# Patient Record
Sex: Male | Born: 1940
Health system: Southern US, Community
[De-identification: ages and names within clinical notes are randomized; demographics above are authoritative.]

## PROBLEM LIST (undated history)

## (undated) DIAGNOSIS — E876 Hypokalemia: Secondary | ICD-10-CM

## (undated) DIAGNOSIS — I119 Hypertensive heart disease without heart failure: Secondary | ICD-10-CM

## (undated) DIAGNOSIS — I3139 Other pericardial effusion (noninflammatory): Secondary | ICD-10-CM

## (undated) DIAGNOSIS — Z9289 Personal history of other medical treatment: Secondary | ICD-10-CM

## (undated) DIAGNOSIS — I160 Hypertensive urgency: Secondary | ICD-10-CM

## (undated) DIAGNOSIS — I639 Cerebral infarction, unspecified: Secondary | ICD-10-CM

## (undated) DIAGNOSIS — R42 Dizziness and giddiness: Secondary | ICD-10-CM

## (undated) DIAGNOSIS — K5730 Diverticulosis of large intestine without perforation or abscess without bleeding: Secondary | ICD-10-CM

## (undated) DIAGNOSIS — Z951 Presence of aortocoronary bypass graft: Secondary | ICD-10-CM

## (undated) DIAGNOSIS — R55 Syncope and collapse: Secondary | ICD-10-CM

## (undated) DIAGNOSIS — R079 Chest pain, unspecified: Secondary | ICD-10-CM

## (undated) DIAGNOSIS — E785 Hyperlipidemia, unspecified: Secondary | ICD-10-CM

## (undated) DIAGNOSIS — I10 Essential (primary) hypertension: Secondary | ICD-10-CM

## (undated) DIAGNOSIS — I214 Non-ST elevation (NSTEMI) myocardial infarction: Secondary | ICD-10-CM

## (undated) DIAGNOSIS — D45 Polycythemia vera: Secondary | ICD-10-CM

## (undated) DIAGNOSIS — K648 Other hemorrhoids: Secondary | ICD-10-CM

## (undated) DIAGNOSIS — I313 Pericardial effusion (noninflammatory): Secondary | ICD-10-CM

## (undated) DIAGNOSIS — H532 Diplopia: Secondary | ICD-10-CM

## (undated) DIAGNOSIS — I2510 Atherosclerotic heart disease of native coronary artery without angina pectoris: Secondary | ICD-10-CM

## (undated) DIAGNOSIS — H538 Other visual disturbances: Secondary | ICD-10-CM

## (undated) HISTORY — DX: Chest pain, unspecified: R07.9

## (undated) HISTORY — DX: Personal history of other medical treatment: Z92.89

## (undated) HISTORY — DX: Other hemorrhoids: K64.8

## (undated) HISTORY — DX: Hypertensive urgency: I16.0

## (undated) HISTORY — DX: Diplopia: H53.2

## (undated) HISTORY — DX: Atherosclerotic heart disease of native coronary artery without angina pectoris: I25.10

## (undated) HISTORY — DX: Hypokalemia: E87.6

## (undated) HISTORY — DX: Hyperlipidemia, unspecified: E78.5

## (undated) HISTORY — DX: Syncope and collapse: R55

## (undated) HISTORY — DX: Non-ST elevation (NSTEMI) myocardial infarction: I21.4

## (undated) HISTORY — DX: Hypertensive heart disease without heart failure: I11.9

## (undated) HISTORY — DX: Other visual disturbances: H53.8

## (undated) HISTORY — DX: Diverticulosis of large intestine without perforation or abscess without bleeding: K57.30

## (undated) HISTORY — DX: Presence of aortocoronary bypass graft: Z95.1

## (undated) HISTORY — DX: Other pericardial effusion (noninflammatory): I31.39

## (undated) HISTORY — DX: Pericardial effusion (noninflammatory): I31.3

## (undated) HISTORY — DX: Essential (primary) hypertension: I10

## (undated) HISTORY — DX: Cerebral infarction, unspecified: I63.9

## (undated) HISTORY — PX: CORONARY STENT PLACEMENT: SHX1402

## (undated) HISTORY — DX: Polycythemia vera: D45

## (undated) HISTORY — DX: Dizziness and giddiness: R42

---

## 2003-06-08 ENCOUNTER — Emergency Department (HOSPITAL_COMMUNITY): Admission: EM | Admit: 2003-06-08 | Discharge: 2003-06-08 | Payer: Self-pay | Admitting: *Deleted

## 2006-04-02 ENCOUNTER — Ambulatory Visit: Payer: Self-pay | Admitting: Cardiovascular Disease

## 2006-04-02 ENCOUNTER — Inpatient Hospital Stay (HOSPITAL_COMMUNITY): Admission: EM | Admit: 2006-04-02 | Discharge: 2006-04-05 | Payer: Self-pay | Admitting: Emergency Medicine

## 2006-04-22 ENCOUNTER — Ambulatory Visit: Payer: Self-pay | Admitting: Cardiovascular Disease

## 2007-01-21 ENCOUNTER — Emergency Department (HOSPITAL_COMMUNITY): Admission: EM | Admit: 2007-01-21 | Discharge: 2007-01-22 | Payer: Self-pay | Admitting: Emergency Medicine

## 2008-06-14 DIAGNOSIS — I2583 Coronary atherosclerosis due to lipid rich plaque: Secondary | ICD-10-CM

## 2008-06-14 DIAGNOSIS — I119 Hypertensive heart disease without heart failure: Secondary | ICD-10-CM

## 2008-06-14 DIAGNOSIS — I25118 Atherosclerotic heart disease of native coronary artery with other forms of angina pectoris: Secondary | ICD-10-CM | POA: Insufficient documentation

## 2008-06-14 DIAGNOSIS — I2510 Atherosclerotic heart disease of native coronary artery without angina pectoris: Secondary | ICD-10-CM

## 2008-06-14 DIAGNOSIS — E785 Hyperlipidemia, unspecified: Secondary | ICD-10-CM | POA: Insufficient documentation

## 2008-06-14 HISTORY — DX: Hypertensive heart disease without heart failure: I11.9

## 2008-06-14 HISTORY — DX: Hyperlipidemia, unspecified: E78.5

## 2008-06-14 HISTORY — DX: Atherosclerotic heart disease of native coronary artery without angina pectoris: I25.10

## 2008-06-15 ENCOUNTER — Ambulatory Visit: Payer: Self-pay | Admitting: Cardiovascular Disease

## 2008-06-30 ENCOUNTER — Ambulatory Visit: Payer: Self-pay | Admitting: Cardiovascular Disease

## 2008-07-08 ENCOUNTER — Telehealth: Payer: Self-pay | Admitting: Cardiovascular Disease

## 2008-07-08 LAB — CONVERTED CEMR LAB
Albumin: 3.6 g/dL (ref 3.5–5.2)
CO2: 31 meq/L (ref 19–32)
Calcium: 9.1 mg/dL (ref 8.4–10.5)
Creatinine, Ser: 0.8 mg/dL (ref 0.4–1.5)
Direct LDL: 150.8 mg/dL
GFR calc non Af Amer: 102.75 mL/min (ref >60)
Glucose, Bld: 109 mg/dL — ABNORMAL HIGH (ref 70–99)
HDL: 41.9 mg/dL (ref >39.00)
Potassium: 3.8 meq/L (ref 3.5–5.1)
Sodium: 142 meq/L (ref 135–145)
Total Bilirubin: 1.1 mg/dL (ref 0.3–1.2)
Total CHOL/HDL Ratio: 5
Total Protein: 6.8 g/dL (ref 6.0–8.3)

## 2008-08-18 ENCOUNTER — Ambulatory Visit: Payer: Self-pay | Admitting: Cardiovascular Disease

## 2008-10-11 ENCOUNTER — Ambulatory Visit: Payer: Self-pay | Admitting: Cardiovascular Disease

## 2008-10-13 LAB — CONVERTED CEMR LAB
ALT: 17 units/L (ref 0–53)
Alkaline Phosphatase: 66 units/L (ref 39–117)
Bilirubin, Direct: 0 mg/dL (ref 0.0–0.3)
HDL: 38.9 mg/dL — ABNORMAL LOW (ref >39.00)
LDL Cholesterol: 121 mg/dL — ABNORMAL HIGH (ref 0–99)

## 2009-04-12 ENCOUNTER — Ambulatory Visit: Payer: Self-pay | Admitting: Cardiovascular Disease

## 2009-04-13 ENCOUNTER — Ambulatory Visit: Payer: Self-pay | Admitting: Cardiovascular Disease

## 2009-04-20 LAB — CONVERTED CEMR LAB
AST: 16 units/L (ref 0–37)
Albumin: 3.6 g/dL (ref 3.5–5.2)
Alkaline Phosphatase: 65 units/L (ref 39–117)
Cholesterol: 175 mg/dL (ref 0–200)
Glucose, Bld: 105 mg/dL — ABNORMAL HIGH (ref 70–99)
Total Bilirubin: 0.7 mg/dL (ref 0.3–1.2)
Total CHOL/HDL Ratio: 4
Triglycerides: 97 mg/dL (ref 0.0–149.0)
VLDL: 19.4 mg/dL (ref 0.0–40.0)

## 2009-04-25 ENCOUNTER — Telehealth: Payer: Self-pay | Admitting: Cardiovascular Disease

## 2009-09-29 ENCOUNTER — Ambulatory Visit: Payer: Self-pay | Admitting: Cardiovascular Disease

## 2009-09-30 ENCOUNTER — Ambulatory Visit: Payer: Self-pay | Admitting: Cardiovascular Disease

## 2009-10-17 LAB — CONVERTED CEMR LAB
ALT: 15 units/L (ref 0–53)
AST: 17 units/L (ref 0–37)
Alkaline Phosphatase: 65 units/L (ref 39–117)
Cholesterol: 188 mg/dL (ref 0–200)
Total Bilirubin: 1.1 mg/dL (ref 0.3–1.2)
Total CHOL/HDL Ratio: 4
Total Protein: 6.6 g/dL (ref 6.0–8.3)
VLDL: 15.2 mg/dL (ref 0.0–40.0)

## 2009-11-06 ENCOUNTER — Ambulatory Visit: Payer: Self-pay | Admitting: Cardiology

## 2009-11-06 ENCOUNTER — Observation Stay (HOSPITAL_COMMUNITY): Admission: EM | Admit: 2009-11-06 | Discharge: 2009-11-07 | Payer: Self-pay | Admitting: Emergency Medicine

## 2009-11-06 ENCOUNTER — Encounter: Payer: Self-pay | Admitting: Cardiovascular Disease

## 2009-12-05 ENCOUNTER — Ambulatory Visit: Payer: Self-pay | Admitting: Cardiovascular Disease

## 2010-01-11 ENCOUNTER — Ambulatory Visit: Payer: Self-pay | Admitting: Cardiovascular Disease

## 2010-03-21 NOTE — Assessment & Plan Note (Signed)
Summary: [facility]   Visit Type:  Follow-up Primary Provider:  none  CC:  No complaints.  History of Present Illness: This is a 70 year old gentleman with history of coronary artery disease presenting for follow-up evaluation. He has three-vessel CAD with moderate diffuse LAD stenosis, small vessel disease in the right coronary artery, and severe stenosis in the left circumflex that was treated with bare-metal stents in 2009. His LVEF is preserved at 70% at time of last assessment.  He presented for an exercise treadmill study last month, but was having a lot of back pain at the time and his BP was markedly elevated so the test was cancelled. He has subsequently done well and is feeling much better at present. He denies chest pain, dyspnea, or edema. His BP has been good at home...he reports readings in the 130/70's range.    Current Medications (verified): 1)  Aspirin 81 Mg Tbec (Aspirin) .... Take One Tablet  Three Times A Week 2)  Micardis Hct 80-25 Mg Tabs (Telmisartan-Hctz) .... Take 1 Tablet By Mouth Once A Day 3)  Garlic Oil 500 Mg Caps (Garlic) .... Take 1 Capsule By Mouth Once A Day  Allergies (verified): No Known Drug Allergies  Past History:  Past medical history reviewed for relevance to current acute and chronic problems.  Past Medical History: Reviewed history from 09/29/2009 and no changes required. CAD, UNSPECIFIED SITE (ICD-414.00) - s/p PCI of the LCx - bare metal stent HYPERTENSION, UNSPECIFIED (ICD-401.9) HYPERLIPIDEMIA-MIXED (ICD-272.4)    Review of Systems       Negative except as per HPI   Vital Signs:  Patient profile:   70 year old male Height:      66 inches Weight:      174.50 pounds BMI:     28.27 Pulse rate:   68 / minute Pulse rhythm:   regular Resp:     18 per minute BP sitting:   132 / 90  (left arm) Cuff size:   large  Vitals Entered By: Vikki Ports (January 11, 2010 3:13 PM)  Physical Exam  General:  Pt is alert and  oriented, in no acute distress. HEENT: normal Neck: normal carotid upstrokes without bruits, JVP normal Lungs: CTA CV: RRR without murmur or gallop Abd: soft, NT, positive BS, no bruit, no organomegaly Ext: no clubbing, cyanosis, or edema. peripheral pulses 2+ and equal Skin: warm and dry without rash    EKG  Procedure date:  12/05/2009  Findings:      NSR 85 bpm, within normal limits.  Impression & Recommendations:  Problem # 1:  CAD, UNSPECIFIED SITE (ICD-414.00) The patient is stable without angina. He is able to exert himself without symptoms and does not wish to go forward with stress testing at this point.Marland KitchenMarland KitchenI agree that is reasonable.   His updated medication list for this problem includes:    Aspirin 81 Mg Tbec (Aspirin) .Marland Kitchen... Take one tablet  three times a week  Problem # 2:  HYPERTENSION, UNSPECIFIED (ICD-401.9)  Continue current meds.   His updated medication list for this problem includes:    Aspirin 81 Mg Tbec (Aspirin) .Marland Kitchen... Take one tablet  three times a week    Micardis Hct 80-25 Mg Tabs (Telmisartan-hctz) .Marland Kitchen... Take 1 tablet by mouth once a day  BP today: 132/90 Prior BP: 148/97 (09/29/2009)  Prior 10 Yr Risk Heart Disease: N/A (12/05/2009)  Labs Reviewed: K+: 3.8 (04/13/2009) Creat: : 0.9 (04/13/2009)   Chol: 188 (09/30/2009)   HDL: 44.50 (09/30/2009)  LDL: 128 (09/30/2009)   TG: 76.0 (09/30/2009)  Problem # 3:  HYPERLIPIDEMIA-MIXED (ICD-272.4) He has refused to take statins in the past. I had a long discussion with him today about the overall favorable risk:benefit for statin therapy in his situation with known CAD, HTN, etc. He will think things over but still wouldn't commit to taking a statin.   CHOL: 188 (09/30/2009)   LDL: 128 (09/30/2009)   HDL: 44.50 (09/30/2009)   TG: 76.0 (09/30/2009)  Patient Instructions: 1)  Your physician recommends that you continue on your current medications as directed. Please refer to the Current Medication list  given to you today. 2)  Your physician wants you to follow-up in: 6 MONTHS.  You will receive a reminder letter in the mail two months in advance. If you don't receive a letter, please call our office to schedule the follow-up appointment.

## 2010-03-21 NOTE — Assessment & Plan Note (Signed)
Summary: f80m   Visit Type:  6 months follow up Primary Provider:  none  CC:  No complaints.  History of Present Illness: This is a 70 year old gentleman with history of coronary artery disease presenting for follow-up evaluation. He has three-vessel CAD with moderate diffuse LAD stenosis, small vessel disease in the right coronary artery, and severe stenosis in the left circumflex that was treated with bare-metal stents in 2009. His LVEF is preserved at 70% at time of last assessment.  The patient continues to feel very good. He has no complaints today. He has lost about 20 pounds over the past year through diet. He is active but doesn't participate in regular exercise. The patient denies chest pain, dyspnea, orthopnea, PND, edema, palpitations, lightheadedness, or syncope.     Current Medications (verified): 1)  Aspirin 81 Mg Tbec (Aspirin) .... Take One Tablet  Three Times A Week 2)  Micardis Hct 80-25 Mg Tabs (Telmisartan-Hctz) .... Take 1 Tablet By Mouth Once A Day 3)  Garlic Oil 500 Mg Caps (Garlic) .... Take 1 Capsule By Mouth Once A Day  Allergies (verified): No Known Drug Allergies  Past History:  Past medical history reviewed for relevance to current acute and chronic problems.  Past Medical History: CAD, UNSPECIFIED SITE (ICD-414.00) - s/p PCI of the LCx - bare metal stent HYPERTENSION, UNSPECIFIED (ICD-401.9) HYPERLIPIDEMIA-MIXED (ICD-272.4)    Review of Systems       Negative except as per HPI   Vital Signs:  Patient profile:   70 year old male Height:      66 inches Weight:      172.50 pounds BMI:     27.94 Pulse rate:   68 / minute Pulse rhythm:   regular Resp:     18 per minute BP sitting:   148 / 97  (left arm) Cuff size:   large  Vitals Entered By: Vikki Ports (September 29, 2009 4:15 PM)  Physical Exam  General:  Pt is alert and oriented, in no acute distress. HEENT: normal Neck: normal carotid upstrokes without bruits, JVP normal Lungs:  CTA CV: RRR without murmur or gallop Abd: soft, NT, positive BS, no bruit, no organomegaly Ext: no clubbing, cyanosis, or edema. peripheral pulses 2+ and equal Skin: warm and dry without rash    EKG  Procedure date:  09/29/2009  Findings:      NSR 69 bpm, within normal limits.  Impression & Recommendations:  Problem # 1:  CAD, UNSPECIFIED SITE (ICD-414.00) Stable without angina. Plan exercise treadmill stress test in 6 months for follow-up. Continue current medical therapy.  His updated medication list for this problem includes:    Aspirin 81 Mg Tbec (Aspirin) .Marland Kitchen... Take one tablet  three times a week  Orders: EKG w/ Interpretation (93000)  Problem # 2:  HYPERTENSION, UNSPECIFIED (ICD-401.9) BP elevated today, but he regularly checks this at home and reports BP range 120's / 70's.  His updated medication list for this problem includes:    Aspirin 81 Mg Tbec (Aspirin) .Marland Kitchen... Take one tablet  three times a week    Micardis Hct 80-25 Mg Tabs (Telmisartan-hctz) .Marland Kitchen... Take 1 tablet by mouth once a day  Orders: EKG w/ Interpretation (93000)  BP today: 148/97 Prior BP: 164/100 (04/12/2009)  Labs Reviewed: K+: 3.8 (04/13/2009) Creat: : 0.9 (04/13/2009)   Chol: 175 (04/13/2009)   HDL: 47.80 (04/13/2009)   LDL: 108 (04/13/2009)   TG: 97.0 (04/13/2009)  Problem # 3:  HYPERLIPIDEMIA-MIXED (ICD-272.4) Unwilling to take statin drugs.  Discussed this with him again today. Follow-up lipids and lft's.  The following medications were removed from the medication list:    Simvastatin 20 Mg Tabs (Simvastatin) .Marland Kitchen... Take one tablet by mouth daily at bedtime  Orders: EKG w/ Interpretation (93000)  CHOL: 175 (04/13/2009)   LDL: 108 (04/13/2009)   HDL: 47.80 (04/13/2009)   TG: 97.0 (04/13/2009)  Patient Instructions: 1)  Your physician recommends that you return for a FASTING LIPID and LIVER Profile (414.01, 401.9)  Nothing to eat or drink after midnight--lab opens at 8:30  2)  Your  physician recommends that you continue on your current medications as directed. Please refer to the Current Medication list given to you today. 3)  Your physician has requested that you have an exercise tolerance test in 6 MONTHS.  For further information please visit [email or web address]  Please also follow instruction sheet, as given.

## 2010-03-21 NOTE — Consult Note (Signed)
Summary: [facility]   [facility]   Imported By: Roderic Ovens 12/06/2009 14:59:03  _____________________________________________________________________  External Attachment:    Type:   Image     Comment:   External Document

## 2010-03-21 NOTE — Assessment & Plan Note (Signed)
Summary: f19m   Visit Type:  6 months follow up Primary Provider:  none  CC:  High blood pressure.  History of Present Illness: This is a 70 -year-oldl gentleman with history of coronary artery disease presenting for follow-up evaluation. He has three-vessel CAD with moderate diffuse LAD stenosis, small vessel disease in the right coronary artery, and severe stenosis in the left circumflex that was treated with bare-metal stents in 2009. His LVEF is preserved at 70% at time of last assessment.  The pt is doing well - he has no complaints today. The patient denies chest pain, dyspnea, orthopnea, PND, edema, palpitations, lightheadedness, or syncope.  He has been somewhat resistant to taking medications for both BP and cholesterol.     Current Medications (verified): 1)  Aspirin 81 Mg Tbec (Aspirin) .... Take One Tablet  Three Times A Week 2)  Micardis Hct 80-25 Mg Tabs (Telmisartan-Hctz) .... Take 1 Tablet By Mouth Once A Day 3)  Garlic Oil 500 Mg Caps (Garlic) .... Take 1 Capsule By Mouth Once A Day  Allergies (verified): No Known Drug Allergies  Past History:  Past medical history reviewed for relevance to current acute and chronic problems.  Past Medical History: CAD, UNSPECIFIED SITE (ICD-414.00) - s/p PCI of the LCx HYPERTENSION, UNSPECIFIED (ICD-401.9) HYPERLIPIDEMIA-MIXED (ICD-272.4)    Review of Systems       Negative except as per HPI   Vital Signs:  Patient profile:   70 year old male Height:      66 inches Weight:      170.75 pounds BMI:     27.66 Pulse rate:   70 / minute Pulse rhythm:   regular Resp:     18 per minute BP sitting:   164 / 100  (left arm) Cuff size:   large  Vitals Entered By: Vikki Ports (April 12, 2009 3:19 PM)  Physical Exam  General:  Pt is alert and oriented, in no acute distress. HEENT: normal Neck: normal carotid upstrokes without bruits, JVP normal Lungs: CTA CV: RRR without murmur or gallop Abd: soft, NT, positive  BS, no bruit, no organomegaly Ext: no clubbing, cyanosis, or edema. peripheral pulses 2+ and equal Skin: warm and dry without rash    EKG  Procedure date:  04/12/2009  Findings:      NSR, HR 70 bpm, within normal limits  Impression & Recommendations:  Problem # 1:  CAD, UNSPECIFIED SITE (ICD-414.00) Stable without angina. Long discussoin regarding the need to control modifiable risk factors of HTN, dyslipidemia, but this did not make much of an impact. He feels well and doesn't seem to comprehend the importance of secondary risk reduction.  His updated medication list for this problem includes:    Aspirin 81 Mg Tbec (Aspirin) .Marland Kitchen... Take one tablet  three times a week  Orders: EKG w/ Interpretation (93000)  Problem # 2:  HYPERTENSION, UNSPECIFIED (ICD-401.9) Uncontrolled. As above - advised escalation of antihypertensive Rx, but he was not inclined to do this.  His updated medication list for this problem includes:    Aspirin 81 Mg Tbec (Aspirin) .Marland Kitchen... Take one tablet  three times a week    Micardis Hct 80-25 Mg Tabs (Telmisartan-hctz) .Marland Kitchen... Take 1 tablet by mouth once a day  Orders: EKG w/ Interpretation (93000)  BP today: 164/100 Prior BP: 144/90 (08/18/2008)  Labs Reviewed: K+: 3.8 (06/30/2008) Creat: : 0.8 (06/30/2008)   Chol: 174 (10/11/2008)   HDL: 38.90 (10/11/2008)   LDL: 121 (10/11/2008)   TG: 70.0 (  10/11/2008)  Problem # 3:  HYPERLIPIDEMIA-MIXED (ICD-272.4) Check lipids and LFT's - encourage a statin if lipids above goal.  Orders: EKG w/ Interpretation (93000)  Patient Instructions: 1)  Your physician recommends that you continue on your current medications as directed. Please refer to the Current Medication list given to you today. 2)  Your physician wants you to follow-up in:  6 MONTHS.  You will receive a reminder letter in the mail two months in advance. If you don't receive a letter, please call our office to schedule the follow-up appointment. 3)  Your  physician recommends that you return for a FASTING LIPID, LIVER and BMP (414.01, 272.0, 401.9)--Nothing to eat or drink after midnight

## 2010-03-21 NOTE — Progress Notes (Signed)
Summary: Lab Results  Phone Note Call from Patient Call back at Home Phone [phone]   Caller: Patient Reason for Call: Talk to Nurse, Lab or Test Results Initial call taken by: Lorne Skeens,  April 25, 2009 3:10 PM  Follow-up for Phone Call        Lab results reviewed with the pt.  The pt will start Simvastatin 20mg  once every evening.  The pt will have a lipid and liver rechecked on 07/25/09. Follow-up by: Julieta Gutting, RN, BSN,  April 25, 2009 3:40 PM    New/Updated Medications: SIMVASTATIN 20 MG TABS (SIMVASTATIN) Take one tablet by mouth daily at bedtime Prescriptions: SIMVASTATIN 20 MG TABS (SIMVASTATIN) Take one tablet by mouth daily at bedtime  #30 x 6   Entered by:   Julieta Gutting, RN, BSN   Authorized by:   Norva Karvonen, MD   Signed by:   Julieta Gutting, RN, BSN on 04/25/2009   Method used:   Electronically to        [facility]* (retail)       [address] [place], [place]  [address]       Ph: [phone]       Fax: [phone]   RxID:   [id number]

## 2010-05-04 LAB — CK TOTAL AND CKMB (NOT AT ARMC): Relative Index: 1.3 (ref 0.0–2.5)

## 2010-05-04 LAB — CBC
HCT: 43.4 % (ref 39.0–52.0)
Hemoglobin: 15.3 g/dL (ref 13.0–17.0)
Hemoglobin: 15.4 g/dL (ref 13.0–17.0)
MCH: 31.9 pg (ref 26.0–34.0)
MCH: 32.2 pg (ref 26.0–34.0)
MCHC: 34.7 g/dL (ref 30.0–36.0)
MCHC: 35.3 g/dL (ref 30.0–36.0)
MCV: 90.6 fL (ref 78.0–100.0)
Platelets: 181 10*3/uL (ref 150–400)
WBC: 9.5 10*3/uL (ref 4.0–10.5)

## 2010-05-04 LAB — POCT I-STAT, CHEM 8
BUN: 16 mg/dL (ref 6–23)
Calcium, Ion: 1.13 mmol/L (ref 1.12–1.32)
Creatinine, Ser: 1 mg/dL (ref 0.4–1.5)
Glucose, Bld: 126 mg/dL — ABNORMAL HIGH (ref 70–99)
HCT: 47 % (ref 39.0–52.0)

## 2010-05-04 LAB — COMPREHENSIVE METABOLIC PANEL
AST: 20 U/L (ref 0–37)
CO2: 32 mEq/L (ref 19–32)
Calcium: 9.5 mg/dL (ref 8.4–10.5)
Creatinine, Ser: 0.98 mg/dL (ref 0.4–1.5)
GFR calc Af Amer: >60 mL/min (ref >60)
GFR calc non Af Amer: >60 mL/min (ref >60)
Glucose, Bld: 161 mg/dL — ABNORMAL HIGH (ref 70–99)

## 2010-05-04 LAB — CARDIAC PANEL(CRET KIN+CKTOT+MB+TROPI)
CK, MB: 2 ng/mL (ref 0.3–4.0)
CK, MB: 2.1 ng/mL (ref 0.3–4.0)
Relative Index: 1.5 (ref 0.0–2.5)
Troponin I: 0.02 ng/mL (ref 0.00–0.06)
Troponin I: 0.04 ng/mL (ref 0.00–0.06)

## 2010-05-04 LAB — TROPONIN I: Troponin I: 0.01 ng/mL (ref 0.00–0.06)

## 2010-05-04 LAB — POCT CARDIAC MARKERS
Myoglobin, poc: 54.3 ng/mL (ref 12–200)
Troponin i, poc: <0.05 ng/mL (ref 0.00–0.09)

## 2010-05-04 LAB — DIFFERENTIAL
Basophils Absolute: 0.1 10*3/uL (ref 0.0–0.1)
Eosinophils Absolute: 0.7 10*3/uL (ref 0.0–0.7)
Monocytes Absolute: 0.5 10*3/uL (ref 0.1–1.0)
Neutro Abs: 4.4 10*3/uL (ref 1.7–7.7)

## 2010-05-04 LAB — D-DIMER, QUANTITATIVE: D-Dimer, Quant: 0.22 ug/mL-FEU (ref 0.00–0.48)

## 2010-05-04 LAB — PROTIME-INR: INR: 0.93 (ref 0.00–1.49)

## 2010-07-07 NOTE — Consult Note (Signed)
NAMEJASMAN, [name]            ACCOUNT NO.:  [id number]   MEDICAL RECORD NO.:  [id number]          PATIENT TYPE:  INP   LOCATION:  0104                         FACILITY:  [facility]   PHYSICIAN:  Veverly Fells. Excell Seltzer, MD  DATE OF BIRTH:  Feb 08, 1941   DATE OF CONSULTATION:  DATE OF DISCHARGE:                                 CONSULTATION   REQUESTING PHYSICIAN:  Lonia Blood, M.D.   REASON FOR CONSULTATION:  Chest pain and hypertensive urgency.   HISTORY OF PRESENT ILLNESS:  [name] is a 70 year old [place]  gentleman with a history of poorly controlled hypertension who presented  to the [facility] emergency department today because of markedly  elevated blood pressures as well as chest pain. He complains of sharp  left-sided chest pain that began this morning when he awoke. His chest  pain resolved after receiving sublingual nitroglycerin. He has not had  any recurrence of chest pain since hospital admission earlier today. He  also complains of exertional chest pain that has progressed over the  past year and occurs when he mows his lawn or does other strenuous  activities. This chest pain goes away when he rests within 5 minutes. He  denies dyspnea, nausea, vomiting, diaphoresis or other complaints. He  has been recently started on Micardis for his blood pressure within the  last month but he has continued to have elevated blood pressure readings  despite the Micardis.   PAST MEDICAL HISTORY:  1. Hypertension which has been untreated for many years with recent      attempt at control using Micardis as detailed above.  2. Dyslipidemia untreated.  3. Questionable history of nephrotic syndrome.  4. Degenerative disk disease.   MEDICATIONS:  1. Aspirin 81 mg daily.  2. Micardis 40 mg daily.   ALLERGIES:  CONTRAST MEDIA and shell fish.   SOCIAL HISTORY:  [place] is  married with 4 children. He is a retired Consulting civil engineer. He  does not  smoke cigarettes or drink alcohol.   FAMILY HISTORY:  His mother died in her 63s of heart disease. Her first  myocardial infarction was in her 62s. His father died in his 13s of a  stroke but also had coronary disease.   REVIEW OF SYSTEMS:  A complete review of systems was performed.  Pertinent positives included cough, dizziness, headache and low back  pain. All other systems were reviewed and are negative except as  detailed above.   PHYSICAL EXAMINATION:  GENERAL:  The patient is alert and oriented. He  is in no acute distress.  VITAL SIGNS:  Temperature is 99.8, heart rate is 97, respiratory rate is  18. Initial blood pressure was 188/120, followup blood pressure after  medications was 145/78, oxygen saturations 93% on 4 liters of oxygen per  nasal cannula.  HEENT:  Normal.  NECK:  Normal carotid upstrokes without bruits. Jugular venous pressure  is normal. No thyromegaly or thyroid nodules.  LUNGS:  Clear to auscultation bilaterally.  HEART:  The apex is discreet and nondisplaced. The heart is regular rate  and  rhythm with an S4 gallop. There are no murmurs. There is no right  ventricular heave or lift.  ABDOMEN:  Soft, obese, nontender, no organomegaly, no abdominal bruits.  BACK:  There is no spinal tenderness or flank tenderness.  EXTREMITIES:  No clubbing, cyanosis or edema. Peripheral pulses are 2+  and equal throughout.  NEUROLOGIC:  Alert and oriented in all spheres. Cranial nerves II-XII  are intact. Strength is 5/5 and equal in the arms and legs bilaterally.   Chest x-ray demonstrated cardiomegaly with mild bibasilar atelectasis.  Chest CT scan was negative for aortic dissection. There was a normal  caliber thoracoabdominal aorta with slightly fatty liver and a small  hiatal hernia. 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   EKG demonstrates normal sinus rhythm with a nonspecific T wave  abnormality. It is otherwise within  normal limits.   ASSESSMENT:  This is a 69 year old gentleman presenting with  hypertensive urgency and chest pain with both typical and atypical  features. The patient's cardiovascular risk factors include  hypertension, dyslipidemia and family history. In the setting of his  exertional angina which is highly typical as well as his hypertensive  urgency and nitro-responsive chest pain, I would favor a cardiac  catheterization with an abdominal aortic angiogram to rule out renal  artery stenosis. The risks and indications have been reviewed in detail  with the patient who is agreeable to proceed. Will premedicate him with  prednisone for his contrast media allergy. Will followup pending the  results of his catheterization.      Veverly Fells. Excell Seltzer, MD  Electronically Signed     MDC/MEDQ  D:  04/02/2006  T:  04/03/2006  Job:  [id number]   cc:   Lacretia Leigh. Quintella Reichert, M.D.  [facility]  [place]  [place] [address]

## 2010-07-07 NOTE — Cardiovascular Report (Signed)
NAMEJAN, OLANO NO.:  [id number]   MEDICAL RECORD NO.:  [id number]          PATIENT TYPE:  INP   LOCATION:  6525                         FACILITY:  [facility]   PHYSICIAN:  Salvadore Farber, MD  DATE OF BIRTH:  [birth year]   DATE OF PROCEDURE:  04/04/2006  DATE OF DISCHARGE:                            CARDIAC CATHETERIZATION   PROCEDURE:  Bare metal stent placement in the proximal circumflex, bare  metal stent placement in the first obtuse marginal branch, Angio-Seal  closure. Of the right common femoral arteriotomy site.   INDICATIONS:  Mr. Heldt is a 70 year old gentleman with recently  diagnosed hypertension and family history of premature atherosclerotic  disease.  He presents now with unstable angina.  He underwent diagnostic  angiography by Dr. Gala Romney yesterday evening; that demonstrated  diffuse disease within the LAD which was felt to be a poor target for  surgical bypass grafting.  He also had a 70% stenosis of the proximal  circumflex, a 90% stenosis of the first marginal,  and at least a 70%  stenosis of the ostium of the PDA.  Due to the LAD being a poor target  for bypass grafting, we elected to proceed with percutaneous  intervention on what appeared to be the culprit lesions in the  circumflex.   PROCEDURAL TECHNIQUE:  Informed consent was obtained.  The patient had  been pretreated for his dye allergy with corticosteroids and IV Pepcid.  In addition, he was preloaded with aspirin, Plavix, and high-potency  statin.   Anticoagulation was initiated with bivalirudin.  ACT was confirmed to be  greater than 225 seconds.  A  VL3.5 guide was advanced over a wire and  engaged in the ostium of the left main.  A P[place]ater wire was advanced to  the distal circumflex without difficulty.  The lesion of the OM was  directly stented using a 3.0 x 12 mm Liberte stent deployed at 16  atmospheres.  The lesion of the proximal circumflex was then also  stented using a 3.0 x 12 mm Liberte also at 16 atmospheres.  I then  postdilated both lesions using a 3.25 x 12 mm Quantum at 22 atmospheres.  The high pressure was necessary to resolve a waist in the most severely  narrowed portions of both the lesions.  Residual stenosis in the  proximal lesion was 0% and in the marginal was 10%.  Final angiography  demonstrated no dissection and TIMI-III flow distally.   The arteriotomy was then closed using an Angio-Seal device.  Complete  hemostasis was obtained.  The patient was then transferred to the  holding room in stable condition.   COMPLICATIONS:  None.   IMPRESSION/PLAN:  Successful percutaneous revascularization of two  lesions in the circumflex.  Due to his acute coronary syndrome, I  would  prefer that he remain on Plavix for a year.  At minimum, this should be  continued for 30 days.  Aspirin should be continued indefinitely.  We  will work on blood pressure control with ACE inhibitor.  Statin was  initiated.      Salvadore Farber, MD  Electronically Signed     WED/MEDQ  D:  04/04/2006  T:  04/04/2006  Job:  [id number]   cc:   Jonita Albee, M.D.

## 2010-07-07 NOTE — H&P (Signed)
[name], [name] NO.:  [id number]   MEDICAL RECORD NO.:  [id number]                   PATIENT TYPE:  EMS   LOCATION:  ED                                   FACILITY:  [facility]   PHYSICIAN:  Sandria Bales. Ezzard Standing, M.D.               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  He has been treated on and off through  [facility] since then.  He has been on some muscle  relaxants, such as Flexeril.  He has tried different oral medicines.  Apparently because of worsening pain, he represented today about 1:00 p.m.  and got a shot of Nubain, though I am not sure that I have that office  report with him.  He was sent home after a couple of hours.  He became  diaphoretic and complained of chest pain.  The family brought him to [place]  about 8:00 p.m. tonight.   Dr. Perrin Maltese did an EKG which was normal.  They did a chest x-ray and KUB,  which he was worried about possible free air.  He talked to me about it and  thought that he had abdominal pain.  I told him to send him to the emergency  room at [facility].   By the time that I saw the patient he really had no abdominal pain or  abdominal symptoms.  He was still uncomfortable in his low back, which has  kind of been his chronic symptom now for two months, again, related to  possibly a strained back.  He is actually up for an MRI in the next four  days, but he had no nausea or vomiting.  He had no fever.  He denied any  history of peptic ulcer disease, liver disease, pancreatic disease, colon  disease.  He has never had any surgery nor has he had any endoscopy.  For  his back he has been taking only the medicines prescribed  through [facility].  He denies any history of over the counter nonsteroidals, or  BCs or Goody's.   PAST MEDICAL HISTORY:  He has no allergies.  He is unsure of his current  medications.  He says that he has a medicine for hypertension, which by the  records that I have from [place], is probably lisinopril.  He takes a muscle  relaxant, which I think is Flexeril, and he uses some other medicines.  Again, he does not have his medicines with him.   REVIEW OF SYSTEMS:  NEUROLOGIC:  No seizure, loss of consciousness.  PULMONARY:  No history of pneumonia  or tuberculosis.  He does not smoke  cigarettes.  CARDIAC:  He has had no chest pain or angina, though he has had  hypertension, which seems to be a little bit exacerbated whenever he has his  back pains.  GASTROINTESTINAL:  See history of present illness.  UROLOGIC:  __________ kidney infection.  Again, he works with maintenance at [employer] and he is accompanied in the emergency room by his wife and two  sons.   PHYSICAL EXAMINATION:  VITAL SIGNS:  Temperature 97.3; blood pressure  178/108; pulse 85; respirations 20.  GENERAL:  He is warm, dry, reasonably comfortable and again on repeat  questioning, he has really had no peritoneal signs.  He is a well-nourished,  Hispanic appearing male.  HEENT:  Unremarkable.  NECK:  Supple.  I feel no masses or thyromegaly.  LUNGS:  Clear to auscultation.  HEART:  Regular rate and rhythm without murmur or rub.  ABDOMEN:  Actually on my exam, he has no pain or tenderness in either  quadrant.  He has active bowel sounds.  He has no hernia or organomegaly or  mass.  EXTREMITIES:  He has good strength in his upper extremities, though he  really has trouble sitting up out of bed, again from his low back pain.  I  think this is clearly his limiting symptom right now.   I took the KUB that was sent with him from Dr. Ernestene Mention office and chest x-  ray and reviewed this with Dr. Ruel Favors.  He and I saw no evidence of  pneumoperitoneal free air.  It looks like a large gaseous bubble in his left  upper quadrant.  His chest x-ray was otherwise unremarkable.   IMPRESSION:  1. [name] does not have an acute abdomen.  He has no physical     evidence, no x-ray evidence and certainly clinically no evidence of acute     abdomen.  His white blood count was 10,600, which is mildly elevated.   I told him that I thought that he needed no further diagnostic tests from my  standpoint.  He is going to be back in touch with Dr. Ernestene Mention office in the  morning to discuss his lower back pain.  He looks like he already has the  MRI ordered, so he is heading in the right direction.   1. EKG today, which appeared entirely normal.  Actually I reviewed this with     Dr. Mariel Aloe.  I do not think that there is a reason to do any     further diagnostic testing on his heart at this time, unless his chest     symptoms recur.   1. Recent dose of Nubain, which I am assuming caused a lot of his symptoms     that he complained of today, though he clearly is much better now.   1. Significant lower back pain, possibly secondary to nerve entrapment.  He     is planning an MRI in the next three or four days, which seems     appropriate, and he will need to follow-up with this through Dr. Ernestene Mention     office.   1. Hypertension.  Again, because of his pain it is hard to know how well or     poorly controlled this is and this may need addressing once they get his     back worked out.  Sandria Bales. Ezzard Standing, M.D.    DHN/MEDQ  D:  06/09/2003  T:  06/09/2003  Job:  [id number]   cc:   Jonita Albee, M.D.  [facility]  [address]  [place]  [place] [address]  Fax: [phone]

## 2010-07-07 NOTE — Assessment & Plan Note (Signed)
[facility]                            CARDIOLOGY OFFICE NOTE   [name], [name]                     MRN:          [id number]  DATE:04/22/2006                            DOB:          [birth year]    [name] presents as an outpatient to the [facility]  on April 22, 2006.  He is here in hospital follow up after a recent  hospitalization from April 03, 2006 through April 05, 2006.  Mr.  [name] was admitted with hypertensive urgency and unstable angina.  He had a history of uncontrolled hypertension and subsequently developed  substernal chest pain.  He ruled out for myocardial infarction, but in  the setting of his suggestive symptoms, was referred for diagnostic  catheterization.  His cardiac catheterization demonstrated three vessel  coronary artery disease with a diffusely diseased LAD, high grade focal  disease involving serial lesions in the left circumflex, and disease at  the ostium of the right PDA.  His left ventricular function was assessed  by left ventriculography and was normal with an left ventricular  ejection fraction of 70%.  He ultimately underwent bare metal stenting  on April 04, 2006 of 2 lesions in the left circumflex with a 3.0 x 12  mm Liberte stent in the first OM, and a 3.0 x 12 Liberte stent in the  proximal circumflex.   Since discharge home, [name] has been doing fairly well.  He  denies chest pain, dyspnea, lightheadedness, orthopnea, edema, PND,  palpitations or syncope.  He has been trying to increase his activity  level, but has not been engaged in any formal exercise.  His biggest  difficulty since return home has been dealing with the cost of his  hospitalization and medications   CURRENT MEDICATIONS:  1. Plavix 75 mg daily.  2. Micardis HCT 80/25 mg daily.  3. Hydrochlorothiazide 12.5 mg daily.  4. Simvastatin 80 mg daily.  5. Aspirin 325 mg daily.   PHYSICAL  EXAMINATION:  GENERAL:  [name] is alert and oriented.  He is in no acute distress.  VITAL SIGNS:  His weight is 198 pounds.  Blood pressure is 128/98, heart  rate is 83, respiratory rate 12.  HEENT:  Normal.  NECK:  Normal carotid upstrokes without bruits.  Jugular venous pressure  is normal.  LUNGS:  Clear to auscultation bilaterally.  HEART:  Regular rate and rhythm without murmurs or gallops.  ABDOMEN:  Soft, nontender.  No organomegaly.  EXTREMITIES:  No clubbing, cyanosis, or edema.  Peripheral pulses are 2+  and equal throughout.   ELECTROCARDIOGRAM:  Normal sinus rhythm and is within normal limits.   ASSESSMENT:  [name] is currently stable from a cardiac  standpoint.  His problems are as follows:  1. Coronary artery disease.  As detailed above, he has 3 vessel      disease and was treated percutaneously with stenting in the left      circumflex.  His other lesions involved a few segments and areas      that are not amenable to PCI.  Will continue with aggressive  medical therapy involving treatment of his hypertension, as well as      high-dose statin therapy.  As the cost of medicine is a major      issue, it would be acceptable for him to discontinue his Plavix      after the end of 30 days.  He should stay on aspirin life long.  2. Hypertension.  He continues to have diastolic hypertension.  He is      having difficulty obtaining medication due to cost.  I switched him      to lisinopril/HCT 20/25 mg daily.  I also started him on metoprolol      25 mg b.i.d.  I am planning on bringing [name] back in 8      weeks for follow up to recheck his blood pressure, and he will      likely require continued titration of his antihypertensive therapy.  3. Dyslipidemia.  Continue simvastatin 80 mg daily.  Eight week follow      up lipids and LFT's.   As described, I will plan on seeing 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  If  he has problems in the  interim, he was advised to contact our office.     Veverly Fells. Excell Seltzer, MD  Electronically Signed    MDC/MedQ  DD: 04/23/2006  DT: 04/23/2006  Job #: [id number]

## 2010-07-07 NOTE — Discharge Summary (Signed)
NAMEBRACK, SHADDOCK NO.:  [id number]   MEDICAL RECORD NO.:  [id number]          PATIENT TYPE:  INP   LOCATION:  6525                         FACILITY:  [facility]   PHYSICIAN:  Salvadore Farber, MD  DATE OF BIRTH:  November 06, 1940   DATE OF ADMISSION:  04/03/2006  DATE OF DISCHARGE:  04/05/2006                               DISCHARGE SUMMARY   PRINCIPAL DIAGNOSIS:  Unstable angina/coronary artery disease.   SECONDARY DIAGNOSES:  1. Poorly controlled hypertension.  2. Hyperlipidemia.  3. History of back surgery x2.   FAMILY HISTORY:  Coronary artery disease.   ALLERGIES:  NO KNOWN DRUG ALLERGIES.   PROCEDURES:  Left heart cardiac catheterization and subsequent PCI and  stenting of the proximal left circumflex and first obtuse marginal with  bare metal stents in each vessel.   HISTORY OF PRESENT ILLNESS:  A 70 year old Hispanic male with prior  history of uncontrolled hypertension.  There was no prior history of  coronary artery disease.  He was in his usual state of health until  April 02, 2006, when after waking up that morning, developed chest  pain which radiated to the left arm.  The pain worsened throughout the  day.  He finally presented to the [facility] emergency room for  evaluation.  In the ED, he continues to complain of pain radiating to  his back, and a CT scan was performed which was negative for dissection.  He did intermittently drop his blood pressure in the ED following IV  morphine; however, he was fluid resuscitated successfully.  He was  admitted for further evaluation, and we were consulted.   HOSPITAL COURSE:  Mr. Monrreal ruled out for MI.  He underwent left  heart cardiac catheterization on February 13, revealing normal LV  function with a diffusely diseased LAD, a 99% stenosis, and a small left  circumflex, a 70-80% stenosis in the proximal first obtuse marginal with  a 90% stenosis just after that, and there was a 30% long  stenosis in the  proximal RCA and a 90% stenosis in the proximal PDA.  After review with  Dr. Juanda Chance, decision was made to pursue treatment of the left circumflex  and obtuse marginal.  The patient was started on Plavix and taken back  to the cath lab on February 14 where the proximal left circumflex was  treated with a 3.0 x 12-mm Liberte bare metal stent.  The first obtuse  marginal was treated with a 3.0 x 12 mm Liberte bare mental stent.  His  other disease was felt to be medically manageable.  Postprocedure he did  have some bleeding with a small hematoma in the left groin which was  managed with manual compression as well as with injection of epinephrine  and lidocaine.  This morning, he has been ambulating without recurrent  discomfort.  He will be discharged home today in satisfactory condition.   DISCHARGE LABS:  Hemoglobin 14.9, hematocrit 43.3, WBC 9.7, platelets  209, MCV 90.5.  Sodium 137, potassium 3.9, chloride 103, CO2 27, BUN 16,  creatinine 1.09, glucose 108, PTT 29, PT 13.9, INR 1.1,  CK 319, MB 4.1,  total cholesterol 193, triglycerides 76, HDL 39, LDL 139, calcium 8.8,  BNP 32.6.  TSH 1.156, D-dimer 0.22.   DISPOSITION:  The patient is being discharged home today in good  condition.   FOLLOW-UP PLANS AND APPOINTMENTS:  He has a followup appointment with  Dr. Tonny Bollman on March 3 at 3 p.m.  He is asked to follow up with  his primary care Desiraye Rolfson, Dr. Perrin Maltese, in 2-3 weeks.   DISCHARGE MEDICATIONS:  1. Micardis 40 mg daily.  2. HCTZ 12.5 mg daily.  3. Zocor 80 mg q.p.m.  4. Aspirin 325 mg daily.  5. Plavix 75 mg daily.  6. Nitroglycerin 0.4 mg sublingual p.r.n. chest pain.   OUTSTANDING LAB STUDIES:  None.   DURATION DISCHARGE ENCOUNTER:  40 minutes including physician time.      Nicolasa Ducking, ANP      Salvadore Farber, MD  Electronically Signed    CB/MEDQ  D:  04/05/2006  T:  04/05/2006  Job:  [id number]   cc:   Jonita Albee, M.D.

## 2010-07-07 NOTE — H&P (Signed)
NAMEMECHEL, SCHUTTER            ACCOUNT NO.:  [id number]   MEDICAL RECORD NO.:  [id number]          PATIENT TYPE:  INP   LOCATION:  1424                         FACILITY:  [facility]   PHYSICIAN:  Lonia Blood, M.D.       DATE OF BIRTH:  June 07, 1940   DATE OF ADMISSION:  04/02/2006  DATE OF DISCHARGE:                              HISTORY & PHYSICAL   The patient's primary care physician is Dr. Feliciana Rossetti   CHIEF COMPLAINTS:  Chest pain.   HISTORY OF PRESENT ILLNESS:  Mr. Zollner is a 70 year old gentleman,  with history of uncontrolled hypertension, who presented to [facility]  Emergency Room after he woke up this morning with retrosternal chest  pain radiating to his left arm.  The patient reports that the pain got  worse today to a point where he had to call his friend to drive him to  the emergency room.  In the emergency room, when he arrived, he had  extremely elevated blood pressure level with significant chest pain  radiating into the back and a stabilizing team in the emergency room  worried about possibility about the possibility of an aortic dissection.  The patient received intravenous morphine and intravenous nitroglycerin  and he dropped promptly his blood pressure to systolic of 70.  He  received intravenous normal saline and his blood pressure came up to  like 120.  The patient was taken to the computer tomography scanner  where he had an emergent CT which was negative for aortic dissection.  Currently, the patient does not have any chest pain.  Upon further  talking to the patient, he reports that he does not have any prior  cardiac history, that his lipids are fine and that he just started  blood pressure medications 2 weeks ago.  According to the patient's  family, he has been complaining at times of chest pain radiating to his  left arm but he always brushed it off.   PAST MEDICAL HISTORY:  Lower back pain status post two surgeries and  hypertension.   MEDICATIONS:  Micardis 40 mg daily, aspirin 81 mg daily.   FAMILY HISTORY:  Positive for coronary artery disease in the patient's  mother, the patient's father and also the patient's sister.   SOCIAL HISTORY:  The patient does not drink alcohol.  Does not smoke  cigarettes.  He is currently retired and married.  He has a daughter at  the bedside.   DRUG ALLERGIES:  No known drug allergies.   REVIEW OF SYSTEMS:  Positive for headache.  Positive for some mild  abdominal discomfort.  Negative for dyspnea.  Positive for chronic low  back pain.  Other systems as per HPI.  All other systems are negative.   PHYSICAL EXAMINATION:  On admission, temperature 99.8, pulse 152,  respirations 27, blood pressure 167/108, saturation 96% on room air.  GENERAL APPEARANCE:  An obese gentleman in no acute distress lying on  stretcher; alert and oriented to place, person and time.  His head  appears normocephalic, atraumatic with eyes pupils equal, round and  react to light and accommodation.  Extraocular movements intact.  Throat  is clear.  Neck is supple.  No JVD, no carotid bruits.  CHEST:  Clear to auscultation bilaterally without wheeze, rhonchi or  crackles.  HEART:  Reveals regular rate and rhythm without murmurs, rubs or gallop.  ABDOMEN:  The patient's abdomen is soft, obese, nontender.  Bowel sounds  are present.  No palpable hepatosplenomegaly.  LOWER EXTREMITIES:  Have +1 bilateral edema.  SKIN:  Warm and dry without any suspicious rashes.  NEUROLOGICAL EXAM:  Is nonfocal.   LABORATORY VALUES:  On admission, white blood cell count 8.5, hemoglobin  15.5, platelet count 215.  Sodium 137, potassium 3.8, chloride 103,  bicarb 26, BUN 7, creatinine 0.9, glucose 115.  EKG shows sinus  tachycardia.  No ST-T changes.  D-dimer was 0.22.   ASSESSMENT/PLAN:  Chest pain, most likely etiology is unstable angina.  Another possibility is esophageal reflux disease but this is less  likely.  The  question is  - does this patient have unstable angina  because of coronary artery disease or does he have it because of  hypertensive emergency.  For now though the plan is to admit the patient  to acute care unit to [facility], start him on Lovenox full  dose, metoprolol as well as aspirin.  Cardiology consultation was  already obtained.      Lonia Blood, M.D.  Electronically Signed     SL/MEDQ  D:  04/02/2006  T:  04/03/2006  Job:  [id number]

## 2010-07-07 NOTE — Cardiovascular Report (Signed)
NAMEABYAN, CADMAN            ACCOUNT NO.:  [id number]   MEDICAL RECORD NO.:  [id number]          PATIENT TYPE:  INP   LOCATION:  6527                         FACILITY:  [facility]   PHYSICIAN:  Bevelyn Buckles. Bensimhon, MDDATE OF BIRTH:  Jan 03, 1941   DATE OF PROCEDURE:  04/03/2006  DATE OF DISCHARGE:                            CARDIAC CATHETERIZATION   INDICATIONS:  Mr. Ozburn is a 70 year old male with a history of  severe hypertension.  He was admitted with chest pain. CT was negative  for dissection.  Thus, he is brought to the catheterization lab for  diagnostic angiography.  EKG and cardiac markers have been negative.  Given his contrast dye allergy, he was prepped in the routine fashion.   PROCEDURES PERFORMED:  1. Selective coronary angiography.  2. Left heart cath.  3. Left ventriculogram.  4. Abdominal aortogram.  5. AngioSeal femoral closure.   DESCRIPTION OF PROCEDURE:  The risks and benefits of catheterization  were explained, consent was signed and placed on the chart.  A 6-French  sheath was placed in the right femoral artery using a modified Seldinger  technique.  Standard catheters including JL-4, JR-4, and angled pigtail  were used for the procedure. All catheter exchanges were made over a  wire.  There were no apparent complications.   Central aortic pressure is 173/92 with a mean of 127.  LV pressure is  163/0 with an EDP of 12.  There was no aortic stenosis.   The left main had a mild irregularity.   The LAD was a long vessel coursing to the apex.  It gave off a small  first diagonal and a medium sized second diagonal.  The LAD was  diffusely diseased throughout its course.  There was a 50% ostial  lesion, a diffuse long 80% proximal lesion, and distally was diffusely  diseased at 60-70%.   The left circumflex was made up primarily of a large branching OM1.  There was a 99% lesion in the small AV groove circumflex right after the  takeoff of the OM1. In  the OM1, there was a tandem 70-80% lesion  proximally and a 90% lesion more distally.   The right coronary artery was a dominant vessel.  It had diffuse mild  disease of 30% proximally.  It gave off a moderate sized PDA and two  posterolaterals. In the ostium of the PDA, there was a 90% focal lesion.   The left ventriculogram was done in the RAO position.  EF was a bit hard  to determine due to ectopy, it appeared to be a 70% with no obvious wall  motion abnormalities.   Abdominal aortogram was done due to severe hypertension and to rule out  renal artery stenosis and showed a normal abdominal aorta with patent  renal arteries bilaterally.   ASSESSMENT:  1. Three vessel coronary artery disease as described above with a      diffusely diseased LAD without good targets.  2. Normal LV function.  3. Severe hypertension with normal renal arteries.   PLAN/DISCUSSION:  I have reviewed the films with Dr. Juanda Chance.  We will  plan  a percutaneous intervention on both the left circumflex lesions  tomorrow.  He will be started on Plavix tonight.      Bevelyn Buckles. Bensimhon, MD  Electronically Signed     DRB/MEDQ  D:  04/03/2006  T:  04/03/2006  Job:  [id number]

## 2010-08-12 ENCOUNTER — Other Ambulatory Visit: Payer: Self-pay | Admitting: Cardiovascular Disease

## 2010-11-27 LAB — DIFFERENTIAL
Basophils Absolute: 0
Basophils Relative: 1
Eosinophils Relative: 5
Lymphocytes Relative: 27
Neutro Abs: 4.8

## 2010-11-27 LAB — BASIC METABOLIC PANEL
BUN: 15
Calcium: 9
GFR calc non Af Amer: >60
Glucose, Bld: 129 — ABNORMAL HIGH

## 2010-11-27 LAB — URINALYSIS, ROUTINE W REFLEX MICROSCOPIC
Bilirubin Urine: NEGATIVE
Ketones, ur: NEGATIVE
Nitrite: NEGATIVE
Protein, ur: NEGATIVE
Urobilinogen, UA: 0.2

## 2010-11-27 LAB — CBC
Platelets: 201
RDW: 14.1

## 2011-03-24 ENCOUNTER — Ambulatory Visit (INDEPENDENT_AMBULATORY_CARE_PROVIDER_SITE_OTHER): Payer: Medicare Other | Admitting: Family Medicine

## 2011-03-24 ENCOUNTER — Encounter: Payer: Self-pay | Admitting: Family Medicine

## 2011-03-24 VITALS — BP 201/113 | HR 60 | Temp 97.7°F | Resp 16 | Ht 66.0 in | Wt 179.0 lb

## 2011-03-24 DIAGNOSIS — H6090 Unspecified otitis externa, unspecified ear: Secondary | ICD-10-CM

## 2011-03-24 DIAGNOSIS — I10 Essential (primary) hypertension: Secondary | ICD-10-CM

## 2011-03-24 DIAGNOSIS — H60339 Swimmer's ear, unspecified ear: Secondary | ICD-10-CM

## 2011-03-24 LAB — COMPREHENSIVE METABOLIC PANEL
ALT: 18 U/L (ref 0–53)
AST: 18 U/L (ref 0–37)
Albumin: 4.5 g/dL (ref 3.5–5.2)
Alkaline Phosphatase: 82 U/L (ref 39–117)
BUN: 15 mg/dL (ref 6–23)
CO2: 25 mEq/L (ref 19–32)
Calcium: 9.5 mg/dL (ref 8.4–10.5)
Chloride: 104 mEq/L (ref 96–112)
Creat: 0.84 mg/dL (ref 0.50–1.35)
Glucose, Bld: 101 mg/dL — ABNORMAL HIGH (ref 70–99)
Potassium: 4 mEq/L (ref 3.5–5.3)
Sodium: 139 mEq/L (ref 135–145)
Total Bilirubin: 0.7 mg/dL (ref 0.3–1.2)
Total Protein: 7.4 g/dL (ref 6.0–8.3)

## 2011-03-24 LAB — LIPID PANEL
Cholesterol: 206 mg/dL — ABNORMAL HIGH (ref 0–200)
HDL: 50 mg/dL (ref >39)
LDL Cholesterol: 133 mg/dL — ABNORMAL HIGH (ref 0–99)
Total CHOL/HDL Ratio: 4.1 Ratio
Triglycerides: 115 mg/dL (ref <150)
VLDL: 23 mg/dL (ref 0–40)

## 2011-03-24 MED ORDER — CIPROFLOXACIN HCL 500 MG PO TABS: 500.0000 mg | ORAL_TABLET | Freq: Two times a day (BID) | ORAL | Status: AC

## 2011-03-24 MED ORDER — LOSARTAN POTASSIUM-HCTZ 100-12.5 MG PO TABS: 1.0000 | ORAL_TABLET | Freq: Every day | ORAL | Status: DC

## 2011-03-24 NOTE — Patient Instructions (Addendum)
For financial problems:  Call 211  Use drops:  Rubbing alcohol/white vinegar 50/50 2 - 3- drops three times a day    Otitis Externa Otitis externa ("swimmer's ear") is a germ (bacterial) or fungal infection of the outer ear canal (from the eardrum to the outside of the ear). Swimming in dirty water may cause swimmer's ear. It also may be caused by moisture in the ear from water remaining after swimming or bathing. Often the first signs of infection may be itching in the ear canal. This may progress to ear canal swelling, redness, and pus drainage, which may be signs of infection. HOME CARE INSTRUCTIONS   Apply the antibiotic drops to the ear canal as prescribed by your doctor.   This can be a very painful medical condition. A strong pain reliever may be prescribed.   Only take over-the-counter or prescription medicines for pain, discomfort, or fever as directed by your caregiver.   If your caregiver has given you a follow-up appointment, it is very important to keep that appointment. Not keeping the appointment could result in a chronic or permanent injury, pain, hearing loss and disability. If there is any problem keeping the appointment, you must call back to this facility for assistance.  PREVENTION   It is important to keep your ear dry. Use the corner of a towel to wick water out of the ear canal after swimming or bathing.   Avoid scratching in your ear. This can damage the ear canal or remove the protective wax lining the canal and make it easier for germs (bacteria) or a fungus to grow.   You may use ear drops made of rubbing alcohol and vinegar after swimming to prevent future "swimmer's ear" infections. Make up a small bottle of equal parts white vinegar and alcohol. Put 3 or 4 drops into each ear after swimming.   Avoid swimming in lakes, polluted water, or poorly chlorinated pools.  SEEK MEDICAL CARE IF:   An oral temperature above 102 F (38.9 C) develops.   Your ear is  still painful after 3 days and shows signs of getting worse (redness, swelling, pain, or pus).  MAKE SURE YOU:   Understand these instructions.   Will watch your condition.   Will get help right away if you are not doing well or get worse.  Document Released: 02/05/2005 Document Revised: 10/18/2010 Document Reviewed: 09/12/2007 [facility] Patient Information 2012 [place], [place].

## 2011-03-24 NOTE — Progress Notes (Signed)
71 year old gentleman presents with several issues. First of all his right ear has been bothering discharge. This in several weeks and has gotten worse. Has been using is here and he has a crusty exudate exudative area there. In addition patient has run out of his medication could not afford it. Some financial stresses with a number of bills can do at the same time.  He requested his cholesterol rechecked today.  Review of systems including chest abdomen and extremities is negative.  Social: Patient is originally [place] but has moved here 13 years ago from [place].  Objective: HEENT is negative except for a purulent green-yellow discharge in the right ear    Chest is clear heart is regular without murmur abdomen soft nontender without HSM or masses.  Assessment: Otitis externa, hypertension, need for cholesterol check.

## 2011-09-10 ENCOUNTER — Ambulatory Visit (INDEPENDENT_AMBULATORY_CARE_PROVIDER_SITE_OTHER): Payer: Medicare Other | Admitting: Family Medicine

## 2011-09-10 VITALS — BP 170/90 | HR 58 | Temp 97.6°F | Resp 16 | Ht 66.0 in | Wt 176.0 lb

## 2011-09-10 DIAGNOSIS — I10 Essential (primary) hypertension: Secondary | ICD-10-CM

## 2011-09-10 DIAGNOSIS — Z0000 Encounter for general adult medical examination without abnormal findings: Secondary | ICD-10-CM

## 2011-09-10 LAB — POCT CBC
Granulocyte percent: 62.6 %G (ref 37–80)
HCT, POC: 50.8 % (ref 43.5–53.7)
Hemoglobin: 15.8 g/dL (ref 14.1–18.1)
Lymph, poc: 2.8 (ref 0.6–3.4)
MCH, POC: 30.2 pg (ref 27–31.2)
MCHC: 31.1 g/dL — AB (ref 31.8–35.4)
MCV: 97.2 fL — AB (ref 80–97)
MID (cbc): 0.5 (ref 0–0.9)
MPV: 8.9 fL (ref 0–99.8)
POC Granulocyte: 5.6 (ref 2–6.9)
POC LYMPH PERCENT: 31.3 %L (ref 10–50)
POC MID %: 6.1 %M (ref 0–12)
Platelet Count, POC: 242 10*3/uL (ref 142–424)
RBC: 5.23 M/uL (ref 4.69–6.13)
RDW, POC: 14.9 %
WBC: 9 10*3/uL (ref 4.6–10.2)

## 2011-09-10 LAB — POCT URINALYSIS DIPSTICK
Bilirubin, UA: NEGATIVE
Blood, UA: NEGATIVE
Glucose, UA: NEGATIVE
Ketones, UA: NEGATIVE
Leukocytes, UA: NEGATIVE
Nitrite, UA: NEGATIVE
Protein, UA: NEGATIVE
Spec Grav, UA: 1.02
Urobilinogen, UA: 0.2
pH, UA: 6

## 2011-09-10 LAB — POCT UA - MICROSCOPIC ONLY
Bacteria, U Microscopic: NEGATIVE
Casts, Ur, LPF, POC: NEGATIVE
Crystals, Ur, HPF, POC: NEGATIVE
Epithelial cells, urine per micros: NEGATIVE
Mucus, UA: POSITIVE
Yeast, UA: NEGATIVE

## 2011-09-10 MED ORDER — TELMISARTAN-HCTZ 80-12.5 MG PO TABS: 1.0000 | ORAL_TABLET | Freq: Every day | ORAL | Status: DC

## 2011-09-10 NOTE — Patient Instructions (Addendum)
Advance Directives (My Voice, My Choice) Advance directives are a means for you to make choices about your health care. It is a way that you may accept or refuse medical treatment if you cannot speak for yourself. An advance directive gives you a way to express your wishes about treatment choices in the event that you cannot speak for yourself. These directives protect your right to make your own health care choices. Some examples of advance directives would be:  A living will is a prepared document that designates your wishes in the event of a serious illness when you cannot care for yourself.   A patient advocate designation for health care means you choose someone who knows your wishes and can speak for you, on your behalf, should you not be able to do so yourself. This is often a close friend or family member.   Think about what is important for you in your life. To what extent do you want machines to keep you alive? How much pain are you willing to accept?   Decide what types of life-sustaining treatments you would or would not want.   Name a person to be your advocate who understands all your wishes and is willing and able to carry them out.   A durable power of attorney for health care is a formal legal agreement with an attorney or legal representative who will be bound to carry out your wishes in the event you are unable to care for or represent yourself. This should be someone you trust to make important medical decisions for you.   Do Not Resuscitate (DNR) is a request to do nothing in the event that your heart stops. A DNR order is used if you are very ill and not expected to recover. DNR orders are accepted by nearly all caregivers and hospitals.  Most caregiver's offices and hospitals have advance directive forms you can use. You may cancel or change these documents at any time. You must be mentally sound and able to communicate your wishes at the time you fill out these  forms. Regardless of how you let your final wishes be known in the event of a terminal illness, make sure you discuss them with your family and friends. Copies should be given to your caregiver, your hospital, your advocate or attorney, and to significant others. If you travel, you may want to find out what is legal and binding in the states where you will be. Laws vary from state to state. Document Released: 04/16/2001 Document Revised: 01/25/2011 Document Reviewed: 10/14/2007 Exit[employer] Patient Information 2012 [employer]. 

## 2011-09-11 LAB — COMPREHENSIVE METABOLIC PANEL
ALT: 14 U/L (ref 0–53)
AST: 17 U/L (ref 0–37)
Albumin: 4.2 g/dL (ref 3.5–5.2)
Alkaline Phosphatase: 75 U/L (ref 39–117)
BUN: 18 mg/dL (ref 6–23)
CO2: 30 mEq/L (ref 19–32)
Calcium: 9.7 mg/dL (ref 8.4–10.5)
Chloride: 102 mEq/L (ref 96–112)
Creat: 0.91 mg/dL (ref 0.50–1.35)
Glucose, Bld: 92 mg/dL (ref 70–99)
Potassium: 4.5 mEq/L (ref 3.5–5.3)
Sodium: 140 mEq/L (ref 135–145)
Total Bilirubin: 0.7 mg/dL (ref 0.3–1.2)
Total Protein: 7.3 g/dL (ref 6.0–8.3)

## 2011-09-11 LAB — LIPID PANEL
Cholesterol: 202 mg/dL — ABNORMAL HIGH (ref 0–200)
HDL: 46 mg/dL (ref >39)
LDL Cholesterol: 128 mg/dL — ABNORMAL HIGH (ref 0–99)
Total CHOL/HDL Ratio: 4.4 Ratio
Triglycerides: 140 mg/dL (ref <150)
VLDL: 28 mg/dL (ref 0–40)

## 2011-09-11 NOTE — Progress Notes (Signed)
@[email or web address] @  Patient ID: [name] MRN: [id number], DOB: [birth year] 71 y.o. Date of Encounter: 09/11/2011, 1:47 PM  Primary Physician: No primary provider on file.  Chief Complaint: Physical (CPE)  HPI: 71 y.o. y/o male with history noted below here for CPE.  Doing well. No issues/complaints.  Review of Systems: Consitutional: No fever, chills, fatigue, night sweats, lymphadenopathy, or weight changes. Eyes: No visual changes, eye redness, or discharge. ENT/Mouth: Ears: No otalgia, tinnitus, hearing loss, discharge. Nose: No congestion, rhinorrhea, sinus pain, or epistaxis. Throat: No sore throat, post nasal drip, or teeth pain. Cardiovascular: No CP, palpitations, diaphoresis, DOE, edema, orthopnea, PND. Respiratory: No cough, hemoptysis, SOB, or wheezing. Gastrointestinal: No anorexia, dysphagia, reflux, pain, nausea, vomiting, hematemesis, diarrhea, constipation, BRBPR, or melena. Genitourinary: No dysuria, frequency, urgency, hematuria, incontinence, nocturia, decreased urinary stream, discharge, impotence, or testicular pain/masses. Musculoskeletal: No decreased ROM, myalgias, stiffness, joint swelling, or weakness. Skin: No rash, erythema, lesion changes, pain, warmth, jaundice, or pruritis. Neurological: No headache, dizziness, syncope, seizures, tremors, memory loss, coordination problems, or paresthesias. Psychological: No anxiety, depression, hallucinations, SI/HI. Endocrine: No fatigue, polydipsia, polyphagia, polyuria, or known diabetes. All other systems were reviewed and are otherwise negative.  No past medical history on file.   No past surgical history on file.  Home Meds:  Prior to Admission medications   Medication Sig Start Date End Date Taking? Authorizing Provider  losartan-hydrochlorothiazide (HYZAAR) 100-12.5 MG per tablet Take 1 tablet by mouth daily. 03/24/11 03/23/12  Elvina Sidle, MD  telmisartan-hydrochlorothiazide (MICARDIS HCT) 80-12.5 MG per  tablet Take 1 tablet by mouth daily. 09/10/11 09/09/12  Elvina Sidle, MD    Allergies: No Known Allergies  History   Social History  . Marital Status: Married    Spouse Name: N/A    Number of Children: N/A  . Years of Education: N/A   Occupational History  . Not on file.   Social History Main Topics  . Smoking status: Never Smoker   . Smokeless tobacco: Not on file  . Alcohol Use: Not on file  . Drug Use: Not on file  . Sexually Active: Not on file   Other Topics Concern  . Not on file   Social History Narrative  . No narrative on file    No family history on file.  Physical Exam: Blood pressure 170/90, pulse 58, temperature 97.6 F (36.4 C), resp. rate 16, height 5\' 6"  (1.676 m), weight 176 lb (79.833 kg), SpO2 96.00%.  General: Well developed, well nourished, in no acute distress. HEENT: Normocephalic, atraumatic. Conjunctiva pink, sclera non-icteric. Pupils 2 mm constricting to 1 mm, round, regular, and equally reactive to light and accomodation. EOMI. Internal auditory canal clear. TMs with good cone of light and without pathology. Nasal mucosa pink. Nares are without discharge. No sinus tenderness. Oral mucosa pink. Dentition good. Pharynx without exudate.   Neck: Supple. Trachea midline. No thyromegaly. Full ROM. No lymphadenopathy. Lungs: Clear to auscultation bilaterally without wheezes, rales, or rhonchi. Breathing is of normal effort and unlabored. Cardiovascular: RRR with S1 S2. No murmurs, rubs, or gallops appreciated. Distal pulses 2+ symmetrically. No carotid or abdominal bruits. Abdomen: Soft, non-tender, non-distended with normoactive bowel sounds. No hepatosplenomegaly or masses. No rebound/guarding. No CVA tenderness. Without hernias.  Musculoskeletal: Full range of motion and 5/5 strength throughout. Without swelling, atrophy, tenderness, crepitus, or warmth. Extremities without clubbing, cyanosis, or edema. Calves supple. Skin: Warm and moist without  erythema, ecchymosis, wounds, or rash. Neuro: A+Ox3. CN II-XII grossly intact. Moves all extremities spontaneously. Full  sensation throughout. Normal gait. DTR 2+ throughout upper and lower extremities. Finger to nose intact. Psych:  Responds to questions appropriately with a normal affect.   Studies: CBC, CMET, Lipid, PSA, TSH, Vitamin D all pending. Patient is doing well Results for orders placed in visit on 09/10/11  COMPREHENSIVE METABOLIC PANEL      Component Value Range   Sodium 140  135 - 145 mEq/L   Potassium 4.5  3.5 - 5.3 mEq/L   Chloride 102  96 - 112 mEq/L   CO2 30  19 - 32 mEq/L   Glucose, Bld 92  70 - 99 mg/dL   BUN 18  6 - 23 mg/dL   Creat 1.61  0.96 - 0.45 mg/dL   Total Bilirubin 0.7  0.3 - 1.2 mg/dL   Alkaline Phosphatase 75  39 - 117 U/L   AST 17  0 - 37 U/L   ALT 14  0 - 53 U/L   Total Protein 7.3  6.0 - 8.3 g/dL   Albumin 4.2  3.5 - 5.2 g/dL   Calcium 9.7  8.4 - 40.9 mg/dL  LIPID PANEL      Component Value Range   Cholesterol 202 (*) 0 - 200 mg/dL   Triglycerides 811  <914 mg/dL   HDL 46  >78 mg/dL   Total CHOL/HDL Ratio 4.4     VLDL 28  0 - 40 mg/dL   LDL Cholesterol 295 (*) 0 - 99 mg/dL  POCT CBC      Component Value Range   WBC 9.0  4.6 - 10.2 K/uL   Lymph, poc 2.8  0.6 - 3.4   POC LYMPH PERCENT 31.3  10 - 50 %L   MID (cbc) 0.5  0 - 0.9   POC MID % 6.1  0 - 12 %M   POC Granulocyte 5.6  2 - 6.9   Granulocyte percent 62.6  37 - 80 %G   RBC 5.23  4.69 - 6.13 M/uL   Hemoglobin 15.8  14.1 - 18.1 g/dL   HCT, POC 62.1  30.8 - 53.7 %   MCV 97.2 (*) 80 - 97 fL   MCH, POC 30.2  27 - 31.2 pg   MCHC 31.1 (*) 31.8 - 35.4 g/dL   RDW, POC 65.7     Platelet Count, POC 242  142 - 424 K/uL   MPV 8.9  0 - 99.8 fL  POCT UA - MICROSCOPIC ONLY      Component Value Range   WBC, Ur, HPF, POC 0-2     RBC, urine, microscopic 0-1     Bacteria, U Microscopic negative     Mucus, UA positive     Epithelial cells, urine per micros negative     Crystals, Ur, HPF, POC  negative     Casts, Ur, LPF, POC negative     Yeast, UA negative    POCT URINALYSIS DIPSTICK      Component Value Range   Color, UA yellow     Clarity, UA clear     Glucose, UA neg     Bilirubin, UA neg     Ketones, UA neg     Spec Grav, UA 1.020     Blood, UA neg     pH, UA 6.0     Protein, UA neg     Urobilinogen, UA 0.2     Nitrite, UA neg     Leukocytes, UA Negative       Assessment/Plan:  71  y.o. y/o Hispanic male here for CPE - 1. Hypertension  Ambulatory referral to Ophthalmology, telmisartan-hydrochlorothiazide (MICARDIS HCT) 80-12.5 MG per tablet, Comprehensive metabolic panel, Lipid panel, POCT CBC, POCT UA - Microscopic Only, POCT urinalysis dipstick  2. Routine general medical examination at a health care facility  Ambulatory referral to Ophthalmology, telmisartan-hydrochlorothiazide (MICARDIS HCT) 80-12.5 MG per tablet, Comprehensive metabolic panel, Lipid panel, POCT CBC, POCT UA - Microscopic Only, POCT urinalysis dipstick, Ambulatory referral to Gastroenterology  3. Health care maintenance  Ambulatory referral to Gastroenterology     Signed, Elvina Sidle, MD 09/11/2011 1:47 PM

## 2012-04-16 ENCOUNTER — Encounter (HOSPITAL_COMMUNITY): Payer: Self-pay | Admitting: *Deleted

## 2012-04-16 ENCOUNTER — Emergency Department (HOSPITAL_COMMUNITY): Payer: Medicare Other

## 2012-04-16 ENCOUNTER — Inpatient Hospital Stay (HOSPITAL_COMMUNITY)
Admission: EM | Admit: 2012-04-16 | Discharge: 2012-04-18 | DRG: 305 | Disposition: A | Discharge status: Home / Self Care | Payer: Medicare Other | Attending: Family Medicine | Admitting: Family Medicine

## 2012-04-16 DIAGNOSIS — I119 Hypertensive heart disease without heart failure: Secondary | ICD-10-CM | POA: Diagnosis present

## 2012-04-16 DIAGNOSIS — Z7982 Long term (current) use of aspirin: Secondary | ICD-10-CM

## 2012-04-16 DIAGNOSIS — Z91199 Patient's noncompliance with other medical treatment and regimen due to unspecified reason: Secondary | ICD-10-CM

## 2012-04-16 DIAGNOSIS — Z9119 Patient's noncompliance with other medical treatment and regimen: Secondary | ICD-10-CM

## 2012-04-16 DIAGNOSIS — D45 Polycythemia vera: Secondary | ICD-10-CM

## 2012-04-16 DIAGNOSIS — R55 Syncope and collapse: Secondary | ICD-10-CM

## 2012-04-16 DIAGNOSIS — Z9861 Coronary angioplasty status: Secondary | ICD-10-CM

## 2012-04-16 DIAGNOSIS — I160 Hypertensive urgency: Secondary | ICD-10-CM | POA: Diagnosis present

## 2012-04-16 DIAGNOSIS — I2510 Atherosclerotic heart disease of native coronary artery without angina pectoris: Secondary | ICD-10-CM | POA: Diagnosis present

## 2012-04-16 DIAGNOSIS — I25118 Atherosclerotic heart disease of native coronary artery with other forms of angina pectoris: Secondary | ICD-10-CM | POA: Diagnosis present

## 2012-04-16 DIAGNOSIS — E785 Hyperlipidemia, unspecified: Secondary | ICD-10-CM | POA: Diagnosis present

## 2012-04-16 DIAGNOSIS — R079 Chest pain, unspecified: Secondary | ICD-10-CM

## 2012-04-16 DIAGNOSIS — Z8249 Family history of ischemic heart disease and other diseases of the circulatory system: Secondary | ICD-10-CM

## 2012-04-16 DIAGNOSIS — I10 Essential (primary) hypertension: Principal | ICD-10-CM | POA: Diagnosis present

## 2012-04-16 DIAGNOSIS — Z79899 Other long term (current) drug therapy: Secondary | ICD-10-CM

## 2012-04-16 HISTORY — DX: Hypertensive urgency: I16.0

## 2012-04-16 HISTORY — DX: Chest pain, unspecified: R07.9

## 2012-04-16 HISTORY — DX: Essential (primary) hypertension: I10

## 2012-04-16 LAB — CBC
HCT: 47.2 % (ref 39.0–52.0)
Hemoglobin: 17.3 g/dL — ABNORMAL HIGH (ref 13.0–17.0)
MCH: 32.5 pg (ref 26.0–34.0)
MCHC: 36.7 g/dL — ABNORMAL HIGH (ref 30.0–36.0)
MCV: 88.6 fL (ref 78.0–100.0)
Platelets: 191 10*3/uL (ref 150–400)
RBC: 5.33 MIL/uL (ref 4.22–5.81)
RDW: 13.2 % (ref 11.5–15.5)
WBC: 8.2 10*3/uL (ref 4.0–10.5)

## 2012-04-16 LAB — POCT I-STAT, CHEM 8
BUN: 14 mg/dL (ref 6–23)
Calcium, Ion: 1.21 mmol/L (ref 1.13–1.30)
Chloride: 103 mEq/L (ref 96–112)
Creatinine, Ser: 0.9 mg/dL (ref 0.50–1.35)
Glucose, Bld: 105 mg/dL — ABNORMAL HIGH (ref 70–99)
HCT: 51 % (ref 39.0–52.0)
Hemoglobin: 17.3 g/dL — ABNORMAL HIGH (ref 13.0–17.0)
Potassium: 4.5 mEq/L (ref 3.5–5.1)
Sodium: 143 mEq/L (ref 135–145)
TCO2: 29 mmol/L (ref 0–100)

## 2012-04-16 LAB — POCT I-STAT TROPONIN I: Troponin i, poc: 0 ng/mL (ref 0.00–0.08)

## 2012-04-16 MED ORDER — NITROGLYCERIN 2 % TD OINT
1.0000 [in_us] | TOPICAL_OINTMENT | Freq: Once | TRANSDERMAL | Status: AC
  Administered 2012-04-16: 1 [in_us] via TOPICAL
  Filled 2012-04-16: qty 1

## 2012-04-16 MED ORDER — ASPIRIN 325 MG PO TABS
325.0000 mg | ORAL_TABLET | Freq: Once | ORAL | Status: AC
  Administered 2012-04-16: 325 mg via ORAL
  Filled 2012-04-16: qty 1

## 2012-04-16 MED ORDER — SODIUM CHLORIDE 0.9 % IV BOLUS (SEPSIS)
1000.0000 mL | Freq: Once | INTRAVENOUS | Status: AC
  Administered 2012-04-16: 1000 mL via INTRAVENOUS

## 2012-04-16 MED ORDER — NITROGLYCERIN 0.4 MG SL SUBL
0.4000 mg | SUBLINGUAL_TABLET | SUBLINGUAL | Status: DC | PRN
  Administered 2012-04-16: 0.4 mg via SUBLINGUAL
  Filled 2012-04-16: qty 25

## 2012-04-16 NOTE — ED Provider Notes (Signed)
History     CSN: [id number]  Arrival date & time 04/16/12  1653   First MD Initiated Contact with Patient 04/16/12 1721      Chief Complaint  Patient presents with  . Chest Pain  . Headache  . Hypertension    (Consider location/radiation/quality/duration/timing/severity/associated sxs/prior treatment) Patient is a 72 y.o. male presenting with chest pain. The history is provided by the patient and a relative. No language interpreter was used.  Chest Pain Pain location:  L chest Pain quality comment:  Pinching Pain radiates to:  Does not radiate Pain radiates to the back: no   Pain severity:  Moderate Onset quality:  Sudden Timing:  Intermittent Progression:  Waxing and waning Chronicity:  New Context: at rest and stress   Context: not breathing, no drug use, not eating, not lifting and no movement   Relieved by:  Nothing Worsened by:  Nothing tried Ineffective treatments:  None tried Associated symptoms: headache (today since 4pm)   Associated symptoms: no abdominal pain, no back pain, no diaphoresis, no fatigue, no fever, no nausea, no numbness, no shortness of breath, not vomiting and no weakness   Associated symptoms comment:  Elevated blood pressure for about 1 week in 180-200 systolic Risk factors: coronary artery disease, hypertension and male sex     Past Medical History  Diagnosis Date  . Coronary artery disease   . Hypertension     Past Surgical History  Procedure Laterality Date  . Coronary stent placement      Family History  Problem Relation Age of Onset  . Heart disease Mother   . Cancer Sister     History  Substance Use Topics  . Smoking status: Never Smoker   . Smokeless tobacco: Not on file  . Alcohol Use: No      Review of Systems  Constitutional: Negative for fever, chills, diaphoresis, activity change, appetite change and fatigue.  HENT: Negative for congestion, sore throat, facial swelling, rhinorrhea, drooling, neck pain and voice  change.   Respiratory: Negative for shortness of breath and stridor.   Cardiovascular: Positive for chest pain.  Gastrointestinal: Negative for nausea, vomiting, abdominal pain, diarrhea and abdominal distention.  Endocrine: Negative for polydipsia and polyuria.  Genitourinary: Negative for dysuria, urgency, frequency and decreased urine volume.  Musculoskeletal: Negative for back pain and gait problem.  Skin: Negative for color change and wound.  Neurological: Positive for headaches (today since 4pm). Negative for facial asymmetry, weakness and numbness.  Hematological: Does not bruise/bleed easily.  Psychiatric/Behavioral: Negative for confusion and agitation.    Allergies  Review of patient's allergies indicates no known allergies.  Home Medications   Current Outpatient Rx  Name  Route  Sig  Dispense  Refill  . aspirin 81 MG chewable tablet   Oral   Chew 324 mg by mouth daily.         Marland Kitchen telmisartan-hydrochlorothiazide (MICARDIS HCT) 80-12.5 MG per tablet   Oral   Take 1 tablet by mouth daily.   30 tablet   11     BP 129/73  Pulse 65  Temp(Src) 97.8 F (36.6 C) (Oral)  Resp 26  Ht 5\' 6"  (1.676 m)  Wt 175 lb (79.379 kg)  BMI 28.26 kg/m2  SpO2 99%  Physical Exam  Constitutional: He is oriented to person, place, and time. He appears well-developed and well-nourished. No distress.  HENT:  Head: Normocephalic and atraumatic.  Mouth/Throat: No oropharyngeal exudate.  Eyes: Pupils are equal, round, and reactive to light.  Neck: Normal range of motion. Neck supple.  Cardiovascular: Normal rate, regular rhythm and normal heart sounds.  Exam reveals no gallop and no friction rub.   No murmur heard. Pulmonary/Chest: Effort normal and breath sounds normal. No respiratory distress. He has no wheezes. He has no rales.  Abdominal: Soft. Bowel sounds are normal. He exhibits no distension and no mass. There is no tenderness. There is no rebound and no guarding.   Musculoskeletal: Normal range of motion. He exhibits no edema and no tenderness.  Neurological: He is alert and oriented to person, place, and time.  Skin: Skin is warm and dry.  Psychiatric: He has a normal mood and affect.    ED Course  Procedures (including critical care time)  Labs Reviewed  CBC - Abnormal; Notable for the following:    Hemoglobin 17.3 (*)    MCHC 36.7 (*)    All other components within normal limits  GLUCOSE, CAPILLARY - Abnormal; Notable for the following:    Glucose-Capillary 138 (*)    All other components within normal limits  POCT I-STAT, CHEM 8 - Abnormal; Notable for the following:    Glucose, Bld 105 (*)    Hemoglobin 17.3 (*)    All other components within normal limits  POCT I-STAT TROPONIN I  POCT I-STAT TROPONIN I   Dg Chest 2 View  04/16/2012  *RADIOLOGY REPORT*  Clinical Data: Chest pain.  Hypertension.  CHEST - 2 VIEW  Comparison: CTA chest 11/05/2009.  Findings: The lung volumes are low.  No focal airspace disease is evident.  The visualized soft tissues and bony thorax are unremarkable.  IMPRESSION:  1.  No acute cardiopulmonary disease. 2.  Low lung volumes.   Original Report Authenticated By: Marin Roberts, M.D.      1. Hypertensive urgency   2. Chest pain      Date: 04/16/2012  Rate: 80  Rhythm: normal sinus rhythm  QRS Axis: normal  Intervals: normal  ST/T Wave abnormalities: normal  Conduction Disutrbances:none  Narrative Interpretation:   Old EKG Reviewed: unchanged    MDM  Pt is a 72 y.o. male with pertinent PMHX of HTN, CAD wit two cardiac stents who presents with 1 week of elevated BP from 180-200 systolic, as well as intermittent L sided CP since 2pm today, L sided h/a since 4pm today.  He had episode of SOB earlier which has resolved.  Denies numbness, weakness, confusion, blurry vision, n/v, diaphoresis, recent illness.  Pt reports medication compliance, but w/ pt out of room son reports he thinks he's only  taking 1/2 a tab of his micardis.  PE including cardiopulm and neuro exam are benign.  EKG NSR w/o ST changes, CXR unremarkble, troponin negative. ASA given, SL NTG ordered but CP resolved & BP improved before it was given.  Nitro paste placed.  Given concern for hypertensive urgency vs ACS, cardiology consulted.  As pt's HTN & CP improved, they felt he was safe for medical admission.  [facility] will eval pt in ED prior to admission.  After discussion between [facility] and family medicine, FM will admit as pt has seen provider at [facility].    12:04 AM Pt speaking w/ family medicine resident, suddenly said he felt dizzy, BP read in 50's systolic, pt became pale, diaphoretic, and had brief LOC.  BP quickly improved.  FSBG nml, EKG w/o acute changes (although has baseline tremor).  Pt likely had vasovagal syncope episode.  1L NS started. No focal  neuro findings on repeat neuro exam.  Family medicine will admit to step down unit.    1. Hypertensive urgency   2. Chest pain      Labs and imaging considered in decision making, reviewed by myself.  Imaging interpreted by radiology. Pt care discussed with my attending, Dr. Juleen China.    Toy Cookey, MD 04/17/12 [phone]

## 2012-04-16 NOTE — ED Notes (Signed)
Pt to ED c/o high bp x 1 week that increased today. Today at 1500 also began experiencing L chest pressure and headache.  Presently bp of 206/116.  Hx of 2 cardiac stent placements.

## 2012-04-17 ENCOUNTER — Encounter (HOSPITAL_COMMUNITY): Payer: Self-pay | Admitting: Sports Medicine

## 2012-04-17 DIAGNOSIS — D45 Polycythemia vera: Secondary | ICD-10-CM

## 2012-04-17 DIAGNOSIS — I517 Cardiomegaly: Secondary | ICD-10-CM

## 2012-04-17 DIAGNOSIS — R079 Chest pain, unspecified: Secondary | ICD-10-CM

## 2012-04-17 DIAGNOSIS — R55 Syncope and collapse: Secondary | ICD-10-CM

## 2012-04-17 DIAGNOSIS — I10 Essential (primary) hypertension: Secondary | ICD-10-CM

## 2012-04-17 DIAGNOSIS — E785 Hyperlipidemia, unspecified: Secondary | ICD-10-CM

## 2012-04-17 DIAGNOSIS — I2510 Atherosclerotic heart disease of native coronary artery without angina pectoris: Secondary | ICD-10-CM

## 2012-04-17 HISTORY — DX: Polycythemia vera: D45

## 2012-04-17 HISTORY — DX: Syncope and collapse: R55

## 2012-04-17 LAB — TSH: TSH: 4.888 u[IU]/mL — ABNORMAL HIGH (ref 0.350–4.500)

## 2012-04-17 LAB — BASIC METABOLIC PANEL
BUN: 16 mg/dL (ref 6–23)
CO2: 28 mEq/L (ref 19–32)
GFR calc non Af Amer: 82 mL/min — ABNORMAL LOW (ref >90)
Glucose, Bld: 129 mg/dL — ABNORMAL HIGH (ref 70–99)
Potassium: 3.6 mEq/L (ref 3.5–5.1)
Sodium: 138 mEq/L (ref 135–145)

## 2012-04-17 LAB — HEMOGLOBIN A1C: Hgb A1c MFr Bld: 5.7 % — ABNORMAL HIGH (ref <5.7)

## 2012-04-17 LAB — LIPID PANEL
HDL: 42 mg/dL (ref >39)
LDL Cholesterol: 100 mg/dL — ABNORMAL HIGH (ref 0–99)
Total CHOL/HDL Ratio: 3.9 RATIO
Triglycerides: 108 mg/dL (ref <150)
VLDL: 22 mg/dL (ref 0–40)

## 2012-04-17 LAB — CBC
HCT: 41.5 % (ref 39.0–52.0)
Hemoglobin: 15.1 g/dL (ref 13.0–17.0)
MCH: 32.3 pg (ref 26.0–34.0)
MCHC: 36.4 g/dL — ABNORMAL HIGH (ref 30.0–36.0)
MCV: 88.7 fL (ref 78.0–100.0)
RBC: 4.68 MIL/uL (ref 4.22–5.81)

## 2012-04-17 LAB — TROPONIN I: Troponin I: <0.3 ng/mL (ref <0.30)

## 2012-04-17 MED ORDER — IRBESARTAN 300 MG PO TABS
300.0000 mg | ORAL_TABLET | Freq: Every day | ORAL | Status: DC
  Administered 2012-04-17 – 2012-04-18 (×2): 300 mg via ORAL
  Filled 2012-04-17 (×2): qty 1

## 2012-04-17 MED ORDER — ASPIRIN 81 MG PO CHEW
324.0000 mg | CHEWABLE_TABLET | Freq: Every day | ORAL | Status: DC
  Administered 2012-04-17 – 2012-04-18 (×2): 324 mg via ORAL
  Filled 2012-04-17 (×2): qty 4

## 2012-04-17 MED ORDER — HYDROCHLOROTHIAZIDE 12.5 MG PO CAPS
12.5000 mg | ORAL_CAPSULE | Freq: Every day | ORAL | Status: DC
  Administered 2012-04-17: 12.5 mg via ORAL
  Filled 2012-04-17: qty 1

## 2012-04-17 MED ORDER — TELMISARTAN-HCTZ 80-12.5 MG PO TABS: 1.0000 | ORAL_TABLET | Freq: Every day | ORAL | Status: DC

## 2012-04-17 MED ORDER — SODIUM CHLORIDE 0.9 % IJ SOLN
3.0000 mL | Freq: Two times a day (BID) | INTRAMUSCULAR | Status: DC
  Administered 2012-04-17 – 2012-04-18 (×4): 3 mL via INTRAVENOUS

## 2012-04-17 MED ORDER — METOPROLOL TARTRATE 1 MG/ML IV SOLN
5.0000 mg | INTRAVENOUS | Status: DC | PRN
  Administered 2012-04-17: 5 mg via INTRAVENOUS

## 2012-04-17 MED ORDER — METOPROLOL TARTRATE 1 MG/ML IV SOLN
INTRAVENOUS | Status: AC
  Filled 2012-04-17: qty 5

## 2012-04-17 MED ORDER — CARVEDILOL 12.5 MG PO TABS
12.5000 mg | ORAL_TABLET | Freq: Two times a day (BID) | ORAL | Status: DC
  Filled 2012-04-17 (×3): qty 1

## 2012-04-17 MED ORDER — HYDROCHLOROTHIAZIDE 12.5 MG PO CAPS
25.0000 mg | ORAL_CAPSULE | Freq: Every day | ORAL | Status: DC
  Administered 2012-04-18: 25 mg via ORAL
  Filled 2012-04-17: qty 2

## 2012-04-17 MED ORDER — HEPARIN SODIUM (PORCINE) 5000 UNIT/ML IJ SOLN
5000.0000 [IU] | Freq: Three times a day (TID) | INTRAMUSCULAR | Status: DC
  Administered 2012-04-17 – 2012-04-18 (×5): 5000 [IU] via SUBCUTANEOUS
  Filled 2012-04-17 (×7): qty 1

## 2012-04-17 MED ORDER — ATORVASTATIN CALCIUM 20 MG PO TABS
20.0000 mg | ORAL_TABLET | Freq: Every day | ORAL | Status: DC
  Administered 2012-04-17 (×2): 20 mg via ORAL
  Filled 2012-04-17 (×3): qty 1

## 2012-04-17 NOTE — Progress Notes (Signed)
Echocardiogram 2D Echocardiogram has been performed.  [name] 04/17/2012, 4:20 PM

## 2012-04-17 NOTE — Progress Notes (Signed)
Family Medicine Teaching Service Attending Note  I discussed patient [name]  with Dr. Pollie Meyer and reviewed their note for today.  I agree with their assessment and plan.

## 2012-04-17 NOTE — Care Management Note (Addendum)
    Page 1 of 1   04/18/2012     2:55:53 PM   CARE MANAGEMENT NOTE 04/18/2012  Patient:  [name], [name]   Account Number:  [id number]  Date Initiated:  04/17/2012  Documentation initiated by:  Junius Creamer  Subjective/Objective Assessment:   adm w htn     Action/Plan:   lives w wife   Anticipated DC Date:  04/19/2012   Anticipated DC Plan:  HOME/SELF CARE      DC Planning Services  CM consult      Choice offered to / List presented to:             Status of service:  In process, will continue to follow Medicare Important Message given?   (If response is "NO", the following Medicare IM given date fields will be blank) Date Medicare IM given:   Date Additional Medicare IM given:    Discharge Disposition:  HOME/SELF CARE  Per UR Regulation:  Reviewed for med. necessity/level of care/duration of stay  If discussed at Long Length of Stay Meetings, dates discussed:    Comments:  04/18/12 Daelynn Blower,RN,BSN [phone] MET WITH PT TO DISCUSS ISSUES WITH MEDICATION AFFORDABILITY, AS PT STATES HE HAD STOPPED TAKING BP MEDS DUE TO COST ISSUES.  PT HAS AARP MEDICARE, AND HE STATES HIS COPAY FOR BP MED IS HIGH.  APPEARS THAT BP MEDS HAVE BEEN CHANGED TO AVAPRO AND HCTZ.  HCTZ IS ON $4 GENERIC LIST, BUT AVAPRO IS NOT.  PT ALSO NOW ON LIPITOR, WHICH IS NOT GENERIC, AND MAY HAVE A HIGH COPAY AS WELL.  MAY CONSIDER LOVASTATIN, AS IT WOULD BE A LESS EXPENSIVE OPTION FOR PT.  COST OF MEDS SEEMS TO BE VERY IMPORTANT TO PT...WOULD ATTEMPT TO KEEP COSTS DOWN TO ENCOURAGE COMPLIANCE.   2/27 1001 debbie dowell rn,bsn

## 2012-04-17 NOTE — H&P (Signed)
Family Medicine Teaching Service Admission H&P Service Pager: [phone]  Patient name: [name] Medical record number: [id number] Date of birth: June 10, 1940 Age: 72 y.o. Gender: male  Primary Care Provider: No primary provider on file. - Followed @ [facility] Attending Physician: Nestor Ramp, MD Consultants:   CODE STATUS: FULL CODE  CC  Left Chest pain and Headache with noted elevated BP x2 weeks  HPI  [name] is a 72 y.o. year old male presenting with 2 week history of markedly elevated home BP reading in the 180s - 200s systolic.  Reports that he had been feeling okay with this until today when he developed some chest discomfort and headache.  He describes the discomfort as non-radiating, pinching.  He reported to the EDP he had tried nothing and that it was not associated with activity or rest, no diaphoresis, fatigue.  His BP was noted to be 203/116 at the time of initial evaluation in the ED and he was reporting this Left sided headache.  Due to his significant cardiac history, and his elevated BP FMTS was called for admission.   Prior to my evaluation the pt reported his symptoms had resolved and his BP spontaneously came down to the 130s/70s range.  Also it was reported from his son that [name] had not been completely compliant with his medication regimen and had been taking 1/2 doses due to costs.    Upon my initial evaluation he was telling me about his elevated BP, chest pain and HR when, suddenly reported  being dizzy following his BP cuff inflating then became unresponsive with a BP noted to be in the 50s/30s and tachycardic to the 130s on the monitor appearing to be sinus rhythm.  1L NS bolus was ordered with repeat EKGs that were unchanged.  He recovered quickly however was very groggy, reporting he needed to have a BM and urinate.  His neurologic exam recovered and he had no deficit on exam.  After a 30 minute period I was able to  obtain a full history from him and he was agreeable to admission   ROS   Constitutional Feeling okay until today.  Hx of non-compliance, likely due to poor medical literacy.  Infectious No fevers, no chills  Resp No cough, no congestion  Cardiac No palpitations, no orthostasis or syncope with exception of episode in ED  GI No n/v.  No diarrhea, no melana, no hematochezia  PSYCH: reports being scarred about his current medical state especially following his syncopal episode    Son reported to EDP that he has been taking 1/2 dose of meds.  Has Medication Insurance.  Reports micardis was tolerated well but potential allergy to ACEi? But he was having a hard time clarifying.  HISTORY  PMHx:  Past Medical History  Diagnosis Date  . Coronary artery disease   . Hypertension     PSHx: Past Surgical History  Procedure Laterality Date  . Coronary stent placement      Social Hx: History   Social History  . Marital Status: Married    Spouse Name: N/A    Number of Children: N/A  . Years of Education: N/A   Social History Main Topics  . Smoking status: Never Smoker   . Smokeless tobacco: None  . Alcohol Use: No  . Drug Use: No  . Sexually Active: None   Other Topics Concern  . None   Social History Narrative   Lives in [place] with  Wife and 2 sons.  From [place].  To [place] ~2000.     Currently retired but worked in Theatre manager    Family Hx: Family History  Problem Relation Age of Onset  . Heart disease Mother   . Cancer Sister     Allergies: No Known Allergies  Home Medications: Prescriptions prior to admission  Medication Sig Dispense Refill  . aspirin 81 MG chewable tablet Chew 324 mg by mouth daily.      Marland Kitchen telmisartan-hydrochlorothiazide (MICARDIS HCT) 80-12.5 MG per tablet Take 1 tablet by mouth daily.  30 tablet  11    OBJECTIVE  Vitals: Temp:  [97.8 F (36.6 C)-98.1 F (36.7 C)] 98.1 F (36.7 C) (02/27 0100) Pulse Rate:  [65-114] 79  (02/27 0100) Resp:  [16-26] 23 (02/27 0100) BP: (84-203)/(38-116) 136/76 mmHg (02/27 0100) SpO2:  [94 %-100 %] 95 % (02/27 0100) Weight:  [175 lb (79.379 kg)-177 lb 14.6 oz (80.7 kg)] 177 lb 14.6 oz (80.7 kg) (02/27 0050)  Weight: Wt Readings from Last 3 Encounters:  04/17/12 177 lb 14.6 oz (80.7 kg)  09/10/11 176 lb (79.833 kg)  03/24/11 179 lb (81.194 kg)    I&Os: Yesterday:   This shift:     PE: GENERAL:  Elderly Hispanic  male. In no discomfort; no respiratory distress. PSYCH: Alert and appropriately interactive; Insight:Good  However medical literacy appears low to average H&N: AT/[place], trachea midline EENT:  MMM, no scleral icterus, EOMi HEART: RRR, S1/S2 heard, no murmur LUNGS: CTA B, no wheezes, no crackles EXTREMITIES: Moves all 4 extremities spontaneously, warm well perfused, no edema, bilateral DP and PT pulses 2/4.   NEURO: Gross Deficits: None, no facial asymetry  Speech: Normal, no dysarthria  Gait: deferred  Cerebellar: deferred  CN: II-XI intact  Motor: Strength 5+/5 in UE & LE once recovered from syncopal episode    LABS: CBC BMET   Recent Labs Lab 04/16/12 1703 04/16/12 1728  WBC 8.2  --   HGB 17.3* 17.3*  HCT 47.2 51.0  PLT 191  --     Recent Labs Lab 04/16/12 1728  NA 143  K 4.5  CL 103  BUN 14  CREATININE 0.90  GLUCOSE 105*     URINE STUDIES: none  MICRO: none  IMAGING: Dg Chest 2 View  04/16/2012  *RADIOLOGY REPORT*  Clinical Data: Chest pain.  Hypertension.  CHEST - 2 VIEW  Comparison: CTA chest 11/05/2009.  Findings: The lung volumes are low.  No focal airspace disease is evident.  The visualized soft tissues and bony thorax are unremarkable.  IMPRESSION:  1.  No acute cardiopulmonary disease. 2.  Low lung volumes.   Original Report Authenticated By: Marin Roberts, M.D.    2/27 Multiple EKGs in ED: NSR, no ischemia  Medications:    . aspirin  324 mg Oral Daily  . atorvastatin  20 mg Oral q1800  . heparin  5,000  Units Subcutaneous Q8H  . sodium chloride  3 mL Intravenous Q12H   nitroGLYCERIN  Assessment & Plan  LOS: 64 72 y.o. year old male with significant Cardiac History presenting with chest pain and headache in the setting of a hypertensive crisis who had an apparent syncopal episode while being evaluated in the Emergency Department.  # CV (HTN Emergency, Chest Pain, CAD s/p stenting in 2008, Hx of HTN): Followed by Dr. Excell Seltzer [facility]).  Given significant history and poor medication compliance with antihypertensives and anti-lipid meds will admit for ACS evaluation and intensive BP control.  Pt's BP was significantly improved spontaneously while in the ED however received nitro paste; potentially contributed to syncopal episode.  Will cycle CE, start statin, monitor BP, place on tele, repeat EKG in AM, TSH & Lipid panel.  Will ask cardiology to come by to re-evaluate given established relationship and given syncopal episode.  In reviewing his prior stenting it appears that he was found to have significant disease in spite of ruling out.  No prior CHF dx, EF preserved previously  Nitro  ASA  > CARDS consult    # Syncope: Likely vasovagal given temporally associated with taking of his BP however not completely clear.  No acute EKG changes, no acute neurologic deficits on exam.  No new cardiac symptoms.  See above for cardiac workup.  Will not replace NitroPaste.  Telemetry was sinus tachycardia (2/26@2345 ) during episode then NSR during recovery.  Workup as above, further recommendations per cardiology  # HLD: Known elevated cholesterol.  Not on statin.  Will start Lipitor  # Polycythemia: no prior hx of polycythemia. No known reasons.  Provide IV fluids and continue to monitor   # PSYCH: Pt was very nervous following syncopal event and reported being scared. Pt does reporting doing what ever it takes to help his health going forward  --- FEN  *SL following Bolus -Heart Healthy --- PPx:  Heparin    Disposition  Pt to SDU for close monitoring and BP control.  Currently spontaneously improved.  ACS evaluation and consult to Cardiology in AM given unclear etiology of syncopal event (sinus tachycardic during hypotensive episode).  Will need BP regimen adjusted but not adding additional agents until SBP >160 given profound syncopal response.  Consider BB in addition to ARB/HCTZ given CAD.  Also consider change to generic combination - ?generic Diovan. Disposition pending further clinical improvement   Andrena Mews, DO [facility] Resident - PGY-2  04/17/2012 1:32 AM

## 2012-04-17 NOTE — ED Notes (Signed)
Pt son [phone]

## 2012-04-17 NOTE — ED Notes (Addendum)
Pt shaking, appears anxious, placed on 2L of oxygen. Neuro intact. No facial droop equal grips no arm/leg drift. Pressure rechecked 136/89

## 2012-04-17 NOTE — Progress Notes (Signed)
Family Medicine Teaching Service Daily Progress Note Service Page: [phone]  Patient Assessment: [name] is a 72 y.o. year old male with significant Cardiac History presenting with chest pain and headache in the setting of a hypertensive crisis who had an apparent syncopal episode while being evaluated in the Emergency Department.   Subjective: Pt reports he is doing well this morning. No further chest pain, SOB, or dizziness. He did eat breakfast. No headache. Per RN no tele events overnight since admission to stepdown.  Objective: Temp:  [97.6 F (36.4 C)-98.1 F (36.7 C)] 97.6 F (36.4 C) (02/27 0730) Pulse Rate:  [59-114] 60 (02/27 0730) Resp:  [15-26] 15 (02/27 0730) BP: (84-203)/(38-116) 114/70 mmHg (02/27 0730) SpO2:  [94 %-100 %] 95 % (02/27 0730) Weight:  [175 lb (79.379 kg)-177 lb 14.6 oz (80.7 kg)] 177 lb 14.6 oz (80.7 kg) (02/27 0050) Exam: General: NAD Cardiovascular: RRR, no murmurs auscultated Respiratory: NWOB, CTAB Abdomen: nontender to palpation Extremities: calves nontender to palpation, no appreciable lower extremity edema Neuro: speech intact, tongue midline, grip 5/5, upper & lower ext 5/5 in strength, face sensation intact to light touch bilaterally, shoulder shrug 5/5, EOMI.  I have reviewed the patient's medications, labs, imaging, and diagnostic testing.  Notable results are summarized below.  Medications:  Scheduled Meds: . aspirin  324 mg Oral Daily  . atorvastatin  20 mg Oral q1800  . heparin  5,000 Units Subcutaneous Q8H  . hydrochlorothiazide  12.5 mg Oral Daily  . irbesartan  300 mg Oral Daily  . sodium chloride  3 mL Intravenous Q12H   Continuous Infusions:  PRN Meds:.nitroGLYCERIN  Labs:  CBC  Recent Labs Lab 04/16/12 1703 04/16/12 1728 04/17/12 0200  WBC 8.2  --  12.5*  HGB 17.3* 17.3* 15.1  HCT 47.2 51.0 41.5  PLT 191  --  176    BMET  Recent Labs Lab 04/16/12 1728 04/17/12 0200  NA 143 138  K 4.5 3.6  CL 103  102  CO2  --  28  BUN 14 16  CREATININE 0.90 0.98  GLUCOSE 105* 129*  CALCIUM  --  9.3   Lipids:    Component Value Date/Time   CHOL 164 04/17/2012 0200   TRIG 108 04/17/2012 0200   HDL 42 04/17/2012 0200   LDLDIRECT 150.8 06/30/2008 1007   VLDL 22 04/17/2012 0200   CHOLHDL 3.9 04/17/2012 0200  LDL 100  TSH pending POC troponin negative Real trop negative x2, one more pending around 1:30pm  Imaging/Diagnostic Tests:  CXR: 1. No acute cardiopulmonary disease.  2. Low lung volumes.  EKG per my read appears unchanged today  Assessment/Plan:  [name] is a 72 y.o. year old male with significant Cardiac History presenting with chest pain and headache in the setting of a hypertensive crisis who had an apparent syncopal episode while being evaluated in the Emergency Department.   # CV (HTN Emergency, Chest Pain, CAD s/p stenting in 2008, Hx of HTN): Followed by Dr. Excell Seltzer [facility]). Pt has history of significant CAD despite successfully "ruling out" in the past. Poorly compliant with antihypertensives & lipid lowering medicines. BP improved spontaneously in ED after getting nitro paste but then pt had syncopal episode. - troponin now negativie x 2, f/u final troponin -consult cardiology for input re: chest pain & syncopal episode -continue statin for primary prevention -monitor on telemetry & continue to monitor BP (vitals stable since midight) -Nitro  -ASA   # Syncope: Likely vasovagal given temporally associated with taking  of his BP however not completely clear. No acute EKG changes, no acute neurologic deficits on admission exam or presently. No new cardiac symptoms. Telemetry was sinus tachycardia (2/26@2345 ) during episode then NSR during recovery.  - Had planned not to replace nitropaste but it appears he still has it on. Will discuss at rounds whether to continue this.  - Per RN no tele events overnight.  - Consult cardiology as above.  # HLD: Known elevated  cholesterol. Not on statin.  - continue lipitor (started during this hospitalization)  # Polycythemia: noted on admission. no prior hx of polycythemia. No known reasons.  -Hgb has come down will administration of IVF -continue to monitor  # PSYCH:  -Pt nervous after syncopal event in ED -seems to be doing okay right now  # FEN  -SL following Bolus  -Heart Healthy diet  # PPx: Heparin  # Dispo:  - currently stepdown status - pending ACS evaluation & cardiology consult - also pending clinical improvement  Levert Feinstein, MD [facility] Medicine PGY-1 Service Pager [phone]

## 2012-04-17 NOTE — H&P (Signed)
FMTS Attending Admission Note: Sara Neal MD [phone] pager office [phone] I  have seen and examined this patient, reviewed their chart. I have discussed this patient with the resident. I agree with the resident's findings, assessment and care plan. 

## 2012-04-17 NOTE — ED Notes (Signed)
Per Gerald Chough DO pt std feeling lightheaded upon exam. Pt became diaphoretic, pale, minimally responsive HR 104 BP 53/34. Bobbty RN started NS 1 L bolus.

## 2012-04-17 NOTE — Progress Notes (Signed)
pts bp now 168/96, HR 72, pt denies any pain. Updated 2000 RN will transfer to tele.

## 2012-04-17 NOTE — Progress Notes (Signed)
pts bp eleveated >180, HR 90, pt sitting up in bed, ate dinner, no pain. Notified md. Orders recd. Will admin prn bp meds and transfer to 2000 when BP Less than 160.

## 2012-04-17 NOTE — Consult Note (Signed)
CARDIOLOGY CONSULT NOTE  Patient ID: [name] MRN: [id number], DOB/AGE: Apr 25, 1940   Admit date: 04/16/2012 Date of Consult: 04/17/2012   Primary Physician: No primary provider on file. Primary Cardiologist: Dr. Excell Seltzer  HPI: Patient is a 72 yo M with history of HTN & CAD s/p stenting (2008) who presented on 04/16/12 to the ED because he checked his BP at home and it was 210/100.  He also noted sudden onset, pinching, non-radiating, left sided chest pain. CP accompanied with HA, but he denied abdominal pain, back pain, diaphoresis, fatigue, fever, nausea, vomiting and SOB at admission.  He noted that his BP had been elevated 180-200 for the last week.  He has been taking half dose of his BP meds due to cost and thinking the higher dose was unnecessary for about 1-2 weeks, but had not taken any BP meds for several weeks prior to that.  In the ED, during interview with the FMTS resident, he became lightheaded, diaphoretic, pale and minimally responsive with HR to 130s (sinus per chart review) and hypotensive to 53/34. After 1L NS bolus, he recovered.  ED meds include nitro paste and SL NTG, but no other BP lowering meds.  No tongue biting, incontinence or tonic clonic mvmts during episode.  Problem List  Past Medical History  Diagnosis Date  . Coronary artery disease   . Hypertension     Past Surgical History  Procedure Laterality Date  . Coronary stent placement       Allergies Allergies  Allergen Reactions  . Pork-Derived Products     No pork products     Inpatient Medications  . aspirin  324 mg Oral Daily  . atorvastatin  20 mg Oral q1800  . heparin  5,000 Units Subcutaneous Q8H  . hydrochlorothiazide  12.5 mg Oral Daily  . irbesartan  300 mg Oral Daily  . sodium chloride  3 mL Intravenous Q12H    Family History Family History  Problem Relation Age of Onset  . Heart disease Mother   . Cancer Sister      Social History History   Social History  . Marital  Status: Married    Spouse Name: N/A    Number of Children: N/A  . Years of Education: N/A   Occupational History  . Not on file.   Social History Main Topics  . Smoking status: Never Smoker   . Smokeless tobacco: Not on file  . Alcohol Use: No  . Drug Use: No  . Sexually Active: Not on file   Other Topics Concern  . Not on file   Social History Narrative   Lives in [place] with Wife and 2 sons.  From [place].  To [place] ~2000.     Currently retired but worked in Theatre manager     Review of Systems General:  No chills, fever, night sweats or weight changes.  Cardiovascular:  No dyspnea on exertion, edema, orthopnea, palpitations, paroxysmal nocturnal dyspnea. Dermatological: No rash, lesions/masses Respiratory: No cough, dyspnea Urologic: No hematuria, dysuria Abdominal:   No nausea, vomiting, diarrhea, bright red blood per rectum, melena, or hematemesis Neurologic:  No visual changes, wkns, changes in mental status. All other systems reviewed and are otherwise negative except as noted above.  Physical Exam  Blood pressure 143/74, pulse 87, temperature 97.6 F (36.4 C), temperature source Oral, resp. rate 18, height 5\' 6"  (1.676 m), weight 177 lb 14.6 oz (80.7 kg), SpO2 97.00%.  General: Pleasant, NAD Psych: Normal affect. Neuro: Alert  and oriented X 3. Moves all extremities spontaneously. HEENT: Normal  Neck: Supple without bruits or JVD. Lungs:  Resp regular and unlabored, CTA. Heart: RRR no s3, s4, or murmurs. Abdomen: Soft, non-tender, non-distended, BS + x 4.  Extremities: No clubbing, cyanosis or edema. DP/PT/Radials 2+ and equal bilaterally.  Labs   Recent Labs  04/17/12 0201 04/17/12 0702  TROPONINI <0.30 <0.30   Lab Results  Component Value Date   WBC 12.5* 04/17/2012   HGB 15.1 04/17/2012   HCT 41.5 04/17/2012   MCV 88.7 04/17/2012   PLT 176 04/17/2012     Recent Labs Lab 04/17/12 0200  NA 138  K 3.6  CL 102  CO2 28  BUN 16    CREATININE 0.98  CALCIUM 9.3  GLUCOSE 129*   Lab Results  Component Value Date   CHOL 164 04/17/2012   HDL 42 04/17/2012   LDLCALC 100* 04/17/2012   TRIG 108 04/17/2012   Lab Results  Component Value Date   DDIMER  Value: 0.22        AT THE INHOUSE ESTABLISHED CUTOFF VALUE OF 0.48 ug/mL FEU, THIS ASSAY HAS BEEN DOCUMENTED IN THE LITERATURE TO HAVE A SENSITIVITY AND NEGATIVE PREDICTIVE VALUE OF AT LEAST 98 TO 99%.  THE TEST RESULT SHOULD BE CORRELATED WITH AN ASSESSMENT OF THE CLINICAL PROBABILITY OF DVT / VTE. 11/05/2009    Radiology/Studies  Dg Chest 2 View 04/16/2012   IMPRESSION:  1.  No acute cardiopulmonary disease. 2.  Low lung volumes.     ECG - sinus rhythm, HR 65  Past Cardiac Studies Cardiac Cath (04/03/2006 - [facility]) s/p PCI (04/04/2006 - [facility]) The LAD was a long vessel coursing to the apex. It gave off a small first diagonal and a medium sized second diagonal. The LAD was diffusely diseased throughout its course. There was a 50% ostial lesion, a diffuse long 80% proximal lesion, and distally was diffusely diseased at 60-70%.  The left circumflex was made up primarily of a large branching OM1.  There was a 99% lesion in the small AV groove circumflex right after the takeoff of the OM1. In the OM1, there was a tandem 70-80% lesion proximally and a 90% lesion more distally.  The right coronary artery was a dominant vessel. It had diffuse mild disease of 30% proximally. It gave off a moderate sized PDA and two posterolaterals. In the ostium of the PDA, there was a 90% focal lesion.  The left ventriculogram was done in the RAO position. EF was a bit hard to determine due to ectopy, it appeared to be a 70% with no obvious wall motion abnormalities.  Abdominal aortogram was done due to severe hypertension and to rule out renal artery stenosis and showed a normal abdominal aorta with patent renal arteries bilaterally. PCI--> Successful percutaneous revascularization of two lesions  in the circumflex. Due to his acute coronary syndrome, I would prefer that he remain on Plavix for a year. At minimum, this should be continued for 30 days. Aspirin should be continued indefinitely. We will work on blood pressure control with ACE inhibitor. Statin was initiated.  -Patient was scheduled for stress test in 2011, but cancelled due to severe back pain and elevated BP.   ASSESSMENT AND PLAN [name] is a 72 y.o. year old male with h/o CAD and HTN who presented with chest pain and headache on 04/16/12. Cardiology asked to consult on 04/17/12 Re: syncope.  #Syncope: In ED, patient was treated with both NTG SL (  19:57) and ointment (20:45), and BP dropped to SBP 50s per chart review.  Improved after 1L bolus.   -Likely due to hypotension in setting of NTG, as he denies vagal activity, and history is not suggestive of seizure disorder. He has no neuro deficits on physical exam, thus CVA less likely. -Check orthostatic vital signs -2D echo  #CAD: History of 3 vessel CAD with mod diffuse LAD stenosis, sm vessel disease in the RCA and severe stenosis in the LCx that was treated with BMS in 2008; At that time LVEF 70%;  Home regimen includes ASA 81 & telmisartan-HCTZ 80-12.5. Patient not on BB.  #HTN:  BP at admission was 203/116.  Home regimen is telmisartan-HCTZ 80-12.5, and BP is uncontrolled, presumably due to cost of medication ($100/month) -Recommend patient resuming a more affordable regimen such as HCTZ with carvedilol.  He was on ARB, but does not have history of diabetic renal disease or CHF, so may not require this therapy.  #HLD: Has refused to take statins in the past; LDL 100 at this admission; currently on lipitor for primary prevention, but family still skeptical given age and general well being.  Signed, Stacy Gardner, MD, PGY2 04/17/2012, 1:44 PM   Patient seen, examined. Available data reviewed. Agree with findings, assessment, and plan as outlined by Dr Everardo Beals.  The patient is known to me, but I have not seen him in the office in a few years. He has coronary artery disease and hypertension. He has been resistant to taking a statin drug for hyperlipidemia. The patient's clinical history was carefully reviewed. I examined him independently in his exam shows a pleasant, alert and oriented male in no acute distress. There were no carotid bruits. His lung fields are clear. His heart is regular rate and rhythm without a gallop or murmur, and there is no peripheral edema present. The patient's electrocardiogram shows no acute abnormalities. I suspect his chest pain and headache were related to hypertensive urgency. This occurred because he was not reliably taking his antihypertensive medication secondary to cost. He then had marked hypotension after placement of nitroglycerin paste. I suspect this was a reaction to the vasodilatory properties of nitroglycerin. He has responded well to a fluid bolus.  Recommend check orthostatic vital signs, update echocardiogram his LV function has not been assessed in several years, and consider antihypertensive change so that he can obtain generic medicines. A combination of carvedilol and hydrochlorothiazide would be reasonable, but will defer to the family medicine service as there are multiple appropriate options.  I will followup with the patient in the office and this appointment will be arranged. We will contact him for his followup visit.  Tonny Bollman, M.D. 04/17/2012 2:30 PM

## 2012-04-18 LAB — CBC
HCT: 40.8 % (ref 39.0–52.0)
MCHC: 35.5 g/dL (ref 30.0–36.0)
Platelets: 176 10*3/uL (ref 150–400)
RDW: 13.4 % (ref 11.5–15.5)
WBC: 7.8 10*3/uL (ref 4.0–10.5)

## 2012-04-18 LAB — BASIC METABOLIC PANEL
BUN: 17 mg/dL (ref 6–23)
Calcium: 9 mg/dL (ref 8.4–10.5)
Chloride: 104 mEq/L (ref 96–112)
Creatinine, Ser: 0.96 mg/dL (ref 0.50–1.35)
GFR calc Af Amer: >90 mL/min (ref >90)

## 2012-04-18 MED ORDER — CARVEDILOL 6.25 MG PO TABS
6.2500 mg | ORAL_TABLET | Freq: Two times a day (BID) | ORAL | Status: DC
  Administered 2012-04-18: 6.25 mg via ORAL
  Filled 2012-04-18 (×3): qty 1

## 2012-04-18 MED ORDER — HYDROCHLOROTHIAZIDE 25 MG PO TABS: 25.0000 mg | ORAL_TABLET | Freq: Every day | ORAL | Status: DC

## 2012-04-18 MED ORDER — LOSARTAN POTASSIUM 100 MG PO TABS: 100.0000 mg | ORAL_TABLET | Freq: Every day | ORAL | Status: DC

## 2012-04-18 MED ORDER — CARVEDILOL 6.25 MG PO TABS: 6.2500 mg | ORAL_TABLET | Freq: Two times a day (BID) | ORAL | Status: DC

## 2012-04-18 MED ORDER — ATORVASTATIN CALCIUM 20 MG PO TABS: 20.0000 mg | ORAL_TABLET | Freq: Every day | ORAL | Status: DC

## 2012-04-18 NOTE — Progress Notes (Signed)
Family Medicine Teaching Service Attending Note  I interviewed and examined patient [name] and reviewed their tests and x-rays.  I discussed with Dr. Pollie Foster and reviewed their note for today.  I agree with their assessment and plan.     Additionally  Feels completely well Able to walk around halls at normal pace without shortness of breath or chest pain or lightheadness HR stays in upper 80s Appreciate cardiology comments Stable for discharge  Follow up closely on his blood pressure and pheo labs

## 2012-04-18 NOTE — Progress Notes (Signed)
Family Medicine Teaching Service Daily Progress Note Service Page: [phone]  Patient Assessment: [name] is a 72 y.o. year old male with significant Cardiac History presenting with chest pain and headache in the setting of a hypertensive crisis who had an apparent syncopal episode while being evaluated in the Emergency Department.   Subjective: Pt reports he is doing well this morning.Denies chest pain, SOB, headache, dizziness.  Eating & drinking well. Willing to go home today.  Objective: Temp:  [97 F (36.1 C)-97.6 F (36.4 C)] 97.5 F (36.4 C) (02/28 0502) Pulse Rate:  [60-87] 62 (02/28 0502) Resp:  [15-18] 17 (02/28 0502) BP: (114-182)/(70-97) 138/79 mmHg (02/28 0502) SpO2:  [95 %-97 %] 96 % (02/28 0502) Exam: General: NAD Cardiovascular: RRR, no murmurs auscultated Respiratory: NWOB, CTAB Abdomen: nontender to palpation Extremities: calves nontender to palpation, no appreciable lower extremity edema Neuro: speech intact  I have reviewed the patient's medications, labs, imaging, and diagnostic testing.  Notable results are summarized below.  Medications:  Scheduled Meds: . aspirin  324 mg Oral Daily  . atorvastatin  20 mg Oral q1800  . carvedilol  12.5 mg Oral BID WC  . heparin  5,000 Units Subcutaneous Q8H  . hydrochlorothiazide  25 mg Oral Daily  . irbesartan  300 mg Oral Daily  . sodium chloride  3 mL Intravenous Q12H   Continuous Infusions:  PRN Meds:.metoprolol, nitroGLYCERIN  Labs:  CBC  Recent Labs Lab 04/16/12 1703 04/16/12 1728 04/17/12 0200 04/18/12 0600  WBC 8.2  --  12.5* 7.8  HGB 17.3* 17.3* 15.1 14.5  HCT 47.2 51.0 41.5 40.8  PLT 191  --  176 176    BMET  Recent Labs Lab 04/16/12 1728 04/17/12 0200  NA 143 138  K 4.5 3.6  CL 103 102  CO2  --  28  BUN 14 16  CREATININE 0.90 0.98  GLUCOSE 105* 129*  CALCIUM  --  9.3   Lipids:    Component Value Date/Time   CHOL 164 04/17/2012 0200   TRIG 108 04/17/2012 0200   HDL 42  04/17/2012 0200   LDLDIRECT 150.8 06/30/2008 1007   VLDL 22 04/17/2012 0200   CHOLHDL 3.9 04/17/2012 0200  LDL 100  POC troponin negative Real trop negative x3 A1c 5.7 TSH 4.888  Imaging/Diagnostic Tests:  CXR: 1. No acute cardiopulmonary disease.  2. Low lung volumes.  EKG per my read again appears unchanged today  Echo: - Left ventricle: The cavity size was normal. Wall thickness was increased in a pattern of moderate LVH. Systolic function was vigorous. The estimated ejection fraction was in the range of 65% to 70%. Wall motion was normal; there were no regional wall motion abnormalities. Doppler parameters are consistent with abnormal left ventricular relaxation (grade 1 diastolic dysfunction). - Aortic valve: Trivial regurgitation. - Mitral valve: Calcified annulus. - Left atrium: The atrium was mildly dilated.    Assessment/Plan:  [name] is a 72 y.o. year old male with significant Cardiac History presenting with chest pain and headache in the setting of a hypertensive crisis who had an apparent syncopal episode while being evaluated in the Emergency Department.   # CV (HTN Emergency, Chest Pain, CAD s/p stenting in 2008, Hx of HTN): Followed by Dr. Excell Seltzer Corinda Gubler). Pt has history of significant CAD despite successfully "ruling out" in the past. Poorly compliant with antihypertensives & lipid lowering medicines. BP improved spontaneously in ED after getting nitro paste but then pt had syncopal episode. - troponin now negative x  3 -cardiology has seen pt and recommended orthostatic vitals, echo, & more affordable blood pressure medicines -continue statin for primary prevention -monitor on telemetry & continue to monitor BP -Nitroglycerin SL prn  -ASA  -added carvedilol 6.25 mg BID to medicine today. Will f/u with pt this PM and ensure he is still feeling well and can ambulate safely without lightheadedness/SOB/chestpain. If feeling well will d/c home on regimen of  carvedilol 6.25mg  BID, HCTZ 25mg  daily, & losartan 100mg  daily as these will be more affordable. -as BP's have been highly variable & pt had syncopal event in ER, will  Consider workup for pheochromocytoma while here in hosp so PCP can f/u on these results.  # Syncope: Likely vasovagal given temporally associated with taking of his BP however not completely clear. No acute EKG changes, no acute neurologic deficits on admission exam or presently. No new cardiac symptoms. Telemetry was sinus tachycardia (2/26@2345 ) during episode then NSR during recovery.  - Per cardiology, syncope was likely secondary to hypotension from nitroglycerin.  - Avoid nitropaste in future  # HLD: Known elevated cholesterol. Not on statin.  - continue lipitor (started during this hospitalization)  # TSH elevated: - difficult to interpret in setting of acute illness, will rec outpt f/u & repeat TSH testing  # Polycythemia: noted on admission. no prior hx of polycythemia. No known reasons.  -Hgb has come down will administration of IVF -continue to monitor  # PSYCH:  -Pt nervous after syncopal event in ED -seems to be doing okay right now  # FEN  -SL -Heart Healthy diet  # PPx: Heparin  # Dispo:  - currently telemetry status - pending ACS evaluation & cardiology consult - also pending clinical improvement  Levert Feinstein, MD [facility] Medicine PGY-1 Service Pager [phone]

## 2012-04-19 ENCOUNTER — Encounter: Payer: Self-pay | Admitting: *Deleted

## 2012-04-19 DIAGNOSIS — K648 Other hemorrhoids: Secondary | ICD-10-CM

## 2012-04-19 HISTORY — DX: Other hemorrhoids: K64.8

## 2012-04-19 NOTE — Discharge Summary (Signed)
Family Medicine Teaching [facility] Discharge Summary  Patient name: [name] Medical record number: [id number] Date of birth: 03-16-1940 Age: 72 y.o. Gender: male Date of Admission: 04/16/2012  Date of Discharge: 04/18/12 Admitting Physician: Nestor Ramp, MD  Primary Care Provider: No primary provider on file.  Indication for Hospitalization: hypertensive crisis, syncope  Discharge Diagnoses:  1. Hypertensive emergency 2. Chest pain 3. Hx of CAD 4. Hypertension 5. Hyperlipidemia 6. Elevated TSH 7. Syncopal episode  Brief Hospital Course:  [name] is a 72 y.o. male who was admitted to the hospital after presenting with complaints of left sided chest pain, headache, and who was noted to be in hypertensive crisis and had a witnessed syncopal episode in the ED.  # CV (HTN Emergency, Chest Pain, CAD s/p stenting in 2008, Hx of HTN) Patient is followed by Dr. Excell Seltzer Corinda Gubler) and has a history of significant CAD despite successfully "ruling out" in the past. Prior to admission he was poorly compliant with his antihypertensives & lipid lowering medicines. His blood pressure spontaneously improved in the ED after getting nitro paste (however, pt then had a syncopal event - see "syncope" problem below). Pt was admitted to the stepdown unit for ACS rule out and cardiology was consulted. He had three negative troponins. Cardiology saw the patient and recommended obtaining orthostatic vital signs, an echocardiogram, and recommended cost-based modifications for pt's outpatient anithypertensive regimen to improve his compliance. We continued a statin for primary prevention and monitored pt on telemetry. His orthostatic vital signs did not show any orthostatic hypotension. On the day of discharge we added a low dose beta blocker (6.25mg  BID) to his medication regimen, of which he tolerated the first dose well. His outpatient regimen was then changed to include this dose of carvedilol,  as well as HCTZ 25mg  daily and losartan 100mg  daily. We do recommend considering an outpatient workup for pheochromocytoma if pt has continued episodes of blood pressure lability/hypertension.   # Syncope: In the ED during our team's admission examination & interview, patient experienced a syncopal event during which he was tachycardic to the 130s and had a blood pressure down into the 50s/30s. Cardiology was consulted after admission, and felt that this event was likely due to a hypotensive episode from the vasodilatory effects of nitropaste. Orthostatic vitals were negative and pt did not have any telemetry events during his hospitalization. Recommend avoiding nitropaste in the future.  # HLD: Pt has known hyperlipidemia and was not on a statin prior to this hospitalization. He was started on lipitor and discharged with this medicine.  # TSH elevated: TSH was checked as part of ACS risk stratification. TSH was elevated at 4.88. Did not pursue any further thyroid testing; recommend this test be repeated as an outpatient and thyroid supplementation be initiated as needed.  # Polycythemia: noted on admission but further lab values subsequently normalized.  Consultations: cardiology  Procedures: none  Significant Labs and Imaging:   CBC  Lab  04/16/12 1703  04/16/12 1728  04/17/12 0200  04/18/12 0600   WBC  8.2  --  12.5*  7.8   HGB  17.3*  17.3*  15.1  14.5   HCT  47.2  51.0  41.5  40.8   PLT  191  --  176  176    BMET  Lab  04/16/12 1728  04/17/12 0200   NA  143  138   K  4.5  3.6   CL  103  102  CO2  --  28   BUN  14  16   CREATININE  0.90  0.98   GLUCOSE  105*  129*   CALCIUM  --  9.3    Lipids:    Component  Value  Date/Time    CHOL  164  04/17/2012 0200    TRIG  108  04/17/2012 0200    HDL  42  04/17/2012 0200    LDLDIRECT  150.8  06/30/2008 1007    VLDL  22  04/17/2012 0200    CHOLHDL  3.9  04/17/2012 0200    LDL 100  POC troponin negative  Real trop negative x3   A1c 5.7  TSH 4.888   CXR:  1. No acute cardiopulmonary disease.  2. Low lung volumes.   Echo:  - Left ventricle: The cavity size was normal. Wall thickness was increased in a pattern of moderate LVH. Systolic function was vigorous. The estimated ejection fraction was in the range of 65% to 70%. Wall motion was normal; there were no regional wall motion abnormalities. Doppler parameters are consistent with abnormal left ventricular relaxation (grade 1 diastolic dysfunction). - Aortic valve: Trivial regurgitation. - Mitral valve: Calcified annulus. - Left atrium: The atrium was mildly dilated.   Discharge Medications:    Medication List    STOP taking these medications       telmisartan-hydrochlorothiazide 80-12.5 MG per tablet  Commonly known as:  MICARDIS HCT      TAKE these medications       aspirin 81 MG chewable tablet  Chew 324 mg by mouth daily.     atorvastatin 20 MG tablet  Commonly known as:  LIPITOR  Take 1 tablet (20 mg total) by mouth at bedtime.     carvedilol 6.25 MG tablet  Commonly known as:  COREG  Take 1 tablet (6.25 mg total) by mouth 2 (two) times daily with a meal.     hydrochlorothiazide 25 MG tablet  Commonly known as:  HYDRODIURIL  Take 1 tablet (25 mg total) by mouth daily.     losartan 100 MG tablet  Commonly known as:  COZAAR  Take 1 tablet (100 mg total) by mouth daily.        Issues for Follow Up:  - ensure pt was able to afford new medications, as we changed his BP medications to be more affordable - given marked fluctuations in BP, one of the diagnoses we entertained was that of pheochromocytoma. We did not do a workup for this as an inpatient, but if pt continues to have hypertensive episodes would recommend considering outpatient workup for pheochromocytoma. - recommend pt have creatinine checked within one week of discharge, since antihypertensives (including ARB) were adjusted - TSH noted to be elevated during this  hospitalization at 4.88. Recommend f/u on this as outpt, will need repeat thyroid testing as this was difficult to interpret in setting of acute illness  Outstanding Results: none      Follow-up Information   Schedule an appointment as soon as possible for a visit with [facility]. (within 3-4 days)    Contact information:   [address] [place] [address] [phone]      Discharge Condition: stable  Discharge Instructions: Please refer to Patient Instructions section of EMR for full details.  Patient was counseled important signs and symptoms that should prompt return to medical care, changes in medications, dietary instructions, activity restrictions, and follow up appointments.    Levert Feinstein,  MD Family Medicine PGY-1

## 2012-04-20 NOTE — ED Provider Notes (Signed)
I saw and evaluated the patient, reviewed the resident's note and I agree with the findings and plan.   Raeford Razor, MD 04/20/12 [phone]

## 2012-04-22 ENCOUNTER — Encounter: Payer: Self-pay | Admitting: *Deleted

## 2012-04-22 DIAGNOSIS — K5730 Diverticulosis of large intestine without perforation or abscess without bleeding: Secondary | ICD-10-CM | POA: Insufficient documentation

## 2012-04-22 HISTORY — DX: Diverticulosis of large intestine without perforation or abscess without bleeding: K57.30

## 2012-04-22 NOTE — Discharge Summary (Signed)
I have reviewed this discharge summary and agree.    

## 2012-05-07 ENCOUNTER — Other Ambulatory Visit (HOSPITAL_COMMUNITY): Payer: Self-pay | Admitting: Family Medicine

## 2012-05-07 ENCOUNTER — Other Ambulatory Visit: Payer: Self-pay

## 2012-05-07 MED ORDER — CARVEDILOL 6.25 MG PO TABS: 6.2500 mg | ORAL_TABLET | Freq: Two times a day (BID) | ORAL | Status: DC

## 2012-05-07 MED ORDER — HYDROCHLOROTHIAZIDE 25 MG PO TABS: 25.0000 mg | ORAL_TABLET | Freq: Every day | ORAL | Status: DC

## 2012-05-07 NOTE — Telephone Encounter (Signed)
Pt has up  Coming appt 05/30/2012 so I gave pt 60tabs no refills DAJ

## 2012-05-07 NOTE — Telephone Encounter (Signed)
No more refills if pt does not keep apt in April

## 2012-05-30 ENCOUNTER — Ambulatory Visit (INDEPENDENT_AMBULATORY_CARE_PROVIDER_SITE_OTHER): Payer: Medicare Other | Admitting: Cardiovascular Disease

## 2012-05-30 ENCOUNTER — Encounter: Payer: Self-pay | Admitting: Cardiovascular Disease

## 2012-05-30 VITALS — BP 190/104 | HR 68 | Ht 66.0 in | Wt 180.4 lb

## 2012-05-30 DIAGNOSIS — E7800 Pure hypercholesterolemia, unspecified: Secondary | ICD-10-CM

## 2012-05-30 DIAGNOSIS — I10 Essential (primary) hypertension: Secondary | ICD-10-CM

## 2012-05-30 MED ORDER — ATORVASTATIN CALCIUM 20 MG PO TABS: 20.0000 mg | ORAL_TABLET | Freq: Every day | ORAL | Status: DC

## 2012-05-30 MED ORDER — LOSARTAN POTASSIUM 100 MG PO TABS: 100.0000 mg | ORAL_TABLET | Freq: Every day | ORAL | Status: DC

## 2012-05-30 NOTE — Patient Instructions (Addendum)
Your physician has recommended you make the following change in your medication: START Losartan 100mg  take one by mouth daily, START Atorvastatin 20mg  take one by mouth daily  Your physician recommends that you schedule a follow-up appointment in: 1 MONTH with the PA/NP for BP follow-up  Your physician recommends that you return for a FASTING LIPID and LIVER profile in 8 WEEKS--nothing to eat or drink after midnight, lab opens at 7:30

## 2012-05-30 NOTE — Progress Notes (Signed)
   HPI:  72 year old gentleman presenting for followup evaluation. The patient has coronary artery disease status post PCI in 2008. He was hospitalized in February with hypertensive urgency. He had been off of his antihypertensive medications because of cost. While in the hospital, he had a reaction to nitroglycerin with marked hypotension. He recovered after a fluid bolus. He otherwise was ruled out for myocardial infarction with serial cardiac enzymes. He required no further inpatient evaluation.  The patient returns for post hospital followup. He denies chest pain or pressure, dyspnea, leg swelling, or palpitations. He only received 2 of his medications when he last went to the pharmacy. His blood pressure today is 190/104. He has not been taking losartan or atorvastatin.  Outpatient Encounter Prescriptions as of 05/30/2012  Medication Sig Dispense Refill  . aspirin 81 MG chewable tablet Chew 324 mg by mouth daily.      . carvedilol (COREG) 6.25 MG tablet Take 1 tablet (6.25 mg total) by mouth 2 (two) times daily with a meal.  60 tablet  0  . hydrochlorothiazide (HYDRODIURIL) 25 MG tablet Take 1 tablet (25 mg total) by mouth daily.  30 tablet  0  . [DISCONTINUED] atorvastatin (LIPITOR) 20 MG tablet Take 1 tablet (20 mg total) by mouth at bedtime.  30 tablet  0  . [DISCONTINUED] losartan (COZAAR) 100 MG tablet Take 1 tablet (100 mg total) by mouth daily.  30 tablet  0   No facility-administered encounter medications on file as of 05/30/2012.    Allergies  Allergen Reactions  . Pork-Derived Products     No pork products    Past Medical History  Diagnosis Date  . Coronary artery disease   . Hypertension     ROS: Negative except as per HPI  BP 190/104  Pulse 68  Ht 5\' 6"  (1.676 m)  Wt 81.829 kg (180 lb 6.4 oz)  BMI 29.13 kg/m2  PHYSICAL EXAM: Pt is alert and oriented, NAD HEENT: normal Neck: JVP - normal, carotids 2+= without bruits Lungs: CTA bilaterally CV: RRR without murmur  or gallop Abd: soft, NT, Positive BS, no hepatomegaly Ext: no C/C/E, distal pulses intact and equal Skin: warm/dry no rash  ASSESSMENT AND PLAN: 1. Malignant hypertension, uncontrolled. We discussed the importance of medication adherence at length. Losartan 100 mg was prescribed and he will start taking this today. He will continue on his current doses of carvedilol and hydrochlorothiazide. He will likely require titration of carvedilol as well and we will arrange a followup visit in one.  2. Coronary artery disease, native vessel. The patient is stable without anginal symptoms. Next changes as above. Continue daily aspirin.  3. Hyperlipidemia. He has a lot of concerns about statin drugs. I explained the benefit in his situation. He understands his most likely cause of major medical problems will be related to cardiovascular disease. I think he would clearly benefit from atorvastatin 20 mg daily as it was prescribed at hospital discharge. He is willing to take this and we will followup lipids and LFTs in 8 weeks.  Tonny Bollman 05/30/2012 1:11 PM

## 2012-06-02 ENCOUNTER — Telehealth: Payer: Self-pay | Admitting: Cardiovascular Disease

## 2012-06-02 NOTE — Telephone Encounter (Signed)
New problem      Pt's wife need to speak to nurse concerning pt's BP is still very high and [employer] denied for paying hospital bill and stated [facility] may need more documentation. Please call pt's wife

## 2012-06-02 NOTE — Telephone Encounter (Signed)
I spoke with the pt's wife and she will bring the letter from the insurance company into our office.  I will give this to the billing department when it arrives. The pt's BP had improved until he got this letter.

## 2012-06-03 NOTE — Telephone Encounter (Signed)
The pt and his wife brought letter into the office about denial from insurance.  This letter was given to Sue Lush the office manager and she will forward to billing to handle.  I did make her aware that the pt would like to be contacted about the status of this denial.

## 2012-06-18 ENCOUNTER — Other Ambulatory Visit: Payer: Self-pay | Admitting: Cardiovascular Disease

## 2012-06-18 ENCOUNTER — Other Ambulatory Visit: Payer: Self-pay

## 2012-06-18 DIAGNOSIS — E7800 Pure hypercholesterolemia, unspecified: Secondary | ICD-10-CM

## 2012-06-18 DIAGNOSIS — I10 Essential (primary) hypertension: Secondary | ICD-10-CM

## 2012-06-18 NOTE — Telephone Encounter (Signed)
Pt wife states he is out of these medications completely. States his BP is 169/90.

## 2012-06-18 NOTE — Telephone Encounter (Signed)
Please review for refill and elevated blood pressure.

## 2012-06-19 ENCOUNTER — Telehealth: Payer: Self-pay

## 2012-06-19 ENCOUNTER — Other Ambulatory Visit: Payer: Self-pay | Admitting: *Deleted

## 2012-06-19 DIAGNOSIS — E7800 Pure hypercholesterolemia, unspecified: Secondary | ICD-10-CM

## 2012-06-19 DIAGNOSIS — I10 Essential (primary) hypertension: Secondary | ICD-10-CM

## 2012-06-19 MED ORDER — LOSARTAN POTASSIUM 100 MG PO TABS: 100.0000 mg | ORAL_TABLET | Freq: Every day | ORAL | Status: DC

## 2012-06-19 MED ORDER — HYDROCHLOROTHIAZIDE 25 MG PO TABS: 25.0000 mg | ORAL_TABLET | Freq: Every day | ORAL | Status: DC

## 2012-06-19 MED ORDER — CARVEDILOL 6.25 MG PO TABS: 6.2500 mg | ORAL_TABLET | Freq: Two times a day (BID) | ORAL | Status: DC

## 2012-06-19 NOTE — Telephone Encounter (Signed)
Did you see the blood pressure in this refill note? Please contact patient. This is Dr. Earmon Phoenix patient.

## 2012-06-19 NOTE — Telephone Encounter (Signed)
Probably because he is out of his meds per wife. Refill the meds and send the message to Julieta Gutting, RN ----- Message ----- From: Festus Aloe, CMA Sent: 06/19/2012 10:08 AM To: Waymon Budge, LPN ----- Message ----- From: Waymon Budge, LPN Sent: 9/56/2130 4:38 PM To: Festus Aloe, CMA Yes, per very recent OV pt should be taking these meds. Ok to refill. ----- Message ----- From: Festus Aloe, CMA Sent: 06/18/2012 4:32 PM To: Donne Hazel Triage   Yes, per very recent OV pt should be taking these meds. Ok to refill. ----- Message ----- From: Festus Aloe, CMA Sent: 06/18/2012 4:32 PM To: Donne Hazel Triage ----- Message ----- From: Sandre Kitty Sent: 06/18/2012 2:44 PM To: Festus Aloe, CMA

## 2012-06-19 NOTE — Telephone Encounter (Signed)
  Sandip, Power - 06/18/2012 2:39 PM More Detail >>      Waymon Budge, LPN      Sent: Wed June 18, 2012  4:38 PM    To: Festus Aloe, CMA          Message    Yes, per very recent OV pt should be taking these meds.  Ok to refill.    ----- Message -----       From: Festus Aloe, CMA       Sent: 06/18/2012   4:32 PM         To: Donne Hazel Triage                   ----- Message -----       From: Sandre Kitty       Sent: 06/18/2012   2:44 PM         To: Festus Aloe, CMA                           Medications     Pending      Disp Refills Start End    hydrochlorothiazide (HYDRODIURIL) 25 MG tablet 30 tablet 0 06/18/2012      Sig - Route:  Take 1 tablet (25 mg total) by mouth daily. - Oral    Comment:  Pt has an up coming ov in April 2014    losartan (COZAAR) 100 MG tablet 90 tablet 3 06/18/2012      Sig - Route:  Take 1 tablet (100 mg total) by mouth daily. - Oral    carvedilol (COREG) 6.25 MG tablet 60 tablet 0 06/18/2012      Sig - Route:  Take 1 tablet (6.25 mg total) by mouth 2 (two) times daily with a meal. - Oral      Call Documentation    Festus Aloe, CMA at 06/19/2012 10:08 AM    Status: Signed                   Did you see the blood pressure in this refill note? Please contact patient. This is Dr. Earmon Phoenix patient.           Festus Aloe, CMA at 06/18/2012  4:32 PM    Status: Signed                   Please review for refill and elevated blood pressure.           Sandre Kitty at 06/18/2012  2:44 PM    Status: Signed                   Pt wife states he is out of these medications completely. States his BP is 169/90.

## 2012-06-19 NOTE — Telephone Encounter (Signed)
Left message for pt informing them of approved and sent in refills.  Advised pt to keep a check on bp and report back to Lauren in a week.

## 2012-06-30 ENCOUNTER — Ambulatory Visit (INDEPENDENT_AMBULATORY_CARE_PROVIDER_SITE_OTHER): Payer: Medicare Other | Admitting: Cardiovascular Disease

## 2012-06-30 ENCOUNTER — Ambulatory Visit: Payer: Medicare Other | Admitting: Physician Assistant

## 2012-06-30 ENCOUNTER — Encounter: Payer: Self-pay | Admitting: Cardiovascular Disease

## 2012-06-30 VITALS — BP 134/84 | HR 64 | Ht 66.0 in | Wt 181.0 lb

## 2012-06-30 DIAGNOSIS — I10 Essential (primary) hypertension: Secondary | ICD-10-CM

## 2012-06-30 NOTE — Progress Notes (Signed)
   HPI:  72 year old gentleman presenting for followup evaluation. The patient has coronary artery disease status post PCI in 2008. He was hospitalized in February of this year with hypertensive emergency. He had run out of his medications and had limited access because of cost. When he was seen last month in the office, he was noted to have severe hypertension with blood pressure 190/104. His medications were adjusted. He presents today for followup evaluation.  He is doing much better. He has some morning readings at 150/90. These are the highest blood pressures he is seen. Most of his readings are in the 120s 130s over 70s. He denies chest pain, chest pressure, dyspnea, or edema. He's tolerating atorvastatin without muscle aches.  Outpatient Encounter Prescriptions as of 06/30/2012  Medication Sig Dispense Refill  . aspirin 81 MG chewable tablet Chew 324 mg by mouth daily.      Marland Kitchen atorvastatin (LIPITOR) 20 MG tablet Take 1 tablet (20 mg total) by mouth daily.  90 tablet  3  . carvedilol (COREG) 6.25 MG tablet Take 1 tablet (6.25 mg total) by mouth 2 (two) times daily with a meal.  180 tablet  3  . hydrochlorothiazide (HYDRODIURIL) 25 MG tablet Take 1 tablet (25 mg total) by mouth daily.  90 tablet  3  . losartan (COZAAR) 100 MG tablet Take 1 tablet (100 mg total) by mouth daily.  90 tablet  3  . [DISCONTINUED] carvedilol (COREG) 6.25 MG tablet TAKE ONE TABLET BY MOUTH TWICE DAILY WITH MEALS  60 tablet  3   No facility-administered encounter medications on file as of 06/30/2012.    Allergies  Allergen Reactions  . Pork-Derived Products     No pork products    Past Medical History  Diagnosis Date  . Coronary artery disease   . Hypertension     ROS: Negative except as per HPI  BP 134/84  Pulse 64  Ht 5\' 6"  (1.676 m)  Wt 82.101 kg (181 lb)  BMI 29.23 kg/m2  SpO2 98%  PHYSICAL EXAM: Pt is alert and oriented, NAD HEENT: normal Neck: JVP - normal, carotids 2+= without bruits Lungs:  CTA bilaterally CV: RRR without murmur or gallop Abd: soft, NT, Positive BS, no hepatomegaly Ext: no C/C/E, distal pulses intact and equal Skin: warm/dry no rash  ASSESSMENT AND PLAN: 1. hypertension, malignant. The patient is improved with excellent medical compliance. He will continue on a combination of carvedilol, hydrochlorothiazide, and losartan at his current doses. I will see him back in one year.  2. Hyperlipidemia. He's taking atorvastatin. It was a bit of a challenge to get him on a statin drug, but he is doing fine on this so far. He has lipids and LFTs scheduled June 6. Will notify him after the results are available.  Overall he is doing much better from a cardiovascular perspective. I'll see him back in one year.  Tonny Bollman 06/30/2012 11:03 AM

## 2012-06-30 NOTE — Patient Instructions (Signed)
Please keep appointment already scheduled for fasting lab work--nothing to eat or drink after midnight  Your physician wants you to follow-up in: 1 YEAR with Dr Excell Seltzer.  You will receive a reminder letter in the mail two months in advance. If you don't receive a letter, please call our office to schedule the follow-up appointment.  Your physician recommends that you continue on your current medications as directed. Please refer to the Current Medication list given to you today.

## 2012-07-25 ENCOUNTER — Other Ambulatory Visit (INDEPENDENT_AMBULATORY_CARE_PROVIDER_SITE_OTHER): Payer: Medicare Other

## 2012-07-25 DIAGNOSIS — I10 Essential (primary) hypertension: Secondary | ICD-10-CM

## 2012-07-25 DIAGNOSIS — E7800 Pure hypercholesterolemia, unspecified: Secondary | ICD-10-CM

## 2012-07-25 LAB — LIPID PANEL
HDL: 37.9 mg/dL — ABNORMAL LOW (ref >39.00)
LDL Cholesterol: 51 mg/dL (ref 0–99)
Total CHOL/HDL Ratio: 3
Triglycerides: 60 mg/dL (ref 0.0–149.0)
VLDL: 12 mg/dL (ref 0.0–40.0)

## 2012-07-25 LAB — HEPATIC FUNCTION PANEL
AST: 16 U/L (ref 0–37)
Albumin: 3.5 g/dL (ref 3.5–5.2)
Total Bilirubin: 1 mg/dL (ref 0.3–1.2)

## 2013-06-01 ENCOUNTER — Ambulatory Visit (INDEPENDENT_AMBULATORY_CARE_PROVIDER_SITE_OTHER): Payer: Medicare Other | Admitting: Family Medicine

## 2013-06-01 VITALS — BP 134/80 | HR 69 | Temp 97.6°F | Resp 16 | Ht 66.5 in | Wt 182.4 lb

## 2013-06-01 DIAGNOSIS — Z125 Encounter for screening for malignant neoplasm of prostate: Secondary | ICD-10-CM

## 2013-06-01 DIAGNOSIS — S0500XA Injury of conjunctiva and corneal abrasion without foreign body, unspecified eye, initial encounter: Secondary | ICD-10-CM

## 2013-06-01 DIAGNOSIS — E785 Hyperlipidemia, unspecified: Secondary | ICD-10-CM

## 2013-06-01 DIAGNOSIS — I10 Essential (primary) hypertension: Secondary | ICD-10-CM

## 2013-06-01 DIAGNOSIS — Z0000 Encounter for general adult medical examination without abnormal findings: Secondary | ICD-10-CM

## 2013-06-01 DIAGNOSIS — S058X9A Other injuries of unspecified eye and orbit, initial encounter: Secondary | ICD-10-CM

## 2013-06-01 DIAGNOSIS — J309 Allergic rhinitis, unspecified: Secondary | ICD-10-CM

## 2013-06-01 LAB — POCT CBC
Granulocyte percent: 68.9 %G (ref 37–80)
HCT, POC: 46.8 % (ref 43.5–53.7)
Hemoglobin: 15.1 g/dL (ref 14.1–18.1)
Lymph, poc: 2.3 (ref 0.6–3.4)
MCH, POC: 30.8 pg (ref 27–31.2)
MCHC: 32.3 g/dL (ref 31.8–35.4)
MCV: 95.6 fL (ref 80–97)
MID (cbc): 0.6 (ref 0–0.9)
MPV: 9.2 fL (ref 0–99.8)
POC Granulocyte: 6.5 (ref 2–6.9)
POC LYMPH PERCENT: 24.7 %L (ref 10–50)
POC MID %: 6.4 %M (ref 0–12)
Platelet Count, POC: 229 10*3/uL (ref 142–424)
RBC: 4.9 M/uL (ref 4.69–6.13)
RDW, POC: 14.5 %
WBC: 9.4 10*3/uL (ref 4.6–10.2)

## 2013-06-01 LAB — POCT URINALYSIS DIPSTICK
Bilirubin, UA: NEGATIVE
Blood, UA: NEGATIVE
Glucose, UA: NEGATIVE
Leukocytes, UA: NEGATIVE
Nitrite, UA: NEGATIVE
Protein, UA: NEGATIVE
Spec Grav, UA: >=1.03
Urobilinogen, UA: 0.2
pH, UA: 5.5

## 2013-06-01 MED ORDER — TOBRAMYCIN 0.3 % OP OINT: 1.0000 "application " | TOPICAL_OINTMENT | Freq: Three times a day (TID) | OPHTHALMIC | Status: DC

## 2013-06-01 NOTE — Patient Instructions (Signed)
Try using flonase nasal spray (OTC) at night   Allergic Rhinitis Allergic rhinitis is when the mucous membranes in the nose respond to allergens. Allergens are particles in the air that cause your body to have an allergic reaction. This causes you to release allergic antibodies. Through a chain of events, these eventually cause you to release histamine into the blood stream. Although meant to protect the body, it is this release of histamine that causes your discomfort, such as frequent sneezing, congestion, and an itchy, runny nose.  CAUSES  Seasonal allergic rhinitis (hay fever) is caused by pollen allergens that may come from grasses, trees, and weeds. Year-round allergic rhinitis (perennial allergic rhinitis) is caused by allergens such as house dust mites, pet dander, and mold spores.  SYMPTOMS   Nasal stuffiness (congestion).  Itchy, runny nose with sneezing and tearing of the eyes. DIAGNOSIS  Your health care provider can help you determine the allergen or allergens that trigger your symptoms. If you and your health care provider are unable to determine the allergen, skin or blood testing may be used. TREATMENT  Allergic Rhinitis does not have a cure, but it can be controlled by:  Medicines and allergy shots (immunotherapy).  Avoiding the allergen. Hay fever may often be treated with antihistamines in pill or nasal spray forms. Antihistamines block the effects of histamine. There are over-the-counter medicines that may help with nasal congestion and swelling around the eyes. Check with your health care provider before taking or giving this medicine.  If avoiding the allergen or the medicine prescribed do not work, there are many new medicines your health care provider can prescribe. Stronger medicine may be used if initial measures are ineffective. Desensitizing injections can be used if medicine and avoidance does not work. Desensitization is when a patient is given ongoing shots until  the body becomes less sensitive to the allergen. Make sure you follow up with your health care provider if problems continue. HOME CARE INSTRUCTIONS It is not possible to completely avoid allergens, but you can reduce your symptoms by taking steps to limit your exposure to them. It helps to know exactly what you are allergic to so that you can avoid your specific triggers. SEEK MEDICAL CARE IF:   You have a fever.  You develop a cough that does not stop easily (persistent).  You have shortness of breath.  You start wheezing.  Symptoms interfere with normal daily activities. Document Released: 10/31/2000 Document Revised: 11/26/2012 Document Reviewed: 10/13/2012 [facility] Patient Information 2014 [place]. Corneal Abrasion The cornea is the clear covering at the front and center of the eye. When looking at the colored portion of the eye (iris), you are looking through the cornea. This very thin tissue is made up of many layers. The surface layer is a single layer of cells (corneal epithelium) and is one of the most sensitive tissues in the body. If a scratch or injury causes the corneal epithelium to come off, it is called a corneal abrasion. If the injury extends to the tissues below the epithelium, the condition is called a corneal ulcer. CAUSES   Scratches.  Trauma.  Foreign body in the eye. Some people have recurrences of abrasions in the area of the original injury even after it has healed (recurrent erosion syndrome). Recurrent erosion syndrome generally improves and goes away with time. SYMPTOMS   Eye pain.  Difficulty or inability to keep the injured eye open.  The eye becomes very sensitive to light.  Recurrent erosions  tend to happen suddenly, first thing in the morning, usually after waking up and opening the eye. DIAGNOSIS  Your health care provider can diagnose a corneal abrasion during an eye exam. Dye is usually placed in the eye using a drop or a small paper  strip moistened by your tears. When the eye is examined with a special light, the abrasion shows up clearly because of the dye. TREATMENT   Small abrasions may be treated with antibiotic drops or ointment alone.  Usually a pressure patch is specially applied. Pressure patches prevent the eye from blinking, allowing the corneal epithelium to heal. A pressure patch also reduces the amount of pain present in the eye during healing. Most corneal abrasions heal within 2 3 days with no effect on vision. If the abrasion becomes infected and spreads to the deeper tissues of the cornea, a corneal ulcer can result. This is serious because it can cause corneal scarring. Corneal scars interfere with light passing through the cornea and cause a loss of vision in the involved eye. HOME CARE INSTRUCTIONS  Use medicine or ointment as directed. Only take over-the-counter or prescription medicines for pain, discomfort, or fever as directed by your health care provider.  Do not drive or operate machinery while your eye is patched. Your ability to judge distances is impaired.  If your health care provider has given you a follow-up appointment, it is very important to keep that appointment. Not keeping the appointment could result in a severe eye infection or permanent loss of vision. If there is any problem keeping the appointment, let your health care provider know. SEEK MEDICAL CARE IF:   You have pain, light sensitivity, and a scratchy feeling in one eye or both eyes.  Your pressure patch keeps loosening up, and you can blink your eye under the patch after treatment.  Any kind of discharge develops from the eye after treatment or if the lids stick together in the morning.  You have the same symptoms in the morning as you did with the original abrasion days, weeks, or months after the abrasion healed. MAKE SURE YOU:   Understand these instructions.  Will watch your condition.  Will get help right away if  you are not doing well or get worse. Document Released: 02/03/2000 Document Revised: 11/26/2012 Document Reviewed: 10/13/2012 [facility] Patient Information 2014 [place].

## 2013-06-01 NOTE — Progress Notes (Signed)
° °  Subjective:    Patient ID: [name], male    DOB: 24-Nov-1940, 73 y.o.   MRN: [id number] This chart was scribed for Robyn Haber, MD by Terressa Koyanagi, ED Scribe at [facility]. This patient was seen in room 4 and the patient's care was started at 12:09 PM.  HPI  HPI Comments: [name] is a 73 y.o. male, with a history of HLD, HTN, CAD, chest pain, syncope and hypertensive urgency (malignant), who presents to the [facility] with his wife, complaining of a foreign object in his eye with associated eye redness and pain. Pt reports he was putting mulch on his lawn yesterday when a pine needle went into his eye. Pt reports he put drops in his eye without relief.  For his secondary complaint, pt requests that his cholesterol and other values be checked. Pt reports that his cholesterol has not been checked for 2 years.   For pt's third complaint pt reports intermittent nasal congestion with associated snoring and dry mouth. Pt denies taking any measures to alleviate his symptoms.    Review of Systems  HENT:       Nasal congestion and snoring; dry mouth    Eyes: Positive for pain and redness.  All other systems reviewed and are negative.  Objective:  Physical Exam No acute distress, seen with wife Left eye is injected. Lid was everted and no foreign body was noted EOM intact Pupils equal and reactive After proparacaine drops, fluorescein instilled and patient does have some uptake on the medial iris. Chest: Clear Heart: Regular no murmur Neck: Supple no adenopathy Abdomen: Soft nontender without HSM     Assessment & Plan:  DIAGNOSTIC STUDIES: Oxygen Saturation is 97% on RA, adequate by my interpretation.    COORDINATION OF CARE:  12:14 PM-Discussed treatment plan which includes eye drops, ointment in the eye, flonase and labs, with pt at bedside and pt agreed to plan.  Corneal abrasion - Plan: tobramycin (TOBREX) 0.3 %  ophthalmic ointment  Allergic rhinitis  Annual physical exam - Plan: POCT CBC, Comprehensive metabolic panel, Lipid panel, POCT urinalysis dipstick, PSA  Signed, Robyn Haber, MD

## 2013-06-02 LAB — COMPREHENSIVE METABOLIC PANEL
ALT: 18 U/L (ref 0–53)
AST: 22 U/L (ref 0–37)
Albumin: 4.1 g/dL (ref 3.5–5.2)
Alkaline Phosphatase: 59 U/L (ref 39–117)
BUN: 21 mg/dL (ref 6–23)
CO2: 29 mEq/L (ref 19–32)
Calcium: 9.7 mg/dL (ref 8.4–10.5)
Chloride: 105 mEq/L (ref 96–112)
Creat: 0.94 mg/dL (ref 0.50–1.35)
Glucose, Bld: 106 mg/dL — ABNORMAL HIGH (ref 70–99)
Potassium: 4.3 mEq/L (ref 3.5–5.3)
Sodium: 143 mEq/L (ref 135–145)
Total Bilirubin: 0.8 mg/dL (ref 0.2–1.2)
Total Protein: 6.9 g/dL (ref 6.0–8.3)

## 2013-06-02 LAB — LIPID PANEL
Cholesterol: 196 mg/dL (ref 0–200)
HDL: 43 mg/dL (ref >39)
LDL Cholesterol: 122 mg/dL — ABNORMAL HIGH (ref 0–99)
Total CHOL/HDL Ratio: 4.6 Ratio
Triglycerides: 155 mg/dL — ABNORMAL HIGH (ref <150)
VLDL: 31 mg/dL (ref 0–40)

## 2013-06-02 LAB — PSA: PSA: 1.54 ng/mL (ref <=4.00)

## 2013-06-19 ENCOUNTER — Other Ambulatory Visit: Payer: Self-pay | Admitting: Cardiovascular Disease

## 2013-06-19 ENCOUNTER — Other Ambulatory Visit: Payer: Self-pay

## 2013-06-19 MED ORDER — CARVEDILOL 6.25 MG PO TABS: 6.2500 mg | ORAL_TABLET | Freq: Two times a day (BID) | ORAL | Status: DC

## 2013-07-07 ENCOUNTER — Encounter: Payer: Medicare Other | Admitting: Physician Assistant

## 2013-07-07 NOTE — Progress Notes (Deleted)
[facility], [address] [place], [place]  [address] Phone: [phone] Fax:  [phone]  Date:  07/07/2013   ID:  [name], DOB 26-Apr-1940, MRN [id number]  PCP:  No primary provider on file.  Cardiologist:  Dr. Sherren Mocha   Electrophysiologist:  ***   History of Present Illness: [name] is a 73 y.o. male with a hx of CAD s/p PCI with BMS to the CFX and BMS to OM1 with residual diffuse disease in the LAD treated medically (poor target for CABG), HTN heart disease, HL.  He was admitted last year with a HTN emergency (out of meds due to cost).  Last seen by Dr. Sherren Mocha in 06/2012.    Studies:  - LHC (03/2006):  Ostial LAD 50%, prox LAD 80%, dist LAD 60-70% (LAD poor target for CABG), AVCFX 99%, prox OM1 70-80%, dist OM1 90%, prox RCA 30%, ostial PDA 90%, EF 70%, normal renal arteries.   PCI:  3 x 12 mm Liberte BMS to CFX, 3 x 12 mm, Liberte BMS to OM.  - Echo (03/2012):  Mod LVH, vigorous LVF, EF 65-70%, no RWMA, Gr 1 DD, MAC, mild LAE.      Recent Labs: 06/01/2013: ALT 18; Creatinine 0.94; HDL Cholesterol by NMR 43; Hemoglobin 15.1; LDL (calc) 122*; Potassium 4.3   Wt Readings from Last 3 Encounters:  06/01/13 182 lb 6.4 oz (82.736 kg)  06/30/12 181 lb (82.101 kg)  05/30/12 180 lb 6.4 oz (81.829 kg)     Past Medical History  Diagnosis Date  . Coronary artery disease   . Hypertension     Current Outpatient Prescriptions  Medication Sig Dispense Refill  . aspirin 81 MG chewable tablet Chew 324 mg by mouth daily.      Marland Kitchen atorvastatin (LIPITOR) 20 MG tablet Take 1 tablet (20 mg total) by mouth daily.  90 tablet  3  . carvedilol (COREG) 6.25 MG tablet Take 1 tablet (6.25 mg total) by mouth 2 (two) times daily with a meal.  60 tablet  0  . hydrochlorothiazide (HYDRODIURIL) 25 MG tablet TAKE ONE TABLET BY MOUTH ONCE DAILY  90 tablet  0  . losartan (COZAAR) 100 MG tablet Take 1 tablet (100 mg total) by mouth daily.  90 tablet  3  . tobramycin (TOBREX) 0.3  % ophthalmic ointment Place 1 application into the left eye 3 (three) times daily.  3.5 g  0   No current facility-administered medications for this visit.    Allergies:   Pork-derived products   Social History:  The patient  reports that he has never smoked. He does not have any smokeless tobacco history on file. He reports that he does not drink alcohol or use illicit drugs.   Family History:  The patient's family history includes Cancer in his sister; Heart disease in his mother.   ROS:  Please see the history of present illness.   ***   All other systems reviewed and negative.   PHYSICAL EXAM: VS:  There were no vitals taken for this visit. Well nourished, well developed, in no acute distress HEENT: normal Neck: ***no JVD Cardiac:  normal S1, S2; ***RRR; no murmur Lungs:  ***clear to auscultation bilaterally, no wheezing, rhonchi or rales Abd: soft, nontender, no hepatomegaly Ext: ***no edema Skin: warm and dry Neuro:  CNs 2-12 intact, no focal abnormalities noted  EKG:  ***     ASSESSMENT AND PLAN:  Coronary Artery Disease s/p BMS to CFX and OM1 in  2008  Hypertensive heart disease  Hyperlipidemia ***  Signed, Richardson Dopp, PA-C  07/07/2013 2:14 PM

## 2013-07-09 NOTE — Progress Notes (Signed)
This encounter was created in error - please disregard.

## 2013-07-21 ENCOUNTER — Encounter: Payer: Self-pay | Admitting: Cardiovascular Disease

## 2013-07-21 ENCOUNTER — Ambulatory Visit (INDEPENDENT_AMBULATORY_CARE_PROVIDER_SITE_OTHER): Payer: Medicare Other | Admitting: Cardiovascular Disease

## 2013-07-21 ENCOUNTER — Encounter (INDEPENDENT_AMBULATORY_CARE_PROVIDER_SITE_OTHER): Payer: Self-pay

## 2013-07-21 VITALS — BP 152/100 | HR 63 | Ht 66.5 in | Wt 183.8 lb

## 2013-07-21 DIAGNOSIS — I2510 Atherosclerotic heart disease of native coronary artery without angina pectoris: Secondary | ICD-10-CM

## 2013-07-21 DIAGNOSIS — E785 Hyperlipidemia, unspecified: Secondary | ICD-10-CM

## 2013-07-21 MED ORDER — SIMVASTATIN 20 MG PO TABS: 20.0000 mg | ORAL_TABLET | Freq: Every day | ORAL | Status: DC

## 2013-07-21 MED ORDER — CARVEDILOL 12.5 MG PO TABS: 12.5000 mg | ORAL_TABLET | Freq: Two times a day (BID) | ORAL | Status: DC

## 2013-07-21 NOTE — Patient Instructions (Addendum)
Your physician wants you to follow-up in: 6 MONTHS with Dr Burt Knack.  You will receive a reminder letter in the mail two months in advance. If you don't receive a letter, please call our office to schedule the follow-up appointment.  Your physician has recommended you make the following change in your medication: 1. INCREASE Carvedilol to 12.5mg  take one by mouth twice a day 2. START Simvastatin 20mg  take one by mouth every evening  Your physician recommends that you return for a FASTING LIPID and LIVER profile in 3 MONTHS--nothing to eat or drink after midnight, lab opens at 7:30 AM

## 2013-07-21 NOTE — Progress Notes (Signed)
    HPI:  73 year old gentleman presenting for followup evaluation. The patient is followed for coronary artery disease with history of PCI in 2008. He presented again and 2014 with hypertensive urgency. Antihypertensive medications were adjusted at that time. He presents today for regularly scheduled 1 year followup of these problems. Lipids were last checked in April 2015. At that time his cholesterol is 196, triglycerides 155, HDL 43, and LDL 122. These are much higher than his reviewed his readings one year earlier when his total cholesterol is 101, LDL was 51.  The patient has had no chest pain. He does complain of exercise intolerance and has fatigue with walking uphill. He has mild shortness of breath. He denies edema, orthopnea, or PND. He quit taking statin medications because he had a reaction to atorvastatin. He describes skin rash and cough. Home blood pressures have been in the 140s and 150s.    Outpatient Encounter Prescriptions as of 07/21/2013  Medication Sig  . aspirin 81 MG chewable tablet Chew 324 mg by mouth daily.  Marland Kitchen atorvastatin (LIPITOR) 20 MG tablet Take 1 tablet (20 mg total) by mouth daily.  . carvedilol (COREG) 6.25 MG tablet Take 1 tablet (6.25 mg total) by mouth 2 (two) times daily with a meal.  . hydrochlorothiazide (HYDRODIURIL) 25 MG tablet TAKE ONE TABLET BY MOUTH ONCE DAILY  . losartan (COZAAR) 100 MG tablet Take 1 tablet (100 mg total) by mouth daily.  Marland Kitchen tobramycin (TOBREX) 0.3 % ophthalmic ointment Place 1 application into the left eye 3 (three) times daily.    Allergies  Allergen Reactions  . Pork-Derived Products     No pork products    Past Medical History  Diagnosis Date  . Coronary artery disease   . Hypertension     ROS: Negative except as per HPI  Ht 5' 6.5" (1.689 m)  PHYSICAL EXAM: Pt is alert and oriented, NAD HEENT: normal Neck: JVP - normal, carotids 2+= without bruits Lungs: CTA bilaterally CV: RRR without murmur or gallop Abd:  soft, NT, Positive BS, no hepatomegaly Ext: no C/C/E, distal pulses intact and equal Skin: warm/dry no rash  EKG:  Normal sinus rhythm 63 beats per minute, within normal limits.  ASSESSMENT AND PLAN: 1. Coronary artery disease, native vessel. The patient underwent PCI of the left circumflex back in 2008. He was noted to have diffuse LAD stenosis at that time. He is not experiencing any anginal chest pain. He will continue on aspirin, beta blocker, and will resume a statin drug (see below).  2. Malignant hypertension, uncontrolled. Recommend double carvedilol to 12.5 mg twice daily. He will continue to monitor home blood pressures and call if they remain elevated. Otherwise I will see him back in 6 months.  3. Hyperlipidemia. Lipids are viewed as above. Resume simvastatin 20 mg daily. Followup lipids and LFTs in 3 months.  Sherren Mocha, MD 07/21/2013 2:01 PM

## 2013-09-21 ENCOUNTER — Other Ambulatory Visit: Payer: Self-pay | Admitting: Cardiovascular Disease

## 2013-10-21 ENCOUNTER — Other Ambulatory Visit (INDEPENDENT_AMBULATORY_CARE_PROVIDER_SITE_OTHER): Payer: Medicare Other

## 2013-10-21 DIAGNOSIS — I2510 Atherosclerotic heart disease of native coronary artery without angina pectoris: Secondary | ICD-10-CM

## 2013-10-21 DIAGNOSIS — E785 Hyperlipidemia, unspecified: Secondary | ICD-10-CM

## 2013-10-21 LAB — LIPID PANEL
CHOLESTEROL: 148 mg/dL (ref 0–200)
HDL: 38.8 mg/dL — ABNORMAL LOW (ref >39.00)
LDL CALC: 78 mg/dL (ref 0–99)
NonHDL: 109.2
TRIGLYCERIDES: 155 mg/dL — AB (ref 0.0–149.0)
Total CHOL/HDL Ratio: 4
VLDL: 31 mg/dL (ref 0.0–40.0)

## 2013-10-21 LAB — HEPATIC FUNCTION PANEL
ALK PHOS: 52 U/L (ref 39–117)
ALT: 25 U/L (ref 0–53)
AST: 23 U/L (ref 0–37)
Albumin: 3.7 g/dL (ref 3.5–5.2)
BILIRUBIN DIRECT: 0.1 mg/dL (ref 0.0–0.3)
BILIRUBIN TOTAL: 0.7 mg/dL (ref 0.2–1.2)
TOTAL PROTEIN: 7.3 g/dL (ref 6.0–8.3)

## 2013-10-27 ENCOUNTER — Encounter: Payer: Self-pay | Admitting: Cardiovascular Disease

## 2013-10-27 NOTE — Telephone Encounter (Signed)
This encounter was created in error - please disregard.

## 2013-10-27 NOTE — Telephone Encounter (Signed)
New message  Pt wife called. Requests to have lab results mailed home//sr

## 2014-01-04 ENCOUNTER — Encounter: Payer: Self-pay | Admitting: Cardiovascular Disease

## 2014-01-04 ENCOUNTER — Ambulatory Visit (INDEPENDENT_AMBULATORY_CARE_PROVIDER_SITE_OTHER): Payer: Medicare Other | Admitting: Cardiovascular Disease

## 2014-01-04 VITALS — BP 160/100 | HR 53 | Ht 66.0 in | Wt 183.0 lb

## 2014-01-04 DIAGNOSIS — E785 Hyperlipidemia, unspecified: Secondary | ICD-10-CM

## 2014-01-04 DIAGNOSIS — I10 Essential (primary) hypertension: Secondary | ICD-10-CM

## 2014-01-04 NOTE — Patient Instructions (Addendum)
Your physician has requested that you regularly monitor and record your blood pressure readings at home. Please use the same machine at the same time of day to check your readings and record them to bring to your follow-up visit. Please contact the office if your BP is consistently above 140/90.   Your physician recommends that you return for a FASTING LIPID, LIVER and BMP in 6 MONTHS--nothing to eat or drink after midnight, lab opens at 7:30 AM (please do labs one week prior to your appointment with Dr Burt Knack)  Your physician wants you to follow-up in: 30 MONTHS with Dr Burt Knack.  You will receive a reminder letter in the mail two months in advance. If you don't receive a letter, please call our office to schedule the follow-up appointment.  Your physician recommends that you continue on your current medications as directed. Please refer to the Current Medication list given to you today.

## 2014-01-04 NOTE — Progress Notes (Signed)
Background: the patient is followed for coronary artery disease. He presented with unstable angina in 2008. He underwent PCI in the proximal left circumflex and first obtuse marginal branches with bare metal stents. He was noted to have diffuse LAD stenosis and moderate stenosis at the ostium of the PDA. Medical therapy was recommended for his residual CAD as his LAD was felt to be a poor target for bypass grafting. He has also had issues with malignant hypertension. He presented with hypertensive  Urgency in 2011 and was hospitalized.  HPI:  73 year old gentleman presenting for follow-up evaluation. The patient is doing well from a cardiac perspective. His blood pressure is elevated today, but he did not take his medications this morning. He does not like to take his medicines on an empty stomach. He has had no recent headache, vision changes, chest pain, chest pressure, shortness of breath, or leg swelling. He's had no lightheadedness, dizziness, or syncope. He feels well. He limits sodium in his diet.  Studies:  Labs 10/21/2013: Cholesterol 148, triglycerides 155, HDL 39, LDL 78. Liver function tests within normal limits.  2-D echocardiogram 04/17/2012: Study Conclusions  - Left ventricle: The cavity size was normal. Wall thickness was increased in a pattern of moderate LVH. Systolic function was vigorous. The estimated ejection fraction was in the range of 65% to 70%. Wall motion was normal; there were no regional wall motion abnormalities. Doppler parameters are consistent with abnormal left ventricular relaxation (grade 1 diastolic dysfunction). - Aortic valve: Trivial regurgitation. - Mitral valve: Calcified annulus. - Left atrium: The atrium was mildly dilated.  Outpatient Encounter Prescriptions as of 01/04/2014  Medication Sig  . aspirin 81 MG chewable tablet Chew 81 mg by mouth daily.   . carvedilol (COREG) 12.5 MG tablet Take 1 tablet (12.5 mg total) by mouth 2  (two) times daily with a meal.  . hydrochlorothiazide (HYDRODIURIL) 25 MG tablet TAKE ONE TABLET BY MOUTH ONCE DAILY  . simvastatin (ZOCOR) 20 MG tablet Take 1 tablet (20 mg total) by mouth at bedtime.    Allergies  Allergen Reactions  . Lipitor [Atorvastatin] Shortness Of Breath  . Pork-Derived Products     No pork products    Past Medical History  Diagnosis Date  . Coronary artery disease   . Hypertension     family history includes Cancer in his sister; Heart disease in his mother.   ROS: Negative except as per HPI  BP 160/100 mmHg  Pulse 53  Ht 5\' 6"  (1.676 m)  Wt 183 lb (83.008 kg)  BMI 29.55 kg/m2  PHYSICAL EXAM: Pt is alert and oriented, NAD HEENT: normal Neck: JVP - normal, carotids 2+= without bruits Lungs: CTA bilaterally CV: RRR without murmur or gallop Abd: soft, NT, Positive BS, no hepatomegaly Ext: no C/C/E, distal pulses intact and equal Skin: warm/dry no rash  EKG:  Sinus bradycardia 53 bpm, otherwise within normal limits.  ASSESSMENT AND PLAN: 1. Malignant hypertension, uncontrolled. Difficult to know how his blood pressure is running as he did not take his medicines this morning. He does not have a home cuff. Recommended that he obtain a home blood pressure cuff and check his blood pressure on a daily basis. He will continue on a combination of carvedilol and hydrochlorothiazide. He will call and if blood pressures are reading greater than 140/90. This has been a recurring theme and I stressed the importance of obtaining the blood pressure cuff and checking it regularly.  2. Coronary artery disease, native vessel, without  symptoms of angina. He will continue on aspirin, a beta blocker, and a statin drug.  3. Hypercholesterolemia. Cholesterol levels as above. The patient is on simvastatin and lipids are at goal.  Sherren Mocha, MD 01/04/2014 8:36 AM

## 2014-02-02 ENCOUNTER — Other Ambulatory Visit: Payer: Self-pay | Admitting: Cardiovascular Disease

## 2014-05-06 ENCOUNTER — Other Ambulatory Visit: Payer: Self-pay

## 2014-05-06 MED ORDER — CARVEDILOL 12.5 MG PO TABS: 12.5000 mg | ORAL_TABLET | Freq: Two times a day (BID) | ORAL | Status: DC

## 2014-05-06 MED ORDER — SIMVASTATIN 20 MG PO TABS: 20.0000 mg | ORAL_TABLET | Freq: Every day | ORAL | Status: DC

## 2014-05-06 MED ORDER — HYDROCHLOROTHIAZIDE 25 MG PO TABS
25.0000 mg | ORAL_TABLET | Freq: Every day | ORAL | Status: DC
Start: 2014-05-06 — End: 2014-05-06

## 2014-05-06 MED ORDER — HYDROCHLOROTHIAZIDE 25 MG PO TABS: 25.0000 mg | ORAL_TABLET | Freq: Every day | ORAL | Status: DC

## 2014-05-31 ENCOUNTER — Ambulatory Visit (INDEPENDENT_AMBULATORY_CARE_PROVIDER_SITE_OTHER): Payer: Commercial Managed Care - HMO | Admitting: Family Medicine

## 2014-05-31 VITALS — BP 156/90 | HR 59 | Temp 97.3°F | Resp 18 | Ht 67.0 in | Wt 182.0 lb

## 2014-05-31 DIAGNOSIS — I2583 Coronary atherosclerosis due to lipid rich plaque: Secondary | ICD-10-CM

## 2014-05-31 DIAGNOSIS — Z125 Encounter for screening for malignant neoplasm of prostate: Secondary | ICD-10-CM | POA: Diagnosis not present

## 2014-05-31 DIAGNOSIS — I10 Essential (primary) hypertension: Secondary | ICD-10-CM | POA: Diagnosis not present

## 2014-05-31 DIAGNOSIS — Z0000 Encounter for general adult medical examination without abnormal findings: Secondary | ICD-10-CM

## 2014-05-31 DIAGNOSIS — I2510 Atherosclerotic heart disease of native coronary artery without angina pectoris: Secondary | ICD-10-CM

## 2014-05-31 LAB — POCT CBC
Granulocyte percent: 64.3 %G (ref 37–80)
HCT, POC: 49.7 % (ref 43.5–53.7)
Hemoglobin: 16.4 g/dL (ref 14.1–18.1)
Lymph, poc: 2.2 (ref 0.6–3.4)
MCH, POC: 30.4 pg (ref 27–31.2)
MCHC: 33 g/dL (ref 31.8–35.4)
MCV: 92.2 fL (ref 80–97)
MID (cbc): 0.5 (ref 0–0.9)
MPV: 7.9 fL (ref 0–99.8)
POC Granulocyte: 4.9 (ref 2–6.9)
POC LYMPH PERCENT: 28.6 %L (ref 10–50)
POC MID %: 7.1 %M (ref 0–12)
Platelet Count, POC: 191 10*3/uL (ref 142–424)
RBC: 5.39 M/uL (ref 4.69–6.13)
RDW, POC: 15.6 %
WBC: 7.6 10*3/uL (ref 4.6–10.2)

## 2014-05-31 LAB — COMPREHENSIVE METABOLIC PANEL
ALT: 20 U/L (ref 0–53)
AST: 17 U/L (ref 0–37)
Albumin: 4 g/dL (ref 3.5–5.2)
Alkaline Phosphatase: 61 U/L (ref 39–117)
BUN: 14 mg/dL (ref 6–23)
CO2: 28 mEq/L (ref 19–32)
Calcium: 9.4 mg/dL (ref 8.4–10.5)
Chloride: 102 mEq/L (ref 96–112)
Creat: 0.85 mg/dL (ref 0.50–1.35)
Glucose, Bld: 97 mg/dL (ref 70–99)
Potassium: 4 mEq/L (ref 3.5–5.3)
Sodium: 138 mEq/L (ref 135–145)
Total Bilirubin: 0.9 mg/dL (ref 0.2–1.2)
Total Protein: 7.2 g/dL (ref 6.0–8.3)

## 2014-05-31 LAB — POCT URINALYSIS DIPSTICK
Blood, UA: NEGATIVE
Glucose, UA: NEGATIVE
Ketones, UA: NEGATIVE
Leukocytes, UA: NEGATIVE
Nitrite, UA: NEGATIVE
Spec Grav, UA: 1.02
Urobilinogen, UA: 0.2
pH, UA: 6.5

## 2014-05-31 LAB — LIPID PANEL
Cholesterol: 142 mg/dL (ref 0–200)
HDL: 45 mg/dL (ref >=40)
LDL Cholesterol: 75 mg/dL (ref 0–99)
Total CHOL/HDL Ratio: 3.2 Ratio
Triglycerides: 111 mg/dL (ref <150)
VLDL: 22 mg/dL (ref 0–40)

## 2014-05-31 LAB — IFOBT (OCCULT BLOOD): IFOBT: NEGATIVE

## 2014-05-31 LAB — POCT GLYCOSYLATED HEMOGLOBIN (HGB A1C): Hemoglobin A1C: 5.4

## 2014-05-31 MED ORDER — CARVEDILOL 12.5 MG PO TABS: 12.5000 mg | ORAL_TABLET | Freq: Two times a day (BID) | ORAL | Status: DC

## 2014-05-31 MED ORDER — HYDROCHLOROTHIAZIDE 25 MG PO TABS: 25.0000 mg | ORAL_TABLET | Freq: Every day | ORAL | Status: DC

## 2014-05-31 MED ORDER — SIMVASTATIN 20 MG PO TABS: 20.0000 mg | ORAL_TABLET | Freq: Every day | ORAL | Status: DC

## 2014-05-31 NOTE — Patient Instructions (Signed)

## 2014-05-31 NOTE — Progress Notes (Signed)
Patient ID: [name], male   DOB: [birth year], 74 y.o.   MRN: [id number]  This chart was scribed for Robyn Haber, MD by Ladene Artist, ED Scribe. The patient was seen in room 11. Patient's care was started at 11:02 AM.  Patient ID: [name] MRN: [id number], DOB: 06-25-1940, 74 y.o. Date of Encounter: 05/31/2014, 11:02 AM  Primary Physician: No primary care provider on file.  Chief Complaint  Patient presents with  . Otalgia    few days now   . Hyperlipidemia    lipids    HPI: 74 y.o. year old male with history below presents for a physical exam regarding hyperlipidemia. Pt states that he has taken all prescriptions as prescribed. He denies urinary symptoms, leg swelling, sleep disturbances and any other symptoms at this time.  Patient had colonoscopy approximately 5 years ago  Ears Pt reports that he has been using vinegar in his ears which have prevented cerumen impactions. He denies ear pain and hearing loss at this time.   Cardiologist  Pt is followed by cardiologist Sherren Mocha, MD for Coronary Artery Disease. His last visit was 01/04/14.   Exercise  Pt has a step machine at home that he uses in the winter. He reports walking for exercising during the warmer months.   Pt has been with his wife for approximately 50 years.   Past Medical History  Diagnosis Date  . Coronary artery disease   . Hypertension     Home Meds: Prior to Admission medications   Medication Sig Start Date End Date Taking? Authorizing Provider  aspirin 81 MG chewable tablet Chew 81 mg by mouth daily.    Yes Historical Provider, MD  carvedilol (COREG) 12.5 MG tablet Take 1 tablet (12.5 mg total) by mouth 2 (two) times daily. 05/06/14  Yes Sherren Mocha, MD  hydrochlorothiazide (HYDRODIURIL) 25 MG tablet Take 1 tablet (25 mg total) by mouth daily. 05/06/14  Yes Sherren Mocha, MD  simvastatin (ZOCOR) 20 MG tablet Take 1 tablet (20 mg total) by mouth at bedtime. 05/06/14  Yes Sherren Mocha, MD   Allergies:  Allergies  Allergen Reactions  . Lipitor [Atorvastatin] Shortness Of Breath  . Pork-Derived Products     No pork products    History   Social History  . Marital Status: Married    Spouse Name: N/A  . Number of Children: N/A  . Years of Education: N/A   Occupational History  . Not on file.   Social History Main Topics  . Smoking status: Never Smoker   . Smokeless tobacco: Not on file  . Alcohol Use: No  . Drug Use: No  . Sexual Activity: Not on file   Other Topics Concern  . Not on file   Social History Narrative   Lives in [place] with Wife and 2 sons.  From [place].  To [place] ~2000.     Currently retired but worked in [place]: Constitutional: negative for chills, fever, night sweats, weight changes, or fatigue  HEENT: negative for hearing loss, ear pain, vision changes, congestion, rhinorrhea, ST, epistaxis, or sinus pressure Cardiovascular: negative for leg swelling, chest pain or palpitations Respiratory: negative for hemoptysis, wheezing, shortness of breath, or cough Abdominal: negative for abdominal pain, nausea, vomiting, diarrhea, or constipation GU: negative for urinary symptoms  Dermatological: negative for rash Neurologic: negative for sleep disturbances, headache, dizziness, or syncope All other systems reviewed and are otherwise negative with the exception to those  above and in the HPI.  Physical Exam: Blood pressure 156/90, pulse 59, temperature 97.3 F (36.3 C), temperature source Oral, resp. rate 18, height 5\' 7"  (1.702 m), weight 182 lb (82.555 kg), SpO2 98 %., Body mass index is 28.5 kg/(m^2). General: Well developed, well nourished, in no acute distress. Head: Normocephalic, atraumatic, eyes without discharge, sclera non-icteric, nares are without discharge. Bilateral auditory canals clear, TM's are without perforation, pearly grey and translucent with reflective cone of light  bilaterally. Oral cavity moist, posterior pharynx without exudate, erythema, peritonsillar abscess, or post nasal drip.  Neck: Supple. No thyromegaly. Full ROM. No lymphadenopathy. Lungs: Clear bilaterally to auscultation without wheezes, rales, or rhonchi. Breathing is unlabored. Heart: RRR with S1 S2. No murmurs, rubs, or gallops appreciated. Abdomen: Soft, non-tender, non-distended with normoactive bowel sounds. No hepatomegaly. No rebound/guarding. No obvious abdominal masses. Msk:  Strength and tone normal for age. GU: Normal prostate. Normal rectal exam. Extremities/Skin: Warm and dry. No clubbing or cyanosis. No edema. No rashes or suspicious lesions. Neuro: Alert and oriented X 3. Moves all extremities spontaneously. Gait is normal. CNII-XII grossly in tact. Psych:  Responds to questions appropriately with a normal affect.   Labs:  Results for orders placed or performed in visit on 05/31/14  POCT CBC  Result Value Ref Range   WBC 7.6 4.6 - 10.2 K/uL   Lymph, poc 2.2 0.6 - 3.4   POC LYMPH PERCENT 28.6 10 - 50 %L   MID (cbc) 0.5 0 - 0.9   POC MID % 7.1 0 - 12 %M   POC Granulocyte 4.9 2 - 6.9   Granulocyte percent 64.3 37 - 80 %G   RBC 5.39 4.69 - 6.13 M/uL   Hemoglobin 16.4 14.1 - 18.1 g/dL   HCT, POC 49.7 43.5 - 53.7 %   MCV 92.2 80 - 97 fL   MCH, POC 30.4 27 - 31.2 pg   MCHC 33.0 31.8 - 35.4 g/dL   RDW, POC 15.6 %   Platelet Count, POC 191 142 - 424 K/uL   MPV 7.9 0 - 99.8 fL  POCT glycosylated hemoglobin (Hb A1C)  Result Value Ref Range   Hemoglobin A1C 5.4   POCT urinalysis dipstick  Result Value Ref Range   Color, UA yellow    Clarity, UA clear    Glucose, UA neg    Bilirubin, UA small    Ketones, UA neg    Spec Grav, UA 1.020    Blood, UA neg    pH, UA 6.5    Protein, UA trace    Urobilinogen, UA 0.2    Nitrite, UA neg    Leukocytes, UA Negative   IFOBT POC (occult bld, rslt in office)  Result Value Ref Range   IFOBT Negative      ASSESSMENT AND PLAN:   74 y.o. year old male with  1. Essential hypertension   2. Coronary arteriosclerosis due to lipid rich plaque    This chart was scribed in my presence and reviewed by me personally.    ICD-9-CM ICD-10-CM   1. Annual physical exam V70.0 Z00.00 POCT CBC     POCT glycosylated hemoglobin (Hb A1C)     Comprehensive metabolic panel     PSA     Lipid panel     POCT urinalysis dipstick     IFOBT POC (occult bld, rslt in office)  2. Essential hypertension 401.9 I10 hydrochlorothiazide (HYDRODIURIL) 25 MG tablet     carvedilol (COREG) 12.5 MG tablet  POCT CBC     POCT glycosylated hemoglobin (Hb A1C)     Comprehensive metabolic panel     POCT urinalysis dipstick  3. Coronary arteriosclerosis due to lipid rich plaque 414.3 I25.83 simvastatin (ZOCOR) 20 MG tablet     Lipid panel     Signed, Robyn Haber, MD   Signed, Robyn Haber, MD 05/31/2014 11:02 AM

## 2014-06-01 LAB — PSA: PSA: 1.46 ng/mL (ref <=4.00)

## 2014-07-14 ENCOUNTER — Other Ambulatory Visit (INDEPENDENT_AMBULATORY_CARE_PROVIDER_SITE_OTHER): Payer: Commercial Managed Care - HMO | Admitting: *Deleted

## 2014-07-14 DIAGNOSIS — I10 Essential (primary) hypertension: Secondary | ICD-10-CM | POA: Diagnosis not present

## 2014-07-14 DIAGNOSIS — E785 Hyperlipidemia, unspecified: Secondary | ICD-10-CM | POA: Diagnosis not present

## 2014-07-14 LAB — BASIC METABOLIC PANEL
BUN: 14 mg/dL (ref 6–23)
CO2: 32 meq/L (ref 19–32)
CREATININE: 0.94 mg/dL (ref 0.40–1.50)
Calcium: 9.3 mg/dL (ref 8.4–10.5)
Chloride: 102 mEq/L (ref 96–112)
GFR: 83.8 mL/min (ref >60.00)
GLUCOSE: 109 mg/dL — AB (ref 70–99)
Potassium: 3.6 mEq/L (ref 3.5–5.1)
Sodium: 137 mEq/L (ref 135–145)

## 2014-07-14 LAB — HEPATIC FUNCTION PANEL
ALBUMIN: 3.8 g/dL (ref 3.5–5.2)
ALT: 21 U/L (ref 0–53)
AST: 18 U/L (ref 0–37)
Alkaline Phosphatase: 54 U/L (ref 39–117)
BILIRUBIN DIRECT: 0.1 mg/dL (ref 0.0–0.3)
BILIRUBIN TOTAL: 0.6 mg/dL (ref 0.2–1.2)
TOTAL PROTEIN: 6.9 g/dL (ref 6.0–8.3)

## 2014-07-14 LAB — LIPID PANEL
Cholesterol: 125 mg/dL (ref 0–200)
HDL: 44.9 mg/dL (ref >39.00)
LDL Cholesterol: 66 mg/dL (ref 0–99)
NonHDL: 80.1
Total CHOL/HDL Ratio: 3
Triglycerides: 73 mg/dL (ref 0.0–149.0)
VLDL: 14.6 mg/dL (ref 0.0–40.0)

## 2014-07-21 ENCOUNTER — Ambulatory Visit (INDEPENDENT_AMBULATORY_CARE_PROVIDER_SITE_OTHER): Payer: Commercial Managed Care - HMO | Admitting: Cardiovascular Disease

## 2014-07-21 ENCOUNTER — Encounter: Payer: Self-pay | Admitting: Cardiovascular Disease

## 2014-07-21 VITALS — BP 150/90 | HR 56 | Ht 67.0 in | Wt 184.8 lb

## 2014-07-21 DIAGNOSIS — I2510 Atherosclerotic heart disease of native coronary artery without angina pectoris: Secondary | ICD-10-CM | POA: Diagnosis not present

## 2014-07-21 NOTE — Patient Instructions (Signed)
Medication Instructions: - no changes  Labwork: - Your physician recommends that you return for FASTING lab work in: 1 year- CMET/ CBC/ Lipid (just prior to follow up with Dr. Burt Knack)  Procedures/Testing: - none  Follow-Up: - Your physician wants you to follow-up in: 1 year with Dr. Burt Knack. You will receive a reminder letter in the mail two months in advance. If you don't receive a letter, please call our office to schedule the follow-up appointment.  Any Additional Special Instructions Will Be Listed Below (If Applicable). - none

## 2014-07-22 ENCOUNTER — Encounter: Payer: Self-pay | Admitting: Cardiovascular Disease

## 2014-07-22 NOTE — Progress Notes (Signed)
Cardiology Office Note   Date:  07/22/2014   ID:  [name], DOB 03/25/1940, MRN [id number]  PCP:  Robyn Haber, MD  Cardiologist:  Sherren Mocha, MD    No chief complaint on file.    History of Present Illness: [name] is a 74 y.o. male who presents for follow-up of coronary artery disease and hypertension. He was last seen in November 2015. He initially presented with unstable angina in 2008 and underwent PCI of the proximal left circumflex and first obtuse marginal branches with bare-metal stents. He was noted to have diffuse LAD stenosis and moderate stenosis of the right PDA. Medical therapy was recommended for his residual coronary artery disease. The patient also has a history of malignant hypertension and he was hospitalized in 2011 with hypertensive urgency.  He is doing very well at present. He has no complaints today. He specifically denies chest pain, shortness of breath, orthopnea, PND, or leg swelling. He has been compliant with his medications. Home blood pressures have been very well controlled.   Past Medical History  Diagnosis Date  . Coronary artery disease   . Hypertension     Past Surgical History  Procedure Laterality Date  . Coronary stent placement      Current Outpatient Prescriptions  Medication Sig Dispense Refill  . aspirin 81 MG chewable tablet Chew 81 mg by mouth daily.     . carvedilol (COREG) 12.5 MG tablet Take 1 tablet (12.5 mg total) by mouth 2 (two) times daily. 180 tablet 3  . hydrochlorothiazide (HYDRODIURIL) 25 MG tablet Take 1 tablet (25 mg total) by mouth daily. 90 tablet 3  . simvastatin (ZOCOR) 20 MG tablet Take 1 tablet (20 mg total) by mouth at bedtime. 90 tablet 3   No current facility-administered medications for this visit.    Allergies:   Lipitor and Pork-derived products   Social History:  The patient  reports that he has never smoked. He does not have any smokeless tobacco history on file. He reports  that he does not drink alcohol or use illicit drugs.   Family History:  The patient's  family history includes Cancer in his sister; Heart disease in his mother.    ROS:  Please see the history of present illness.  Otherwise, review of systems is positive for snoring and back pain.  All other systems are reviewed and negative.    PHYSICAL EXAM: VS:  BP 150/90 mmHg  Pulse 56  Ht 5\' 7"  (1.702 m)  Wt 184 lb 12.8 oz (83.825 kg)  BMI 28.94 kg/m2 , BMI Body mass index is 28.94 kg/(m^2). GEN: Well nourished, well developed, in no acute distress HEENT: normal Neck: no JVD, no masses. No carotid bruits Cardiac: RRR without murmur or gallop                Respiratory:  clear to auscultation bilaterally, normal work of breathing GI: soft, nontender, nondistended, + BS MS: no deformity or atrophy Ext: no pretibial edema, pedal pulses 2+= bilaterally Skin: warm and dry, no rash Neuro:  Strength and sensation are intact Psych: euthymic mood, full affect  EKG:  EKG is ordered today. The ekg ordered today shows sinus bradycardia 56 bpm, otherwise within normal limits.  Recent Labs: 05/31/2014: Hemoglobin 16.4 07/14/2014: ALT 21; BUN 14; Creatinine 0.94; Potassium 3.6; Sodium 137   Lipid Panel     Component Value Date/Time   CHOL 125 07/14/2014 0747   TRIG 73.0 07/14/2014 0747   HDL 44.90 07/14/2014 0747  CHOLHDL 3 07/14/2014 0747   VLDL 14.6 07/14/2014 0747   LDLCALC 66 07/14/2014 0747   LDLDIRECT 150.8 06/30/2008 1007     Wt Readings from Last 3 Encounters:  07/21/14 184 lb 12.8 oz (83.825 kg)  05/31/14 182 lb (82.555 kg)  01/04/14 183 lb (83.008 kg)    Cardiac Studies Reviewed: 2-D echocardiogram 04/17/2012: Study Conclusions  - Left ventricle: The cavity size was normal. Wall thickness was increased in a pattern of moderate LVH. Systolic function was vigorous. The estimated ejection fraction was in the range of 65% to 70%. Wall motion was normal; there were no  regional wall motion abnormalities. Doppler parameters are consistent with abnormal left ventricular relaxation (grade 1 diastolic dysfunction). - Aortic valve: Trivial regurgitation. - Mitral valve: Calcified annulus. - Left atrium: The atrium was mildly dilated.  ASSESSMENT AND PLAN: 1.  Coronary artery disease, native vessel: The patient is stable without symptoms of angina. He continues on aspirin, beta blocker, and a statin drug. I will see him back in one year for follow-up evaluation.  2. Hyperlipidemia: Labs were reviewed and his lipids are at goal. He continues on simvastatin 20 mg daily. Diet and lifestyle modification were reviewed with the patient.  3. Essential hypertension: The patient's blood pressure is controlled based on home readings. He seems to have a component of whitecoat hypertension. Blood pressure is much better than it has been in the past. We'll continue carvedilol and hydrochlorothiazide. Home readings have been in the 120s over 70s.   Current medicines are reviewed with the patient today.  The patient does not have concerns regarding medicines.  Labs/ tests ordered today include:  Orders Placed This Encounter  Procedures  . Basic Metabolic Panel (BMET)  . CBC with Differential/Platelet  . Hepatic function panel  . Lipid Profile  . EKG 12-Lead    Disposition:   FU one year  Signed, Sherren Mocha, MD  07/22/2014 4:05 PM    [facility] [place], [place], [place]  [address] Phone: [phone]; Fax: [phone]

## 2014-12-09 ENCOUNTER — Ambulatory Visit (INDEPENDENT_AMBULATORY_CARE_PROVIDER_SITE_OTHER): Payer: Commercial Managed Care - HMO

## 2014-12-09 ENCOUNTER — Ambulatory Visit (INDEPENDENT_AMBULATORY_CARE_PROVIDER_SITE_OTHER): Payer: Commercial Managed Care - HMO | Admitting: Family Medicine

## 2014-12-09 VITALS — BP 130/78 | HR 78 | Temp 97.2°F | Resp 18 | Ht 67.0 in | Wt 181.8 lb

## 2014-12-09 DIAGNOSIS — R0789 Other chest pain: Secondary | ICD-10-CM

## 2014-12-09 DIAGNOSIS — R067 Sneezing: Secondary | ICD-10-CM | POA: Diagnosis not present

## 2014-12-09 MED ORDER — PREDNISONE 10 MG PO TABS: 20.0000 mg | ORAL_TABLET | Freq: Every day | ORAL | Status: DC

## 2014-12-09 NOTE — Progress Notes (Signed)
Subjective:  This chart was scribed for Gerald Haber MD, by Tamsen Roers, at [facility].  This patient was seen in room 4  and the patient's care was started at 3:38 PM.    Patient ID: [name], male    DOB: 1940/12/17, 74 y.o.   MRN: [id number]  Chief Complaint  Patient presents with   Dizziness    today    HPI  HPI Comments: [name] is a 74 y.o. male who presents to the [facility] complaining of dizziness and weakness onset today when he was mowing the lawn.  He states he has a different pain in the frontal area of his face. He denies any headache or feeling dizzy while sitting down currently.  According to his wife, he also felt cold after the incident.   He denies vomiting/ nausea. Patients cardiologist is Dr. Burt Knack who told him there were no concerns.   Back pain: Patient notes that he has "horrible" back pain if he coughs/sneezes or laughs.  He is not able to lay down quickly.  Per wife, patient sneezes very hard and thinks that is the cause of his pain.     Patient Active Problem List   Diagnosis Date Noted   pandiverticulosis 04/22/2012   Internal hemorrhoids 04/19/2012   Syncope and collapse 04/17/2012   Polycythemia vera(238.4) 04/17/2012   Chest pain 04/16/2012   Hypertensive urgency, malignant 04/16/2012   Hyperlipidemia 06/14/2008   Hypertensive heart disease 06/14/2008   Coronary Artery Disease s/p BMS to CFX and OM1 in 2008 06/14/2008   Past Medical History  Diagnosis Date   Coronary artery disease    Hypertension    Past Surgical History  Procedure Laterality Date   Coronary stent placement     Allergies  Allergen Reactions   Lipitor [Atorvastatin] Shortness Of Breath   Pork-Derived Products Other (See Comments)    No pork products   Prior to Admission medications   Medication Sig Start Date End Date Taking? Authorizing Provider  aspirin 81 MG chewable tablet Chew 81  mg by mouth daily.    Yes Historical Provider, MD  carvedilol (COREG) 12.5 MG tablet Take 1 tablet (12.5 mg total) by mouth 2 (two) times daily. 05/31/14  Yes Gerald Haber, MD  hydrochlorothiazide (HYDRODIURIL) 25 MG tablet Take 1 tablet (25 mg total) by mouth daily. 05/31/14  Yes Gerald Haber, MD  simvastatin (ZOCOR) 20 MG tablet Take 1 tablet (20 mg total) by mouth at bedtime. 05/31/14  Yes Gerald Haber, MD   Social History   Social History   Marital Status: Married    Spouse Name: N/A   Number of Children: N/A   Years of Education: N/A   Occupational History   Not on file.   Social History Main Topics   Smoking status: Never Smoker    Smokeless tobacco: Not on file   Alcohol Use: No   Drug Use: No   Sexual Activity: Not on file   Other Topics Concern   Not on file   Social History Narrative   Lives in [place] with Wife and 2 sons.  From [place].  To [place] ~2000.     Currently retired but worked in [place]  Constitutional: Negative for fever and chills.  HENT: Negative for sneezing.   Respiratory: Negative for cough and choking.   Cardiovascular: Negative for chest pain.  Gastrointestinal: Negative for nausea and vomiting.  Musculoskeletal:  Positive for back pain. Negative for neck pain and neck stiffness.  Neurological: Positive for dizziness and weakness. Negative for syncope, light-headedness and headaches.       Objective:   Physical Exam  Constitutional: He appears well-developed and well-nourished. No distress.  HENT:  Head: Normocephalic and atraumatic.  Eyes: Pupils are equal, round, and reactive to light.  Neck: Normal range of motion.  Cardiovascular:  1/6 systolic murmur.   Pulmonary/Chest: Effort normal and breath sounds normal. No respiratory distress. He has no wheezes. He has no rales.  Musculoskeletal:  He is tender inferior to the scapula.    Skin: Skin is warm and dry.    Filed Vitals:     12/09/14 1511  BP: 130/78  Pulse: 78  Temp: 97.2 F (36.2 C)  TempSrc: Oral  Resp: 18  Height: 5\' 7"  (1.702 m)  Weight: 181 lb 12.8 oz (82.464 kg)  SpO2: 98%    [facility] reading (PRIMARY) by  Dr. Joseph Art:  Negative chest.       Assessment & Plan:   This chart was scribed in my presence and reviewed by me personally.    ICD-9-CM ICD-10-CM   1. Other chest pain 786.59 R07.89 DG Chest 2 View     predniSONE (DELTASONE) 10 MG tablet     CANCELED: DG Chest 1 View  2. Sneezing 784.99 R06.7 predniSONE (DELTASONE) 10 MG tablet     Signed, Gerald Haber, MD

## 2015-08-04 ENCOUNTER — Other Ambulatory Visit: Payer: Self-pay | Admitting: Family Medicine

## 2015-08-04 ENCOUNTER — Other Ambulatory Visit: Payer: Self-pay | Admitting: Cardiovascular Disease

## 2015-08-04 NOTE — Telephone Encounter (Signed)
Patient will be calling back not sure if taking coreg.  If she is, she was informed she will needed an office visit before this refill runs out.

## 2015-08-12 ENCOUNTER — Telehealth: Payer: Self-pay

## 2015-08-12 DIAGNOSIS — I2510 Atherosclerotic heart disease of native coronary artery without angina pectoris: Secondary | ICD-10-CM

## 2015-08-12 DIAGNOSIS — I10 Essential (primary) hypertension: Secondary | ICD-10-CM

## 2015-08-12 DIAGNOSIS — I2583 Coronary atherosclerosis due to lipid rich plaque: Principal | ICD-10-CM

## 2015-08-12 NOTE — Telephone Encounter (Addendum)
Pt is checking on  The status of his refill request from the pharmacy zocor or coreg    Best number  [phone]

## 2015-08-13 NOTE — Telephone Encounter (Signed)
Patient needs a refill on his Zocor and Coreg. Patient would like a 90 day supply and also for it to be sent to the [facility] mail order.

## 2015-08-14 MED ORDER — SIMVASTATIN 20 MG PO TABS: 20.0000 mg | ORAL_TABLET | Freq: Every day | ORAL | Status: DC

## 2015-08-14 MED ORDER — CARVEDILOL 12.5 MG PO TABS: 12.5000 mg | ORAL_TABLET | Freq: Two times a day (BID) | ORAL | Status: DC

## 2015-10-10 ENCOUNTER — Other Ambulatory Visit: Payer: Self-pay | Admitting: Cardiovascular Disease

## 2016-01-20 ENCOUNTER — Encounter: Payer: Self-pay | Admitting: Cardiology

## 2016-01-20 ENCOUNTER — Ambulatory Visit (INDEPENDENT_AMBULATORY_CARE_PROVIDER_SITE_OTHER): Payer: Commercial Managed Care - HMO | Admitting: Cardiology

## 2016-01-20 DIAGNOSIS — I2583 Coronary atherosclerosis due to lipid rich plaque: Secondary | ICD-10-CM

## 2016-01-20 DIAGNOSIS — I2510 Atherosclerotic heart disease of native coronary artery without angina pectoris: Secondary | ICD-10-CM

## 2016-01-20 DIAGNOSIS — I10 Essential (primary) hypertension: Secondary | ICD-10-CM | POA: Diagnosis not present

## 2016-01-20 LAB — COMPREHENSIVE METABOLIC PANEL
ALT: 17 U/L (ref 9–46)
AST: 17 U/L (ref 10–35)
Albumin: 3.9 g/dL (ref 3.6–5.1)
Alkaline Phosphatase: 49 U/L (ref 40–115)
BUN: 17 mg/dL (ref 7–25)
CHLORIDE: 101 mmol/L (ref 98–110)
CO2: 28 mmol/L (ref 20–31)
CREATININE: 0.95 mg/dL (ref 0.70–1.18)
Calcium: 9.3 mg/dL (ref 8.6–10.3)
GLUCOSE: 108 mg/dL — AB (ref 65–99)
POTASSIUM: 4.1 mmol/L (ref 3.5–5.3)
SODIUM: 138 mmol/L (ref 135–146)
Total Bilirubin: 0.9 mg/dL (ref 0.2–1.2)
Total Protein: 6.8 g/dL (ref 6.1–8.1)

## 2016-01-20 LAB — HEPATIC FUNCTION PANEL
ALT: 17 U/L (ref 9–46)
AST: 17 U/L (ref 10–35)
Albumin: 3.9 g/dL (ref 3.6–5.1)
Alkaline Phosphatase: 49 U/L (ref 40–115)
BILIRUBIN DIRECT: 0.2 mg/dL (ref <=0.2)
BILIRUBIN INDIRECT: 0.7 mg/dL (ref 0.2–1.2)
BILIRUBIN TOTAL: 0.9 mg/dL (ref 0.2–1.2)
Total Protein: 6.8 g/dL (ref 6.1–8.1)

## 2016-01-20 LAB — LIPID PANEL
CHOL/HDL RATIO: 2.9 ratio (ref <5.0)
Cholesterol: 134 mg/dL (ref <200)
HDL: 46 mg/dL (ref >40)
LDL CALC: 74 mg/dL (ref <100)
Triglycerides: 72 mg/dL (ref <150)
VLDL: 14 mg/dL (ref <30)

## 2016-01-20 MED ORDER — LOSARTAN POTASSIUM 25 MG PO TABS: 25.0000 mg | ORAL_TABLET | Freq: Every day | ORAL | 0 refills | Status: DC

## 2016-01-20 MED ORDER — LOSARTAN POTASSIUM 25 MG PO TABS: 25.0000 mg | ORAL_TABLET | Freq: Every day | ORAL | 3 refills | Status: DC

## 2016-01-20 MED ORDER — HYDROCHLOROTHIAZIDE 25 MG PO TABS: 25.0000 mg | ORAL_TABLET | Freq: Every day | ORAL | 3 refills | Status: DC

## 2016-01-20 MED ORDER — SIMVASTATIN 20 MG PO TABS: 20.0000 mg | ORAL_TABLET | Freq: Every day | ORAL | 3 refills | Status: DC

## 2016-01-20 MED ORDER — CARVEDILOL 12.5 MG PO TABS: 12.5000 mg | ORAL_TABLET | Freq: Two times a day (BID) | ORAL | 3 refills | Status: DC

## 2016-01-20 NOTE — Patient Instructions (Signed)
Medication Instructions:  Your physician has recommended you make the following change in your medication:  1) START Losartan 25 mg by mouth ONCE daily    Labwork: Your physician recommends that you return for lab work in: TODAY (CMET, Lipid/Liver) The lab can be found on the [address] of out building in [place] recommends that you return for lab work in: 1 week (bmet) The lab can be found on the [address] of out building in [place]   Testing/Procedures: none  Follow-Up: Your physician recommends that you schedule a follow-up appointment in: 1 week in Hypertension Clinic  Your physician recommends that you schedule a follow-up appointment in: March 2018 with Dr. Burt Knack   Any Other Special Instructions Will Be Listed Below (If Applicable).     If you need a refill on your cardiac medications before your next appointment, please call your pharmacy.

## 2016-01-20 NOTE — Progress Notes (Signed)
01/20/2016 [name]   [birth year]  AP:[id number]  Primary Physician Robyn Haber, MD Primary Cardiologist: Dr.Cooper   Reason for Visit/CC: High Blood Pressure; F/U for CAD  HPI: The patient  is a 75 year old male, followed by Dr. Burt Knack with a history of coronary artery disease and hypertension. Is been well over a year since his last office visit. He was seen by Dr. Burt Knack in June 2016.  He initially presented with unstable angina in 2008 and underwent PCI of the proximal left circumflex and first obtuse marginal branches with bare-metal stents. He was noted to have diffuse LAD stenosis and moderate stenosis of the right PDA. Medical therapy was recommended for his residual coronary artery disease. The patient also has a history of malignant hypertension and he was hospitalized in 2011 with hypertensive urgency.  He has not had any recurrent anginal symptomatology. He denies exertional chest pain or dyspnea. No left arm pains/numbness which was his anginal equivalent. His EKG shows normal sinus rhythm without any ischemic abnormalities. Heart rate is 63 bpm. His main complaint and reason for today's office visit is over concerns of elevated blood pressure readings at home. He has noticed recent spikes in his blood pressure up into the [id number] systolic and [id number] to 0000000 diastolic. He reports full medication compliance. He is on 12.5 mg of carvedilol twice a day. He is also on hydrochlorothiazide 25 mg daily. We reviewed dietary triggers. He reports that he avoids salt. He also does not drink caffeine. He is not a smoker.  His last BMP was a year ago which showed normal function and potassium levels. He is fasting today and is overdue for a lipid panel. He is on simvastatin   Current Meds  Medication Sig  . aspirin 81 MG chewable tablet Chew 81 mg by mouth daily as needed (pain).   . carvedilol (COREG) 12.5 MG tablet Take 1 tablet (12.5 mg total) by mouth 2 (two) times daily.  .  hydrochlorothiazide (HYDRODIURIL) 25 MG tablet Take 1 tablet (25 mg total) by mouth daily. Patient is overdue for an appointment. Please call and schedule for further refills  . predniSONE (DELTASONE) 10 MG tablet Take 2 tablets (20 mg total) by mouth daily with breakfast.  . simvastatin (ZOCOR) 20 MG tablet Take 1 tablet (20 mg total) by mouth at bedtime.   Allergies  Allergen Reactions  . Lipitor [Atorvastatin] Shortness Of Breath  . Pork-Derived Products Other (See Comments)    No pork products - unspecified   Past Medical History:  Diagnosis Date  . Coronary artery disease   . Hypertension    Family History  Problem Relation Age of Onset  . Heart disease Mother   . Cancer Sister    Past Surgical History:  Procedure Laterality Date  . CORONARY STENT PLACEMENT     Social History   Social History  . Marital status: Married    Spouse name: N/A  . Number of children: N/A  . Years of education: N/A   Occupational History  . Not on file.   Social History Main Topics  . Smoking status: Never Smoker  . Smokeless tobacco: Not on file  . Alcohol use No  . Drug use: No  . Sexual activity: Not on file   Other Topics Concern  . Not on file   Social History Narrative   Lives in [place] with Wife and 2 sons.  From [place].  To [place] ~2000.     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  Cardiovascular: negative for chest pain, dyspnea on exertion, edema, orthopnea, palpitations, paroxysmal nocturnal dyspnea or shortness of breath Dermatological: negative for rash Respiratory: negative for cough or wheezing Urologic: negative for hematuria Abdominal: negative for nausea, vomiting, diarrhea, bright red blood per rectum, melena, or hematemesis Neurologic: negative for visual changes, syncope, or dizziness All other systems reviewed and are otherwise negative except as  noted above.   Physical Exam:  Blood pressure (!) 152/84, pulse 63, height 5\' 6"  (1.676 m), weight 182 lb 3.2 oz (82.6 kg).  General appearance: alert, cooperative and no distress Neck: no carotid bruit and no JVD Lungs: clear to auscultation bilaterally Heart: regular rate and rhythm, S1, S2 normal, no murmur, click, rub or gallop Extremities: extremities normal, atraumatic, no cyanosis or edema Pulses: 2+ and symmetric Skin: Skin color, texture, turgor normal. No rashes or lesions Neurologic: Grossly normal  EKG NSR. No ischemia 63 bpm   ASSESSMENT AND PLAN:   1. CAD: s/p PCI of the proximal left circumflex and first obtuse marginal branches with bare-metal stents in 2008. He was noted to have diffuse LAD stenosis and moderate stenosis of the right PDA. Medical therapy was recommended for his residual coronary artery disease. He denies exertional chest pain or dyspnea. No left arm pains/numbness which was his anginal equivalent. His EKG shows normal sinus rhythm without any ischemic abnormalities. Heart rate is 63 bpm. Continue medical therapy with ASA, BB and statin. We will add Losartan, 25 mg daily for better BP contol.  2. HTN: poorly controlled despite full compliance with Coreg and HCTZ. He is on the max dose of HCTZ and his low resting HR limits further titration of his Coreg. We will add Losartan 25 mg daily. Check a CMP today for baseline renal function. Repeat BMP in 1 week to reassess SCr and K. F/u in HTN clinic with pharmacist in 1 week for repeat BP check.  3. HLD: on statin therapy with simvastatin. He is fasting today. Will check FLP and CMP to assess hepatic enzymes.   PLAN  F/u with Dr. Burt Knack in 3 months.   Sarena Jezek PA-C 01/20/2016 10:05 AM

## 2016-01-24 ENCOUNTER — Telehealth: Payer: Self-pay | Admitting: Cardiology

## 2016-01-24 NOTE — Telephone Encounter (Signed)
Called patient to schedule 1 week BP follow up per Ellen Henri and patient refused to schedule. Stated he has too many doctors and does not want to come in. He  Needs appt in March with Dr. Burt Knack.

## 2016-02-28 ENCOUNTER — Telehealth: Payer: Self-pay | Admitting: Cardiovascular Disease

## 2016-02-28 NOTE — Telephone Encounter (Signed)
Pt calling to make a March appt-would like afternoons-pls call home number

## 2016-02-28 NOTE — Telephone Encounter (Signed)
Pt had been placed on wait list for appointment in March with Dr Burt Knack until phone call 02/28/16. See telephone note.

## 2016-02-28 NOTE — Telephone Encounter (Signed)
I spoke with the pt's wife and she is concerned about the pt and would like to schedule an appointment for the pt to see Dr Burt Knack.  She said the pt is having issues with his BP being elevated and complains of dizziness.  I have scheduled the pt to see Dr Burt Knack tomorrow.

## 2016-02-29 ENCOUNTER — Ambulatory Visit (INDEPENDENT_AMBULATORY_CARE_PROVIDER_SITE_OTHER): Payer: Commercial Managed Care - HMO | Admitting: Cardiovascular Disease

## 2016-02-29 ENCOUNTER — Encounter (INDEPENDENT_AMBULATORY_CARE_PROVIDER_SITE_OTHER): Payer: Self-pay

## 2016-02-29 ENCOUNTER — Encounter: Payer: Self-pay | Admitting: Cardiovascular Disease

## 2016-02-29 VITALS — BP 170/92 | HR 64 | Ht 66.0 in | Wt 184.8 lb

## 2016-02-29 DIAGNOSIS — I10 Essential (primary) hypertension: Secondary | ICD-10-CM

## 2016-02-29 DIAGNOSIS — R42 Dizziness and giddiness: Secondary | ICD-10-CM | POA: Diagnosis not present

## 2016-02-29 MED ORDER — VALSARTAN 160 MG PO TABS: 160.0000 mg | ORAL_TABLET | Freq: Every day | ORAL | 0 refills | Status: DC

## 2016-02-29 MED ORDER — VALSARTAN 160 MG PO TABS: 160.0000 mg | ORAL_TABLET | Freq: Every day | ORAL | 3 refills | Status: DC

## 2016-02-29 NOTE — Progress Notes (Signed)
Cardiology Office Note Date:  02/29/2016   ID:  [name], DOB 1940-04-03, MRN [id number]  PCP:  No PCP Per Patient  Cardiologist:  Sherren Mocha, MD    Chief Complaint  Patient presents with  . Dizziness    also having BP issues     History of Present Illness: [name] is a 76 y.o. male who presents for Follow-up of uncontrolled hypertension. The patient is here with his wife today. He has a history of coronary artery disease. He underwent PCI in 2008 when he initially presented with unstable angina. He has moderate diffuse coronary disease and medical therapy has been recommended for treatment of his residual CAD. He was hospitalized in 2011 with problems related to malignant hypertension and hypertensive urgency. The patient notes that his blood pressure has been elevated on a consistent basis lately. He denies blurry vision, chest pain, chest pressure, or shortness of breath. I reviewed recent notes and losartan was recommended that he never filled the prescription. He continues on hydrochlorothiazide and carvedilol.   Past Medical History:  Diagnosis Date  . Coronary artery disease   . Hypertension     Past Surgical History:  Procedure Laterality Date  . CORONARY STENT PLACEMENT      Current Outpatient Prescriptions  Medication Sig Dispense Refill  . aspirin 81 MG chewable tablet Chew 81 mg by mouth daily as needed (pain).     . carvedilol (COREG) 12.5 MG tablet Take 1 tablet (12.5 mg total) by mouth 2 (two) times daily. 180 tablet 3  . hydrochlorothiazide (HYDRODIURIL) 25 MG tablet Take 1 tablet (25 mg total) by mouth daily. 90 tablet 3  . losartan (COZAAR) 25 MG tablet Take 1 tablet (25 mg total) by mouth daily. 30 tablet 0  . predniSONE (DELTASONE) 10 MG tablet Take 2 tablets (20 mg total) by mouth daily with breakfast. 6 tablet 0  . simvastatin (ZOCOR) 20 MG tablet Take 1 tablet (20 mg total) by mouth at bedtime. 90 tablet 3   No current  facility-administered medications for this visit.     Allergies:   Lipitor [atorvastatin] and Pork-derived products   Social History:  The patient  reports that he has never smoked. He does not have any smokeless tobacco history on file. He reports that he does not drink alcohol or use drugs.   Family History:  The patient's  family history includes Cancer in his sister; Heart disease in his mother.    ROS:  Please see the history of present illness.  Otherwise, review of systems is positive for Back pain, sweating, snoring.  All other systems are reviewed and negative.    PHYSICAL EXAM: VS:  BP (!) 170/92   Pulse 64   Ht 5\' 6"  (1.676 m)   Wt 184 lb 12.8 oz (83.8 kg)   BMI 29.83 kg/m  , BMI Body mass index is 29.83 kg/m. GEN: Well nourished, well developed, in no acute distress  HEENT: normal  Neck: no JVD, no masses. No carotid bruits Cardiac: RRR without murmur or gallop                Respiratory:  clear to auscultation bilaterally, normal work of breathing GI: soft, nontender, nondistended, + BS MS: no deformity or atrophy  Ext: no pretibial edema, pedal pulses 2+= bilaterally Skin: warm and dry, no rash Neuro:  Strength and sensation are intact Psych: euthymic mood, full affect  EKG:  EKG is not ordered today.  Recent Labs: 01/20/2016: ALT  17; ALT 17; BUN 17; Creat 0.95; Potassium 4.1; Sodium 138   Lipid Panel     Component Value Date/Time   CHOL 134 01/20/2016 1047   TRIG 72 01/20/2016 1047   HDL 46 01/20/2016 1047   CHOLHDL 2.9 01/20/2016 1047   VLDL 14 01/20/2016 1047   LDLCALC 74 01/20/2016 1047   LDLDIRECT 150.8 06/30/2008 1007      Wt Readings from Last 3 Encounters:  02/29/16 184 lb 12.8 oz (83.8 kg)  01/20/16 182 lb 3.2 oz (82.6 kg)  12/09/14 181 lb 12.8 oz (82.5 kg)    ASSESSMENT AND PLAN: 1.  Hypertension, uncontrolled: The patient is following a low-sodium diet. He's compliant with his medications. He did not start losartan as instructed review  sleep. He will continue on carvedilol and hydrochlorothiazide at current doses. I advised him to start Diovan 160 mg daily. We'll follow-up labs prior to his return visit in the hypertension clinic. I asked him to record home blood pressure readings.  2. Coronary artery disease, native vessel, without symptoms of angina: Continue aspirin, beta blocker, and a statin drug.  3. Hyperlipidemia: Most recent lipids reviewed with an LDL cholesterol of 74, total cholesterol 134, and HDL 46.  Current medicines are reviewed with the patient today.  The patient does not have concerns regarding medicines.  Labs/ tests ordered today include:  No orders of the defined types were placed in this encounter.   Disposition:   FU HTN Clinic with Pharm D 3-4 weeks  Signed, Sherren Mocha, MD  02/29/2016 11:45 AM    [facility] [place], [place], [place]  [address] Phone: [phone]; Fax: [phone]

## 2016-02-29 NOTE — Patient Instructions (Addendum)
Medication Instructions:  Your physician has recommended you make the following change in your medication:  1. START Diovan 160mg  take one tablet by mouth daily  Labwork: Your physician recommends that you return for lab work in: 3-4 WEEKS (BMP)  Testing/Procedures: No new orders.   Follow-Up: You have been referred to Hypertension Clinic in 3-4 WEEKS for Blood Pressure management.  Your physician wants you to follow-up in: 6 MONTHS With Dr Burt Knack.  You will receive a reminder letter in the mail two months in advance. If you don't receive a letter, please call our office to schedule the follow-up appointment.   Any Other Special Instructions Will Be Listed Below (If Applicable).     If you need a refill on your cardiac medications before your next appointment, please call your pharmacy.

## 2016-03-28 ENCOUNTER — Ambulatory Visit (INDEPENDENT_AMBULATORY_CARE_PROVIDER_SITE_OTHER): Payer: Medicare HMO | Admitting: Pharmacist

## 2016-03-28 ENCOUNTER — Encounter (INDEPENDENT_AMBULATORY_CARE_PROVIDER_SITE_OTHER): Payer: Self-pay

## 2016-03-28 ENCOUNTER — Encounter: Payer: Self-pay | Admitting: Pharmacist

## 2016-03-28 ENCOUNTER — Other Ambulatory Visit: Payer: Medicare HMO | Admitting: *Deleted

## 2016-03-28 VITALS — BP 162/96 | HR 59

## 2016-03-28 DIAGNOSIS — R42 Dizziness and giddiness: Secondary | ICD-10-CM | POA: Diagnosis not present

## 2016-03-28 DIAGNOSIS — I10 Essential (primary) hypertension: Secondary | ICD-10-CM

## 2016-03-28 DIAGNOSIS — I160 Hypertensive urgency: Secondary | ICD-10-CM

## 2016-03-28 LAB — BASIC METABOLIC PANEL
BUN / CREAT RATIO: 15 (ref 10–24)
BUN: 14 mg/dL (ref 8–27)
CALCIUM: 9.5 mg/dL (ref 8.6–10.2)
CHLORIDE: 100 mmol/L (ref 96–106)
CO2: 27 mmol/L (ref 18–29)
Creatinine, Ser: 0.94 mg/dL (ref 0.76–1.27)
GFR calc non Af Amer: 80 mL/min/{1.73_m2} (ref >59)
GFR, EST AFRICAN AMERICAN: 93 mL/min/{1.73_m2} (ref >59)
GLUCOSE: 112 mg/dL — AB (ref 65–99)
POTASSIUM: 4.2 mmol/L (ref 3.5–5.2)
Sodium: 141 mmol/L (ref 134–144)

## 2016-03-28 NOTE — Progress Notes (Signed)
Patient ID: [name]                 DOB: 1940-07-03                      MRN: [id number]     HPI: [name] is a 76 y.o. male patient of Dr. Copper with PMH below who presents today for hypertension evaluation. At his most recent office visit about 1 month ago he was started on valsartan 160mg  daily.   He reports doing well since start valsartan. He states his pressures have been improved to high 140s/mid 80s since starting valsartan. He denies any dizziness. He states that he has noticed his pressure tends to runs slightly lower after being active (down to 130s/70-80s).    Cardiac Hx: CAD, s/p PCI in 2008, malignant hypertension  Current HTN meds:  Carvedilol 12.5mg  BID Valsartan 160mg  daily Hydrochlorothiazide 25mg  daily   Previously tried: None  BP goal: <130/80  Family History: Mother had HTN he believes. Both of his parents are deceased.   Social History: No tobacco or alcohol.   Diet: Follows low sodium diet. Most meals from home. Eats a lot of vegetables. Denies coffee and soda. Does drink natural teas occasionally.   Exercise: No formal exercise. Works in the yard in the summer.   Home BP readings: 140s/80s per patient report  Wt Readings from Last 3 Encounters:  02/29/16 184 lb 12.8 oz (83.8 kg)  01/20/16 182 lb 3.2 oz (82.6 kg)  12/09/14 181 lb 12.8 oz (82.5 kg)   BP Readings from Last 3 Encounters:  03/28/16 (!) 162/96  02/29/16 (!) 170/92  01/20/16 (!) 152/84   Pulse Readings from Last 3 Encounters:  03/28/16 (!) 59  02/29/16 64  01/20/16 63    Renal function: CrCl cannot be calculated (Patient's most recent lab result is older than the maximum 21 days allowed.).  Past Medical History:  Diagnosis Date  . Coronary artery disease   . Hypertension     Current Outpatient Prescriptions on File Prior to Visit  Medication Sig Dispense Refill  . aspirin 81 MG chewable tablet Chew 81 mg by mouth daily as needed (pain).     . carvedilol  (COREG) 12.5 MG tablet Take 1 tablet (12.5 mg total) by mouth 2 (two) times daily. 180 tablet 3  . hydrochlorothiazide (HYDRODIURIL) 25 MG tablet Take 1 tablet (25 mg total) by mouth daily. 90 tablet 3  . simvastatin (ZOCOR) 20 MG tablet Take 1 tablet (20 mg total) by mouth at bedtime. 90 tablet 3  . valsartan (DIOVAN) 160 MG tablet Take 1 tablet (160 mg total) by mouth daily. 15 tablet 0   No current facility-administered medications on file prior to visit.     Allergies  Allergen Reactions  . Lipitor [Atorvastatin] Shortness Of Breath  . Pork-Derived Products Other (See Comments)    No pork products - unspecified    Blood pressure (!) 162/96, pulse (!) 59, SpO2 97 %.   Assessment/Plan: Hypertension: BMET today. BP remains elevated though seems to have improved slightly at home. Will plan to increase valsartan to 320mg  daily pending lab results from today. Encouraged him to continue to monitor and bring log to next visit. Follow up for BP check and BMET in 4 weeks.   Update: BMET returned wnl. Patient notified and aware to proceed with dose increase to 320mg  daily. He will take 2 of his current supply until mail order arrives. Then he is  aware to take 1 tablet daily and continue to monitor until follow up appt.   Thank you, Lelan Pons. Patterson Hammersmith, [facility]  03/28/2016 9:13 AM

## 2016-03-28 NOTE — Patient Instructions (Signed)
Return for a follow up appointment in 4 weeks   Check your blood pressure at home daily (if able) and keep record of the readings.  Take your BP meds as follows: We will plan to increase valsartan to 320mg  (two of your current supply until you run out then 1 tablet of higher strength) if labs results look ok   Bring all of your meds, your BP cuff and your record of home blood pressures to your next appointment.  Exercise as you're able, try to walk approximately 30 minutes per day.  Keep salt intake to a minimum, especially watch canned and prepared boxed foods.  Eat more fresh fruits and vegetables and fewer canned items.  Avoid eating in fast food restaurants.    HOW TO TAKE YOUR BLOOD PRESSURE: . Rest 5 minutes before taking your blood pressure. .  Don't smoke or drink caffeinated beverages for at least 30 minutes before. . Take your blood pressure before (not after) you eat. . Sit comfortably with your back supported and both feet on the floor (don't cross your legs). . Elevate your arm to heart level on a table or a desk. . Use the proper sized cuff. It should fit smoothly and snugly around your bare upper arm. There should be enough room to slip a fingertip under the cuff. The bottom edge of the cuff should be 1 inch above the crease of the elbow. . Ideally, take 3 measurements at one sitting and record the average.

## 2016-03-29 MED ORDER — VALSARTAN 320 MG PO TABS: 320.0000 mg | ORAL_TABLET | Freq: Every day | ORAL | 1 refills | Status: DC

## 2016-04-06 ENCOUNTER — Emergency Department (HOSPITAL_COMMUNITY)
Admission: EM | Admit: 2016-04-06 | Discharge: 2016-04-07 | Disposition: A | Discharge status: Home / Self Care | Payer: Medicare HMO | Attending: Emergency Medicine | Admitting: Emergency Medicine

## 2016-04-06 ENCOUNTER — Emergency Department (HOSPITAL_COMMUNITY): Payer: Medicare HMO

## 2016-04-06 ENCOUNTER — Encounter (HOSPITAL_COMMUNITY): Payer: Self-pay | Admitting: Nurse Practitioner

## 2016-04-06 DIAGNOSIS — Z7982 Long term (current) use of aspirin: Secondary | ICD-10-CM | POA: Insufficient documentation

## 2016-04-06 DIAGNOSIS — J040 Acute laryngitis: Secondary | ICD-10-CM | POA: Diagnosis not present

## 2016-04-06 DIAGNOSIS — J069 Acute upper respiratory infection, unspecified: Secondary | ICD-10-CM | POA: Insufficient documentation

## 2016-04-06 DIAGNOSIS — Z79899 Other long term (current) drug therapy: Secondary | ICD-10-CM | POA: Insufficient documentation

## 2016-04-06 DIAGNOSIS — R55 Syncope and collapse: Secondary | ICD-10-CM | POA: Insufficient documentation

## 2016-04-06 DIAGNOSIS — Z955 Presence of coronary angioplasty implant and graft: Secondary | ICD-10-CM | POA: Diagnosis not present

## 2016-04-06 DIAGNOSIS — I10 Essential (primary) hypertension: Secondary | ICD-10-CM | POA: Insufficient documentation

## 2016-04-06 DIAGNOSIS — R079 Chest pain, unspecified: Secondary | ICD-10-CM | POA: Diagnosis not present

## 2016-04-06 DIAGNOSIS — R05 Cough: Secondary | ICD-10-CM | POA: Diagnosis not present

## 2016-04-06 DIAGNOSIS — I2510 Atherosclerotic heart disease of native coronary artery without angina pectoris: Secondary | ICD-10-CM | POA: Diagnosis not present

## 2016-04-06 LAB — CBC
HCT: 46.5 % (ref 39.0–52.0)
Hemoglobin: 16.4 g/dL (ref 13.0–17.0)
MCH: 32.2 pg (ref 26.0–34.0)
MCHC: 35.3 g/dL (ref 30.0–36.0)
MCV: 91.4 fL (ref 78.0–100.0)
PLATELETS: 174 10*3/uL (ref 150–400)
RBC: 5.09 MIL/uL (ref 4.22–5.81)
RDW: 13.4 % (ref 11.5–15.5)
WBC: 8.5 10*3/uL (ref 4.0–10.5)

## 2016-04-06 LAB — BASIC METABOLIC PANEL
Anion gap: 10 (ref 5–15)
BUN: 15 mg/dL (ref 6–20)
CHLORIDE: 100 mmol/L — AB (ref 101–111)
CO2: 27 mmol/L (ref 22–32)
CREATININE: 0.97 mg/dL (ref 0.61–1.24)
Calcium: 9.6 mg/dL (ref 8.9–10.3)
GFR calc Af Amer: >60 mL/min (ref >60)
GFR calc non Af Amer: >60 mL/min (ref >60)
Glucose, Bld: 112 mg/dL — ABNORMAL HIGH (ref 65–99)
Potassium: 3.8 mmol/L (ref 3.5–5.1)
SODIUM: 137 mmol/L (ref 135–145)

## 2016-04-06 LAB — I-STAT TROPONIN, ED
TROPONIN I, POC: 0 ng/mL (ref 0.00–0.08)
Troponin i, poc: 0 ng/mL (ref 0.00–0.08)

## 2016-04-06 MED ORDER — SODIUM CHLORIDE 0.9 % IV BOLUS (SEPSIS)
1000.0000 mL | Freq: Once | INTRAVENOUS | Status: AC
  Administered 2016-04-06: 1000 mL via INTRAVENOUS

## 2016-04-06 MED ORDER — HYDROCHLOROTHIAZIDE 12.5 MG PO CAPS
12.5000 mg | ORAL_CAPSULE | Freq: Every day | ORAL | Status: DC
  Administered 2016-04-06: 12.5 mg via ORAL
  Filled 2016-04-06: qty 1

## 2016-04-06 MED ORDER — BENZONATATE 100 MG PO CAPS: 100.0000 mg | ORAL_CAPSULE | Freq: Three times a day (TID) | ORAL | 0 refills | Status: AC

## 2016-04-06 MED ORDER — BENZONATATE 100 MG PO CAPS
100.0000 mg | ORAL_CAPSULE | Freq: Once | ORAL | Status: AC
  Administered 2016-04-06: 100 mg via ORAL
  Filled 2016-04-06: qty 1

## 2016-04-06 MED ORDER — CARVEDILOL 12.5 MG PO TABS
12.5000 mg | ORAL_TABLET | Freq: Once | ORAL | Status: AC
  Administered 2016-04-06: 12.5 mg via ORAL
  Filled 2016-04-06: qty 1

## 2016-04-06 NOTE — ED Triage Notes (Signed)
Pt presents with c/o syncopal episode. The episode occurred this afternoon just prior to arrival. His wife found him lying on the living room floor and was able to wake him up and he got himself out of the floor at that point. He does not remember losing consciousness, he last remembers being very sweaty in the kitchen and then woke to his wife on the living room floor. His family says that hes had cough and cold symptoms this week and has been progressively weak. He has been  c/o pain in his chest when he coughs. This episode occurred after he drank cough syrup.

## 2016-04-06 NOTE — ED Notes (Signed)
Will notify RN of increasing BP

## 2016-04-06 NOTE — ED Notes (Signed)
Pt ambulated well in hallway. Pt denies dizziness/lightheadedness.

## 2016-04-06 NOTE — ED Provider Notes (Signed)
[facility] DEPT Provider Note   CSN: [id number] Arrival date & time: 04/06/16  1454     History   Chief Complaint Chief Complaint  Patient presents with  . Loss of Consciousness    HPI [name] is a 76 y.o. male.  The history is provided by the spouse and the patient.  Near Syncope  This is a new problem. The current episode started 3 to 5 hours ago. Episode frequency: twice. The problem has been resolved. Pertinent negatives include no chest pain, no abdominal pain, no headaches and no shortness of breath. Exacerbated by: standing up. Relieved by: resting. He has tried nothing for the symptoms. The treatment provided no relief.  URI   This is a new problem. Episode onset: 5 days. The problem has not changed since onset.There has been no fever. Associated symptoms include congestion, rhinorrhea, sore throat and cough (dry). Pertinent negatives include no chest pain, no abdominal pain, no diarrhea, no nausea, no vomiting and no headaches. Treatments tried: nite time cold and flu (which he took prior to these episodes)   Patient's wife reports that the patient complained of feeling like he was given a pass out and she was able to assist him to the couch. Patient never fell or had any head trauma. Patient lay on the couch for several hours and attempted to get up however the symptoms returned upon doing so.  They report that the patient has been trying to hydrate however today he has had very little to eat or drink.  Past Medical History:  Diagnosis Date  . Coronary artery disease   . Hypertension     Patient Active Problem List   Diagnosis Date Noted  . pandiverticulosis 04/22/2012  . Internal hemorrhoids 04/19/2012  . Syncope and collapse 04/17/2012  . Polycythemia vera(238.4) 04/17/2012  . Chest pain 04/16/2012  . Hypertensive urgency, malignant 04/16/2012  . Hyperlipidemia 06/14/2008  . Hypertensive heart disease 06/14/2008  . Coronary Artery Disease s/p BMS  to CFX and OM1 in 2008 06/14/2008    Past Surgical History:  Procedure Laterality Date  . CORONARY STENT PLACEMENT         Home Medications    Prior to Admission medications   Medication Sig Start Date End Date Taking? Authorizing Provider  aspirin 81 MG chewable tablet Chew 81 mg by mouth daily as needed (pain).    Yes Historical Provider, MD  carvedilol (COREG) 12.5 MG tablet Take 1 tablet (12.5 mg total) by mouth 2 (two) times daily. 01/20/16  Yes Brittainy Erie Noe, PA-C  DM-Doxylamine-Acetaminophen (NITE-TIME COLD/FLU RELIEF) 15-6.25-325 MG/15ML LIQD Take 15 mLs by mouth at bedtime as needed.   Yes Historical Provider, MD  hydrochlorothiazide (HYDRODIURIL) 25 MG tablet Take 1 tablet (25 mg total) by mouth daily. 01/20/16  Yes Brittainy Erie Noe, PA-C  simvastatin (ZOCOR) 20 MG tablet Take 1 tablet (20 mg total) by mouth at bedtime. 01/20/16  Yes Brittainy Erie Noe, PA-C  valsartan (DIOVAN) 320 MG tablet Take 1 tablet (320 mg total) by mouth daily. 03/29/16  Yes Sherren Mocha, MD  benzonatate (TESSALON) 100 MG capsule Take 1 capsule (100 mg total) by mouth every 8 (eight) hours. 04/06/16 04/11/16  Fatima Blank, MD    Family History Family History  Problem Relation Age of Onset  . Heart disease Mother   . Cancer Sister     Social History Social History  Substance Use Topics  . Smoking status: Never Smoker  . Smokeless tobacco: Never Used  .  Alcohol use No     Allergies   Lipitor [atorvastatin] and Pork-derived products   Review of Systems Review of Systems  HENT: Positive for congestion, rhinorrhea and sore throat.   Respiratory: Positive for cough (dry). Negative for shortness of breath.   Cardiovascular: Positive for near-syncope. Negative for chest pain.  Gastrointestinal: Negative for abdominal pain, diarrhea, nausea and vomiting.  Neurological: Negative for headaches.   Ten systems are reviewed and are negative for acute change except as noted in the  HPI   Physical Exam Updated Vital Signs BP 153/93 (BP Location: Right Arm)   Pulse (!) 59   Temp 98.6 F (37 C) (Oral)   Resp 19   SpO2 97%   Physical Exam  Constitutional: He is oriented to person, place, and time. He appears well-developed and well-nourished. No distress.  HENT:  Head: Normocephalic and atraumatic.  Nose: Nose normal.  Mouth/Throat: Posterior oropharyngeal erythema present. No oropharyngeal exudate, posterior oropharyngeal edema or tonsillar abscesses. No tonsillar exudate.  Eyes: Conjunctivae and EOM are normal. Pupils are equal, round, and reactive to light. Right eye exhibits no discharge. Left eye exhibits no discharge. No scleral icterus.  Neck: Normal range of motion. Neck supple.  Cardiovascular: Normal rate and regular rhythm.  Exam reveals no gallop and no friction rub.   No murmur heard. Pulmonary/Chest: Effort normal and breath sounds normal. No stridor. No respiratory distress. He has no rales.  Abdominal: Soft. He exhibits no distension. There is no tenderness.  Musculoskeletal: He exhibits no edema or tenderness.  Neurological: He is alert and oriented to person, place, and time.  Skin: Skin is warm and dry. No rash noted. He is not diaphoretic. No erythema.  Psychiatric: He has a normal mood and affect.  Vitals reviewed.    ED Treatments / Results  Labs (all labs ordered are listed, but only abnormal results are displayed) Labs Reviewed  BASIC METABOLIC PANEL - Abnormal; Notable for the following:       Result Value   Chloride 100 (*)    Glucose, Bld 112 (*)    All other components within normal limits  CBC  CBG MONITORING, ED  I-STAT TROPOININ, ED  I-STAT TROPOININ, ED    EKG  EKG Interpretation  Date/Time:  Friday April 06 2016 15:37:55 EST Ventricular Rate:  66 PR Interval:  148 QRS Duration: 80 QT Interval:  412 QTC Calculation: 431 R Axis:   -12 Text Interpretation:  Normal sinus rhythm Minimal voltage criteria for  LVH, may be normal variant Borderline ECG No significant change since last tracing Confirmed by [facility] MD, Manessa Buley ([id number]) on 04/06/2016 6:04:11 PM       Radiology Dg Chest 2 View  Result Date: 04/06/2016 CLINICAL DATA:  Pt states mid chest pains x4 days, more so today and he fell due to weakness, hx cardiac stents, non smoker, hx htn,cough as well EXAM: CHEST  2 VIEW COMPARISON:  12/09/2014 FINDINGS: The cardiac silhouette is normal in size. No mediastinal or hilar masses. No evidence of adenopathy. Mild scarring or subsegmental atelectasis at the anterior lung base on the left. Lungs are otherwise clear. No pleural effusion. No pneumothorax. Skeletal structures are demineralized but intact. IMPRESSION: No active cardiopulmonary disease. Electronically Signed   By: Lajean Manes M.D.   On: 04/06/2016 16:23    Procedures Procedures (including critical care time)  Medications Ordered in ED Medications  hydrochlorothiazide (MICROZIDE) capsule 12.5 mg (12.5 mg Oral Given 04/06/16 2046)  sodium chloride 0.9 % bolus  1,000 mL (0 mLs Intravenous Stopped 04/06/16 1819)  carvedilol (COREG) tablet 12.5 mg (12.5 mg Oral Given 04/06/16 1932)  sodium chloride 0.9 % bolus 1,000 mL (0 mLs Intravenous Stopped 04/06/16 2129)  benzonatate (TESSALON) capsule 100 mg (100 mg Oral Given 04/06/16 2046)     Initial Impression / Assessment and Plan / ED Course  I have reviewed the triage vital signs and the nursing notes.  Pertinent labs & imaging results that were available during my care of the patient were reviewed by me and considered in my medical decision making (see chart for details).     1. Near Syncope Likely secondary to dehydration. Patient's vitals are not orthostatic however patient is symptomatic with change of position. Patient provided with IV fluids and screening labs obtained. Labs were reassuring. Significant improvement following 2 L of IV fluids. EKG without acute ischemic changes, arrhythmias,  blocks. Troponin negative 2. Do not feel that this is secondary to cardiac etiology and does feel that this workup was sufficient to rule this out. Low suspicion for pulmonary embolism. She was able to ambulate in the emergency department without return of symptoms.  2. URI/laryngitis Likely viral process. Chest x-ray without evidence of pneumonia. No evidence suggesting peritonsillar abscess or airway compromise.  The patient is safe for discharge with strict return precautions.   Final Clinical Impressions(s) / ED Diagnoses   Final diagnoses:  Near syncope  Laryngitis  Upper respiratory tract infection, unspecified type   Disposition: Discharge  Condition: Good  I have discussed the results, Dx and Tx plan with the patient who expressed understanding and agree(s) with the plan. Discharge instructions discussed at great length. The patient was given strict return precautions who verbalized understanding of the instructions. No further questions at time of discharge.    New Prescriptions   BENZONATATE (TESSALON) 100 MG CAPSULE    Take 1 capsule (100 mg total) by mouth every 8 (eight) hours.    Follow Up: Primary care Provider  Schedule an appointment as soon as possible for a visit  in 3-5 days for close follow up for blood pressure management      Fatima Blank, MD 04/06/16 2356

## 2016-04-25 ENCOUNTER — Ambulatory Visit: Payer: Medicare HMO

## 2016-04-25 ENCOUNTER — Other Ambulatory Visit: Payer: Medicare HMO

## 2016-04-30 NOTE — Progress Notes (Deleted)
Patient ID: [name]                 DOB: 1940-11-23                      MRN: [id number]     HPI: [name] is a 76 y.o. male patient of Dr. Copper with PMH below who presents today for hypertension evaluation. At his most recent office visit about 1 month ago his valsartan was increased to 320mg  daily. He had a metabolic panel about 2 weeks after starting increased dose of valsartan and labs were WNL.    Cardiac Hx: CAD, s/p PCI in 2008, malignant hypertension  Current HTN meds:  Carvedilol 12.5mg  BID Valsartan 320mg  daily Hydrochlorothiazide 25mg  daily   Previously tried: None  BP goal: <130/80  Family History: Mother had HTN he believes. Both of his parents are deceased.   Social History: No tobacco or alcohol.   Diet: Follows low sodium diet. Most meals from home. Eats a lot of vegetables. Denies coffee and soda. Does drink natural teas occasionally.   Exercise: No formal exercise. Works in the yard in the summer.   Home BP readings: 140s/80s per patient report  Wt Readings from Last 3 Encounters:  02/29/16 184 lb 12.8 oz (83.8 kg)  01/20/16 182 lb 3.2 oz (82.6 kg)  12/09/14 181 lb 12.8 oz (82.5 kg)   BP Readings from Last 3 Encounters:  04/06/16 177/92  03/28/16 (!) 162/96  02/29/16 (!) 170/92   Pulse Readings from Last 3 Encounters:  04/06/16 (!) 56  03/28/16 (!) 59  02/29/16 64    Renal function: CrCl cannot be calculated (Patient's most recent lab result is older than the maximum 21 days allowed.).  Past Medical History:  Diagnosis Date  . Coronary artery disease   . Hypertension     Current Outpatient Prescriptions on File Prior to Visit  Medication Sig Dispense Refill  . aspirin 81 MG chewable tablet Chew 81 mg by mouth daily as needed (pain).     . carvedilol (COREG) 12.5 MG tablet Take 1 tablet (12.5 mg total) by mouth 2 (two) times daily. 180 tablet 3  . DM-Doxylamine-Acetaminophen (NITE-TIME COLD/FLU RELIEF) 15-6.25-325 MG/15ML  LIQD Take 15 mLs by mouth at bedtime as needed.    . hydrochlorothiazide (HYDRODIURIL) 25 MG tablet Take 1 tablet (25 mg total) by mouth daily. 90 tablet 3  . simvastatin (ZOCOR) 20 MG tablet Take 1 tablet (20 mg total) by mouth at bedtime. 90 tablet 3  . valsartan (DIOVAN) 320 MG tablet Take 1 tablet (320 mg total) by mouth daily. 90 tablet 1   No current facility-administered medications on file prior to visit.     Allergies  Allergen Reactions  . Lipitor [Atorvastatin] Shortness Of Breath  . Pork-Derived Products Other (See Comments)    No pork products - unspecified    There were no vitals taken for this visit.   Assessment/Plan: Hypertension:   Thank you, Lelan Pons. Patterson Hammersmith, [facility]  04/30/2016 3:45 PM

## 2016-05-02 ENCOUNTER — Telehealth: Payer: Self-pay | Admitting: Pharmacist

## 2016-05-02 ENCOUNTER — Encounter (HOSPITAL_COMMUNITY): Payer: Self-pay | Admitting: Radiology

## 2016-05-02 ENCOUNTER — Inpatient Hospital Stay (HOSPITAL_COMMUNITY)
Admission: EM | Admit: 2016-05-02 | Discharge: 2016-05-04 | DRG: 066 | Disposition: A | Discharge status: Home / Self Care | Payer: Medicare HMO | Attending: Infectious Disease | Admitting: Infectious Disease

## 2016-05-02 ENCOUNTER — Other Ambulatory Visit: Payer: Medicare HMO | Admitting: *Deleted

## 2016-05-02 ENCOUNTER — Ambulatory Visit (INDEPENDENT_AMBULATORY_CARE_PROVIDER_SITE_OTHER): Payer: Medicare HMO

## 2016-05-02 ENCOUNTER — Emergency Department (HOSPITAL_COMMUNITY): Payer: Medicare HMO

## 2016-05-02 ENCOUNTER — Telehealth: Payer: Self-pay

## 2016-05-02 ENCOUNTER — Inpatient Hospital Stay (HOSPITAL_COMMUNITY): Payer: Medicare HMO

## 2016-05-02 DIAGNOSIS — H532 Diplopia: Secondary | ICD-10-CM | POA: Diagnosis not present

## 2016-05-02 DIAGNOSIS — I119 Hypertensive heart disease without heart failure: Secondary | ICD-10-CM | POA: Diagnosis not present

## 2016-05-02 DIAGNOSIS — E785 Hyperlipidemia, unspecified: Secondary | ICD-10-CM | POA: Diagnosis not present

## 2016-05-02 DIAGNOSIS — R262 Difficulty in walking, not elsewhere classified: Secondary | ICD-10-CM | POA: Diagnosis present

## 2016-05-02 DIAGNOSIS — R29704 NIHSS score 4: Secondary | ICD-10-CM | POA: Diagnosis present

## 2016-05-02 DIAGNOSIS — I639 Cerebral infarction, unspecified: Secondary | ICD-10-CM | POA: Diagnosis not present

## 2016-05-02 DIAGNOSIS — I2510 Atherosclerotic heart disease of native coronary artery without angina pectoris: Secondary | ICD-10-CM | POA: Diagnosis present

## 2016-05-02 DIAGNOSIS — Z6828 Body mass index (BMI) 28.0-28.9, adult: Secondary | ICD-10-CM

## 2016-05-02 DIAGNOSIS — Z8249 Family history of ischemic heart disease and other diseases of the circulatory system: Secondary | ICD-10-CM

## 2016-05-02 DIAGNOSIS — Z7982 Long term (current) use of aspirin: Secondary | ICD-10-CM

## 2016-05-02 DIAGNOSIS — H510 Palsy (spasm) of conjugate gaze: Secondary | ICD-10-CM | POA: Diagnosis present

## 2016-05-02 DIAGNOSIS — I6330 Cerebral infarction due to thrombosis of unspecified cerebral artery: Secondary | ICD-10-CM | POA: Diagnosis not present

## 2016-05-02 DIAGNOSIS — E663 Overweight: Secondary | ICD-10-CM | POA: Diagnosis present

## 2016-05-02 DIAGNOSIS — Z809 Family history of malignant neoplasm, unspecified: Secondary | ICD-10-CM

## 2016-05-02 DIAGNOSIS — I110 Hypertensive heart disease with heart failure: Secondary | ICD-10-CM

## 2016-05-02 DIAGNOSIS — R402362 Coma scale, best motor response, obeys commands, at arrival to emergency department: Secondary | ICD-10-CM | POA: Diagnosis present

## 2016-05-02 DIAGNOSIS — Z888 Allergy status to other drugs, medicaments and biological substances status: Secondary | ICD-10-CM | POA: Diagnosis not present

## 2016-05-02 DIAGNOSIS — R42 Dizziness and giddiness: Secondary | ICD-10-CM | POA: Diagnosis not present

## 2016-05-02 DIAGNOSIS — Z955 Presence of coronary angioplasty implant and graft: Secondary | ICD-10-CM | POA: Diagnosis not present

## 2016-05-02 DIAGNOSIS — I739 Peripheral vascular disease, unspecified: Secondary | ICD-10-CM | POA: Diagnosis present

## 2016-05-02 DIAGNOSIS — R471 Dysarthria and anarthria: Secondary | ICD-10-CM | POA: Diagnosis not present

## 2016-05-02 DIAGNOSIS — Z79899 Other long term (current) drug therapy: Secondary | ICD-10-CM | POA: Diagnosis not present

## 2016-05-02 DIAGNOSIS — I638 Other cerebral infarction: Secondary | ICD-10-CM | POA: Diagnosis not present

## 2016-05-02 DIAGNOSIS — Z91018 Allergy to other foods: Secondary | ICD-10-CM | POA: Diagnosis not present

## 2016-05-02 DIAGNOSIS — R402142 Coma scale, eyes open, spontaneous, at arrival to emergency department: Secondary | ICD-10-CM | POA: Diagnosis present

## 2016-05-02 DIAGNOSIS — R29810 Facial weakness: Secondary | ICD-10-CM | POA: Diagnosis not present

## 2016-05-02 DIAGNOSIS — R402252 Coma scale, best verbal response, oriented, at arrival to emergency department: Secondary | ICD-10-CM | POA: Diagnosis present

## 2016-05-02 DIAGNOSIS — I6523 Occlusion and stenosis of bilateral carotid arteries: Secondary | ICD-10-CM | POA: Diagnosis not present

## 2016-05-02 DIAGNOSIS — I10 Essential (primary) hypertension: Secondary | ICD-10-CM | POA: Diagnosis not present

## 2016-05-02 DIAGNOSIS — S199XXA Unspecified injury of neck, initial encounter: Secondary | ICD-10-CM | POA: Diagnosis not present

## 2016-05-02 DIAGNOSIS — R51 Headache: Secondary | ICD-10-CM | POA: Diagnosis not present

## 2016-05-02 DIAGNOSIS — I6789 Other cerebrovascular disease: Secondary | ICD-10-CM | POA: Diagnosis present

## 2016-05-02 DIAGNOSIS — S069X9A Unspecified intracranial injury with loss of consciousness of unspecified duration, initial encounter: Secondary | ICD-10-CM | POA: Diagnosis not present

## 2016-05-02 HISTORY — DX: Cerebral infarction, unspecified: I63.9

## 2016-05-02 LAB — I-STAT TROPONIN, ED: TROPONIN I, POC: 0 ng/mL (ref 0.00–0.08)

## 2016-05-02 LAB — DIFFERENTIAL
BASOS ABS: 0 10*3/uL (ref 0.0–0.1)
Basophils Relative: 0 %
EOS ABS: 0.2 10*3/uL (ref 0.0–0.7)
EOS PCT: 3 %
Lymphocytes Relative: 22 %
Lymphs Abs: 1.9 10*3/uL (ref 0.7–4.0)
MONO ABS: 0.5 10*3/uL (ref 0.1–1.0)
MONOS PCT: 6 %
Neutro Abs: 5.8 10*3/uL (ref 1.7–7.7)
Neutrophils Relative %: 69 %

## 2016-05-02 LAB — I-STAT CHEM 8, ED
BUN: 16 mg/dL (ref 6–20)
CREATININE: 0.8 mg/dL (ref 0.61–1.24)
Calcium, Ion: 1.19 mmol/L (ref 1.15–1.40)
Chloride: 105 mmol/L (ref 101–111)
Glucose, Bld: 119 mg/dL — ABNORMAL HIGH (ref 65–99)
HEMATOCRIT: 42 % (ref 39.0–52.0)
HEMOGLOBIN: 14.3 g/dL (ref 13.0–17.0)
POTASSIUM: 3.6 mmol/L (ref 3.5–5.1)
SODIUM: 140 mmol/L (ref 135–145)
TCO2: 26 mmol/L (ref 0–100)

## 2016-05-02 LAB — BASIC METABOLIC PANEL
BUN / CREAT RATIO: 16 (ref 10–24)
BUN: 14 mg/dL (ref 8–27)
CO2: 21 mmol/L (ref 18–29)
CREATININE: 0.86 mg/dL (ref 0.76–1.27)
Calcium: 9.3 mg/dL (ref 8.6–10.2)
Chloride: 102 mmol/L (ref 96–106)
GFR calc Af Amer: 99 mL/min/{1.73_m2} (ref >59)
GFR, EST NON AFRICAN AMERICAN: 86 mL/min/{1.73_m2} (ref >59)
GLUCOSE: 122 mg/dL — AB (ref 65–99)
Potassium: 4.1 mmol/L (ref 3.5–5.2)
SODIUM: 141 mmol/L (ref 134–144)

## 2016-05-02 LAB — COMPREHENSIVE METABOLIC PANEL
ALT: 18 U/L (ref 17–63)
ANION GAP: 7 (ref 5–15)
AST: 19 U/L (ref 15–41)
Albumin: 3.7 g/dL (ref 3.5–5.0)
Alkaline Phosphatase: 50 U/L (ref 38–126)
BUN: 13 mg/dL (ref 6–20)
CO2: 23 mmol/L (ref 22–32)
Calcium: 9.2 mg/dL (ref 8.9–10.3)
Chloride: 107 mmol/L (ref 101–111)
Creatinine, Ser: 0.8 mg/dL (ref 0.61–1.24)
Glucose, Bld: 121 mg/dL — ABNORMAL HIGH (ref 65–99)
POTASSIUM: 3.7 mmol/L (ref 3.5–5.1)
Sodium: 137 mmol/L (ref 135–145)
TOTAL PROTEIN: 6.6 g/dL (ref 6.5–8.1)
Total Bilirubin: 0.9 mg/dL (ref 0.3–1.2)

## 2016-05-02 LAB — CBC
HEMATOCRIT: 42.6 % (ref 39.0–52.0)
HEMOGLOBIN: 14.9 g/dL (ref 13.0–17.0)
MCH: 31.8 pg (ref 26.0–34.0)
MCHC: 35 g/dL (ref 30.0–36.0)
MCV: 91 fL (ref 78.0–100.0)
Platelets: 171 10*3/uL (ref 150–400)
RBC: 4.68 MIL/uL (ref 4.22–5.81)
RDW: 13.8 % (ref 11.5–15.5)
WBC: 8.4 10*3/uL (ref 4.0–10.5)

## 2016-05-02 LAB — APTT: APTT: 22 s — AB (ref 24–36)

## 2016-05-02 LAB — PROTIME-INR
INR: 1.04
Prothrombin Time: 13.6 seconds (ref 11.4–15.2)

## 2016-05-02 IMAGING — CT CT ANGIO HEAD
1 of 14 series · 1 of 33 positions shown · IV contrast (isovue)
Comparison: None.

CLINICAL DATA: Headache and double vision. Loss consciousness. Fell
yesterday. Slurred speech.

EXAM:
CT HEAD WITHOUT CONTRAST
CT ANGIOGRAPHY OF THE HEAD AND NECK
TECHNIQUE: Contiguous axial images were obtained from the base of the skull
through the vertex without intravenous contrast. Multidetector CT
imaging of the head and neck was performed using the standard
protocol during bolus administration of intravenous contrast.
Multiplanar CT image reconstructions and MIPs were obtained to
evaluate the vascular anatomy. Carotid stenosis measurements (when
applicable) are obtained utilizing NASCET criteria, using the distal
internal carotid diameter as the denominator.
CONTRAST:  50 cc Isovue 370

[Series 300: locator · axial · 0.49mm/px · 1 of 1 slices shown]
[im 1/1  soft-tissue]
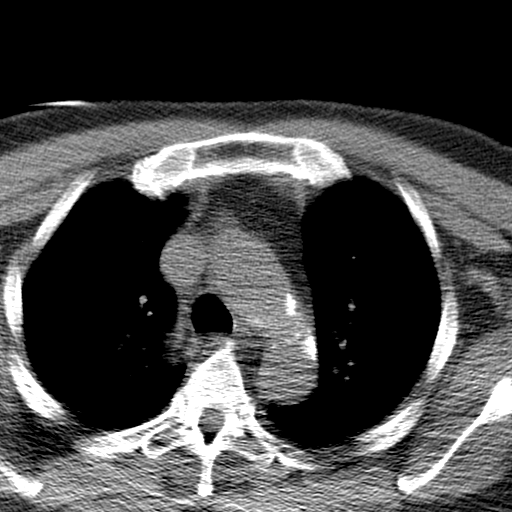

[1 of 33 positions shown; findings below may reference images not displayed]

FINDINGS: CT HEAD

Brain: Age related atrophy. Chronic small-vessel ischemic changes
affecting the cerebral hemispheric deep and subcortical white
matter. No sign of acute infarction, mass lesion, hemorrhage,
hydrocephalus or extra-axial collection.

Vascular: There is atherosclerotic calcification of the major
vessels at the base of the brain.

Skull: Negative

Sinuses/Orbits: Mucosal inflammatory changes of the sinuses with
retention cysts and/or polyps. Orbits negative.

CTA NECK

Aortic arch: Aortic atherosclerosis. No aneurysm or dissection.
Branching pattern of the brachiocephalic vessels is normal.

Right carotid system: Common carotid artery widely patent to the
bifurcation. Soft plaque within the ICA bulb but no stenosis of the
limb an beyond that of the more distal cervical ICA. No
irregularity.

Left carotid system: Common carotid artery widely patent to the
bifurcation. Mild plaque at the carotid bifurcation and ICA bulb but
no narrowing of the ICA laminae beyond that of the more distal
cervical ICA and therefore no stenosis.

Vertebral arteries:Left vertebral artery dominant. Both vertebral
artery origins show mild calcification but without flow limiting
stenosis. Beyond the origins, the vessels appear widely patent
through the cervical region.

Skeleton: Ordinary cervical spondylosis.

Other neck: No significant soft tissue neck lesion. Lung apices
clear.

CTA HEAD

Anterior circulation: Both internal carotid arteries patent through
the skullbase and siphon regions. Peripheral atherosclerotic
calcification in both carotid siphon regions with stenosis estimated
at 30-50% bilaterally. Anterior and middle cerebral vessels are
patent without proximal stenosis, aneurysm or vascular malformation.
No missing branch vessels identified.

Posterior circulation: Both vertebral arteries patent through the
foramen magnum with the left being dominant. Both vertebral arteries
supply the basilar. No basilar stenosis. Posterior circulation
branch vessels are patent without stenosis. No posterior circulation
aneurysm.

Venous sinuses: Patent and normal.

Anatomic variants: None significant

Delayed phase: No abnormal enhancement
IMPRESSION: No acute finding. No evidence of acute stroke. No evidence of
aneurysm or hemorrhage. Chronic small-vessel ischemic changes of the
cerebral hemispheric white matter.

Atherosclerotic change at both carotid bifurcations and both
vertebral artery origins but without measurable stenosis of the
carotids. Mild narrowing of the proximal vertebral arteries, not
flow limiting.

Atherosclerotic disease in both carotid siphon regions with
narrowing estimated at 30-50% bilaterally.

## 2016-05-02 MED ORDER — SODIUM CHLORIDE 0.9% FLUSH
3.0000 mL | Freq: Two times a day (BID) | INTRAVENOUS | Status: DC
  Administered 2016-05-02 – 2016-05-04 (×5): 3 mL via INTRAVENOUS

## 2016-05-02 MED ORDER — ASPIRIN 325 MG PO TABS: 325.0000 mg | ORAL_TABLET | Freq: Every day | ORAL | Status: DC

## 2016-05-02 MED ORDER — IOPAMIDOL (ISOVUE-370) INJECTION 76%
INTRAVENOUS | Status: AC
  Administered 2016-05-02: 50 mL
  Filled 2016-05-02: qty 50

## 2016-05-02 MED ORDER — ACETAMINOPHEN 650 MG RE SUPP: 650.0000 mg | Freq: Four times a day (QID) | RECTAL | Status: DC | PRN

## 2016-05-02 MED ORDER — SIMVASTATIN 20 MG PO TABS
20.0000 mg | ORAL_TABLET | Freq: Every day | ORAL | Status: DC
  Administered 2016-05-02: 20 mg via ORAL
  Filled 2016-05-02: qty 1

## 2016-05-02 MED ORDER — ASPIRIN 325 MG PO TABS
325.0000 mg | ORAL_TABLET | Freq: Once | ORAL | Status: AC
  Administered 2016-05-02: 325 mg via ORAL
  Filled 2016-05-02: qty 1

## 2016-05-02 MED ORDER — ACETAMINOPHEN 325 MG PO TABS: 650.0000 mg | ORAL_TABLET | Freq: Four times a day (QID) | ORAL | Status: DC | PRN

## 2016-05-02 MED ORDER — ENOXAPARIN SODIUM 40 MG/0.4ML ~~LOC~~ SOLN: 40.0000 mg | SUBCUTANEOUS | Status: DC

## 2016-05-02 NOTE — ED Provider Notes (Signed)
[facility] DEPT Provider Note   CSN: [id number] Arrival date & time: 05/02/16  0102     History   Chief Complaint Chief Complaint  Patient presents with  . Loss of Vision  . Fall  . Dizziness    HPI [name] is a 76 y.o. male.She is obtained from patient and from medical interpreter and from his wife. Patient speaks Spanish as negative language. Patient developed diplopia and feeling of off balance yesterday approximately 12 noon. He also was noted to have slurred speech and right facial droop per his wife at that time. Symptoms worse with changing positions or intending to walk. No other associated symptoms. He did not have syncope. No treatment prior to coming here denies pain anywhere.  HPI  Past Medical History:  Diagnosis Date  . Coronary artery disease   . Hypertension     Patient Active Problem List   Diagnosis Date Noted  . pandiverticulosis 04/22/2012  . Internal hemorrhoids 04/19/2012  . Syncope and collapse 04/17/2012  . Polycythemia vera(238.4) 04/17/2012  . Chest pain 04/16/2012  . Hypertensive urgency, malignant 04/16/2012  . Hyperlipidemia 06/14/2008  . Hypertensive heart disease 06/14/2008  . Coronary Artery Disease s/p BMS to CFX and OM1 in 2008 06/14/2008    Past Surgical History:  Procedure Laterality Date  . CORONARY STENT PLACEMENT         Home Medications    Prior to Admission medications   Medication Sig Start Date End Date Taking? Authorizing Provider  aspirin 81 MG chewable tablet Chew 81 mg by mouth daily as needed (pain).     Historical Provider, MD  carvedilol (COREG) 12.5 MG tablet Take 1 tablet (12.5 mg total) by mouth 2 (two) times daily. 01/20/16   Brittainy Erie Noe, PA-C  DM-Doxylamine-Acetaminophen (NITE-TIME COLD/FLU RELIEF) 15-6.25-325 MG/15ML LIQD Take 15 mLs by mouth at bedtime as needed.    Historical Provider, MD  hydrochlorothiazide (HYDRODIURIL) 25 MG tablet Take 1 tablet (25 mg total) by mouth daily.  01/20/16   Brittainy Erie Noe, PA-C  simvastatin (ZOCOR) 20 MG tablet Take 1 tablet (20 mg total) by mouth at bedtime. 01/20/16   Brittainy M Simmons, PA-C  valsartan (DIOVAN) 320 MG tablet Take 1 tablet (320 mg total) by mouth daily. 03/29/16   Sherren Mocha, MD    Family History Family History  Problem Relation Age of Onset  . Heart disease Mother   . Cancer Sister     Social History Social History  Substance Use Topics  . Smoking status: Never Smoker  . Smokeless tobacco: Never Used  . Alcohol use No     Allergies   Lipitor [atorvastatin] and Pork-derived products   Review of Systems Review of Systems  Constitutional: Negative.   HENT: Negative.   Eyes: Positive for visual disturbance.       Diplopia  Respiratory: Negative.   Cardiovascular: Negative.   Gastrointestinal: Negative.   Musculoskeletal: Negative.   Skin: Negative.   Neurological: Positive for facial asymmetry and speech difficulty.  Psychiatric/Behavioral: Negative.   All other systems reviewed and are negative.    Physical Exam Updated Vital Signs BP 164/93   Pulse (!) 57   Temp 97.9 F (36.6 C) (Oral)   Resp 21   Ht 5\' 7"  (1.702 m)   Wt 184 lb (83.5 kg)   SpO2 94%   BMI 28.82 kg/m   Physical Exam  Constitutional: He appears well-developed and well-nourished.  HENT:  Head: Normocephalic and atraumatic.  Eyes: Conjunctivae are normal.  Pupils are equal, round, and reactive to light.  Patient is unable to look past midline with right eye when asked to look to the left.  Neck: Neck supple. No tracheal deviation present. No thyromegaly present.  Cardiovascular: Normal rate and regular rhythm.   No murmur heard. Pulmonary/Chest: Effort normal and breath sounds normal.  Abdominal: Soft. Bowel sounds are normal. He exhibits no distension. There is no tenderness.  Musculoskeletal: Normal range of motion. He exhibits no edema or tenderness.  Neurological: He is alert. He displays normal  reflexes. Coordination normal.  Right sided cranial nerve III palsy other cranial nerves II through XII grossly intact. Motor strength 5 over 5 overall. DTRs symmetric bilaterally at knee jerk ankle jerk and biceps toes downward going bilaterally.  Skin: Skin is warm and dry. No rash noted.  Psychiatric: He has a normal mood and affect.  Nursing note and vitals reviewed.    ED Treatments / Results  Labs (all labs ordered are listed, but only abnormal results are displayed) Labs Reviewed  I-STAT CHEM 8, ED - Abnormal; Notable for the following:       Result Value   Glucose, Bld 119 (*)    All other components within normal limits  CBC  DIFFERENTIAL  PROTIME-INR  APTT  COMPREHENSIVE METABOLIC PANEL  I-STAT TROPOININ, ED  CBG MONITORING, ED    EKG  EKG Interpretation  Date/Time:  Wednesday May 02 2016 09:38:52 EDT Ventricular Rate:  62 PR Interval:    QRS Duration: 85 QT Interval:  426 QTC Calculation: 433 R Axis:   35 Text Interpretation:  Sinus rhythm Abnormal R-wave progression, early transition No significant change since last tracing Confirmed by Winfred Leeds  MD, Shonia Skilling [phone]) on 05/02/2016 10:03:14 AM Also confirmed by Winfred Leeds  MD, Tokiko Diefenderfer [phone]), editor Drema Pry [phone])  on 05/02/2016 10:19:01 AM      Results for orders placed or performed during the hospital encounter of 05/02/16  Protime-INR  Result Value Ref Range   Prothrombin Time 13.6 11.4 - 15.2 seconds   INR 1.04   APTT  Result Value Ref Range   aPTT 22 (L) 24 - 36 seconds  CBC  Result Value Ref Range   WBC 8.4 4.0 - 10.5 K/uL   RBC 4.68 4.22 - 5.81 MIL/uL   Hemoglobin 14.9 13.0 - 17.0 g/dL   HCT 42.6 39.0 - 52.0 %   MCV 91.0 78.0 - 100.0 fL   MCH 31.8 26.0 - 34.0 pg   MCHC 35.0 30.0 - 36.0 g/dL   RDW 13.8 11.5 - 15.5 %   Platelets 171 150 - 400 K/uL  Differential  Result Value Ref Range   Neutrophils Relative % 69 %   Neutro Abs 5.8 1.7 - 7.7 K/uL   Lymphocytes Relative 22 %    Lymphs Abs 1.9 0.7 - 4.0 K/uL   Monocytes Relative 6 %   Monocytes Absolute 0.5 0.1 - 1.0 K/uL   Eosinophils Relative 3 %   Eosinophils Absolute 0.2 0.0 - 0.7 K/uL   Basophils Relative 0 %   Basophils Absolute 0.0 0.0 - 0.1 K/uL  Comprehensive metabolic panel  Result Value Ref Range   Sodium 137 135 - 145 mmol/L   Potassium 3.7 3.5 - 5.1 mmol/L   Chloride 107 101 - 111 mmol/L   CO2 23 22 - 32 mmol/L   Glucose, Bld 121 (H) 65 - 99 mg/dL   BUN 13 6 - 20 mg/dL   Creatinine, Ser 0.80 0.61 - 1.24 mg/dL  Calcium 9.2 8.9 - 10.3 mg/dL   Total Protein 6.6 6.5 - 8.1 g/dL   Albumin 3.7 3.5 - 5.0 g/dL   AST 19 15 - 41 U/L   ALT 18 17 - 63 U/L   Alkaline Phosphatase 50 38 - 126 U/L   Total Bilirubin 0.9 0.3 - 1.2 mg/dL   GFR calc non Af Amer >60 >60 mL/min   GFR calc Af Amer >60 >60 mL/min   Anion gap 7 5 - 15  I-stat troponin, ED  Result Value Ref Range   Troponin i, poc 0.00 0.00 - 0.08 ng/mL   Comment 3          I-Stat Chem 8, ED  Result Value Ref Range   Sodium 140 135 - 145 mmol/L   Potassium 3.6 3.5 - 5.1 mmol/L   Chloride 105 101 - 111 mmol/L   BUN 16 6 - 20 mg/dL   Creatinine, Ser 0.80 0.61 - 1.24 mg/dL   Glucose, Bld 119 (H) 65 - 99 mg/dL   Calcium, Ion 1.19 1.15 - 1.40 mmol/L   TCO2 26 0 - 100 mmol/L   Hemoglobin 14.3 13.0 - 17.0 g/dL   HCT 42.0 39.0 - 52.0 %   Ct Angio Head W Or Wo Contrast  Result Date: 05/02/2016 CLINICAL DATA:  Headache and double vision. Loss consciousness. Fell yesterday. Slurred speech. EXAM: CT HEAD WITHOUT CONTRAST CT ANGIOGRAPHY OF THE HEAD AND NECK TECHNIQUE: Contiguous axial images were obtained from the base of the skull through the vertex without intravenous contrast. Multidetector CT imaging of the head and neck was performed using the standard protocol during bolus administration of intravenous contrast. Multiplanar CT image reconstructions and MIPs were obtained to evaluate the vascular anatomy. Carotid stenosis measurements (when  applicable) are obtained utilizing NASCET criteria, using the distal internal carotid diameter as the denominator. CONTRAST:  50 cc Isovue 370 COMPARISON:  None. FINDINGS: CT HEAD Brain: Age related atrophy. Chronic small-vessel ischemic changes affecting the cerebral hemispheric deep and subcortical white matter. No sign of acute infarction, mass lesion, hemorrhage, hydrocephalus or extra-axial collection. Vascular: There is atherosclerotic calcification of the major vessels at the base of the brain. Skull: Negative Sinuses/Orbits: Mucosal inflammatory changes of the sinuses with retention cysts and/or polyps. Orbits negative. CTA NECK Aortic arch: Aortic atherosclerosis. No aneurysm or dissection. Branching pattern of the brachiocephalic vessels is normal. Right carotid system: Common carotid artery widely patent to the bifurcation. Soft plaque within the ICA bulb but no stenosis of the limb an beyond that of the more distal cervical ICA. No irregularity. Left carotid system: Common carotid artery widely patent to the bifurcation. Mild plaque at the carotid bifurcation and ICA bulb but no narrowing of the ICA laminae beyond that of the more distal cervical ICA and therefore no stenosis. Vertebral arteries:Left vertebral artery dominant. Both vertebral artery origins show mild calcification but without flow limiting stenosis. Beyond the origins, the vessels appear widely patent through the cervical region. Skeleton: Ordinary cervical spondylosis. Other neck: No significant soft tissue neck lesion. Lung apices clear. CTA HEAD Anterior circulation: Both internal carotid arteries patent through the skullbase and siphon regions. Peripheral atherosclerotic calcification in both carotid siphon regions with stenosis estimated at 30-50% bilaterally. Anterior and middle cerebral vessels are patent without proximal stenosis, aneurysm or vascular malformation. No missing branch vessels identified. Posterior circulation: Both  vertebral arteries patent through the foramen magnum with the left being dominant. Both vertebral arteries supply the basilar. No basilar stenosis. Posterior circulation  branch vessels are patent without stenosis. No posterior circulation aneurysm. Venous sinuses: Patent and normal. Anatomic variants: None significant Delayed phase: No abnormal enhancement IMPRESSION: No acute finding. No evidence of acute stroke. No evidence of aneurysm or hemorrhage. Chronic small-vessel ischemic changes of the cerebral hemispheric white matter. Atherosclerotic change at both carotid bifurcations and both vertebral artery origins but without measurable stenosis of the carotids. Mild narrowing of the proximal vertebral arteries, not flow limiting. Atherosclerotic disease in both carotid siphon regions with narrowing estimated at 30-50% bilaterally. Electronically Signed   By: Nelson Chimes M.D.   On: 05/02/2016 11:45   Dg Chest 2 View  Result Date: 04/06/2016 CLINICAL DATA:  Pt states mid chest pains x4 days, more so today and he fell due to weakness, hx cardiac stents, non smoker, hx htn,cough as well EXAM: CHEST  2 VIEW COMPARISON:  12/09/2014 FINDINGS: The cardiac silhouette is normal in size. No mediastinal or hilar masses. No evidence of adenopathy. Mild scarring or subsegmental atelectasis at the anterior lung base on the left. Lungs are otherwise clear. No pleural effusion. No pneumothorax. Skeletal structures are demineralized but intact. IMPRESSION: No active cardiopulmonary disease. Electronically Signed   By: Lajean Manes M.D.   On: 04/06/2016 16:23   Ct Head Wo Contrast  Result Date: 05/02/2016 CLINICAL DATA:  Headache and double vision. Loss consciousness. Fell yesterday. Slurred speech. EXAM: CT HEAD WITHOUT CONTRAST CT ANGIOGRAPHY OF THE HEAD AND NECK TECHNIQUE: Contiguous axial images were obtained from the base of the skull through the vertex without intravenous contrast. Multidetector CT imaging of the  head and neck was performed using the standard protocol during bolus administration of intravenous contrast. Multiplanar CT image reconstructions and MIPs were obtained to evaluate the vascular anatomy. Carotid stenosis measurements (when applicable) are obtained utilizing NASCET criteria, using the distal internal carotid diameter as the denominator. CONTRAST:  50 cc Isovue 370 COMPARISON:  None. FINDINGS: CT HEAD Brain: Age related atrophy. Chronic small-vessel ischemic changes affecting the cerebral hemispheric deep and subcortical white matter. No sign of acute infarction, mass lesion, hemorrhage, hydrocephalus or extra-axial collection. Vascular: There is atherosclerotic calcification of the major vessels at the base of the brain. Skull: Negative Sinuses/Orbits: Mucosal inflammatory changes of the sinuses with retention cysts and/or polyps. Orbits negative. CTA NECK Aortic arch: Aortic atherosclerosis. No aneurysm or dissection. Branching pattern of the brachiocephalic vessels is normal. Right carotid system: Common carotid artery widely patent to the bifurcation. Soft plaque within the ICA bulb but no stenosis of the limb an beyond that of the more distal cervical ICA. No irregularity. Left carotid system: Common carotid artery widely patent to the bifurcation. Mild plaque at the carotid bifurcation and ICA bulb but no narrowing of the ICA laminae beyond that of the more distal cervical ICA and therefore no stenosis. Vertebral arteries:Left vertebral artery dominant. Both vertebral artery origins show mild calcification but without flow limiting stenosis. Beyond the origins, the vessels appear widely patent through the cervical region. Skeleton: Ordinary cervical spondylosis. Other neck: No significant soft tissue neck lesion. Lung apices clear. CTA HEAD Anterior circulation: Both internal carotid arteries patent through the skullbase and siphon regions. Peripheral atherosclerotic calcification in both carotid  siphon regions with stenosis estimated at 30-50% bilaterally. Anterior and middle cerebral vessels are patent without proximal stenosis, aneurysm or vascular malformation. No missing branch vessels identified. Posterior circulation: Both vertebral arteries patent through the foramen magnum with the left being dominant. Both vertebral arteries supply the basilar. No basilar stenosis.  Posterior circulation branch vessels are patent without stenosis. No posterior circulation aneurysm. Venous sinuses: Patent and normal. Anatomic variants: None significant Delayed phase: No abnormal enhancement IMPRESSION: No acute finding. No evidence of acute stroke. No evidence of aneurysm or hemorrhage. Chronic small-vessel ischemic changes of the cerebral hemispheric white matter. Atherosclerotic change at both carotid bifurcations and both vertebral artery origins but without measurable stenosis of the carotids. Mild narrowing of the proximal vertebral arteries, not flow limiting. Atherosclerotic disease in both carotid siphon regions with narrowing estimated at 30-50% bilaterally. Electronically Signed   By: Nelson Chimes M.D.   On: 05/02/2016 11:45   Ct Angio Neck W And/or Wo Contrast  Result Date: 05/02/2016 CLINICAL DATA:  Headache and double vision. Loss consciousness. Fell yesterday. Slurred speech. EXAM: CT HEAD WITHOUT CONTRAST CT ANGIOGRAPHY OF THE HEAD AND NECK TECHNIQUE: Contiguous axial images were obtained from the base of the skull through the vertex without intravenous contrast. Multidetector CT imaging of the head and neck was performed using the standard protocol during bolus administration of intravenous contrast. Multiplanar CT image reconstructions and MIPs were obtained to evaluate the vascular anatomy. Carotid stenosis measurements (when applicable) are obtained utilizing NASCET criteria, using the distal internal carotid diameter as the denominator. CONTRAST:  50 cc Isovue 370 COMPARISON:  None. FINDINGS:  CT HEAD Brain: Age related atrophy. Chronic small-vessel ischemic changes affecting the cerebral hemispheric deep and subcortical white matter. No sign of acute infarction, mass lesion, hemorrhage, hydrocephalus or extra-axial collection. Vascular: There is atherosclerotic calcification of the major vessels at the base of the brain. Skull: Negative Sinuses/Orbits: Mucosal inflammatory changes of the sinuses with retention cysts and/or polyps. Orbits negative. CTA NECK Aortic arch: Aortic atherosclerosis. No aneurysm or dissection. Branching pattern of the brachiocephalic vessels is normal. Right carotid system: Common carotid artery widely patent to the bifurcation. Soft plaque within the ICA bulb but no stenosis of the limb an beyond that of the more distal cervical ICA. No irregularity. Left carotid system: Common carotid artery widely patent to the bifurcation. Mild plaque at the carotid bifurcation and ICA bulb but no narrowing of the ICA laminae beyond that of the more distal cervical ICA and therefore no stenosis. Vertebral arteries:Left vertebral artery dominant. Both vertebral artery origins show mild calcification but without flow limiting stenosis. Beyond the origins, the vessels appear widely patent through the cervical region. Skeleton: Ordinary cervical spondylosis. Other neck: No significant soft tissue neck lesion. Lung apices clear. CTA HEAD Anterior circulation: Both internal carotid arteries patent through the skullbase and siphon regions. Peripheral atherosclerotic calcification in both carotid siphon regions with stenosis estimated at 30-50% bilaterally. Anterior and middle cerebral vessels are patent without proximal stenosis, aneurysm or vascular malformation. No missing branch vessels identified. Posterior circulation: Both vertebral arteries patent through the foramen magnum with the left being dominant. Both vertebral arteries supply the basilar. No basilar stenosis. Posterior circulation  branch vessels are patent without stenosis. No posterior circulation aneurysm. Venous sinuses: Patent and normal. Anatomic variants: None significant Delayed phase: No abnormal enhancement IMPRESSION: No acute finding. No evidence of acute stroke. No evidence of aneurysm or hemorrhage. Chronic small-vessel ischemic changes of the cerebral hemispheric white matter. Atherosclerotic change at both carotid bifurcations and both vertebral artery origins but without measurable stenosis of the carotids. Mild narrowing of the proximal vertebral arteries, not flow limiting. Atherosclerotic disease in both carotid siphon regions with narrowing estimated at 30-50% bilaterally. Electronically Signed   By: Nelson Chimes M.D.   On: 05/02/2016  11:45    Radiology No results found.  Procedures Procedures (including critical care time)  Medications Ordered in ED Medications  iopamidol (ISOVUE-370) 76 % injection (not administered)     Initial Impression / Assessment and Plan / ED Course  I have reviewed the triage vital signs and the nursing notes. 1:40 PM exam is unchanged. Continues to complain of diplopia and vertigo. He is alert Glasgow Coma Score 15 Pertinent labs & imaging results that were available during my care of the patient were reviewed by me and considered in my medical decision making (see chart for details). NIH stroke scale calculated at 4 by nursing    Neurology consulted and will see patient in the ED. Neurology request CT angiogram of head and neck, ordered by me . Neurology consult reviewed. Aspirin ordered. I consulted Dr. Benjamine Mola from internal medicine service to make arrangements for admission. Concern for posterior circulation stroke Final Clinical Impressions(s) / ED Diagnoses  Diagnosis #1 vertigo #2 diplopia Final diagnoses:  None    New Prescriptions New Prescriptions   No medications on file     Orlie Dakin, MD 05/02/16 1339

## 2016-05-02 NOTE — ED Triage Notes (Signed)
Pt to ED with c/o vision loss in right eye yesterday after shoveling snow, pt states that he passed out after having changes to vision while shoveling snow-- states was seeing double. Denies headache, wife states has some confusion.

## 2016-05-02 NOTE — Telephone Encounter (Signed)
Pt presented for appt today with wife and son for follow up in HTN clinic. Son was irate that appt was only with pharmacist and not with Dr. Burt Knack. Wife reports that pt had fall yesterday with slurred speech and arm numbness just prior to fall. 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 They did not take him to the emergency room as they believed he was going to see Dr. Burt Knack today. Advised that those symptoms are a medical emergency and pt needs to be evaluated ASAP in the ER (they were planning to take him following his visit today). Cancelled visit for today and advised to head straight to the emergency room. Son pulled car around and pt assisted to vehicle.   ER (Debbie) was notified of pending arrival.

## 2016-05-02 NOTE — ED Notes (Signed)
Admitting MDs at bedside.

## 2016-05-02 NOTE — ED Notes (Signed)
ED Provider at bedside. 

## 2016-05-02 NOTE — H&P (Signed)
Date: 05/02/2016               Patient Name:  [name] MRN: [id number]  DOB: Sep 11, 1940 Age / Sex: 76 y.o., male   PCP: No Pcp Per Patient         Medical Service: Internal Medicine Teaching Service         Attending Physician: Dr. Lucious Groves, DO    First Contact: Dr. Holley Raring Pager: [phone]  Second Contact: Dr. Ignacia Marvel Pager: [phone]       After Hours (After 5p /  First Contact Pager: [phone]  Weekends / Holidays): Second Contact Pager: [phone]   Chief Complaint: Diplopia, dysarthria, facial droop, vertigo  History of Present Illness: [name] is a 76 y.o. male with a h/o of HTN and CAD s/p stent who presents with 1 day h/o acute onset horizontal diplopia.  Pt reports that he was out shoveling snow on 05/01/16 when he suddenly developed horizontal diplopia. His visual disturbance was severe and he was unable to walk d/t this, suffering a fall with minor injury to his hip. He was dysarthric with right facial droop. Pt has a h/o CAD with a stent placed several years ago and is currently taking ASA and statin with which he is compliant. He denies prior CVA.  Patient reports that currently he feels fine while lying in bed. He notes that if he were attempted to stand his legs feel weak and he becomes very dizzy. He also has significant disorientation with both eyes open due to his visual disturbance of the right eye. He initially reported dysarthria which has improved, he is now back at his baseline. He also reports that his right facial droop has slowly improved but there is still some minor deficit. Patient denies any chest pain, shortness of breath. He denies any recent constitutional symptoms. Pt was in his normal state of health prior to this event.  In the ED patient was evaluated with a CTA of the head and neck which was negative for any large vessel stenosis showing only mild atherosclerotic changes. While he was consulted and they recommended an MRI.  Given his deficits they feel it is most likely small vessel pontine infarct.  Meds: Current Outpatient Prescriptions  Medication Sig Dispense Refill  . aspirin 81 MG chewable tablet Chew 81 mg by mouth daily as needed (pain).     . carvedilol (COREG) 12.5 MG tablet Take 1 tablet (12.5 mg total) by mouth 2 (two) times daily. 180 tablet 3  . hydrochlorothiazide (HYDRODIURIL) 25 MG tablet Take 1 tablet (25 mg total) by mouth daily. 90 tablet 3  . simvastatin (ZOCOR) 20 MG tablet Take 1 tablet (20 mg total) by mouth at bedtime. 90 tablet 3  . valsartan (DIOVAN) 320 MG tablet Take 1 tablet (320 mg total) by mouth daily. 90 tablet 1   Allergies: Allergies as of 05/02/2016 - Review Complete 05/02/2016  Allergen Reaction Noted  . Lipitor [atorvastatin] Shortness Of Breath 07/21/2013  . Pork-derived products Other (See Comments) 04/17/2012   Past Medical History:  Diagnosis Date  . Coronary artery disease   . Hypertension    Family History: Pt family history includes Cancer in his sister; Heart disease in his mother.  Social History: Pt  reports that he has never smoked. He has never used smokeless tobacco. He reports that he does not drink alcohol or use drugs.  Review of Systems: A complete ROS was negative except as per HPI. Review  of Systems  Constitutional: Negative for chills, fever and weight loss.  Eyes: Positive for double vision. Negative for blurred vision, photophobia, pain, discharge and redness.  Respiratory: Negative for cough and shortness of breath.   Cardiovascular: Negative for chest pain and leg swelling.  Gastrointestinal: Negative for abdominal pain, constipation, diarrhea, nausea and vomiting.  Genitourinary: Negative for dysuria, frequency and urgency.  Musculoskeletal: Negative for myalgias.  Skin: Negative for rash.  Neurological: Positive for dizziness. Negative for tingling, tremors, sensory change, speech change, focal weakness, seizures, loss of  consciousness and headaches.  Endo/Heme/Allergies: Negative for polydipsia.  Psychiatric/Behavioral: The patient is not nervous/anxious.    Physical Exam: Vitals:   05/02/16 1215 05/02/16 1245 05/02/16 1315 05/02/16 1330  BP: 186/90 158/76 152/81 170/83  Pulse: (!) 57 (!) 52 (!) 58 64  Resp:  18 20 21   Temp:      TempSrc:      SpO2: 98% 96% 95% 95%  Weight:      Height:       Physical Exam  Constitutional: He is oriented to person, place, and time. He appears well-developed and well-nourished. He is cooperative. No distress.  HENT:  Head: Normocephalic and atraumatic.  Right Ear: Hearing normal.  Left Ear: Hearing normal.  Nose: Nose normal.  Mouth/Throat: Mucous membranes are normal.  Cardiovascular: Normal rate, regular rhythm, S1 normal, S2 normal and intact distal pulses.  Exam reveals no gallop.   No murmur heard. Pulmonary/Chest: Effort normal and breath sounds normal. No respiratory distress. He has no wheezes. He has no rhonchi. He has no rales. He exhibits no tenderness.  Abdominal: Soft. Normal appearance and bowel sounds are normal. He exhibits no ascites. There is no hepatosplenomegaly. There is no tenderness.  Neurological: He is alert and oriented to person, place, and time. He has normal strength. He is not disoriented. He displays no tremor. A cranial nerve deficit (very mild R facial droop (forehead preserved), Prominent medial rectus paralysis w/ full loss of medial gaze OD, full EOMI OS) is present. No sensory deficit. He exhibits normal muscle tone. Coordination normal.  Skin: Skin is warm, dry and intact. He is not diaphoretic.  Psychiatric: He has a normal mood and affect. His speech is normal and behavior is normal.   Labs: ABG:  Recent Labs Lab 05/02/16 1037  TCO2 26   CBC:  Recent Labs Lab 05/02/16 1012 05/02/16 1037  WBC 8.4  --   NEUTROABS 5.8  --   HGB 14.9 14.3  HCT 42.6 42.0  MCV 91.0  --   PLT 171  --    Basic Metabolic  Panel:  Recent Labs Lab 05/02/16 1012 05/02/16 1037  NA 137 140  K 3.7 3.6  CL 107 105  CO2 23  --   GLUCOSE 121* 119*  BUN 13 16  CREATININE 0.80 0.80  CALCIUM 9.2  --    Cardiac Enzymes:  Recent Labs Lab 05/02/16 1036  TROPIPOC 0.00   Coagulation Studies:  Recent Labs  05/02/16 1012  LABPROT 13.6  INR 1.04   Liver Function Tests:  Recent Labs Lab 05/02/16 1012  AST 19  ALT 18  ALKPHOS 50  BILITOT 0.9  PROT 6.6  ALBUMIN 3.7   Lipid Panel:    Component Value Date/Time   CHOL 134 01/20/2016 1047   TRIG 72 01/20/2016 1047   HDL 46 01/20/2016 1047   CHOLHDL 2.9 01/20/2016 1047   VLDL 14 01/20/2016 1047   LDLCALC 74 01/20/2016 1047  CBG: Lab Results  Component Value Date   HGBA1C 5.4 05/31/2014   Imaging: EKG Interpretation  Date/Time:  Wednesday May 02 2016 09:38:52 EDT Ventricular Rate:  62 PR Interval:    QRS Duration: 85 QT Interval:  426 QTC Calculation: 433 R Axis:   35 Text Interpretation: Sinus rhythm Abnormal R-wave progression, early transition No significant change since last tracing Confirmed by JACUBOWITZ  MD, SAM [phone]) on 05/02/2016 10:03:14 AM  Ct Angio Head W Or Wo Contrast Ct Head Wo Contrast Ct Angio Neck W And/or Wo Contrast Result Date: 05/02/2016 CLINICAL DATA:  Headache and double vision. Loss consciousness. Fell yesterday. Slurred speech. EXAM: CT HEAD WITHOUT CONTRAST CT ANGIOGRAPHY OF THE HEAD AND NECK TECHNIQUE: Contiguous axial images were obtained from the base of the skull through the vertex without intravenous contrast. Multidetector CT imaging of the head and neck was performed using the standard protocol during bolus administration of intravenous contrast. Multiplanar CT image reconstructions and MIPs were obtained to evaluate the vascular anatomy. Carotid stenosis measurements (when applicable) are obtained utilizing NASCET criteria, using the distal internal carotid diameter as the denominator. CONTRAST:  50 cc  Isovue 370 COMPARISON:  None. FINDINGS: CT HEAD Brain: Age related atrophy. Chronic small-vessel ischemic changes affecting the cerebral hemispheric deep and subcortical white matter. No sign of acute infarction, mass lesion, hemorrhage, hydrocephalus or extra-axial collection. Vascular: There is atherosclerotic calcification of the major vessels at the base of the brain. Skull: Negative Sinuses/Orbits: Mucosal inflammatory changes of the sinuses with retention cysts and/or polyps. Orbits negative. CTA NECK Aortic arch: Aortic atherosclerosis. No aneurysm or dissection. Branching pattern of the brachiocephalic vessels is normal. Right carotid system: Common carotid artery widely patent to the bifurcation. Soft plaque within the ICA bulb but no stenosis of the limb an beyond that of the more distal cervical ICA. No irregularity. Left carotid system: Common carotid artery widely patent to the bifurcation. Mild plaque at the carotid bifurcation and ICA bulb but no narrowing of the ICA laminae beyond that of the more distal cervical ICA and therefore no stenosis. Vertebral arteries:Left vertebral artery dominant. Both vertebral artery origins show mild calcification but without flow limiting stenosis. Beyond the origins, the vessels appear widely patent through the cervical region. Skeleton: Ordinary cervical spondylosis. Other neck: No significant soft tissue neck lesion. Lung apices clear. CTA HEAD Anterior circulation: Both internal carotid arteries patent through the skullbase and siphon regions. Peripheral atherosclerotic calcification in both carotid siphon regions with stenosis estimated at 30-50% bilaterally. Anterior and middle cerebral vessels are patent without proximal stenosis, aneurysm or vascular malformation. No missing branch vessels identified. Posterior circulation: Both vertebral arteries patent through the foramen magnum with the left being dominant. Both vertebral arteries supply the basilar. No  basilar stenosis. Posterior circulation branch vessels are patent without stenosis. No posterior circulation aneurysm. Venous sinuses: Patent and normal. Anatomic variants: None significant Delayed phase: No abnormal enhancement IMPRESSION: No acute finding. No evidence of acute stroke. No evidence of aneurysm or hemorrhage. Chronic small-vessel ischemic changes of the cerebral hemispheric white matter. Atherosclerotic change at both carotid bifurcations and both vertebral artery origins but without measurable stenosis of the carotids. Mild narrowing of the proximal vertebral arteries, not flow limiting. Atherosclerotic disease in both carotid siphon regions with narrowing estimated at 30-50% bilaterally. Electronically Signed   By: Nelson Chimes M.D.   On: 05/02/2016 11:45   Assessment & Plan by Problem: Active Problems:   Cerebral thrombosis with cerebral infarction   Ischemic stroke (  [facility])  [name] is a 76 y.o. male with h/o CAD who presents with medial gaze palsy OD, facial droop OD, vertigo thought 2/2 left brainstem infarct.  1) Left brainstem stroke: Primary deficit of medial gaze palsy OD, with mild resolving R facial droop and vertigo. Persistent deficits >24hrs. CTA negative for large vessel disease. Risk include known ASCVD and HTN. No h/o tobacco/drug use or DM. Lipids from 01/20/2016 were well controlled. Neurology c/s, appreciate recs. - admit to telemetry - MRI brain - PT/OT, vestibular rehab - ASA 325mg  qD - continue home simvastatin - check A1c - consider echo/carotids  2) HTN: BP elevated today to 150-180s/80-90s. Allow permissive HTN BP goal <220/120. Home meds: Coreg 12.5mg  BID, HCTZ 25mg  qD, valsartan 320mg  qD. - hold home meds - PRN hydralazine for BP goal above  3) CAD: h/o stenting several years ago. Follows with Dr. Burt Knack. On ASA and simvastatin 20mg  qD (did not tolerate atorva 2/2 SOB) at home + BP control. No c/o CP or SOB.  DVT PPx - low molecular weight  heparin  Code Status - Full  Consults Placed - Neurology  Dispo: Admit patient to Inpatient with expected length of stay greater than 2 midnights.  Signed: Holley Raring, MD 05/02/2016, 1:49 PM  Pager: [phone]

## 2016-05-02 NOTE — Consult Note (Signed)
Requesting Physician: ED MD    Chief Complaint: diplopia  History obtained from:  Patient     HPI:                                                                                                                                         [name] is an 76 y.o. male was outside yesterday shoveling his driveway when he suddenly noted that he was seeing horizontal double vision. It was to the point that when he tried walking actually fell to the side onto the grass and hurt his right hip. At that time he was noted to have some slurred speech and facial droop. His slurred speech has cleared somewhat but is facial droop on the right is still present. Currently patient is closing his left eye in order to see correctly. It is obvious when looking at him as he is looking around the room that his right eye does not deviate medially past midline. His left eye has full range of motion. Neurology was called for diplopia. Patient does take aspirin on a daily basis.  Date last known well: Date: 05/01/2016 Time last known well: Time: 12:00 tPA Given: No: out of window   Past Medical History:  Diagnosis Date  . Coronary artery disease   . Hypertension     Past Surgical History:  Procedure Laterality Date  . CORONARY STENT PLACEMENT      Family History  Problem Relation Age of Onset  . Heart disease Mother   . Cancer Sister    Social History:  reports that he has never smoked. He has never used smokeless tobacco. He reports that he does not drink alcohol or use drugs.  Allergies:  Allergies  Allergen Reactions  . Lipitor [Atorvastatin] Shortness Of Breath  . Pork-Derived Products Other (See Comments)    No pork products - unspecified    Medications:                                                                                                                           No current facility-administered medications for this encounter.    Current Outpatient Prescriptions  Medication Sig  Dispense Refill  . aspirin 81 MG chewable tablet Chew 81 mg by mouth daily as needed (pain).     . carvedilol (COREG) 12.5 MG tablet Take 1 tablet (12.5 mg  total) by mouth 2 (two) times daily. 180 tablet 3  . hydrochlorothiazide (HYDRODIURIL) 25 MG tablet Take 1 tablet (25 mg total) by mouth daily. 90 tablet 3  . simvastatin (ZOCOR) 20 MG tablet Take 1 tablet (20 mg total) by mouth at bedtime. 90 tablet 3  . valsartan (DIOVAN) 320 MG tablet Take 1 tablet (320 mg total) by mouth daily. 90 tablet 1     ROS:                                                                                                                                       History obtained from the patient  General ROS: negative for - chills, fatigue, fever, night sweats, weight gain or weight loss Psychological ROS: negative for - behavioral disorder, hallucinations, memory difficulties, mood swings or suicidal ideation Ophthalmic ROS: Positive for - blurry vision, double vision, ENT ROS: negative for - epistaxis, nasal discharge, oral lesions, sore throat, tinnitus or vertigo Allergy and Immunology ROS: negative for - hives or itchy/watery eyes Hematological and Lymphatic ROS: negative for - bleeding problems, bruising or swollen lymph nodes Endocrine ROS: negative for - galactorrhea, hair pattern changes, polydipsia/polyuria or temperature intolerance Respiratory ROS: negative for - cough, hemoptysis, shortness of breath or wheezing Cardiovascular ROS: negative for - chest pain, dyspnea on exertion, edema or irregular heartbeat Gastrointestinal ROS: negative for - abdominal pain, diarrhea, hematemesis, nausea/vomiting or stool incontinence Genito-Urinary ROS: negative for - dysuria, hematuria, incontinence or urinary frequency/urgency Musculoskeletal ROS: negative for - joint swelling or muscular weakness Neurological ROS: as noted in HPI Dermatological ROS: negative for rash and skin lesion changes  Neurologic  Examination:                                                                                                      Blood pressure 164/93, pulse (!) 57, temperature 97.9 F (36.6 C), temperature source Oral, resp. rate 21, height 5\' 7"  (1.702 m), weight 184 lb (83.5 kg), SpO2 94 %.  HEENT-  Normocephalic, no lesions, without obvious abnormality.  Normal external eye and conjunctiva.  Normal TM's bilaterally.  Normal auditory canals and external ears. Normal external nose, mucus membranes and septum.  Normal pharynx. Cardiovascular- S1, S2 normal, pulses palpable throughout   Lungs- chest clear, no wheezing, rales, normal symmetric air entry Abdomen- normal findings: bowel sounds normal Extremities- no edema Lymph-no adenopathy palpable Musculoskeletal-no joint tenderness, deformity or swelling Skin-warm and dry, no hyperpigmentation, vitiligo, or suspicious lesions  Neurological Examination  Mental Status: Alert, oriented, thought content appropriate.  Speech fluent without evidence of aphasia.  Able to follow 3 step commands without difficulty. Cranial Nerves: II: If each eye is covered vision is with intact throughout. However if both eyes are open patient has difficulty with counting as he has significant diplopia  III,IV, VI: ptosis not present, right eyes shows intact vertical gaze and lateral gaze but does have not medially move past midline left eye has full range of motion, pupils equal, round, reactive to light and accommodation V,VII: smile mild asymmetric smile on the right, facial light touch sensation normal bilaterally VIII: hearing normal bilaterally IX,X: uvula rises greater on the left than the right XI: bilateral shoulder shrug XII: midline tongue extension Motor: Right : Upper extremity   5/5    Left:     Upper extremity   5/5  Lower extremity   5/5     Lower extremity   5/5 Tone and bulk:normal tone throughout; no atrophy noted Sensory: Pinprick and light touch intact  throughout, bilaterally Deep Tendon Reflexes: 2+ and symmetric throughout Plantars: Right: downgoing   Left: downgoing Cerebellar: normal finger-to-nose when eyes are closed however when eyes are open he has some difficulty secondary to visual alterations. He does better with one eye covered. and normal heel-to-shin test GaiNot tested due to safety    Lab Results: Basic Metabolic Panel:  Recent Labs Lab 05/02/16 1012 05/02/16 1037  NA 137 140  K 3.7 3.6  CL 107 105  CO2 23  --   GLUCOSE 121* 119*  BUN 13 16  CREATININE 0.80 0.80  CALCIUM 9.2  --     Liver Function Tests:  Recent Labs Lab 05/02/16 1012  AST 19  ALT 18  ALKPHOS 50  BILITOT 0.9  PROT 6.6  ALBUMIN 3.7   No results for input(s): LIPASE, AMYLASE in the last 168 hours. No results for input(s): AMMONIA in the last 168 hours.  CBC:  Recent Labs Lab 05/02/16 1012 05/02/16 1037  WBC 8.4  --   NEUTROABS 5.8  --   HGB 14.9 14.3  HCT 42.6 42.0  MCV 91.0  --   PLT 171  --     Cardiac Enzymes: No results for input(s): CKTOTAL, CKMB, CKMBINDEX, TROPONINI in the last 168 hours.  Lipid Panel: No results for input(s): CHOL, TRIG, HDL, CHOLHDL, VLDL, LDLCALC in the last 168 hours.  CBG: No results for input(s): GLUCAP in the last 168 hours.  Microbiology: Results for orders placed or performed during the hospital encounter of 04/16/12  MRSA PCR Screening     Status: None   Collection Time: 04/17/12 12:52 AM  Result Value Ref Range Status   MRSA by PCR NEGATIVE NEGATIVE Final    Comment:        The GeneXpert MRSA Assay (FDA approved for NASAL specimens only), is one component of a comprehensive MRSA colonization surveillance program. It is not intended to diagnose MRSA infection nor to guide or monitor treatment for MRSA infections.    Coagulation Studies:  Recent Labs  05/02/16 1012  LABPROT 13.6  INR 1.04    Imaging: Ct Angio Head W Or Wo Contrast  Result Date:  05/02/2016 CLINICAL DATA:  Headache and double vision. Loss consciousness. Fell yesterday. Slurred speech. EXAM: CT HEAD WITHOUT CONTRAST CT ANGIOGRAPHY OF THE HEAD AND NECK TECHNIQUE: Contiguous axial images were obtained from the base of the skull through the vertex without intravenous contrast. Multidetector CT imaging of the head and  neck was performed using the standard protocol during bolus administration of intravenous contrast. Multiplanar CT image reconstructions and MIPs were obtained to evaluate the vascular anatomy. Carotid stenosis measurements (when applicable) are obtained utilizing NASCET criteria, using the distal internal carotid diameter as the denominator. CONTRAST:  50 cc Isovue 370 COMPARISON:  None. FINDINGS: CT HEAD Brain: Age related atrophy. Chronic small-vessel ischemic changes affecting the cerebral hemispheric deep and subcortical white matter. No sign of acute infarction, mass lesion, hemorrhage, hydrocephalus or extra-axial collection. Vascular: There is atherosclerotic calcification of the major vessels at the base of the brain. Skull: Negative Sinuses/Orbits: Mucosal inflammatory changes of the sinuses with retention cysts and/or polyps. Orbits negative. CTA NECK Aortic arch: Aortic atherosclerosis. No aneurysm or dissection. Branching pattern of the brachiocephalic vessels is normal. Right carotid system: Common carotid artery widely patent to the bifurcation. Soft plaque within the ICA bulb but no stenosis of the limb an beyond that of the more distal cervical ICA. No irregularity. Left carotid system: Common carotid artery widely patent to the bifurcation. Mild plaque at the carotid bifurcation and ICA bulb but no narrowing of the ICA laminae beyond that of the more distal cervical ICA and therefore no stenosis. Vertebral arteries:Left vertebral artery dominant. Both vertebral artery origins show mild calcification but without flow limiting stenosis. Beyond the origins, the  vessels appear widely patent through the cervical region. Skeleton: Ordinary cervical spondylosis. Other neck: No significant soft tissue neck lesion. Lung apices clear. CTA HEAD Anterior circulation: Both internal carotid arteries patent through the skullbase and siphon regions. Peripheral atherosclerotic calcification in both carotid siphon regions with stenosis estimated at 30-50% bilaterally. Anterior and middle cerebral vessels are patent without proximal stenosis, aneurysm or vascular malformation. No missing branch vessels identified. Posterior circulation: Both vertebral arteries patent through the foramen magnum with the left being dominant. Both vertebral arteries supply the basilar. No basilar stenosis. Posterior circulation branch vessels are patent without stenosis. No posterior circulation aneurysm. Venous sinuses: Patent and normal. Anatomic variants: None significant Delayed phase: No abnormal enhancement IMPRESSION: No acute finding. No evidence of acute stroke. No evidence of aneurysm or hemorrhage. Chronic small-vessel ischemic changes of the cerebral hemispheric white matter. Atherosclerotic change at both carotid bifurcations and both vertebral artery origins but without measurable stenosis of the carotids. Mild narrowing of the proximal vertebral arteries, not flow limiting. Atherosclerotic disease in both carotid siphon regions with narrowing estimated at 30-50% bilaterally. Electronically Signed   By: Nelson Chimes M.D.   On: 05/02/2016 11:45   Ct Head Wo Contrast  Result Date: 05/02/2016 CLINICAL DATA:  Headache and double vision. Loss consciousness. Fell yesterday. Slurred speech. EXAM: CT HEAD WITHOUT CONTRAST CT ANGIOGRAPHY OF THE HEAD AND NECK TECHNIQUE: Contiguous axial images were obtained from the base of the skull through the vertex without intravenous contrast. Multidetector CT imaging of the head and neck was performed using the standard protocol during bolus administration of  intravenous contrast. Multiplanar CT image reconstructions and MIPs were obtained to evaluate the vascular anatomy. Carotid stenosis measurements (when applicable) are obtained utilizing NASCET criteria, using the distal internal carotid diameter as the denominator. CONTRAST:  50 cc Isovue 370 COMPARISON:  None. FINDINGS: CT HEAD Brain: Age related atrophy. Chronic small-vessel ischemic changes affecting the cerebral hemispheric deep and subcortical white matter. No sign of acute infarction, mass lesion, hemorrhage, hydrocephalus or extra-axial collection. Vascular: There is atherosclerotic calcification of the major vessels at the base of the brain. Skull: Negative Sinuses/Orbits: Mucosal inflammatory changes of  the sinuses with retention cysts and/or polyps. Orbits negative. CTA NECK Aortic arch: Aortic atherosclerosis. No aneurysm or dissection. Branching pattern of the brachiocephalic vessels is normal. Right carotid system: Common carotid artery widely patent to the bifurcation. Soft plaque within the ICA bulb but no stenosis of the limb an beyond that of the more distal cervical ICA. No irregularity. Left carotid system: Common carotid artery widely patent to the bifurcation. Mild plaque at the carotid bifurcation and ICA bulb but no narrowing of the ICA laminae beyond that of the more distal cervical ICA and therefore no stenosis. Vertebral arteries:Left vertebral artery dominant. Both vertebral artery origins show mild calcification but without flow limiting stenosis. Beyond the origins, the vessels appear widely patent through the cervical region. Skeleton: Ordinary cervical spondylosis. Other neck: No significant soft tissue neck lesion. Lung apices clear. CTA HEAD Anterior circulation: Both internal carotid arteries patent through the skullbase and siphon regions. Peripheral atherosclerotic calcification in both carotid siphon regions with stenosis estimated at 30-50% bilaterally. Anterior and middle  cerebral vessels are patent without proximal stenosis, aneurysm or vascular malformation. No missing branch vessels identified. Posterior circulation: Both vertebral arteries patent through the foramen magnum with the left being dominant. Both vertebral arteries supply the basilar. No basilar stenosis. Posterior circulation branch vessels are patent without stenosis. No posterior circulation aneurysm. Venous sinuses: Patent and normal. Anatomic variants: None significant Delayed phase: No abnormal enhancement IMPRESSION: No acute finding. No evidence of acute stroke. No evidence of aneurysm or hemorrhage. Chronic small-vessel ischemic changes of the cerebral hemispheric white matter. Atherosclerotic change at both carotid bifurcations and both vertebral artery origins but without measurable stenosis of the carotids. Mild narrowing of the proximal vertebral arteries, not flow limiting. Atherosclerotic disease in both carotid siphon regions with narrowing estimated at 30-50% bilaterally. Electronically Signed   By: Nelson Chimes M.D.   On: 05/02/2016 11:45   Ct Angio Neck W And/or Wo Contrast  Result Date: 05/02/2016 CLINICAL DATA:  Headache and double vision. Loss consciousness. Fell yesterday. Slurred speech. EXAM: CT HEAD WITHOUT CONTRAST CT ANGIOGRAPHY OF THE HEAD AND NECK TECHNIQUE: Contiguous axial images were obtained from the base of the skull through the vertex without intravenous contrast. Multidetector CT imaging of the head and neck was performed using the standard protocol during bolus administration of intravenous contrast. Multiplanar CT image reconstructions and MIPs were obtained to evaluate the vascular anatomy. Carotid stenosis measurements (when applicable) are obtained utilizing NASCET criteria, using the distal internal carotid diameter as the denominator. CONTRAST:  50 cc Isovue 370 COMPARISON:  None. FINDINGS: CT HEAD Brain: Age related atrophy. Chronic small-vessel ischemic changes  affecting the cerebral hemispheric deep and subcortical white matter. No sign of acute infarction, mass lesion, hemorrhage, hydrocephalus or extra-axial collection. Vascular: There is atherosclerotic calcification of the major vessels at the base of the brain. Skull: Negative Sinuses/Orbits: Mucosal inflammatory changes of the sinuses with retention cysts and/or polyps. Orbits negative. CTA NECK Aortic arch: Aortic atherosclerosis. No aneurysm or dissection. Branching pattern of the brachiocephalic vessels is normal. Right carotid system: Common carotid artery widely patent to the bifurcation. Soft plaque within the ICA bulb but no stenosis of the limb an beyond that of the more distal cervical ICA. No irregularity. Left carotid system: Common carotid artery widely patent to the bifurcation. Mild plaque at the carotid bifurcation and ICA bulb but no narrowing of the ICA laminae beyond that of the more distal cervical ICA and therefore no stenosis. Vertebral arteries:Left vertebral artery dominant.  Both vertebral artery origins show mild calcification but without flow limiting stenosis. Beyond the origins, the vessels appear widely patent through the cervical region. Skeleton: Ordinary cervical spondylosis. Other neck: No significant soft tissue neck lesion. Lung apices clear. CTA HEAD Anterior circulation: Both internal carotid arteries patent through the skullbase and siphon regions. Peripheral atherosclerotic calcification in both carotid siphon regions with stenosis estimated at 30-50% bilaterally. Anterior and middle cerebral vessels are patent without proximal stenosis, aneurysm or vascular malformation. No missing branch vessels identified. Posterior circulation: Both vertebral arteries patent through the foramen magnum with the left being dominant. Both vertebral arteries supply the basilar. No basilar stenosis. Posterior circulation branch vessels are patent without stenosis. No posterior circulation  aneurysm. Venous sinuses: Patent and normal. Anatomic variants: None significant Delayed phase: No abnormal enhancement IMPRESSION: No acute finding. No evidence of acute stroke. No evidence of aneurysm or hemorrhage. Chronic small-vessel ischemic changes of the cerebral hemispheric white matter. Atherosclerotic change at both carotid bifurcations and both vertebral artery origins but without measurable stenosis of the carotids. Mild narrowing of the proximal vertebral arteries, not flow limiting. Atherosclerotic disease in both carotid siphon regions with narrowing estimated at 30-50% bilaterally. Electronically Signed   By: Nelson Chimes M.D.   On: 05/02/2016 11:45       Assessment and plan discussed with with attending physician and they are in agreement.    Etta Quill PA-C [facility] [phone]  05/02/2016, 12:25 PM   Assessment: 77 y.o. male 's ending to the hospital with horizontal diplopia and physical exam showing a right facial droop, one half eye and no on the right eye, decreased elevation of the uvula on the right side. CTA shows no large vessel narrowing or occlusion. Patient likely has suffered a mid brain/pontine infarct which is likely small vessel.   Stroke Risk Factors - hypertension   recommendations : 1. HgbA1c, fasting lipid panel 2. MRI of the brain without contrast 3. PT consult, OT consult, Speech consult 4. Echocardiogram 6. Prophylactic therapy-Antiplatelet med: Aspirin - dose 325 mg daily 7. Risk factor modification 8. Telemetry monitoring 9. Frequent neuro checks 10 NPO until passes stroke swallow screen 11 Lovenox for DVT prophylaxis 12 please page stroke NP  Or  PA  Or MD from 8am -4 pm  as this patient from this time will be  followed by the stroke.   You can look them up on [email or web address]  Password [email or web address]  Addendum: I have seen and examined pt and agree with above plan.  Briefly he developed vertigo, diplopia and LE weakness yesterday and  waited to come to the ED until today.  By the time he reached here he was not a tPA candidate.  Exam shows a right INO.  He will need a stroke w/u.  Pt on asa at baseline (81mg ).  Will advance to 325.  Lovenox for DVT prophylaxis.  CTA reviewed and no LVO.  May beging treating BP in am.  Pt will be followed by stroke team after tomorrow.  Sallyanne Havers MD

## 2016-05-02 NOTE — Telephone Encounter (Signed)
Triage called to come speak with the patient. Pt came in for appointment today in the HTN clinic with his wife and his son. The wife states that they were under the impression that they were seeing Dr. Burt Knack today. The wife states that the patient fell yesterday and has had slurred speech and arm numbness before he fell. 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 They did not take him to the emergency room yesterday as they believed he was going to see Dr. Burt Knack today. Tana [place], [facility] and I advised that those symptoms are a medical emergency and the patient needs to be evaluated immediately in the ER. The patient was assisted to the car. The patient was clearly visually impaired and unsteady on his feet. The ER was called and notified that they were on the way.

## 2016-05-02 NOTE — ED Notes (Signed)
Attempted to call report

## 2016-05-03 ENCOUNTER — Inpatient Hospital Stay (HOSPITAL_COMMUNITY): Payer: Medicare HMO

## 2016-05-03 DIAGNOSIS — Z79899 Other long term (current) drug therapy: Secondary | ICD-10-CM

## 2016-05-03 DIAGNOSIS — I639 Cerebral infarction, unspecified: Principal | ICD-10-CM

## 2016-05-03 DIAGNOSIS — I119 Hypertensive heart disease without heart failure: Secondary | ICD-10-CM

## 2016-05-03 DIAGNOSIS — H532 Diplopia: Secondary | ICD-10-CM

## 2016-05-03 DIAGNOSIS — Z809 Family history of malignant neoplasm, unspecified: Secondary | ICD-10-CM

## 2016-05-03 DIAGNOSIS — Z8249 Family history of ischemic heart disease and other diseases of the circulatory system: Secondary | ICD-10-CM

## 2016-05-03 DIAGNOSIS — I2510 Atherosclerotic heart disease of native coronary artery without angina pectoris: Secondary | ICD-10-CM

## 2016-05-03 DIAGNOSIS — Z7982 Long term (current) use of aspirin: Secondary | ICD-10-CM

## 2016-05-03 DIAGNOSIS — I6789 Other cerebrovascular disease: Secondary | ICD-10-CM

## 2016-05-03 DIAGNOSIS — E785 Hyperlipidemia, unspecified: Secondary | ICD-10-CM

## 2016-05-03 DIAGNOSIS — Z955 Presence of coronary angioplasty implant and graft: Secondary | ICD-10-CM

## 2016-05-03 LAB — HEMOGLOBIN A1C
HEMOGLOBIN A1C: 5.7 % — AB (ref 4.8–5.6)
Mean Plasma Glucose: 117 mg/dL

## 2016-05-03 LAB — ECHOCARDIOGRAM COMPLETE
HEIGHTINCHES: 67 in
WEIGHTICAEL: 2944 [oz_av]

## 2016-05-03 MED ORDER — ROSUVASTATIN CALCIUM 20 MG PO TABS
20.0000 mg | ORAL_TABLET | Freq: Every day | ORAL | Status: DC
Start: 2016-05-03 — End: 2016-05-04
  Administered 2016-05-03: 20 mg via ORAL
  Filled 2016-05-03: qty 1

## 2016-05-03 MED ORDER — CLOPIDOGREL BISULFATE 75 MG PO TABS
75.0000 mg | ORAL_TABLET | Freq: Every day | ORAL | Status: DC
  Administered 2016-05-03 – 2016-05-04 (×2): 75 mg via ORAL
  Filled 2016-05-03 (×2): qty 1

## 2016-05-03 MED ORDER — STROKE: EARLY STAGES OF RECOVERY BOOK
Freq: Once | Status: AC
  Administered 2016-05-03: 15:00:00

## 2016-05-03 NOTE — H&P (Signed)
Internal Medicine Attending Admission Note  I saw and evaluated the patient. I reviewed the resident's note and I agree with the resident's findings and plan as documented in the resident's note.  Assessment & Plan by Problem:   Ischemic stroke ([place]) - MRI is revealing on chronic microvascular ischemic disease with multiple chronic infarcts as well as an acute infarct in the dorsal aspect of the pons, will defer to Neurology if this is the infarct that is causing his symptoms however given the multiple infarct he may have also had tiny infarct elsewhere in the brainstem.  We will continue stroke workup including therapy.  Currently treated with ASA 325, will likely need second agent.   - He was previously only on moderate intensity simvastatin 20mg  due to reaction of "SOB" to atorvastatin.  He would likely benefit from a high intensity statin, he has a good relationship with Dr Burt Knack his cardiologist. I do not see that we have obtained a lipid panel and this may be beneficial.  His Last LDL was very near our goal (74).  We may consider trial of Rosuvastatin, but will certainly touch base with Dr Burt Knack per Mr Schildt request.    Hypertensive heart disease - Hold home meds for permissive HTN.   Chief Complaint(s):diplopia  History - key components related to admission: [name] is a 76 yo male with history of HTN and CAD that presented for acute onset of diplopia.  He was shoveling snow when he suddenly developed horizontal diplopia when he looked to the left.  He does take 81mg  of Aspirin daily as well as 20mg  of simvastatin for his CAD.  He is not aware of any previous CVA.  He presented to the ED outside of the tPA window. CTA revealed now LVO.  He was evaluated by neurology in the ED who recommended IM admission with CVA workup.  Of note: Polycythemia Vanita Ingles was noted on his EMR.  In review on his record, he was admitted in February of 2014 for syncope due to hypertensive crisis, his  initial CBC noted a Hgb of 17.3, however this normalized on repeat the following day.  It does not appear that any workup for polycythemia was pursued and discharge summary notes this was a single elevated Hgb.  Review of his CBC since that time shows that all are normal.  HE DOES NOT have polycythemia vera and this must have been mistakenly added during that admission.  I will now remove it from his chart to avoid further confusion.  Lab results: Reviewed in Epic  Physical Exam - key components related to admission: General: resting in bed HEENT: PERRL, no scleral icterus Cardiac: RRR, no rubs, murmurs or gallops Pulm: clear to auscultation bilaterally, moving normal volumes of air Abd: soft, nontender, nondistended, BS present Ext: warm and well perfused, no pedal edema Neuro: alert and oriented X3, gross muscle strength intact in all 4 extremities, CN exam notable for mild loss or right nasal labal fold, unable to abduct Right eye across midline, otherwise CN4-12 grossly intact  Vitals:   05/02/16 2124 05/03/16 0140 05/03/16 0632 05/03/16 0921  BP: (!) 147/74 129/80 140/80 (!) 161/67  Pulse: 66 64 (!) 58 69  Resp: 18 20 20    Temp: 97.7 F (36.5 C) 98.4 F (36.9 C) 97.7 F (36.5 C) 97.7 F (36.5 C)  TempSrc: Oral Oral Oral Oral  SpO2: 96% 97% 97% 92%  Weight:      Height:

## 2016-05-03 NOTE — Progress Notes (Signed)
SLP Cancellation Note  Patient Details Name: [name] MRN: [id number] DOB: 04-Jul-1940   Cancelled treatment:       Reason Eval/Treat Not Completed: Patient at procedure or test/unavailable   Juan Quam Laurice 05/03/2016, 3:53 PM

## 2016-05-03 NOTE — Progress Notes (Signed)
Occupational Therapy Evaluation Patient Details Name: [name] MRN: [id number] DOB: [birth year] Today's Date: 05/03/2016    History of Present Illness Pt is a 76 y.o. male with a h/o of HTN and CAD s/p stent who presented to the ER on 05/02/16 with 1 day h/o acute onset horizontal diplopia when shoveling snow that resulted in a fall. MRI revealed abnormality within the dorsal aspect of the pons, suspicious for possible tiny acute small vessel type infarct. Moderate chronic microvascular ischemic disease with multiple scatter remote lacunar infarcts involving the bilateral thalami, pons, and cerebellum.   Clinical Impression   Received verbal order from Dr. Erlinda Foster to use partial occlusion with pt to reduce symptoms of diplopia to increase functional mobility and independence with ADL. Pt is able to use binocular vision and has increased depth perception using this technique rather than complete occlusion (patching). Pt able to ambulate to bathroom @ RW level with S using partial occlusion glasses. Encourage nursing to use glasses with pt when ambulating to bathroom with RW. Will follow up tomorrow to further assess. Continue to recommend neuro outpt OT to maximize functional level of independence and facilitate return to PLOF and assess further needs.      Follow Up Recommendations  Outpatient OT;Supervision/Assistance - 24 hour (neuro outpt)    Equipment Recommendations  3 in 1 bedside commode;Other (comment)    Recommendations for Other Services       Precautions / Restrictions Precautions Precautions: Fall      Mobility Bed Mobility Overal bed mobility: Modified Independent                Transfers Overall transfer level: Needs assistance Equipment used: Rolling walker (2 wheeled) Transfers: Sit to/from [facility] Sit to Stand: Supervision Stand pivot transfers: Supervision            Balance                                             ADL                           Toilet Transfer: Supervision/safety;BSC;Ambulation           Functional mobility during ADLs: Supervision/safety;Rolling walker;Cueing for safety General ADL Comments: Pt ambulated with S @ RW level. Pt states he feels better with using glasses.      Vision  Lateral aspect of L eye occluded to reduce symptoms of horizontal diplopia during L gaze. Pt verbalizes that occlusion helps to minimize double vision. Pt encouraged to wear glasses as tolerated, especially during functional tasks. Pt educated to wear glasses whenever mobilizing using RW. Nursing asked to ambulate pt using glasses, RW and gait belt with minguard A for safety.  Family not present this pm but will need education. Will benefit from education on compensatory strategies for low vision.          depth perception imparied due to visual deficits         Pertinent Vitals/Pain Pain Assessment: No/denies pain                       Communication     Cognition Arousal/Alertness: Awake/alert Behavior During Therapy: WFL for tasks assessed/performed Overall Cognitive Status: Within Functional Limits for tasks assessed  General Comments       Exercises       Shoulder Instructions      Home Living                                          Prior Functioning/Environment                   OT Problem List:        OT Treatment/Interventions:      OT Goals(Current goals can be found in the care plan section) Acute Rehab OT Goals Patient Stated Goal: to see normal again OT Goal Formulation: With patient Time For Goal Achievement: 05/17/16 Potential to Achieve Goals: Good ADL Goals Pt Will Perform Grooming: with supervision;standing Pt Will Transfer to Toilet: with supervision;ambulating;regular height toilet Pt Will Perform Toileting - Clothing Manipulation and hygiene: with modified independence;sit  to/from stand Additional ADL Goal #1: Pt will verbalize importance of partial occlusion for safety during functional transfers and ADL.  OT Frequency: Min 3X/week   Barriers to D/C:            Co-evaluation              End of Session Equipment Utilized During Treatment: Gait belt;Rolling walker Nurse Communication: Mobility status;Other (comment) (encourage ambulation with RW and use of glasses @ minguard )  Activity Tolerance: Patient tolerated treatment well Patient left: Other (comment) (with PT)  OT Visit Diagnosis: Unsteadiness on feet (R26.81);Other symptoms and signs involving the nervous system (R29.898);Other (comment) (impaired vision)                ADL either performed or assessed with clinical judgement  Time: 3220-2542 OT Time Calculation (min): 17 min Charges:  OT General Charges $OT Visit: 1 Procedure OT Treatments $Therapeutic Activity: 8-22 mins G-Codes:     [name], OT/L  [phone] 05/03/2016  [name],[name] 05/03/2016, 2:15 PM

## 2016-05-03 NOTE — Progress Notes (Signed)
STROKE TEAM PROGRESS NOTE   HISTORY OF PRESENT ILLNESS (per record) [name] is an 76 y.o. male who was outside yesterday shoveling his driveway when he suddenly noted that he was seeing horizontal double vision. It was to the point that when he tried walking he actually fell to the side onto the grass and hurt his right hip. At that time he was noted to have some slurred speech and facial droop. His slurred speech has cleared somewhat but is facial droop on the right is still present. Currently patient is closing his left eye in order to see correctly. It is obvious when looking at him as he is looking around the room that his right eye does not deviate medially past midline. His left eye has full range of motion. Neurology was called for diplopia. Patient does take aspirin on a daily basis. He was last known well 05/01/2016 at 12:00. Patient was not administered IV t-PA secondary to delay in arrival. He was admitted  for further evaluation and treatment.   SUBJECTIVE (INTERVAL HISTORY) Wife and daughter at bedside. Pt still has right INO and diplopia. OT recommended partial occluder. Pt also has mild right facial droop and dysarthria.    OBJECTIVE Temp:  [97.7 F (36.5 C)-98.4 F (36.9 C)] 97.7 F (36.5 C) (03/15 0921) Pulse Rate:  [51-69] 69 (03/15 0921) Cardiac Rhythm: Sinus bradycardia (03/14 1900) Resp:  [18-25] 20 (03/15 0632) BP: (129-186)/(67-95) 161/67 (03/15 0921) SpO2:  [92 %-100 %] 92 % (03/15 0921)  CBC:  Recent Labs Lab 05/02/16 1012 05/02/16 1037  WBC 8.4  --   NEUTROABS 5.8  --   HGB 14.9 14.3  HCT 42.6 42.0  MCV 91.0  --   PLT 171  --     Basic Metabolic Panel:  Recent Labs Lab 05/02/16 0855 05/02/16 1012 05/02/16 1037  NA 141 137 140  K 4.1 3.7 3.6  CL 102 107 105  CO2 21 23  --   GLUCOSE 122* 121* 119*  BUN 14 13 16   CREATININE 0.86 0.80 0.80  CALCIUM 9.3 9.2  --     Lipid Panel:    Component Value Date/Time   CHOL 134 01/20/2016 1047    TRIG 72 01/20/2016 1047   HDL 46 01/20/2016 1047   CHOLHDL 2.9 01/20/2016 1047   VLDL 14 01/20/2016 1047   LDLCALC 74 01/20/2016 1047   HgbA1c:  Lab Results  Component Value Date   HGBA1C 5.7 (H) 05/02/2016   Urine Drug Screen: No results found for: LABOPIA, COCAINSCRNUR, LABBENZ, AMPHETMU, THCU, LABBARB    IMAGING I have personally reviewed the radiological images below and agree with the radiology interpretations.  Ct Head Wo Contrast 05/02/2016 No acute finding. No evidence of acute stroke. No evidence of aneurysm or hemorrhage. Chronic small-vessel ischemic changes of the cerebral hemispheric white matter. Atherosclerotic change at both carotid bifurcations and both vertebral artery origins but without measurable stenosis of the carotids. Mild narrowing of the proximal vertebral arteries, not flow limiting. Atherosclerotic disease in both carotid siphon regions with narrowing estimated at 30-50% bilaterally.   Ct Angio Head W Or Wo Contrast Ct Angio Neck W And/or Wo Contrast 05/02/2016 No acute finding. No evidence of acute stroke. No evidence of aneurysm or hemorrhage. Chronic small-vessel ischemic changes of the cerebral hemispheric white matter. Atherosclerotic change at both carotid bifurcations and both vertebral artery origins but without measurable stenosis of the carotids. Mild narrowing of the proximal vertebral arteries, not flow limiting. Atherosclerotic disease in  both carotid siphon regions with narrowing estimated at 30-50% bilaterally.   Mr Brain Wo Contrast 05/02/2016 1. Punctate 4 mm focus of diffusion abnormality within the dorsal aspect of the pons, suspicious for possible tiny acute small vessel type infarct. 2. Moderate chronic microvascular ischemic disease with multiple scatter remote lacunar infarcts involving the bilateral thalami, pons, and cerebellum. 3. Multiple scattered chronic micro hemorrhages as above, most likely due to chronic underlying hypertension.    TTE - Left ventricle: The cavity size was normal. There was moderate   concentric hypertrophy. Systolic function was normal. The   estimated ejection fraction was in the range of 60% to 65%. Wall   motion was normal; there were no regional wall motion   abnormalities. There was an increased relative contribution of   atrial contraction to ventricular filling. Doppler parameters are   consistent with abnormal left ventricular relaxation (grade 1   diastolic dysfunction). - Aortic valve: Trileaflet; mildly thickened leaflets. There was   mild regurgitation. - Aorta: Aortic root dimension: 40 mm (ED). - Aortic root: The aortic root was mildly dilated. - Mitral valve: Calcified annulus. There was trivial regurgitation. - Tricuspid valve: There was trivial regurgitation. - Pulmonary arteries: Systolic pressure could not be accurately   estimated.   PHYSICAL EXAM  Temp:  [97.5 F (36.4 C)-98.4 F (36.9 C)] 97.5 F (36.4 C) (03/15 1650) Pulse Rate:  [58-69] 64 (03/15 1650) Resp:  [16-20] 16 (03/15 1650) BP: (129-161)/(67-85) 141/85 (03/15 1650) SpO2:  [92 %-97 %] 95 % (03/15 1650)  General - Well nourished, well developed, mild distress due to diplopia.  Ophthalmologic - Fundi not visualized due to distress from diplopia.  Cardiovascular - Regular rate and rhythm.  Mental Status -  Level of arousal and orientation to time, place, and person were intact. Language including expression, naming, repetition, comprehension was assessed and found intact. Fund of Knowledge was assessed and was intact.  Cranial Nerves II - XII - II - Visual field intact OU. III, IV, VI - right INO, not able to adduction on the right eye. V - Facial sensation intact bilaterally. VII - mild right facial droop. VIII - Hearing & vestibular intact bilaterally. X - Palate elevates symmetrically, mild dysarthria. XI - Chin turning & shoulder shrug intact bilaterally. XII - Tongue protrusion  intact.  Motor Strength - The patient's strength was normal in all extremities and pronator drift was absent.  Bulk was normal and fasciculations were absent.   Motor Tone - Muscle tone was assessed at the neck and appendages and was normal.  Reflexes - The patient's reflexes were 1+ in all extremities and he had no pathological reflexes.  Sensory - Light touch, temperature/pinprick were assessed and were symmetrical.    Coordination - The patient had normal movements in the hands and feet with no ataxia or dysmetria.  Tremor was absent.  Gait and Station - deferred   ASSESSMENT/PLAN Mr. Rutilio Yellowhair is a 76 y.o. male with history of HTN, HLD and CAD presenting with double vision, slurred speech and facial droop. He did not receive IV t-PA due to delay in arrival.   Stroke:   Dorsal pontine infarct secondary to small vessel disease source  Resultant  diplopia  CT head no acute finding.   CTA head and neck no acute finding. No ELVO. small vessel disease. ICA siphons asthero with 30-50% narrowing  MRI  Dorsal pontine small infarct. small vessel disease. Multiple old scattered lacunar infarcts B thalami, pons and cerebellum. Multiple  scattered microhemorrhages  2D Echo  EF 60-65%  LDL pending, 74 in 01/2016  HgbA1c 5.7  SCDs for VTE prophylaxis  Diet regular Room service appropriate? Yes; Fluid consistency: Thin  aspirin 81 mg daily prior to admission, now on aspirin 325 mg daily and clopidogrel 75 mg daily. Continue DAPT for 3 months and then plavix alone  Patient counseled to be compliant with his antithrombotic medications  Ongoing aggressive stroke risk factor management  Therapy recommendations:  pending   Disposition:  pending   Hypertension  Elevated but Stable  Permissive hypertension (OK if < 220/120) but gradually normalize in 5-7 days  Long-term BP goal normotensive  Hyperlipidemia  Home meds:  zocor 20 , resumed in hospital  LDL pending, 74 in  01/2016, goal < 70  Continue statin at discharge  Other Stroke Risk Factors  Advanced age  Overweight, Body mass index is 28.82 kg/m., recommend weight loss, diet and exercise as appropriate   Coronary artery disease hx stent placement  Hospital day # 1  Neurology will sign off. Please call with questions. Pt will follow up with carolyn Hassell Done at [facility] in about 6 weeks. Thanks for the consult.  Rosalin Hawking, MD PhD Stroke Neurology 05/03/2016 7:06 PM  To contact Stroke Continuity provider, please refer to [email or web address] After hours, contact General Neurology

## 2016-05-03 NOTE — Plan of Care (Signed)
Problem: Education: Goal: Knowledge of patient specific risk factors addressed and post discharge goals established will improve Outcome: Progressing Discussed diet and exersice modification. Patient verbalized understanding.  Problem: Self-Care: Goal: Verbalization of feelings and concerns over difficulty with self-care will improve Patient verbalized positive feelings regarding self-care. Goal: Ability to communicate needs accurately will improve Patient has no difficultly communicating needs.  Problem: Nutrition: Goal: Risk of aspiration will decrease No signs or symptoms of aspiration noted. Goal: Dietary intake will improve Dietary intake within normal limits.

## 2016-05-03 NOTE — Evaluation (Signed)
Physical Therapy Evaluation Patient Details Name: [name] MRN: [id number] DOB: 1940-11-03 Today's Date: 05/03/2016   History of Present Illness  Pt is a 76 y.o. male with a h/o of HTN and CAD s/p stent who presented to the ER on 05/02/16 with 1 day h/o acute onset horizontal diplopia when shoveling snow that resulted in a fall. MRI revealed abnormality within the dorsal aspect of the pons, suspicious for possible tiny acute small vessel type infarct. Moderate chronic microvascular ischemic disease with multiple scatter remote lacunar infarcts involving the bilateral thalami, pons, and cerebellum.  Clinical Impression  Patient presents with decreased independence with mobility due to visual deficits, decreased balance with high fall risk (30/56 on Berg balance assessment, scores < 45 indicate high fall risk,) decreased endurance and generalized weakness.  Feel he will benefit from skilled PT in the acute setting to address issues and maximize safety for d/c home with wife assist and follow up outpatient PT at neurorehabilitation center.  Pt. Demonstrating S to minguard with ambulation with walker this session.  Was functioning independently, taking care of 4 acres of land, cutting down trees, etc previously.    Follow Up Recommendations Outpatient PT;Supervision/Assistance - 24 hour    Equipment Recommendations  Rolling walker with 5" wheels    Recommendations for Other Services       Precautions / Restrictions Precautions Precautions: Fall      Mobility  Bed Mobility Overal bed mobility: Modified Independent                Transfers Overall transfer level: Needs assistance Equipment used: Rolling walker (2 wheeled) Transfers: Sit to/from [facility] Sit to Stand: Supervision Stand pivot transfers: Supervision       General transfer comment: for safety  Ambulation/Gait Ambulation/Gait assistance: Min guard;Supervision Ambulation Distance (Feet):  200 Feet Assistive device: Rolling walker (2 wheeled);None Gait Pattern/deviations: Step-through pattern;Wide base of support;Decreased stride length;Decreased step length - left     General Gait Details: initially with walker and S except minguard for safety on turns, then no device about 74' with broadening gait, shorter steps, decreased speed and LOB to L with mod A recovery; returned to using walker with improved safety and independence, but cues for proximity, poture with continued mild L lateral lean  Stairs Stairs: Yes Stairs assistance: Min guard Stair Management: Two rails;One rail Right;Step to pattern;Forwards;Sideways Number of Stairs: 2 (x 2) General stair comments: initially two rails, then with on rail due to reports rails too wide at home, cues for sequence/technique and assist for safety  Wheelchair Mobility    Modified Rankin (Stroke Patients Only) Modified Rankin (Stroke Patients Only) Pre-Morbid Rankin Score: No symptoms Modified Rankin: Moderately severe disability     Balance Overall balance assessment: Needs assistance Sitting-balance support: Feet supported;No upper extremity supported Sitting balance-Leahy Scale: Good     Standing balance support: Bilateral upper extremity supported Standing balance-Leahy Scale: Poor                   Standardized Balance Assessment Standardized Balance Assessment : Berg Balance Test Berg Balance Test Sit to Stand: Able to stand  independently using hands Standing Unsupported: Able to stand 2 minutes with supervision Sitting with Back Unsupported but Feet Supported on Floor or Stool: Able to sit safely and securely 2 minutes Stand to Sit: Controls descent by using hands Transfers: Able to transfer safely, definite need of hands Standing Unsupported with Eyes Closed: Able to stand 10 seconds with supervision Standing Ubsupported  with Feet Together: Needs help to attain position but able to stand for 30 seconds  with feet together From Standing, Reach Forward with Outstretched Arm: Can reach confidently >25 cm (10") From Standing Position, Pick up Object from Floor: Able to pick up shoe, needs supervision From Standing Position, Turn to Look Behind Over each Shoulder: Needs supervision when turning Turn 360 Degrees: Needs assistance while turning Standing Unsupported, Alternately Place Feet on Step/Stool: Needs assistance to keep from falling or unable to try Standing Unsupported, One Foot in Front: Able to take small step independently and hold 30 seconds Standing on One Leg: Unable to try or needs assist to prevent fall Total Score: 30         Pertinent Vitals/Pain Pain Assessment: No/denies pain    Home Living Family/patient expects to be discharged to:: Private residence Living Arrangements: Spouse/significant other Available Help at Discharge: Family;Available 24 hours/day Type of Home: House Home Access: Stairs to enter Entrance Stairs-Rails: Right;Left;Can reach both Entrance Stairs-Number of Steps: 2 or 5 Home Layout: Two level;Able to live on main level with bedroom/bathroom Home Equipment: None      Prior Function Level of Independence: Independent         Comments: driving, gardening, taking care of the yard     Hand Dominance   Dominant Hand: Right    Extremity/Trunk Assessment                Communication   Communication: No difficulties (primary Spanish speaking, has good [place])  Cognition Arousal/Alertness: Awake/alert Behavior During Therapy: WFL for tasks assessed/performed Overall Cognitive Status: Within Functional Limits for tasks assessed                      General Comments General comments (skin integrity, edema, etc.): Educated in fall prevention with footwear, lighting, use of device (and wife assist), clear path, etc    Exercises     Assessment/Plan    PT Assessment Patient needs continued PT services  PT Problem List  Decreased strength;Decreased mobility;Decreased safety awareness;Decreased knowledge of use of DME;Decreased balance;Decreased activity tolerance       PT Treatment Interventions DME instruction;Gait training;Stair training;Balance training;Functional mobility training;Neuromuscular re-education;Therapeutic exercise;Patient/family education;Therapeutic activities    PT Goals (Current goals can be found in the Care Plan section)  Acute Rehab PT Goals Patient Stated Goal: to see normal again PT Goal Formulation: With patient Time For Goal Achievement: 05/10/16 Potential to Achieve Goals: Good    Frequency Min 4X/week   Barriers to discharge        Co-evaluation               End of Session Equipment Utilized During Treatment: Gait belt Activity Tolerance: Patient limited by fatigue Patient left: in bed;with call bell/phone within reach;with bed alarm set   PT Visit Diagnosis: Other abnormalities of gait and mobility (R26.89);Unsteadiness on feet (R26.81)         Time: 5035-4656 PT Time Calculation (min) (ACUTE ONLY): 26 min   Charges:   PT Evaluation $PT Eval Moderate Complexity: 1 Procedure PT Treatments $Gait Training: 8-22 mins   PT G CodesReginia Naas 19-May-2016, 5:09 PM  Magda Kiel, [place] 19-May-2016

## 2016-05-03 NOTE — Progress Notes (Signed)
  Echocardiogram 2D Echocardiogram has been performed.  [name] L Androw 05/03/2016, 3:15 PM

## 2016-05-03 NOTE — Evaluation (Signed)
Occupational Therapy Evaluation Patient Details Name: [name] MRN: [id number] DOB: 11-06-1940 Today's Date: 05/03/2016    History of Present Illness Pt is a 76 y.o. male with a h/o of HTN and CAD s/p stent who presented to the ER on 05/02/16 with 1 day h/o acute onset horizontal diplopia when shoveling snow that resulted in a fall. MRI revealed abnormality within the dorsal aspect of the pons, suspicious for possible tiny acute small vessel type infarct. Moderate chronic microvascular ischemic disease with multiple scatter remote lacunar infarcts involving the bilateral thalami, pons, and cerebellum.   Clinical Impression   PTA Pt independent in ADL/IADL and mobility. Pt currently min A for ADL and min guard A for ambulation in room with RW. Pt experiencing double vision when looking to the left (right side diplopia has resolved), and right eye currently not moving past midline when moving to the left. Pt with very supportive family. Pt will benefit from skilled OT while in the acute setting to maximize safety and independence in ADL, funtional transfers, for Pt and family education, as well as visual compensatory strategies. Pt will require Outpatient OT upon dc to continue with goals and maximize safety. After session, OT received verbal orders from Dr. Erlinda Hong for partial occlusion to address diplopia.     Follow Up Recommendations  Outpatient OT;Supervision/Assistance - 24 hour    Equipment Recommendations  3 in 1 bedside commode;Other (comment) (RW)    Recommendations for Other Services PT consult     Precautions / Restrictions Precautions Precautions: Fall Restrictions Weight Bearing Restrictions: No      Mobility Bed Mobility Overal bed mobility: Modified Independent             General bed mobility comments: increased time and use of bed rail  Transfers Overall transfer level: Needs assistance Equipment used: Rolling walker (2 wheeled) Transfers: Sit to/from  Stand Sit to Stand: Min guard         General transfer comment: for safety with balance    Balance Overall balance assessment: Needs assistance Sitting-balance support: No upper extremity supported;Feet supported Sitting balance-Leahy Scale: Good Sitting balance - Comments: sitting EOB with no back support   Standing balance support: Bilateral upper extremity supported;During functional activity Standing balance-Leahy Scale: Poor Standing balance comment: reliant on RW, performed one sit to stand without RW and Pt requires physical assist from therapist for balance, Pt reports feeling much more secure with RW as well.                            ADL Overall ADL's : Needs assistance/impaired Eating/Feeding: Minimal assistance;Sitting   Grooming: Wash/dry hands;Wash/dry face;Min guard;Standing Grooming Details (indicate cue type and reason): sink level Upper Body Bathing: Supervision/ safety;Sitting   Lower Body Bathing: Supervison/ safety;Sitting/lateral leans Lower Body Bathing Details (indicate cue type and reason): discussed shower safety and Pt will require a shower chair for safety Upper Body Dressing : Set up;Sitting Upper Body Dressing Details (indicate cue type and reason): able to don hospital gown Lower Body Dressing: Min guard;Sit to/from stand;With caregiver independent assisting   Toilet Transfer: Min guard;Ambulation;Regular Toilet;RW (standing to urinate, managed toilet seat)   Toileting- Clothing Manipulation and Hygiene: Minimal assistance   Tub/ Shower Transfer: Walk-in shower;Min guard;With caregiver independent assisting;Ambulation;Rolling walker Tub/Shower Transfer Details (indicate cue type and reason): Stressed safety with supervision during showering Functional mobility during ADLs: Rolling walker;Minimal assistance       Vision Baseline Vision/History: Wears  glasses Wears Glasses: Reading only Patient Visual Report: Diplopia (horizontal,  on Pt's left side only) Vision Assessment?: Yes Eye Alignment: Impaired (comment) (R eye does not move past midline looking left) Ocular Range of Motion: Other (comment) (R eye does not travel past midline when looking left) Alignment/Gaze Preference: Other (comment) (slight head tilt to left) Tracking/Visual Pursuits: Right eye does not track laterally (past midline) Saccades: Additional head turns occurred during testing Convergence: Impaired (comment) (Right eye does not converge) Diplopia Assessment: Disappears with one eye closed;Only with left gaze Additional Comments: during cover and uncover test, R eye deviates     Perception     Praxis      Pertinent Vitals/Pain Pain Assessment: No/denies pain     Hand Dominance Right   Extremity/Trunk Assessment Upper Extremity Assessment Upper Extremity Assessment: Overall WFL for tasks assessed   Lower Extremity Assessment Lower Extremity Assessment: Overall WFL for tasks assessed   Cervical / Trunk Assessment Cervical / Trunk Assessment: Normal   Communication Communication Communication: No difficulties (speaks spanish, but speaks good english)   Cognition Arousal/Alertness: Awake/alert Behavior During Therapy: WFL for tasks assessed/performed Overall Cognitive Status: Within Functional Limits for tasks assessed                     General Comments  Pt's wife and daughter present and willing/able to assist Pt    Exercises       Shoulder Instructions      Home Living Family/patient expects to be discharged to:: Private residence Living Arrangements: Spouse/significant other Available Help at Discharge: Family;Available 24 hours/day Type of Home: House Home Access: Stairs to enter [employer] of Steps: 2 or 5 Entrance Stairs-Rails: Right;Left;Can reach both Home Layout: Two level;Able to live on main level with bedroom/bathroom Alternate Level Stairs-Number of Steps: flight   Bathroom  Shower/Tub: Tub/shower unit Shower/tub characteristics: Architectural technologist: Standard     Home Equipment: None          Prior Functioning/Environment Level of Independence: Independent        Comments: driving, gardening, taking care of the yard        OT Problem List: Decreased activity tolerance;Impaired balance (sitting and/or standing);Impaired vision/perception;Decreased knowledge of use of DME or AE      OT Treatment/Interventions: Visual/perceptual remediation/compensation;Patient/family education;Balance training;Self-care/ADL training    OT Goals(Current goals can be found in the care plan section) Acute Rehab OT Goals Patient Stated Goal: to see normal again OT Goal Formulation: With patient Time For Goal Achievement: 05/17/16 Potential to Achieve Goals: Good ADL Goals Pt Will Perform Grooming: with supervision;standing (with partial occlusion; with RW) Pt Will Transfer to Toilet: with supervision;ambulating;regular height toilet (with partial occlusion) Pt Will Perform Toileting - Clothing Manipulation and hygiene: with modified independence;sit to/from stand Additional ADL Goal #1: Pt will verbalize importance of partial occlusion for safety during functional transfers and ADL.  OT Frequency: Min 3X/week   Barriers to D/C:            Co-evaluation              End of Session Equipment Utilized During Treatment: Gait belt;Rolling walker Nurse Communication: Mobility status  Activity Tolerance: Patient tolerated treatment well Patient left: in chair;with chair alarm set;with call bell/phone within reach;with family/visitor present  OT Visit Diagnosis: Unsteadiness on feet (R26.81);Other symptoms and signs involving the nervous system (R29.898);Other (comment) (Diplopia)                ADL either  performed or assessed with clinical judgement  Time: 1014-1053 OT Time Calculation (min): 39 min Charges:  OT General Charges $OT Visit: 1  Procedure OT Evaluation $OT Eval Moderate Complexity: 1 Procedure OT Treatments $Self Care/Home Management : 8-22 mins $Therapeutic Activity: 8-22 mins G-Codes:     Hulda Humphrey OTR/L [place] 05/03/2016, 12:55 PM

## 2016-05-03 NOTE — Progress Notes (Signed)
Subjective: Currently, the patient is feeling improved. Still with some dizziness when sitting. Binocular diplopia improved some. No further weakness.  Interval Events: MRI brain demonstrates small pontine infarct.  Objective: Vital signs in last 24 hours: Vitals:   05/02/16 2124 05/03/16 0140 05/03/16 0632 05/03/16 0921  BP: (!) 147/74 129/80 140/80 (!) 161/67  Pulse: 66 64 (!) 58 69  Resp: 18 20 20    Temp: 97.7 F (36.5 C) 98.4 F (36.9 C) 97.7 F (36.5 C) 97.7 F (36.5 C)  TempSrc: Oral Oral Oral Oral  SpO2: 96% 97% 97% 92%  Weight:      Height:       Physical Exam: Physical Exam  Constitutional: He is oriented to person, place, and time. No distress.  Lying in bed in NAD. Family at bedside.  Cardiovascular: Normal rate, regular rhythm and normal heart sounds.   Pulmonary/Chest: Effort normal and breath sounds normal.  Abdominal: Soft. Bowel sounds are normal. There is no tenderness.  Musculoskeletal: He exhibits no edema.  Neurological: He is alert and oriented to person, place, and time. Coordination normal.  Very mild R facial droop. OD INO w/ medial gaze palsy still present. OS EOMI. Binocular diplopia improved.   Labs: CBC:  Recent Labs Lab 05/02/16 1012 05/02/16 1037  WBC 8.4  --   NEUTROABS 5.8  --   HGB 14.9 14.3  HCT 42.6 42.0  MCV 91.0  --   PLT 073  --    Metabolic Panel:  Recent Labs Lab 05/02/16 0855 05/02/16 1012 05/02/16 1037  NA 141 137 140  K 4.1 3.7 3.6  CL 102 107 105  CO2 21 23  --   GLUCOSE 122* 121* 119*  BUN 14 13 16   CREATININE 0.86 0.80 0.80  CALCIUM 9.3 9.2  --   ALT  --  18  --   ALKPHOS  --  50  --   BILITOT  --  0.9  --   PROT  --  6.6  --   ALBUMIN  --  3.7  --   LABPROT  --  13.6  --   INR  --  1.04  --    Cardiac Labs:  Recent Labs Lab 05/02/16 1036  TROPIPOC 0.00   Lipid Panel     Component Value Date/Time   CHOL 134 01/20/2016 1047   TRIG 72 01/20/2016 1047   HDL 46 01/20/2016 1047   CHOLHDL  2.9 01/20/2016 1047   VLDL 14 01/20/2016 1047   LDLCALC 74 01/20/2016 1047   BG:  Lab Results  Component Value Date   HGBA1C 5.7 (H) 05/02/2016    Medications: Scheduled Medications: . clopidogrel  75 mg Oral Daily  . simvastatin  20 mg Oral QHS  . sodium chloride flush  3 mL Intravenous Q12H   PRN Medications: acetaminophen **OR** acetaminophen  Assessment/Plan: [name] is a 76 y.o. male with h/o CAD who presents with medial gaze palsy OD, facial droop OD, vertigo thought 2/2 left brainstem infarct.  1) Left brainstem stroke: Primary deficit of medial gaze palsy OD, with mild resolving R facial droop and vertigo. CTA negative for large vessel disease. MRI with small pontine infarct. Risk include known ASCVD and HTN. No h/o tobacco/drug use or DM. Lipids from 01/20/2016 were well controlled. Neurology c/s, appreciate recs. Diplopia improved today but medial gaze palsy OD still significant. May benefit for referral to strabismus surgeon as outpt. - continue telemetry - PT/OT, vestibular rehab - ASA 325mg  qD -  increase statin to rosuvastatin 20mg  qD - f/u echo  2) HTN: BP elevated today to 150-180s/80-90s. Allow permissive HTN BP goal <220/120. Home meds: Coreg 12.5mg  BID, HCTZ 25mg  qD, valsartan 320mg  qD. - hold home meds - PRN hydralazine for BP goal above  3) CAD: h/o stenting several years ago. Follows with Dr. Burt Foster. On ASA and simvastatin 20mg  qD (did not tolerate atorva 2/2 SOB) at home + BP control. No c/o CP or SOB. - switch to rosuvastatin as above  Length of Stay: 1 day(s) Dispo: Anticipated discharge in approximately 1 day(s).  Gerald Raring, MD Pager: [phone] (7AM-5PM) 05/03/2016, 12:08 PM

## 2016-05-04 DIAGNOSIS — I638 Other cerebral infarction: Secondary | ICD-10-CM

## 2016-05-04 DIAGNOSIS — Z888 Allergy status to other drugs, medicaments and biological substances status: Secondary | ICD-10-CM

## 2016-05-04 DIAGNOSIS — Z91018 Allergy to other foods: Secondary | ICD-10-CM

## 2016-05-04 DIAGNOSIS — R42 Dizziness and giddiness: Secondary | ICD-10-CM

## 2016-05-04 LAB — LIPID PANEL
Cholesterol: 114 mg/dL (ref 0–200)
HDL: 38 mg/dL — ABNORMAL LOW (ref >40)
LDL Cholesterol: 62 mg/dL (ref 0–99)
Total CHOL/HDL Ratio: 3 RATIO
Triglycerides: 69 mg/dL (ref <150)
VLDL: 14 mg/dL (ref 0–40)

## 2016-05-04 MED ORDER — CLOPIDOGREL BISULFATE 75 MG PO TABS: 75.0000 mg | ORAL_TABLET | Freq: Every day | ORAL | 0 refills | Status: DC

## 2016-05-04 MED ORDER — ROSUVASTATIN CALCIUM 20 MG PO TABS: 20.0000 mg | ORAL_TABLET | Freq: Every day | ORAL | 0 refills | Status: DC

## 2016-05-04 MED ORDER — ASPIRIN 81 MG PO CHEW: 324.0000 mg | CHEWABLE_TABLET | Freq: Every day | ORAL | 0 refills | Status: DC | PRN

## 2016-05-04 MED ORDER — ASPIRIN 81 MG PO CHEW: 324.0000 mg | CHEWABLE_TABLET | Freq: Every day | ORAL | 0 refills | Status: AC

## 2016-05-04 NOTE — Progress Notes (Signed)
Physical Therapy Treatment Patient Details Name: [name] MRN: [id number] DOB: [birth year] Today's Date: 05/04/2016    History of Present Illness Pt is a 76 y.o. male with a h/o of HTN and CAD s/p stent who presented to the ER on 05/02/16 with 1 day h/o acute onset horizontal diplopia when shoveling snow that resulted in a fall. MRI revealed abnormality within the dorsal aspect of the pons, suspicious for possible tiny acute small vessel type infarct. Moderate chronic microvascular ischemic disease with multiple scatter remote lacunar infarcts involving the bilateral thalami, pons, and cerebellum.    PT Comments    Patient progressing with balance, gait this session.  Feel he demonstrates more even weight shift during gait holding wall railing on R.  Wife present and educated on fall prevention.  Patient reports some R calf tenderness and feels could be due to some work he did when helping his son last week and was on the floor doing work for extended period of time.  RN informed.  Continue to feel pt appropriate for outpatient PT following d/c.   Follow Up Recommendations  Outpatient PT;Supervision/Assistance - 24 hour     Equipment Recommendations  Rolling walker with 5" wheels    Recommendations for Other Services       Precautions / Restrictions Precautions Precautions: Fall    Mobility  Bed Mobility               General bed mobility comments: sitting up EOB, wife assisting with donning underwear  Transfers Overall transfer level: Needs assistance Equipment used: Rolling walker (2 wheeled) Transfers: Sit to/from Stand Sit to Stand: Supervision         General transfer comment: cues for hand placement  Ambulation/Gait Ambulation/Gait assistance: Min guard;Supervision Ambulation Distance (Feet): 300 Feet Assistive device: Rolling walker (2 wheeled) (and wall rail) Gait Pattern/deviations: Step-through pattern;Decreased dorsiflexion - left;Wide base of  support;Decreased step length - left;Steppage     General Gait Details: RW for majority of gait, then used wall railing on R for increased R weight shift x about 40' with S level A.  Still with more L weight shift and demonstrating some steppage pattern at times on L   Stairs Stairs: Yes   Stair Management: One rail Left;Step to pattern;Sideways Number of Stairs: 4 General stair comments: two hands to L railing, second time coming down steps noted able to step down with R hand on rail only  Wheelchair Mobility    Modified Rankin (Stroke Patients Only) Modified Rankin (Stroke Patients Only) Pre-Morbid Rankin Score: No symptoms Modified Rankin: Moderately severe disability     Balance Overall balance assessment: Needs assistance   Sitting balance-Leahy Scale: Good       Standing balance-Leahy Scale: Fair Standing balance comment: performed balance activities at railing side stepping, forward march and heel walking; also with ant/post rocking with demo and cues for technique for increased limits of stability (noted decreased lumbar extension)             High level balance activites: Side stepping      Cognition Arousal/Alertness: Awake/alert Behavior During Therapy: WFL for tasks assessed/performed Overall Cognitive Status: Within Functional Limits for tasks assessed                      Exercises      General Comments General comments (skin integrity, edema, etc.): reviewed fall prevention information with wife who was present during session today      Pertinent Vitals/Pain  Pain Assessment: No/denies pain    Home Living                      Prior Function            PT Goals (current goals can now be found in the care plan section) Progress towards PT goals: Progressing toward goals    Frequency    Min 4X/week      PT Plan Current plan remains appropriate    Co-evaluation             End of Session Equipment Utilized  During Treatment: Gait belt Activity Tolerance: Patient tolerated treatment well Patient left: in chair;with call bell/phone within reach;with family/visitor present;with chair alarm set   PT Visit Diagnosis: Other abnormalities of gait and mobility (R26.89);Unsteadiness on feet (R26.81)     Time: 0623-7628 PT Time Calculation (min) (ACUTE ONLY): 26 min  Charges:  $Gait Training: 8-22 mins $Neuromuscular Re-education: 8-22 mins                    G Codes:       Reginia Naas 30-May-2016, 9:41 AM  Magda Kiel, [place] 2016/05/30

## 2016-05-04 NOTE — Care Management Note (Signed)
Case Management Note  Patient Details  Name: [name] MRN: [id number] Date of Birth: 09-Mar-1940  Subjective/Objective:                    Action/Plan: Pt discharging home with self care. No PCP listed but pt sees MD at [facility].  PT/OT recommending outpatient therapy. MD placed order for [facility] for the patient.  Patient has transportation home.   Expected Discharge Date:  05/04/16               Expected Discharge Plan:  Home/Self Care  In-House Referral:     Discharge planning Services     Post Acute Care Choice:    Choice offered to:     DME Arranged:    DME Agency:     HH Arranged:    HH Agency:     Status of Service:  Completed, signed off  If discussed at [employer] of Stay Meetings, dates discussed:    Additional Comments:  Pollie Friar, RN 05/04/2016, 1:27 PM

## 2016-05-04 NOTE — Progress Notes (Signed)
Occupational Therapy Treatment Patient Details Name: [name] MRN: [id number] DOB: June 02, 1940 Today's Date: 05/04/2016    History of present illness Pt is a 76 y.o. male with a h/o of HTN and CAD s/p stent who presented to the ER on 05/02/16 with 1 day h/o acute onset horizontal diplopia when shoveling snow that resulted in a fall. MRI revealed abnormality within the dorsal aspect of the pons, suspicious for possible tiny acute small vessel type infarct. Moderate chronic microvascular ischemic disease with multiple scatter remote lacunar infarcts involving the bilateral thalami, pons, and cerebellum.   OT comments  Pt making good progress. Contineus to have horizontal diplopia when looking left, but pt states "it's not as bad". Completed education regarding compensatory strategies for visual deficits and educated pt on need to wear partial occlusion glasses when mobilizing or as needed. Pt/wife verbalized understanding. Given written information. Pt will need 3 in1 to use as shower chair and RW for safe DC home. Continue to recommend pt follow up with neuro outpt OT. Pt/wife very appreciative.   Follow Up Recommendations  Outpatient OT;Supervision/Assistance - 24 hour    Equipment Recommendations  3 in 1 bedside commode;Other (comment)    Recommendations for Other Services     Precautions / Restrictions Precautions Precautions: Fall       Mobility Bed Mobility               General bed mobility comments: OOB in chair  Transfers Overall transfer level: Needs assistance Equipment used: Rolling walker (2 wheeled) Transfers: Sit to/from Stand Sit to Stand: Supervision              Balance      at risk for falls - needs to use RW                             ADL   Eating/Feeding: Modified independent   Grooming: Set up   Upper Body Bathing: Set up                           Functional mobility during ADLs: Supervision/safety;Rolling  walker;Cueing for safety General ADL Comments: Pt educated on reducing risk of falls at home and importance of using partial occlusion glasses when walking. Pt/family verbalized understnading. also educated pt/wife on making enviornment safer to reduce risk of falls, in addition to compensatory strategies in environment (increased lighting/contrast adn reduce xlutter) to increase abilty to function.   Educated pt/wife on need to refrain from driving car/lawnmower at this time.      Vision Eye Alignment: Impaired (comment)           Diplopia Assessment: Only with left gaze;Objects split side to side;Disappears with one eye closed   Additional Comments: Pt with increased ability to look left with R eye. Pt able ot cross midlien. continues to complain of diplpia when looking left, but pt states "it's better".  Pt/wife educated on multiple Multiple activities to work on conjugate gaze and functional vision   Quarry manager   Behavior During Therapy: WFL for tasks assessed/performed Overall Cognitive Status: Within Functional Limits for tasks assessed                         Exercises Other Exercises Other Exercises: visault scanning acvities; activities to address smooth pursuits Other Exercises: activities to  work on Cabin crew ( puzzles, maze, Loss adjuster, chartered) Other Exercises:  eye-hand coordiantion activitie (ball/balloon toss,    Shoulder Instructions       General Comments      Pertinent Vitals/ Pain       Pain Assessment: No/denies pain  Home Living                                          Prior Functioning/Environment              Frequency  Min 3X/week        Progress Toward Goals  OT Goals(current goals can now be found in the care plan section)  Progress towards OT goals: Progressing toward goals  Acute Rehab OT Goals Patient Stated Goal: to see normal again OT Goal Formulation: With patient Time For  Goal Achievement: 05/17/16 Potential to Achieve Goals: Good ADL Goals Pt Will Perform Grooming: with supervision;standing Pt Will Transfer to Toilet: with supervision;ambulating;regular height toilet Pt Will Perform Toileting - Clothing Manipulation and hygiene: with modified independence;sit to/from stand Additional ADL Goal #1: Pt will verbalize importance of partial occlusion for safety during functional transfers and ADL.  Plan Discharge plan remains appropriate    Co-evaluation                 End of Session Equipment Utilized During Treatment: Gait belt;Rolling walker  OT Visit Diagnosis: Unsteadiness on feet (R26.81);Other symptoms and signs involving the nervous system (R29.898);Other (comment)   Activity Tolerance Patient tolerated treatment well   Patient Left in chair;with call bell/phone within reach;with chair alarm set;with family/visitor present   Nurse Communication Mobility status;Other (comment)        Time: 2197-5883 OT Time Calculation (min): 35 min  Charges: OT General Charges $OT Visit: 1 Procedure OT Treatments $Therapeutic Activity: 23-37 mins  [facility], OT/L  [phone] 05/04/2016   Jacquline Terrill,[name] 05/04/2016, 1:55 PM

## 2016-05-04 NOTE — Progress Notes (Signed)
Pt discharge education and instructions completed with pt and spouse at bedside. Both voices understanding and denied any questions. Pt IV and telemetry removed; pt to pick up electronically sent prescriptions from preferred pharmacy on file. Pt's home DME 3in1 and walker delivered to pt's beside. Pt discharge home with spouse to transport him home. Pt transported off unit via wheelchair with spouse and belongings to the side. Francis Gaines Riva Sesma RN.

## 2016-05-04 NOTE — Consult Note (Signed)
[facility] CM Primary Care Navigator  05/04/2016  [name] 07/30/1940 [id number]   Met with patient and wife [name]) at the bedside to identify possible discharge needs. Patient reports having dizziness and double vision when shoveling snow that resulted in a fall which led to this admission.    He states that he does not have a primary care provider at present but had seen Dr. Robyn Haber with Primary Care at [facility] for a while.   Primary Care at [facility] was called Butch Penny) while with patient and was told that patient can call and re-establish care with another provider there if needed. Patient/ wife was made aware of it and expressed understanding. Wife states she will call to schedule with a lady doctor whom she sees in that practice.   Patient shared using [facility] and [place] in [place] to obtain medications without difficulty.  Patient states managing his medications at home using his own system of organizing.   Patient reports being able to drive prior to admission but wife will be providing transportation to his doctors' appointments after discharge.  His wife will be the primary caregiver at home as stated.   Discharge plan is home with PT recommendation for Outpatient PT.  Patient and wife voiced understanding to call Primary Care at [facility] when discharged to re-establish with a primary care provider and have a post discharge follow-up appointment within a week or sooner if needs arise. Patient letter was provided as their reminder.  Both denied any further health management needs or concerns at this time.  For additional questions please contact:  Edwena Felty A. Yesli Vanderhoff, BSN, RN-BC [facility] PRIMARY CARE Navigator Cell: [phone]

## 2016-05-04 NOTE — Progress Notes (Signed)
Transitions of Care Pharmacy Note  Plan:  Educated on DAPT with ASA + Plavix (x3 months per Neuro) Addressed concerns regarding timing of blood pressure medication administration  Recommend monitoring blood pressure at home  --------------------------------------------- [name] is an 76 y.o. male who presents with a chief complaint of stroke. In anticipation of discharge, pharmacy has reviewed this patient's prior to admission medication history, as well as current inpatient medications listed per the [facility].  Current medication indications, dosing, frequency, and notable side effects reviewed with patient and family. patient and family verbalized understanding of current inpatient medication regimen and is aware that the After Visit Summary when presented, will represent the most accurate medication list at discharge.   [name] expressed concerns regarding when to take his blood pressure medications. We walked through doses and frequency for each blood pressure medication. I highlighted the importance of adherence and suggested the patient and his wife come up with a schedule that works best for them. We also spoke about the change from simvastatin to rosuvastatin. No further questions or concerns at the conclusion of the visit.   Assessment: Understanding of regimen: good Understanding of indications: good Potential of compliance: good Barriers to Obtaining Medications: No  Patient instructed to contact inpatient pharmacy team with further questions or concerns if needed.    Time spent preparing for discharge counseling: 15 min  Time spent counseling patient: 20 min   Thank you for allowing pharmacy to be a part of this patient's care.  Argie Ramming, PharmD Pharmacy Resident  Pager [phone] 05/04/16 4:28 PM

## 2016-05-04 NOTE — Progress Notes (Addendum)
Subjective: Currently, the patient is feeling improved, sitting up in his chair. Diplopia improved with his partial tape on glasses.  Objective: Vital signs in last 24 hours: Vitals:   05/03/16 1650 05/03/16 2100 05/04/16 0148 05/04/16 0518  BP: (!) 141/85 (!) 162/92 (!) 142/80 (!) 146/69  Pulse: 64 63 61 (!) 55  Resp: 16  20 20   Temp: 97.5 F (36.4 C) 98.1 F (36.7 C) 97.9 F (36.6 C) 98.5 F (36.9 C)  TempSrc: Oral Oral Oral Oral  SpO2: 95% 92% 99% 96%  Weight:      Height:       Physical Exam: Physical Exam  Constitutional: He is oriented to person, place, and time. No distress.  Pt sitting up in chair, looking well. Family at bedside.  Cardiovascular: Normal rate, regular rhythm and normal heart sounds.   Pulmonary/Chest: Effort normal and breath sounds normal.  Abdominal: Soft. Bowel sounds are normal. There is no tenderness.  Musculoskeletal: He exhibits no edema.  Neurological: He is alert and oriented to person, place, and time. Coordination normal.  Very mild R facial droop. OD INO w/ medial gaze palsy still present. OS EOMI. Binocular diplopia improved.   Labs: CBC:  Recent Labs Lab 05/02/16 1012 05/02/16 1037  WBC 8.4  --   NEUTROABS 5.8  --   HGB 14.9 14.3  HCT 42.6 42.0  MCV 91.0  --   PLT 876  --    Metabolic Panel:  Recent Labs Lab 05/02/16 0855 05/02/16 1012 05/02/16 1037  NA 141 137 140  K 4.1 3.7 3.6  CL 102 107 105  CO2 21 23  --   GLUCOSE 122* 121* 119*  BUN 14 13 16   CREATININE 0.86 0.80 0.80  CALCIUM 9.3 9.2  --   ALT  --  18  --   ALKPHOS  --  50  --   BILITOT  --  0.9  --   PROT  --  6.6  --   ALBUMIN  --  3.7  --   LABPROT  --  13.6  --   INR  --  1.04  --    Cardiac Labs:  Recent Labs Lab 05/02/16 1036  TROPIPOC 0.00   Lipid Panel     Component Value Date/Time   CHOL 134 01/20/2016 1047   TRIG 72 01/20/2016 1047   HDL 46 01/20/2016 1047   CHOLHDL 2.9 01/20/2016 1047   VLDL 14 01/20/2016 1047   LDLCALC 74  01/20/2016 1047   BG:   Lab Results  Component Value Date   HGBA1C 5.7 (H) 05/02/2016    Medications: Scheduled Medications: . clopidogrel  75 mg Oral Daily  . rosuvastatin  20 mg Oral q1800  . sodium chloride flush  3 mL Intravenous Q12H   PRN Medications: acetaminophen **OR** acetaminophen  Assessment/Plan: [name] is a 76 y.o. male with h/o CAD who presents with medial gaze palsy OD, facial droop OD, vertigo thought 2/2 left brainstem infarct.  1) Left brainstem stroke: Primary deficit of medial gaze palsy OD, with mild resolving R facial droop and vertigo. CTA negative for large vessel disease. MRI with small pontine infarct. Risk include known ASCVD and HTN. No h/o tobacco/drug use or DM. Lipids from 01/20/2016 were well controlled. Neurology c/s, appreciate recs. Diplopia improved today but medial gaze palsy OD still significant. May benefit for referral to strabismus surgeon as outpt. - continue telemetry - PT/OT, vestibular rehab - ASA 325mg  + Plavix x 3 months  per Neuro - rosuvastatin 20mg  qD  2) HTN: BP elevated today to 150-180s/80-90s. Allow permissive HTN BP goal <220/120. Home meds: Coreg 12.5mg  BID, HCTZ 25mg  qD, valsartan 320mg  qD. - restart home meds  3) CAD: h/o stenting several years ago. Follows with Dr. Burt Knack. On ASA and simvastatin 20mg  qD (did not tolerate atorva 2/2 SOB) at home + BP control. No c/o CP or SOB. - switch to rosuvastatin as above  Length of Stay: 2 day(s) Dispo: Anticipated discharge in approximately 1 day(s).  Holley Raring, MD Pager: [phone] (7AM-5PM) 05/04/2016, 12:58 PM    Date: 05/04/2016  Patient name: [name]  Medical record number: [id number]  Date of birth: Oct 26, 1940   This patient's plan of care was discussed with the house staff. Please see Dr, Jamse Arn note for complete details. I concur with their findings and agree with the plan as outlined in his note. Patient was reluctant to be on both  plavix and aspirin due to concerns about what he had seen on television. We will optimize his medical therapy.   Gerald Hayward, MD 05/04/2016, 3:09 PM

## 2016-05-04 NOTE — Discharge Summary (Signed)
Name: [name] MRN: [id number] DOB: 07/14/1940 76 y.o. PCP: No Pcp Per Patient  Date of Admission: 05/02/2016  9:30 AM Date of Discharge: 05/04/2016 Attending Physician: Truman Hayward, MD  Discharge Diagnosis: Principal Problem:   Ischemic stroke [facility]) Active Problems:   Hyperlipidemia with target low density lipoprotein (LDL) cholesterol less than 70 mg/dL   Hypertensive heart disease   Diplopia  Discharge Medications: Allergies as of 05/04/2016      Reactions   Lipitor [atorvastatin] Shortness Of Breath   Pork-derived Products Other (See Comments)   No pork products - unspecified      Medication List    STOP taking these medications   simvastatin 20 MG tablet Commonly known as:  ZOCOR     TAKE these medications   aspirin 81 MG chewable tablet Chew 4 tablets (324 mg total) by mouth daily. What changed:  how much to take  when to take this  reasons to take this   carvedilol 12.5 MG tablet Commonly known as:  COREG Take 1 tablet (12.5 mg total) by mouth 2 (two) times daily.   clopidogrel 75 MG tablet Commonly known as:  PLAVIX Take 1 tablet (75 mg total) by mouth daily.   hydrochlorothiazide 25 MG tablet Commonly known as:  HYDRODIURIL Take 1 tablet (25 mg total) by mouth daily.   rosuvastatin 20 MG tablet Commonly known as:  CRESTOR Take 1 tablet (20 mg total) by mouth daily at 6 PM.   valsartan 320 MG tablet Commonly known as:  DIOVAN Take 1 tablet (320 mg total) by mouth daily.            Durable Medical Equipment        Start     Ordered   05/04/16 1404  For home use only DME 3 n 1  Once     05/04/16 1403   05/04/16 1403  For home use only DME Walker  Once    Question:  Patient needs a walker to treat with the following condition  Answer:  Stroke [facility])   05/04/16 1403      Disposition and follow-up:   Mr.[name] was discharged from [facility] in Good condition.  At the hospital follow up visit  please address:  1.  Diplopia 2/2 acute stroke: Assess for rehabilitation and functional improvements? Assess tolerance of DAPT and high intensity statin. Assess risk factor control including BP. Consider referral to strabismus surgery for intervention if diplopia does not improve with therapy.  Follow-up Appointments: Follow-up Information    [name], NP. Schedule an appointment as soon as possible for a visit in 6 week(s).   Specialty:  Family Medicine Contact information: [address] [place] [address] [phone]        Your PCP at the [facility]. Schedule an appointment as soon as possible for a visit in 2 week(s).          Hospital Course by problem list: Principal Problem:   Ischemic stroke [facility]) Active Problems:   Hyperlipidemia with target low density lipoprotein (LDL) cholesterol less than 70 mg/dL   Hypertensive heart disease   Diplopia   1. Dorsal pontine infarct secondary to small vessel disease source: Pt reports that he was out shoveling snow on 05/01/16 when he suddenly developed horizontal diplopia. His visual disturbance was severe and he was unable to walk d/t this, suffering a fall with minor injury to his hip. He was dysarthric with right facial droop. Pt  has a h/o CAD with a stent placed several years ago and is currently taking ASA and statin with which he is compliant. He denies prior CVA. Upon arrival to the ED, CTA Head/Neck demonstrated no significant large vessel disease and MRI Brain revealed dorsal pontine infarct and other chronic small vessel disease (see below). Neurology was consulted and recommended 3 months of DAPT w/ 325mg  ASA and Plavix, then Plavix alone. He was treated with permissive HTN, but at discharge his home antihypertensive regimen was restarted. His statin therapy was advanced to high intensity Rosuvastatin. Therapy recommended outpt therapy for stroke rehabilitation primarily for his diplopia. He should have  follow up with his PCP.  Discharge Vitals:   BP (!) 146/69 (BP Location: Left Arm)   Pulse (!) 55   Temp 98.5 F (36.9 C) (Oral)   Resp 20   Ht 5\' 7"  (1.702 m)   Wt 184 lb (83.5 kg)   SpO2 96%   BMI 28.82 kg/m   Pertinent Labs, Studies, and Procedures: Lipid Panel     Component Value Date/Time   CHOL 114 05/04/2016 0317   TRIG 69 05/04/2016 0317   HDL 38 (L) 05/04/2016 0317   CHOLHDL 3.0 05/04/2016 0317   VLDL 14 05/04/2016 0317   LDLCALC 62 05/04/2016 0317   Hgb A1c: 5.7%  Procedures Performed:  Ct Angio Neck W And/or Wo Contrast Result Date: 05/02/2016 IMPRESSION: No acute finding. No evidence of acute stroke. No evidence of aneurysm or hemorrhage. Chronic small-vessel ischemic changes of the cerebral hemispheric white matter. Atherosclerotic change at both carotid bifurcations and both vertebral artery origins but without measurable stenosis of the carotids. Mild narrowing of the proximal vertebral arteries, not flow limiting. Atherosclerotic disease in both carotid siphon regions with narrowing estimated at 30-50% bilaterally.   Mr Brain Wo Contrast Result Date: 05/02/2016 IMPRESSION: 1. Punctate 4 mm focus of diffusion abnormality within the dorsal aspect of the pons, suspicious for possible tiny acute small vessel type infarct. 2. Moderate chronic microvascular ischemic disease with multiple scatter remote lacunar infarcts involving the bilateral thalami, pons, and cerebellum. 3. Multiple scattered chronic micro hemorrhages as above, most likely due to chronic underlying hypertension.   2D Echo:  Study Conclusions - Left ventricle: The cavity size was normal. There was moderate   concentric hypertrophy. Systolic function was normal. The   estimated ejection fraction was in the range of 60% to 65%. Wall   motion was normal; there were no regional wall motion   abnormalities. There was an increased relative contribution of   atrial contraction to ventricular filling.  Doppler parameters are   consistent with abnormal left ventricular relaxation (grade 1   diastolic dysfunction). - Aortic valve: Trileaflet; mildly thickened leaflets. There was   mild regurgitation. - Aorta: Aortic root dimension: 40 mm (ED). - Aortic root: The aortic root was mildly dilated. - Mitral valve: Calcified annulus. There was trivial regurgitation. - Tricuspid valve: There was trivial regurgitation. - Pulmonary arteries: Systolic pressure could not be accurately   estimated.  Consultations: Dr. Erlinda Hong, Neurology  Discharge Instructions: Discharge Instructions    Ambulatory Referral to Neuro Rehab    Complete by:  As directed    Diplopia 2/2 CVA   Ambulatory referral to Neurology    Complete by:  As directed    Follow up with NP Cecille Rubin at [facility] in about 2 months. Thanks.   Call MD for:  difficulty breathing, headache or visual disturbances    Complete by:  As  directed    Call MD for:  persistant dizziness or light-headedness    Complete by:  As directed    Call MD for:  persistant nausea and vomiting    Complete by:  As directed    Diet - low sodium heart healthy    Complete by:  As directed    Discharge instructions    Complete by:  As directed    You had a stroke which is causing your double vision. We have made some small changes to your medications in order to help lower your risk of future stroke. Please continue to follow up with the therapists to work on rehabilitation. You should also follow up with Neurology in 2 months and with your Primary Doctor in 1-2 weeks.   Increase activity slowly    Complete by:  As directed       Signed: Holley Raring, MD 05/04/2016, 2:03 PM   Pager: [phone]

## 2016-05-17 ENCOUNTER — Encounter: Payer: Self-pay | Admitting: Urgent Care

## 2016-05-17 ENCOUNTER — Ambulatory Visit (INDEPENDENT_AMBULATORY_CARE_PROVIDER_SITE_OTHER): Payer: Medicare HMO | Admitting: Urgent Care

## 2016-05-17 VITALS — BP 138/78 | HR 72 | Temp 98.1°F | Ht 67.0 in | Wt 192.2 lb

## 2016-05-17 DIAGNOSIS — H532 Diplopia: Secondary | ICD-10-CM

## 2016-05-17 DIAGNOSIS — I639 Cerebral infarction, unspecified: Secondary | ICD-10-CM | POA: Diagnosis not present

## 2016-05-17 DIAGNOSIS — E785 Hyperlipidemia, unspecified: Secondary | ICD-10-CM | POA: Diagnosis not present

## 2016-05-17 DIAGNOSIS — I2583 Coronary atherosclerosis due to lipid rich plaque: Secondary | ICD-10-CM | POA: Diagnosis not present

## 2016-05-17 DIAGNOSIS — I10 Essential (primary) hypertension: Secondary | ICD-10-CM | POA: Insufficient documentation

## 2016-05-17 DIAGNOSIS — H538 Other visual disturbances: Secondary | ICD-10-CM | POA: Insufficient documentation

## 2016-05-17 DIAGNOSIS — I2510 Atherosclerotic heart disease of native coronary artery without angina pectoris: Secondary | ICD-10-CM | POA: Diagnosis not present

## 2016-05-17 DIAGNOSIS — I119 Hypertensive heart disease without heart failure: Secondary | ICD-10-CM | POA: Diagnosis not present

## 2016-05-17 HISTORY — DX: Essential (primary) hypertension: I10

## 2016-05-17 HISTORY — DX: Other visual disturbances: H53.8

## 2016-05-17 NOTE — Patient Instructions (Addendum)
Ictus isqumico (Ischemic Stroke) Un ictus isqumico (accidente cerebrovascular o ACV) es la muerte repentina de tejido cerebral que ocurre cuando el oxgeno no llega a una zona del cerebro. Es una emergencia mdica que debe tratarse de inmediato. Un ictus isqumico puede causar una prdida permanente de la actividad cerebral. Esto puede causar problemas con el funcionamiento de diferentes partes del cuerpo. CAUSAS Esta afeccin es causada por la falta de oxgeno en una zona del cerebro, que puede ser el resultado de lo siguiente:  Un pequeo cogulo sanguneo (mbolos) o una acumulacin de placas en los vasos sanguneos (ateroesclerosis) que bloqueen el torrente sanguneo en el cerebro.  Ritmo cardaco anormal (fibrilacin auricular).  Una arteria obstruida o daada en la cabeza o el cuello. FACTORES DE RIESGO Hay ciertos factores que pueden hacer que sea ms propenso a sufrir esta afeccin. Algunos de [place] puede [place], como por ejemplo:  [place].  Fumar cigarrillos.  Tomar anticonceptivos por va oral, en especial si consume tabaco.  Falta de actividad fsica.  Consumo excesivo de bebidas alcohlicas.  Consumo de drogas ilegales, especialmente cocana y metanfetamina. Otros factores de riesgo son los siguientes:  Presin arterial elevada (hipertensin arterial).  Colesterol elevado.  Diabetes mellitus.  Cardiopata coronaria.  Ser afroamericano, norteamericano nativo, hispano o nativo de [place].  Ser mayor de 6aos.  Tener antecedentes familiares de ictus.  Tener antecedentes de cogulos sanguneos, ictus o ataques isqumicos transitorios (AIT).  Anemia drepanoctica.  Ser mujer con antecedentes de preeclampsia.  Cefalea migraosa.  Apnea del sueo.  Tener un ritmo cardaco irregular, como fibrilacin auricular.  Tener enfermedades inflamatorias crnicas, como artritis reumatoide o lupus.  Trastornos de Production assistant, radio). Gratis sntomas de esta afeccin, por lo general, aparecen de forma repentina, o puede notarlos despus de despertarse. Entre los sntomas repentinos se pueden incluir los siguientes:  Debilidad o adormecimiento de la cara, el brazo o la pierna, especialmente en un lado del cuerpo.  Dificultad para caminar, o para mover los brazos o las piernas.  Prdida del equilibrio o de la coordinacin.  Confusin.  Habla arrastrada (disartria).  Dificultad para hablar o comprender el lenguaje, o ambas (afasia).  Cambios en la visin (como visin doble, visin borrosa o prdida de la visin) enuno o ambos ojos.  Mareos.  Nuseas y vmitos.  Dolor de cabeza intenso sin causa aparente. Dolor de cabeza que generalmente se describe Scientist, forensic de cabeza que haya sufrido. En lo posible, tome nota de la hora exacta en la que se sinti normal por ltima vez y de la hora en que comenzaron los sntomas. Infrmele a su mdico. Si los sntomas van y vienen, esto podra ser un signo de ictus de advertencia o AIT. Busque ayuda de inmediato, incluso si se siente mejor. DIAGNSTICO Esta afeccin se puede diagnosticar en funcin de lo siguiente:  Los sntomas, sus antecedentes mdicos y un examen fsico.  Tomografa computarizada (TC) del cerebro.  Resonancia magntica (RM).  Angiografa por tomografa computarizada (TC). En esta prueba, se Constance Haw computadora para tomarle radiografas de las arterias. Pueden inyectarle un colorante en la sangre para observar el interior de los vasos sanguneos con ms claridad.  Angiografa por resonancia magntica (RM). Se trata de un tipo de resonancia magntica que se [place] para estudiar los vasos sanguneos.  Angiografa cerebral. En este estudio se utilizan rayosX y un colorante para observar los vasos sanguneos del cerebro y el cuello. Tal vez deba consultar a un mdico especialista  en ictus. Puede consultar a Radio producer, por telfono o mediante tecnologa a distancia (telemedicina). Se pueden realizar otros estudios para hallar la causa del ictus, que pueden incluir los siguientes:  Aeronautical engineer (ECG).  Monitorizacin electrocardiogrfica continua.  Ecocardiograma.  Ecografa de la cartida.  Estudio de la circulacin cerebral.  Anlisis de [place].  Estudio del sueo para verificar si tiene apnea del sueo. TRATAMIENTO El tratamiento de esta afeccin depender de la duracin, la gravedad y la causa de los sntomas, y de la zona del cerebro afectada. Es muy importante recibir tratamiento tras aparecer los primeros sntomas del ictus. Algunos tratamientos resultan ms eficaces si se realizan en el plazo de las 3 a 6horas del comienzo de los sntomas del ictus. Estos tratamientos pueden incluir los siguientes:  Aspirina.  Medicamentos para controlar la presin arterial.  Un medicamento inyectable para disolver el cogulo sanguneo (tromboltico).  Tratamientos aplicados directamente en la arteria afectada para eliminar o disolver el cogulo sanguneo. Otras opciones de tratamiento son las siguientes:  Oxgeno.  Lquidos por va IV.  Medicamentos para diluir la sangre (anticoagulantes o inhibidores plaquetarios).  Procedimientos para aumentar el flujo sanguneo. La administracin de medicamentos y los cambios en la dieta pueden ayudar a tratar y Chief Technology Officer los factores de riesgo de ictus, como la diabetes, el colesterol alto y la hipertensin arterial. Despus de un ictus, puede trabajar con fisioterapeutas, fonoaudilogos o terapeutas ocupacionales para ayudarlo a recuperarse. [place] los medicamentos de venta libre y los recetados solamente como se lo haya indicado el mdico.  Si le indicaron que tomara medicamentos para diluir la sangre, como aspirinas o anticoagulantes, tmelos exactamente como se lo haya  indicado el mdico.  El exceso de anticoagulantes puede causar hemorragias.  Si no toma la cantidad suficiente, no contar con la proteccin que necesita contra otro ictus y [employer].  Conozca los efectos secundarios de tomar anticoagulantes. Al tomar este tipo de medicamentos, asegrese de lo siguiente:  Oceanographer presin sobre las heridas por ms [employer] lo habitual.  Informe a su dentista y otros mdicos que toma anticoagulantes antes de que le realicen alguna intervencin quirrgica que pueda causar hemorragias.  Evite realizar actividades que puedan causarle traumatismos o lesiones. Comida y bebida  Siga las indicaciones del mdico acerca de la dieta.  Consuma alimentos saludables.  Si el ictus afect su capacidad para tragar, es posible que necesite tomar medidas para no ahogarse, como las siguientes:  Comer de a porciones pequeas.  Comer comidas blandas o en pur. Seguridad  Siga las indicaciones del equipo mdico con respecto a la actividad fsica.  Use un andador o un bastn como se lo haya indicado el mdico.  Tome medidas para crear un entorno seguro en su casa a fin de reducir el riesgo de cadas. Esto puede incluir lo siguiente:  Hacer que profesionales inspeccionen su casa.  Colocar barras para sostn en la habitacin y el bao.  Colocar inodoros elevados y un asiento en la ducha como medidas de seguridad. Instrucciones generales  No consuma ningn producto que contenga tabaco, lo que incluye cigarrillos, tabaco de Higher education careers adviser y Psychologist, sport and exercise. Si necesita ayuda para dejar de fumar, consulte al mdico.  Limite el consumo de alcohol a no ms de 1 medida por da si es mujer y no est Music therapist, y 2 medidas por da si es hombre. Una medida equivale a 12onzas de cerveza, 5onzas de vino o 1onzas de bebidas alcohlicas de  alta graduacin.  Si necesita ayuda para dejar de consumir drogas o alcohol, pdale al mdico que le recomiende un programa o  que lo derive a Teaching laboratory technician.  Mantenga un estilo de vida activo y saludable. Haga actividad fsica habitualmente como se lo haya indicado el mdico.  Acuda a todas las consultas de control como se lo haya indicado el mdico, incluidas las consultas con todos los especialistas de su equipo mdico. Esto es importante. PREVENCIN El riesgo de sufrir otro ictus puede disminuir al tratar, de manera adecuada, la hipertensin arterial, el colesterol alto, la diabetes, las cardiopatas coronarias, la apnea del sueo y la obesidad. Tambin puede disminuir si deja de fumar, limita el consumo de alcohol y se Printmaker. El mdico trabajar con usted para tomar medidas a fin de evitar las complicaciones del ictus a corto y a Barrister's clerk. SOLICITE ATENCIN MDICA DE INMEDIATO SI: Tiene los siguientes sntomas:  Tiene debilidad o adormecimiento sbito en el rostro, el brazo o la pierna, especialmente en un lado del cuerpo.  Confusin sbita.  Dificultad repentina para hablar o comprender el lenguaje, o ambas (afasia).  Dificultad repentina para ver con uno o ambos ojos.  Dificultad repentina para caminar o para mover los brazos o las piernas.  Mareos repentinos.  Prdida repentina del equilibrio o de la coordinacin.  Dolor de cabeza sbito e intenso sin causa aparente.  Prdida parcial o total del conocimiento.  Convulsiones. Cualquiera de estos sntomas puede representar un problema grave y es Engineer, maintenance (IT). No espere hasta que los sntomas desaparezcan. Solicite atencin mdica de inmediato. Comunquese con el servicio de emergencias de su localidad (911 en los [place]). No conduzca por sus propios medios [employer]. Esta informacin no tiene Marine scientist el consejo del mdico. Asegrese de hacerle al mdico cualquier pregunta que tenga. Document Released: 11/15/2004 Document Revised: 02/26/2014 Document Reviewed: 05/04/2015 Elsevier Interactive  Patient Education  2017 [employer].     IF you received an x-ray today, you will receive an invoice from [facility]. Please contact [facility] Radiology at [phone] with questions or concerns regarding your invoice.   IF you received labwork today, you will receive an invoice from [facility]. Please contact [facility] at [phone] with questions or concerns regarding your invoice.   Our billing staff will not be able to assist you with questions regarding bills from these companies.  You will be contacted with the lab results as soon as they are available. The fastest way to get your results is to activate your My Chart account. Instructions are located on the last page of this paperwork. If you have not heard from [place] regarding the results in 2 weeks, please contact this office.

## 2016-05-17 NOTE — Progress Notes (Signed)
    MRN: [id number] DOB: [birth year]  Subjective:   [name] is a 76 y.o. male presenting for follow up on ischemic stroke. Patient was admitted 05/02/2016, diagnosed with ischemic stroke. They were discharged on 05/04/2016 with DAPT of asprin 325mg  with Plavix. They were restarted on BP regimen at discharge. Maintained on rosuvastatin with LDL goal <70. Today, they report significant improvement since discharge. Still has intermittent double and blurred vision. Denies confusion, dizziness, headaches, facial droop, difficulty ambulating, falls, chest pain, heart racing, n/v, abdominal pain, weakness, numbness or tingling. Denies smoking cigarettes. He has not yet started physical therapy. He is not taking 325mg  of aspirin out of concern for bleeding. He is taking Plavix, carvedilol, valsartan and rosuvastatin. He is waiting for his supply of HCTZ to arrive by mail. He is due for f/u with cardiology as well, was supposed to see Dr. Burt Knack this month.  [name] has a current medication list which includes the following prescription(s): aspirin, carvedilol, clopidogrel, hydrochlorothiazide, rosuvastatin, and valsartan. Also is allergic to lipitor [atorvastatin] and pork-derived products. [name]  has a past medical history of Coronary artery disease and Hypertension. Also  has a past surgical history that includes Coronary stent placement.  Objective:   Vitals: BP (!) 151/80 (BP Location: Right Arm, Patient Position: Sitting, Cuff Size: Large)   Pulse 72   Temp 98.1 F (36.7 C) (Oral)   Ht 5\' 7"  (1.702 m)   Wt 192 lb 3.2 oz (87.2 kg)   SpO2 95%   BMI 30.10 kg/m   BP Readings from Last 3 Encounters:  05/17/16 (!) 151/80  05/04/16 (!) 146/69  04/06/16 177/92     Visual Acuity Screening   Right eye Left eye Both eyes  Without correction: 20/20 20/30 20/15   With correction:       Physical Exam  Constitutional: He is oriented to person, place, and time. He appears well-developed and  well-nourished.  HENT:  Mouth/Throat: Oropharynx is clear and moist.  Eyes: EOM are normal. Pupils are equal, round, and reactive to light. No scleral icterus.  Cardiovascular: Normal rate, regular rhythm and intact distal pulses.  Exam reveals no gallop and no friction rub.   No murmur heard. Pulmonary/Chest: No respiratory distress. He has no wheezes. He has no rales.  Musculoskeletal: He exhibits no edema.  Neurological: He is alert and oriented to person, place, and time. He displays normal reflexes. No cranial nerve deficit. Coordination normal.  Speech intact. Negative Romberg and pronator drift.  Skin: Skin is warm and dry.  Psychiatric: He has a normal mood and affect.   Assessment and Plan :   1. Ischemic stroke ([place]) - Improved. Patient counseled on DAPT with aspirin 325mg  and Plavix for a minimum duration of 3 months followed by Plavix alone as recommended by neurology during his hospitalization. Patient agreed. He will let me know about his physical therapy, states that it has been scheduled already but is not certain.  2. Essential hypertension - Restart HCTZ, f/u in 4 weeks, recheck bmet at that point.  3. Hypertensive heart disease without heart failure 4. Coronary artery disease due to lipid rich plaque 5. Hyperlipidemia with target low density lipoprotein (LDL) cholesterol less than 70 mg/dL - Referral to Dr. Burt Knack is pending for f/u  6. Diplopia 7. Blurred vision - Will schedule with ophthalmologist for consult. Physical exam findings reassuring today.  Jaynee Eagles, PA-C [facility] [phone] 05/17/2016 10:06 AM

## 2016-05-18 ENCOUNTER — Other Ambulatory Visit: Payer: Self-pay | Admitting: *Deleted

## 2016-05-18 MED ORDER — ROSUVASTATIN CALCIUM 20 MG PO TABS: 20.0000 mg | ORAL_TABLET | Freq: Every day | ORAL | 1 refills | Status: DC

## 2016-05-18 MED ORDER — CLOPIDOGREL BISULFATE 75 MG PO TABS: 75.0000 mg | ORAL_TABLET | Freq: Every day | ORAL | 1 refills | Status: DC

## 2016-05-31 ENCOUNTER — Telehealth: Payer: Self-pay | Admitting: Cardiovascular Disease

## 2016-05-31 DIAGNOSIS — H35363 Drusen (degenerative) of macula, bilateral: Secondary | ICD-10-CM | POA: Diagnosis not present

## 2016-05-31 DIAGNOSIS — H40053 Ocular hypertension, bilateral: Secondary | ICD-10-CM | POA: Diagnosis not present

## 2016-05-31 DIAGNOSIS — H35033 Hypertensive retinopathy, bilateral: Secondary | ICD-10-CM | POA: Diagnosis not present

## 2016-05-31 DIAGNOSIS — H40013 Open angle with borderline findings, low risk, bilateral: Secondary | ICD-10-CM | POA: Diagnosis not present

## 2016-05-31 MED ORDER — HYDROCHLOROTHIAZIDE 25 MG PO TABS: 25.0000 mg | ORAL_TABLET | Freq: Every day | ORAL | 2 refills | Status: DC

## 2016-05-31 NOTE — Telephone Encounter (Signed)
New message   Pt daughter is calling. She said that the pt has not had the medication in 2 weeks. *STAT* If patient is at the pharmacy, call can be transferred to refill team.   1. Which medications need to be refilled? (please list name of each medication and dose if known) hydrochlorothiazide 25 mg  2. Which pharmacy/location (including street and city if local pharmacy) is medication to be sent to? [facility] on [address]  3. Do they need a 30 day or 90 day supply? 90 day

## 2016-06-04 ENCOUNTER — Encounter (INDEPENDENT_AMBULATORY_CARE_PROVIDER_SITE_OTHER): Payer: Self-pay

## 2016-06-04 ENCOUNTER — Ambulatory Visit (INDEPENDENT_AMBULATORY_CARE_PROVIDER_SITE_OTHER): Payer: Medicare HMO | Admitting: Cardiovascular Disease

## 2016-06-04 ENCOUNTER — Encounter: Payer: Self-pay | Admitting: Cardiovascular Disease

## 2016-06-04 VITALS — BP 160/84 | HR 58 | Ht 66.0 in | Wt 193.8 lb

## 2016-06-04 DIAGNOSIS — I10 Essential (primary) hypertension: Secondary | ICD-10-CM | POA: Diagnosis not present

## 2016-06-04 MED ORDER — AMLODIPINE BESYLATE 5 MG PO TABS: 5.0000 mg | ORAL_TABLET | Freq: Every day | ORAL | 1 refills | Status: DC

## 2016-06-04 MED ORDER — AMLODIPINE BESYLATE 5 MG PO TABS: 5.0000 mg | ORAL_TABLET | Freq: Every day | ORAL | 3 refills | Status: DC

## 2016-06-04 NOTE — Patient Instructions (Signed)
Medication Instructions:  Your physician has recommended you make the following change in your medication:  1. START Amlodipine 5mg  take one tablet by mouth daily  Labwork: No new orders.   Testing/Procedures: No new orders.   Follow-Up: Your physician recommends that you schedule a follow-up appointment in: 3 MONTHS with Richardson Dopp PA-C  Your physician wants you to follow-up in: 6 MONTHS with Dr Burt Knack.  You will receive a reminder letter in the mail two months in advance. If you don't receive a letter, please call our office to schedule the follow-up appointment.   Any Other Special Instructions Will Be Listed Below (If Applicable).     If you need a refill on your cardiac medications before your next appointment, please call your pharmacy.

## 2016-06-04 NOTE — Progress Notes (Signed)
Cardiology Office Note Date:  06/04/2016   ID:  [name], DOB 24-Sep-1940, MRN [id number]  PCP:  No PCP Per Patient  Cardiologist:  Sherren Mocha, MD    Chief Complaint  Patient presents with  . Essential Hypertension    follow up     History of Present Illness: [name] is a 76 y.o. male who presents for Follow-up of coronary artery disease and severe hypertension. The patient was last seen in January 2018. At the time was noted to have uncontrolled hypertension. Blood pressure medicine was adjusted and he was referred to the hypertension clinic for further medicine titration.  Unfortunately he was admitted at [facility] in March with ischemic stroke. He presented with double vision. Still has some residual dizziness and vision issues, but he has had some improvement. He denies chest pain or shortness of breath. Neurology has recommended 3 months of dual antiplatelet therapy followed by monotherapy with clopidogrel. Notes home blood pressures remain elevated.  Past Medical History:  Diagnosis Date  . Coronary artery disease   . Hypertension     Past Surgical History:  Procedure Laterality Date  . CORONARY STENT PLACEMENT      Current Outpatient Prescriptions  Medication Sig Dispense Refill  . aspirin 81 MG chewable tablet Chew 4 tablets (324 mg total) by mouth daily. 90 tablet 0  . carvedilol (COREG) 12.5 MG tablet Take 1 tablet (12.5 mg total) by mouth 2 (two) times daily. 180 tablet 3  . clopidogrel (PLAVIX) 75 MG tablet Take 1 tablet (75 mg total) by mouth daily. 90 tablet 1  . hydrochlorothiazide (HYDRODIURIL) 25 MG tablet Take 1 tablet (25 mg total) by mouth daily. 90 tablet 2  . rosuvastatin (CRESTOR) 20 MG tablet Take 1 tablet (20 mg total) by mouth daily at 6 PM. 90 tablet 1  . valsartan (DIOVAN) 320 MG tablet Take 1 tablet (320 mg total) by mouth daily. 90 tablet 1  . amLODipine (NORVASC) 5 MG tablet Take 1 tablet (5 mg total) by mouth daily. 90  tablet 3   No current facility-administered medications for this visit.     Allergies:   Lipitor [atorvastatin] and Pork-derived products   Social History:  The patient  reports that he has never smoked. He has never used smokeless tobacco. He reports that he does not drink alcohol or use drugs.   Family History:  The patient's  family history includes Cancer in his sister; Heart disease in his mother.    ROS:  Please see the history of present illness.   All other systems are reviewed and negative.    PHYSICAL EXAM: VS:  BP (!) 160/84   Pulse (!) 58   Ht 5\' 6"  (1.676 m)   Wt 193 lb 12.8 oz (87.9 kg)   BMI 31.28 kg/m  , BMI Body mass index is 31.28 kg/m. GEN: Well nourished, well developed, in no acute distress  HEENT: normal  Neck: no JVD, no masses. No carotid bruits Cardiac: RRR without murmur or gallop                Respiratory:  clear to auscultation bilaterally, normal work of breathing GI: soft, nontender, nondistended, + BS MS: no deformity or atrophy  Ext: no pretibial edema, pedal pulses 2+= bilaterally Skin: warm and dry, no rash Neuro:  Strength and sensation are intact Psych: euthymic mood, full affect  EKG:  EKG is ordered today. The ekg ordered today shows sinus bradycardia 58 bpm, left axis deviation,  voltage criteria for LVH  Recent Labs: 05/02/2016: ALT 18; BUN 16; Creatinine, Ser 0.80; Hemoglobin 14.3; Platelets 171; Potassium 3.6; Sodium 140   Lipid Panel     Component Value Date/Time   CHOL 114 05/04/2016 0317   TRIG 69 05/04/2016 0317   HDL 38 (L) 05/04/2016 0317   CHOLHDL 3.0 05/04/2016 0317   VLDL 14 05/04/2016 0317   LDLCALC 62 05/04/2016 0317   LDLDIRECT 150.8 06/30/2008 1007      Wt Readings from Last 3 Encounters:  06/04/16 193 lb 12.8 oz (87.9 kg)  05/17/16 192 lb 3.2 oz (87.2 kg)  05/02/16 184 lb (83.5 kg)     Cardiac Studies Reviewed: 2D Echo 05-03-2016: Study Conclusions  - Left ventricle: The cavity size was normal.  There was moderate   concentric hypertrophy. Systolic function was normal. The   estimated ejection fraction was in the range of 60% to 65%. Wall   motion was normal; there were no regional wall motion   abnormalities. There was an increased relative contribution of   atrial contraction to ventricular filling. Doppler parameters are   consistent with abnormal left ventricular relaxation (grade 1   diastolic dysfunction). - Aortic valve: Trileaflet; mildly thickened leaflets. There was   mild regurgitation. - Aorta: Aortic root dimension: 40 mm (ED). - Aortic root: The aortic root was mildly dilated. - Mitral valve: Calcified annulus. There was trivial regurgitation. - Tricuspid valve: There was trivial regurgitation. - Pulmonary arteries: Systolic pressure could not be accurately   estimated.  ASSESSMENT AND PLAN: 1.  Hypertension, uncontrolled, with recent stroke: Patient is tolerating a combination of valsartan, hydrochlorothiazide, and carvedilol. His heart rate is low enough that I am not comfortable increasing his carvedilol further. I have recommended adding amlodipine 5 mg daily. We discussed potential side effects, primarily leg swelling. He will follow-up in 3 months.  2. Coronary artery disease, native vessel: No symptoms of angina present. Continue on dual antiplatelet therapy with aspirin and clopidogrel. Per neurology recommendations he should DC ASA around August 03, 2016.  3. Hyperlipidemia: Continue Crestor 20 mg.  Current medicines are reviewed with the patient today.  The patient does not have concerns regarding medicines.  Labs/ tests ordered today include:   Orders Placed This Encounter  Procedures  . EKG 12-Lead    Disposition:   FU 3 months with Richardson Dopp, PA-C, 6 months with me.   Deatra James, MD  06/04/2016 6:10 PM    [facility] [address], [place], [place]  [address] Phone: [phone]; Fax: [phone]

## 2016-06-14 ENCOUNTER — Encounter: Payer: Self-pay | Admitting: Urgent Care

## 2016-06-14 ENCOUNTER — Ambulatory Visit (INDEPENDENT_AMBULATORY_CARE_PROVIDER_SITE_OTHER): Payer: Medicare HMO | Admitting: Urgent Care

## 2016-06-14 VITALS — BP 136/74 | HR 74 | Temp 97.5°F | Resp 16 | Ht 66.0 in | Wt 192.0 lb

## 2016-06-14 DIAGNOSIS — I10 Essential (primary) hypertension: Secondary | ICD-10-CM | POA: Diagnosis not present

## 2016-06-14 DIAGNOSIS — I639 Cerebral infarction, unspecified: Secondary | ICD-10-CM

## 2016-06-14 DIAGNOSIS — E785 Hyperlipidemia, unspecified: Secondary | ICD-10-CM | POA: Diagnosis not present

## 2016-06-14 DIAGNOSIS — I2510 Atherosclerotic heart disease of native coronary artery without angina pectoris: Secondary | ICD-10-CM | POA: Diagnosis not present

## 2016-06-14 DIAGNOSIS — I2583 Coronary atherosclerosis due to lipid rich plaque: Secondary | ICD-10-CM

## 2016-06-14 DIAGNOSIS — I119 Hypertensive heart disease without heart failure: Secondary | ICD-10-CM | POA: Diagnosis not present

## 2016-06-14 NOTE — Progress Notes (Signed)
   MRN: [id number] DOB: September 18, 1940  Subjective:   [name] is a 76 y.o. male presenting for follow up on HTN. Patient was seen by cardiologist, Dr. Burt Knack, on 06/04/2016. He was started on amlodipine 5mg  for better BP control. My last OV with the patient was on 05/17/2016 for hospital f/u following treatment for ischemic stroke. Today, patient reports that he feels great. Denies dizziness, confusion, difficulties with balance or speech, chronic headache, blurred vision, chest pain, shortness of breath, heart racing, palpitations, nausea, vomiting, abdominal pain, hematuria, lower leg swelling. He has been taking ASA with Plavix. Plan is to stop ASA in 07/2016. Last CBC was completely normal from 05/02/2016 while hospitalized and has consistently been normal for the past 4 years. Denies smoking cigarettes.  [name] has a current medication list which includes the following prescription(s): amlodipine, aspirin, carvedilol, clopidogrel, hydrochlorothiazide, rosuvastatin, and valsartan. Also is allergic to lipitor [atorvastatin] and pork-derived products. [name]  has a past medical history of Coronary artery disease and Hypertension. Also  has a past surgical history that includes Coronary stent placement.  Objective:   Vitals: BP 136/74   Pulse 74   Temp 97.5 F (36.4 C) (Oral)   Resp 16   Ht 5\' 6"  (1.676 m)   Wt 192 lb (87.1 kg)   SpO2 96%   BMI 30.99 kg/m   BP Readings from Last 3 Encounters:  06/14/16 136/74  06/04/16 (!) 160/84  05/17/16 138/78    Wt Readings from Last 3 Encounters:  06/14/16 192 lb (87.1 kg)  06/04/16 193 lb 12.8 oz (87.9 kg)  05/17/16 192 lb 3.2 oz (87.2 kg)   Physical Exam  Constitutional: He is oriented to person, place, and time. He appears well-developed and well-nourished.  HENT:  Mouth/Throat: Oropharynx is clear and moist.  Eyes: EOM are normal. Pupils are equal, round, and reactive to light. No scleral icterus.  Cardiovascular: Normal rate,  regular rhythm and intact distal pulses.  Exam reveals no gallop and no friction rub.   No murmur heard. Pulmonary/Chest: No respiratory distress. He has no wheezes. He has no rales.  Neurological: He is alert and oriented to person, place, and time. He displays normal reflexes. No cranial nerve deficit. Coordination normal.  Negative Romberg and Pronator Drift. Heel-to-shin and finger-to-nose tests negative.  Skin: Skin is warm and dry.  Psychiatric: He has a normal mood and affect.   Assessment and Plan :   1. Essential hypertension 2. Hypertensive heart disease without heart failure 3. Coronary artery disease due to lipid rich plaque 4. Hyperlipidemia with target low density lipoprotein (LDL) cholesterol less than 70 mg/dL 5. Ischemic stroke ([place]) - Stable, continue current medication regimen. At this point, patient's physical exam findings are very reassuring and can follow up in 6 months. He is supposed to f/u with Dr. Burt Knack in 3 months. Return-to-clinic precautions discussed, patient verbalized understanding.   Jaynee Eagles, PA-C [facility] [phone] 06/14/2016 11:09 AM

## 2016-06-14 NOTE — Patient Instructions (Addendum)
Hypertension Hypertension, commonly called high blood pressure, is when the force of blood pumping through the arteries is too strong. The arteries are the blood vessels that carry blood from the heart throughout the body. Hypertension forces the heart to work harder to pump blood and may cause arteries to become narrow or stiff. Having untreated or uncontrolled hypertension can cause heart attacks, strokes, kidney disease, and other problems. A blood pressure reading consists of a higher number over a lower number. Ideally, your blood pressure should be below 120/80. The first ("top") number is called the systolic pressure. It is a measure of the pressure in your arteries as your heart beats. The second ("bottom") number is called the diastolic pressure. It is a measure of the pressure in your arteries as the heart relaxes. What are the causes? The cause of this condition is not known. What increases the risk? Some risk factors for high blood pressure are under your control. Others are not. Factors you can change   Smoking.  Having type 2 diabetes mellitus, high cholesterol, or both.  Not getting enough exercise or physical activity.  Being overweight.  Having too much fat, sugar, calories, or salt (sodium) in your diet.  Drinking too much alcohol. Factors that are difficult or impossible to change   Having chronic kidney disease.  Having a family history of high blood pressure.  Age. Risk increases with age.  Race. You may be at higher risk if you are African-American.  Gender. Men are at higher risk than women before age 45. After age 65, women are at higher risk than men.  Having obstructive sleep apnea.  Stress. What are the signs or symptoms? Extremely high blood pressure (hypertensive crisis) may cause:  Headache.  Anxiety.  Shortness of breath.  Nosebleed.  Nausea and vomiting.  Severe chest pain.  Jerky movements you cannot control (seizures). How is this  diagnosed? This condition is diagnosed by measuring your blood pressure while you are seated, with your arm resting on a surface. The cuff of the blood pressure monitor will be placed directly against the skin of your upper arm at the level of your heart. It should be measured at least twice using the same arm. Certain conditions can cause a difference in blood pressure between your right and left arms. Certain factors can cause blood pressure readings to be lower or higher than normal (elevated) for a short period of time:  When your blood pressure is higher when you are in a health care provider's office than when you are at home, this is called white coat hypertension. Most people with this condition do not need medicines.  When your blood pressure is higher at home than when you are in a health care provider's office, this is called masked hypertension. Most people with this condition may need medicines to control blood pressure. If you have a high blood pressure reading during one visit or you have normal blood pressure with other risk factors:  You may be asked to return on a different day to have your blood pressure checked again.  You may be asked to monitor your blood pressure at home for 1 week or longer. If you are diagnosed with hypertension, you may have other blood or imaging tests to help your health care provider understand your overall risk for other conditions. How is this treated? This condition is treated by making healthy lifestyle changes, such as eating healthy foods, exercising more, and reducing your alcohol intake. Your health   care provider may prescribe medicine if lifestyle changes are not enough to get your blood pressure under control, and if:  Your systolic blood pressure is above 130.  Your diastolic blood pressure is above 80. Your personal target blood pressure may vary depending on your medical conditions, your age, and other factors. Follow these instructions  at home: Eating and drinking   Eat a diet that is high in fiber and potassium, and low in sodium, added sugar, and fat. An example eating plan is called the DASH (Dietary Approaches to Stop Hypertension) diet. To eat this way:  Eat plenty of fresh fruits and vegetables. Try to fill half of your plate at each meal with fruits and vegetables.  Eat whole grains, such as whole wheat pasta, brown rice, or whole grain bread. Fill about one quarter of your plate with whole grains.  Eat or drink low-fat dairy products, such as skim milk or low-fat yogurt.  Avoid fatty cuts of meat, processed or cured meats, and poultry with skin. Fill about one quarter of your plate with lean proteins, such as fish, chicken without skin, beans, eggs, and tofu.  Avoid premade and processed foods. These tend to be higher in sodium, added sugar, and fat.  Reduce your daily sodium intake. Most people with hypertension should eat less than 1,500 mg of sodium a day.  Limit alcohol intake to no more than 1 drink a day for nonpregnant women and 2 drinks a day for men. One drink equals 12 oz of beer, 5 oz of wine, or 1 oz of hard liquor. Lifestyle   Work with your health care provider to maintain a healthy body weight or to lose weight. Ask what an ideal weight is for you.  Get at least 30 minutes of exercise that causes your heart to beat faster (aerobic exercise) most days of the week. Activities may include walking, swimming, or biking.  Include exercise to strengthen your muscles (resistance exercise), such as pilates or lifting weights, as part of your weekly exercise routine. Try to do these types of exercises for 30 minutes at least 3 days a week.  Do not use any products that contain nicotine or tobacco, such as cigarettes and e-cigarettes. If you need help quitting, ask your health care provider.  Monitor your blood pressure at home as told by your health care provider.  Keep all follow-up visits as told by  your health care provider. This is important. Medicines   Take over-the-counter and prescription medicines only as told by your health care provider. Follow directions carefully. Blood pressure medicines must be taken as prescribed.  Do not skip doses of blood pressure medicine. Doing this puts you at risk for problems and can make the medicine less effective.  Ask your health care provider about side effects or reactions to medicines that you should watch for. Contact a health care provider if:  You think you are having a reaction to a medicine you are taking.  You have headaches that keep coming back (recurring).  You feel dizzy.  You have swelling in your ankles.  You have trouble with your vision. Get help right away if:  You develop a severe headache or confusion.  You have unusual weakness or numbness.  You feel faint.  You have severe pain in your chest or abdomen.  You vomit repeatedly.  You have trouble breathing. Summary  Hypertension is when the force of blood pumping through your arteries is too strong. If this condition is   not controlled, it may put you at risk for serious complications.  Your personal target blood pressure may vary depending on your medical conditions, your age, and other factors. For most people, a normal blood pressure is less than 120/80.  Hypertension is treated with lifestyle changes, medicines, or a combination of both. Lifestyle changes include weight loss, eating a healthy, low-sodium diet, exercising more, and limiting alcohol. This information is not intended to replace advice given to you by your health care provider. Make sure you discuss any questions you have with your health care provider. Document Released: 02/05/2005 Document Revised: 01/04/2016 Document Reviewed: 01/04/2016 Elsevier Interactive Patient Education  2017 [employer]vier Inc.     IF you received an x-ray today, you will receive an invoice from [facility].  Please contact  Radiology at [phone] with questions or concerns regarding your invoice.   IF you received labwork today, you will receive an invoice from [facility]. Please contact [facility] at [phone] with questions or concerns regarding your invoice.   Our billing staff will not be able to assist you with questions regarding bills from these companies.  You will be contacted with the lab results as soon as they are available. The fastest way to get your results is to activate your My Chart account. Instructions are located on the last page of this paperwork. If you have not heard from us regarding the results in 2 weeks, please contact this office.      

## 2016-06-18 DIAGNOSIS — H40052 Ocular hypertension, left eye: Secondary | ICD-10-CM | POA: Diagnosis not present

## 2016-06-18 DIAGNOSIS — H40012 Open angle with borderline findings, low risk, left eye: Secondary | ICD-10-CM | POA: Diagnosis not present

## 2016-07-11 ENCOUNTER — Ambulatory Visit: Payer: Medicare HMO | Admitting: Nurse Practitioner

## 2016-07-25 ENCOUNTER — Telehealth: Payer: Self-pay | Admitting: Urgent Care

## 2016-07-25 DIAGNOSIS — I10 Essential (primary) hypertension: Secondary | ICD-10-CM

## 2016-07-25 DIAGNOSIS — R42 Dizziness and giddiness: Secondary | ICD-10-CM

## 2016-07-25 NOTE — Telephone Encounter (Signed)
Patient needs his valsartan (DIOVAN) 320 MG tablet and his carvedilol (COREG) 12.5 MG tablet refilled at the [facility] mail order pharmacy. He has an appointment with Bess Harvest on 12/20/16 but he was just here in 05/2016. He would like to get a 3 month supply of both of these called in.  His call back number is [phone]

## 2016-07-26 MED ORDER — CARVEDILOL 12.5 MG PO TABS: 12.5000 mg | ORAL_TABLET | Freq: Two times a day (BID) | ORAL | 0 refills | Status: DC

## 2016-07-26 MED ORDER — VALSARTAN 320 MG PO TABS: 320.0000 mg | ORAL_TABLET | Freq: Every day | ORAL | 0 refills | Status: DC

## 2016-08-21 ENCOUNTER — Encounter: Payer: Self-pay | Admitting: Physician Assistant

## 2016-09-03 ENCOUNTER — Ambulatory Visit: Payer: Medicare HMO | Admitting: Physician Assistant

## 2016-09-10 ENCOUNTER — Encounter: Payer: Self-pay | Admitting: Physician Assistant

## 2016-09-10 ENCOUNTER — Encounter (INDEPENDENT_AMBULATORY_CARE_PROVIDER_SITE_OTHER): Payer: Self-pay

## 2016-09-10 ENCOUNTER — Ambulatory Visit (INDEPENDENT_AMBULATORY_CARE_PROVIDER_SITE_OTHER): Payer: Medicare HMO | Admitting: Physician Assistant

## 2016-09-10 VITALS — BP 124/70 | HR 66 | Ht 66.0 in | Wt 191.8 lb

## 2016-09-10 DIAGNOSIS — Z8673 Personal history of transient ischemic attack (TIA), and cerebral infarction without residual deficits: Secondary | ICD-10-CM | POA: Diagnosis not present

## 2016-09-10 DIAGNOSIS — I2510 Atherosclerotic heart disease of native coronary artery without angina pectoris: Secondary | ICD-10-CM | POA: Diagnosis not present

## 2016-09-10 DIAGNOSIS — I10 Essential (primary) hypertension: Secondary | ICD-10-CM | POA: Diagnosis not present

## 2016-09-10 DIAGNOSIS — E785 Hyperlipidemia, unspecified: Secondary | ICD-10-CM

## 2016-09-10 NOTE — Patient Instructions (Signed)
Medication Instructions:  Stop taking Aspirin. Remain on all other medications.   Labwork: None   Testing/Procedures: None   Follow-Up: Dr. Sherren Mocha in 3 months.   Any Other Special Instructions Will Be Listed Below (If Applicable).  If you need a refill on your cardiac medications before your next appointment, please call your pharmacy.

## 2016-09-10 NOTE — Progress Notes (Signed)
Cardiology Office Note:    Date:  09/10/2016   ID:  [name], DOB 04/21/1940, MRN [id number]  PCP:  Patient, No Pcp Per  Cardiologist:  Dr. Sherren Foster    Referring Foster: No ref. provider found   Chief Complaint  Patient presents with  . Follow-up    HTN    History of Present Illness:    [name] is a 76 y.o. male with a hx of CAD, HTN, HL, prior CVA.  He is s/p PCI in 2008 with BMS to the LCx and OM when he presented with [place]. He had residual diffuse disease in LAD and RCA. This was treated medically. He has had difficult to treat HTN and was recently seen in the HTN clinic.  He was admitted in 3/18 wit an ischemic CVA.  He was DC on dual antiplatelet Rx for 3 mos and then Clopidogrel alone.  He was last seen by [name] 4/18. He was placed on amlodipine for better blood pressure control.  [name] returns for follow-up. He is here today with his wife.  He denies chest pain, shortness of breath, syncope, orthopnea, PND or significant pedal edema.  Prior CV studies:   The following studies were reviewed today:  Echo 05/03/16 Moderate concentric LVH, EF 60-65, normal wall motion, grade 1 diastolic dysfunction, mild AI, mildly dilated aortic root (40 mm), trivial MR/TR  [facility] 06/2010 LAD ostial 50, proximal 80, distal 60-70 LCx small AV groove 99; OM1 70-80 proximal and 90 distal RCA 30 proximal; PDA ostial 90 EF 70 PCI: BMS to the OM and BMS to the LCx  Past Medical History:  Diagnosis Date  . Blurred vision 05/17/2016  . Chest pain 04/16/2012  . Coronary artery disease   . Coronary artery disease due to lipid rich plaque 06/14/2008   Qualifier: Diagnosis of  By: [name], [name]    . Diplopia   . Essential hypertension 05/17/2016  . Hyperlipidemia with target low density lipoprotein (LDL) cholesterol less than 70 mg/dL 06/14/2008   Qualifier: Diagnosis of  By: [name], [name]    . Hypertension   . Hypertensive heart disease 06/14/2008   Qualifier: Diagnosis of  By: [name], [place], Foster    . Hypertensive urgency, malignant 04/16/2012  . Internal hemorrhoids 04/19/2012  . Ischemic stroke ([name]) 05/02/2016  . pandiverticulosis 04/22/2012   12/17/2011. [name]. Colonoscopy. Moderate sized internal hemorrhoids and extensive pandiverticulosis. Repeat 5 years.    . Polycythemia vera(238.4) 04/17/2012  . Syncope and collapse 04/17/2012   Pt syncopized while sitting in bed giving history @ time of admission to hospital Tachycardic (appeared sinus) to 120s-130s and hypotensive to 50s/30s.  Unresponsive initially >> Spontaneously resolved after 2-3 minutes >> return to baseline ~30 minutes   . Vertigo     Past Surgical History:  Procedure Laterality Date  . CORONARY STENT PLACEMENT      Current Medications: Current Meds  Medication Sig  . amLODipine (NORVASC) 5 MG tablet Take 1 tablet (5 mg total) by mouth daily.  . carvedilol (COREG) 12.5 MG tablet Take 1 tablet (12.5 mg total) by mouth 2 (two) times daily.  . clopidogrel (PLAVIX) 75 MG tablet Take 1 tablet (75 mg total) by mouth daily.  . hydrochlorothiazide (HYDRODIURIL) 25 MG tablet Take 1 tablet (25 mg total) by mouth daily.  . rosuvastatin (CRESTOR) 20 MG tablet Take 1 tablet (20 mg total) by mouth daily at 6 PM.  . valsartan (DIOVAN) 320 MG tablet  Take 1 tablet (320 mg total) by mouth daily.     Allergies:   Lipitor [atorvastatin] and Pork-derived products   Social History   Social History  . Marital status: Married    Spouse name: N/A  . Number of children: N/A  . Years of education: N/A   Social History Main Topics  . Smoking status: Never Smoker  . Smokeless tobacco: Never Used  . Alcohol use No  . Drug use: No  . Sexual activity: Not Asked   Other Topics Concern  . None   Social History Narrative   Lives in [place] with Wife and 2 sons.  From [place].  To [place] ~2000.     Currently retired but worked in Engineer, agricultural     Family Hx: The patient's family history includes Cancer in his sister; Heart disease in his mother.  ROS:   Please see the history of present illness.    ROS All other systems reviewed and are negative.   EKGs/Labs/Other Test Reviewed:    EKG:  EKG is not ordered today.  The ekg ordered today demonstrates n/a  Recent Labs: 05/02/2016: ALT 18; BUN 16; Creatinine, Ser 0.80; Hemoglobin 14.3; Platelets 171; Potassium 3.6; Sodium 140   Recent Lipid Panel Lab Results  Component Value Date/Time   CHOL 114 05/04/2016 03:17 AM   TRIG 69 05/04/2016 03:17 AM   HDL 38 (L) 05/04/2016 03:17 AM   CHOLHDL 3.0 05/04/2016 03:17 AM   LDLCALC 62 05/04/2016 03:17 AM   LDLDIRECT 150.8 06/30/2008 10:07 AM    Physical Exam:    VS:  BP 124/70   Pulse 66   Ht 5\' 6"  (1.676 m)   Wt 191 lb 12.8 oz (87 kg)   BMI 30.96 kg/m     Wt Readings from Last 3 Encounters:  09/10/16 191 lb 12.8 oz (87 kg)  06/14/16 192 lb (87.1 kg)  06/04/16 193 lb 12.8 oz (87.9 kg)     Physical Exam  Constitutional: He is oriented to person, place, and time. He appears well-developed and well-nourished. No distress.  HENT:  Head: Normocephalic and atraumatic.  Eyes: No scleral icterus.  Neck: Normal range of motion. No JVD present.  Cardiovascular: Normal rate, regular rhythm, S1 normal and S2 normal.   No murmur heard. Pulmonary/Chest: Effort normal and breath sounds normal. He has no wheezes. He has no rhonchi. He has no rales.  Abdominal: Soft. There is no tenderness.  Musculoskeletal: He exhibits no edema.  Neurological: He is alert and oriented to person, place, and time.  Skin: Skin is warm and dry.  Psychiatric: He has a normal mood and affect.    ASSESSMENT:    1. Essential hypertension   2. Coronary artery disease involving native coronary artery of native heart without angina pectoris   3. Hyperlipidemia, unspecified hyperlipidemia type   4. History of stroke    PLAN:    In  order of problems listed above:  1. Essential hypertension The patient's blood pressure is controlled on his current regimen.  Blood pressures at home have been optimal as well. Continue current therapy.    2. Coronary artery disease involving native coronary artery of native heart without angina pectoris Status post remote PCI in 2008. He denies anginal symptoms. He remains active. Continue beta blocker, statin, clopidogrel.  3. Hyperlipidemia, unspecified hyperlipidemia type LDL optimal on most recent lab work.  Continue current Rx.    4. History of stroke Reviewed notes from Neurology during his  admission in 3/18. He was to remain on dual antiplatelet therapy for a total of 3 months and then clopidogrel alone. He may stop his aspirin now and remain on clopidogrel 75 mg daily.  Dispo:  Return in about 3 months (around 12/11/2016) for Routine Follow Up, w/ [name].   Medication Adjustments/Labs and Tests Ordered: Current medicines are reviewed at length with the patient today.  Concerns regarding medicines are outlined above.  Tests Ordered: No orders of the defined types were placed in this encounter.  Medication Changes: No orders of the defined types were placed in this encounter.   Signed, Richardson Dopp, PA-C  09/10/2016 4:29 PM    [facility] [address] [place], [place], [place]  [address] Phone: [phone]; Fax: [phone]

## 2016-11-03 ENCOUNTER — Other Ambulatory Visit: Payer: Self-pay | Admitting: Urgent Care

## 2016-11-19 ENCOUNTER — Telehealth: Payer: Self-pay | Admitting: Cardiovascular Disease

## 2016-11-19 NOTE — Telephone Encounter (Signed)
New message    Pt wife is calling for her husband. She said they got a letter to schedule in October with Dr. Burt Knack. Offered an appt with Dr. Burt Knack APP but they refused said pt wants to see Dr. Burt Knack. I asked if pt was having an issue and she said that pt is not feeling well and just had a stroke. Asked if he was having chest pain or sSOB or any symptoms and pt wife said yes sometimes. She would like to talk to nurse. Please call.

## 2016-11-19 NOTE — Telephone Encounter (Signed)
Confirmed with patient's wife that he is not in acute distress. She states he may be a little more tired than usual. She was irritated that the letter received said the patient was due to be seen in October and Dr. Antionette Char next available appointment is in November.  Scheduled patient this Wednesday, October 3, for evaluation with Dr. Burt Knack.  Patient's wife was grateful for call and agrees with treatment plan.

## 2016-11-21 ENCOUNTER — Encounter (INDEPENDENT_AMBULATORY_CARE_PROVIDER_SITE_OTHER): Payer: Self-pay

## 2016-11-21 ENCOUNTER — Ambulatory Visit (INDEPENDENT_AMBULATORY_CARE_PROVIDER_SITE_OTHER): Payer: Medicare HMO | Admitting: Cardiovascular Disease

## 2016-11-21 ENCOUNTER — Encounter: Payer: Self-pay | Admitting: Cardiovascular Disease

## 2016-11-21 VITALS — BP 130/70 | HR 73 | Ht 66.0 in | Wt 191.4 lb

## 2016-11-21 DIAGNOSIS — I2583 Coronary atherosclerosis due to lipid rich plaque: Secondary | ICD-10-CM

## 2016-11-21 DIAGNOSIS — I2510 Atherosclerotic heart disease of native coronary artery without angina pectoris: Secondary | ICD-10-CM | POA: Diagnosis not present

## 2016-11-21 DIAGNOSIS — I10 Essential (primary) hypertension: Secondary | ICD-10-CM | POA: Diagnosis not present

## 2016-11-21 LAB — CBC WITH DIFFERENTIAL/PLATELET
BASOS ABS: 0.1 10*3/uL (ref 0.0–0.2)
Basos: 1 %
EOS (ABSOLUTE): 0.5 10*3/uL — ABNORMAL HIGH (ref 0.0–0.4)
Eos: 6 %
Hematocrit: 43.9 % (ref 37.5–51.0)
Hemoglobin: 15.4 g/dL (ref 13.0–17.7)
Immature Grans (Abs): 0 10*3/uL (ref 0.0–0.1)
Immature Granulocytes: 0 %
LYMPHS ABS: 1.5 10*3/uL (ref 0.7–3.1)
Lymphs: 18 %
MCH: 31.5 pg (ref 26.6–33.0)
MCHC: 35.1 g/dL (ref 31.5–35.7)
MCV: 90 fL (ref 79–97)
MONOS ABS: 0.5 10*3/uL (ref 0.1–0.9)
Monocytes: 6 %
NEUTROS ABS: 5.6 10*3/uL (ref 1.4–7.0)
Neutrophils: 69 %
Platelets: 212 10*3/uL (ref 150–379)
RBC: 4.89 x10E6/uL (ref 4.14–5.80)
RDW: 13.7 % (ref 12.3–15.4)
WBC: 8.2 10*3/uL (ref 3.4–10.8)

## 2016-11-21 LAB — COMPREHENSIVE METABOLIC PANEL
ALK PHOS: 60 IU/L (ref 39–117)
ALT: 25 IU/L (ref 0–44)
AST: 19 IU/L (ref 0–40)
Albumin/Globulin Ratio: 1.4 (ref 1.2–2.2)
Albumin: 4.2 g/dL (ref 3.5–4.8)
BILIRUBIN TOTAL: 0.6 mg/dL (ref 0.0–1.2)
BUN/Creatinine Ratio: 20 (ref 10–24)
BUN: 18 mg/dL (ref 8–27)
CHLORIDE: 100 mmol/L (ref 96–106)
CO2: 26 mmol/L (ref 20–29)
CREATININE: 0.9 mg/dL (ref 0.76–1.27)
Calcium: 10 mg/dL (ref 8.6–10.2)
GFR calc Af Amer: 97 mL/min/{1.73_m2} (ref >59)
GFR calc non Af Amer: 84 mL/min/{1.73_m2} (ref >59)
GLOBULIN, TOTAL: 2.9 g/dL (ref 1.5–4.5)
Glucose: 160 mg/dL — ABNORMAL HIGH (ref 65–99)
POTASSIUM: 3.8 mmol/L (ref 3.5–5.2)
Sodium: 142 mmol/L (ref 134–144)
Total Protein: 7.1 g/dL (ref 6.0–8.5)

## 2016-11-21 NOTE — Progress Notes (Signed)
Cardiology Office Note Date:  11/21/2016   ID:  [name], DOB 02/09/1941, MRN [id number]  PCP:  Patient, No Pcp Per  Cardiologist:  Sherren Mocha, MD    Chief Complaint  Patient presents with  . Follow-up    CAD/HTN     History of Present Illness: [name] is a 76 y.o. male who presents for follow-up evaluation. He has a hx of CAD, HTN, and ischemic stroke (March 2018).   Here with his wife today. He denies chest pain or pressure. States that he has 'good days and bad days.' he monitors BP at home and lowest BP is in the range of 110-120 mmHg. Highest BP's have been in the 140's.   He complains of periodic dizziness. No shortness of breath. No edema, orthopnea, or PND. He sleeps well.    Past Medical History:  Diagnosis Date  . Blurred vision 05/17/2016  . Chest pain 04/16/2012  . Coronary artery disease   . Coronary artery disease due to lipid rich plaque 06/14/2008   Qualifier: Diagnosis of  By: Mare Ferrari, RMA, [name]    . Diplopia   . Essential hypertension 05/17/2016  . Hyperlipidemia with target low density lipoprotein (LDL) cholesterol less than 70 mg/dL 06/14/2008   Qualifier: Diagnosis of  By: Mare Ferrari, RMA, [name]    . Hypertension   . Hypertensive heart disease 06/14/2008   Qualifier: Diagnosis of  By: Mare Ferrari, [place], [name]    . Hypertensive urgency, malignant 04/16/2012  . Internal hemorrhoids 04/19/2012  . Ischemic stroke ([place]) 05/02/2016  . pandiverticulosis 04/22/2012   12/17/2011. [place]. Juanita Craver MD. Colonoscopy. Moderate sized internal hemorrhoids and extensive pandiverticulosis. Repeat 5 years.    . Polycythemia vera(238.4) 04/17/2012  . Syncope and collapse 04/17/2012   Pt syncopized while sitting in bed giving history @ time of admission to hospital Tachycardic (appeared sinus) to 120s-130s and hypotensive to 50s/30s.  Unresponsive initially >> Spontaneously resolved after 2-3 minutes >> return to baseline ~30 minutes   . Vertigo      Past Surgical History:  Procedure Laterality Date  . CORONARY STENT PLACEMENT      Current Outpatient Prescriptions  Medication Sig Dispense Refill  . amLODipine (NORVASC) 5 MG tablet Take 5 mg by mouth daily.    . carvedilol (COREG) 12.5 MG tablet Take 1 tablet (12.5 mg total) by mouth 2 (two) times daily. 180 tablet 0  . clopidogrel (PLAVIX) 75 MG tablet Take 1 tablet (75 mg total) by mouth daily. 90 tablet 1  . hydrochlorothiazide (HYDRODIURIL) 25 MG tablet Take 1 tablet (25 mg total) by mouth daily. 90 tablet 2  . rosuvastatin (CRESTOR) 20 MG tablet Take 20 mg by mouth daily.    . valsartan (DIOVAN) 320 MG tablet Take 1 tablet (320 mg total) by mouth daily. 90 tablet 0   No current facility-administered medications for this visit.     Allergies:   Lipitor [atorvastatin] and Pork-derived products   Social History:  The patient  reports that he has never smoked. He has never used smokeless tobacco. He reports that he does not drink alcohol or use drugs.   Family History:  The patient's family history includes Cancer in his sister; Heart disease in his mother.    ROS:  Please see the history of present illness.  All other systems are reviewed and negative.    PHYSICAL EXAM: VS:  BP 130/70   Pulse 73   Ht 5\' 6"  (1.676 m)   Wt 86.8 kg (  191 lb 6.4 oz)   BMI 30.89 kg/m  , BMI Body mass index is 30.89 kg/m. GEN: Well nourished, well developed, in no acute distress  HEENT: normal  Neck: no JVD, no masses. No carotid bruits Cardiac: RRR without murmur or gallop                Respiratory:  clear to auscultation bilaterally, normal work of breathing GI: soft, nontender, nondistended, + BS MS: no deformity or atrophy  Ext: no pretibial edema, pedal pulses 2+= bilaterally Skin: warm and dry, no rash Neuro:  Strength and sensation are intact Psych: euthymic mood, full affect  EKG:  EKG is not ordered today.  Recent Labs: 05/02/2016: ALT 18; BUN 16; Creatinine, Ser 0.80;  Hemoglobin 14.3; Platelets 171; Potassium 3.6; Sodium 140   Lipid Panel     Component Value Date/Time   CHOL 114 05/04/2016 0317   TRIG 69 05/04/2016 0317   HDL 38 (L) 05/04/2016 0317   CHOLHDL 3.0 05/04/2016 0317   VLDL 14 05/04/2016 0317   LDLCALC 62 05/04/2016 0317   LDLDIRECT 150.8 06/30/2008 1007      Wt Readings from Last 3 Encounters:  11/21/16 86.8 kg (191 lb 6.4 oz)  09/10/16 87 kg (191 lb 12.8 oz)  06/14/16 87.1 kg (192 lb)     Cardiac Studies Reviewed: 2D Echo 05/03/2016: Study Conclusions  - Left ventricle: The cavity size was normal. There was moderate   concentric hypertrophy. Systolic function was normal. The   estimated ejection fraction was in the range of 60% to 65%. Wall   motion was normal; there were no regional wall motion   abnormalities. There was an increased relative contribution of   atrial contraction to ventricular filling. Doppler parameters are   consistent with abnormal left ventricular relaxation (grade 1   diastolic dysfunction). - Aortic valve: Trileaflet; mildly thickened leaflets. There was   mild regurgitation. - Aorta: Aortic root dimension: 40 mm (ED). - Aortic root: The aortic root was mildly dilated. - Mitral valve: Calcified annulus. There was trivial regurgitation. - Tricuspid valve: There was trivial regurgitation. - Pulmonary arteries: Systolic pressure could not be accurately   estimated.  ASSESSMENT AND PLAN: 1.  CAD, native vessel, without symptoms of angina: continue clopidogrel (hx stroke), statin, beta-blocker.  2. HTN, severe: BP now well-controlled on current antihypertensive regimen.   3. Hyperlipidemia: most recent lipids reviewed. Continue crestor 20 mg daily.   4. Fatigue/weakness: unclear etiology. Does not seem hemodynamically mediated. BP range is acceptable at home. Will check CBC, CMET today.   Current medicines are reviewed with the patient today.  The patient does not have concerns regarding  medicines.  Labs/ tests ordered today include:   Orders Placed This Encounter  Procedures  . CBC with Differential/Platelet  . Comprehensive metabolic panel    Disposition:   FU 6 months  Signed, Sherren Mocha, MD  11/21/2016 1:41 PM    [facility] [place], [place], [place]  [address] Phone: [phone]; Fax: [phone]

## 2016-11-21 NOTE — Patient Instructions (Signed)
Medication Instructions:  Your provider recommends that you continue on your current medications as directed. Please refer to the Current Medication list given to you today.    Labwork: TODAY: CBC, CMET  Testing/Procedures: None  Follow-Up: Your provider wants you to follow-up in: 6 months with Dr. Burt Knack. You will receive a reminder letter in the mail two months in advance. If you don't receive a letter, please call our office to schedule the follow-up appointment.    Any Other Special Instructions Will Be Listed Below (If Applicable).     If you need a refill on your cardiac medications before your next appointment, please call your pharmacy.

## 2016-12-13 DIAGNOSIS — Z800 Family history of malignant neoplasm of digestive organs: Secondary | ICD-10-CM | POA: Diagnosis not present

## 2016-12-13 DIAGNOSIS — K5730 Diverticulosis of large intestine without perforation or abscess without bleeding: Secondary | ICD-10-CM | POA: Diagnosis not present

## 2016-12-13 DIAGNOSIS — Z1211 Encounter for screening for malignant neoplasm of colon: Secondary | ICD-10-CM | POA: Diagnosis not present

## 2016-12-14 ENCOUNTER — Telehealth: Payer: Self-pay | Admitting: Cardiovascular Disease

## 2016-12-14 NOTE — Telephone Encounter (Signed)
You saw her in October, but did not address the colonoscopy. She seemed to generally doing well. OK for colonoscopy? Thanks  Please responds to CV DIV PREOP POOL

## 2016-12-14 NOTE — Telephone Encounter (Signed)
° °  [facility] Pre-operative Risk Assessment    Request for surgical clearance:  1. What type of surgery is being performed? Colonoscopy   2. When is this surgery scheduled? 12/26/16   3. Are there any medications that need to be held prior to surgery and how long? Please inform us of any medication that need to be held  4. Practice name and name of physician performing surgery? [facility] Dr. Nelwyn Salisbury  5. What is your office phone and fax number? [place]: [phone], FAX: [phone]   6. Anesthesia type (None, local, MAC, general) ? Not listed    [name] 12/14/2016, 2:29 PM  _________________________________________________________________   (provider comments below)

## 2016-12-18 NOTE — Telephone Encounter (Signed)
Dr. Copper please advice regarding Plavix as well.

## 2016-12-18 NOTE — Telephone Encounter (Signed)
Pt at low cardiac risk of colonoscopy. May proceed. thanks

## 2016-12-20 ENCOUNTER — Ambulatory Visit: Payer: Medicare HMO | Admitting: Urgent Care

## 2016-12-20 NOTE — Telephone Encounter (Signed)
    Chart reviewed as part of pre-operative protocol coverage. Decision re: cardiac clearance has been addressed by colleagues as below. Per Dr. Burt Knack, cleared for colonoscopy and OK to hold Plavix for 5 days prior to colonoscopy. Please call with any questions. This bundled phone note will be routed to requesting contact.  Charlie Pitter, PA-C  12/20/2016, 12:42 PM

## 2016-12-20 NOTE — Telephone Encounter (Signed)
Fine to proceed and hold plavix x 5 days prior to colonoscopy

## 2016-12-20 NOTE — Telephone Encounter (Signed)
This encounter was created in error - please disregard.

## 2016-12-26 ENCOUNTER — Encounter: Payer: Self-pay | Admitting: Cardiovascular Disease

## 2016-12-26 DIAGNOSIS — Z1211 Encounter for screening for malignant neoplasm of colon: Secondary | ICD-10-CM | POA: Diagnosis not present

## 2016-12-26 DIAGNOSIS — Z800 Family history of malignant neoplasm of digestive organs: Secondary | ICD-10-CM | POA: Diagnosis not present

## 2016-12-26 DIAGNOSIS — K5730 Diverticulosis of large intestine without perforation or abscess without bleeding: Secondary | ICD-10-CM | POA: Diagnosis not present

## 2016-12-27 ENCOUNTER — Other Ambulatory Visit: Payer: Self-pay

## 2016-12-27 ENCOUNTER — Other Ambulatory Visit: Payer: Self-pay | Admitting: Urgent Care

## 2016-12-27 ENCOUNTER — Ambulatory Visit: Payer: Medicare HMO | Admitting: Cardiovascular Disease

## 2016-12-27 DIAGNOSIS — I10 Essential (primary) hypertension: Secondary | ICD-10-CM

## 2016-12-27 MED ORDER — CARVEDILOL 12.5 MG PO TABS: 12.5000 mg | ORAL_TABLET | Freq: Two times a day (BID) | ORAL | 0 refills | Status: DC

## 2016-12-27 MED ORDER — CLOPIDOGREL BISULFATE 75 MG PO TABS: 75.0000 mg | ORAL_TABLET | Freq: Every day | ORAL | 0 refills | Status: DC

## 2016-12-27 NOTE — Telephone Encounter (Signed)
See attached.  Thanks!

## 2016-12-27 NOTE — Telephone Encounter (Signed)
Copied from [place] #[id number]. Topic: Inquiry >> Dec 27, 2016  1:45 PM [name], NT wrote: Reason for CRM: Patient is out of rosuvastatin,20 mg  and also carvedilol 125 mg , the mail order prescription will not arrive until 01/07/17 ,he needs some until l prescription arrives, [facility] uses [facility] in [place]

## 2017-01-03 ENCOUNTER — Ambulatory Visit: Payer: Medicare HMO | Admitting: Cardiovascular Disease

## 2017-04-14 ENCOUNTER — Encounter (HOSPITAL_COMMUNITY): Payer: Self-pay

## 2017-04-14 ENCOUNTER — Inpatient Hospital Stay (HOSPITAL_COMMUNITY)
Admission: EM | Admit: 2017-04-14 | Discharge: 2017-04-24 | DRG: 234 | Disposition: A | Discharge status: Home Health | Payer: Medicare HMO | Attending: Cardiothoracic Surgery | Admitting: Cardiothoracic Surgery

## 2017-04-14 ENCOUNTER — Emergency Department (HOSPITAL_COMMUNITY): Payer: Medicare HMO

## 2017-04-14 DIAGNOSIS — I110 Hypertensive heart disease with heart failure: Secondary | ICD-10-CM | POA: Diagnosis present

## 2017-04-14 DIAGNOSIS — E1169 Type 2 diabetes mellitus with other specified complication: Secondary | ICD-10-CM | POA: Diagnosis not present

## 2017-04-14 DIAGNOSIS — I5032 Chronic diastolic (congestive) heart failure: Secondary | ICD-10-CM | POA: Diagnosis present

## 2017-04-14 DIAGNOSIS — Z4682 Encounter for fitting and adjustment of non-vascular catheter: Secondary | ICD-10-CM | POA: Diagnosis not present

## 2017-04-14 DIAGNOSIS — T82855A Stenosis of coronary artery stent, initial encounter: Secondary | ICD-10-CM | POA: Diagnosis present

## 2017-04-14 DIAGNOSIS — E785 Hyperlipidemia, unspecified: Secondary | ICD-10-CM | POA: Diagnosis present

## 2017-04-14 DIAGNOSIS — D72829 Elevated white blood cell count, unspecified: Secondary | ICD-10-CM | POA: Diagnosis present

## 2017-04-14 DIAGNOSIS — I25810 Atherosclerosis of coronary artery bypass graft(s) without angina pectoris: Secondary | ICD-10-CM | POA: Diagnosis not present

## 2017-04-14 DIAGNOSIS — D62 Acute posthemorrhagic anemia: Secondary | ICD-10-CM | POA: Diagnosis not present

## 2017-04-14 DIAGNOSIS — Z7982 Long term (current) use of aspirin: Secondary | ICD-10-CM | POA: Diagnosis not present

## 2017-04-14 DIAGNOSIS — I25118 Atherosclerotic heart disease of native coronary artery with other forms of angina pectoris: Secondary | ICD-10-CM | POA: Diagnosis not present

## 2017-04-14 DIAGNOSIS — Z7902 Long term (current) use of antithrombotics/antiplatelets: Secondary | ICD-10-CM | POA: Diagnosis not present

## 2017-04-14 DIAGNOSIS — Z91041 Radiographic dye allergy status: Secondary | ICD-10-CM

## 2017-04-14 DIAGNOSIS — I25110 Atherosclerotic heart disease of native coronary artery with unstable angina pectoris: Secondary | ICD-10-CM | POA: Diagnosis present

## 2017-04-14 DIAGNOSIS — I10 Essential (primary) hypertension: Secondary | ICD-10-CM | POA: Diagnosis not present

## 2017-04-14 DIAGNOSIS — Z955 Presence of coronary angioplasty implant and graft: Secondary | ICD-10-CM

## 2017-04-14 DIAGNOSIS — R05 Cough: Secondary | ICD-10-CM | POA: Diagnosis not present

## 2017-04-14 DIAGNOSIS — Y840 Cardiac catheterization as the cause of abnormal reaction of the patient, or of later complication, without mention of misadventure at the time of the procedure: Secondary | ICD-10-CM | POA: Diagnosis present

## 2017-04-14 DIAGNOSIS — Z6832 Body mass index (BMI) 32.0-32.9, adult: Secondary | ICD-10-CM | POA: Diagnosis not present

## 2017-04-14 DIAGNOSIS — R001 Bradycardia, unspecified: Secondary | ICD-10-CM | POA: Diagnosis present

## 2017-04-14 DIAGNOSIS — R7303 Prediabetes: Secondary | ICD-10-CM | POA: Diagnosis present

## 2017-04-14 DIAGNOSIS — I2510 Atherosclerotic heart disease of native coronary artery without angina pectoris: Secondary | ICD-10-CM | POA: Diagnosis not present

## 2017-04-14 DIAGNOSIS — J90 Pleural effusion, not elsewhere classified: Secondary | ICD-10-CM | POA: Diagnosis not present

## 2017-04-14 DIAGNOSIS — E876 Hypokalemia: Secondary | ICD-10-CM

## 2017-04-14 DIAGNOSIS — Z8673 Personal history of transient ischemic attack (TIA), and cerebral infarction without residual deficits: Secondary | ICD-10-CM

## 2017-04-14 DIAGNOSIS — Z8249 Family history of ischemic heart disease and other diseases of the circulatory system: Secondary | ICD-10-CM | POA: Diagnosis not present

## 2017-04-14 DIAGNOSIS — Z01811 Encounter for preprocedural respiratory examination: Secondary | ICD-10-CM

## 2017-04-14 DIAGNOSIS — E669 Obesity, unspecified: Secondary | ICD-10-CM | POA: Diagnosis present

## 2017-04-14 DIAGNOSIS — J9811 Atelectasis: Secondary | ICD-10-CM | POA: Diagnosis not present

## 2017-04-14 DIAGNOSIS — R0600 Dyspnea, unspecified: Secondary | ICD-10-CM

## 2017-04-14 DIAGNOSIS — Z951 Presence of aortocoronary bypass graft: Secondary | ICD-10-CM | POA: Diagnosis not present

## 2017-04-14 DIAGNOSIS — D45 Polycythemia vera: Secondary | ICD-10-CM | POA: Diagnosis present

## 2017-04-14 DIAGNOSIS — D696 Thrombocytopenia, unspecified: Secondary | ICD-10-CM | POA: Diagnosis present

## 2017-04-14 DIAGNOSIS — R000 Tachycardia, unspecified: Secondary | ICD-10-CM | POA: Diagnosis not present

## 2017-04-14 DIAGNOSIS — E8770 Fluid overload, unspecified: Secondary | ICD-10-CM | POA: Diagnosis not present

## 2017-04-14 DIAGNOSIS — Z0189 Encounter for other specified special examinations: Secondary | ICD-10-CM

## 2017-04-14 DIAGNOSIS — E782 Mixed hyperlipidemia: Secondary | ICD-10-CM | POA: Diagnosis not present

## 2017-04-14 DIAGNOSIS — I083 Combined rheumatic disorders of mitral, aortic and tricuspid valves: Secondary | ICD-10-CM | POA: Diagnosis not present

## 2017-04-14 DIAGNOSIS — Z01810 Encounter for preprocedural cardiovascular examination: Secondary | ICD-10-CM | POA: Diagnosis not present

## 2017-04-14 DIAGNOSIS — Z09 Encounter for follow-up examination after completed treatment for conditions other than malignant neoplasm: Secondary | ICD-10-CM

## 2017-04-14 DIAGNOSIS — I214 Non-ST elevation (NSTEMI) myocardial infarction: Principal | ICD-10-CM

## 2017-04-14 DIAGNOSIS — R079 Chest pain, unspecified: Secondary | ICD-10-CM | POA: Diagnosis present

## 2017-04-14 DIAGNOSIS — Z9689 Presence of other specified functional implants: Secondary | ICD-10-CM

## 2017-04-14 HISTORY — DX: Hypokalemia: E87.6

## 2017-04-14 LAB — BASIC METABOLIC PANEL
ANION GAP: 10 (ref 5–15)
BUN: 16 mg/dL (ref 6–20)
CHLORIDE: 107 mmol/L (ref 101–111)
CO2: 21 mmol/L — ABNORMAL LOW (ref 22–32)
Calcium: 7.8 mg/dL — ABNORMAL LOW (ref 8.9–10.3)
Creatinine, Ser: 0.84 mg/dL (ref 0.61–1.24)
GFR calc Af Amer: >60 mL/min (ref >60)
GFR calc non Af Amer: >60 mL/min (ref >60)
GLUCOSE: 150 mg/dL — AB (ref 65–99)
POTASSIUM: 3 mmol/L — AB (ref 3.5–5.1)
Sodium: 138 mmol/L (ref 135–145)

## 2017-04-14 LAB — I-STAT TROPONIN, ED: Troponin i, poc: 0.06 ng/mL (ref 0.00–0.08)

## 2017-04-14 LAB — CBC
HCT: 40.7 % (ref 39.0–52.0)
HEMOGLOBIN: 14.3 g/dL (ref 13.0–17.0)
MCH: 31.8 pg (ref 26.0–34.0)
MCHC: 35.1 g/dL (ref 30.0–36.0)
MCV: 90.4 fL (ref 78.0–100.0)
Platelets: 205 10*3/uL (ref 150–400)
RBC: 4.5 MIL/uL (ref 4.22–5.81)
RDW: 13.8 % (ref 11.5–15.5)
WBC: 7 10*3/uL (ref 4.0–10.5)

## 2017-04-14 LAB — MAGNESIUM: MAGNESIUM: 2 mg/dL (ref 1.7–2.4)

## 2017-04-14 MED ORDER — MORPHINE SULFATE (PF) 4 MG/ML IV SOLN
4.0000 mg | Freq: Once | INTRAVENOUS | Status: AC
  Administered 2017-04-14: 4 mg via INTRAVENOUS
  Filled 2017-04-14: qty 1

## 2017-04-14 MED ORDER — POTASSIUM CHLORIDE CRYS ER 20 MEQ PO TBCR
40.0000 meq | EXTENDED_RELEASE_TABLET | Freq: Once | ORAL | Status: AC
  Administered 2017-04-15: 40 meq via ORAL
  Filled 2017-04-14: qty 2

## 2017-04-14 NOTE — ED Provider Notes (Signed)
[place] EMERGENCY DEPARTMENT Provider Note   CSN: [id number] Arrival date & time: 04/14/17  2148     History   Chief Complaint Chief Complaint  Patient presents with  . Chest Pain    HPI [name] is a 77 y.o. male.  Patient with PMH of CAD, stroke, HTN, HL, presents to the ED with a chief complaint of left sided chest pain that started at 8:45 pm.  He reports associated SOB, pain radiating to left arm, and diaphoresis.  He states that the pain lasted until EMS arrived and was given 1 tab SL nitro with resolution of his symptoms. He also took 4 baby aspirin PTA. He states that the pain is now starting to return in his left arm.  He states that the pain came on at rest.  There is no exertional component.  He experienced one episode that was similar to this yesterday morning while in bed.  Last cath 2012- multiples prior stents  Cardiologist: Dr. Burt Knack   The history is provided by the patient. No language interpreter was used.    Past Medical History:  Diagnosis Date  . Blurred vision 05/17/2016  . Chest pain 04/16/2012  . Coronary artery disease   . Coronary artery disease due to lipid rich plaque 06/14/2008   Qualifier: Diagnosis of  By: Mare Ferrari, RMA, [name]    . Diplopia   . Essential hypertension 05/17/2016  . Hyperlipidemia with target low density lipoprotein (LDL) cholesterol less than 70 mg/dL 06/14/2008   Qualifier: Diagnosis of  By: Mare Ferrari, RMA, [name]    . Hypertension   . Hypertensive heart disease 06/14/2008   Qualifier: Diagnosis of  By: Mare Ferrari, [place], [name]    . Hypertensive urgency, malignant 04/16/2012  . Internal hemorrhoids 04/19/2012  . Ischemic stroke ([place]) 05/02/2016  . pandiverticulosis 04/22/2012   12/17/2011. [place]. Juanita Craver MD. Colonoscopy. Moderate sized internal hemorrhoids and extensive pandiverticulosis. Repeat 5 years.    . Polycythemia vera(238.4) 04/17/2012  . Syncope and collapse 04/17/2012   Pt  syncopized while sitting in bed giving history @ time of admission to hospital Tachycardic (appeared sinus) to 120s-130s and hypotensive to 50s/30s.  Unresponsive initially >> Spontaneously resolved after 2-3 minutes >> return to baseline ~30 minutes   . Vertigo     Patient Active Problem List   Diagnosis Date Noted  . Essential hypertension 05/17/2016  . Blurred vision 05/17/2016  . Vertigo   . Diplopia   . Ischemic stroke ([place]) 05/02/2016  . pandiverticulosis 04/22/2012  . Internal hemorrhoids 04/19/2012  . Hyperlipidemia with target low density lipoprotein (LDL) cholesterol less than 70 mg/dL 06/14/2008  . Hypertensive heart disease 06/14/2008  . Coronary artery disease due to lipid rich plaque 06/14/2008    Past Surgical History:  Procedure Laterality Date  . CORONARY STENT PLACEMENT         Home Medications    Prior to Admission medications   Medication Sig Start Date End Date Taking? Authorizing Provider  amLODipine (NORVASC) 5 MG tablet Take 5 mg by mouth daily.    [provider]  carvedilol (COREG) 12.5 MG tablet Take 1 tablet (12.5 mg total) 2 (two) times daily by mouth. 12/27/16   Jaynee Eagles, PA-C  clopidogrel (PLAVIX) 75 MG tablet Take 1 tablet (75 mg total) daily by mouth. 12/27/16   Jaynee Eagles, PA-C  hydrochlorothiazide (HYDRODIURIL) 25 MG tablet Take 1 tablet (25 mg total) by mouth daily. 05/31/16   Sherren Mocha, MD  rosuvastatin ([place])  20 MG tablet Take 20 mg by mouth daily.    [provider]  valsartan (DIOVAN) 320 MG tablet Take 1 tablet (320 mg total) by mouth daily. 07/26/16   Jaynee Eagles, PA-C    Family History Family History  Problem Relation Age of Onset  . Heart disease Mother   . Cancer Sister     Social History Social History   Tobacco Use  . Smoking status: Never Smoker  . Smokeless tobacco: Never Used  Substance Use Topics  . Alcohol use: No    Alcohol/week: 0.0 oz  . Drug use: No     Allergies   Lipitor  [atorvastatin] and Pork-derived products   Review of Systems Review of Systems  All other systems reviewed and are negative.    Physical Exam Updated Vital Signs BP (!) 152/77 (BP Location: Left Arm)   Pulse 68   Temp 98.5 F (36.9 C) (Oral)   Resp 16   SpO2 98%   Physical Exam  Constitutional: He is oriented to person, place, and time. He appears well-developed and well-nourished.  HENT:  Head: Normocephalic and atraumatic.  Eyes: Conjunctivae and EOM are normal. Pupils are equal, round, and reactive to light. Right eye exhibits no discharge. Left eye exhibits no discharge. No scleral icterus.  Neck: Normal range of motion. Neck supple. No JVD present.  Cardiovascular: Normal rate, regular rhythm and normal heart sounds. Exam reveals no gallop and no friction rub.  No murmur heard. Pulmonary/Chest: Effort normal and breath sounds normal. No respiratory distress. He has no wheezes. He has no rales. He exhibits no tenderness.  Abdominal: Soft. He exhibits no distension and no mass. There is no tenderness. There is no rebound and no guarding.  Musculoskeletal: Normal range of motion. He exhibits no edema or tenderness.  Neurological: He is alert and oriented to person, place, and time.  Skin: Skin is warm and dry.  Psychiatric: He has a normal mood and affect. His behavior is normal. Judgment and thought content normal.  Nursing note and vitals reviewed.    ED Treatments / Results  Labs (all labs ordered are listed, but only abnormal results are displayed) Labs Reviewed  BASIC METABOLIC PANEL - Abnormal; Notable for the following components:      Result Value   Potassium 3.0 (*)    CO2 21 (*)    Glucose, Bld 150 (*)    Calcium 7.8 (*)    All other components within normal limits  CBC  MAGNESIUM  I-STAT TROPONIN, ED    EKG  EKG Interpretation  Date/Time:  Sunday April 14 2017 21:51:03 EST Ventricular Rate:  65 PR Interval:    QRS Duration: 92 QT  Interval:  416 QTC Calculation: 433 R Axis:   3 Text Interpretation:  Sinus rhythm Borderline repolarization abnormality No signficant change from 05/02/2016 Confirmed by Veryl Speak [phone]) on 04/14/2017 10:57:33 PM       Radiology Dg Chest 2 View  Result Date: 04/14/2017 CLINICAL DATA:  Chest pain EXAM: CHEST  2 VIEW COMPARISON:  04/06/2016 FINDINGS: Heart is borderline in size. No confluent opacities, effusions or edema. No acute bony abnormality. IMPRESSION: No active cardiopulmonary disease. Electronically Signed   By: Rolm Baptise M.D.   On: 04/14/2017 22:26    Procedures Procedures (including critical care time)  Medications Ordered in ED Medications - No data to display   Initial Impression / Assessment and Plan / ED Course  I have reviewed the triage vital signs and the  nursing notes.  Pertinent labs & imaging results that were available during my care of the patient were reviewed by me and considered in my medical decision making (see chart for details).     Patient with CP, SOB, left arm pain and diaphoresis.  Hx of CAD.  Multiple prior stents.  Pain started at 8:45 tonight, resolved upon EMS arrival and administration of SL nitro.  Pain currently creeping back, mostly in his arm.   High degree of suspicion for ACS.  HEART score is 6.  10:50 PM Called to bedside.  Patient reports numb feet around the time that morphine was administered.  Re-examined.  No strength or objective sensation deficit.  11:54 PM Appreciate Dr. Maudie Mercury for admitting the patient.  Final Clinical Impressions(s) / ED Diagnoses   Final diagnoses:  Chest pain, unspecified type    ED Discharge Orders    None       Montine Circle, PA-C 04/14/17 2355    Orlie Dakin, MD 04/15/17 [phone]

## 2017-04-14 NOTE — ED Triage Notes (Signed)
Pt arrived via [facility] from home c/o central to left crushing chest pain radiating down left arm.  Started 20:45ish.  EMS gave 325 ASA, 1 SL Nitro.  Pain currently 0/10.

## 2017-04-14 NOTE — ED Notes (Signed)
Pt states his feet are numb, Rob PA notified

## 2017-04-15 ENCOUNTER — Encounter (HOSPITAL_COMMUNITY): Payer: Self-pay | Admitting: Internal Medicine

## 2017-04-15 ENCOUNTER — Other Ambulatory Visit (HOSPITAL_COMMUNITY): Payer: Medicare HMO

## 2017-04-15 DIAGNOSIS — E876 Hypokalemia: Secondary | ICD-10-CM

## 2017-04-15 DIAGNOSIS — Z955 Presence of coronary angioplasty implant and graft: Secondary | ICD-10-CM | POA: Diagnosis not present

## 2017-04-15 DIAGNOSIS — Z8249 Family history of ischemic heart disease and other diseases of the circulatory system: Secondary | ICD-10-CM | POA: Diagnosis not present

## 2017-04-15 DIAGNOSIS — Z6832 Body mass index (BMI) 32.0-32.9, adult: Secondary | ICD-10-CM | POA: Diagnosis not present

## 2017-04-15 DIAGNOSIS — I25110 Atherosclerotic heart disease of native coronary artery with unstable angina pectoris: Secondary | ICD-10-CM

## 2017-04-15 DIAGNOSIS — Z01810 Encounter for preprocedural cardiovascular examination: Secondary | ICD-10-CM | POA: Diagnosis not present

## 2017-04-15 DIAGNOSIS — I2510 Atherosclerotic heart disease of native coronary artery without angina pectoris: Secondary | ICD-10-CM | POA: Diagnosis not present

## 2017-04-15 DIAGNOSIS — Z01811 Encounter for preprocedural respiratory examination: Secondary | ICD-10-CM | POA: Diagnosis not present

## 2017-04-15 DIAGNOSIS — R079 Chest pain, unspecified: Secondary | ICD-10-CM | POA: Diagnosis present

## 2017-04-15 DIAGNOSIS — R7303 Prediabetes: Secondary | ICD-10-CM | POA: Diagnosis present

## 2017-04-15 DIAGNOSIS — E8770 Fluid overload, unspecified: Secondary | ICD-10-CM | POA: Diagnosis not present

## 2017-04-15 DIAGNOSIS — Y840 Cardiac catheterization as the cause of abnormal reaction of the patient, or of later complication, without mention of misadventure at the time of the procedure: Secondary | ICD-10-CM | POA: Diagnosis present

## 2017-04-15 DIAGNOSIS — I214 Non-ST elevation (NSTEMI) myocardial infarction: Secondary | ICD-10-CM | POA: Diagnosis present

## 2017-04-15 DIAGNOSIS — D62 Acute posthemorrhagic anemia: Secondary | ICD-10-CM | POA: Diagnosis not present

## 2017-04-15 DIAGNOSIS — R001 Bradycardia, unspecified: Secondary | ICD-10-CM | POA: Diagnosis present

## 2017-04-15 DIAGNOSIS — D72829 Elevated white blood cell count, unspecified: Secondary | ICD-10-CM | POA: Diagnosis present

## 2017-04-15 DIAGNOSIS — E782 Mixed hyperlipidemia: Secondary | ICD-10-CM | POA: Diagnosis not present

## 2017-04-15 DIAGNOSIS — T82855A Stenosis of coronary artery stent, initial encounter: Secondary | ICD-10-CM | POA: Diagnosis present

## 2017-04-15 DIAGNOSIS — I25118 Atherosclerotic heart disease of native coronary artery with other forms of angina pectoris: Secondary | ICD-10-CM | POA: Diagnosis not present

## 2017-04-15 DIAGNOSIS — Z91041 Radiographic dye allergy status: Secondary | ICD-10-CM | POA: Diagnosis not present

## 2017-04-15 DIAGNOSIS — I110 Hypertensive heart disease with heart failure: Secondary | ICD-10-CM | POA: Diagnosis present

## 2017-04-15 DIAGNOSIS — Z8673 Personal history of transient ischemic attack (TIA), and cerebral infarction without residual deficits: Secondary | ICD-10-CM | POA: Diagnosis not present

## 2017-04-15 DIAGNOSIS — D45 Polycythemia vera: Secondary | ICD-10-CM | POA: Diagnosis present

## 2017-04-15 DIAGNOSIS — Z951 Presence of aortocoronary bypass graft: Secondary | ICD-10-CM | POA: Diagnosis not present

## 2017-04-15 DIAGNOSIS — J9811 Atelectasis: Secondary | ICD-10-CM | POA: Diagnosis not present

## 2017-04-15 DIAGNOSIS — E1169 Type 2 diabetes mellitus with other specified complication: Secondary | ICD-10-CM | POA: Diagnosis not present

## 2017-04-15 DIAGNOSIS — Z7902 Long term (current) use of antithrombotics/antiplatelets: Secondary | ICD-10-CM | POA: Diagnosis not present

## 2017-04-15 DIAGNOSIS — I5032 Chronic diastolic (congestive) heart failure: Secondary | ICD-10-CM | POA: Diagnosis present

## 2017-04-15 DIAGNOSIS — I10 Essential (primary) hypertension: Secondary | ICD-10-CM | POA: Diagnosis not present

## 2017-04-15 DIAGNOSIS — E785 Hyperlipidemia, unspecified: Secondary | ICD-10-CM | POA: Diagnosis present

## 2017-04-15 DIAGNOSIS — D696 Thrombocytopenia, unspecified: Secondary | ICD-10-CM | POA: Diagnosis present

## 2017-04-15 DIAGNOSIS — R000 Tachycardia, unspecified: Secondary | ICD-10-CM | POA: Diagnosis not present

## 2017-04-15 DIAGNOSIS — Z7982 Long term (current) use of aspirin: Secondary | ICD-10-CM | POA: Diagnosis not present

## 2017-04-15 DIAGNOSIS — E669 Obesity, unspecified: Secondary | ICD-10-CM | POA: Diagnosis present

## 2017-04-15 LAB — COMPREHENSIVE METABOLIC PANEL
ALBUMIN: 3.2 g/dL — AB (ref 3.5–5.0)
ALT: 17 U/L (ref 17–63)
AST: 31 U/L (ref 15–41)
Alkaline Phosphatase: 55 U/L (ref 38–126)
Anion gap: 10 (ref 5–15)
BUN: 13 mg/dL (ref 6–20)
CHLORIDE: 103 mmol/L (ref 101–111)
CO2: 24 mmol/L (ref 22–32)
Calcium: 8.6 mg/dL — ABNORMAL LOW (ref 8.9–10.3)
Creatinine, Ser: 0.85 mg/dL (ref 0.61–1.24)
GFR calc Af Amer: >60 mL/min (ref >60)
GFR calc non Af Amer: >60 mL/min (ref >60)
GLUCOSE: 125 mg/dL — AB (ref 65–99)
POTASSIUM: 3.5 mmol/L (ref 3.5–5.1)
Sodium: 137 mmol/L (ref 135–145)
Total Bilirubin: 1 mg/dL (ref 0.3–1.2)
Total Protein: 6.1 g/dL — ABNORMAL LOW (ref 6.5–8.1)

## 2017-04-15 LAB — LIPID PANEL
CHOL/HDL RATIO: 4.5 ratio
Cholesterol: 174 mg/dL (ref 0–200)
HDL: 39 mg/dL — AB (ref >40)
LDL CALC: 126 mg/dL — AB (ref 0–99)
Triglycerides: 46 mg/dL (ref <150)
VLDL: 9 mg/dL (ref 0–40)

## 2017-04-15 LAB — CBC
HEMATOCRIT: 40 % (ref 39.0–52.0)
Hemoglobin: 13.6 g/dL (ref 13.0–17.0)
MCH: 30.8 pg (ref 26.0–34.0)
MCHC: 34 g/dL (ref 30.0–36.0)
MCV: 90.7 fL (ref 78.0–100.0)
Platelets: 196 10*3/uL (ref 150–400)
RBC: 4.41 MIL/uL (ref 4.22–5.81)
RDW: 13.7 % (ref 11.5–15.5)
WBC: 8.2 10*3/uL (ref 4.0–10.5)

## 2017-04-15 LAB — TROPONIN I
TROPONIN I: 0.1 ng/mL — AB (ref <0.03)
Troponin I: 0.14 ng/mL (ref <0.03)
Troponin I: 0.13 ng/mL (ref <0.03)

## 2017-04-15 LAB — MRSA PCR SCREENING: MRSA BY PCR: NEGATIVE

## 2017-04-15 LAB — HEPARIN LEVEL (UNFRACTIONATED)
HEPARIN UNFRACTIONATED: 0.16 [IU]/mL — AB (ref 0.30–0.70)
Heparin Unfractionated: 0.39 IU/mL (ref 0.30–0.70)

## 2017-04-15 MED ORDER — ENOXAPARIN SODIUM 40 MG/0.4ML ~~LOC~~ SOLN: 40.0000 mg | SUBCUTANEOUS | Status: DC

## 2017-04-15 MED ORDER — HEPARIN (PORCINE) IN NACL 100-0.45 UNIT/ML-% IJ SOLN
1400.0000 [IU]/h | INTRAMUSCULAR | Status: DC
  Administered 2017-04-15: 1100 [IU]/h via INTRAVENOUS
  Administered 2017-04-15: 1400 [IU]/h via INTRAVENOUS
  Filled 2017-04-15 (×2): qty 250

## 2017-04-15 MED ORDER — ACETAMINOPHEN 325 MG PO TABS: 650.0000 mg | ORAL_TABLET | Freq: Four times a day (QID) | ORAL | Status: DC | PRN

## 2017-04-15 MED ORDER — HYDROCHLOROTHIAZIDE 25 MG PO TABS
25.0000 mg | ORAL_TABLET | Freq: Every day | ORAL | Status: DC
  Administered 2017-04-15 – 2017-04-16 (×2): 25 mg via ORAL
  Filled 2017-04-15 (×2): qty 1

## 2017-04-15 MED ORDER — ASPIRIN EC 81 MG PO TBEC
81.0000 mg | DELAYED_RELEASE_TABLET | Freq: Every day | ORAL | Status: DC
  Administered 2017-04-15: 81 mg via ORAL
  Filled 2017-04-15: qty 1

## 2017-04-15 MED ORDER — SODIUM CHLORIDE 0.9% FLUSH: 3.0000 mL | INTRAVENOUS | Status: DC | PRN

## 2017-04-15 MED ORDER — SODIUM CHLORIDE 0.9 % IV SOLN: 250.0000 mL | INTRAVENOUS | Status: DC | PRN

## 2017-04-15 MED ORDER — EZETIMIBE 10 MG PO TABS: 10.0000 mg | ORAL_TABLET | Freq: Every day | ORAL | Status: DC

## 2017-04-15 MED ORDER — SODIUM CHLORIDE 0.9 % WEIGHT BASED INFUSION
1.0000 mL/kg/h | INTRAVENOUS | Status: DC
  Administered 2017-04-16: 1 mL/kg/h via INTRAVENOUS

## 2017-04-15 MED ORDER — SODIUM CHLORIDE 0.9% FLUSH
3.0000 mL | Freq: Two times a day (BID) | INTRAVENOUS | Status: DC
  Administered 2017-04-15 – 2017-04-16 (×2): 3 mL via INTRAVENOUS

## 2017-04-15 MED ORDER — ASPIRIN 81 MG PO CHEW
81.0000 mg | CHEWABLE_TABLET | ORAL | Status: AC
  Administered 2017-04-16: 81 mg via ORAL
  Filled 2017-04-15: qty 1

## 2017-04-15 MED ORDER — ROSUVASTATIN CALCIUM 20 MG PO TABS
40.0000 mg | ORAL_TABLET | Freq: Every day | ORAL | Status: DC
  Administered 2017-04-15 – 2017-04-16 (×2): 40 mg via ORAL
  Filled 2017-04-15 (×2): qty 2
  Filled 2017-04-15: qty 1

## 2017-04-15 MED ORDER — SODIUM CHLORIDE 0.9 % WEIGHT BASED INFUSION
3.0000 mL/kg/h | INTRAVENOUS | Status: DC
  Administered 2017-04-16: 3 mL/kg/h via INTRAVENOUS

## 2017-04-15 MED ORDER — SODIUM CHLORIDE 0.9 % IV SOLN
INTRAVENOUS | Status: AC
  Administered 2017-04-15: 01:00:00 via INTRAVENOUS

## 2017-04-15 MED ORDER — ACETAMINOPHEN 650 MG RE SUPP: 650.0000 mg | Freq: Four times a day (QID) | RECTAL | Status: DC | PRN

## 2017-04-15 MED ORDER — HEPARIN BOLUS VIA INFUSION
4000.0000 [IU] | Freq: Once | INTRAVENOUS | Status: AC
  Administered 2017-04-15: 4000 [IU] via INTRAVENOUS
  Filled 2017-04-15: qty 4000

## 2017-04-15 MED ORDER — IRBESARTAN 150 MG PO TABS
300.0000 mg | ORAL_TABLET | Freq: Every day | ORAL | Status: DC
  Administered 2017-04-15: 300 mg via ORAL
  Filled 2017-04-15 (×2): qty 1

## 2017-04-15 MED ORDER — NITROGLYCERIN IN D5W 200-5 MCG/ML-% IV SOLN
0.0000 ug/min | INTRAVENOUS | Status: DC
  Administered 2017-04-15: 5 ug/min via INTRAVENOUS
  Administered 2017-04-16: 10 ug/min via INTRAVENOUS
  Filled 2017-04-15 (×2): qty 250

## 2017-04-15 NOTE — Progress Notes (Signed)
ANTICOAGULATION CONSULT NOTE - Initial Consult  Pharmacy Consult for heparin Indication: chest pain/ACS  Allergies  Allergen Reactions  . Lipitor [Atorvastatin] Shortness Of Breath  . Pork-Derived Products Other (See Comments)    No pork products - unspecified    Patient Measurements: Height: 5\' 6"  (167.6 cm) Weight: 191 lb 5.8 oz (86.8 kg) IBW/kg (Calculated) : 63.8 Heparin Dosing Weight: 80kg  Vital Signs: Temp: 98.5 F (36.9 C) (02/24 2152) Temp Source: Oral (02/24 2152) BP: 138/83 (02/25 0030) Pulse Rate: 59 (02/25 0030)  Labs: Recent Labs    04/14/17 2150  HGB 14.3  HCT 40.7  PLT 205  CREATININE 0.84    Estimated Creatinine Clearance: 79.7 mL/min (by C-G formula based on SCr of 0.84 mg/dL).   Medical History: Past Medical History:  Diagnosis Date  . Blurred vision 05/17/2016  . Chest pain 04/16/2012  . Coronary artery disease   . Coronary artery disease due to lipid rich plaque 06/14/2008   Qualifier: Diagnosis of  By: Mare Ferrari, RMA, [name]    . Diplopia   . Essential hypertension 05/17/2016  . Hyperlipidemia with target low density lipoprotein (LDL) cholesterol less than 70 mg/dL 06/14/2008   Qualifier: Diagnosis of  By: Mare Ferrari, RMA, [name]    . Hypertension   . Hypertensive heart disease 06/14/2008   Qualifier: Diagnosis of  By: Mare Ferrari, [place], [name]    . Hypertensive urgency, malignant 04/16/2012  . Internal hemorrhoids 04/19/2012  . Ischemic stroke ([place]) 05/02/2016  . pandiverticulosis 04/22/2012   12/17/2011. [place]. Juanita Craver MD. Colonoscopy. Moderate sized internal hemorrhoids and extensive pandiverticulosis. Repeat 5 years.    . Polycythemia vera(238.4) 04/17/2012  . Syncope and collapse 04/17/2012   Pt syncopized while sitting in bed giving history @ time of admission to hospital Tachycardic (appeared sinus) to 120s-130s and hypotensive to 50s/30s.  Unresponsive initially >> Spontaneously resolved after 2-3 minutes >> return to baseline  ~30 minutes   . Vertigo     Assessment: 77yo male c/o crushing CP radiating to LUE and associated w/ SOB and diaphoresis, istat troponin negative, to begin heparin.  Goal of Therapy:  Heparin level 0.3-0.7 units/ml Monitor platelets by anticoagulation protocol: Yes   Plan:  Will give heparin 4000 units IV bolus x1 followed by gtt at 1100 units/hr and monitor heparin levels and CBC.  Wynona Neat, PharmD, BCPS  04/15/2017,1:02 AM

## 2017-04-15 NOTE — ED Provider Notes (Signed)
Complains of anterior chest pain onset 8:45 PM tonight counted by left arm pain.  Presently complains of minimal left arm pain only.  Patient is alert nontoxic.  No respiratory distress.  Treated with 4 baby aspirin and 1 sublingual nitroglycerin prior to arrival. Chest x-ray reviewed by me Results for orders placed or performed during the hospital encounter of 80/88/11  Basic metabolic panel  Result Value Ref Range   Sodium 138 135 - 145 mmol/L   Potassium 3.0 (L) 3.5 - 5.1 mmol/L   Chloride 107 101 - 111 mmol/L   CO2 21 (L) 22 - 32 mmol/L   Glucose, Bld 150 (H) 65 - 99 mg/dL   BUN 16 6 - 20 mg/dL   Creatinine, Ser 0.84 0.61 - 1.24 mg/dL   Calcium 7.8 (L) 8.9 - 10.3 mg/dL   GFR calc non Af Amer >60 >60 mL/min   GFR calc Af Amer >60 >60 mL/min   Anion gap 10 5 - 15  CBC  Result Value Ref Range   WBC 7.0 4.0 - 10.5 K/uL   RBC 4.50 4.22 - 5.81 MIL/uL   Hemoglobin 14.3 13.0 - 17.0 g/dL   HCT 40.7 39.0 - 52.0 %   MCV 90.4 78.0 - 100.0 fL   MCH 31.8 26.0 - 34.0 pg   MCHC 35.1 30.0 - 36.0 g/dL   RDW 13.8 11.5 - 15.5 %   Platelets 205 150 - 400 K/uL  Magnesium  Result Value Ref Range   Magnesium 2.0 1.7 - 2.4 mg/dL  I-stat troponin, ED  Result Value Ref Range   Troponin i, poc 0.06 0.00 - 0.08 ng/mL   Comment 3           Dg Chest 2 View  Result Date: 04/14/2017 CLINICAL DATA:  Chest pain EXAM: CHEST  2 VIEW COMPARISON:  04/06/2016 FINDINGS: Heart is borderline in size. No confluent opacities, effusions or edema. No acute bony abnormality. IMPRESSION: No active cardiopulmonary disease. Electronically Signed   By: Rolm Baptise M.D.   On: 04/14/2017 22:26     Orlie Dakin, MD 04/15/17 [phone]

## 2017-04-15 NOTE — ED Notes (Signed)
Floor coverage MD messaged about critical troponin result.

## 2017-04-15 NOTE — ED Notes (Signed)
Pearlie Lafosse- son  [phone]

## 2017-04-15 NOTE — H&P (Signed)
TRH H&P   Patient Demographics:    [name], is a 77 y.o. male  MRN: [id number]   DOB - 29-Mar-1940  Admit Date - 04/14/2017  Outpatient Primary MD for the patient is Sherren Mocha, MD  Referring MD/NP/PA:  Orlie Dakin  Outpatient Specialists: Sherren Mocha  Patient coming from: home  Chief Complaint  Patient presents with  . Chest Pain      HPI:    [name]  is a 77 y.o. male, w CAD , Hypertension, Hyperlipidemia, CVA 2018, apparently had some chest discomfort yesterday. and then developed chest pressure this evening substernal and left sided with radiation to the left arm as well as dyspnea and diaphoresis.  This started about 8:45 pm.  Pt denies fever, chills, cough, palp, n/v, diarrhea, brbpr, black stool.    In Ed,  CXR  IMPRESSION: No active cardiopulmonary disease.  EKG: nsr at 65, nl axis, t flat  In 3, avl.    Na 138, K 3.0 Bun 16, Creatinine 0.84 Wbc 7.0, Hgb 14.3, Plt 205  Trop 0.06 Magnesium 2.0  Pt will be admitted for chest pain            Review of systems:    In addition to the HPI above, No Fever-chills, No Headache, No changes with Vision or hearing, No problems swallowing food or Liquids, No Cough or Shortness of Breath, No Abdominal pain, No Nausea or Vommitting, Bowel movements are regular, No Blood in stool or Urine, No dysuria, No new skin rashes or bruises, No new joints pains-aches,  No new weakness, tingling, numbness in any extremity, No recent weight gain or loss, No polyuria, polydypsia or polyphagia, No significant Mental Stressors.  A full 10 point Review of Systems was done, except as stated above, all other Review of Systems were negative.   With Past History of the following :    Past Medical History:  Diagnosis Date  . Blurred vision 05/17/2016  . Chest pain 04/16/2012  . Coronary  artery disease   . Coronary artery disease due to lipid rich plaque 06/14/2008   Qualifier: Diagnosis of  By: Mare Ferrari, RMA, [name]    . Diplopia   . Essential hypertension 05/17/2016  . Hyperlipidemia with target low density lipoprotein (LDL) cholesterol less than 70 mg/dL 06/14/2008   Qualifier: Diagnosis of  By: Mare Ferrari, RMA, [name]    . Hypertension   . Hypertensive heart disease 06/14/2008   Qualifier: Diagnosis of  By: Mare Ferrari, [place], [name]    . Hypertensive urgency, malignant 04/16/2012  . Internal hemorrhoids 04/19/2012  . Ischemic stroke ([place]) 05/02/2016  . pandiverticulosis 04/22/2012   12/17/2011. [place]. Juanita Craver MD. Colonoscopy. Moderate sized internal hemorrhoids and extensive pandiverticulosis. Repeat 5 years.    . Polycythemia vera(238.4) 04/17/2012  . Syncope and collapse 04/17/2012   Pt syncopized while sitting in bed giving history @ time  of admission to hospital Tachycardic (appeared sinus) to 120s-130s and hypotensive to 50s/30s.  Unresponsive initially >> Spontaneously resolved after 2-3 minutes >> return to baseline ~30 minutes   . Vertigo       Past Surgical History:  Procedure Laterality Date  . CORONARY STENT PLACEMENT        Social History:     Social History   Tobacco Use  . Smoking status: Never Smoker  . Smokeless tobacco: Never Used  Substance Use Topics  . Alcohol use: No    Alcohol/week: 0.0 oz     Lives - at home  Mobility - walks by self   Family History :     Family History  Problem Relation Age of Onset  . Heart disease Mother   . Cancer Sister       Home Medications:   Prior to Admission medications   Medication Sig Start Date End Date Taking? Authorizing Provider  aspirin EC 81 MG tablet Take 81 mg by mouth daily.   Yes [provider]  hydrochlorothiazide (HYDRODIURIL) 25 MG tablet Take 1 tablet (25 mg total) by mouth daily. 05/31/16  Yes Sherren Mocha, MD  valsartan (DIOVAN) 320 MG tablet Take 1  tablet (320 mg total) by mouth daily. 07/26/16  Yes Jaynee Eagles, PA-C  carvedilol (COREG) 12.5 MG tablet Take 1 tablet (12.5 mg total) 2 (two) times daily by mouth. Patient not taking: Reported on 04/14/2017 12/27/16   Jaynee Eagles, PA-C  clopidogrel (PLAVIX) 75 MG tablet Take 1 tablet (75 mg total) daily by mouth. Patient not taking: Reported on 04/14/2017 12/27/16   Jaynee Eagles, PA-C     Allergies:     Allergies  Allergen Reactions  . Lipitor [Atorvastatin] Shortness Of Breath  . Pork-Derived Products Other (See Comments)    No pork products - unspecified     Physical Exam:   Vitals  Blood pressure 130/71, pulse 63, temperature 98.5 F (36.9 C), temperature source Oral, resp. rate 16, SpO2 95 %.   1. General lying in bed in NAD,   2. Normal affect and insight, Not Suicidal or Homicidal, Awake Alert, Oriented X 3.  3. No F.N deficits, ALL C.Nerves Intact, Strength 5/5 all 4 extremities, Sensation intact all 4 extremities, Plantars down going.  4. Ears and Eyes appear Normal, Conjunctivae clear, PERRLA. Moist Oral Mucosa.  5. Supple Neck, No JVD, No cervical lymphadenopathy appriciated, No Carotid Bruits.  6. Symmetrical Chest wall movement, Good air movement bilaterally, CTAB.  7. RRR, No Gallops, Rubs or Murmurs, No Parasternal Heave.  8. Positive Bowel Sounds, Abdomen Soft, No tenderness, No organomegaly appriciated,No rebound -guarding or rigidity.  9.  No Cyanosis, Normal Skin Turgor, No Skin Rash or Bruise.  10. Good muscle tone,  joints appear normal , no effusions, Normal ROM.  11. No Palpable Lymph Nodes in Neck or Axillae     Data Review:    CBC Recent Labs  Lab 04/14/17 2150  WBC 7.0  HGB 14.3  HCT 40.7  PLT 205  MCV 90.4  MCH 31.8  MCHC 35.1  RDW 13.8   ------------------------------------------------------------------------------------------------------------------  Chemistries  Recent Labs  Lab 04/14/17 2150 04/14/17 2308  NA 138  --   K  3.0*  --   CL 107  --   CO2 21*  --   GLUCOSE 150*  --   BUN 16  --   CREATININE 0.84  --   CALCIUM 7.8*  --   MG  --  2.0   ------------------------------------------------------------------------------------------------------------------  CrCl cannot be calculated (Unknown ideal weight.). ------------------------------------------------------------------------------------------------------------------ No results for input(s): TSH, T4TOTAL, T3FREE, THYROIDAB in the last 72 hours.  Invalid input(s): FREET3  Coagulation profile No results for input(s): INR, PROTIME in the last 168 hours. ------------------------------------------------------------------------------------------------------------------- No results for input(s): DDIMER in the last 72 hours. -------------------------------------------------------------------------------------------------------------------  Cardiac Enzymes No results for input(s): CKMB, TROPONINI, MYOGLOBIN in the last 168 hours.  Invalid input(s): CK ------------------------------------------------------------------------------------------------------------------ No results found for: BNP   ---------------------------------------------------------------------------------------------------------------  Urinalysis    Component Value Date/Time   COLORURINE YELLOW 01/21/2007 2134   APPEARANCEUR CLEAR 01/21/2007 2134   LABSPEC 1.020 01/21/2007 2134   PHURINE 5.5 01/21/2007 2134   GLUCOSEU NEGATIVE 01/21/2007 2134   HGBUR NEGATIVE 01/21/2007 2134   BILIRUBINUR small 05/31/2014 [place] 01/21/2007 2134   PROTEINUR trace 05/31/2014 [place] 01/21/2007 2134   UROBILINOGEN 0.2 05/31/2014 1146   UROBILINOGEN 0.2 01/21/2007 2134   NITRITE neg 05/31/2014 1146   NITRITE NEGATIVE 01/21/2007 2134   LEUKOCYTESUR Negative 05/31/2014 1146     ----------------------------------------------------------------------------------------------------------------   Imaging Results:    Dg Chest 2 View  Result Date: 04/14/2017 CLINICAL DATA:  Chest pain EXAM: CHEST  2 VIEW COMPARISON:  04/06/2016 FINDINGS: Heart is borderline in size. No confluent opacities, effusions or edema. No acute bony abnormality. IMPRESSION: No active cardiopulmonary disease. Electronically Signed   By: Rolm Baptise M.D.   On: 04/14/2017 22:26       Assessment & Plan:    Principal Problem:   Chest pain Active Problems:   Hypokalemia   Chest pain Tele Trop I q6h x3 Check cardiac echo Aspirin NO b blocker due to relative bradycardia Start Crestor 20mg  po qhs Nitro gtt Heparin iv Repeat EKG 12 lead in am Cardiology consulted  Hypokalemia repleted  Glucose intolerance (hga1c=5.7, 05/02/2016) Check hga1c     DVT Prophylaxis Heparin -  AM Labs Ordered, also please review Full Orders  Family Communication: Admission, patients condition and plan of care including tests being ordered have been discussed with the patient  who indicate understanding and agree with the plan and Code Status.  Code Status FULL CODE  Likely DC to  home  Condition GUARDED    Consults called: cardiology consulted by email  Admission status:  observation  Time spent in minutes :45   Jani Gravel M.D on 04/15/2017 at 12:28 AM  Between 7am to 7pm - Pager - [phone]  . After 7pm go to [email or web address] - password [facility]  [facility] - Office  [phone]

## 2017-04-15 NOTE — Progress Notes (Signed)
[name]  is a 77 y.o. male, w CAD s/p PCI with 2 stents, Hypertension, Hyperlipidemia, CVA 2018 with no residual deficits, had some chest discomfort as well as dyspnea and diaphoresis.  Admitted for chest pain r/o ACS. troponin elevated at 0.13 and trending down to 0.10. Cardiology consulted.  04/15/17: Patient seen and examined at his bedside in the ED [facility] Chest pain is improving. cardiology consulted. Heart cath planned tomorrow.  Please refer to H&P dictated by Dr Maudie Mercury on 04/15/17 for further details of assessment and plan.

## 2017-04-15 NOTE — Plan of Care (Signed)
  Pain Managment: General experience of comfort will improve 04/15/2017 1831 - Progressing by Shanon Rosser, RN

## 2017-04-15 NOTE — H&P (View-Only) (Signed)
Cardiology Consultation:   Patient ID: [name]; [id number]; May 26, 1940   Admit date: 04/14/2017 Date of Consult: 04/15/2017  Primary Care Provider: Sherren Mocha, MD Primary Cardiologist: Dr. Burt Foster    Patient Profile:   [name] is a 77 y.o. male with a hx of CAD s/p BMS to LCx & OM 2008, difficult to control HTN, HLD and prior ischemic stroke who is being seen today for the evaluation of chest pain at the request of Dr. Maudie Foster.   He is s/p PCI in 2008 with BMS to the LCx and OM when he presented with [place]. He had residual diffuse disease in LAD and RCA. This was treated medically. He was admitted in 3/18 wit an ischemic CVA.  He was DC on dual antiplatelet Rx for 3 mos and then Clopidogrel alone.  He was doing well on cardiac stand point when last seen by Dr. Burt Foster 11/2016.  History of Present Illness:   [name] patient did by EMS for chest pain.  Patient was in usual state of health up until Saturday morning at 6 AM when he woke up with a severe substernal chest pressure radiating to left arm.  His symptoms lasted for 1-2 hours.  He took aspirin 81 mg x4 with eventually reevaluation of pain.  He was fine and doing usual activity up until Sunday evening when he again had substernal chest pressure.  He was watching TV.  This episode was more severe and intense than prior episode.  He took aspirin 81 mg x4 without improvement.  EMS was called.  Given sublingual nitroglycerin x 1 with minimal improvement.  He was started on IV nitroglycerin in ER with complete resolution of pain.  No recurrence.  Troponin minimally elevated.  Started on IV heparin.  EKG shows sinus rhythm with nonspecific T wave changes in inferior leads, somewhat similar to prior EKG-personally reviewed.  Potassium 3 on admission now improved to 3.5.  Serum creatinine normal.  LDL 126.  Chest x-ray clear.  Patient endorsed compliant with medication.  No regular exercise.  He denies orthopnea, PND,  syncope, lower extremity edema or melena.  Blood pressure stable.  Coreg held on admission due to relative bradycardia.  Past Medical History:  Diagnosis Date  . Blurred vision 05/17/2016  . Chest pain 04/16/2012  . Coronary artery disease   . Coronary artery disease due to lipid rich plaque 06/14/2008   Qualifier: Diagnosis of  By: [name], RMA, [name]    . Diplopia   . Essential hypertension 05/17/2016  . Hyperlipidemia with target low density lipoprotein (LDL) cholesterol less than 70 mg/dL 06/14/2008   Qualifier: Diagnosis of  By: [name], RMA, [name]    . Hypertension   . Hypertensive heart disease 06/14/2008   Qualifier: Diagnosis of  By: [name], [name], [name]    . Hypertensive urgency, malignant 04/16/2012  . Internal hemorrhoids 04/19/2012  . Ischemic stroke ([name]) 05/02/2016  . pandiverticulosis 04/22/2012   12/17/2011. [place]. Gerald Craver MD. Colonoscopy. Moderate sized internal hemorrhoids and extensive pandiverticulosis. Repeat 5 years.    . Polycythemia vera(238.4) 04/17/2012  . Syncope and collapse 04/17/2012   Pt syncopized while sitting in bed giving history @ time of admission to hospital Tachycardic (appeared sinus) to 120s-130s and hypotensive to 50s/30s.  Unresponsive initially >> Spontaneously resolved after 2-3 minutes >> return to baseline ~30 minutes   . Vertigo     Past Surgical History:  Procedure Laterality Date  . CORONARY STENT PLACEMENT  Inpatient Medications: Scheduled Meds: . aspirin EC  81 mg Oral Daily  . hydrochlorothiazide  25 mg Oral Daily  . irbesartan  300 mg Oral Daily   Continuous Infusions: . heparin 1,100 Units/hr (04/15/17 0735)  . nitroGLYCERIN 25 mcg/min (04/15/17 0734)   PRN Meds: acetaminophen **OR** acetaminophen  Allergies:    Allergies  Allergen Reactions  . Lipitor [Atorvastatin] Shortness Of Breath  . Pork-Derived Products Other (See Comments)    No pork products - unspecified    Social History:     Social History   Socioeconomic History  . Marital status: Married    Spouse name: Not on file  . Number of children: Not on file  . Years of education: Not on file  . Highest education level: Not on file  Social Needs  . Financial resource strain: Not on file  . Food insecurity - worry: Not on file  . Food insecurity - inability: Not on file  . Transportation needs - medical: Not on file  . Transportation needs - non-medical: Not on file  Occupational History  . Not on file  Tobacco Use  . Smoking status: Never Smoker  . Smokeless tobacco: Never Used  Substance and Sexual Activity  . Alcohol use: No    Alcohol/week: 0.0 oz  . Drug use: No  . Sexual activity: Not on file  Other Topics Concern  . Not on file  Social History Narrative   Lives in [place] with Wife and 2 sons.  From [place].  To [place] ~2000.     Currently retired but worked in Veterinary surgeon    Family History:   Family History  Problem Relation Age of Onset  . Heart disease Mother   . Cancer Sister      ROS:  Please see the history of present illness.  All other ROS reviewed and negative.     Physical Exam/Data:   Vitals:   04/15/17 0445 04/15/17 0500 04/15/17 0545 04/15/17 0630  BP: (!) 114/97 128/74 128/72 129/68  Pulse: (!) 57 66 67 (!) 55  Resp: 16 18 20 16   Temp:      TempSrc:      SpO2: 92% 93% 93% 94%  Weight:      Height:       No intake or output data in the 24 hours ending 04/15/17 0939 Filed Weights   04/15/17 0100  Weight: 191 lb 5.8 oz (86.8 kg)   Body mass index is 30.89 kg/m.  General:  Well nourished, well developed, in no acute distress HEENT: normal Lymph: no adenopathy Neck: no JVD Endocrine:  No thryomegaly Vascular: No carotid bruits; FA pulses 2+ bilaterally without bruits  Cardiac:  normal S1, S2; RRR; no murmur  Lungs:  clear to auscultation bilaterally, no wheezing, rhonchi or rales  Abd: soft, nontender, no hepatomegaly  Ext: no  edema Musculoskeletal:  No deformities, BUE and BLE strength normal and equal Skin: warm and dry  Neuro:  CNs 2-12 intact, no focal abnormalities noted Psych:  Normal affect   Telemetry:  Telemetry was personally reviewed and demonstrates: Sinus bradycardia at rate of 50s  Relevant CV Studies: Cath 04/03/2006  DESCRIPTION OF PROCEDURE:  The risks and benefits of catheterization  were explained, consent was signed and placed on the chart.  A 6-French  sheath was placed in the right femoral artery using a modified Seldinger  technique.  Standard catheters including JL-4, JR-4, and angled pigtail  were used for the procedure. All catheter  exchanges were made over a  wire.  There were no apparent complications.   Central aortic pressure is 173/92 with a mean of 127.  LV pressure is  163/0 with an EDP of 12.  There was no aortic stenosis.   The left main had a mild irregularity.   The LAD was a long vessel coursing to the apex.  It gave off a small  first diagonal and a medium sized second diagonal.  The LAD was  diffusely diseased throughout its course.  There was a 50% ostial  lesion, a diffuse long 80% proximal lesion, and distally was diffusely  diseased at 60-70%.   The left circumflex was made up primarily of a large branching OM1.  There was a 99% lesion in the small AV groove circumflex right after the  takeoff of the OM1. In the OM1, there was a tandem 70-80% lesion  proximally and a 90% lesion more distally.   The right coronary artery was a dominant vessel.  It had diffuse mild  disease of 30% proximally.  It gave off a moderate sized PDA and two  posterolaterals. In the ostium of the PDA, there was a 90% focal lesion.   The left ventriculogram was done in the RAO position.  EF was a bit hard  to determine due to ectopy, it appeared to be a 70% with no obvious wall  motion abnormalities.   Abdominal aortogram was done due to severe hypertension and to rule out   renal artery stenosis and showed a normal abdominal aorta with patent  renal arteries bilaterally.   ASSESSMENT:  1. Three vessel coronary artery disease as described above with a      diffusely diseased LAD without good targets.  2. Normal LV function.  3. Severe hypertension with normal renal arteries.   PLAN/DISCUSSION:  I have reviewed the films with Dr. Olevia Perches.  We will  plan a percutaneous intervention on both the left circumflex lesions  tomorrow.  He will be started on Plavix tonight.  Cath 04/14/2006 PROCEDURAL TECHNIQUE:  Informed consent was obtained.  The patient had  been pretreated for his dye allergy with corticosteroids and IV Pepcid.  In addition, he was preloaded with aspirin, Plavix, and high-potency  statin.   Anticoagulation was initiated with bivalirudin.  ACT was confirmed to be  greater than 225 seconds.  A  VL3.5 guide was advanced over a wire and  engaged in the ostium of the left main.  A Prowater wire was advanced to  the distal circumflex without difficulty.  The lesion of the OM was  directly stented using a 3.0 x 12 mm Liberte stent deployed at 16  atmospheres.  The lesion of the proximal circumflex was then also  stented using a 3.0 x 12 mm Liberte also at 16 atmospheres.  I then  postdilated both lesions using a 3.25 x 12 mm Quantum at 22 atmospheres.  The high pressure was necessary to resolve a waist in the most severely  narrowed portions of both the lesions.  Residual stenosis in the  proximal lesion was 0% and in the marginal was 10%.  Final angiography  demonstrated no dissection and TIMI-III flow distally.   The arteriotomy was then closed using an Angio-Seal device.  Complete  hemostasis was obtained.  The patient was then transferred to the  holding room in stable condition.   COMPLICATIONS:  None.   IMPRESSION/PLAN:  Successful percutaneous revascularization of two  lesions in the circumflex.  Due  to his acute coronary syndrome,  I  would  prefer that he remain on Plavix for a year.  At minimum, this should be  continued for 30 days.  Aspirin should be continued indefinitely.  We  will work on blood pressure control with ACE inhibitor.  Statin was  initiated.    Laboratory Data:  Chemistry Recent Labs  Lab 04/14/17 2150 04/15/17 0604  NA 138 137  K 3.0* 3.5  CL 107 103  CO2 21* 24  GLUCOSE 150* 125*  BUN 16 13  CREATININE 0.84 0.85  CALCIUM 7.8* 8.6*  GFRNONAA >60 >60  GFRAA >60 >60  ANIONGAP 10 10    Recent Labs  Lab 04/15/17 0604  PROT 6.1*  ALBUMIN 3.2*  AST 31  ALT 17  ALKPHOS 55  BILITOT 1.0   Hematology Recent Labs  Lab 04/14/17 2150 04/15/17 0604  WBC 7.0 8.2  RBC 4.50 4.41  HGB 14.3 13.6  HCT 40.7 40.0  MCV 90.4 90.7  MCH 31.8 30.8  MCHC 35.1 34.0  RDW 13.8 13.7  PLT 205 196   Cardiac Enzymes Recent Labs  Lab 04/15/17 0036 04/15/17 0604  TROPONINI 0.14* 0.13*    Recent Labs  Lab 04/14/17 2201  TROPIPOC 0.06     Radiology/Studies:  Dg Chest 2 View  Result Date: 04/14/2017 CLINICAL DATA:  Chest pain EXAM: CHEST  2 VIEW COMPARISON:  04/06/2016 FINDINGS: Heart is borderline in size. No confluent opacities, effusions or edema. No acute bony abnormality. IMPRESSION: No active cardiopulmonary disease. Electronically Signed   By: Rolm Baptise M.D.   On: 04/14/2017 22:26    Assessment and Plan:   1. Unstable angina  -Symptoms similar to prior PCI.  First episode Saturday morning with reoccurrence Sunday evening (more intense and severe).  Patient currently chest pain-free on IV nitroglycerin and IV heparin. -Troponin minimally elevated 0.06-->0.14-0->0.13. -Continue aspirin.  Patient will need cardiac catheterization for definite evaluation of anatomic.  Cath board is full today. Will do cath tomorrow. He can eat today.   The patient understands that risks include but are not limited to stroke (1 in 1000), death (1 in 92), kidney failure [usually temporary] (1  in 500), bleeding (1 in 200), allergic reaction [possibly serious] (1 in 200), and agrees to proceed.   2. CAD  - history of stenting with bare-metal stent to circumflex and OM in 2008.  Patient has a residual disease that was treated medically.  3.  Hyperlipidemia - 04/15/2017: Cholesterol 174; HDL 39; LDL Cholesterol 126; Triglycerides 46; VLDL 9  -Lipitor listed as allergy however patient is not able to provide any information. LDL goal less than 70. -Will start on Crestor 40 mg daily.  Repeat lipid panel and LFT in 4-6 weeks.  4.  Hypertension -Blood pressure relatively controlled on current medication.  Coreg held on admission due to bradycardia.  Rate sustaining in 21s.  5. Hypokalemia - Resolved   For questions or updates, please contact [place] Please consult [email or web address] for contact info under Cardiology/STEMI.   Jarrett Soho, [place]  04/15/2017 9:39 AM   I have examined the patient and reviewed assessment and plan and discussed with patient.  Agree with above as stated.  Patient with CAD and unstable angina sx.  Low level troponin elevation present.  High degree of suspicion for recurrent CAD.  Plan for cath tomorrow with Dr. Burt Foster.  He is pain free at this time.  Continue IV heparin.  LDL reduction with statin. Cardiac catheterization  was discussed with the patient fully. The patient understands that risks include but are not limited to stroke (1 in 1000), death (1 in 67), kidney failure [usually temporary] (1 in 500), bleeding (1 in 200), allergic reaction [possibly serious] (1 in 200).  The patient understands and is willing to proceed.   All questions answered.       Larae Grooms

## 2017-04-15 NOTE — Progress Notes (Signed)
[place] for heparin Indication: chest pain/ACS  Allergies  Allergen Reactions  . Lipitor [Atorvastatin] Shortness Of Breath  . Pork-Derived Products Other (See Comments)    No pork products - unspecified    Patient Measurements: Height: 5\' 6"  (167.6 cm) Weight: 191 lb 5.8 oz (86.8 kg) IBW/kg (Calculated) : 63.8 Heparin Dosing Weight: 80kg  Vital Signs: BP: 130/74 (02/25 1147) Pulse Rate: 62 (02/25 1147)  Labs: Recent Labs    04/14/17 2150 04/15/17 0036 04/15/17 0604 04/15/17 1100  HGB 14.3  --  13.6  --   HCT 40.7  --  40.0  --   PLT 205  --  196  --   HEPARINUNFRC  --   --   --  0.16*  CREATININE 0.84  --  0.85  --   TROPONINI  --  0.14* 0.13*  --     Estimated Creatinine Clearance: 78.7 mL/min (by C-G formula based on SCr of 0.85 mg/dL).   Medical History: Past Medical History:  Diagnosis Date  . Blurred vision 05/17/2016  . Chest pain 04/16/2012  . Coronary artery disease   . Coronary artery disease due to lipid rich plaque 06/14/2008   Qualifier: Diagnosis of  By: Mare Ferrari, RMA, [name]    . Diplopia   . Essential hypertension 05/17/2016  . Hyperlipidemia with target low density lipoprotein (LDL) cholesterol less than 70 mg/dL 06/14/2008   Qualifier: Diagnosis of  By: Mare Ferrari, RMA, [name]    . Hypertension   . Hypertensive heart disease 06/14/2008   Qualifier: Diagnosis of  By: Mare Ferrari, [place], [name]    . Hypertensive urgency, malignant 04/16/2012  . Internal hemorrhoids 04/19/2012  . Ischemic stroke ([place]) 05/02/2016  . pandiverticulosis 04/22/2012   12/17/2011. [place]. Juanita Craver MD. Colonoscopy. Moderate sized internal hemorrhoids and extensive pandiverticulosis. Repeat 5 years.    . Polycythemia vera(238.4) 04/17/2012  . Syncope and collapse 04/17/2012   Pt syncopized while sitting in bed giving history @ time of admission to hospital Tachycardic (appeared sinus) to 120s-130s and hypotensive to 50s/30s.   Unresponsive initially >> Spontaneously resolved after 2-3 minutes >> return to baseline ~30 minutes   . Vertigo     Assessment: 77yo male c/o crushing CP radiating to LUE and associated w/ SOB and diaphoresis, istat troponin negative, to begin heparin.  Initial heparin low at 0.16. Cath scheduled for 2/26. CBC stable. No bleeding or IV line issues per RN and drip has not been off at all.  Goal of Therapy:  Heparin level 0.3-0.7 units/ml Monitor platelets by anticoagulation protocol: Yes   Plan:  Increase heparin gtt to 1400 units/hr 8h heparin level Daily heparin level/CBC Monitor s/sx bleeding   Elicia Lamp, PharmD, BCPS Clinical Pharmacist Clinical phone for 04/15/2017 until 3:30pm: [phone] If after 3:30pm, please call main pharmacy at: [phone] 04/15/2017 12:41 PM

## 2017-04-15 NOTE — Consult Note (Addendum)
Cardiology Consultation:   Patient ID: [name]; [id number]; 1941/02/03   Admit date: 04/14/2017 Date of Consult: 04/15/2017  Primary Care Provider: Sherren Mocha, Foster Primary Cardiologist: Dr. Burt Foster    Patient Profile:   [name] is a 77 y.o. male with a hx of CAD s/p BMS to LCx & OM 2008, difficult to control HTN, HLD and prior ischemic stroke who is being seen today for the evaluation of chest pain at the request of [name].   He is s/p PCI in 2008 with BMS to the LCx and OM when he presented with [place]. He had residual diffuse disease in LAD and RCA. This was treated medically. He was admitted in 3/18 wit an ischemic CVA.  He was DC on dual antiplatelet Rx for 3 mos and then Clopidogrel alone.  He was doing well on cardiac stand point when last seen by Dr. Burt Foster 11/2016.  History of Present Illness:   [name] patient did by EMS for chest pain.  Patient was in usual state of health up until Saturday morning at 6 AM when he woke up with a severe substernal chest pressure radiating to left arm.  His symptoms lasted for 1-2 hours.  He took aspirin 81 mg x4 with eventually reevaluation of pain.  He was fine and doing usual activity up until Sunday evening when he again had substernal chest pressure.  He was watching TV.  This episode was more severe and intense than prior episode.  He took aspirin 81 mg x4 without improvement.  EMS was called.  Given sublingual nitroglycerin x 1 with minimal improvement.  He was started on IV nitroglycerin in ER with complete resolution of pain.  No recurrence.  Troponin minimally elevated.  Started on IV heparin.  EKG shows sinus rhythm with nonspecific T wave changes in inferior leads, somewhat similar to prior EKG-personally reviewed.  Potassium 3 on admission now improved to 3.5.  Serum creatinine normal.  LDL 126.  Chest x-ray clear.  Patient endorsed compliant with medication.  No regular exercise.  He denies orthopnea, PND,  syncope, lower extremity edema or melena.  Blood pressure stable.  Coreg held on admission due to relative bradycardia.  Past Medical History:  Diagnosis Date  . Blurred vision 05/17/2016  . Chest pain 04/16/2012  . Coronary artery disease   . Coronary artery disease due to lipid rich plaque 06/14/2008   Qualifier: Diagnosis of  By: [name], [name], [name]    . Diplopia   . Essential hypertension 05/17/2016  . Hyperlipidemia with target low density lipoprotein (LDL) cholesterol less than 70 mg/dL 06/14/2008   Qualifier: Diagnosis of  By: [name], [name], [name]    . Hypertension   . Hypertensive heart disease 06/14/2008   Qualifier: Diagnosis of  By: [name], [name], [name]    . Hypertensive urgency, malignant 04/16/2012  . Internal hemorrhoids 04/19/2012  . Ischemic stroke ([place]) 05/02/2016  . pandiverticulosis 04/22/2012   12/17/2011. [name]. Colonoscopy. Moderate sized internal hemorrhoids and extensive pandiverticulosis. Repeat 5 years.    . Polycythemia vera(238.4) 04/17/2012  . Syncope and collapse 04/17/2012   Pt syncopized while sitting in bed giving history @ time of admission to hospital Tachycardic (appeared sinus) to 120s-130s and hypotensive to 50s/30s.  Unresponsive initially >> Spontaneously resolved after 2-3 minutes >> return to baseline ~30 minutes   . Vertigo     Past Surgical History:  Procedure Laterality Date  . CORONARY STENT PLACEMENT  Inpatient Medications: Scheduled Meds: . aspirin EC  81 mg Oral Daily  . hydrochlorothiazide  25 mg Oral Daily  . irbesartan  300 mg Oral Daily   Continuous Infusions: . heparin 1,100 Units/hr (04/15/17 0735)  . nitroGLYCERIN 25 mcg/min (04/15/17 0734)   PRN Meds: acetaminophen **OR** acetaminophen  Allergies:    Allergies  Allergen Reactions  . Lipitor [Atorvastatin] Shortness Of Breath  . Pork-Derived Products Other (See Comments)    No pork products - unspecified    Social History:     Social History   Socioeconomic History  . Marital status: Married    Spouse name: Not on file  . Number of children: Not on file  . Years of education: Not on file  . Highest education level: Not on file  Social Needs  . Financial resource strain: Not on file  . Food insecurity - worry: Not on file  . Food insecurity - inability: Not on file  . Transportation needs - medical: Not on file  . Transportation needs - non-medical: Not on file  Occupational History  . Not on file  Tobacco Use  . Smoking status: Never Smoker  . Smokeless tobacco: Never Used  Substance and Sexual Activity  . Alcohol use: No    Alcohol/week: 0.0 oz  . Drug use: No  . Sexual activity: Not on file  Other Topics Concern  . Not on file  Social History Narrative   Lives in [place] with Wife and 2 sons.  From [place].  To [place] ~2000.     Currently retired but worked in Veterinary surgeon    Family History:   Family History  Problem Relation Age of Onset  . Heart disease Mother   . Cancer Sister      ROS:  Please see the history of present illness.  All other ROS reviewed and negative.     Physical Exam/Data:   Vitals:   04/15/17 0445 04/15/17 0500 04/15/17 0545 04/15/17 0630  BP: (!) 114/97 128/74 128/72 129/68  Pulse: (!) 57 66 67 (!) 55  Resp: 16 18 20 16   Temp:      TempSrc:      SpO2: 92% 93% 93% 94%  Weight:      Height:       No intake or output data in the 24 hours ending 04/15/17 0939 Filed Weights   04/15/17 0100  Weight: 191 lb 5.8 oz (86.8 kg)   Body mass index is 30.89 kg/m.  General:  Well nourished, well developed, in no acute distress HEENT: normal Lymph: no adenopathy Neck: no JVD Endocrine:  No thryomegaly Vascular: No carotid bruits; FA pulses 2+ bilaterally without bruits  Cardiac:  normal S1, S2; RRR; no murmur  Lungs:  clear to auscultation bilaterally, no wheezing, rhonchi or rales  Abd: soft, nontender, no hepatomegaly  Ext: no  edema Musculoskeletal:  No deformities, BUE and BLE strength normal and equal Skin: warm and dry  Neuro:  CNs 2-12 intact, no focal abnormalities noted Psych:  Normal affect   Telemetry:  Telemetry was personally reviewed and demonstrates: Sinus bradycardia at rate of 50s  Relevant CV Studies: Cath 04/03/2006  DESCRIPTION OF PROCEDURE:  The risks and benefits of catheterization  were explained, consent was signed and placed on the chart.  A 6-French  sheath was placed in the right femoral artery using a modified Seldinger  technique.  Standard catheters including JL-4, JR-4, and angled pigtail  were used for the procedure. All catheter  exchanges were made over a  wire.  There were no apparent complications.   Central aortic pressure is 173/92 with a mean of 127.  LV pressure is  163/0 with an EDP of 12.  There was no aortic stenosis.   The left main had a mild irregularity.   The LAD was a long vessel coursing to the apex.  It gave off a small  first diagonal and a medium sized second diagonal.  The LAD was  diffusely diseased throughout its course.  There was a 50% ostial  lesion, a diffuse long 80% proximal lesion, and distally was diffusely  diseased at 60-70%.   The left circumflex was made up primarily of a large branching OM1.  There was a 99% lesion in the small AV groove circumflex right after the  takeoff of the OM1. In the OM1, there was a tandem 70-80% lesion  proximally and a 90% lesion more distally.   The right coronary artery was a dominant vessel.  It had diffuse mild  disease of 30% proximally.  It gave off a moderate sized PDA and two  posterolaterals. In the ostium of the PDA, there was a 90% focal lesion.   The left ventriculogram was done in the RAO position.  EF was a bit hard  to determine due to ectopy, it appeared to be a 70% with no obvious wall  motion abnormalities.   Abdominal aortogram was done due to severe hypertension and to rule out   renal artery stenosis and showed a normal abdominal aorta with patent  renal arteries bilaterally.   ASSESSMENT:  1. Three vessel coronary artery disease as described above with a      diffusely diseased LAD without good targets.  2. Normal LV function.  3. Severe hypertension with normal renal arteries.   PLAN/DISCUSSION:  I have reviewed the films with [name].  We will  plan a percutaneous intervention on both the left circumflex lesions  tomorrow.  He will be started on Plavix tonight.  Cath 04/14/2006 PROCEDURAL TECHNIQUE:  Informed consent was obtained.  The patient had  been pretreated for his dye allergy with corticosteroids and IV Pepcid.  In addition, he was preloaded with aspirin, Plavix, and high-potency  statin.   Anticoagulation was initiated with bivalirudin.  ACT was confirmed to be  greater than 225 seconds.  A  VL3.5 guide was advanced over a wire and  engaged in the ostium of the left main.  A Prowater wire was advanced to  the distal circumflex without difficulty.  The lesion of the OM was  directly stented using a 3.0 x 12 mm Liberte stent deployed at 16  atmospheres.  The lesion of the proximal circumflex was then also  stented using a 3.0 x 12 mm Liberte also at 16 atmospheres.  I then  postdilated both lesions using a 3.25 x 12 mm Quantum at 22 atmospheres.  The high pressure was necessary to resolve a waist in the most severely  narrowed portions of both the lesions.  Residual stenosis in the  proximal lesion was 0% and in the marginal was 10%.  Final angiography  demonstrated no dissection and TIMI-III flow distally.   The arteriotomy was then closed using an Angio-Seal device.  Complete  hemostasis was obtained.  The patient was then transferred to the  holding room in stable condition.   COMPLICATIONS:  None.   IMPRESSION/PLAN:  Successful percutaneous revascularization of two  lesions in the circumflex.  Due  to his acute coronary syndrome,  I  would  prefer that he remain on Plavix for a year.  At minimum, this should be  continued for 30 days.  Aspirin should be continued indefinitely.  We  will work on blood pressure control with ACE inhibitor.  Statin was  initiated.    Laboratory Data:  Chemistry Recent Labs  Lab 04/14/17 2150 04/15/17 0604  NA 138 137  K 3.0* 3.5  CL 107 103  CO2 21* 24  GLUCOSE 150* 125*  BUN 16 13  CREATININE 0.84 0.85  CALCIUM 7.8* 8.6*  GFRNONAA >60 >60  GFRAA >60 >60  ANIONGAP 10 10    Recent Labs  Lab 04/15/17 0604  PROT 6.1*  ALBUMIN 3.2*  AST 31  ALT 17  ALKPHOS 55  BILITOT 1.0   Hematology Recent Labs  Lab 04/14/17 2150 04/15/17 0604  WBC 7.0 8.2  RBC 4.50 4.41  HGB 14.3 13.6  HCT 40.7 40.0  MCV 90.4 90.7  MCH 31.8 30.8  MCHC 35.1 34.0  RDW 13.8 13.7  PLT 205 196   Cardiac Enzymes Recent Labs  Lab 04/15/17 0036 04/15/17 0604  TROPONINI 0.14* 0.13*    Recent Labs  Lab 04/14/17 2201  TROPIPOC 0.06     Radiology/Studies:  Dg Chest 2 View  Result Date: 04/14/2017 CLINICAL DATA:  Chest pain EXAM: CHEST  2 VIEW COMPARISON:  04/06/2016 FINDINGS: Heart is borderline in size. No confluent opacities, effusions or edema. No acute bony abnormality. IMPRESSION: No active cardiopulmonary disease. Electronically Signed   By: Gerald Baptise M.D.   On: 04/14/2017 22:26    Assessment and Plan:   1. Unstable angina  -Symptoms similar to prior PCI.  First episode Saturday morning with reoccurrence Sunday evening (more intense and severe).  Patient currently chest pain-free on IV nitroglycerin and IV heparin. -Troponin minimally elevated 0.06-->0.14-0->0.13. -Continue aspirin.  Patient will need cardiac catheterization for definite evaluation of anatomic.  Cath board is full today. Will do cath tomorrow. He can eat today.   The patient understands that risks include but are not limited to stroke (1 in 1000), death (1 in 14), kidney failure [usually temporary] (1  in 500), bleeding (1 in 200), allergic reaction [possibly serious] (1 in 200), and agrees to proceed.   2. CAD  - history of stenting with bare-metal stent to circumflex and OM in 2008.  Patient has a residual disease that was treated medically.  3.  Hyperlipidemia - 04/15/2017: Cholesterol 174; HDL 39; LDL Cholesterol 126; Triglycerides 46; VLDL 9  -Lipitor listed as allergy however patient is not able to provide any information. LDL goal less than 70. -Will start on Crestor 40 mg daily.  Repeat lipid panel and LFT in 4-6 weeks.  4.  Hypertension -Blood pressure relatively controlled on current medication.  Coreg held on admission due to bradycardia.  Rate sustaining in 57s.  5. Hypokalemia - Resolved   For questions or updates, please contact [place] Please consult [email or web address] for contact info under Cardiology/STEMI.   [name], [name]  04/15/2017 9:39 AM   I have examined the patient and reviewed assessment and plan and discussed with patient.  Agree with above as stated.  Patient with CAD and unstable angina sx.  Low level troponin elevation present.  High degree of suspicion for recurrent CAD.  Plan for cath tomorrow with Dr. Burt Foster.  He is pain free at this time.  Continue IV heparin.  LDL reduction with statin. Cardiac catheterization  was discussed with the patient fully. The patient understands that risks include but are not limited to stroke (1 in 1000), death (1 in 13), kidney failure [usually temporary] (1 in 500), bleeding (1 in 200), allergic reaction [possibly serious] (1 in 200).  The patient understands and is willing to proceed.   All questions answered.       [name]

## 2017-04-15 NOTE — Progress Notes (Signed)
[place] for heparin Indication: chest pain/ACS  Allergies  Allergen Reactions  . Lipitor [Atorvastatin] Shortness Of Breath  . Pork-Derived Products Other (See Comments)    No pork products - unspecified    Patient Measurements: Height: 5\' 6"  (167.6 cm) Weight: 195 lb 9.6 oz (88.7 kg) IBW/kg (Calculated) : 63.8 Heparin Dosing Weight: 80kg  Vital Signs: Temp: 97.9 F (36.6 C) (02/25 1930) Temp Source: Oral (02/25 1930) BP: 144/76 (02/25 1930) Pulse Rate: 83 (02/25 1930)  Labs: Recent Labs    04/14/17 2150 04/15/17 0036 04/15/17 0604 04/15/17 1100 04/15/17 1320 04/15/17 2047  HGB 14.3  --  13.6  --   --   --   HCT 40.7  --  40.0  --   --   --   PLT 205  --  196  --   --   --   HEPARINUNFRC  --   --   --  0.16*  --  0.39  CREATININE 0.84  --  0.85  --   --   --   TROPONINI  --  0.14* 0.13*  --  0.10*  --     Estimated Creatinine Clearance: 79.6 mL/min (by C-G formula based on SCr of 0.85 mg/dL).   Medical History: Past Medical History:  Diagnosis Date  . Blurred vision 05/17/2016  . Chest pain 04/16/2012  . Coronary artery disease   . Coronary artery disease due to lipid rich plaque 06/14/2008   Qualifier: Diagnosis of  By: Mare Ferrari, RMA, [name]    . Diplopia   . Essential hypertension 05/17/2016  . Hyperlipidemia with target low density lipoprotein (LDL) cholesterol less than 70 mg/dL 06/14/2008   Qualifier: Diagnosis of  By: Mare Ferrari, RMA, [name]    . Hypertension   . Hypertensive heart disease 06/14/2008   Qualifier: Diagnosis of  By: Mare Ferrari, [place], [name]    . Hypertensive urgency, malignant 04/16/2012  . Internal hemorrhoids 04/19/2012  . Ischemic stroke ([place]) 05/02/2016  . pandiverticulosis 04/22/2012   12/17/2011. [place]. Juanita Craver MD. Colonoscopy. Moderate sized internal hemorrhoids and extensive pandiverticulosis. Repeat 5 years.    . Polycythemia vera(238.4) 04/17/2012  . Syncope and collapse  04/17/2012   Pt syncopized while sitting in bed giving history @ time of admission to hospital Tachycardic (appeared sinus) to 120s-130s and hypotensive to 50s/30s.  Unresponsive initially >> Spontaneously resolved after 2-3 minutes >> return to baseline ~30 minutes   . Vertigo     Assessment: 77yo male c/o crushing CP radiating to LUE and associated w/ SOB and diaphoresis, istat troponin negative, started on Heparin for anticoagulation.   Heparin level this evening is therapeutic after a rate increase earlier today (HL 0.39 << 0.16, goal of 0.3-0.7).  No bleeding noted. Cath scheduled for 2/26.   Goal of Therapy:  Heparin level 0.3-0.7 units/ml Monitor platelets by anticoagulation protocol: Yes   Plan:  1. Continue Heparin at 1400 units/hr 2. Will continue to monitor for any signs/symptoms of bleeding and will follow up with heparin level in 8 hours to confirm therapeutic  Thank you for allowing pharmacy to be a part of this patient's care.  Alycia Rossetti, PharmD, BCPS Clinical Pharmacist Pager: [phone] 04/15/2017 9:58 PM

## 2017-04-16 ENCOUNTER — Other Ambulatory Visit: Payer: Self-pay | Admitting: *Deleted

## 2017-04-16 ENCOUNTER — Other Ambulatory Visit (HOSPITAL_COMMUNITY): Payer: Medicare HMO

## 2017-04-16 ENCOUNTER — Other Ambulatory Visit: Payer: Self-pay

## 2017-04-16 ENCOUNTER — Inpatient Hospital Stay (HOSPITAL_COMMUNITY): Payer: Medicare HMO

## 2017-04-16 ENCOUNTER — Encounter (HOSPITAL_COMMUNITY): Payer: Self-pay | Admitting: Cardiovascular Disease

## 2017-04-16 ENCOUNTER — Inpatient Hospital Stay (HOSPITAL_COMMUNITY)
Admission: EM | Disposition: A | Discharge status: Home Health | Payer: Self-pay | Source: Home / Self Care | Attending: Cardiothoracic Surgery

## 2017-04-16 DIAGNOSIS — Z01811 Encounter for preprocedural respiratory examination: Secondary | ICD-10-CM

## 2017-04-16 DIAGNOSIS — E785 Hyperlipidemia, unspecified: Secondary | ICD-10-CM

## 2017-04-16 DIAGNOSIS — I10 Essential (primary) hypertension: Secondary | ICD-10-CM

## 2017-04-16 DIAGNOSIS — E1169 Type 2 diabetes mellitus with other specified complication: Secondary | ICD-10-CM

## 2017-04-16 DIAGNOSIS — I25110 Atherosclerotic heart disease of native coronary artery with unstable angina pectoris: Secondary | ICD-10-CM

## 2017-04-16 DIAGNOSIS — I2510 Atherosclerotic heart disease of native coronary artery without angina pectoris: Secondary | ICD-10-CM

## 2017-04-16 DIAGNOSIS — Z01810 Encounter for preprocedural cardiovascular examination: Secondary | ICD-10-CM

## 2017-04-16 DIAGNOSIS — I25118 Atherosclerotic heart disease of native coronary artery with other forms of angina pectoris: Secondary | ICD-10-CM

## 2017-04-16 DIAGNOSIS — I214 Non-ST elevation (NSTEMI) myocardial infarction: Secondary | ICD-10-CM

## 2017-04-16 HISTORY — PX: LEFT HEART CATH AND CORONARY ANGIOGRAPHY: CATH118249

## 2017-04-16 LAB — BLOOD GAS, ARTERIAL
Acid-Base Excess: 4.1 mmol/L — ABNORMAL HIGH (ref 0.0–2.0)
Bicarbonate: 27.3 mmol/L (ref 20.0–28.0)
Drawn by: 52078
FIO2: 21
O2 Saturation: 94.7 %
Patient temperature: 98.6
pCO2 arterial: 35.4 mmHg (ref 32.0–48.0)
pH, Arterial: 7.499 — ABNORMAL HIGH (ref 7.350–7.450)
pO2, Arterial: 69.6 mmHg — ABNORMAL LOW (ref 83.0–108.0)

## 2017-04-16 LAB — ABO/RH: ABO/RH(D): A POS

## 2017-04-16 LAB — HEMOGLOBIN A1C
Hgb A1c MFr Bld: 5.9 % — ABNORMAL HIGH (ref 4.8–5.6)
MEAN PLASMA GLUCOSE: 123 mg/dL

## 2017-04-16 LAB — URINALYSIS, ROUTINE W REFLEX MICROSCOPIC
Bilirubin Urine: NEGATIVE
Glucose, UA: NEGATIVE mg/dL
Hgb urine dipstick: NEGATIVE
Ketones, ur: NEGATIVE mg/dL
Leukocytes, UA: NEGATIVE
Nitrite: NEGATIVE
Protein, ur: NEGATIVE mg/dL
Specific Gravity, Urine: 1.009 (ref 1.005–1.030)
pH: 8 (ref 5.0–8.0)

## 2017-04-16 LAB — CBC
HCT: 39.5 % (ref 39.0–52.0)
Hemoglobin: 13.4 g/dL (ref 13.0–17.0)
MCH: 30.7 pg (ref 26.0–34.0)
MCHC: 33.9 g/dL (ref 30.0–36.0)
MCV: 90.4 fL (ref 78.0–100.0)
PLATELETS: 178 10*3/uL (ref 150–400)
RBC: 4.37 MIL/uL (ref 4.22–5.81)
RDW: 13.6 % (ref 11.5–15.5)
WBC: 11.8 10*3/uL — AB (ref 4.0–10.5)

## 2017-04-16 LAB — PULMONARY FUNCTION TEST
FEF 25-75 Pre: 1.64 L/sec
FEF2575-%Pred-Pre: 88 %
FEV1-%Pred-Pre: 70 %
FEV1-Pre: 1.82 L
FEV1FVC-%Pred-Pre: 109 %
FEV6-%Pred-Pre: 68 %
FEV6-Pre: 2.28 L
FEV6FVC-%Pred-Pre: 107 %
FVC-%Pred-Pre: 63 %
FVC-Pre: 2.28 L
Pre FEV1/FVC ratio: 80 %
Pre FEV6/FVC Ratio: 100 %

## 2017-04-16 LAB — GLUCOSE, CAPILLARY: GLUCOSE-CAPILLARY: 135 mg/dL — AB (ref 65–99)

## 2017-04-16 LAB — HEPARIN LEVEL (UNFRACTIONATED): HEPARIN UNFRACTIONATED: 0.45 [IU]/mL (ref 0.30–0.70)

## 2017-04-16 LAB — PROTIME-INR
INR: 1.04
Prothrombin Time: 13.5 seconds (ref 11.4–15.2)

## 2017-04-16 LAB — APTT: aPTT: 72 seconds — ABNORMAL HIGH (ref 24–36)

## 2017-04-16 LAB — PLATELET INHIBITION P2Y12: Platelet Function  P2Y12: 247 [PRU] (ref 194–418)

## 2017-04-16 SURGERY — LEFT HEART CATH AND CORONARY ANGIOGRAPHY
Anesthesia: LOCAL

## 2017-04-16 MED ORDER — VERAPAMIL HCL 2.5 MG/ML IV SOLN
INTRAVENOUS | Status: DC | PRN
  Administered 2017-04-16: 10 mL via INTRA_ARTERIAL

## 2017-04-16 MED ORDER — PLASMA-LYTE 148 IV SOLN
INTRAVENOUS | Status: DC
  Filled 2017-04-16: qty 2.5

## 2017-04-16 MED ORDER — VERAPAMIL HCL 2.5 MG/ML IV SOLN
INTRAVENOUS | Status: AC
  Filled 2017-04-16: qty 2

## 2017-04-16 MED ORDER — HEPARIN (PORCINE) IN NACL 2-0.9 UNIT/ML-% IJ SOLN
INTRAMUSCULAR | Status: AC | PRN
  Administered 2017-04-16 (×2): 500 mL

## 2017-04-16 MED ORDER — MIDAZOLAM HCL 2 MG/2ML IJ SOLN
INTRAMUSCULAR | Status: DC | PRN
  Administered 2017-04-16: 1 mg via INTRAVENOUS

## 2017-04-16 MED ORDER — VANCOMYCIN HCL 10 G IV SOLR
1500.0000 mg | INTRAVENOUS | Status: AC
  Administered 2017-04-17: 1500 mg via INTRAVENOUS
  Filled 2017-04-16: qty 1500

## 2017-04-16 MED ORDER — TRANEXAMIC ACID (OHS) BOLUS VIA INFUSION
15.0000 mg/kg | INTRAVENOUS | Status: AC
  Administered 2017-04-17: 1330.5 mg via INTRAVENOUS
  Filled 2017-04-16: qty 1331

## 2017-04-16 MED ORDER — SODIUM CHLORIDE 0.9% FLUSH
3.0000 mL | Freq: Two times a day (BID) | INTRAVENOUS | Status: DC
  Administered 2017-04-16 (×2): 3 mL via INTRAVENOUS

## 2017-04-16 MED ORDER — SODIUM CHLORIDE 0.9 % IV SOLN
INTRAVENOUS | Status: AC
  Administered 2017-04-17: 1.2 [IU]/h via INTRAVENOUS
  Filled 2017-04-16: qty 1

## 2017-04-16 MED ORDER — CHLORHEXIDINE GLUCONATE CLOTH 2 % EX PADS
6.0000 | MEDICATED_PAD | Freq: Once | CUTANEOUS | Status: AC
  Administered 2017-04-16: 6 via TOPICAL

## 2017-04-16 MED ORDER — TEMAZEPAM 15 MG PO CAPS: 15.0000 mg | ORAL_CAPSULE | Freq: Once | ORAL | Status: DC | PRN

## 2017-04-16 MED ORDER — HEPARIN (PORCINE) IN NACL 2-0.9 UNIT/ML-% IJ SOLN
INTRAMUSCULAR | Status: AC
  Filled 2017-04-16: qty 1000

## 2017-04-16 MED ORDER — SODIUM CHLORIDE 0.9 % IV SOLN: INTRAVENOUS | Status: AC

## 2017-04-16 MED ORDER — SODIUM CHLORIDE 0.9% FLUSH: 3.0000 mL | INTRAVENOUS | Status: DC | PRN

## 2017-04-16 MED ORDER — HEPARIN SODIUM (PORCINE) 1000 UNIT/ML IJ SOLN
INTRAMUSCULAR | Status: AC
  Filled 2017-04-16: qty 1

## 2017-04-16 MED ORDER — ONDANSETRON HCL 4 MG/2ML IJ SOLN: 4.0000 mg | Freq: Four times a day (QID) | INTRAMUSCULAR | Status: DC | PRN

## 2017-04-16 MED ORDER — CHLORHEXIDINE GLUCONATE CLOTH 2 % EX PADS
6.0000 | MEDICATED_PAD | Freq: Once | CUTANEOUS | Status: AC
  Administered 2017-04-17: 6 via TOPICAL

## 2017-04-16 MED ORDER — NITROGLYCERIN IN D5W 200-5 MCG/ML-% IV SOLN
2.0000 ug/min | INTRAVENOUS | Status: DC
  Filled 2017-04-16: qty 250

## 2017-04-16 MED ORDER — IOPAMIDOL (ISOVUE-370) INJECTION 76%
INTRAVENOUS | Status: DC | PRN
  Administered 2017-04-16: 70 mL via INTRAVENOUS

## 2017-04-16 MED ORDER — PHENYLEPHRINE HCL 10 MG/ML IJ SOLN
30.0000 ug/min | INTRAMUSCULAR | Status: AC
  Administered 2017-04-17: 10 ug/min via INTRAVENOUS
  Filled 2017-04-16: qty 2

## 2017-04-16 MED ORDER — TRANEXAMIC ACID 1000 MG/10ML IV SOLN
1.5000 mg/kg/h | INTRAVENOUS | Status: AC
  Administered 2017-04-17: 1.5 mg/kg/h via INTRAVENOUS
  Filled 2017-04-16: qty 25

## 2017-04-16 MED ORDER — SODIUM CHLORIDE 0.9 % IV SOLN
750.0000 mg | INTRAVENOUS | Status: AC
  Administered 2017-04-17: 750 mg via INTRAVENOUS
  Filled 2017-04-16: qty 750

## 2017-04-16 MED ORDER — IOPAMIDOL (ISOVUE-370) INJECTION 76%
INTRAVENOUS | Status: AC
  Filled 2017-04-16: qty 100

## 2017-04-16 MED ORDER — MAGNESIUM SULFATE 50 % IJ SOLN
40.0000 meq | INTRAMUSCULAR | Status: DC
  Filled 2017-04-16: qty 9.85

## 2017-04-16 MED ORDER — DEXMEDETOMIDINE HCL IN NACL 400 MCG/100ML IV SOLN
0.1000 ug/kg/h | INTRAVENOUS | Status: AC
  Administered 2017-04-17: .2 ug/kg/h via INTRAVENOUS
  Filled 2017-04-16: qty 100

## 2017-04-16 MED ORDER — POTASSIUM CHLORIDE 2 MEQ/ML IV SOLN
80.0000 meq | INTRAVENOUS | Status: DC
  Filled 2017-04-16: qty 40

## 2017-04-16 MED ORDER — LIDOCAINE HCL (PF) 1 % IJ SOLN
INTRAMUSCULAR | Status: DC | PRN
  Administered 2017-04-16: 2 mL

## 2017-04-16 MED ORDER — HEPARIN (PORCINE) IN NACL 100-0.45 UNIT/ML-% IJ SOLN
1400.0000 [IU]/h | INTRAMUSCULAR | Status: DC
Start: 2017-04-16 — End: 2017-04-17
  Administered 2017-04-16 (×2): 1400 [IU]/h via INTRAVENOUS
  Filled 2017-04-16 (×2): qty 250

## 2017-04-16 MED ORDER — MIDAZOLAM HCL 2 MG/2ML IJ SOLN
INTRAMUSCULAR | Status: AC
  Filled 2017-04-16: qty 2

## 2017-04-16 MED ORDER — HEPARIN SODIUM (PORCINE) 1000 UNIT/ML IJ SOLN
INTRAMUSCULAR | Status: DC
  Filled 2017-04-16: qty 30

## 2017-04-16 MED ORDER — SODIUM CHLORIDE 0.9 % IV SOLN
1.5000 g | INTRAVENOUS | Status: AC
  Administered 2017-04-17: 1.5 g via INTRAVENOUS
  Filled 2017-04-16: qty 1.5

## 2017-04-16 MED ORDER — SODIUM CHLORIDE 0.9 % IV SOLN: 250.0000 mL | INTRAVENOUS | Status: DC | PRN

## 2017-04-16 MED ORDER — HEPARIN SODIUM (PORCINE) 1000 UNIT/ML IJ SOLN
INTRAMUSCULAR | Status: DC | PRN
  Administered 2017-04-16: 5000 [IU] via INTRAVENOUS

## 2017-04-16 MED ORDER — BISACODYL 5 MG PO TBEC: 5.0000 mg | DELAYED_RELEASE_TABLET | Freq: Once | ORAL | Status: DC

## 2017-04-16 MED ORDER — TRANEXAMIC ACID (OHS) PUMP PRIME SOLUTION
2.0000 mg/kg | INTRAVENOUS | Status: DC
  Filled 2017-04-16: qty 1.77

## 2017-04-16 MED ORDER — LIDOCAINE HCL 1 % IJ SOLN
INTRAMUSCULAR | Status: AC
  Filled 2017-04-16: qty 20

## 2017-04-16 MED ORDER — POTASSIUM CHLORIDE CRYS ER 20 MEQ PO TBCR
40.0000 meq | EXTENDED_RELEASE_TABLET | Freq: Once | ORAL | Status: AC
  Administered 2017-04-16: 40 meq via ORAL
  Filled 2017-04-16: qty 2

## 2017-04-16 MED ORDER — FENTANYL CITRATE (PF) 100 MCG/2ML IJ SOLN
INTRAMUSCULAR | Status: AC
  Filled 2017-04-16: qty 2

## 2017-04-16 MED ORDER — FENTANYL CITRATE (PF) 100 MCG/2ML IJ SOLN
INTRAMUSCULAR | Status: DC | PRN
  Administered 2017-04-16: 25 ug via INTRAVENOUS

## 2017-04-16 MED ORDER — CHLORHEXIDINE GLUCONATE 0.12 % MT SOLN
15.0000 mL | Freq: Once | OROMUCOSAL | Status: AC
  Administered 2017-04-17: 15 mL via OROMUCOSAL
  Filled 2017-04-16: qty 15

## 2017-04-16 MED ORDER — METOPROLOL TARTRATE 12.5 MG HALF TABLET
12.5000 mg | ORAL_TABLET | Freq: Once | ORAL | Status: AC
  Administered 2017-04-17: 12.5 mg via ORAL
  Filled 2017-04-16: qty 1

## 2017-04-16 MED ORDER — EPINEPHRINE PF 1 MG/ML IJ SOLN
0.0000 ug/min | INTRAVENOUS | Status: DC
  Filled 2017-04-16: qty 4

## 2017-04-16 MED ORDER — DOPAMINE-DEXTROSE 3.2-5 MG/ML-% IV SOLN
0.0000 ug/kg/min | INTRAVENOUS | Status: AC
  Administered 2017-04-17: 3 ug/kg/min via INTRAVENOUS
  Filled 2017-04-16: qty 250

## 2017-04-16 SURGICAL SUPPLY — 14 items
CATH 5FR JL3.5 JR4 ANG PIG MP (CATHETERS) ×1 IMPLANT
CATH INFINITI 5FR AL1 (CATHETERS) ×1 IMPLANT
CATH LAUNCHER 5F EBU3.5 (CATHETERS) ×1 IMPLANT
CATH LAUNCHER 5F JR4 (CATHETERS) ×1 IMPLANT
DEVICE RAD COMP TR BAND LRG (VASCULAR PRODUCTS) ×1 IMPLANT
GLIDESHEATH SLEND SS 6F .021 (SHEATH) ×1 IMPLANT
GUIDEWIRE INQWIRE 1.5J.035X260 (WIRE) IMPLANT
INQWIRE 1.5J .035X260CM (WIRE) ×2
KIT ESSENTIALS PG (KITS) ×1 IMPLANT
KIT HEART LEFT (KITS) ×2 IMPLANT
PACK CARDIAC CATHETERIZATION (CUSTOM PROCEDURE TRAY) ×2 IMPLANT
SYR MEDRAD MARK V 150ML (SYRINGE) ×2 IMPLANT
TRANSDUCER W/STOPCOCK (MISCELLANEOUS) ×2 IMPLANT
TUBING CIL FLEX 10 FLL-RA (TUBING) ×2 IMPLANT

## 2017-04-16 NOTE — Anesthesia Preprocedure Evaluation (Addendum)
Anesthesia Evaluation  Patient identified by MRN, date of birth, ID band Patient awake    Reviewed: Allergy & Precautions, NPO status , Patient's Chart, lab work & pertinent test results  Airway Mallampati: II  TM Distance: >3 FB Neck ROM: Full    Dental  (+) Dental Advisory Given   Pulmonary neg pulmonary ROS,    breath sounds clear to auscultation       Cardiovascular hypertension, Pt. on medications and Pt. on home beta blockers + CAD, + Past MI and + Cardiac Stents   Rhythm:Regular Rate:Normal     Neuro/Psych CVA    GI/Hepatic negative GI ROS, Neg liver ROS,   Endo/Other  negative endocrine ROS  Renal/GU negative Renal ROS     Musculoskeletal   Abdominal   Peds  Hematology negative hematology ROS (+)   Anesthesia Other Findings   Reproductive/Obstetrics                            Lab Results  Component Value Date   WBC 11.8 (H) 04/16/2017   HGB 13.4 04/16/2017   HCT 39.5 04/16/2017   MCV 90.4 04/16/2017   PLT 178 04/16/2017   Lab Results  Component Value Date   CREATININE 0.85 04/15/2017   BUN 13 04/15/2017   NA 137 04/15/2017   K 3.5 04/15/2017   CL 103 04/15/2017   CO2 24 04/15/2017   Lab Results  Component Value Date   INR 1.04 04/16/2017   INR 1.04 05/02/2016   INR 0.93 11/05/2009    Anesthesia Physical Anesthesia Plan  ASA: IV  Anesthesia Plan: General   Post-op Pain Management:    Induction: Intravenous  PONV Risk Score and Plan: 2 and Dexamethasone, Ondansetron and Treatment may vary due to age or medical condition  Airway Management Planned: Oral ETT  Additional Equipment: Arterial line, CVP, PA Cath, TEE and Ultrasound Guidance Line Placement  Intra-op Plan:   Post-operative Plan: Post-operative intubation/ventilation  Informed Consent: I have reviewed the patients History and Physical, chart, labs and discussed the procedure including the  risks, benefits and alternatives for the proposed anesthesia with the patient or authorized representative who has indicated his/her understanding and acceptance.   Dental advisory given  Plan Discussed with: CRNA  Anesthesia Plan Comments:        Anesthesia Quick Evaluation

## 2017-04-16 NOTE — Progress Notes (Signed)
Carotid duplex prelim: 1-39% ICA stenosis. UE Doppler prelim: Bilateral palmar arch- decreases >50% with radial comp, normal with ulnar comp. Pedal artery waveforms WNL. Landry Mellow, RDMS, RVT

## 2017-04-16 NOTE — Progress Notes (Signed)
RT  TR band removed at 1210pm. Site level zero. Dressing placed lightly reinforced with coban.

## 2017-04-16 NOTE — Consult Note (Signed)
[name]       [name]             [phone]        [name] [name] Date of Birth: 1940/05/21  Referring:Dr Burt Knack Primary Care: Sherren Mocha, MD Primary Cardiologist:No primary care provider on file.  Chief Complaint:    Chief Complaint  Patient presents with  . Chest Pain    History of Present Illness:     [name] is a 77 year old male patient with a significant past medical history for hypertension, hyperlipidemia, coronary artery disease status post BMS to the left circumflex and OM in 2008, cerebrovascular accident in 2018 who presented to [facility] with chest discomfort.  The discomfort began a few days ago and then progressed into chest pressure which was located substernally and radiated to the left arm.  There was associated dyspnea and diaphoresis.  He presented to [facility] on April 15, 2017.  He was started on IV nitroglycerin and with complete resolution of pain.  His troponin was minimally elevated.  He was started on IV heparin.  Initially his EKG showed sinus rhythm with nonspecific T wave changes in the inferior leads which was somewhat similar to prior EKGs.  The patient denies orthopnea, PND, syncope, and lower extremity edema.  Cardiac cath was performed today which showed an estimated ejection fraction of 55-65%, mid RCA 95% stenosis, distal left main into ostial LAD lesion of 90%, ostial first diagonal lesion of 100% stenosis, proximal LAD lesion of 85% stenosis, and ostial circumflex lesion of 90% stenosis.  Due to the severity of critical left main and multivessel disease we have been consulted for coronary artery bypass grafting. On heparin gtt and Nitro gtt and is pain free.    Current Activity/ Functional Status: Patient was independent with mobility/ambulation, transfers, ADL's, IADL's.   Zubrod Score: At the time of surgery this patient's most appropriate  activity status/level should be described as: []     0    Normal activity, no symptoms [x]     1    Restricted in physical strenuous activity but ambulatory, able to do out light work []     2    Ambulatory and capable of self care, unable to do work activities, up and about                 more than 50%  Of the time                            []     3    Only limited self care, in bed greater than 50% of waking hours []     4    Completely disabled, no self care, confined to bed or chair []     5    Moribund  Past Medical History:  Diagnosis Date  . Blurred vision 05/17/2016  . Chest pain 04/16/2012  . Coronary artery disease   . Coronary artery disease due to lipid rich plaque 06/14/2008   Qualifier: Diagnosis of  By: Mare Ferrari, RMA, [name]    . Diplopia   . Essential hypertension 05/17/2016  . Hyperlipidemia with target low density lipoprotein (LDL) cholesterol less than 70 mg/dL 06/14/2008   Qualifier: Diagnosis of  By: Mare Ferrari, RMA, [name]    . Hypertension   . Hypertensive heart disease 06/14/2008   Qualifier: Diagnosis of  By: Mare Ferrari, [place],  [name]    . Hypertensive urgency, malignant 04/16/2012  . Internal hemorrhoids 04/19/2012  . Ischemic stroke ([place]) 05/02/2016  . pandiverticulosis 04/22/2012   12/17/2011. [facility]. Gerald Craver MD. Colonoscopy. Moderate sized internal hemorrhoids and extensive pandiverticulosis. Repeat 5 years.    . Polycythemia vera(238.4) 04/17/2012  . Syncope and collapse 04/17/2012   Pt syncopized while sitting in bed giving history @ time of admission to hospital Tachycardic (appeared sinus) to 120s-130s and hypotensive to 50s/30s.  Unresponsive initially >> Spontaneously resolved after 2-3 minutes >> return to baseline ~30 minutes   . Vertigo     Past Surgical History:  Procedure Laterality Date  . CORONARY STENT PLACEMENT    . LEFT HEART CATH AND CORONARY ANGIOGRAPHY N/A 04/16/2017   Procedure: LEFT HEART CATH AND CORONARY ANGIOGRAPHY;  Surgeon:  Sherren Mocha, MD;  Location: [facility];  Service: Cardiovascular;  Laterality: N/A;    Social History   Tobacco Use  Smoking Status Never Smoker  Smokeless Tobacco Never Used    Social History   Substance and Sexual Activity  Alcohol Use No  . Alcohol/week: 0.0 oz    Social History   Socioeconomic History  . Marital status: Married    Spouse name: Not on file  . Number of children: Not on file  . Years of education: Not on file  . Highest education level: Not on file  Social Needs  . Financial resource strain: Not on file  . Food insecurity - worry: Not on file  . Food insecurity - inability: Not on file  . Transportation needs - medical: Not on file  . Transportation needs - non-medical: Not on file  Occupational History  . Not on file  Tobacco Use  . Smoking status: Never Smoker  . Smokeless tobacco: Never Used  Substance and Sexual Activity  . Alcohol use: No    Alcohol/week: 0.0 oz  . Drug use: No  . Sexual activity: Not on file  Other Topics Concern  . Not on file  Social History Narrative   Lives in [place] with Wife and 2 sons.  From [place].  To [place] ~2000.     Currently retired but worked in Veterinary surgeon    Allergies  Allergen Reactions  . Lipitor [Atorvastatin] Shortness Of Breath  . Pork-Derived Products Other (See Comments)    No pork products - unspecified    Current Facility-Administered Medications  Medication Dose Route Frequency Provider Last Rate Last Dose  . 0.9 %  sodium chloride infusion   Intravenous Continuous Sherren Mocha, MD 75 mL/hr at 04/16/17 1001 300 mL at 04/16/17 1001  . acetaminophen (TYLENOL) tablet 650 mg  650 mg Oral Q6H PRN Jani Gravel, MD       Or  . acetaminophen (TYLENOL) suppository 650 mg  650 mg Rectal Q6H PRN Jani Gravel, MD      . aspirin EC tablet 81 mg  81 mg Oral Daily Jani Gravel, MD   81 mg at 04/15/17 1104  . heparin ADULT infusion 100 units/mL (25000 units/271mL sodium chloride  0.45%)  1,400 Units/hr Intravenous Continuous Lyndee Leo, RPH      . hydrochlorothiazide (HYDRODIURIL) tablet 25 mg  25 mg Oral Daily Jani Gravel, MD   25 mg at 04/15/17 1104  . irbesartan (AVAPRO) tablet 300 mg  300 mg Oral Daily Jani Gravel, MD   300 mg at 04/15/17 1357  . nitroGLYCERIN 50 mg in dextrose 5 % 250 mL (0.2 mg/mL) infusion  0-200 mcg/min Intravenous Titrated Jani Gravel, MD 3 mL/hr at 04/16/17 0916 10 mcg/min at 04/16/17 0916  . rosuvastatin (CRESTOR) tablet 40 mg  40 mg Oral q1800 Bhagat, Bhavinkumar, PA   40 mg at 04/15/17 1820    Medications Prior to Admission  Medication Sig Dispense Refill Last Dose  . aspirin EC 81 MG tablet Take 81 mg by mouth daily.   04/14/2017 at Unknown time  . hydrochlorothiazide (HYDRODIURIL) 25 MG tablet Take 1 tablet (25 mg total) by mouth daily. 90 tablet 2 04/14/2017 at Unknown time  . valsartan (DIOVAN) 320 MG tablet Take 1 tablet (320 mg total) by mouth daily. 90 tablet 0 04/14/2017 at Unknown time  . carvedilol (COREG) 12.5 MG tablet Take 1 tablet (12.5 mg total) 2 (two) times daily by mouth. (Patient not taking: Reported on 04/14/2017) 60 tablet 0 Not Taking at Unknown time  . clopidogrel (PLAVIX) 75 MG tablet Take 1 tablet (75 mg total) daily by mouth. (Patient not taking: Reported on 04/14/2017) 30 tablet 0 Not Taking at Unknown time    Family History  Problem Relation Age of Onset  . Heart disease Mother   . Cancer Sister      Review of Systems:   Review of Systems  Constitutional: Positive for diaphoresis. Negative for chills and fever.  Respiratory: Positive for shortness of breath.   Cardiovascular: Positive for chest pain.  Gastrointestinal: Negative for nausea and vomiting.  Psychiatric/Behavioral: Negative for depression.   Pertinent items are noted in HPI.        Physical Exam: BP 104/67 (BP Location: Right Arm)   Pulse 73   Temp 98.3 F (36.8 C) (Oral)   Resp (!) 7   Ht 5\' 6"  (1.676 m)   Wt 195 lb 8 oz (88.7 kg)    SpO2 97%   BMI 31.55 kg/m    General appearance: alert, cooperative and no distress Resp: clear to auscultation bilaterally Cardio: regular rate and rhythm, S1, S2 normal, no murmur, click, rub or gallop GI: soft, non-tender; bowel sounds normal; no masses,  no organomegaly Extremities: extremities normal, atraumatic, no cyanosis or edema Neurologic: Grossly normal Palpable pulses DP and PT bilaterally Veins appear adequate for coronary artery bypass graft  Diagnostic Studies & Laboratory data:     Recent Radiology Findings:   Dg Chest 2 View  Result Date: 04/14/2017 CLINICAL DATA:  Chest pain EXAM: CHEST  2 VIEW COMPARISON:  04/06/2016 FINDINGS: Heart is borderline in size. No confluent opacities, effusions or edema. No acute bony abnormality. IMPRESSION: No active cardiopulmonary disease. Electronically Signed   By: Rolm Baptise M.D.   On: 04/14/2017 22:26     I have independently reviewed the above radiologic studies.  Recent Lab Findings: Lab Results  Component Value Date   WBC 11.8 (H) 04/16/2017   HGB 13.4 04/16/2017   HCT 39.5 04/16/2017   PLT 178 04/16/2017   GLUCOSE 125 (H) 04/15/2017   CHOL 174 04/15/2017   TRIG 46 04/15/2017   HDL 39 (L) 04/15/2017   LDLDIRECT 150.8 06/30/2008   LDLCALC 126 (H) 04/15/2017   ALT 17 04/15/2017   AST 31 04/15/2017   NA 137 04/15/2017   K 3.5 04/15/2017   CL 103 04/15/2017   CREATININE 0.85 04/15/2017   BUN 13 04/15/2017   CO2 24 04/15/2017   TSH 4.888 (H) 04/17/2012   INR 1.04 04/16/2017   HGBA1C 5.9 (H) 04/15/2017    CATH: Procedures   LEFT HEART CATH AND CORONARY ANGIOGRAPHY  Conclusion     The left ventricular systolic function is normal.  LV end diastolic pressure is mildly elevated.  The left ventricular ejection fraction is 55-65% by visual estimate.  Prox RCA lesion is 50% stenosed.  Mid RCA lesion is 95% stenosed.  Dist RCA lesion is 50% stenosed with 90% stenosed side branch in Ost RPDA.  Dist  LM to Ost LAD lesion is 90% stenosed.  Ost 1st Diag lesion is 100% stenosed.  Prox LAD lesion is 85% stenosed.  Mid LAD lesion is 80% stenosed.  Ost 1st Mrg to 1st Mrg lesion is 80% stenosed.  Ost Cx lesion is 90% stenosed.  Prox Cx lesion is 50% stenosed.   1.  Critical left main and multivessel coronary artery disease with severe proximal LAD stenosis, severe ostial left circumflex stenosis and severe in-stent restenosis, and severe mid RCA stenosis 2.  Normal LV systolic function with mildly elevated LVEDP  Recommendations: The patient will be transferred to a cardiac stepdown bed.  IV heparin will be restarted and he will continue on IV nitroglycerin.  A cardiac surgical consultation will be placed for urgent coronary bypass surgery.  The patient is chest pain-free at the completion of the procedure but has critical multivessel disease.     I have independently reviewed the above  cath films and reviewed the findings with the  patient .  Transthoracic Echocardiography  Patient:    [name], [name] MR #:       [id number] Study Date: 05/03/2016 Gender:     M Age:        68 Height:     170.2 cm Weight:     83.5 kg BSA:        2.01 m^2 Pt. Status: Room:       5M15C   ADMITTING    [facility], [place], Inpatient  REFERRING    Holley Raring  SONOGRAPHER  Chelsea Androw  cc:  ------------------------------------------------------------------- LV EF: 60% -   65%  ------------------------------------------------------------------- Indications:      CVA 35.  ------------------------------------------------------------------- History:   PMH:   Coronary artery disease.  Risk factors: Hypertension. Dyslipidemia.  ------------------------------------------------------------------- Study Conclusions  - Left ventricle: The cavity size was normal. There was  moderate   concentric hypertrophy. Systolic function was normal. The   estimated ejection fraction was in the range of 60% to 65%. Wall   motion was normal; there were no regional wall motion   abnormalities. There was an increased relative contribution of   atrial contraction to ventricular filling. Doppler parameters are   consistent with abnormal left ventricular relaxation (grade 1   diastolic dysfunction). - Aortic valve: Trileaflet; mildly thickened leaflets. There was   mild regurgitation. - Aorta: Aortic root dimension: 40 mm (ED). - Aortic root: The aortic root was mildly dilated. - Mitral valve: Calcified annulus. There was trivial regurgitation. - Tricuspid valve: There was trivial regurgitation. - Pulmonary arteries: Systolic pressure could not be accurately   estimated.  ------------------------------------------------------------------- Study data:  Comparison was made to the study of 04/17/2012.  Study status:  Routine.  Procedure:  Technically difficult parasternal views. The patient reported no pain pre or post test. Transthoracic echocardiography. Image quality was adequate.  Study completion: There were no complications.          Transthoracic echocardiography.  M-mode, complete  2D, spectral Doppler, and color Doppler.  Birthdate:  Patient birthdate: 10-Jun-1940.  Age:  Patient is 77 yr old.  Sex:  Gender: male.    BMI: 28.8 kg/m^2.  Blood pressure:     161/67  Patient status:  Inpatient.  Study date: Study date: 05/03/2016. Study time: 02:39 PM.  Location:  Bedside.   -------------------------------------------------------------------  ------------------------------------------------------------------- Left ventricle:  The cavity size was normal. There was moderate concentric hypertrophy. Systolic function was normal. The estimated ejection fraction was in the range of 60% to 65%. Wall motion was normal; there were no regional wall motion abnormalities. There  was an increased relative contribution of atrial contraction to ventricular filling. Doppler parameters are consistent with abnormal left ventricular relaxation (grade 1 diastolic dysfunction).  ------------------------------------------------------------------- Aortic valve:   Trileaflet; mildly thickened leaflets. Mobility was not restricted.  Doppler:  Transvalvular velocity was within the normal range. There was no stenosis. There was mild regurgitation.   ------------------------------------------------------------------- Aorta:  Aortic root: The aortic root was mildly dilated.  ------------------------------------------------------------------- Mitral valve:   Calcified annulus. Mobility was not restricted. Doppler:  Transvalvular velocity was within the normal range. There was no evidence for stenosis. There was trivial regurgitation.  ------------------------------------------------------------------- Left atrium:  The atrium was normal in size.  ------------------------------------------------------------------- Right ventricle:  The cavity size was normal. Wall thickness was normal. Systolic function was normal.  ------------------------------------------------------------------- Pulmonic valve:    Structurally normal valve.   Cusp separation was normal.  Doppler:  Transvalvular velocity was within the normal range. There was no evidence for stenosis. There was no regurgitation.  ------------------------------------------------------------------- Tricuspid valve:   Structurally normal valve.    Doppler: Transvalvular velocity was within the normal range. There was trivial regurgitation.  ------------------------------------------------------------------- Pulmonary artery:   The main pulmonary artery was normal-sized. Systolic pressure could not be accurately estimated.  ------------------------------------------------------------------- Right atrium:  The  atrium was normal in size.  ------------------------------------------------------------------- Pericardium:  There was no pericardial effusion.  ------------------------------------------------------------------- Systemic veins: Inferior vena cava: The vessel was normal in size.  ------------------------------------------------------------------- Measurements   Left ventricle                           Value        Reference  LV ID, ED, PLAX chordal                  43.7  mm     43 - 52  LV ID, ES, PLAX chordal                  31.7  mm     23 - 38  LV fx shortening, PLAX chordal   (L)     27    %      >=29  LV PW thickness, ED                      12.8  mm     ----------  IVS/LV PW ratio, ED                      1.21         <=1.3  Stroke volume, 2D                        45    ml     ----------  Stroke volume/bsa, 2D  22    ml/m^2 ----------  LV ejection fraction, 1-p A4C            51    %      ----------  LV end-diastolic volume, 2-p             108   ml     ----------  LV end-systolic volume, 2-p              55    ml     ----------  LV ejection fraction, 2-p                50    %      ----------  Stroke volume, 2-p                       54    ml     ----------  LV end-diastolic volume/bsa, 2-p         54    ml/m^2 ----------  LV end-systolic volume/bsa, 2-p          27    ml/m^2 ----------  Stroke volume/bsa, 2-p                   26.6  ml/m^2 ----------  LV e&', lateral                           7.14  cm/s   ----------  LV E/e&', lateral                         7.37         ----------  LV e&', medial                            4.14  cm/s   ----------  LV E/e&', medial                          12.71        ----------  LV e&', average                           5.64  cm/s   ----------  LV E/e&', average                         9.33         ----------    Ventricular septum                       Value        Reference  IVS thickness, ED                         15.5  mm     ----------    LVOT                                     Value        Reference  LVOT ID, S                               17  mm     ----------  LVOT area                                2.27  cm^2   ----------  LVOT peak velocity, S                    87.7  cm/s   ----------  LVOT mean velocity, S                    55.7  cm/s   ----------  LVOT VTI, S                              19.9  cm     ----------    Aorta                                    Value        Reference  Aortic root ID, ED                       40    mm     ----------    Left atrium                              Value        Reference  LA ID, A-P, ES                           50    mm     ----------  LA ID/bsa, A-P                   (H)     2.49  cm/m^2 <=2.2  LA volume, S                             56.4  ml     ----------  LA volume/bsa, S                         28.1  ml/m^2 ----------  LA volume, ES, 1-p A4C                   59.5  ml     ----------  LA volume/bsa, ES, 1-p A4C               29.6  ml/m^2 ----------  LA volume, ES, 1-p A2C                   48.2  ml     ----------  LA volume/bsa, ES, 1-p A2C               24    ml/m^2 ----------    Mitral valve                             Value        Reference  Mitral E-wave peak velocity              52.6  cm/s   ----------  Mitral A-wave peak velocity              100   cm/s   ----------  Mitral deceleration time         (H)     335   ms     150 - 230  Mitral E/A ratio, peak                   0.5          ----------    Right atrium                             Value        Reference  RA ID, S-I, ES, A4C              (H)     52.3  mm     34 - 49  RA area, ES, A4C                         18.4  cm^2   8.3 - 19.5  RA volume, ES, A/L                       50.6  ml     ----------  RA volume/bsa, ES, A/L                   25.2  ml/m^2 ----------    Systemic veins                           Value        Reference  Estimated CVP                             3     mm Hg  ----------    Right ventricle                          Value        Reference  TAPSE                                    21.6  mm     ----------  RV s&', lateral, S                        15.3  cm/s   ----------  Legend: (L)  and  (H)  mark values outside specified reference range.  ------------------------------------------------------------------- Prepared and Electronically Authenticated by  Fransico Him, MD 2018-03-15T15:45:43  Assessment / Plan:   Plan for urgent CABG tomorrow .  I discussed the patient's coronary anatomy with he and his family wife and son in detail and have recommended proceeding with coronary artery bypass grafting first thing in the morning.  Risks and options have been discussed in detail. The goals risks and alternatives of the planned surgical procedure Procedure(s): LEFT HEART CATH AND CORONARY ANGIOGRAPHY (N/A)  have been discussed with the patient in detail. The risks of the procedure including death, infection, stroke, myocardial infarction, bleeding, blood transfusion have all been discussed specifically.  I have quoted [name] a 3 % of perioperative mortality and a complication rate as  high as 35 %. The patient's questions have been answered.[name] is willing  to proceed with the planned procedure.  [name] MD      [address] [place] [address] Office [phone]   [place]

## 2017-04-16 NOTE — Progress Notes (Signed)
[place] for heparin Indication: chest pain/ACS  Allergies  Allergen Reactions  . Lipitor [Atorvastatin] Shortness Of Breath  . Pork-Derived Products Other (See Comments)    No pork products - unspecified    Patient Measurements: Height: 5\' 6"  (167.6 cm) Weight: 195 lb 8 oz (88.7 kg) IBW/kg (Calculated) : 63.8 Heparin Dosing Weight: 80kg  Vital Signs: Temp: 98 F (36.7 C) (02/26 0653) Temp Source: Oral (02/26 0653) BP: 150/80 (02/26 1210) Pulse Rate: 65 (02/26 1210)  Labs: Recent Labs    04/14/17 2150 04/15/17 0036 04/15/17 0604 04/15/17 1100 04/15/17 1320 04/15/17 2047 04/16/17 0309  HGB 14.3  --  13.6  --   --   --  13.4  HCT 40.7  --  40.0  --   --   --  39.5  PLT 205  --  196  --   --   --  178  LABPROT  --   --   --   --   --   --  13.5  INR  --   --   --   --   --   --  1.04  HEPARINUNFRC  --   --   --  0.16*  --  0.39 0.45  CREATININE 0.84  --  0.85  --   --   --   --   TROPONINI  --  0.14* 0.13*  --  0.10*  --   --     Estimated Creatinine Clearance: 79.6 mL/min (by C-G formula based on SCr of 0.85 mg/dL).   Medical History: Past Medical History:  Diagnosis Date  . Blurred vision 05/17/2016  . Chest pain 04/16/2012  . Coronary artery disease   . Coronary artery disease due to lipid rich plaque 06/14/2008   Qualifier: Diagnosis of  By: Mare Ferrari, RMA, [name]    . Diplopia   . Essential hypertension 05/17/2016  . Hyperlipidemia with target low density lipoprotein (LDL) cholesterol less than 70 mg/dL 06/14/2008   Qualifier: Diagnosis of  By: Mare Ferrari, RMA, [name]    . Hypertension   . Hypertensive heart disease 06/14/2008   Qualifier: Diagnosis of  By: Mare Ferrari, [place], [name]    . Hypertensive urgency, malignant 04/16/2012  . Internal hemorrhoids 04/19/2012  . Ischemic stroke ([place]) 05/02/2016  . pandiverticulosis 04/22/2012   12/17/2011. [place]. Juanita Craver MD. Colonoscopy. Moderate sized internal  hemorrhoids and extensive pandiverticulosis. Repeat 5 years.    . Polycythemia vera(238.4) 04/17/2012  . Syncope and collapse 04/17/2012   Pt syncopized while sitting in bed giving history @ time of admission to hospital Tachycardic (appeared sinus) to 120s-130s and hypotensive to 50s/30s.  Unresponsive initially >> Spontaneously resolved after 2-3 minutes >> return to baseline ~30 minutes   . Vertigo     Assessment: 77yo male c/o crushing CP radiating to LUE and associated w/ SOB and diaphoresis, istat troponin negative, started on Heparin for anticoagulation.   Heparin level this morning is therapeutic.  No bleeding noted. Cath this morning showed MV CAD, CVTS has been consulted for CABG.   Goal of Therapy:  Heparin level 0.3-0.7 units/ml Monitor platelets by anticoagulation protocol: Yes   Plan:  1. Restart Heparin at 1400 units/hr>>1415pm 2. Will continue to monitor for any signs/symptoms of bleeding  Thank you for allowing pharmacy to be a part of this patient's care.  Erin Hearing PharmD., BCPS Clinical Pharmacist 04/16/2017 12:40 PM

## 2017-04-16 NOTE — Interval H&P Note (Signed)
Cath Lab Visit (complete for each Cath Lab visit)  Clinical Evaluation Leading to the Procedure:   ACS: Yes.    Non-ACS:    Anginal Classification: CCS IV  Anti-ischemic medical therapy: No Therapy  Non-Invasive Test Results: No non-invasive testing performed  Prior CABG: No previous CABG      History and Physical Interval Note:  04/16/2017 9:05 AM  [name]  has presented today for surgery, with the diagnosis of ua  The various methods of treatment have been discussed with the patient and family. After consideration of risks, benefits and other options for treatment, the patient has consented to  Procedure(s): LEFT HEART CATH AND CORONARY ANGIOGRAPHY (N/A) as a surgical intervention .  The patient's history has been reviewed, patient examined, no change in status, stable for surgery.  I have reviewed the patient's chart and labs.  Questions were answered to the patient's satisfaction.     Sherren Mocha

## 2017-04-16 NOTE — Progress Notes (Signed)
PROGRESS NOTE  [name]:[id number] DOB: [birth year] DOA: 04/14/2017 PCP: Jaynee Eagles, PA-C  HPI/Recap of past 24 hours: AnibalRodriguezis a30 y.o.male,w CAD s/p PCI with 2 stents, Hypertension, Hyperlipidemia, CVA 2018 with no residual deficits, had some chest discomfort as well as dyspnea and diaphoresis. Admitted for chest pain r/o ACS. troponinelevated at 0.13 and trending down to 0.10. Cardiology consulted.  04/15/17: Patient seen and examined at his bedside in the ED [name]. Chest pain is improving. cardiology consulted. Heart cath planned 04/16/17 which has been postponed.  04/16/17: patient seen and examined at his bedside. Chest pain improved on nitro drip. No other complaints. Possible LHC tomorrow.  Assessment/Plan: Principal Problem:   Chest pain Active Problems:   Hypokalemia   Non-ST elevation (NSTEMI) myocardial infarction [facility])  Chest pain r/o ACS -troponin peaked at 0.14 and trended down -continue nitro drip -LHC planned tomorrow 04/17/17 -cardiology following -EKG no ST T elevations -continue telemetry monitoring -cardiology following  CAD s/p PCI with 2 stents -continue asa, statin, irbesartan  Chronic HFPEF grade 1 diastolic dysfunction LVEF 60-65% (05/03/16) -no acute issues -continue home meds  HTN -hctz -irbesartan  HLD -crestor -ldl 126 -goal ldl<70   Code Status: full   Family Communication: wife and son   Disposition Plan: to be determined    Consultants:  cardiology  Procedures:  [place] 04/16/17  Antimicrobials:  none  DVT prophylaxis:  SCDs   Objective: Vitals:   04/16/17 1200 04/16/17 1210 04/16/17 1300 04/16/17 1601  BP:  (!) 150/80 104/67 (!) 161/87  Pulse: 63 65 73 79  Resp: 10 (!) 7  (!) 26  Temp:   98.3 F (36.8 C) 98.2 F (36.8 C)  TempSrc:   Oral Oral  SpO2: 94% 92% 97% 94%  Weight:      Height:        Intake/Output Summary (Last 24 hours) at 04/16/2017 1625 Last data filed at 04/16/2017  1300 Gross per 24 hour  Intake 1321.93 ml  Output 900 ml  Net 421.93 ml   Filed Weights   04/15/17 0100 04/15/17 1542 04/16/17 0653  Weight: 86.8 kg (191 lb 5.8 oz) 88.7 kg (195 lb 9.6 oz) 88.7 kg (195 lb 8 oz)    Exam:   General:  77 yo male WD WN NAD A&O x3  Cardiovascular: RRR no rubs or gallops  Respiratory: CTA no wheezes or rales  Abdomen: soft NT ND NBS x4  Musculoskeletal: No focal abnormalities  Skin: no rash  Psychiatry: mood appropriate for condition and setting    Data Reviewed: CBC: Recent Labs  Lab 04/14/17 2150 04/15/17 0604 04/16/17 0309  WBC 7.0 8.2 11.8*  HGB 14.3 13.6 13.4  HCT 40.7 40.0 39.5  MCV 90.4 90.7 90.4  PLT 205 196 937   Basic Metabolic Panel: Recent Labs  Lab 04/14/17 2150 04/14/17 2308 04/15/17 0604  NA 138  --  137  K 3.0*  --  3.5  CL 107  --  103  CO2 21*  --  24  GLUCOSE 150*  --  125*  BUN 16  --  13  CREATININE 0.84  --  0.85  CALCIUM 7.8*  --  8.6*  MG  --  2.0  --    GFR: Estimated Creatinine Clearance: 79.6 mL/min (by C-G formula based on SCr of 0.85 mg/dL). Liver Function Tests: Recent Labs  Lab 04/15/17 0604  AST 31  ALT 17  ALKPHOS 55  BILITOT 1.0  PROT 6.1*  ALBUMIN 3.2*  No results for input(s): LIPASE, AMYLASE in the last 168 hours. No results for input(s): AMMONIA in the last 168 hours. Coagulation Profile: Recent Labs  Lab 04/16/17 0309  INR 1.04   Cardiac Enzymes: Recent Labs  Lab 04/15/17 0036 04/15/17 0604 04/15/17 1320  TROPONINI 0.14* 0.13* 0.10*   BNP (last 3 results) No results for input(s): PROBNP in the last 8760 hours. HbA1C: Recent Labs    04/15/17 0241  HGBA1C 5.9*   CBG: Recent Labs  Lab 04/16/17 0755  GLUCAP 135*   Lipid Profile: Recent Labs    04/15/17 0241  CHOL 174  HDL 39*  LDLCALC 126*  TRIG 46  CHOLHDL 4.5   Thyroid Function Tests: No results for input(s): TSH, T4TOTAL, FREET4, T3FREE, THYROIDAB in the last 72 hours. Anemia Panel: No  results for input(s): VITAMINB12, FOLATE, FERRITIN, TIBC, IRON, RETICCTPCT in the last 72 hours. Urine analysis:    Component Value Date/Time   COLORURINE YELLOW 01/21/2007 2134   APPEARANCEUR CLEAR 01/21/2007 2134   LABSPEC 1.020 01/21/2007 2134   PHURINE 5.5 01/21/2007 2134   GLUCOSEU NEGATIVE 01/21/2007 2134   HGBUR NEGATIVE 01/21/2007 2134   BILIRUBINUR small 05/31/2014 [place] 01/21/2007 2134   PROTEINUR trace 05/31/2014 [place] 01/21/2007 2134   UROBILINOGEN 0.2 05/31/2014 1146   UROBILINOGEN 0.2 01/21/2007 2134   NITRITE neg 05/31/2014 1146   NITRITE NEGATIVE 01/21/2007 2134   LEUKOCYTESUR Negative 05/31/2014 1146   Sepsis Labs: @LABRCNTIP (procalcitonin:4,lacticidven:4)  ) Recent Results (from the past 240 hour(s))  MRSA PCR Screening     Status: None   Collection Time: 04/15/17  6:29 PM  Result Value Ref Range Status   MRSA by PCR NEGATIVE NEGATIVE Final    Comment:        The GeneXpert MRSA Assay (FDA approved for NASAL specimens only), is one component of a comprehensive MRSA colonization surveillance program. It is not intended to diagnose MRSA infection nor to guide or monitor treatment for MRSA infections. Performed at [facility], [place] [address], [place], [place] [address]       Studies: No results found.  Scheduled Meds: . aspirin EC  81 mg Oral Daily  . hydrochlorothiazide  25 mg Oral Daily  . irbesartan  300 mg Oral Daily  . rosuvastatin  40 mg Oral q1800  . sodium chloride flush  3 mL Intravenous Q12H    Continuous Infusions: . sodium chloride    . heparin 1,400 Units/hr (04/16/17 1413)  . nitroGLYCERIN 10 mcg/min (04/16/17 0916)     LOS: 1 day     Kayleen Memos, MD [facility] Pager [phone]  If 7PM-7AM, please contact night-coverage [email or web address] Password [facility] 04/16/2017, 4:25 PM

## 2017-04-17 ENCOUNTER — Inpatient Hospital Stay (HOSPITAL_COMMUNITY): Payer: Medicare HMO

## 2017-04-17 ENCOUNTER — Inpatient Hospital Stay (HOSPITAL_COMMUNITY)
Admission: EM | Disposition: A | Discharge status: Home Health | Payer: Self-pay | Source: Home / Self Care | Attending: Cardiothoracic Surgery

## 2017-04-17 ENCOUNTER — Inpatient Hospital Stay (HOSPITAL_COMMUNITY): Payer: Medicare HMO | Admitting: Anesthesiology

## 2017-04-17 DIAGNOSIS — Z951 Presence of aortocoronary bypass graft: Secondary | ICD-10-CM

## 2017-04-17 DIAGNOSIS — I25110 Atherosclerotic heart disease of native coronary artery with unstable angina pectoris: Secondary | ICD-10-CM

## 2017-04-17 HISTORY — DX: Presence of aortocoronary bypass graft: Z95.1

## 2017-04-17 HISTORY — PX: CORONARY ARTERY BYPASS GRAFT: SHX141

## 2017-04-17 HISTORY — PX: TEE WITHOUT CARDIOVERSION: SHX5443

## 2017-04-17 LAB — POCT I-STAT 3, ART BLOOD GAS (G3+)
Acid-Base Excess: 2 mmol/L (ref 0.0–2.0)
Acid-Base Excess: 1 mmol/L (ref 0.0–2.0)
Acid-base deficit: 2 mmol/L (ref 0.0–2.0)
Acid-base deficit: 2 mmol/L (ref 0.0–2.0)
BICARBONATE: 26.2 mmol/L (ref 20.0–28.0)
Bicarbonate: 25.9 mmol/L (ref 20.0–28.0)
Bicarbonate: 23.3 mmol/L (ref 20.0–28.0)
Bicarbonate: 24.2 mmol/L (ref 20.0–28.0)
Bicarbonate: 24 mmol/L (ref 20.0–28.0)
O2 SAT: 88 %
O2 Saturation: 100 %
O2 Saturation: 97 %
O2 Saturation: 99 %
O2 Saturation: 92 %
PH ART: 7.326 — AB (ref 7.350–7.450)
PH ART: 7.318 — AB (ref 7.350–7.450)
PO2 ART: 156 mmHg — AB (ref 83.0–108.0)
Patient temperature: 35.3
Patient temperature: 37.1
Patient temperature: 37.3
TCO2: 27 mmol/L (ref 22–32)
TCO2: 24 mmol/L (ref 22–32)
TCO2: 28 mmol/L (ref 22–32)
TCO2: 26 mmol/L (ref 22–32)
TCO2: 25 mmol/L (ref 22–32)
pCO2 arterial: 38.7 mmHg (ref 32.0–48.0)
pCO2 arterial: 33.4 mmHg (ref 32.0–48.0)
pCO2 arterial: 41.3 mmHg (ref 32.0–48.0)
pCO2 arterial: 46.4 mmHg (ref 32.0–48.0)
pCO2 arterial: 47 mmHg (ref 32.0–48.0)
pH, Arterial: 7.434 (ref 7.350–7.450)
pH, Arterial: 7.452 — ABNORMAL HIGH (ref 7.350–7.450)
pH, Arterial: 7.403 (ref 7.350–7.450)
pO2, Arterial: 379 mmHg — ABNORMAL HIGH (ref 83.0–108.0)
pO2, Arterial: 88 mmHg (ref 83.0–108.0)
pO2, Arterial: 71 mmHg — ABNORMAL LOW (ref 83.0–108.0)
pO2, Arterial: 60 mmHg — ABNORMAL LOW (ref 83.0–108.0)

## 2017-04-17 LAB — PROTIME-INR
INR: 1.55
PROTHROMBIN TIME: 18.5 s — AB (ref 11.4–15.2)

## 2017-04-17 LAB — CBC
HCT: 33.2 % — ABNORMAL LOW (ref 39.0–52.0)
HEMATOCRIT: 39.6 % (ref 39.0–52.0)
HEMATOCRIT: 28.9 % — AB (ref 39.0–52.0)
HEMOGLOBIN: 13.6 g/dL (ref 13.0–17.0)
HEMOGLOBIN: 10.3 g/dL — AB (ref 13.0–17.0)
Hemoglobin: 11.4 g/dL — ABNORMAL LOW (ref 13.0–17.0)
MCH: 31.3 pg (ref 26.0–34.0)
MCH: 32.3 pg (ref 26.0–34.0)
MCH: 30.8 pg (ref 26.0–34.0)
MCHC: 34.3 g/dL (ref 30.0–36.0)
MCHC: 35.6 g/dL (ref 30.0–36.0)
MCHC: 34.3 g/dL (ref 30.0–36.0)
MCV: 91 fL (ref 78.0–100.0)
MCV: 90.6 fL (ref 78.0–100.0)
MCV: 89.7 fL (ref 78.0–100.0)
Platelets: 183 10*3/uL (ref 150–400)
Platelets: 91 10*3/uL — ABNORMAL LOW (ref 150–400)
Platelets: 136 10*3/uL — ABNORMAL LOW (ref 150–400)
RBC: 4.35 MIL/uL (ref 4.22–5.81)
RBC: 3.19 MIL/uL — ABNORMAL LOW (ref 4.22–5.81)
RBC: 3.7 MIL/uL — ABNORMAL LOW (ref 4.22–5.81)
RDW: 13.9 % (ref 11.5–15.5)
RDW: 13.6 % (ref 11.5–15.5)
RDW: 13.5 % (ref 11.5–15.5)
WBC: 10.5 10*3/uL (ref 4.0–10.5)
WBC: 10.5 10*3/uL (ref 4.0–10.5)
WBC: 15 10*3/uL — ABNORMAL HIGH (ref 4.0–10.5)

## 2017-04-17 LAB — GLUCOSE, CAPILLARY
GLUCOSE-CAPILLARY: 126 mg/dL — AB (ref 65–99)
GLUCOSE-CAPILLARY: 144 mg/dL — AB (ref 65–99)
GLUCOSE-CAPILLARY: 136 mg/dL — AB (ref 65–99)
GLUCOSE-CAPILLARY: 144 mg/dL — AB (ref 65–99)
GLUCOSE-CAPILLARY: 121 mg/dL — AB (ref 65–99)
Glucose-Capillary: 125 mg/dL — ABNORMAL HIGH (ref 65–99)
Glucose-Capillary: 148 mg/dL — ABNORMAL HIGH (ref 65–99)
Glucose-Capillary: 151 mg/dL — ABNORMAL HIGH (ref 65–99)
Glucose-Capillary: 171 mg/dL — ABNORMAL HIGH (ref 65–99)
Glucose-Capillary: 146 mg/dL — ABNORMAL HIGH (ref 65–99)

## 2017-04-17 LAB — VAS US DOPPLER PRE CABG
LEFT ECA DIAS: -11 cm/s
LEFT VERTEBRAL DIAS: 9 cm/s
Left CCA dist dias: 9 cm/s
Left CCA dist sys: 124 cm/s
Left CCA prox dias: 19 cm/s
Left CCA prox sys: 110 cm/s
Left ICA dist dias: -29 cm/s
Left ICA dist sys: -70 cm/s
Left ICA prox dias: -19 cm/s
Left ICA prox sys: -86 cm/s
RIGHT ECA DIAS: -5 cm/s
RIGHT VERTEBRAL DIAS: 7 cm/s
Right CCA prox dias: 21 cm/s
Right CCA prox sys: 110 cm/s
Right cca dist sys: -50 cm/s

## 2017-04-17 LAB — POCT I-STAT, CHEM 8
BUN: 8 mg/dL (ref 6–20)
BUN: 7 mg/dL (ref 6–20)
BUN: 8 mg/dL (ref 6–20)
BUN: 8 mg/dL (ref 6–20)
BUN: 8 mg/dL (ref 6–20)
BUN: 7 mg/dL (ref 6–20)
BUN: 9 mg/dL (ref 6–20)
CALCIUM ION: 1.22 mmol/L (ref 1.15–1.40)
CALCIUM ION: 1.09 mmol/L — AB (ref 1.15–1.40)
CHLORIDE: 102 mmol/L (ref 101–111)
CREATININE: 0.6 mg/dL — AB (ref 0.61–1.24)
CREATININE: 0.7 mg/dL (ref 0.61–1.24)
CREATININE: 0.7 mg/dL (ref 0.61–1.24)
CREATININE: 0.6 mg/dL — AB (ref 0.61–1.24)
CREATININE: 0.7 mg/dL (ref 0.61–1.24)
Calcium, Ion: 1.16 mmol/L (ref 1.15–1.40)
Calcium, Ion: 0.98 mmol/L — ABNORMAL LOW (ref 1.15–1.40)
Calcium, Ion: 1.1 mmol/L — ABNORMAL LOW (ref 1.15–1.40)
Calcium, Ion: 1.08 mmol/L — ABNORMAL LOW (ref 1.15–1.40)
Calcium, Ion: 1.2 mmol/L (ref 1.15–1.40)
Chloride: 102 mmol/L (ref 101–111)
Chloride: 99 mmol/L — ABNORMAL LOW (ref 101–111)
Chloride: 102 mmol/L (ref 101–111)
Chloride: 102 mmol/L (ref 101–111)
Chloride: 103 mmol/L (ref 101–111)
Chloride: 106 mmol/L (ref 101–111)
Creatinine, Ser: 0.7 mg/dL (ref 0.61–1.24)
Creatinine, Ser: 0.7 mg/dL (ref 0.61–1.24)
GLUCOSE: 104 mg/dL — AB (ref 65–99)
GLUCOSE: 117 mg/dL — AB (ref 65–99)
GLUCOSE: 131 mg/dL — AB (ref 65–99)
GLUCOSE: 159 mg/dL — AB (ref 65–99)
Glucose, Bld: 119 mg/dL — ABNORMAL HIGH (ref 65–99)
Glucose, Bld: 122 mg/dL — ABNORMAL HIGH (ref 65–99)
Glucose, Bld: 131 mg/dL — ABNORMAL HIGH (ref 65–99)
HCT: 36 % — ABNORMAL LOW (ref 39.0–52.0)
HCT: 32 % — ABNORMAL LOW (ref 39.0–52.0)
HCT: 29 % — ABNORMAL LOW (ref 39.0–52.0)
HCT: 28 % — ABNORMAL LOW (ref 39.0–52.0)
HCT: 29 % — ABNORMAL LOW (ref 39.0–52.0)
HCT: 32 % — ABNORMAL LOW (ref 39.0–52.0)
HEMATOCRIT: 26 % — AB (ref 39.0–52.0)
HEMOGLOBIN: 10.9 g/dL — AB (ref 13.0–17.0)
Hemoglobin: 12.2 g/dL — ABNORMAL LOW (ref 13.0–17.0)
Hemoglobin: 10.9 g/dL — ABNORMAL LOW (ref 13.0–17.0)
Hemoglobin: 8.8 g/dL — ABNORMAL LOW (ref 13.0–17.0)
Hemoglobin: 9.9 g/dL — ABNORMAL LOW (ref 13.0–17.0)
Hemoglobin: 9.5 g/dL — ABNORMAL LOW (ref 13.0–17.0)
Hemoglobin: 9.9 g/dL — ABNORMAL LOW (ref 13.0–17.0)
Potassium: 3.8 mmol/L (ref 3.5–5.1)
Potassium: 3.6 mmol/L (ref 3.5–5.1)
Potassium: 3.6 mmol/L (ref 3.5–5.1)
Potassium: 3.7 mmol/L (ref 3.5–5.1)
Potassium: 3.4 mmol/L — ABNORMAL LOW (ref 3.5–5.1)
Potassium: 3 mmol/L — ABNORMAL LOW (ref 3.5–5.1)
Potassium: 3.8 mmol/L (ref 3.5–5.1)
SODIUM: 138 mmol/L (ref 135–145)
SODIUM: 140 mmol/L (ref 135–145)
Sodium: 139 mmol/L (ref 135–145)
Sodium: 142 mmol/L (ref 135–145)
Sodium: 140 mmol/L (ref 135–145)
Sodium: 142 mmol/L (ref 135–145)
Sodium: 141 mmol/L (ref 135–145)
TCO2: 26 mmol/L (ref 22–32)
TCO2: 28 mmol/L (ref 22–32)
TCO2: 30 mmol/L (ref 22–32)
TCO2: 27 mmol/L (ref 22–32)
TCO2: 29 mmol/L (ref 22–32)
TCO2: 24 mmol/L (ref 22–32)
TCO2: 23 mmol/L (ref 22–32)

## 2017-04-17 LAB — HEPARIN LEVEL (UNFRACTIONATED): Heparin Unfractionated: 0.44 IU/mL (ref 0.30–0.70)

## 2017-04-17 LAB — BASIC METABOLIC PANEL
Anion gap: 9 (ref 5–15)
BUN: 8 mg/dL (ref 6–20)
CHLORIDE: 104 mmol/L (ref 101–111)
CO2: 23 mmol/L (ref 22–32)
CREATININE: 0.89 mg/dL (ref 0.61–1.24)
Calcium: 8.9 mg/dL (ref 8.9–10.3)
GFR calc Af Amer: >60 mL/min (ref >60)
GFR calc non Af Amer: >60 mL/min (ref >60)
Glucose, Bld: 136 mg/dL — ABNORMAL HIGH (ref 65–99)
Potassium: 3.4 mmol/L — ABNORMAL LOW (ref 3.5–5.1)
SODIUM: 136 mmol/L (ref 135–145)

## 2017-04-17 LAB — APTT: aPTT: 28 seconds (ref 24–36)

## 2017-04-17 LAB — SURGICAL PCR SCREEN
MRSA, PCR: NEGATIVE
Staphylococcus aureus: NEGATIVE

## 2017-04-17 LAB — CREATININE, SERUM
Creatinine, Ser: 0.84 mg/dL (ref 0.61–1.24)
GFR calc Af Amer: >60 mL/min (ref >60)
GFR calc non Af Amer: >60 mL/min (ref >60)

## 2017-04-17 LAB — PLATELET COUNT: Platelets: 128 10*3/uL — ABNORMAL LOW (ref 150–400)

## 2017-04-17 LAB — MAGNESIUM: Magnesium: 3.3 mg/dL — ABNORMAL HIGH (ref 1.7–2.4)

## 2017-04-17 LAB — HEMOGLOBIN AND HEMATOCRIT, BLOOD
HCT: 27.8 % — ABNORMAL LOW (ref 39.0–52.0)
Hemoglobin: 9.8 g/dL — ABNORMAL LOW (ref 13.0–17.0)

## 2017-04-17 LAB — PREPARE RBC (CROSSMATCH)

## 2017-04-17 SURGERY — CORONARY ARTERY BYPASS GRAFTING (CABG)
Anesthesia: General | Site: Chest

## 2017-04-17 MED ORDER — NITROGLYCERIN IN D5W 200-5 MCG/ML-% IV SOLN
0.0000 ug/min | INTRAVENOUS | Status: DC
  Administered 2017-04-17: 30 ug/min via INTRAVENOUS

## 2017-04-17 MED ORDER — PROPOFOL 10 MG/ML IV BOLUS
INTRAVENOUS | Status: AC
  Filled 2017-04-17: qty 20

## 2017-04-17 MED ORDER — MIDAZOLAM HCL 10 MG/2ML IJ SOLN
INTRAMUSCULAR | Status: AC
  Filled 2017-04-17: qty 2

## 2017-04-17 MED ORDER — SODIUM CHLORIDE 0.9 % IV SOLN
0.0000 ug/min | INTRAVENOUS | Status: DC
  Administered 2017-04-17: 30 ug/min via INTRAVENOUS

## 2017-04-17 MED ORDER — VANCOMYCIN HCL IN DEXTROSE 1-5 GM/200ML-% IV SOLN
1000.0000 mg | Freq: Once | INTRAVENOUS | Status: AC
  Administered 2017-04-17: 1000 mg via INTRAVENOUS
  Filled 2017-04-17: qty 200

## 2017-04-17 MED ORDER — ONDANSETRON HCL 4 MG/2ML IJ SOLN: 4.0000 mg | Freq: Four times a day (QID) | INTRAMUSCULAR | Status: DC | PRN

## 2017-04-17 MED ORDER — INSULIN REGULAR BOLUS VIA INFUSION
0.0000 [IU] | Freq: Three times a day (TID) | INTRAVENOUS | Status: DC
  Filled 2017-04-17: qty 10

## 2017-04-17 MED ORDER — MORPHINE SULFATE (PF) 4 MG/ML IV SOLN
2.0000 mg | INTRAVENOUS | Status: DC | PRN
  Administered 2017-04-17: 2 mg via INTRAVENOUS
  Administered 2017-04-17 – 2017-04-18 (×3): 4 mg via INTRAVENOUS
  Filled 2017-04-17 (×4): qty 1

## 2017-04-17 MED ORDER — CHLORHEXIDINE GLUCONATE 0.12% ORAL RINSE (MEDLINE KIT)
15.0000 mL | Freq: Two times a day (BID) | OROMUCOSAL | Status: DC
  Administered 2017-04-17: 15 mL via OROMUCOSAL

## 2017-04-17 MED ORDER — TRANEXAMIC ACID 1000 MG/10ML IV SOLN
1.5000 mg/kg/h | INTRAVENOUS | Status: DC
  Filled 2017-04-17: qty 10

## 2017-04-17 MED ORDER — ONDANSETRON HCL 4 MG/2ML IJ SOLN
INTRAMUSCULAR | Status: DC | PRN
  Administered 2017-04-17: 4 mg via INTRAVENOUS

## 2017-04-17 MED ORDER — HEPARIN SODIUM (PORCINE) 1000 UNIT/ML IJ SOLN
INTRAMUSCULAR | Status: DC | PRN
  Administered 2017-04-17: 31000 [IU] via INTRAVENOUS

## 2017-04-17 MED ORDER — MILRINONE LACTATE IN DEXTROSE 20-5 MG/100ML-% IV SOLN
0.2500 ug/kg/min | INTRAVENOUS | Status: DC
  Administered 2017-04-17: 0.3 ug/kg/min via INTRAVENOUS
  Administered 2017-04-17 – 2017-04-18 (×2): 0.25 ug/kg/min via INTRAVENOUS
  Filled 2017-04-17 (×2): qty 100

## 2017-04-17 MED ORDER — PROTAMINE SULFATE 10 MG/ML IV SOLN
INTRAVENOUS | Status: DC | PRN
  Administered 2017-04-17: 100 mg via INTRAVENOUS
  Administered 2017-04-17: 50 mg via INTRAVENOUS
  Administered 2017-04-17: 10 mg via INTRAVENOUS
  Administered 2017-04-17: 50 mg via INTRAVENOUS
  Administered 2017-04-17: 100 mg via INTRAVENOUS

## 2017-04-17 MED ORDER — PROPOFOL 10 MG/ML IV BOLUS
INTRAVENOUS | Status: DC | PRN
  Administered 2017-04-17: 100 mg via INTRAVENOUS

## 2017-04-17 MED ORDER — MILRINONE LACTATE IN DEXTROSE 20-5 MG/100ML-% IV SOLN
0.1250 ug/kg/min | INTRAVENOUS | Status: AC
  Administered 2017-04-17: 0.25 ug/kg/min via INTRAVENOUS
  Filled 2017-04-17: qty 100

## 2017-04-17 MED ORDER — LACTATED RINGERS IV SOLN
INTRAVENOUS | Status: DC | PRN
  Administered 2017-04-17 (×2): via INTRAVENOUS

## 2017-04-17 MED ORDER — PHENYLEPHRINE 40 MCG/ML (10ML) SYRINGE FOR IV PUSH (FOR BLOOD PRESSURE SUPPORT)
PREFILLED_SYRINGE | INTRAVENOUS | Status: DC | PRN
  Administered 2017-04-17: 80 ug via INTRAVENOUS
  Administered 2017-04-17 (×2): 40 ug via INTRAVENOUS
  Administered 2017-04-17: 80 ug via INTRAVENOUS
  Administered 2017-04-17 (×3): 40 ug via INTRAVENOUS

## 2017-04-17 MED ORDER — SODIUM CHLORIDE 0.9 % IV SOLN
1.5000 g | Freq: Two times a day (BID) | INTRAVENOUS | Status: DC
  Administered 2017-04-17 – 2017-04-18 (×3): 1.5 g via INTRAVENOUS
  Filled 2017-04-17 (×4): qty 1.5

## 2017-04-17 MED ORDER — ACETAMINOPHEN 500 MG PO TABS
1000.0000 mg | ORAL_TABLET | Freq: Four times a day (QID) | ORAL | Status: DC
  Administered 2017-04-18 – 2017-04-19 (×5): 1000 mg via ORAL
  Filled 2017-04-17 (×5): qty 2

## 2017-04-17 MED ORDER — SODIUM CHLORIDE 0.9% FLUSH
10.0000 mL | Freq: Two times a day (BID) | INTRAVENOUS | Status: DC
  Administered 2017-04-17: 20 mL
  Administered 2017-04-18 – 2017-04-23 (×3): 10 mL

## 2017-04-17 MED ORDER — SODIUM CHLORIDE 0.9 % IV SOLN
0.0000 ug/kg/h | INTRAVENOUS | Status: DC
  Administered 2017-04-17: 0.5 ug/kg/h via INTRAVENOUS

## 2017-04-17 MED ORDER — POTASSIUM CHLORIDE 10 MEQ/50ML IV SOLN
10.0000 meq | INTRAVENOUS | Status: AC
  Administered 2017-04-17 (×3): 10 meq via INTRAVENOUS

## 2017-04-17 MED ORDER — ORAL CARE MOUTH RINSE
15.0000 mL | Freq: Two times a day (BID) | OROMUCOSAL | Status: DC
  Administered 2017-04-18 – 2017-04-23 (×6): 15 mL via OROMUCOSAL

## 2017-04-17 MED ORDER — PROTAMINE SULFATE 10 MG/ML IV SOLN
INTRAVENOUS | Status: AC
  Filled 2017-04-17: qty 25

## 2017-04-17 MED ORDER — ALBUMIN HUMAN 5 % IV SOLN
INTRAVENOUS | Status: DC | PRN
  Administered 2017-04-17 (×2): via INTRAVENOUS

## 2017-04-17 MED ORDER — BISACODYL 5 MG PO TBEC
10.0000 mg | DELAYED_RELEASE_TABLET | Freq: Every day | ORAL | Status: DC
  Administered 2017-04-18: 10 mg via ORAL
  Filled 2017-04-17: qty 2

## 2017-04-17 MED ORDER — ROCURONIUM BROMIDE 50 MG/5ML IV SOLN
INTRAVENOUS | Status: AC
  Filled 2017-04-17: qty 4

## 2017-04-17 MED ORDER — FENTANYL CITRATE (PF) 250 MCG/5ML IJ SOLN
INTRAMUSCULAR | Status: AC
  Filled 2017-04-17: qty 25

## 2017-04-17 MED ORDER — SODIUM CHLORIDE 0.45 % IV SOLN
INTRAVENOUS | Status: DC | PRN
  Administered 2017-04-17: 14:00:00 via INTRAVENOUS

## 2017-04-17 MED ORDER — PLASMA-LYTE 148 IV SOLN
INTRAVENOUS | Status: DC | PRN
  Administered 2017-04-17: 500 mL via INTRAVASCULAR

## 2017-04-17 MED ORDER — ALBUMIN HUMAN 5 % IV SOLN
250.0000 mL | INTRAVENOUS | Status: AC | PRN
  Administered 2017-04-17 (×2): 250 mL via INTRAVENOUS
  Filled 2017-04-17: qty 250

## 2017-04-17 MED ORDER — SODIUM CHLORIDE 0.9 % IJ SOLN
OROMUCOSAL | Status: DC | PRN
  Administered 2017-04-17 (×3): 4 mL via TOPICAL

## 2017-04-17 MED ORDER — ASPIRIN 81 MG PO CHEW: 324.0000 mg | CHEWABLE_TABLET | Freq: Every day | ORAL | Status: DC

## 2017-04-17 MED ORDER — METOPROLOL TARTRATE 12.5 MG HALF TABLET
12.5000 mg | ORAL_TABLET | Freq: Two times a day (BID) | ORAL | Status: DC
  Administered 2017-04-18 (×2): 12.5 mg via ORAL
  Filled 2017-04-17 (×2): qty 1

## 2017-04-17 MED ORDER — SODIUM CHLORIDE 0.9% FLUSH
3.0000 mL | Freq: Two times a day (BID) | INTRAVENOUS | Status: DC
  Administered 2017-04-18 (×2): 3 mL via INTRAVENOUS

## 2017-04-17 MED ORDER — ACETAMINOPHEN 160 MG/5ML PO SOLN: 1000.0000 mg | Freq: Four times a day (QID) | ORAL | Status: DC

## 2017-04-17 MED ORDER — ROCURONIUM BROMIDE 100 MG/10ML IV SOLN
INTRAVENOUS | Status: DC | PRN
  Administered 2017-04-17 (×4): 50 mg via INTRAVENOUS

## 2017-04-17 MED ORDER — PHENYLEPHRINE 40 MCG/ML (10ML) SYRINGE FOR IV PUSH (FOR BLOOD PRESSURE SUPPORT)
PREFILLED_SYRINGE | INTRAVENOUS | Status: AC
  Filled 2017-04-17: qty 10

## 2017-04-17 MED ORDER — MIDAZOLAM HCL 2 MG/2ML IJ SOLN: 2.0000 mg | INTRAMUSCULAR | Status: DC | PRN

## 2017-04-17 MED ORDER — MAGNESIUM SULFATE 4 GM/100ML IV SOLN
4.0000 g | Freq: Once | INTRAVENOUS | Status: AC
  Administered 2017-04-17: 4 g via INTRAVENOUS
  Filled 2017-04-17: qty 100

## 2017-04-17 MED ORDER — HEMOSTATIC AGENTS (NO CHARGE) OPTIME
TOPICAL | Status: DC | PRN
  Administered 2017-04-17 (×2): 1 via TOPICAL

## 2017-04-17 MED ORDER — LACTATED RINGERS IV SOLN
INTRAVENOUS | Status: DC | PRN
  Administered 2017-04-17: 08:00:00 via INTRAVENOUS

## 2017-04-17 MED ORDER — SODIUM CHLORIDE 0.9 % IV SOLN
INTRAVENOUS | Status: DC
  Administered 2017-04-17: 14:00:00 via INTRAVENOUS

## 2017-04-17 MED ORDER — SODIUM CHLORIDE 0.9 % IV SOLN
INTRAVENOUS | Status: DC
  Administered 2017-04-17: 1.2 [IU]/h via INTRAVENOUS
  Filled 2017-04-17: qty 1

## 2017-04-17 MED ORDER — SODIUM CHLORIDE 0.9% FLUSH: 3.0000 mL | INTRAVENOUS | Status: DC | PRN

## 2017-04-17 MED ORDER — CHLORHEXIDINE GLUCONATE 0.12 % MT SOLN
15.0000 mL | OROMUCOSAL | Status: AC
  Administered 2017-04-17: 15 mL via OROMUCOSAL

## 2017-04-17 MED ORDER — SUCCINYLCHOLINE CHLORIDE 200 MG/10ML IV SOSY
PREFILLED_SYRINGE | INTRAVENOUS | Status: AC
Start: 2017-04-17 — End: 2017-04-17
  Filled 2017-04-17: qty 10

## 2017-04-17 MED ORDER — DOCUSATE SODIUM 100 MG PO CAPS
200.0000 mg | ORAL_CAPSULE | Freq: Every day | ORAL | Status: DC
  Administered 2017-04-18: 200 mg via ORAL
  Filled 2017-04-17: qty 2

## 2017-04-17 MED ORDER — DEXAMETHASONE SODIUM PHOSPHATE 10 MG/ML IJ SOLN
INTRAMUSCULAR | Status: DC | PRN
  Administered 2017-04-17: 10 mg via INTRAVENOUS

## 2017-04-17 MED ORDER — METOPROLOL TARTRATE 5 MG/5ML IV SOLN: 2.5000 mg | INTRAVENOUS | Status: DC | PRN

## 2017-04-17 MED ORDER — FENTANYL CITRATE (PF) 250 MCG/5ML IJ SOLN
INTRAMUSCULAR | Status: DC | PRN
  Administered 2017-04-17: 100 ug via INTRAVENOUS
  Administered 2017-04-17: 150 ug via INTRAVENOUS
  Administered 2017-04-17: 50 ug via INTRAVENOUS
  Administered 2017-04-17 (×2): 150 ug via INTRAVENOUS
  Administered 2017-04-17: 450 ug via INTRAVENOUS
  Administered 2017-04-17: 100 ug via INTRAVENOUS
  Administered 2017-04-17 (×2): 50 ug via INTRAVENOUS

## 2017-04-17 MED ORDER — LACTATED RINGERS IV SOLN: INTRAVENOUS | Status: DC

## 2017-04-17 MED ORDER — SODIUM CHLORIDE 0.9 % IV SOLN
250.0000 mL | INTRAVENOUS | Status: DC
  Administered 2017-04-18: 250 mL via INTRAVENOUS

## 2017-04-17 MED ORDER — LIDOCAINE 2% (20 MG/ML) 5 ML SYRINGE
INTRAMUSCULAR | Status: AC
  Filled 2017-04-17: qty 5

## 2017-04-17 MED ORDER — HEPARIN SODIUM (PORCINE) 1000 UNIT/ML IJ SOLN
INTRAMUSCULAR | Status: AC
  Filled 2017-04-17: qty 1

## 2017-04-17 MED ORDER — OXYCODONE HCL 5 MG PO TABS
5.0000 mg | ORAL_TABLET | ORAL | Status: DC | PRN
  Administered 2017-04-18: 5 mg via ORAL
  Administered 2017-04-19: 10 mg via ORAL
  Filled 2017-04-17: qty 1
  Filled 2017-04-17: qty 2

## 2017-04-17 MED ORDER — BISACODYL 10 MG RE SUPP: 10.0000 mg | Freq: Every day | RECTAL | Status: DC

## 2017-04-17 MED ORDER — ACETAMINOPHEN 160 MG/5ML PO SOLN: 650.0000 mg | Freq: Once | ORAL | Status: AC

## 2017-04-17 MED ORDER — METOPROLOL TARTRATE 25 MG/10 ML ORAL SUSPENSION
12.5000 mg | Freq: Two times a day (BID) | ORAL | Status: DC
  Filled 2017-04-17: qty 5

## 2017-04-17 MED ORDER — ASPIRIN EC 325 MG PO TBEC
325.0000 mg | DELAYED_RELEASE_TABLET | Freq: Every day | ORAL | Status: DC
  Administered 2017-04-18: 325 mg via ORAL
  Filled 2017-04-17: qty 1

## 2017-04-17 MED ORDER — PANTOPRAZOLE SODIUM 40 MG PO TBEC: 40.0000 mg | DELAYED_RELEASE_TABLET | Freq: Every day | ORAL | Status: DC

## 2017-04-17 MED ORDER — LACTATED RINGERS IV SOLN
INTRAVENOUS | Status: DC
  Administered 2017-04-17 (×2): via INTRAVENOUS

## 2017-04-17 MED ORDER — TRAMADOL HCL 50 MG PO TABS
50.0000 mg | ORAL_TABLET | ORAL | Status: DC | PRN
  Administered 2017-04-18: 50 mg via ORAL
  Filled 2017-04-17: qty 1

## 2017-04-17 MED ORDER — LACTATED RINGERS IV SOLN: 500.0000 mL | Freq: Once | INTRAVENOUS | Status: DC | PRN

## 2017-04-17 MED ORDER — ORAL CARE MOUTH RINSE
15.0000 mL | OROMUCOSAL | Status: DC
  Administered 2017-04-17 (×2): 15 mL via OROMUCOSAL

## 2017-04-17 MED ORDER — DOPAMINE-DEXTROSE 3.2-5 MG/ML-% IV SOLN: 3.0000 ug/kg/min | INTRAVENOUS | Status: AC

## 2017-04-17 MED ORDER — ACETAMINOPHEN 650 MG RE SUPP
650.0000 mg | Freq: Once | RECTAL | Status: AC
  Administered 2017-04-17: 650 mg via RECTAL

## 2017-04-17 MED ORDER — LIDOCAINE HCL (CARDIAC) 20 MG/ML IV SOLN
INTRAVENOUS | Status: DC | PRN
  Administered 2017-04-17: 100 mg via INTRAVENOUS

## 2017-04-17 MED ORDER — SODIUM CHLORIDE 0.9% FLUSH: 10.0000 mL | INTRAVENOUS | Status: DC | PRN

## 2017-04-17 MED ORDER — FAMOTIDINE IN NACL 20-0.9 MG/50ML-% IV SOLN
20.0000 mg | Freq: Two times a day (BID) | INTRAVENOUS | Status: AC
  Administered 2017-04-17: 20 mg via INTRAVENOUS
  Filled 2017-04-17: qty 50

## 2017-04-17 MED ORDER — MORPHINE SULFATE (PF) 4 MG/ML IV SOLN: 1.0000 mg | INTRAVENOUS | Status: AC | PRN

## 2017-04-17 MED ORDER — MIDAZOLAM HCL 5 MG/5ML IJ SOLN
INTRAMUSCULAR | Status: DC | PRN
  Administered 2017-04-17: 2 mg via INTRAVENOUS
  Administered 2017-04-17 (×2): 1 mg via INTRAVENOUS
  Administered 2017-04-17: 3 mg via INTRAVENOUS

## 2017-04-17 MED ORDER — SUCCINYLCHOLINE CHLORIDE 20 MG/ML IJ SOLN
INTRAMUSCULAR | Status: DC | PRN
  Administered 2017-04-17: 100 mg via INTRAVENOUS

## 2017-04-17 MED ORDER — CHLORHEXIDINE GLUCONATE CLOTH 2 % EX PADS
6.0000 | MEDICATED_PAD | Freq: Every day | CUTANEOUS | Status: DC
  Administered 2017-04-17 – 2017-04-19 (×3): 6 via TOPICAL

## 2017-04-17 MED ORDER — 0.9 % SODIUM CHLORIDE (POUR BTL) OPTIME
TOPICAL | Status: DC | PRN
  Administered 2017-04-17: 6000 mL

## 2017-04-17 MED FILL — Potassium Chloride Inj 2 mEq/ML: INTRAVENOUS | Qty: 40 | Status: AC

## 2017-04-17 MED FILL — Magnesium Sulfate Inj 50%: INTRAMUSCULAR | Qty: 10 | Status: AC

## 2017-04-17 MED FILL — Lidocaine HCl Local Inj 1%: INTRAMUSCULAR | Qty: 20 | Status: AC

## 2017-04-17 MED FILL — Heparin Sodium (Porcine) 2 Unit/ML in Sodium Chloride 0.9%: INTRAMUSCULAR | Qty: 1000 | Status: AC

## 2017-04-17 MED FILL — Heparin Sodium (Porcine) Inj 1000 Unit/ML: INTRAMUSCULAR | Qty: 30 | Status: AC

## 2017-04-17 SURGICAL SUPPLY — 72 items
BAG DECANTER FOR FLEXI CONT (MISCELLANEOUS) ×3 IMPLANT
BANDAGE ACE 4X5 VEL STRL LF (GAUZE/BANDAGES/DRESSINGS) ×3 IMPLANT
BANDAGE ACE 6X5 VEL STRL LF (GAUZE/BANDAGES/DRESSINGS) ×3 IMPLANT
BLADE STERNUM SYSTEM 6 (BLADE) ×3 IMPLANT
BNDG GAUZE ELAST 4 BULKY (GAUZE/BANDAGES/DRESSINGS) ×3 IMPLANT
CANISTER SUCT 3000ML PPV (MISCELLANEOUS) ×3 IMPLANT
CATH CPB KIT GERHARDT (MISCELLANEOUS) ×3 IMPLANT
CATH THORACIC 28FR (CATHETERS) ×3 IMPLANT
CLIP FOGARTY SPRING 6M (CLIP) ×1 IMPLANT
CLIP VESOCCLUDE SM WIDE 24/CT (CLIP) ×2 IMPLANT
CRADLE DONUT ADULT HEAD (MISCELLANEOUS) ×3 IMPLANT
DRAIN CHANNEL 28F RND 3/8 FF (WOUND CARE) ×3 IMPLANT
DRAPE CARDIOVASCULAR INCISE (DRAPES) ×3
DRAPE SLUSH/WARMER DISC (DRAPES) ×3 IMPLANT
DRAPE SRG 135X102X78XABS (DRAPES) ×2 IMPLANT
DRSG AQUACEL AG ADV 3.5X14 (GAUZE/BANDAGES/DRESSINGS) ×3 IMPLANT
ELECT BLADE 4.0 EZ CLEAN MEGAD (MISCELLANEOUS) ×3
ELECT REM PT RETURN 9FT ADLT (ELECTROSURGICAL) ×6
ELECTRODE BLDE 4.0 EZ CLN MEGD (MISCELLANEOUS) ×2 IMPLANT
ELECTRODE REM PT RTRN 9FT ADLT (ELECTROSURGICAL) ×4 IMPLANT
FELT TEFLON 1X6 (MISCELLANEOUS) ×6 IMPLANT
FLOSEAL 10ML (HEMOSTASIS) ×1 IMPLANT
GAUZE SPONGE 4X4 12PLY STRL (GAUZE/BANDAGES/DRESSINGS) ×6 IMPLANT
GLOVE BIO SURGEON STRL SZ 6 (GLOVE) ×2 IMPLANT
GLOVE BIO SURGEON STRL SZ 6.5 (GLOVE) ×14 IMPLANT
GLOVE BIOGEL PI IND STRL 8.5 (GLOVE) IMPLANT
GLOVE BIOGEL PI INDICATOR 8.5 (GLOVE) ×1
GLOVE SURG SS PI 6.0 STRL IVOR (GLOVE) ×2 IMPLANT
GOWN STRL REUS W/ TWL LRG LVL3 (GOWN DISPOSABLE) ×8 IMPLANT
GOWN STRL REUS W/TWL LRG LVL3 (GOWN DISPOSABLE) ×30
HEMOSTAT POWDER SURGIFOAM 1G (HEMOSTASIS) ×9 IMPLANT
HEMOSTAT SURGICEL 2X14 (HEMOSTASIS) ×3 IMPLANT
KIT BASIN OR (CUSTOM PROCEDURE TRAY) ×3 IMPLANT
KIT CATH SUCT 8FR (CATHETERS) ×3 IMPLANT
KIT ROOM TURNOVER OR (KITS) ×3 IMPLANT
KIT SUCTION CATH 14FR (SUCTIONS) ×6 IMPLANT
KIT VASOVIEW HEMOPRO VH 3000 (KITS) ×3 IMPLANT
LEAD PACING MYOCARDI (MISCELLANEOUS) ×3 IMPLANT
MARKER GRAFT CORONARY BYPASS (MISCELLANEOUS) ×9 IMPLANT
NS IRRIG 1000ML POUR BTL (IV SOLUTION) ×16 IMPLANT
PACK E OPEN HEART (SUTURE) ×3 IMPLANT
PACK OPEN HEART (CUSTOM PROCEDURE TRAY) ×3 IMPLANT
PAD ARMBOARD 7.5X6 YLW CONV (MISCELLANEOUS) ×6 IMPLANT
PAD ELECT DEFIB RADIOL ZOLL (MISCELLANEOUS) ×3 IMPLANT
PENCIL BUTTON HOLSTER BLD 10FT (ELECTRODE) ×3 IMPLANT
PUNCH AORTIC ROT 4.0MM RCL 40 (MISCELLANEOUS) ×1 IMPLANT
PUNCH AORTIC ROTATE  4.5MM 8IN (MISCELLANEOUS) ×1 IMPLANT
SET CARDIOPLEGIA MPS 5001102 (MISCELLANEOUS) ×1 IMPLANT
SPONGE LAP 18X18 X RAY DECT (DISPOSABLE) ×2 IMPLANT
SUT BONE WAX W31G (SUTURE) ×4 IMPLANT
SUT MNCRL AB 4-0 PS2 18 (SUTURE) ×1 IMPLANT
SUT PROLENE 3 0 SH1 36 (SUTURE) ×3 IMPLANT
SUT PROLENE 4 0 TF (SUTURE) ×6 IMPLANT
SUT PROLENE 5 0 C 1 36 (SUTURE) ×1 IMPLANT
SUT PROLENE 6 0 C 1 30 (SUTURE) ×2 IMPLANT
SUT PROLENE 6 0 CC (SUTURE) ×8 IMPLANT
SUT PROLENE 7 0 BV1 MDA (SUTURE) ×4 IMPLANT
SUT PROLENE 8 0 BV175 6 (SUTURE) ×2 IMPLANT
SUT STEEL 6MS V (SUTURE) ×3 IMPLANT
SUT STEEL SZ 6 DBL 3X14 BALL (SUTURE) ×3 IMPLANT
SUT VIC AB 1 CTX 18 (SUTURE) ×6 IMPLANT
SUT VIC AB 2-0 CT1 27 (SUTURE) ×3
SUT VIC AB 2-0 CT1 TAPERPNT 27 (SUTURE) IMPLANT
SYSTEM SAHARA CHEST DRAIN ATS (WOUND CARE) ×3 IMPLANT
TAPE CLOTH SURG 4X10 WHT LF (GAUZE/BANDAGES/DRESSINGS) ×1 IMPLANT
TAPE PAPER 2X10 WHT MICROPORE (GAUZE/BANDAGES/DRESSINGS) ×1 IMPLANT
TOWEL GREEN STERILE (TOWEL DISPOSABLE) ×3 IMPLANT
TOWEL GREEN STERILE FF (TOWEL DISPOSABLE) ×3 IMPLANT
TRAY FOLEY SILVER 16FR TEMP (SET/KITS/TRAYS/PACK) ×3 IMPLANT
TUBING INSUFFLATION (TUBING) ×3 IMPLANT
UNDERPAD 30X30 (UNDERPADS AND DIAPERS) ×3 IMPLANT
WATER STERILE IRR 1000ML POUR (IV SOLUTION) ×6 IMPLANT

## 2017-04-17 NOTE — Anesthesia Procedure Notes (Signed)
Arterial Line Insertion Start/End2/27/2019 6:50 AM, 04/17/2017 6:55 AM Performed by: Teressa Lower., CRNA, CRNA  Patient location: Pre-op. Preanesthetic checklist: patient identified, IV checked, site marked, risks and benefits discussed, surgical consent, monitors and equipment checked, pre-op evaluation, timeout performed and anesthesia consent Lidocaine 1% used for infiltration Left, radial was placed Catheter size: 20 G Hand hygiene performed , maximum sterile barriers used  and Seldinger technique used Allen's test indicative of satisfactory collateral circulation Attempts: 1 Procedure performed without using ultrasound guided technique. Following insertion, dressing applied and Biopatch. Post procedure assessment: normal and unchanged  Patient tolerated the procedure well with no immediate complications.

## 2017-04-17 NOTE — Progress Notes (Addendum)
Patient awoke with encouragement, staying awake longer, can lift head off pillow and hold, moving all extremities , but  Soon goes beck to eyes closed, breathing over vent . RT called for weaning.

## 2017-04-17 NOTE — Procedures (Addendum)
Extubation Procedure Note  Patient Details:   Name: [name] DOB: April 27, 1940 MRN: [id number]   Airway Documentation:     Evaluation  O2 sats: stable throughout Complications: No apparent complications Patient did tolerate procedure well. Bilateral Breath Sounds: Clear, Diminished   Yes   Pulmonary mechanics done with good pt effort and wife at bedside to translate prior to extubation. NIF- 32 and 1.4L on VC. Pt had positive cuff leak and able to speak after, voice just hoarse. EXtubated to 4L nasal cannula with humidity. RN currently at bedside with IS and Pt achieved 750. RT to cont to monitor.   Darryl Nestle F 04/17/2017, 8:48 PM

## 2017-04-17 NOTE — Transfer of Care (Signed)
Immediate Anesthesia Transfer of Care Note  Patient: [name]  Procedure(s) Performed: CORONARY ARTERY BYPASS GRAFTING (CABG) x4 , using left internal mammary artery  to LAD and right leg greater saphenous vein harvested endoscopically  to PDA, Diagonal I and Circumflex (N/A Chest) TRANSESOPHAGEAL ECHOCARDIOGRAM (TEE) (N/A )  Patient Location: SICU  Anesthesia Type:General  Level of Consciousness: Patient remains intubated per anesthesia plan  Airway & Oxygen Therapy: Patient remains intubated per anesthesia plan and Patient placed on Ventilator (see vital sign flow sheet for setting)  Post-op Assessment: Report given to RN and Post -op Vital signs reviewed and stable  Post vital signs: Reviewed and stable  Last Vitals:  Vitals:   04/16/17 2333 04/17/17 0458  BP: 128/63 (!) 155/95  Pulse: 70 85  Resp: 20 (!) 21  Temp: 37.2 C 37.8 C  SpO2: 91% 94%    Last Pain:  Vitals:   04/17/17 0458  TempSrc: Oral  PainSc: 0-No pain      Patients Stated Pain Goal: 0 (50/03/70 4888)  Complications: No apparent anesthesia complications

## 2017-04-17 NOTE — Progress Notes (Signed)
Patient ID: [name], male   DOB: [birth year], 77 y.o.   MRN: [id number] EVENING ROUNDS NOTE :     [address]       [place] [address]             [phone]                 Day of Surgery Procedure(s) (LRB): CORONARY ARTERY BYPASS GRAFTING (CABG) x4 , using left internal mammary artery  to LAD and right leg greater saphenous vein harvested endoscopically  to PDA, Diagonal I and Circumflex (N/A) TRANSESOPHAGEAL ECHOCARDIOGRAM (TEE) (N/A)  Total Length of Stay:  LOS: 2 days  BP (!) 108/58 (BP Location: Right Arm)   Pulse 80   Temp 98.4 F (36.9 C)   Resp 10   Ht 5\' 6"  (1.676 m)   Wt 189 lb 9.5 oz (86 kg)   SpO2 94%   BMI 30.60 kg/m   .Intake/Output      02/26 0701 - 02/27 0700 02/27 0701 - 02/28 0700   P.O. 360    I.V. (mL/kg) 342.7 (4) 2347.4 (27.3)   Blood  400   IV Piggyback  600   Total Intake(mL/kg) 702.7 (8.2) 3347.4 (38.9)   Urine (mL/kg/hr) 1275 (0.6) 1625 (1.6)   Stool 0    Blood  500   Chest Tube  105   Total Output 1275 2230   Net -572.3 +1117.4        Urine Occurrence 3 x    Stool Occurrence 1 x      . sodium chloride 20 mL/hr at 04/17/17 1800  . [START ON 04/18/2017] sodium chloride    . sodium chloride    . albumin human    . cefUROXime (ZINACEF)  IV    . dexmedetomidine (PRECEDEX) IV infusion Stopped (04/17/17 1554)  . DOPamine 3 mcg/kg/min (04/17/17 1800)  . famotidine (PEPCID) IV Stopped (04/17/17 1423)  . insulin (NOVOLIN-R) infusion 3 Units/hr (04/17/17 1851)  . lactated ringers    . lactated ringers    . lactated ringers 20 mL/hr at 04/17/17 1800  . magnesium sulfate 4 g (04/17/17 1415)  . milrinone 0.3 mcg/kg/min (04/17/17 1800)  . nitroGLYCERIN Stopped (04/17/17 1524)  . phenylephrine (NEO-SYNEPHRINE) Adult infusion 30 mcg/min (04/17/17 1800)  . vancomycin       Lab Results  Component Value Date   WBC 10.5 04/17/2017   HGB 10.3 (L) 04/17/2017   HCT 28.9 (L) 04/17/2017   PLT 91 (L) 04/17/2017   GLUCOSE  131 (H) 04/17/2017   CHOL 174 04/15/2017   TRIG 46 04/15/2017   HDL 39 (L) 04/15/2017   LDLDIRECT 150.8 06/30/2008   LDLCALC 126 (H) 04/15/2017   ALT 17 04/15/2017   AST 31 04/15/2017   NA 142 04/17/2017   K 3.0 (L) 04/17/2017   CL 103 04/17/2017   CREATININE 0.70 04/17/2017   BUN 7 04/17/2017   CO2 23 04/17/2017   TSH 4.888 (H) 04/17/2012   PSA 1.46 05/31/2014   INR 1.55 04/17/2017   HGBA1C 5.9 (H) 04/15/2017   Waking up, neuro intact Not bleeding   [name] MD  Beeper [phone] Office [phone] 04/17/2017 6:56 PM

## 2017-04-17 NOTE — OR Nursing (Signed)
12:30 - 45 minute call to SICU nurse

## 2017-04-17 NOTE — Anesthesia Procedure Notes (Signed)
Central Venous Catheter Insertion Performed by: Roderic Palau, MD, anesthesiologist Start/End2/27/2019 6:30 AM, 04/17/2017 6:45 AM Patient location: Pre-op. Preanesthetic checklist: patient identified, IV checked, site marked, risks and benefits discussed, surgical consent, monitors and equipment checked, pre-op evaluation, timeout performed and anesthesia consent Hand hygiene performed  and maximum sterile barriers used  PA cath was placed.Swan type:thermodilution Procedure performed without using ultrasound guided technique. Attempts: 1 Patient tolerated the procedure well with no immediate complications.

## 2017-04-17 NOTE — Progress Notes (Signed)
Temperature good after baer hugger on. Patient still sleepy, but roused to command, moving all extremities, clutching chest, reassured patient, back to eyes closed quickly. BP more stable.

## 2017-04-17 NOTE — Anesthesia Postprocedure Evaluation (Signed)
Anesthesia Post Note  Patient: [name]  Procedure(s) Performed: CORONARY ARTERY BYPASS GRAFTING (CABG) x4 , using left internal mammary artery  to LAD and right leg greater saphenous vein harvested endoscopically  to PDA, Diagonal I and Circumflex (N/A Chest) TRANSESOPHAGEAL ECHOCARDIOGRAM (TEE) (N/A )     Patient location during evaluation: SICU Anesthesia Type: General Level of consciousness: sedated Pain management: pain level controlled Vital Signs Assessment: post-procedure vital signs reviewed and stable Respiratory status: patient remains intubated per anesthesia plan Cardiovascular status: stable Postop Assessment: no apparent nausea or vomiting Anesthetic complications: no    Last Vitals:  Vitals:   04/17/17 1415 04/17/17 1426  BP:    Pulse:    Resp: 12   Temp: (!) 35.3 C   SpO2: 100% 100%    Last Pain:  Vitals:   04/17/17 0458  TempSrc: Oral  PainSc: 0-No pain                 Tiajuana Amass

## 2017-04-17 NOTE — Progress Notes (Signed)
Admitted to 2 H post operative from OR> Titrating neosynephrine and nTG as appropriate based on parameters. CO > 4. BP labile,  On Atrial pacer at 86

## 2017-04-17 NOTE — Anesthesia Procedure Notes (Signed)
Procedure Name: Intubation Date/Time: 04/17/2017 8:04 AM Performed by: Teressa Lower., CRNA Pre-anesthesia Checklist: Patient identified, Emergency Drugs available, Suction available and Patient being monitored Patient Re-evaluated:Patient Re-evaluated prior to induction Oxygen Delivery Method: Circle system utilized Preoxygenation: Pre-oxygenation with 100% oxygen Induction Type: IV induction Ventilation: Mask ventilation without difficulty Laryngoscope Size: Miller and 2 Grade View: Grade I Tube type: Oral Tube size: 8.0 mm Number of attempts: 1 Airway Equipment and Method: Stylet and Oral airway Placement Confirmation: ETT inserted through vocal cords under direct vision,  positive ETCO2 and breath sounds checked- equal and bilateral Secured at: 23 cm Tube secured with: Tape Dental Injury: Teeth and Oropharynx as per pre-operative assessment

## 2017-04-17 NOTE — Progress Notes (Signed)
  Echocardiogram Echocardiogram Transesophageal has been performed.  [name] 04/17/2017, 8:29 AM

## 2017-04-17 NOTE — Brief Op Note (Signed)
      [address]       [place] [address]             [phone]     04/17/2017  1:12 PM  PATIENT:  [name]  77 y.o. male  PRE-OPERATIVE DIAGNOSIS:  CAD LEFT MAIN  POST-OPERATIVE DIAGNOSIS:  CAD LEFT MAIN  PROCEDURE:  Procedure(s): CORONARY ARTERY BYPASS GRAFTING (CABG) x4 , using left internal mammary artery  to LAD and right leg greater saphenous vein harvested endoscopically  to PDA, Diagonal I and Circumflex (N/A) TRANSESOPHAGEAL ECHOCARDIOGRAM (TEE) (N/A)  LIMA to LAD SVG to Diag 1 SVG to distal circ SVG to PDA  SURGEON:  Surgeon(s) and Role:    * [name], MD - Primary  PHYSICIAN ASSISTANT:  Nicholes Rough, PA-C   ANESTHESIA:   general  EBL:  500 mL   BLOOD ADMINISTERED:none  DRAINS: ROUTINE   LOCAL MEDICATIONS USED:  NONE  SPECIMEN:  No Specimen  DISPOSITION OF SPECIMEN:  PATHOLOGY  COUNTS:  YES  DICTATION: .Dragon Dictation  PLAN OF CARE: Admit to inpatient   PATIENT DISPOSITION:  ICU - intubated and hemodynamically stable.   Delay start of Pharmacological VTE agent (>24hrs) due to surgical blood loss or risk of bleeding: yes

## 2017-04-17 NOTE — Plan of Care (Signed)
  Progressing Activity: Risk for activity intolerance will decrease 04/17/2017 2303 - Progressing by Netta Corrigan, RN Note Pt dangled on edge of bed without any complications.  Respiratory: Respiratory status will improve 04/17/2017 2303 - Progressing by Netta Corrigan, RN Note Although patient was not extubated in the 6 hour window, his ABGs were within parameters and is tolerating 6L [place] well. Gets incentive spirometer up to 1000 Urinary Elimination: Ability to achieve and maintain adequate renal perfusion and functioning will improve 04/17/2017 2303 - Progressing by Netta Corrigan, RN Note Pt has made adequate urine during the immediate post operative period.

## 2017-04-17 NOTE — Progress Notes (Signed)
      [address]       [place] [address]             [phone]    Pre Procedure note    [name] has been scheduled for Procedure(s): CORONARY ARTERY BYPASS GRAFTING (CABG) (N/A) TRANSESOPHAGEAL ECHOCARDIOGRAM (TEE) (N/A) today. The various methods of treatment have been discussed with the patient. After consideration of the risks, benefits and treatment options the patient has consented to the planned procedure.   The patient has been seen and labs reviewed. There are no changes in the patient's condition to prevent proceeding with the planned procedure today.  Recent labs:  Lab Results  Component Value Date   WBC 10.5 04/17/2017   HGB 13.6 04/17/2017   HCT 39.6 04/17/2017   PLT 183 04/17/2017   GLUCOSE 136 (H) 04/17/2017   CHOL 174 04/15/2017   TRIG 46 04/15/2017   HDL 39 (L) 04/15/2017   LDLDIRECT 150.8 06/30/2008   LDLCALC 126 (H) 04/15/2017   ALT 17 04/15/2017   AST 31 04/15/2017   NA 136 04/17/2017   K 3.4 (L) 04/17/2017   CL 104 04/17/2017   CREATININE 0.89 04/17/2017   BUN 8 04/17/2017   CO2 23 04/17/2017   TSH 4.888 (H) 04/17/2012   PSA 1.46 05/31/2014   INR 1.04 04/16/2017   HGBA1C 5.9 (H) 04/15/2017    [name], MD 04/17/2017 6:53 AM

## 2017-04-17 NOTE — Progress Notes (Signed)
Dr Servando Snare in aware of patient status, lab work. Will monitor.

## 2017-04-17 NOTE — Anesthesia Procedure Notes (Signed)
Central Venous Catheter Insertion Performed by: Roderic Palau, MD, anesthesiologist Start/End2/27/2019 6:30 AM, 04/17/2017 6:45 AM Patient location: Pre-op. Preanesthetic checklist: patient identified, IV checked, site marked, risks and benefits discussed, surgical consent, monitors and equipment checked, pre-op evaluation, timeout performed and anesthesia consent Position: Trendelenburg Lidocaine 1% used for infiltration and patient sedated Hand hygiene performed , maximum sterile barriers used  and Seldinger technique used Catheter size: 8.5 Fr Total catheter length 10. Central line was placed.Sheath introducer Swan type:thermodilution Procedure performed using ultrasound guided technique. Ultrasound Notes:anatomy identified, needle tip was noted to be adjacent to the nerve/plexus identified, no ultrasound evidence of intravascular and/or intraneural injection and image(s) printed for medical record Attempts: 1 Following insertion, line sutured, dressing applied and Biopatch. Post procedure assessment: blood return through all ports, free fluid flow and no air  Patient tolerated the procedure well with no immediate complications.

## 2017-04-17 NOTE — Plan of Care (Signed)
Post cardiac surgery CABG. Hemosynamically on milnerone and neosynephrine. Still sleepy. O2 sats on 50% 94%. Plan to extubate today and progress according to protocol post op. Has good support at home for recovery.

## 2017-04-17 NOTE — Progress Notes (Signed)
Patient not seen. S/P CABG. Dr Servando Snare will take over patient care. [facility] will sign off.

## 2017-04-17 NOTE — OR Nursing (Signed)
13:00 - 20 minute call to SICU nurse

## 2017-04-18 ENCOUNTER — Encounter (HOSPITAL_COMMUNITY): Payer: Self-pay | Admitting: Cardiothoracic Surgery

## 2017-04-18 ENCOUNTER — Inpatient Hospital Stay (HOSPITAL_COMMUNITY): Payer: Medicare HMO

## 2017-04-18 ENCOUNTER — Encounter (HOSPITAL_COMMUNITY): Payer: Self-pay

## 2017-04-18 LAB — POCT I-STAT, CHEM 8
BUN: 14 mg/dL (ref 6–20)
CALCIUM ION: 1.25 mmol/L (ref 1.15–1.40)
CHLORIDE: 103 mmol/L (ref 101–111)
CREATININE: 0.8 mg/dL (ref 0.61–1.24)
Glucose, Bld: 210 mg/dL — ABNORMAL HIGH (ref 65–99)
HEMATOCRIT: 31 % — AB (ref 39.0–52.0)
Hemoglobin: 10.5 g/dL — ABNORMAL LOW (ref 13.0–17.0)
Potassium: 3.5 mmol/L (ref 3.5–5.1)
SODIUM: 140 mmol/L (ref 135–145)
TCO2: 23 mmol/L (ref 22–32)

## 2017-04-18 LAB — COOXEMETRY PANEL
Carboxyhemoglobin: 1.2 % (ref 0.5–1.5)
Methemoglobin: 1.4 % (ref 0.0–1.5)
O2 Saturation: 70.4 %
Total hemoglobin: 10.8 g/dL — ABNORMAL LOW (ref 12.0–16.0)

## 2017-04-18 LAB — CBC
HEMATOCRIT: 31 % — AB (ref 39.0–52.0)
HEMATOCRIT: 31.5 % — AB (ref 39.0–52.0)
HEMOGLOBIN: 10.8 g/dL — AB (ref 13.0–17.0)
HEMOGLOBIN: 10.7 g/dL — AB (ref 13.0–17.0)
MCH: 31.5 pg (ref 26.0–34.0)
MCH: 31.1 pg (ref 26.0–34.0)
MCHC: 34.8 g/dL (ref 30.0–36.0)
MCHC: 34 g/dL (ref 30.0–36.0)
MCV: 90.4 fL (ref 78.0–100.0)
MCV: 91.6 fL (ref 78.0–100.0)
Platelets: 118 10*3/uL — ABNORMAL LOW (ref 150–400)
Platelets: 125 10*3/uL — ABNORMAL LOW (ref 150–400)
RBC: 3.43 MIL/uL — ABNORMAL LOW (ref 4.22–5.81)
RBC: 3.44 MIL/uL — AB (ref 4.22–5.81)
RDW: 13.7 % (ref 11.5–15.5)
RDW: 14 % (ref 11.5–15.5)
WBC: 14 10*3/uL — ABNORMAL HIGH (ref 4.0–10.5)
WBC: 17.7 10*3/uL — ABNORMAL HIGH (ref 4.0–10.5)

## 2017-04-18 LAB — BASIC METABOLIC PANEL
Anion gap: 7 (ref 5–15)
BUN: 10 mg/dL (ref 6–20)
CHLORIDE: 108 mmol/L (ref 101–111)
CO2: 23 mmol/L (ref 22–32)
CREATININE: 0.82 mg/dL (ref 0.61–1.24)
Calcium: 8.4 mg/dL — ABNORMAL LOW (ref 8.9–10.3)
GFR calc Af Amer: >60 mL/min (ref >60)
GFR calc non Af Amer: >60 mL/min (ref >60)
Glucose, Bld: 131 mg/dL — ABNORMAL HIGH (ref 65–99)
Potassium: 3.8 mmol/L (ref 3.5–5.1)
Sodium: 138 mmol/L (ref 135–145)

## 2017-04-18 LAB — GLUCOSE, CAPILLARY
GLUCOSE-CAPILLARY: 166 mg/dL — AB (ref 65–99)
GLUCOSE-CAPILLARY: 196 mg/dL — AB (ref 65–99)
GLUCOSE-CAPILLARY: 73 mg/dL (ref 65–99)
GLUCOSE-CAPILLARY: 94 mg/dL (ref 65–99)
Glucose-Capillary: 108 mg/dL — ABNORMAL HIGH (ref 65–99)
Glucose-Capillary: 114 mg/dL — ABNORMAL HIGH (ref 65–99)
Glucose-Capillary: 127 mg/dL — ABNORMAL HIGH (ref 65–99)
Glucose-Capillary: 144 mg/dL — ABNORMAL HIGH (ref 65–99)
Glucose-Capillary: 165 mg/dL — ABNORMAL HIGH (ref 65–99)
Glucose-Capillary: 209 mg/dL — ABNORMAL HIGH (ref 65–99)

## 2017-04-18 LAB — POCT I-STAT 4, (NA,K, GLUC, HGB,HCT)
Glucose, Bld: 120 mg/dL — ABNORMAL HIGH (ref 65–99)
HEMATOCRIT: 28 % — AB (ref 39.0–52.0)
Hemoglobin: 9.5 g/dL — ABNORMAL LOW (ref 13.0–17.0)
POTASSIUM: 3 mmol/L — AB (ref 3.5–5.1)
SODIUM: 145 mmol/L (ref 135–145)

## 2017-04-18 LAB — MAGNESIUM
MAGNESIUM: 2.4 mg/dL (ref 1.7–2.4)
Magnesium: 2.6 mg/dL — ABNORMAL HIGH (ref 1.7–2.4)

## 2017-04-18 LAB — ECHO TEE: P 1/2 time: 638 ms

## 2017-04-18 LAB — CREATININE, SERUM
Creatinine, Ser: 1.14 mg/dL (ref 0.61–1.24)
GFR calc Af Amer: >60 mL/min (ref >60)

## 2017-04-18 MED ORDER — ENOXAPARIN SODIUM 30 MG/0.3ML ~~LOC~~ SOLN
30.0000 mg | Freq: Every day | SUBCUTANEOUS | Status: DC
  Administered 2017-04-18: 30 mg via SUBCUTANEOUS
  Filled 2017-04-18: qty 0.3

## 2017-04-18 MED ORDER — CLOPIDOGREL BISULFATE 75 MG PO TABS
75.0000 mg | ORAL_TABLET | Freq: Every day | ORAL | Status: DC
  Administered 2017-04-18 – 2017-04-24 (×7): 75 mg via ORAL
  Filled 2017-04-18 (×7): qty 1

## 2017-04-18 MED ORDER — INSULIN ASPART 100 UNIT/ML ~~LOC~~ SOLN
0.0000 [IU] | SUBCUTANEOUS | Status: DC
Start: 2017-04-18 — End: 2017-04-19
  Administered 2017-04-18: 2 [IU] via SUBCUTANEOUS
  Administered 2017-04-18: 8 [IU] via SUBCUTANEOUS
  Administered 2017-04-18 (×2): 4 [IU] via SUBCUTANEOUS
  Administered 2017-04-18: 2 [IU] via SUBCUTANEOUS
  Administered 2017-04-18: 4 [IU] via SUBCUTANEOUS
  Administered 2017-04-19 (×2): 2 [IU] via SUBCUTANEOUS

## 2017-04-18 MED ORDER — POTASSIUM CHLORIDE 10 MEQ/50ML IV SOLN
10.0000 meq | INTRAVENOUS | Status: AC | PRN
  Administered 2017-04-18 – 2017-04-19 (×3): 10 meq via INTRAVENOUS
  Filled 2017-04-18 (×6): qty 50

## 2017-04-18 MED ORDER — FUROSEMIDE 10 MG/ML IJ SOLN
40.0000 mg | Freq: Once | INTRAMUSCULAR | Status: AC
  Administered 2017-04-18: 40 mg via INTRAVENOUS
  Filled 2017-04-18: qty 4

## 2017-04-18 MED ORDER — INSULIN ASPART 100 UNIT/ML ~~LOC~~ SOLN: 0.0000 [IU] | SUBCUTANEOUS | Status: DC

## 2017-04-18 NOTE — Op Note (Signed)
NAMEOAK, DOREY NO.:  [id number]  MEDICAL RECORD NO.:  [id number]  LOCATION:  F03C                         FACILITY:  [facility]  PHYSICIAN:  Lanelle Bal, MD    DATE OF BIRTH:  08/02/1940  DATE OF PROCEDURE:  04/17/2017 DATE OF DISCHARGE:                              OPERATIVE REPORT   PREOPERATIVE DIAGNOSIS:  Severe 3-vessel coronary artery disease with high-grade left main obstruction and recent non-ST elevation myocardial infarction.  POSTOPERATIVE DIAGNOSIS:  Severe 3-vessel coronary artery disease with high-grade left main obstruction and recent non-ST elevation myocardial infarction.  SURGICAL PROCEDURE:  Urgent coronary artery bypass grafting x4 with the left internal mammary to the distal left anterior descending coronary artery, reverse saphenous vein graft to the diagonal coronary artery, reverse saphenous vein graft to the circumflex coronary artery, reverse saphenous vein graft to the mid posterior descending coronary artery with right thigh and calf endo vein harvesting.  SURGEON:  Lanelle Bal, MD.  FIRST ASSISTANT:  Nicholes Rough, [place].  BRIEF HISTORY:  The patient is a 77 year old male with known coronary occlusive disease, having had a stent placed in his circumflex coronary artery 8 years previously, who presented in 2018 with symptoms of a stroke manifest by dizziness, this resolved.  He then presented 2 days prior to surgery with new onset of chest discomfort.  Troponins were elevated.  He underwent cardiac catheterization by Dr. Burt Knack, which showed significant distal left main disease, proximal LAD disease, restenoses of his circumflex stent, and high-grade mid right coronary lesions with diffuse disease throughout the distal right system.  The distal LAD was of poor quality; however, with the patient's overall ventricular function was preserved in spite of the patient's distal disease with his high-grade proximal LAD and left  main disease, coronary artery bypass grafting was recommended to the patient who agreed and signed informed consent.  DESCRIPTION OF PROCEDURE:  With Swan-Ganz and arterial line monitors in place, the patient underwent general endotracheal anesthesia without incident.  Skin of the chest and legs was prepped with Betadine and draped in usual sterile manner using the Guidant endo vein harvesting. Appropriate time-out was performed.  Using the Guidant endo vein harvesting system, right greater saphenous vein was harvested endoscopically from the right thigh and calf.  The vein was of good quality and caliber.  Median sternotomy was performed.  Left internal mammary artery was dissected down as a pedicle graft.  The distal artery was divided had good free flow.  The pericardium was opened.  Overall ventricular function appeared preserved.  Although an echocardiogram done the year previously suggested aortic dilatation, the aortic size was normal and by TEE, the maximum diameter of the aortic root was 3.6 cm with mild-to-trace aortic insufficiency.  The patient was systemically heparinized.  The ascending aorta was cannulated.  The right atrium was cannulated.  Aortic root vent cardioplegia needle was introduced into the ascending aorta.  The patient was placed on cardiopulmonary bypass 2.4 L/min/m2.  Sites of anastomosis were selected and dissected out of the epicardium.  The patient's body temperature was cooled to 32 degrees.  Aortic crossclamp was applied.  600 mL of cold blood potassium cardioplegia was administered with diastolic arrest  of the heart.  Myocardial septal temperature was monitored throughout the cross-clamp.  Attention was turned first to the distal right coronary artery and posterior descending coronary artery.  The distal right coronary artery was severely diseased and calcified as was the proximal posterior descending.  The posterior descending vessel was in the  mid portion, was opened and admitted a 1 mm probe proximally and distally. Using a running 7-0 Prolene, a segment of reverse saphenous vein graft was anastomosed to the mid posterior descending.  Additional cold blood cardioplegia was administered down the vein graft.  Attention was then turned to the circumflex coronary artery.  Major portion of the distal circumflex was intramyocardial, distal to the previously placed stent. The vessel was identified, opened, admitted a 1.5 mm probe distally. Using a running 7-0 Prolene, distal anastomosis was performed with a segment of reverse saphenous vein graft.  Attention was then turned to the LAD and diagonal.  The major portion of the LAD was intramyocardial, was identified at the distal third of the vessel.  There was a small diagonal branch identified.  This was totally occluded on the patient's cine film, only showed very faintly on delayed images.  With the poor quality of the LAD and the diagonal branch, we decided to bypass both of these.  The distal LAD was opened, admitted a 1 mm probe distally. Using a running 8-0 Prolene left internal mammary artery was anastomosed to the left anterior descending coronary artery.  A segment of reverse saphenous vein graft was anastomosed to the diagonal coronary artery with a running 7-0 Prolene.  The diagonal coronary artery was approximately 1 mm to 1.2 mm.  With crossclamp still in place, 3 punch aortotomies were performed and each of the 3 vein grafts were anastomosed to the ascending aorta.  The bulldog was removed from the mammary artery with prompt rise in myocardial septal temperature.  The heart was allowed to passively fill and de-air.  The proximal anastomoses were completed and aortic crossclamp was then removed.  The patient spontaneously converted to a sinus rhythm.  Sites of anastomosis were inspected, were free of bleeding.  Atrial and ventricular pacing wires were applied with the  patient's body temperature rewarmed to 37 degrees.  He was then ventilated and weaned from cardiopulmonary bypass without difficulty.  He remained hemodynamically stable.  He was decannulated in usual fashion.  Protamine sulfate was administered with operative field hemostatic.  A left pleural tube and a Blake mediastinal drain were left in place.  Pericardium was loosely reapproximated. Sternum was closed with #6 stainless steel wire.  Fascia was closed with interrupted 0 Vicryl, running 3-0 Vicryl in subcutaneous tissue, 4-0 subcuticular stitch in skin edges.  Dry dressings were applied.  Sponge and needle count was reported as correct at completion of procedure. Total cross-clamp time was 104 minutes.  Total pump time 133 minutes. The patient did not require any blood bank blood products during the operative procedure.     Lanelle Bal, MD     EG/MEDQ  D:  04/18/2017  T:  04/18/2017  Job:  [id number]

## 2017-04-18 NOTE — Progress Notes (Signed)
Patient ID: [name], male   DOB: November 15, 1940, 77 y.o.   MRN: [id number] TCTS DAILY ICU PROGRESS NOTE                   [address]            [place] [address]          [phone]   1 Day Post-Op Procedure(s) (LRB): CORONARY ARTERY BYPASS GRAFTING (CABG) x4 , using left internal mammary artery  to LAD and right leg greater saphenous vein harvested endoscopically  to PDA, Diagonal I and Circumflex (N/A) TRANSESOPHAGEAL ECHOCARDIOGRAM (TEE) (N/A)  Total Length of Stay:  LOS: 3 days   Subjective: Patient awake  intact neurologically   Objective: Vital signs in last 24 hours: Temp:  [95.4 F (35.2 C)-100 F (37.8 C)] 98.8 F (37.1 C) (02/28 0700) Pulse Rate:  [80-86] 80 (02/27 1837) Cardiac Rhythm: Atrial paced (02/28 0400) Resp:  [10-30] 23 (02/28 0700) BP: (78-134)/(56-85) 125/71 (02/28 0700) SpO2:  [89 %-100 %] 91 % (02/28 0700) Arterial Line BP: (88-166)/(44-82) 143/60 (02/28 0700) FiO2 (%):  [40 %-100 %] 40 % (02/27 2000) Weight:  [202 lb 13.2 oz (92 kg)] 202 lb 13.2 oz (92 kg) (02/28 0423)  Filed Weights   04/16/17 0653 04/17/17 0458 04/18/17 0423  Weight: 195 lb 8 oz (88.7 kg) 189 lb 9.5 oz (86 kg) 202 lb 13.2 oz (92 kg)    Weight change: 13 lb 3.6 oz (6 kg)   Hemodynamic parameters for last 24 hours: PAP: (14-40)/(1-18) 30/13 CO:  [4.2 L/min-5.8 L/min] 5.8 L/min CI:  [2.5 L/min/m2-3 L/min/m2] 3 L/min/m2  Intake/Output from previous day: 02/27 0701 - 02/28 0700 In: 4620 [I.V.:3320; Blood:400; IV Piggyback:900] Out: 3320 [Urine:2595; Blood:500; Chest Tube:225]  Intake/Output this shift: No intake/output data recorded.  Current Meds: Scheduled Meds: . acetaminophen  1,000 mg Oral Q6H   Or  . acetaminophen (TYLENOL) oral liquid 160 mg/5 mL  1,000 mg Per Tube Q6H  . aspirin EC  325 mg Oral Daily   Or  . aspirin  324 mg Per Tube Daily  . bisacodyl  10 mg Oral Daily   Or  . bisacodyl  10 mg Rectal Daily  . Chlorhexidine Gluconate  Cloth  6 each Topical Daily  . docusate sodium  200 mg Oral Daily  . insulin aspart  0-24 Units Subcutaneous Q4H  . mouth rinse  15 mL Mouth Rinse BID  . metoprolol tartrate  12.5 mg Oral BID   Or  . metoprolol tartrate  12.5 mg Per Tube BID  . [START ON 04/19/2017] pantoprazole  40 mg Oral Daily  . sodium chloride flush  10-40 mL Intracatheter Q12H  . sodium chloride flush  3 mL Intravenous Q12H   Continuous Infusions: . sodium chloride 20 mL/hr at 04/18/17 0700  . sodium chloride 250 mL (04/18/17 0700)  . sodium chloride 10 mL/hr at 04/18/17 0700  . albumin human    . cefUROXime (ZINACEF)  IV Stopped (04/17/17 2328)  . dexmedetomidine (PRECEDEX) IV infusion Stopped (04/17/17 1554)  . DOPamine 3 mcg/kg/min (04/18/17 0700)  . famotidine (PEPCID) IV Stopped (04/17/17 1423)  . lactated ringers    . lactated ringers Stopped (04/17/17 1330)  . lactated ringers 20 mL/hr at 04/18/17 0700  . milrinone 0.25 mcg/kg/min (04/18/17 0700)  . nitroGLYCERIN Stopped (04/17/17 1524)  . phenylephrine (NEO-SYNEPHRINE) Adult infusion Stopped (04/17/17 2200)   PRN Meds:.sodium chloride, albumin human, lactated ringers, metoprolol tartrate, midazolam, morphine injection,  ondansetron (ZOFRAN) IV, oxyCODONE, sodium chloride flush, sodium chloride flush, traMADol  General appearance: alert, cooperative and no distress Neurologic: intact Heart: regular rate and rhythm, S1, S2 normal, no murmur, click, rub or gallop Lungs: diminished breath sounds bibasilar Abdomen: soft, non-tender; bowel sounds normal; no masses,  no organomegaly Extremities: extremities normal, atraumatic, no cyanosis or edema and Homans sign is negative, no sign of DVT Wound: Sternal dressing intact  Lab Results: CBC: Recent Labs    04/17/17 1921 04/17/17 1936 04/18/17 0324  WBC 15.0*  --  14.0*  HGB 11.4* 10.9* 10.8*  HCT 33.2* 32.0* 31.0*  PLT 136*  --  118*   BMET:  Recent Labs    04/17/17 0315  04/17/17 1936  04/18/17 0324  NA 136   < > 141 138  K 3.4*   < > 3.8 3.8  CL 104   < > 106 108  CO2 23  --   --  23  GLUCOSE 136*   < > 159* 131*  BUN 8   < > 9 10  CREATININE 0.89   < > 0.70 0.82  CALCIUM 8.9  --   --  8.4*   < > = values in this interval not displayed.    CMET: Lab Results  Component Value Date   WBC 14.0 (H) 04/18/2017   HGB 10.8 (L) 04/18/2017   HCT 31.0 (L) 04/18/2017   PLT 118 (L) 04/18/2017   GLUCOSE 131 (H) 04/18/2017   CHOL 174 04/15/2017   TRIG 46 04/15/2017   HDL 39 (L) 04/15/2017   LDLDIRECT 150.8 06/30/2008   LDLCALC 126 (H) 04/15/2017   ALT 17 04/15/2017   AST 31 04/15/2017   NA 138 04/18/2017   K 3.8 04/18/2017   CL 108 04/18/2017   CREATININE 0.82 04/18/2017   BUN 10 04/18/2017   CO2 23 04/18/2017   TSH 4.888 (H) 04/17/2012   PSA 1.46 05/31/2014   INR 1.55 04/17/2017   HGBA1C 5.9 (H) 04/15/2017      PT/INR:  Recent Labs    04/17/17 1335  LABPROT 18.5*  INR 1.55   Radiology: Dg Chest Port 1 View  Result Date: 04/17/2017 CLINICAL DATA:  Status post CABG EXAM: PORTABLE CHEST 1 VIEW COMPARISON:  04/16/2017 FINDINGS: Interval CABG.  Stable cardiomediastinal silhouette. Endotracheal tube with the tip 4 cm above the carina. Nasogastric tube coursing below the diaphragm. Swan-Ganz catheter with the tip projecting over the right ventricular outflow tract. Left-sided chest tube. No pneumothorax. Small left pleural effusion. Right lung is clear. No acute osseous abnormality. IMPRESSION: 1. Interval CABG. 2. Support lines and tubing in satisfactory position as described above. Electronically Signed   By: Kathreen Devoid   On: 04/17/2017 14:43     Assessment/Plan: S/P Procedure(s) (LRB): CORONARY ARTERY BYPASS GRAFTING (CABG) x4 , using left internal mammary artery  to LAD and right leg greater saphenous vein harvested endoscopically  to PDA, Diagonal I and Circumflex (N/A) TRANSESOPHAGEAL ECHOCARDIOGRAM (TEE) (N/A) Mobilize Diuresis Diabetes control d/c  tubes/lines See progression orders Expected Acute  Blood - loss Anemia- continue to monitor  Patient has intolerance to statins Preop med list included Plavix, patient notes that he had not been taking Plavix for months, thinks he stopped it for colonoscopy Will resume when medically safe Consult cardiology for alternative lipid therapy  [name] 04/18/2017 7:30 AM

## 2017-04-18 NOTE — Progress Notes (Signed)
Patient ID: [name], male   DOB: Jul 06, 1940, 77 y.o.   MRN: [id number] EVENING ROUNDS NOTE :     [address]       [place] [address]             [phone]                 1 Day Post-Op Procedure(s) (LRB): CORONARY ARTERY BYPASS GRAFTING (CABG) x4 , using left internal mammary artery  to LAD and right leg greater saphenous vein harvested endoscopically  to PDA, Diagonal I and Circumflex (N/A) TRANSESOPHAGEAL ECHOCARDIOGRAM (TEE) (N/A)  Total Length of Stay:  LOS: 3 days  BP (!) 153/80   Pulse 80   Temp 99.1 F (37.3 C) (Oral)   Resp (!) 29   Ht 5\' 6"  (1.676 m)   Wt 202 lb 13.2 oz (92 kg)   SpO2 93%   BMI 32.74 kg/m   .Intake/Output      02/27 0701 - 02/28 0700 02/28 0701 - 03/01 0700   P.O.  240   I.V. (mL/kg) 3320 (36.1) 328.9 (3.6)   Blood 400    IV Piggyback 900 50   Total Intake(mL/kg) 4620 (50.2) 618.9 (6.7)   Urine (mL/kg/hr) 2595 (1.2) 490 (0.5)   Stool     Blood 500    Chest Tube 225 10   Total Output 3320 500   Net +1300 +118.9          . sodium chloride Stopped (04/18/17 0900)  . sodium chloride Stopped (04/18/17 0900)  . sodium chloride Stopped (04/18/17 0900)  . cefUROXime (ZINACEF)  IV Stopped (04/18/17 0946)  . lactated ringers    . lactated ringers Stopped (04/17/17 1330)  . lactated ringers 20 mL/hr at 04/18/17 1800  . milrinone 0.25 mcg/kg/min (04/18/17 1800)  . nitroGLYCERIN Stopped (04/17/17 1524)  . phenylephrine (NEO-SYNEPHRINE) Adult infusion Stopped (04/17/17 2200)  . potassium chloride Stopped (04/18/17 1723)     Lab Results  Component Value Date   WBC 17.7 (H) 04/18/2017   HGB 10.5 (L) 04/18/2017   HCT 31.0 (L) 04/18/2017   PLT 125 (L) 04/18/2017   GLUCOSE 210 (H) 04/18/2017   CHOL 174 04/15/2017   TRIG 46 04/15/2017   HDL 39 (L) 04/15/2017   LDLDIRECT 150.8 06/30/2008   LDLCALC 126 (H) 04/15/2017   ALT 17 04/15/2017   AST 31 04/15/2017   NA 140 04/18/2017   K 3.5 04/18/2017   CL 103 04/18/2017    CREATININE 0.80 04/18/2017   BUN 14 04/18/2017   CO2 23 04/18/2017   TSH 4.888 (H) 04/17/2012   PSA 1.46 05/31/2014   INR 1.55 04/17/2017   HGBA1C 5.9 (H) 04/15/2017   Stable off dopamine Patient thought dopamine bottle was pain meds  Diuresis tonight Sinus now Cr stable    [name] MD  Beeper [phone] Office [phone] 04/18/2017 6:37 PM

## 2017-04-19 ENCOUNTER — Inpatient Hospital Stay (HOSPITAL_COMMUNITY): Payer: Medicare HMO

## 2017-04-19 DIAGNOSIS — Z951 Presence of aortocoronary bypass graft: Secondary | ICD-10-CM

## 2017-04-19 LAB — TYPE AND SCREEN
ABO/RH(D): A POS
Antibody Screen: NEGATIVE
Unit division: 0
Unit division: 0

## 2017-04-19 LAB — BPAM RBC
Blood Product Expiration Date: 201903172359
Blood Product Expiration Date: 201903182359
ISSUE DATE / TIME: 201902270948
ISSUE DATE / TIME: 201902270948
Unit Type and Rh: 6200
Unit Type and Rh: 6200

## 2017-04-19 LAB — CBC
HCT: 31.5 % — ABNORMAL LOW (ref 39.0–52.0)
Hemoglobin: 10.5 g/dL — ABNORMAL LOW (ref 13.0–17.0)
MCH: 30.4 pg (ref 26.0–34.0)
MCHC: 33.3 g/dL (ref 30.0–36.0)
MCV: 91.3 fL (ref 78.0–100.0)
Platelets: 131 10*3/uL — ABNORMAL LOW (ref 150–400)
RBC: 3.45 MIL/uL — ABNORMAL LOW (ref 4.22–5.81)
RDW: 13.8 % (ref 11.5–15.5)
WBC: 18.5 10*3/uL — ABNORMAL HIGH (ref 4.0–10.5)

## 2017-04-19 LAB — BASIC METABOLIC PANEL
Anion gap: 9 (ref 5–15)
BUN: 13 mg/dL (ref 6–20)
CO2: 25 mmol/L (ref 22–32)
Calcium: 8.3 mg/dL — ABNORMAL LOW (ref 8.9–10.3)
Chloride: 103 mmol/L (ref 101–111)
Creatinine, Ser: 0.94 mg/dL (ref 0.61–1.24)
GFR calc Af Amer: >60 mL/min (ref >60)
GFR calc non Af Amer: >60 mL/min (ref >60)
Glucose, Bld: 161 mg/dL — ABNORMAL HIGH (ref 65–99)
Potassium: 3.6 mmol/L (ref 3.5–5.1)
Sodium: 137 mmol/L (ref 135–145)

## 2017-04-19 LAB — GLUCOSE, CAPILLARY
GLUCOSE-CAPILLARY: 151 mg/dL — AB (ref 65–99)
GLUCOSE-CAPILLARY: 151 mg/dL — AB (ref 65–99)
GLUCOSE-CAPILLARY: 166 mg/dL — AB (ref 65–99)
Glucose-Capillary: 153 mg/dL — ABNORMAL HIGH (ref 65–99)
Glucose-Capillary: 147 mg/dL — ABNORMAL HIGH (ref 65–99)

## 2017-04-19 LAB — COOXEMETRY PANEL
Carboxyhemoglobin: 1.1 % (ref 0.5–1.5)
Methemoglobin: 1.3 % (ref 0.0–1.5)
O2 Saturation: 82.9 %
Total hemoglobin: 10.7 g/dL — ABNORMAL LOW (ref 12.0–16.0)

## 2017-04-19 MED ORDER — ACETAMINOPHEN 325 MG PO TABS
650.0000 mg | ORAL_TABLET | Freq: Four times a day (QID) | ORAL | Status: DC | PRN
  Administered 2017-04-19 – 2017-04-23 (×5): 650 mg via ORAL
  Filled 2017-04-19 (×5): qty 2

## 2017-04-19 MED ORDER — SODIUM CHLORIDE 0.9% FLUSH
3.0000 mL | Freq: Two times a day (BID) | INTRAVENOUS | Status: DC
  Administered 2017-04-19 – 2017-04-22 (×7): 3 mL via INTRAVENOUS

## 2017-04-19 MED ORDER — CARVEDILOL 12.5 MG PO TABS
12.5000 mg | ORAL_TABLET | Freq: Two times a day (BID) | ORAL | Status: DC
  Administered 2017-04-19 – 2017-04-24 (×11): 12.5 mg via ORAL
  Filled 2017-04-19 (×10): qty 1

## 2017-04-19 MED ORDER — TRAMADOL HCL 50 MG PO TABS
50.0000 mg | ORAL_TABLET | ORAL | Status: DC | PRN
  Administered 2017-04-23 (×2): 100 mg via ORAL
  Filled 2017-04-19 (×2): qty 2

## 2017-04-19 MED ORDER — INSULIN ASPART 100 UNIT/ML ~~LOC~~ SOLN
0.0000 [IU] | Freq: Three times a day (TID) | SUBCUTANEOUS | Status: DC
  Administered 2017-04-19: 4 [IU] via SUBCUTANEOUS
  Administered 2017-04-19 – 2017-04-20 (×3): 2 [IU] via SUBCUTANEOUS
  Administered 2017-04-20: 4 [IU] via SUBCUTANEOUS
  Administered 2017-04-20 – 2017-04-21 (×3): 2 [IU] via SUBCUTANEOUS
  Administered 2017-04-21: 4 [IU] via SUBCUTANEOUS
  Administered 2017-04-21: 2 [IU] via SUBCUTANEOUS
  Administered 2017-04-21: 4 [IU] via SUBCUTANEOUS
  Administered 2017-04-22: 2 [IU] via SUBCUTANEOUS

## 2017-04-19 MED ORDER — POTASSIUM CHLORIDE CRYS ER 20 MEQ PO TBCR
20.0000 meq | EXTENDED_RELEASE_TABLET | Freq: Every day | ORAL | Status: DC
  Administered 2017-04-19 – 2017-04-24 (×6): 20 meq via ORAL
  Filled 2017-04-19 (×7): qty 1

## 2017-04-19 MED ORDER — BISACODYL 10 MG RE SUPP: 10.0000 mg | Freq: Every day | RECTAL | Status: DC | PRN

## 2017-04-19 MED ORDER — SODIUM CHLORIDE 0.9 % IV SOLN: 250.0000 mL | INTRAVENOUS | Status: DC | PRN

## 2017-04-19 MED ORDER — MOVING RIGHT ALONG BOOK
Freq: Once | Status: AC
  Administered 2017-04-19: 09:00:00
  Filled 2017-04-19: qty 1

## 2017-04-19 MED ORDER — PANTOPRAZOLE SODIUM 40 MG PO TBEC
40.0000 mg | DELAYED_RELEASE_TABLET | Freq: Every day | ORAL | Status: DC
  Administered 2017-04-19 – 2017-04-24 (×6): 40 mg via ORAL
  Filled 2017-04-19 (×6): qty 1

## 2017-04-19 MED ORDER — ENOXAPARIN SODIUM 40 MG/0.4ML ~~LOC~~ SOLN
40.0000 mg | Freq: Every day | SUBCUTANEOUS | Status: DC
  Administered 2017-04-19 – 2017-04-23 (×5): 40 mg via SUBCUTANEOUS
  Filled 2017-04-19 (×5): qty 0.4

## 2017-04-19 MED ORDER — DOCUSATE SODIUM 100 MG PO CAPS
200.0000 mg | ORAL_CAPSULE | Freq: Every day | ORAL | Status: DC
  Administered 2017-04-19 – 2017-04-24 (×3): 200 mg via ORAL
  Filled 2017-04-19 (×5): qty 2

## 2017-04-19 MED ORDER — ROSUVASTATIN CALCIUM 10 MG PO TABS
20.0000 mg | ORAL_TABLET | Freq: Every day | ORAL | Status: DC
  Administered 2017-04-19 – 2017-04-23 (×5): 20 mg via ORAL
  Filled 2017-04-19 (×2): qty 2
  Filled 2017-04-19: qty 1
  Filled 2017-04-19: qty 2

## 2017-04-19 MED ORDER — OXYCODONE HCL 5 MG PO TABS
5.0000 mg | ORAL_TABLET | ORAL | Status: DC | PRN
  Administered 2017-04-19: 5 mg via ORAL
  Administered 2017-04-20 – 2017-04-21 (×5): 10 mg via ORAL
  Filled 2017-04-19 (×5): qty 2
  Filled 2017-04-19: qty 1

## 2017-04-19 MED ORDER — BISACODYL 5 MG PO TBEC
10.0000 mg | DELAYED_RELEASE_TABLET | Freq: Every day | ORAL | Status: DC | PRN
  Administered 2017-04-20: 10 mg via ORAL
  Filled 2017-04-19: qty 2

## 2017-04-19 MED ORDER — ONDANSETRON HCL 4 MG/2ML IJ SOLN: 4.0000 mg | Freq: Four times a day (QID) | INTRAMUSCULAR | Status: DC | PRN

## 2017-04-19 MED ORDER — ONDANSETRON HCL 4 MG PO TABS: 4.0000 mg | ORAL_TABLET | Freq: Four times a day (QID) | ORAL | Status: DC | PRN

## 2017-04-19 MED ORDER — ASPIRIN EC 81 MG PO TBEC
81.0000 mg | DELAYED_RELEASE_TABLET | Freq: Every day | ORAL | Status: DC
  Administered 2017-04-19 – 2017-04-24 (×6): 81 mg via ORAL
  Filled 2017-04-19 (×6): qty 1

## 2017-04-19 MED ORDER — FUROSEMIDE 40 MG PO TABS
40.0000 mg | ORAL_TABLET | Freq: Every day | ORAL | Status: AC
  Administered 2017-04-19 – 2017-04-21 (×3): 40 mg via ORAL
  Filled 2017-04-19 (×3): qty 1

## 2017-04-19 MED ORDER — SODIUM CHLORIDE 0.9% FLUSH: 3.0000 mL | INTRAVENOUS | Status: DC | PRN

## 2017-04-19 MED ORDER — GUAIFENESIN ER 600 MG PO TB12
600.0000 mg | ORAL_TABLET | Freq: Two times a day (BID) | ORAL | Status: DC | PRN
  Administered 2017-04-23: 600 mg via ORAL
  Filled 2017-04-19: qty 1

## 2017-04-19 MED ORDER — FUROSEMIDE 10 MG/ML IJ SOLN: 40.0000 mg | Freq: Once | INTRAMUSCULAR | Status: DC

## 2017-04-19 NOTE — Progress Notes (Signed)
      [address]       [place], [address]             [phone]    POD # 2 CABG  Resting comfortably  BP (!) 145/94   Pulse 97   Temp 99.7 F (37.6 C) (Oral)   Resp (!) 35   Ht 5\' 6"  (1.676 m)   Wt 201 lb 8 oz (91.4 kg)   SpO2 93%   BMI 32.52 kg/m    Intake/Output Summary (Last 24 hours) at 04/19/2017 1809 Last data filed at 04/19/2017 1400 Gross per 24 hour  Intake 663.9 ml  Output 1755 ml  Net -1091.1 ml   Awaiting transfer to [place]. Roxan Hockey, MD [facility] [phone]

## 2017-04-19 NOTE — Progress Notes (Signed)
Patient arrived from [facility] to 4E room 9 after CABG x4 on 2/27.  Telemetry monitor applied and CCMD notified.  Patient oriented to unit and room to include call light and phone.  Will continue to monitor.

## 2017-04-19 NOTE — Discharge Instructions (Signed)

## 2017-04-19 NOTE — Progress Notes (Signed)
Progress Note  Patient Name: [name] Date of Encounter: 04/19/2017  Primary Cardiologist: No primary care provider on file.   Subjective   Pain well controlled.  He does not recall a problem while taking statins in the past.  Inpatient Medications    Scheduled Meds: . aspirin EC  81 mg Oral Daily  . carvedilol  12.5 mg Oral BID WC  . Chlorhexidine Gluconate Cloth  6 each Topical Daily  . clopidogrel  75 mg Oral Daily  . docusate sodium  200 mg Oral Daily  . enoxaparin (LOVENOX) injection  40 mg Subcutaneous QHS  . furosemide  40 mg Oral Daily  . insulin aspart  0-24 Units Subcutaneous TID AC & HS  . mouth rinse  15 mL Mouth Rinse BID  . pantoprazole  40 mg Oral QAC breakfast  . potassium chloride  20 mEq Oral Daily  . sodium chloride flush  10-40 mL Intracatheter Q12H  . sodium chloride flush  3 mL Intravenous Q12H   Continuous Infusions: . sodium chloride     PRN Meds: sodium chloride, acetaminophen, bisacodyl **OR** bisacodyl, guaiFENesin, ondansetron **OR** ondansetron (ZOFRAN) IV, oxyCODONE, sodium chloride flush, sodium chloride flush, traMADol   Vital Signs    Vitals:   04/19/17 0800 04/19/17 0836 04/19/17 0900 04/19/17 1000  BP: (!) 155/93 (!) 155/93 (!) 147/77 123/73  Pulse:  95    Resp: (!) 22  (!) 30 16  Temp: 98.4 F (36.9 C)     TempSrc: Oral     SpO2: 95%  95% 93%  Weight:      Height:        Intake/Output Summary (Last 24 hours) at 04/19/2017 1129 Last data filed at 04/19/2017 0800 Gross per 24 hour  Intake 1133 ml  Output 1875 ml  Net -742 ml   Filed Weights   04/17/17 0458 04/18/17 0423 04/19/17 0600  Weight: 189 lb 9.5 oz (86 kg) 202 lb 13.2 oz (92 kg) 201 lb 8 oz (91.4 kg)    Telemetry    NSR - Personally Reviewed  ECG      Physical Exam   GEN: No acute distress.   Neck: No JVD Cardiac: RRR, no murmurs, rubs, or gallops.  Respiratory: Clear to auscultation bilaterally. GI: Soft, nontender, non-distended  MS: No  edema; No deformity. Neuro:  Nonfocal  Psych: Normal affect   Labs    Chemistry Recent Labs  Lab 04/15/17 0604 04/17/17 0315  04/18/17 0324 04/18/17 1538 04/18/17 1545 04/19/17 0400  NA 137 136   < > 138  --  140 137  K 3.5 3.4*   < > 3.8  --  3.5 3.6  CL 103 104   < > 108  --  103 103  CO2 24 23  --  23  --   --  25  GLUCOSE 125* 136*   < > 131*  --  210* 161*  BUN 13 8   < > 10  --  14 13  CREATININE 0.85 0.89   < > 0.82 1.14 0.80 0.94  CALCIUM 8.6* 8.9  --  8.4*  --   --  8.3*  PROT 6.1*  --   --   --   --   --   --   ALBUMIN 3.2*  --   --   --   --   --   --   AST 31  --   --   --   --   --   --  ALT 17  --   --   --   --   --   --   ALKPHOS 55  --   --   --   --   --   --   BILITOT 1.0  --   --   --   --   --   --   GFRNONAA >60 >60   < > >60 >60  --  >60  GFRAA >60 >60   < > >60 >60  --  >60  ANIONGAP 10 9  --  7  --   --  9   < > = values in this interval not displayed.     Hematology Recent Labs  Lab 04/18/17 0324 04/18/17 1538 04/18/17 1545 04/19/17 0400  WBC 14.0* 17.7*  --  18.5*  RBC 3.43* 3.44*  --  3.45*  HGB 10.8* 10.7* 10.5* 10.5*  HCT 31.0* 31.5* 31.0* 31.5*  MCV 90.4 91.6  --  91.3  MCH 31.5 31.1  --  30.4  MCHC 34.8 34.0  --  33.3  RDW 13.7 14.0  --  13.8  PLT 118* 125*  --  131*    Cardiac Enzymes Recent Labs  Lab 04/15/17 0036 04/15/17 0604 04/15/17 1320  TROPONINI 0.14* 0.13* 0.10*    Recent Labs  Lab 04/14/17 2201  TROPIPOC 0.06     BNPNo results for input(s): BNP, PROBNP in the last 168 hours.   DDimer No results for input(s): DDIMER in the last 168 hours.   Radiology    Dg Chest Port 1 View  Result Date: 04/19/2017 CLINICAL DATA:  Chest tube placement EXAM: PORTABLE CHEST 1 VIEW COMPARISON:  Yesterday FINDINGS: Swan-Ganz catheter has been removed from the stably located right IJ sheath. Left chest tube is been removed. No pneumothorax. Stable low volumes with small left effusion. Stable heart size after CABG.  IMPRESSION: 1. Removed chest tube with no visible pneumothorax. 2. Stable low volumes with small effusion on the left. Electronically Signed   By: Monte Fantasia M.D.   On: 04/19/2017 07:47   Dg Chest Port 1 View  Result Date: 04/18/2017 CLINICAL DATA:  Routine morning portable, left heart cath 04/16/17, hx of CAD EXAM: PORTABLE CHEST 1 VIEW COMPARISON:  04/17/2017 FINDINGS: Interval extubation. No increase in atelectasis. Removal of NG tube. LEFT chest tube in place without pneumothorax. Swan-Ganz catheter with tip in the main pulmonary artery. Persistent mild LEFT basilar atelectasis. No pulmonary edema. IMPRESSION: Extubation without complication. Electronically Signed   By: Suzy Bouchard M.D.   On: 04/18/2017 08:09   Dg Chest Port 1 View  Result Date: 04/17/2017 CLINICAL DATA:  Status post CABG EXAM: PORTABLE CHEST 1 VIEW COMPARISON:  04/16/2017 FINDINGS: Interval CABG.  Stable cardiomediastinal silhouette. Endotracheal tube with the tip 4 cm above the carina. Nasogastric tube coursing below the diaphragm. Swan-Ganz catheter with the tip projecting over the right ventricular outflow tract. Left-sided chest tube. No pneumothorax. Small left pleural effusion. Right lung is clear. No acute osseous abnormality. IMPRESSION: 1. Interval CABG. 2. Support lines and tubing in satisfactory position as described above. Electronically Signed   By: Kathreen Devoid   On: 04/17/2017 14:43    Cardiac Studies     Patient Profile     77 y.o. male with CAD, hyperlipidemia  Assessment & Plan    1) CAD: s/p CABG. Agree with restarting Plavix,especially given h/o stroke.  2) Hyperlipidemia:  He was on Crestor 20 mg daily at the time  of his last cardiology office visit with Dr. Burt Knack.  He was instructed to continue this medicine.  I do not think there is any true allergy to statins.  Will follow lipids and LFTs.  3) He was on heparin and had no issues.  Wife states that he had a rash when eating pork in the  past.   For questions or updates, please contact [place] Please consult [email or web address] for contact info under Cardiology/STEMI.      Signed, Larae Grooms, MD  04/19/2017, 11:29 AM

## 2017-04-19 NOTE — Progress Notes (Addendum)
Patient ID: [name], male   DOB: [birth year], 77 y.o.   MRN: [id number] TCTS DAILY ICU PROGRESS NOTE                   [address]            [place] [address]          [phone]   2 Days Post-Op Procedure(s) (LRB): CORONARY ARTERY BYPASS GRAFTING (CABG) x4 , using left internal mammary artery  to LAD and right leg greater saphenous vein harvested endoscopically  to PDA, Diagonal I and Circumflex (N/A) TRANSESOPHAGEAL ECHOCARDIOGRAM (TEE) (N/A)  Total Length of Stay:  LOS: 4 days   Subjective: Patient awake alert neurologically intact walked around the unit this morning  Objective: Vital signs in last 24 hours: Temp:  [98.3 F (36.8 C)-99.5 F (37.5 C)] 98.3 F (36.8 C) (03/01 0436) Cardiac Rhythm: Normal sinus rhythm (03/01 0400) Resp:  [15-30] 20 (03/01 0600) BP: (123-165)/(66-83) 158/77 (03/01 0600) SpO2:  [92 %-96 %] 94 % (03/01 0600) Arterial Line BP: (133-157)/(58-68) 157/68 (02/28 0900) Weight:  [201 lb 8 oz (91.4 kg)] 201 lb 8 oz (91.4 kg) (03/01 0600)  Filed Weights   04/17/17 0458 04/18/17 0423 04/19/17 0600  Weight: 189 lb 9.5 oz (86 kg) 202 lb 13.2 oz (92 kg) 201 lb 8 oz (91.4 kg)    Weight change: -5.2 oz (-0.6 kg)   Hemodynamic parameters for last 24 hours: PAP: (27-35)/(12-19) 35/19  Intake/Output from previous day: 02/28 0701 - 03/01 0700 In: 1223.4 [P.O.:340; I.V.:633.4; IV Piggyback:250] Out: 2020 [Urine:2010; Chest Tube:10]  Intake/Output this shift: No intake/output data recorded.  Current Meds: Scheduled Meds: . acetaminophen  1,000 mg Oral Q6H   Or  . acetaminophen (TYLENOL) oral liquid 160 mg/5 mL  1,000 mg Per Tube Q6H  . aspirin EC  325 mg Oral Daily   Or  . aspirin  324 mg Per Tube Daily  . bisacodyl  10 mg Oral Daily   Or  . bisacodyl  10 mg Rectal Daily  . Chlorhexidine Gluconate Cloth  6 each Topical Daily  . clopidogrel  75 mg Oral Daily  . docusate sodium  200 mg Oral Daily  . enoxaparin (LOVENOX)  injection  30 mg Subcutaneous QHS  . insulin aspart  0-24 Units Subcutaneous Q4H  . mouth rinse  15 mL Mouth Rinse BID  . metoprolol tartrate  12.5 mg Oral BID   Or  . metoprolol tartrate  12.5 mg Per Tube BID  . pantoprazole  40 mg Oral Daily  . sodium chloride flush  10-40 mL Intracatheter Q12H  . sodium chloride flush  3 mL Intravenous Q12H   Continuous Infusions: . sodium chloride Stopped (04/18/17 0900)  . sodium chloride Stopped (04/18/17 0900)  . sodium chloride Stopped (04/18/17 0900)  . cefUROXime (ZINACEF)  IV Stopped (04/18/17 2230)  . lactated ringers    . lactated ringers Stopped (04/17/17 1330)  . lactated ringers 20 mL/hr at 04/19/17 0600  . milrinone 0.25 mcg/kg/min (04/19/17 0600)  . nitroGLYCERIN Stopped (04/17/17 1524)  . phenylephrine (NEO-SYNEPHRINE) Adult infusion Stopped (04/17/17 2200)  . potassium chloride 10 mEq (04/19/17 0744)   PRN Meds:.sodium chloride, lactated ringers, metoprolol tartrate, midazolam, morphine injection, ondansetron (ZOFRAN) IV, oxyCODONE, potassium chloride, sodium chloride flush, sodium chloride flush, traMADol  General appearance: alert, cooperative and no distress Neurologic: intact Heart: regular rate and rhythm, S1, S2 normal, no murmur, click, rub or gallop Lungs: diminished breath sounds bibasilar  Abdomen: soft, non-tender; bowel sounds normal; no masses,  no organomegaly Extremities: extremities normal, atraumatic, no cyanosis or edema and Homans sign is negative, no sign of DVT Wound: Sternum stable dressing intact  Lab Results: CBC: Recent Labs    04/18/17 1538 04/18/17 1545 04/19/17 0400  WBC 17.7*  --  18.5*  HGB 10.7* 10.5* 10.5*  HCT 31.5* 31.0* 31.5*  PLT 125*  --  131*   BMET:  Recent Labs    04/18/17 0324  04/18/17 1545 04/19/17 0400  NA 138  --  140 137  K 3.8  --  3.5 3.6  CL 108  --  103 103  CO2 23  --   --  25  GLUCOSE 131*  --  210* 161*  BUN 10  --  14 13  CREATININE 0.82   < > 0.80 0.94    CALCIUM 8.4*  --   --  8.3*   < > = values in this interval not displayed.    CMET: Lab Results  Component Value Date   WBC 18.5 (H) 04/19/2017   HGB 10.5 (L) 04/19/2017   HCT 31.5 (L) 04/19/2017   PLT 131 (L) 04/19/2017   GLUCOSE 161 (H) 04/19/2017   CHOL 174 04/15/2017   TRIG 46 04/15/2017   HDL 39 (L) 04/15/2017   LDLDIRECT 150.8 06/30/2008   LDLCALC 126 (H) 04/15/2017   ALT 17 04/15/2017   AST 31 04/15/2017   NA 137 04/19/2017   K 3.6 04/19/2017   CL 103 04/19/2017   CREATININE 0.94 04/19/2017   BUN 13 04/19/2017   CO2 25 04/19/2017   TSH 4.888 (H) 04/17/2012   PSA 1.46 05/31/2014   INR 1.55 04/17/2017   HGBA1C 5.9 (H) 04/15/2017      PT/INR:  Recent Labs    04/17/17 1335  LABPROT 18.5*  INR 1.55   Radiology: Dg Chest Port 1 View  Result Date: 04/19/2017 CLINICAL DATA:  Chest tube placement EXAM: PORTABLE CHEST 1 VIEW COMPARISON:  Yesterday FINDINGS: Swan-Ganz catheter has been removed from the stably located right IJ sheath. Left chest tube is been removed. No pneumothorax. Stable low volumes with small left effusion. Stable heart size after CABG. IMPRESSION: 1. Removed chest tube with no visible pneumothorax. 2. Stable low volumes with small effusion on the left. Electronically Signed   By: [name] M.D.   On: 04/19/2017 07:47     Assessment/Plan: S/P Procedure(s) (LRB): CORONARY ARTERY BYPASS GRAFTING (CABG) x4 , using left internal mammary artery  to LAD and right leg greater saphenous vein harvested endoscopically  to PDA, Diagonal I and Circumflex (N/A) TRANSESOPHAGEAL ECHOCARDIOGRAM (TEE) (N/A) Mobilize Diuresis Plan for transfer to step-down: see transfer orders Cardiology requested yesterday to see patient concerning long-term lipid management Back on Plavix, was stopped for colonoscopy and never resumed  Discussed "pork allergy" with the patient and his wife-patient notes that years ago while living in [place] he developed a rash and was  told it was due to eating pork, he has no associated symptoms with eating lamb or beef.  Patient was started on heparin at the time of admission without allergic reaction.  [name] 04/19/2017 7:56 AM

## 2017-04-19 NOTE — Discharge Summary (Signed)
Physician Discharge Summary       [address]       [place] [address]             [phone]    Patient ID: [name] MRN: [id number] DOB/AGE: [birth year] 77 y.o.  Admit date: 04/14/2017 Discharge date: 04/24/2017  Admission Diagnoses: 1. Non-ST elevation (NSTEMI) myocardial infarction ([facility]) 2. History of coronary artery disease-s/p PCI with BMS to Circumflex 08'  Discharge Diagnoses:  1. S/P CABG x 4 2. ABL anemia 3. Mild thrombocytopenia  4. History of hypertension 5. History of hyperlipidemia 6. History of ischemic stroke ([facility]) 7. History of Polycythemia vera 8. History of vertigo  Procedure (s):  LEFT HEART CATH AND CORONARY ANGIOGRAPHY by Dr. Burt Knack on 04/16/2017:  Conclusion     The left ventricular systolic function is normal.  LV end diastolic pressure is mildly elevated.  The left ventricular ejection fraction is 55-65% by visual estimate.  Prox RCA lesion is 50% stenosed.  Mid RCA lesion is 95% stenosed.  Dist RCA lesion is 50% stenosed with 90% stenosed side branch in Ost RPDA.  Dist LM to Ost LAD lesion is 90% stenosed.  Ost 1st Diag lesion is 100% stenosed.  Prox LAD lesion is 85% stenosed.  Mid LAD lesion is 80% stenosed.  Ost 1st Mrg to 1st Mrg lesion is 80% stenosed.  Ost Cx lesion is 90% stenosed.  Prox Cx lesion is 50% stenosed.   1.  Critical left main and multivessel coronary artery disease with severe proximal LAD stenosis, severe ostial left circumflex stenosis and severe in-stent restenosis, and severe mid RCA stenosis 2.  Normal LV systolic function with mildly elevated LVEDP    Urgent coronary artery bypass grafting x4 with the left internal mammary to the distal left anterior descending coronary artery, reverse saphenous vein graft to the diagonal coronary artery, reverse saphenous vein graft to the circumflex coronary artery, reverse saphenous vein graft to the mid posterior descending coronary  artery with right thigh and calf endo vein harvesting by Dr. Servando Snare on 04/17/2017.   History of Presenting Illness: [name] is a 77 year old male patient with a significant past medical history for hypertension, hyperlipidemia, coronary artery disease status post BMS to the left circumflex and OM in 2008, cerebrovascular accident in 2018 who presented to [facility] with chest discomfort.  The discomfort began a few days ago and then progressed into chest pressure which was located substernally and radiated to the left arm.  There was associated dyspnea and diaphoresis.  He presented to [facility] on April 15, 2017.  He was started on IV nitroglycerin and with complete resolution of pain.  His troponin was minimally elevated.  He was started on IV heparin.  Initially his EKG showed sinus rhythm with nonspecific T wave changes in the inferior leads which was somewhat similar to prior EKGs.  The patient denies orthopnea, PND, syncope, and lower extremity edema.  Cardiac cath was performed today which showed an estimated ejection fraction of 55-65%, mid RCA 95% stenosis, distal left main into ostial LAD lesion of 90%, ostial first diagonal lesion of 100% stenosis, proximal LAD lesion of 85% stenosis, and ostial circumflex lesion of 90% stenosis.  Due to the severity of critical left main and multivessel disease we have been consulted for coronary artery bypass grafting. On heparin gtt and Nitro gtt and is pain free.   Dr. Servando Snare discussed the need for coronary artery bypass grafting surgery. Potential risks, benefits, and  complications of the surgery were discussed with the patient and he agreed to proceed with surgery. Pre operative carotid duplex US showed no significant internal carotid artery stenosis bilaterally. He underwent an urgent CABG x 4 on 04/17/2017.   Brief Hospital Course:  The patient was extubated the evening of surgery without difficulty. He remained afebrile and  hemodynamically stable. He was weaned off Dopamine drip.  Gordy Councilman, a line, chest tubes, and foley were removed early in the post operative course. Lopressor was started and titrated accordingly. He was volume over loaded and diuresed. He had ABL anemia. He did not require a post op transfusion. Last H and H was 10.8/31.9 . He was restarted on Plavix. He was weaned off the insulin drip.  The patient's glucose remained well controlled.The patient's HGA1C pre op was 5.7. He is likely pre diabetic and will require further surveillance of his HGA1C after discharge. Diet education has been included in his discharge paperwork. The patient was felt surgically stable for transfer from the ICU to PCTU for further convalescence on 04/19/2017. He continues to progress with cardiac rehab. He was ambulating on room air.  He developed a bad coughing spell.  He expectorated a lot of sputum.  He complained of shortness of breath.  CXR was obtained and showed stable small left pleural effusion/atelectasis.  He was treated with diuretics for mild volume overload status.  He has been tolerating a diet and has had a bowel movement. Epicardial pacing wires were removed on 04/22/2017.  His incisions were healing without evidence of infection. Chest tube sutures will be removed the day of discharge. The patient is felt surgically stable for discharge today.   Latest Vital Signs: Blood pressure (!) 147/68, pulse 95, temperature 97.9 F (36.6 C), temperature source Oral, resp. rate 20, height 5\' 6"  (1.676 m), weight 203 lb 3.2 oz (92.2 kg), SpO2 94 %.  Physical Exam: General appearance: alert, cooperative and no distress Neurologic: intact Heart: regular rate and rhythm, S1, S2 normal, no murmur, click, rub or gallop Lungs: diminished breath sounds bibasilar Abdomen: soft, non-tender; bowel sounds normal; no masses,  no organomegaly Extremities: extremities normal, atraumatic, no cyanosis or edema and Homans sign is negative,  no sign of DVT Wound: Sternum stable dressing intact    Discharge Condition: Stable and discharged to Home  Recent laboratory studies:  Lab Results  Component Value Date   WBC 13.1 (H) 04/23/2017   HGB 9.8 (L) 04/23/2017   HCT 28.4 (L) 04/23/2017   MCV 91.0 04/23/2017   PLT 267 04/23/2017   Lab Results  Component Value Date   NA 134 (L) 04/23/2017   K 4.1 04/23/2017   CL 99 (L) 04/23/2017   CO2 26 04/23/2017   CREATININE 1.04 04/23/2017   GLUCOSE 147 (H) 04/23/2017    Diagnostic Studies: Dg Chest 2 View  Result Date: 04/23/2017 CLINICAL DATA:  Dyspnea.  Dry cough following heart surgery. EXAM: CHEST  2 VIEW COMPARISON:  Two-view chest x-ray 04/20/2017 FINDINGS: Cardiomegaly is stable. Patient is status post median sternotomy for CABG. Aeration of both lungs continues to improve. Left greater than right pleural effusion and basilar airspace disease remain. Atherosclerotic changes are noted at the aortic arch. IMPRESSION: 1. Stable cardiomegaly with improving aeration of both lungs. 2. Persistent left pleural effusion and associated airspace disease. Electronically Signed   By: San Morelle M.D.   On: 04/23/2017 09:41   Dg Chest 2 View  Result Date: 04/20/2017 CLINICAL DATA:  77 year old male status  post CABG postop day 3. EXAM: CHEST  2 VIEW COMPARISON:  04/19/2017 and earlier. FINDINGS: AP and lateral views of the chest. Right IJ introducer sheath removed. Epicardial pacer wires remain. Continued low lung volumes. Small left versus bilateral pleural effusions. Streaky left lung base opacity most resembles atelectasis. Linear atelectasis at the right hilum. No pneumothorax or pulmonary edema. Stable cardiac size and mediastinal contours. Visualized tracheal air column is within normal limits. Visible bowel gas pattern within normal limits. IMPRESSION: 1. Right IJ introducer sheath removed. 2. No pneumothorax or pulmonary edema. 3. Low lung volumes with atelectasis, small left side  versus bilateral pleural effusion(s). Electronically Signed   By: Genevie Ann M.D.   On: 04/20/2017 10:09   Dg Chest 2 View  Result Date: 04/16/2017 CLINICAL DATA:  Preop CABG EXAM: CHEST  2 VIEW COMPARISON:  04/14/2017 FINDINGS: Heart and mediastinal contours are within normal limits. No focal opacities or effusions. No acute bony abnormality. IMPRESSION: No active cardiopulmonary disease. Electronically Signed   By: Rolm Baptise M.D.   On: 04/16/2017 20:25   Dg Chest 2 View  Result Date: 04/14/2017 CLINICAL DATA:  Chest pain EXAM: CHEST  2 VIEW COMPARISON:  04/06/2016 FINDINGS: Heart is borderline in size. No confluent opacities, effusions or edema. No acute bony abnormality. IMPRESSION: No active cardiopulmonary disease. Electronically Signed   By: Rolm Baptise M.D.   On: 04/14/2017 22:26   Dg Chest Port 1 View  Result Date: 04/19/2017 CLINICAL DATA:  Chest tube placement EXAM: PORTABLE CHEST 1 VIEW COMPARISON:  Yesterday FINDINGS: Swan-Ganz catheter has been removed from the stably located right IJ sheath. Left chest tube is been removed. No pneumothorax. Stable low volumes with small left effusion. Stable heart size after CABG. IMPRESSION: 1. Removed chest tube with no visible pneumothorax. 2. Stable low volumes with small effusion on the left. Electronically Signed   By: Monte Fantasia M.D.   On: 04/19/2017 07:47   Dg Chest Port 1 View  Result Date: 04/18/2017 CLINICAL DATA:  Routine morning portable, left heart cath 04/16/17, hx of CAD EXAM: PORTABLE CHEST 1 VIEW COMPARISON:  04/17/2017 FINDINGS: Interval extubation. No increase in atelectasis. Removal of NG tube. LEFT chest tube in place without pneumothorax. Swan-Ganz catheter with tip in the main pulmonary artery. Persistent mild LEFT basilar atelectasis. No pulmonary edema. IMPRESSION: Extubation without complication. Electronically Signed   By: Suzy Bouchard M.D.   On: 04/18/2017 08:09   Dg Chest Port 1 View  Result Date:  04/17/2017 CLINICAL DATA:  Status post CABG EXAM: PORTABLE CHEST 1 VIEW COMPARISON:  04/16/2017 FINDINGS: Interval CABG.  Stable cardiomediastinal silhouette. Endotracheal tube with the tip 4 cm above the carina. Nasogastric tube coursing below the diaphragm. Swan-Ganz catheter with the tip projecting over the right ventricular outflow tract. Left-sided chest tube. No pneumothorax. Small left pleural effusion. Right lung is clear. No acute osseous abnormality. IMPRESSION: 1. Interval CABG. 2. Support lines and tubing in satisfactory position as described above. Electronically Signed   By: Kathreen Devoid   On: 04/17/2017 14:43   Discharge Instructions    Amb Referral to Cardiac Rehabilitation   Complete by:  As directed    Diagnosis:  CABG   CABG X ___:  4      Discharge Medications: Allergies as of 04/24/2017      Reactions   Lipitor [atorvastatin] Shortness Of Breath   Pork-derived Products Other (See Comments)   No pork products - unspecified      Medication List  TAKE these medications   acetaminophen 325 MG tablet Commonly known as:  TYLENOL Take 2 tablets (650 mg total) by mouth every 6 (six) hours as needed for mild pain.   aspirin EC 81 MG tablet Take 81 mg by mouth daily.   benzonatate 100 MG capsule Commonly known as:  TESSALON Take 1 capsule (100 mg total) by mouth 3 (three) times daily as needed for cough.   carvedilol 12.5 MG tablet Commonly known as:  COREG Take 1 tablet (12.5 mg total) 2 (two) times daily by mouth.   clopidogrel 75 MG tablet Commonly known as:  PLAVIX Take 1 tablet (75 mg total) daily by mouth.   hydrochlorothiazide 25 MG tablet Commonly known as:  HYDRODIURIL Take 1 tablet (25 mg total) by mouth daily.   rosuvastatin 20 MG tablet Commonly known as:  CRESTOR Take 1 tablet (20 mg total) by mouth daily at 6 PM.   traMADol 50 MG tablet Commonly known as:  ULTRAM Take 1 tablet (50 mg total) by mouth every 4 (four) hours as needed for moderate  pain.   valsartan 320 MG tablet Commonly known as:  DIOVAN Take 1 tablet (320 mg total) by mouth daily.            Durable Medical Equipment  (From admission, onward)        Start     Ordered   04/23/17 1509  For home use only DME 3 n 1  Once     04/23/17 1508   04/23/17 1454  For home use only DME 4 wheeled rolling walker with seat  Once    Question:  Patient needs a walker to treat with the following condition  Answer:  S/P CABG x 4   04/23/17 1455    The patient has been discharged on:   1.Beta Blocker:  Yes [ x  ]                              No   [   ]                              If No, reason:  2.Ace Inhibitor/ARB: Yes [   ]                                     No  [    ]                                     If No, reason:  3.Statin:   Yes [ x  ]                  No  [   ]                  If No, reason:  4.Ecasa:  Yes  [ x  ]                  No   [   ]                  If No, reason:  Follow Up Appointments: Follow-up Information    [name], MD Follow up.  Specialty:  Cardiothoracic Surgery Why:  PA/LAT CXR to be taken (at [facility] which is in the same building as Dr. Everrett Coombe office) on at;Appointment time is at  Contact information: [address] [place] [address] [phone]        Jaynee Eagles, [place]. Call.   Specialty:  Urgent Care Why:  for a follow up appointment regarding further surveillance of HGA1C 5.9 (pre diabetes) Contact information: [place] [address] [place], [place], [place]. Go on 05/01/2017.   Specialty:  Cardiology Why:  Appointment time is at 10:30 am Contact information: [address] [place] [address] [phone]        [facility] Follow up.   Specialty:  Home Health Services Why:  Home Health RN, Physical Therapy and aide. agency will call to arrange initial visit Contact information: [address] [place] [address] [phone]           Signed: Ellamae Sia 04/24/2017, 1:19 PM

## 2017-04-20 ENCOUNTER — Inpatient Hospital Stay (HOSPITAL_COMMUNITY): Payer: Medicare HMO

## 2017-04-20 DIAGNOSIS — R079 Chest pain, unspecified: Secondary | ICD-10-CM

## 2017-04-20 LAB — BASIC METABOLIC PANEL
Anion gap: 11 (ref 5–15)
BUN: 13 mg/dL (ref 6–20)
CO2: 26 mmol/L (ref 22–32)
Calcium: 8.5 mg/dL — ABNORMAL LOW (ref 8.9–10.3)
Chloride: 100 mmol/L — ABNORMAL LOW (ref 101–111)
Creatinine, Ser: 0.92 mg/dL (ref 0.61–1.24)
GFR calc Af Amer: >60 mL/min (ref >60)
GFR calc non Af Amer: >60 mL/min (ref >60)
Glucose, Bld: 160 mg/dL — ABNORMAL HIGH (ref 65–99)
Potassium: 4 mmol/L (ref 3.5–5.1)
Sodium: 137 mmol/L (ref 135–145)

## 2017-04-20 LAB — CBC
HCT: 34.9 % — ABNORMAL LOW (ref 39.0–52.0)
Hemoglobin: 11.8 g/dL — ABNORMAL LOW (ref 13.0–17.0)
MCH: 31.1 pg (ref 26.0–34.0)
MCHC: 33.8 g/dL (ref 30.0–36.0)
MCV: 91.8 fL (ref 78.0–100.0)
Platelets: 190 10*3/uL (ref 150–400)
RBC: 3.8 MIL/uL — ABNORMAL LOW (ref 4.22–5.81)
RDW: 14.1 % (ref 11.5–15.5)
WBC: 20.1 10*3/uL — ABNORMAL HIGH (ref 4.0–10.5)

## 2017-04-20 LAB — GLUCOSE, CAPILLARY
GLUCOSE-CAPILLARY: 163 mg/dL — AB (ref 65–99)
GLUCOSE-CAPILLARY: 140 mg/dL — AB (ref 65–99)
Glucose-Capillary: 149 mg/dL — ABNORMAL HIGH (ref 65–99)
Glucose-Capillary: 154 mg/dL — ABNORMAL HIGH (ref 65–99)

## 2017-04-20 LAB — URINALYSIS, ROUTINE W REFLEX MICROSCOPIC
BACTERIA UA: NONE SEEN
BILIRUBIN URINE: NEGATIVE
Glucose, UA: NEGATIVE mg/dL
Ketones, ur: NEGATIVE mg/dL
Leukocytes, UA: NEGATIVE
Nitrite: NEGATIVE
PROTEIN: NEGATIVE mg/dL
SPECIFIC GRAVITY, URINE: 1.02 (ref 1.005–1.030)
SQUAMOUS EPITHELIAL / LPF: NONE SEEN
pH: 5 (ref 5.0–8.0)

## 2017-04-20 MED ORDER — IRBESARTAN 150 MG PO TABS
75.0000 mg | ORAL_TABLET | Freq: Every day | ORAL | Status: DC
  Administered 2017-04-20 – 2017-04-24 (×5): 75 mg via ORAL
  Filled 2017-04-20 (×5): qty 1

## 2017-04-20 NOTE — Plan of Care (Signed)
NUTRITION EDUCATION NOTE  RD consulted for nutrition education regarding pre-diabetes.   Lab Results  Component Value Date   HGBA1C 5.9 (H) 04/15/2017   Patient was extremely lethargic on RD arrival. Spoke mostly with son and spouse. They were both rather surprised to hear patient had pre-diabetes. They believe patient follows a very healthy lifestyle. However, they note there is a very strong hx of DM in their family  Diet recall:  Breakfast: Typically mixes oatmeal, fresh fruit, some dry cereal and milk in a blender and drinks as a shake Lunch: Skips mostly or eats something small, such as just some [place] on some bread Dinner: Cultural items ([place]). Almost no red meat. Eats chicken, [place], fish often. Eats legumes frequently. Eats salads Beverages: Mostly water. Does consume juice and "loves sweet tea" though they say he doesn't drink this often.  Other: Eats potatoes 1/week. Eats both white and wheat bread. Snacks on fruit often  Patient does have a few area in his diet that could be improved.   RD reviewed how skipping meals affects blood sugars. Patient should try to eat something in the middle of the day; it does not need to be a full meal.   He drinks juice and apparently occassionally will drink soda and sweet tea. Dismissed the notion that juice is a healthy beverage and recommended he decrease consumption of this. They feel the patient will have no issue drinking mostly water  He also sounds to habitually snack on fruit. While fruit is fine in moderation, explained how fruit is high in natural sugars and will increase BG. RD asked that patient consume fruit with protein sources, such as cheese or PB to help balance the snack.   Patient apparently does read labels. He typically looks for sodium. RD asked that the patient aim to consume no more than 80g carbs/meal. Visually, his meals should follow the "Plate method" and contain ~25-50% protein, 25-50% veg and 25%  starch/grains.   Recommended patient eat exclusive whole grains.   Advocated for physical activity once able.   RD provided handouts titled "Type 2 diabetes nutrition therapy" and "Diabetes Label Reading Tips" from the Academy of Nutrition and Dietetics, as well as a copy of the Diabetic "My plate"  Summary of Recommendations 1. Do not skip meals! Eat atleast 3x/day 2. Careful of fruit intake. Pair fruit with protein/veg sources.  3. Drink juice  no more than 1-2x a week 4. Switch to whole grains 5. Follow the plate method. 25% of meal being carbs, 25-50% protein and 25-50% vegetables. Aim for no more than 80g carb/meal.  6. Exercise as able.   Expect Good compliance. Patient sounds to have a generally healthy diet ad family seems to be very motivated to help patient improve his sugars.   Body mass index is 32.62 kg/m. Pt meets criteria for Obese based on current BMI.  No further nutrition interventions warranted at this time. If additional nutrition issues arise, please re-consult RD.  Burtis Junes RD, LDN, CNSC Clinical Nutrition Pager: [phone] 04/20/2017 3:14 PM

## 2017-04-20 NOTE — Progress Notes (Signed)
[address]       [place] [address]             [phone]      3 Days Post-Op Procedure(s) (LRB): CORONARY ARTERY BYPASS GRAFTING (CABG) x4 , using left internal mammary artery  to LAD and right leg greater saphenous vein harvested endoscopically  to PDA, Diagonal I and Circumflex (N/A) TRANSESOPHAGEAL ECHOCARDIOGRAM (TEE) (N/A) Subjective: Feels ok, primary issue is neck discomfort  Objective: Vital signs in last 24 hours: Temp:  [97.6 F (36.4 C)-99.7 F (37.6 C)] 98.5 F (36.9 C) (03/02 0729) Pulse Rate:  [93-109] 100 (03/02 0729) Cardiac Rhythm: Sinus tachycardia (03/01 1900) Resp:  [16-35] 20 (03/02 0729) BP: (123-172)/(71-100) 140/73 (03/02 0729) SpO2:  [91 %-96 %] 96 % (03/02 0729) Weight:  [202 lb 1.6 oz (91.7 kg)] 202 lb 1.6 oz (91.7 kg) (03/02 0406)  Hemodynamic parameters for last 24 hours:    Intake/Output from previous day: 03/01 0701 - 03/02 0700 In: 769.4 [P.O.:660; I.V.:59.4; IV Piggyback:50] Out: 745 [Urine:745] Intake/Output this shift: No intake/output data recorded.  General appearance: alert, cooperative and no distress Heart: regular rate and rhythm Lungs: mildly dim in bases Abdomen: benign Extremities: min edema BLE Wound: incis healing well  Lab Results: Recent Labs    04/19/17 0400 04/20/17 0432  WBC 18.5* 20.1*  HGB 10.5* 11.8*  HCT 31.5* 34.9*  PLT 131* 190   BMET:  Recent Labs    04/19/17 0400 04/20/17 0432  NA 137 137  K 3.6 4.0  CL 103 100*  CO2 25 26  GLUCOSE 161* 160*  BUN 13 13  CREATININE 0.94 0.92  CALCIUM 8.3* 8.5*    PT/INR:  Recent Labs    04/17/17 1335  LABPROT 18.5*  INR 1.55   ABG    Component Value Date/Time   PHART 7.318 (L) 04/17/2017 2141   HCO3 24.0 04/17/2017 2141   TCO2 23 04/18/2017 1545   ACIDBASEDEF 2.0 04/17/2017 2141   O2SAT 82.9 04/19/2017 0418   CBG (last 3)  Recent Labs    04/19/17 1625 04/19/17 2126 04/20/17 0557  GLUCAP 151* 166* 149*     Meds Scheduled Meds: . aspirin EC  81 mg Oral Daily  . carvedilol  12.5 mg Oral BID WC  . Chlorhexidine Gluconate Cloth  6 each Topical Daily  . clopidogrel  75 mg Oral Daily  . docusate sodium  200 mg Oral Daily  . enoxaparin (LOVENOX) injection  40 mg Subcutaneous QHS  . furosemide  40 mg Oral Daily  . insulin aspart  0-24 Units Subcutaneous TID AC & HS  . mouth rinse  15 mL Mouth Rinse BID  . pantoprazole  40 mg Oral QAC breakfast  . potassium chloride  20 mEq Oral Daily  . rosuvastatin  20 mg Oral q1800  . sodium chloride flush  10-40 mL Intracatheter Q12H  . sodium chloride flush  3 mL Intravenous Q12H   Continuous Infusions: . sodium chloride     PRN Meds:.sodium chloride, acetaminophen, bisacodyl **OR** bisacodyl, guaiFENesin, ondansetron **OR** ondansetron (ZOFRAN) IV, oxyCODONE, sodium chloride flush, sodium chloride flush, traMADol  Xrays Dg Chest Port 1 View  Result Date: 04/19/2017 CLINICAL DATA:  Chest tube placement EXAM: PORTABLE CHEST 1 VIEW COMPARISON:  Yesterday FINDINGS: Swan-Ganz catheter has been removed from the stably located right IJ sheath. Left chest tube is been removed. No pneumothorax. Stable low volumes with small left effusion. Stable heart size after CABG. IMPRESSION: 1.  Removed chest tube with no visible pneumothorax. 2. Stable low volumes with small effusion on the left. Electronically Signed   By: Monte Fantasia M.D.   On: 04/19/2017 07:47    Assessment/Plan: S/P Procedure(s) (LRB): CORONARY ARTERY BYPASS GRAFTING (CABG) x4 , using left internal mammary artery  to LAD and right leg greater saphenous vein harvested endoscopically  to PDA, Diagonal I and Circumflex (N/A) TRANSESOPHAGEAL ECHOCARDIOGRAM (TEE) (N/A)  1 overall making adequate progress 2 BP up at times- should be able to tol low dose ARB( was on preop) , will start. HR in low 100's at times, cont coreg- may need dose adjustment over time 3 wean O2 off, cont pulm toilet 4  increasing leukocytosis, no fevers, no evid  Of incis infection, will check UA- monitor closely 5 H/H improved ABL anemia, platelet count also improved 6 cont gentle diuresis for volume overload 7 renal fxn normal 8 A1C 5.9- needs aggressive diet control/lifestyle modification- sugars currently 140's-160's, will ask diabetic coordinator to see as has multiple findings of insulin resistance(HTN,Obesity,CAD)  LOS: 5 days    [name] 04/20/2017

## 2017-04-20 NOTE — Progress Notes (Signed)
Patient ambulated in hallway 550 feet with front wheeled walker.  Patient did not require O2 supplementation and O2 sat remained in the 90's throughout.  Will continue to monitor.

## 2017-04-21 DIAGNOSIS — I25118 Atherosclerotic heart disease of native coronary artery with other forms of angina pectoris: Secondary | ICD-10-CM

## 2017-04-21 LAB — CBC WITH DIFFERENTIAL/PLATELET
BASOS PCT: 0 %
Basophils Absolute: 0 10*3/uL (ref 0.0–0.1)
EOS PCT: 1 %
Eosinophils Absolute: 0.2 10*3/uL (ref 0.0–0.7)
HCT: 31.9 % — ABNORMAL LOW (ref 39.0–52.0)
Hemoglobin: 10.8 g/dL — ABNORMAL LOW (ref 13.0–17.0)
LYMPHS ABS: 2 10*3/uL (ref 0.7–4.0)
Lymphocytes Relative: 13 %
MCH: 31 pg (ref 26.0–34.0)
MCHC: 33.9 g/dL (ref 30.0–36.0)
MCV: 91.7 fL (ref 78.0–100.0)
MONOS PCT: 10 %
Monocytes Absolute: 1.5 10*3/uL — ABNORMAL HIGH (ref 0.1–1.0)
Neutro Abs: 11.7 10*3/uL — ABNORMAL HIGH (ref 1.7–7.7)
Neutrophils Relative %: 76 %
Platelets: 198 10*3/uL (ref 150–400)
RBC: 3.48 MIL/uL — AB (ref 4.22–5.81)
RDW: 14 % (ref 11.5–15.5)
WBC: 15.4 10*3/uL — AB (ref 4.0–10.5)

## 2017-04-21 LAB — GLUCOSE, CAPILLARY
GLUCOSE-CAPILLARY: 154 mg/dL — AB (ref 65–99)
Glucose-Capillary: 146 mg/dL — ABNORMAL HIGH (ref 65–99)
Glucose-Capillary: 188 mg/dL — ABNORMAL HIGH (ref 65–99)
Glucose-Capillary: 169 mg/dL — ABNORMAL HIGH (ref 65–99)

## 2017-04-21 NOTE — Progress Notes (Addendum)
[address]       [place] [address]             [phone]      4 Days Post-Op Procedure(s) (LRB): CORONARY ARTERY BYPASS GRAFTING (CABG) x4 , using left internal mammary artery  to LAD and right leg greater saphenous vein harvested endoscopically  to PDA, Diagonal I and Circumflex (N/A) TRANSESOPHAGEAL ECHOCARDIOGRAM (TEE) (N/A) Subjective: Feels a little better, still some SOB, remains pretty weak  Objective: Vital signs in last 24 hours: Temp:  [97.6 F (36.4 C)-98.8 F (37.1 C)] 98.6 F (37 C) (03/03 0433) Pulse Rate:  [91-97] 91 (03/03 0500) Cardiac Rhythm: Normal sinus rhythm (03/03 0749) Resp:  [14-26] 14 (03/03 0500) BP: (117-134)/(66-80) 117/72 (03/03 0433) SpO2:  [88 %-97 %] 97 % (03/03 0500) Weight:  [202 lb (91.6 kg)] 202 lb (91.6 kg) (03/03 0500)  Hemodynamic parameters for last 24 hours:    Intake/Output from previous day: 03/02 0701 - 03/03 0700 In: 840 [P.O.:840] Out: 401 [Urine:400; Stool:1] Intake/Output this shift: No intake/output data recorded.  General appearance: alert, cooperative and no distress Heart: regular rate and rhythm Lungs: somewhat caorse insp BS, improves with cough Abdomen: benign Extremities: + R>L LE edema Wound: incis healing well  Lab Results: Recent Labs    04/20/17 0432 04/21/17 0440  WBC 20.1* 15.4*  HGB 11.8* 10.8*  HCT 34.9* 31.9*  PLT 190 198   BMET:  Recent Labs    04/19/17 0400 04/20/17 0432  NA 137 137  K 3.6 4.0  CL 103 100*  CO2 25 26  GLUCOSE 161* 160*  BUN 13 13  CREATININE 0.94 0.92  CALCIUM 8.3* 8.5*    PT/INR: No results for input(s): LABPROT, INR in the last 72 hours. ABG    Component Value Date/Time   PHART 7.318 (L) 04/17/2017 2141   HCO3 24.0 04/17/2017 2141   TCO2 23 04/18/2017 1545   ACIDBASEDEF 2.0 04/17/2017 2141   O2SAT 82.9 04/19/2017 0418   CBG (last 3)  Recent Labs    04/20/17 1621 04/20/17 2101 04/21/17 0618  GLUCAP 154* 140* 146*     Meds Scheduled Meds: . aspirin EC  81 mg Oral Daily  . carvedilol  12.5 mg Oral BID WC  . Chlorhexidine Gluconate Cloth  6 each Topical Daily  . clopidogrel  75 mg Oral Daily  . docusate sodium  200 mg Oral Daily  . enoxaparin (LOVENOX) injection  40 mg Subcutaneous QHS  . furosemide  40 mg Oral Daily  . insulin aspart  0-24 Units Subcutaneous TID AC & HS  . irbesartan  75 mg Oral Daily  . mouth rinse  15 mL Mouth Rinse BID  . pantoprazole  40 mg Oral QAC breakfast  . potassium chloride  20 mEq Oral Daily  . rosuvastatin  20 mg Oral q1800  . sodium chloride flush  10-40 mL Intracatheter Q12H  . sodium chloride flush  3 mL Intravenous Q12H   Continuous Infusions: . sodium chloride     PRN Meds:.sodium chloride, acetaminophen, bisacodyl **OR** bisacodyl, guaiFENesin, ondansetron **OR** ondansetron (ZOFRAN) IV, oxyCODONE, sodium chloride flush, sodium chloride flush, traMADol  Xrays Dg Chest 2 View  Result Date: 04/20/2017 CLINICAL DATA:  77 year old male status post CABG postop day 3. EXAM: CHEST  2 VIEW COMPARISON:  04/19/2017 and earlier. FINDINGS: AP and lateral views of the chest. Right IJ introducer sheath removed. Epicardial pacer wires remain. Continued low lung volumes. Small left versus  bilateral pleural effusions. Streaky left lung base opacity most resembles atelectasis. Linear atelectasis at the right hilum. No pneumothorax or pulmonary edema. Stable cardiac size and mediastinal contours. Visualized tracheal air column is within normal limits. Visible bowel gas pattern within normal limits. IMPRESSION: 1. Right IJ introducer sheath removed. 2. No pneumothorax or pulmonary edema. 3. Low lung volumes with atelectasis, small left side versus bilateral pleural effusion(s). Electronically Signed   By: Genevie Ann M.D.   On: 04/20/2017 10:09    Assessment/Plan: S/P Procedure(s) (LRB): CORONARY ARTERY BYPASS GRAFTING (CABG) x4 , using left internal mammary artery  to LAD and right  leg greater saphenous vein harvested endoscopically  to PDA, Diagonal I and Circumflex (N/A) TRANSESOPHAGEAL ECHOCARDIOGRAM (TEE) (N/A)  1 remains pretty weak, but better than yesterday 2 No fevers. Leukocytosis is improved, does have increased  neutrophiles count- may have some bronchitis- push pulm toilet and mobilization as able. UA is neg 3 BP control improved on ARB 4 sugars fairly well controlled - Dietician has spoken to patient and given standard advice- hopefully he will limit carb intake more than preop with severe metabolic syndrome   LOS: 6 days    [name] 04/21/2017 Patient seen and examined. Agree with above  Remo Lipps C. Roxan Hockey, MD [facility] [phone]

## 2017-04-21 NOTE — Plan of Care (Signed)
  Progressing Health Behavior/Discharge Planning: Ability to manage health-related needs will improve 04/21/2017 0132 - Progressing by Peggye Pitt, RN Clinical Measurements: Will remain free from infection 04/21/2017 0132 - Progressing by Peggye Pitt, RN Activity: Risk for activity intolerance will decrease 04/21/2017 0132 - Progressing by Peggye Pitt, RN Coping: Level of anxiety will decrease 04/21/2017 0132 - Progressing by Peggye Pitt, RN Skin Integrity: Risk for impaired skin integrity will decrease 04/21/2017 0132 - Progressing by Peggye Pitt, RN   Completed/Met Education: Knowledge of General Education information will improve 04/21/2017 0132 - Completed/Met by Peggye Pitt, RN Urinary Elimination: Ability to achieve and maintain adequate renal perfusion and functioning will improve 04/21/2017 0132 - Completed/Met by Peggye Pitt, RN

## 2017-04-22 LAB — GLUCOSE, CAPILLARY: Glucose-Capillary: 138 mg/dL — ABNORMAL HIGH (ref 65–99)

## 2017-04-22 MED ORDER — FUROSEMIDE 40 MG PO TABS
40.0000 mg | ORAL_TABLET | Freq: Every day | ORAL | Status: AC
  Administered 2017-04-22 – 2017-04-24 (×3): 40 mg via ORAL
  Filled 2017-04-22 (×3): qty 1

## 2017-04-22 NOTE — Progress Notes (Signed)
EPW d/c'd per order and per protocol. VSS. Tips intact. Pt and family educated  On need for q15 min and bedrest x1. Call bell and phone within reach. Family at bedside. Will continue to monitor.

## 2017-04-22 NOTE — Care Management Important Message (Signed)
Important Message  Patient Details  Name: [name] MRN: [id number] Date of Birth: 1940/10/28   Medicare Important Message Given:  Yes    Orbie Pyo 04/22/2017, 12:08 PM

## 2017-04-22 NOTE — Progress Notes (Addendum)
      [address]       [place] [address]             [phone]      5 Days Post-Op Procedure(s) (LRB): CORONARY ARTERY BYPASS GRAFTING (CABG) x4 , using left internal mammary artery  to LAD and right leg greater saphenous vein harvested endoscopically  to PDA, Diagonal I and Circumflex (N/A) TRANSESOPHAGEAL ECHOCARDIOGRAM (TEE) (N/A)   Subjective:  [name] is doing okay.  He has no specific complaints.  He is ambulating in the hallway with walker.  + ambulation  Objective: Vital signs in last 24 hours: Temp:  [97.8 F (36.6 C)-100.3 F (37.9 C)] 99.5 F (37.5 C) (03/04 0511) Pulse Rate:  [90-97] 90 (03/04 0511) Cardiac Rhythm: Sinus tachycardia (03/04 0700) Resp:  [17-22] 18 (03/04 0511) BP: (111-129)/(67-75) 129/75 (03/04 0511) SpO2:  [91 %-97 %] 94 % (03/04 0511) Weight:  [202 lb 6.4 oz (91.8 kg)] 202 lb 6.4 oz (91.8 kg) (03/04 0621)  Intake/Output from previous day: 03/03 0701 - 03/04 0700 In: 600 [P.O.:600] Out: 425 [Urine:425]  General appearance: alert, cooperative and no distress Heart: regular rate and rhythm Lungs: clear to auscultation bilaterally Abdomen: soft, non-tender; bowel sounds normal; no masses,  no organomegaly Extremities: edema trace Wound: clean and dry  Lab Results: Recent Labs    04/20/17 0432 04/21/17 0440  WBC 20.1* 15.4*  HGB 11.8* 10.8*  HCT 34.9* 31.9*  PLT 190 198   BMET:  Recent Labs    04/20/17 0432  NA 137  K 4.0  CL 100*  CO2 26  GLUCOSE 160*  BUN 13  CREATININE 0.92  CALCIUM 8.5*    PT/INR: No results for input(s): LABPROT, INR in the last 72 hours. ABG    Component Value Date/Time   PHART 7.318 (L) 04/17/2017 2141   HCO3 24.0 04/17/2017 2141   TCO2 23 04/18/2017 1545   ACIDBASEDEF 2.0 04/17/2017 2141   O2SAT 82.9 04/19/2017 0418   CBG (last 3)  Recent Labs    04/21/17 1631 04/21/17 2106 04/22/17 0617  GLUCAP 154* 169* 138*    Assessment/Plan: S/P Procedure(s)  (LRB): CORONARY ARTERY BYPASS GRAFTING (CABG) x4 , using left internal mammary artery  to LAD and right leg greater saphenous vein harvested endoscopically  to PDA, Diagonal I and Circumflex (N/A) TRANSESOPHAGEAL ECHOCARDIOGRAM (TEE) (N/A)  1. CV- NSR, BP controlled- continue Coreg and Avapro 2. Pulm- no acute issues, off oxygen, continue IS 3. Renal- creatinine WNL, weight remains elevated, + LE edema- will continue Lasix for now 4. CBGs controlled, not a diabetic will d/c cbgs 5. Dispo- patient stable, will d/c EPW today, patient ambulating, but still a little weak, will continue to increase ambulation as tolerated, likely home Wednesday if mobility improves    LOS: 7 days    Ellwood Handler 04/22/2017  slowly improving  Check wbc tomorrow  I have seen and examined Sherald Hess and agree with the above assessment  and plan.  [name] MD Beeper [phone] Office [phone] 04/22/2017 1:18 PM

## 2017-04-22 NOTE — Progress Notes (Signed)
CARDIAC REHAB PHASE I   PRE:  Rate/Rhythm: 97 SR  BP:  Supine:   Sitting: 114/79  Standing:    SaO2: 95%RA  MODE:  Ambulation: 470 ft   POST:  Rate/Rhythm: 109 ST  BP:  Supine:   Sitting: 116/74  Standing:    SaO2: 97%RA 0845-0910 Pt walked 470 ft on RA with gait belt use and rolling walker. Family stated he has walker at home if needed. Stopped once to rest. To recliner after walk. Tolerated well but some DOE noted after walk and seemed tired.    Graylon Good, RN BSN  04/22/2017 9:08 AM

## 2017-04-22 NOTE — Progress Notes (Signed)
RN called to pts room. Pt c/o pain with cough in his upper abdomen. BP noted to have decreased from 130's-100's. PA made aware. Recycling VS. Pt refusing pain medicine at this time. Pt and family re-educated to brace chest with pillow with cough. Call bell and phone within reach.  Family at bedside. Will continue to monitor.

## 2017-04-23 ENCOUNTER — Inpatient Hospital Stay (HOSPITAL_COMMUNITY): Payer: Medicare HMO

## 2017-04-23 LAB — BASIC METABOLIC PANEL
Anion gap: 9 (ref 5–15)
BUN: 15 mg/dL (ref 6–20)
CO2: 26 mmol/L (ref 22–32)
Calcium: 8.4 mg/dL — ABNORMAL LOW (ref 8.9–10.3)
Chloride: 99 mmol/L — ABNORMAL LOW (ref 101–111)
Creatinine, Ser: 1.04 mg/dL (ref 0.61–1.24)
GFR calc Af Amer: >60 mL/min (ref >60)
GFR calc non Af Amer: >60 mL/min (ref >60)
Glucose, Bld: 147 mg/dL — ABNORMAL HIGH (ref 65–99)
Potassium: 4.1 mmol/L (ref 3.5–5.1)
Sodium: 134 mmol/L — ABNORMAL LOW (ref 135–145)

## 2017-04-23 LAB — CBC
HCT: 28.4 % — ABNORMAL LOW (ref 39.0–52.0)
Hemoglobin: 9.8 g/dL — ABNORMAL LOW (ref 13.0–17.0)
MCH: 31.4 pg (ref 26.0–34.0)
MCHC: 34.5 g/dL (ref 30.0–36.0)
MCV: 91 fL (ref 78.0–100.0)
Platelets: 267 10*3/uL (ref 150–400)
RBC: 3.12 MIL/uL — ABNORMAL LOW (ref 4.22–5.81)
RDW: 14.2 % (ref 11.5–15.5)
WBC: 13.1 10*3/uL — ABNORMAL HIGH (ref 4.0–10.5)

## 2017-04-23 MED ORDER — BENZONATATE 100 MG PO CAPS: 100.0000 mg | ORAL_CAPSULE | Freq: Three times a day (TID) | ORAL | Status: DC | PRN

## 2017-04-23 MED FILL — Lidocaine HCl IV Inj 20 MG/ML: INTRAVENOUS | Qty: 5 | Status: AC

## 2017-04-23 MED FILL — Electrolyte-R (PH 7.4) Solution: INTRAVENOUS | Qty: 3000 | Status: AC

## 2017-04-23 MED FILL — Mannitol IV Soln 20%: INTRAVENOUS | Qty: 500 | Status: AC

## 2017-04-23 MED FILL — Heparin Sodium (Porcine) Inj 1000 Unit/ML: INTRAMUSCULAR | Qty: 20 | Status: AC

## 2017-04-23 MED FILL — Sodium Bicarbonate IV Soln 8.4%: INTRAVENOUS | Qty: 50 | Status: AC

## 2017-04-23 MED FILL — Sodium Chloride IV Soln 0.9%: INTRAVENOUS | Qty: 2000 | Status: AC

## 2017-04-23 NOTE — Progress Notes (Signed)
CARDIAC REHAB PHASE I   PRE:  Rate/Rhythm: 89 SR  BP:  Supine:   Sitting: 151/75  Standing:    SaO2: 93%RA  MODE:  Ambulation: 470 ft   POST:  Rate/Rhythm: 103 ST  BP:  Supine:   Sitting: 153/73  Standing:    SaO2: 94%RA 1050-1117 Pt walked 470 ft with rolling walker and gait belt use and asst x 1. Did not need to stop and rest. To chair after walk. Discussed sternal precautions and keeping arms close to body with patient and family. Some DOE at the end of walk but sats good. Knows to get 2 more walks in today.   Graylon Good, RN BSN  04/23/2017 11:13 AM

## 2017-04-23 NOTE — Progress Notes (Signed)
      [address]       ,[place] [address]             [phone]      6 Days Post-Op Procedure(s) (LRB): CORONARY ARTERY BYPASS GRAFTING (CABG) x4 , using left internal mammary artery  to LAD and right leg greater saphenous vein harvested endoscopically  to PDA, Diagonal I and Circumflex (N/A) TRANSESOPHAGEAL ECHOCARDIOGRAM (TEE) (N/A)   Subjective:  Patient and son states he had a rough evening and night.  He started coughing a lot which caused a lot of sternal discomfort from the cough.  The son stated he coughed up a lot of sputum yesterday.  He has some drainage from his sternal wire sites.  Objective: Vital signs in last 24 hours: Temp:  [97.9 F (36.6 C)-99.1 F (37.3 C)] 98.9 F (37.2 C) (03/05 0514) Pulse Rate:  [62-99] 90 (03/05 0514) Cardiac Rhythm: Normal sinus rhythm (03/05 0700) Resp:  [17-31] 18 (03/05 0514) BP: (101-142)/(50-81) 128/74 (03/05 0514) SpO2:  [90 %-98 %] 97 % (03/05 0514) Weight:  [201 lb 1 oz (91.2 kg)] 201 lb 1 oz (91.2 kg) (03/05 0514)  Intake/Output from previous day: 03/04 0701 - 03/05 0700 In: 603 [P.O.:600; I.V.:3] Out: 1130 [Urine:1130]  General appearance: alert, cooperative and no distress Heart: regular rate and rhythm Lungs: clear to auscultation bilaterally Abdomen: soft, non-tender; bowel sounds normal; no masses,  no organomegaly Extremities: edema trace Wound: clean and dry  Lab Results: Recent Labs    04/21/17 0440 04/23/17 0231  WBC 15.4* 13.1*  HGB 10.8* 9.8*  HCT 31.9* 28.4*  PLT 198 267   BMET:  Recent Labs    04/23/17 0231  NA 134*  K 4.1  CL 99*  CO2 26  GLUCOSE 147*  BUN 15  CREATININE 1.04  CALCIUM 8.4*    PT/INR: No results for input(s): LABPROT, INR in the last 72 hours. ABG    Component Value Date/Time   PHART 7.318 (L) 04/17/2017 2141   HCO3 24.0 04/17/2017 2141   TCO2 23 04/18/2017 1545   ACIDBASEDEF 2.0 04/17/2017 2141   O2SAT 82.9 04/19/2017 0418   CBG (last 3)    Recent Labs    04/21/17 1631 04/21/17 2106 04/22/17 0617  GLUCAP 154* 169* 138*    Assessment/Plan: S/P Procedure(s) (LRB): CORONARY ARTERY BYPASS GRAFTING (CABG) x4 , using left internal mammary artery  to LAD and right leg greater saphenous vein harvested endoscopically  to PDA, Diagonal I and Circumflex (N/A) TRANSESOPHAGEAL ECHOCARDIOGRAM (TEE) (N/A)  1. CV- NSR, BP controlled- continue Coreg, Avapro 2. Pulm- + coughing a lot overnight, cleared a large amount of sputum, will get repeat 2V with new complaint of shortness of breath to evaluate pleural effusions, start Tessalon prn for cough suppression 3. Renal- creatinine WNL, weight is trending down, LE edema remains present, continue Lasix, potassium supplementation 4. Ambulation, continue to increase as tolerated 5. Dispo- patient with rough night due to coughing, increased pain.. Will get 2V to assess effusions and new complaint of shortness of breath, tessalon for cough suppression, continue to increase ambulation, if CXR is stable possibly for d/c in AM   LOS: 8 days    Ellwood Handler 04/23/2017

## 2017-04-23 NOTE — Care Management Note (Addendum)
Case Management Note  Patient Details  Name: [name] MRN: [id number] Date of Birth: [birth year]  Subjective/Objective:     CABG x 4               Action/Plan: Spoke to pt and wife at bedside. Pt has RW at home. Pt will be followed in the [place] ([facility]) [facility], PT and aide. Will need HHRN, PT, and aide order with F2F. Pt is requesting a RW with seat. Order in system, attending requested for cosign. Pt will have to pay out of pocket for RW with seat $114. He received RW last year. Has 3n1 bedside commode at home. Pt declined RW with seat.  Expected Discharge Date:  04/24/2017               Expected Discharge Plan:  Home Health  In-House Referral:  NA  Discharge planning Services  CM Consult  Post Acute Care Choice:  Home Health Choice offered to:  Spouse  DME Arranged:  Walker rolling with seat, 3n1 bedside commode DME Agency:  [place]:  RN, PT, Nurse's Aide [place] Agency:  [place]  Status of Service:  In process, will continue to follow  If discussed at Long Length of Stay Meetings, dates discussed:  04/23/2017  Additional Comments:  Erenest Rasher, RN 04/23/2017, 2:55 PM

## 2017-04-24 ENCOUNTER — Encounter: Payer: Self-pay | Admitting: Physician Assistant

## 2017-04-24 MED ORDER — BENZONATATE 100 MG PO CAPS: 100.0000 mg | ORAL_CAPSULE | Freq: Three times a day (TID) | ORAL | 0 refills | Status: DC | PRN

## 2017-04-24 MED ORDER — ACETAMINOPHEN 325 MG PO TABS: 650.0000 mg | ORAL_TABLET | Freq: Four times a day (QID) | ORAL | Status: DC | PRN

## 2017-04-24 MED ORDER — ROSUVASTATIN CALCIUM 20 MG PO TABS: 20.0000 mg | ORAL_TABLET | Freq: Every day | ORAL | 3 refills | Status: DC

## 2017-04-24 MED ORDER — TRAMADOL HCL 50 MG PO TABS: 50.0000 mg | ORAL_TABLET | ORAL | 0 refills | Status: DC | PRN

## 2017-04-24 NOTE — Progress Notes (Addendum)
      [address]       [place] [address]             [phone]      7 Days Post-Op Procedure(s) (LRB): CORONARY ARTERY BYPASS GRAFTING (CABG) x4 , using left internal mammary artery  to LAD and right leg greater saphenous vein harvested endoscopically  to PDA, Diagonal I and Circumflex (N/A) TRANSESOPHAGEAL ECHOCARDIOGRAM (TEE) (N/A)   Subjective:  No new complaints.  Feeling better than yesterday.  States he slept well.  + ambulation  + BM  Objective: Vital signs in last 24 hours: Temp:  [98.2 F (36.8 C)-99.3 F (37.4 C)] 98.6 F (37 C) (03/06 0322) Pulse Rate:  [90-97] 92 (03/06 0322) Cardiac Rhythm: Normal sinus rhythm (03/06 0700) Resp:  [19-28] 22 (03/06 0322) BP: (119-150)/(63-77) 150/72 (03/06 0322) SpO2:  [92 %-100 %] 93 % (03/06 0322) Weight:  [203 lb 3.2 oz (92.2 kg)] 203 lb 3.2 oz (92.2 kg) (03/06 0322)  Intake/Output from previous day: 03/05 0701 - 03/06 0700 In: 1080 [P.O.:1080] Out: 1600 [Urine:1600]  General appearance: alert, cooperative and no distress Heart: regular rate and rhythm Lungs: clear to auscultation bilaterally Abdomen: soft, non-tender; bowel sounds normal; no masses,  no organomegaly Extremities: edema trace Wound: clean and dry  Lab Results: Recent Labs    04/23/17 0231  WBC 13.1*  HGB 9.8*  HCT 28.4*  PLT 267   BMET:  Recent Labs    04/23/17 0231  NA 134*  K 4.1  CL 99*  CO2 26  GLUCOSE 147*  BUN 15  CREATININE 1.04  CALCIUM 8.4*    PT/INR: No results for input(s): LABPROT, INR in the last 72 hours. ABG    Component Value Date/Time   PHART 7.318 (L) 04/17/2017 2141   HCO3 24.0 04/17/2017 2141   TCO2 23 04/18/2017 1545   ACIDBASEDEF 2.0 04/17/2017 2141   O2SAT 82.9 04/19/2017 0418   CBG (last 3)  Recent Labs    04/21/17 1631 04/21/17 2106 04/22/17 0617  GLUCAP 154* 169* 138*    Assessment/Plan: S/P Procedure(s) (LRB): CORONARY ARTERY BYPASS GRAFTING (CABG) x4 , using left  internal mammary artery  to LAD and right leg greater saphenous vein harvested endoscopically  to PDA, Diagonal I and Circumflex (N/A) TRANSESOPHAGEAL ECHOCARDIOGRAM (TEE) (N/A)  1. CV- NSR, BP controlled- continue Coreg, Avapro 2. Pulm- no acute issues, off oxygen, CXR with small left pleural effusion/atelectasis, continue IS at discharge 3. Renal- creatinine stable, weight is trending down, will resume home HCTZ 4. Ambulation- improving, continue to ambulate TID,  5. Dispo- patient stable, looks good this morning, home arrangements have been made, home today if okay with Dr. Servando Snare   LOS: 9 days    Ellwood Handler 04/24/2017  pateint feels much better this am, plan home with family and home health nurse I have seen and examined Kean Gautreau and agree with the above assessment  and plan.  [name] MD Beeper [phone] Office [phone] 04/24/2017 9:22 AM

## 2017-04-24 NOTE — Progress Notes (Signed)
CARDIAC REHAB PHASE I   PRE:  Rate/Rhythm: 94 SR  BP:  Supine:   Sitting: 147/68  Standing:    SaO2:   MODE:  Ambulation: 470 ft   POST:  Rate/Rhythm: 108 ST PVCs  BP:  Supine:   Sitting: 153/74  Standing:    SaO2: 93%RA 8099-8338 Observed pt walking in hall independently with rolling walker and family. Pt with steady gait. After pt walked 470 ft, I assessed vitals and began ed. Discussed staying in the tube and gave handout. Discussed importance of IS, walking for ex (ex guidelines given), heart healthy food choices (watching carbs as A1C was 5.9) , sternal precautions,and CRP 2. Wrote down how to view discharge video. Will refer to [place] CRP 2.   Graylon Good, RN BSN  04/24/2017 9:18 AM

## 2017-04-25 ENCOUNTER — Telehealth (HOSPITAL_COMMUNITY): Payer: Self-pay

## 2017-04-25 DIAGNOSIS — Z951 Presence of aortocoronary bypass graft: Secondary | ICD-10-CM | POA: Diagnosis not present

## 2017-04-25 DIAGNOSIS — I25110 Atherosclerotic heart disease of native coronary artery with unstable angina pectoris: Secondary | ICD-10-CM | POA: Diagnosis not present

## 2017-04-25 DIAGNOSIS — E785 Hyperlipidemia, unspecified: Secondary | ICD-10-CM | POA: Diagnosis not present

## 2017-04-25 DIAGNOSIS — H532 Diplopia: Secondary | ICD-10-CM | POA: Diagnosis not present

## 2017-04-25 DIAGNOSIS — I2583 Coronary atherosclerosis due to lipid rich plaque: Secondary | ICD-10-CM | POA: Diagnosis not present

## 2017-04-25 DIAGNOSIS — K5790 Diverticulosis of intestine, part unspecified, without perforation or abscess without bleeding: Secondary | ICD-10-CM | POA: Diagnosis not present

## 2017-04-25 DIAGNOSIS — Z48812 Encounter for surgical aftercare following surgery on the circulatory system: Secondary | ICD-10-CM | POA: Diagnosis not present

## 2017-04-25 DIAGNOSIS — D751 Secondary polycythemia: Secondary | ICD-10-CM | POA: Diagnosis not present

## 2017-04-25 DIAGNOSIS — I10 Essential (primary) hypertension: Secondary | ICD-10-CM | POA: Diagnosis not present

## 2017-04-25 NOTE — Telephone Encounter (Signed)
Called and spoke with wife of patient - wife stated patient is asleep right now and would like to talk over with patient more about the program and the financial aspect. Patient will return phone call. Explained scheduling process to wife. Went over insurance with wife as well and she verbally stated she understands what he would be responsible for.

## 2017-04-25 NOTE — Telephone Encounter (Signed)
Patients insurance is active and benefits verified through [facility] - $10.00 co-pay, no deductible, out of pocket amount of $3,400/$0.00 has been met, no co-insurance, and no pre-authorization is required. Passport/reference [phone]  Will contact patient to see if interested in CR. If interested follow up appt will need to be completed. Once completed, patient will be contacted for scheduling upon review by the RN Navigator.

## 2017-04-26 ENCOUNTER — Other Ambulatory Visit: Payer: Self-pay

## 2017-04-26 NOTE — Patient Outreach (Signed)
[facility]) Care Management  04/26/2017  Hershel Corkery 06/15/1940 [id number]  Transition of care  Referral date: 04/25/17 Referral source: discharged from [facility]  On 04/24/17 Insurance: [employer]   Telephone call to patient regarding transition of care referral. Contact answering phone identified herself as patients spouse.  Explained to spouse RNCM would need to have verbal authorization from patient. Spouse would not allow RNCM to speak directly with patient.  Refused call.    PLAN; RNCM will refer patient to care management assistant to close due.  RNCM will notify patients primary MD of closure.   Quinn Plowman RN,BSN,CCM [facility] Telephonic  [phone]

## 2017-04-29 DIAGNOSIS — I25110 Atherosclerotic heart disease of native coronary artery with unstable angina pectoris: Secondary | ICD-10-CM | POA: Diagnosis not present

## 2017-04-29 DIAGNOSIS — D751 Secondary polycythemia: Secondary | ICD-10-CM | POA: Diagnosis not present

## 2017-04-29 DIAGNOSIS — Z48812 Encounter for surgical aftercare following surgery on the circulatory system: Secondary | ICD-10-CM | POA: Diagnosis not present

## 2017-04-29 DIAGNOSIS — H532 Diplopia: Secondary | ICD-10-CM | POA: Diagnosis not present

## 2017-04-29 DIAGNOSIS — I10 Essential (primary) hypertension: Secondary | ICD-10-CM | POA: Diagnosis not present

## 2017-04-29 DIAGNOSIS — E785 Hyperlipidemia, unspecified: Secondary | ICD-10-CM | POA: Diagnosis not present

## 2017-04-29 DIAGNOSIS — Z951 Presence of aortocoronary bypass graft: Secondary | ICD-10-CM | POA: Diagnosis not present

## 2017-04-29 DIAGNOSIS — K5790 Diverticulosis of intestine, part unspecified, without perforation or abscess without bleeding: Secondary | ICD-10-CM | POA: Diagnosis not present

## 2017-04-29 DIAGNOSIS — I2583 Coronary atherosclerosis due to lipid rich plaque: Secondary | ICD-10-CM | POA: Diagnosis not present

## 2017-04-30 DIAGNOSIS — H532 Diplopia: Secondary | ICD-10-CM | POA: Diagnosis not present

## 2017-04-30 DIAGNOSIS — I25110 Atherosclerotic heart disease of native coronary artery with unstable angina pectoris: Secondary | ICD-10-CM | POA: Diagnosis not present

## 2017-04-30 DIAGNOSIS — Z951 Presence of aortocoronary bypass graft: Secondary | ICD-10-CM | POA: Diagnosis not present

## 2017-04-30 DIAGNOSIS — E785 Hyperlipidemia, unspecified: Secondary | ICD-10-CM | POA: Diagnosis not present

## 2017-04-30 DIAGNOSIS — I10 Essential (primary) hypertension: Secondary | ICD-10-CM | POA: Diagnosis not present

## 2017-04-30 DIAGNOSIS — Z48812 Encounter for surgical aftercare following surgery on the circulatory system: Secondary | ICD-10-CM | POA: Diagnosis not present

## 2017-04-30 DIAGNOSIS — I2583 Coronary atherosclerosis due to lipid rich plaque: Secondary | ICD-10-CM | POA: Diagnosis not present

## 2017-04-30 DIAGNOSIS — D751 Secondary polycythemia: Secondary | ICD-10-CM | POA: Diagnosis not present

## 2017-04-30 DIAGNOSIS — K5790 Diverticulosis of intestine, part unspecified, without perforation or abscess without bleeding: Secondary | ICD-10-CM | POA: Diagnosis not present

## 2017-05-01 ENCOUNTER — Encounter: Payer: Self-pay | Admitting: Physician Assistant

## 2017-05-01 ENCOUNTER — Ambulatory Visit: Payer: Medicare HMO | Admitting: Physician Assistant

## 2017-05-01 VITALS — BP 98/52 | HR 88 | Ht 66.0 in | Wt 191.2 lb

## 2017-05-01 DIAGNOSIS — E785 Hyperlipidemia, unspecified: Secondary | ICD-10-CM

## 2017-05-01 DIAGNOSIS — R58 Hemorrhage, not elsewhere classified: Secondary | ICD-10-CM | POA: Diagnosis not present

## 2017-05-01 DIAGNOSIS — I10 Essential (primary) hypertension: Secondary | ICD-10-CM | POA: Diagnosis not present

## 2017-05-01 DIAGNOSIS — I25700 Atherosclerosis of coronary artery bypass graft(s), unspecified, with unstable angina pectoris: Secondary | ICD-10-CM

## 2017-05-01 MED ORDER — VALSARTAN 160 MG PO TABS: 160.0000 mg | ORAL_TABLET | Freq: Every day | ORAL | 3 refills | Status: DC

## 2017-05-01 MED ORDER — ROSUVASTATIN CALCIUM 40 MG PO TABS: 40.0000 mg | ORAL_TABLET | Freq: Every day | ORAL | 3 refills | Status: DC

## 2017-05-01 NOTE — Patient Instructions (Signed)
Medication Instructions:  Your physician has recommended you make the following change in your medication:  1. Diovan (160 mg) by mouth once daily. (dose change) 2. Rosuvastatin (40 mg) by mouth once daily. (dose change)  Labwork: Your physician recommends that you have the following lab work drawn today: CBC  Your physician recommends that you return for lab work on May 29, 2017 for FASTING LFT's/Lipid's. Nothing to eat or drink after midnight the night before the labs are drawn.   Testing/Procedures: None ordered today  Follow-Up: Your physician recommends that you keep your follow-up appointment scheduled with Dr. Burt Knack on August 05, 2017 at 11:40 am.  Your physician recommends that you schedule a follow-up appointment FOR NURSE VISIT OR HTN CLINIC FOR [place] ON May 29, 2017.  Any Other Special Instructions Will Be Listed Below (If Applicable).  If you need a refill on your cardiac medications before your next appointment, please call your pharmacy.

## 2017-05-01 NOTE — Progress Notes (Signed)
Cardiology Office Note    Date:  05/01/2017   ID:  [name], DOB 1940-06-16, MRN [id number]  PCP:  Jaynee Eagles, PA-C  Cardiologist:  Dr. Burt Knack  Chief Complaint: Hospital follow up s/p   CABG  History of Present Illness:   [name] is a 77 y.o. male CAD s/p BMS to LCx & OM 2008 and recent CABG, difficult to control HTN, HLD and prior ischemic stroke presented for hospital follow up.   He is s/p PCI in 2008 with BMS to the LCx and OM when he presented with [place]. He had residual diffuse disease in LAD and RCA. This was treated medically. He was admitted in 3/18 wit an ischemic CVA.   Admitted 04/14/17 for [place]/NSTEMI. Cath showed multivessel CAD s/p CABG x 4 (LIMA to LAD, SVG to Diag 1, SVG to distal circ, SVG to PDA). No surgical complications.   Here today for follow up.  Patient is short of breath during office visit after long distance walking from parking lot.  Family has parked car few blocks away.  No associated chest pain/tightness.  Oxygen saturations are staying in 93-94%.  He normally walks at home  with walker without shortness of breath. Swelling at R leg vein site with ecchymosis, both improving. No orthopnea, PND, chest pain, palpitations or melena. Has noted intermittent dizziness.   Past Medical History:  Diagnosis Date  . Blurred vision 05/17/2016  . CAD (coronary artery disease), native coronary artery 06/14/2008   a. BMS to LCx & OM 2008 b. 03/2017 CABG x 4 (LIMA to LAD, SVG to diag 1, SVG to distal Circ, SVG to PDA)  . Chest pain 04/16/2012  . Diplopia   . Essential hypertension 05/17/2016  . Hyperlipidemia with target low density lipoprotein (LDL) cholesterol less than 70 mg/dL 06/14/2008   Qualifier: Diagnosis of  By: Mare Ferrari, RMA, [name]    . Hypertension   . Hypertensive heart disease 06/14/2008   Qualifier: Diagnosis of  By: Mare Ferrari, [place], [name]    . Hypertensive urgency, malignant 04/16/2012  . Internal hemorrhoids 04/19/2012  . Ischemic stroke  ([place]) 05/02/2016  . pandiverticulosis 04/22/2012   12/17/2011. [place]. Juanita Craver MD. Colonoscopy. Moderate sized internal hemorrhoids and extensive pandiverticulosis. Repeat 5 years.    . Polycythemia vera(238.4) 04/17/2012  . Syncope and collapse 04/17/2012   Pt syncopized while sitting in bed giving history @ time of admission to hospital Tachycardic (appeared sinus) to 120s-130s and hypotensive to 50s/30s.  Unresponsive initially >> Spontaneously resolved after 2-3 minutes >> return to baseline ~30 minutes   . Vertigo     Past Surgical History:  Procedure Laterality Date  . CORONARY ARTERY BYPASS GRAFT N/A 04/17/2017   Procedure: CORONARY ARTERY BYPASS GRAFTING (CABG) x4 , using left internal mammary artery  to LAD and right leg greater saphenous vein harvested endoscopically  to PDA, Diagonal I and Circumflex;  Surgeon: [name], MD;  Location: [place];  Service: Open Heart Surgery;  Laterality: N/A;  . CORONARY STENT PLACEMENT    . LEFT HEART CATH AND CORONARY ANGIOGRAPHY N/A 04/16/2017   Procedure: LEFT HEART CATH AND CORONARY ANGIOGRAPHY;  Surgeon: Sherren Mocha, MD;  Location: [facility] CV [facility];  Service: Cardiovascular;  Laterality: N/A;  . TEE WITHOUT CARDIOVERSION N/A 04/17/2017   Procedure: TRANSESOPHAGEAL ECHOCARDIOGRAM (TEE);  Surgeon: [name], MD;  Location: [place];  Service: Open Heart Surgery;  Laterality: N/A;    Current Medications: Prior to Admission medications   Medication  Sig Start Date End Date Taking? Authorizing Provider  acetaminophen (TYLENOL) 325 MG tablet Take 2 tablets (650 mg total) by mouth every 6 (six) hours as needed for mild pain. 04/24/17   Barrett, Lodema Hong, PA-C  aspirin EC 81 MG tablet Take 81 mg by mouth daily.    [provider]  benzonatate (TESSALON) 100 MG capsule Take 1 capsule (100 mg total) by mouth 3 (three) times daily as needed for cough. 04/24/17   Barrett, Erin R, PA-C  carvedilol (COREG) 12.5 MG  tablet Take 1 tablet (12.5 mg total) 2 (two) times daily by mouth. Patient not taking: Reported on 04/14/2017 12/27/16   Jaynee Eagles, PA-C  clopidogrel (PLAVIX) 75 MG tablet Take 1 tablet (75 mg total) daily by mouth. Patient not taking: Reported on 04/14/2017 12/27/16   Jaynee Eagles, PA-C  hydrochlorothiazide (HYDRODIURIL) 25 MG tablet Take 1 tablet (25 mg total) by mouth daily. 05/31/16   Sherren Mocha, MD  rosuvastatin (CRESTOR) 20 MG tablet Take 1 tablet (20 mg total) by mouth daily at 6 PM. 04/24/17   Barrett, Erin R, PA-C  traMADol (ULTRAM) 50 MG tablet Take 1 tablet (50 mg total) by mouth every 4 (four) hours as needed for moderate pain. 04/24/17   Barrett, Erin R, PA-C  valsartan (DIOVAN) 320 MG tablet Take 1 tablet (320 mg total) by mouth daily. 07/26/16   Jaynee Eagles, PA-C    Allergies:   Lipitor [atorvastatin] and Pork-derived products   Social History   Socioeconomic History  . Marital status: Married    Spouse name: None  . Number of children: None  . Years of education: None  . Highest education level: None  Social Needs  . Financial resource strain: None  . Food insecurity - worry: None  . Food insecurity - inability: None  . Transportation needs - medical: None  . Transportation needs - non-medical: None  Occupational History  . None  Tobacco Use  . Smoking status: Never Smoker  . Smokeless tobacco: Never Used  Substance and Sexual Activity  . Alcohol use: No    Alcohol/week: 0.0 oz  . Drug use: No  . Sexual activity: None  Other Topics Concern  . None  Social History Narrative   Lives in [place] with Wife and 2 sons.  From [place].  To [place] ~2000.     Currently retired but worked in Veterinary surgeon     Family History:  The patient's family history includes Cancer in his sister; Heart disease in his mother.  ROS:   Please see the history of present illness.    ROS All other systems reviewed and are negative.   PHYSICAL EXAM:   VS:  BP (!) 98/52    Pulse 88   Ht 5\' 6"  (1.676 m)   Wt 191 lb 3.2 oz (86.7 kg)   SpO2 93%   BMI 30.86 kg/m    GEN: Well nourished, well developed, in no acute distress  HEENT: normal  Neck: no JVD, carotid bruits, or masses Cardiac: RRR; no murmurs, rubs, or gallops, Trace right  lower extremity edema  Respiratory:  clear to auscultation bilaterally, normal work of breathing GI: soft, nontender, nondistended, + BS MS: no deformity or atrophy  Skin: warm and dry, ecchymosis at right eg Neuro:  Alert and Oriented x 3, Strength and sensation are intact Psych: euthymic mood, full affect  Wt Readings from Last 3 Encounters:  05/01/17 191 lb 3.2 oz (86.7 kg)  04/24/17 203 lb 3.2  oz (92.2 kg)  11/21/16 191 lb 6.4 oz (86.8 kg)      Studies/Labs Reviewed:   EKG:  EKG is not ordered today.   Recent Labs: 04/15/2017: ALT 17 04/18/2017: Magnesium 2.4 04/23/2017: BUN 15; Creatinine, Ser 1.04; Hemoglobin 9.8; Platelets 267; Potassium 4.1; Sodium 134   Lipid Panel    Component Value Date/Time   CHOL 174 04/15/2017 0241   TRIG 46 04/15/2017 0241   HDL 39 (L) 04/15/2017 0241   CHOLHDL 4.5 04/15/2017 0241   VLDL 9 04/15/2017 0241   LDLCALC 126 (H) 04/15/2017 0241   LDLDIRECT 150.8 06/30/2008 1007    Additional studies/ records that were reviewed today include:   TEE 04/17/17 Result status: Final result   Septum: No Patent Foramen Ovale present.  Left atrium: Patent foramen ovale not present.  Aortic valve: The valve is trileaflet. No stenosis. Mild regurgitation.  Mitral valve: Trace regurgitation.  Right ventricle: Normal cavity size and ejection fraction.  Tricuspid valve: Trace regurgitation.       LEFT HEART CATH AND CORONARY ANGIOGRAPHY 04/16/17  Conclusion     The left ventricular systolic function is normal.  LV end diastolic pressure is mildly elevated.  The left ventricular ejection fraction is 55-65% by visual estimate.  Prox RCA lesion is 50% stenosed.  Mid RCA lesion is  95% stenosed.  Dist RCA lesion is 50% stenosed with 90% stenosed side branch in Ost RPDA.  Dist LM to Ost LAD lesion is 90% stenosed.  Ost 1st Diag lesion is 100% stenosed.  Prox LAD lesion is 85% stenosed.  Mid LAD lesion is 80% stenosed.  Ost 1st Mrg to 1st Mrg lesion is 80% stenosed.  Ost Cx lesion is 90% stenosed.  Prox Cx lesion is 50% stenosed.   1.  Critical left main and multivessel coronary artery disease with severe proximal LAD stenosis, severe ostial left circumflex stenosis and severe in-stent restenosis, and severe mid RCA stenosis 2.  Normal LV systolic function with mildly elevated LVEDP  Recommendations: The patient will be transferred to a cardiac stepdown bed.  IV heparin will be restarted and he will continue on IV nitroglycerin.  A cardiac surgical consultation will be placed for urgent coronary bypass surgery.  The patient is chest pain-free at the completion of the procedure but has critical multivessel disease.        ASSESSMENT & PLAN:    1. CAD  s/p BMS to LCx & OM 2008 and recent CABG No angina. Continue ASA, plavix and statin.   2. HTN - Low today at 98/52. Repeat check 102/50. Has intermittent dizziness. Continue coreg 12.5mg  BID. Reduce Diovan to 160mg  qd.   3. HLD - 04/15/2017: Cholesterol 174; HDL 39; LDL Cholesterol 126; Triglycerides 46; VLDL 9. Not truly allergic to lipitor.  - Tolerating Rosuvastatin well. Will increase to 40mg  qd. Repeat labs in few weeks.   4. Ecchymosis at Right leg vein harvest site with edema - Improving. Check CBC.    Medication Adjustments/Labs and Tests Ordered: Current medicines are reviewed at length with the patient today.  Concerns regarding medicines are outlined above.  Medication changes, Labs and Tests ordered today are listed in the Patient Instructions below. Patient Instructions  Medication Instructions:  Your physician has recommended you make the following change in your medication:  1.  Diovan (160 mg) by mouth once daily. (dose change) 2. Rosuvastatin (40 mg) by mouth once daily. (dose change)  Labwork: Your physician recommends that you have the following lab work  drawn today: CBC  Your physician recommends that you return for lab work on May 29, 2017 for FASTING LFT's/Lipid's. Nothing to eat or drink after midnight the night before the labs are drawn.   Testing/Procedures: None ordered today  Follow-Up: Your physician recommends that you keep your follow-up appointment scheduled with Dr. Burt Knack on August 05, 2017 at 11:40 am.  Your physician recommends that you schedule a follow-up appointment FOR NURSE VISIT OR HTN CLINIC FOR [place] ON May 29, 2017.  Any Other Special Instructions Will Be Listed Below (If Applicable).  If you need a refill on your cardiac medications before your next appointment, please call your pharmacy.      Jarrett Soho, [place]  05/01/2017 11:33 AM    [facility] [place], [place], [place]  [address] Phone: [phone]; Fax: [phone]

## 2017-05-02 DIAGNOSIS — E785 Hyperlipidemia, unspecified: Secondary | ICD-10-CM | POA: Diagnosis not present

## 2017-05-02 DIAGNOSIS — I2583 Coronary atherosclerosis due to lipid rich plaque: Secondary | ICD-10-CM | POA: Diagnosis not present

## 2017-05-02 DIAGNOSIS — I25110 Atherosclerotic heart disease of native coronary artery with unstable angina pectoris: Secondary | ICD-10-CM | POA: Diagnosis not present

## 2017-05-02 DIAGNOSIS — I10 Essential (primary) hypertension: Secondary | ICD-10-CM | POA: Diagnosis not present

## 2017-05-02 DIAGNOSIS — H532 Diplopia: Secondary | ICD-10-CM | POA: Diagnosis not present

## 2017-05-02 DIAGNOSIS — D751 Secondary polycythemia: Secondary | ICD-10-CM | POA: Diagnosis not present

## 2017-05-02 DIAGNOSIS — Z48812 Encounter for surgical aftercare following surgery on the circulatory system: Secondary | ICD-10-CM | POA: Diagnosis not present

## 2017-05-02 DIAGNOSIS — K5790 Diverticulosis of intestine, part unspecified, without perforation or abscess without bleeding: Secondary | ICD-10-CM | POA: Diagnosis not present

## 2017-05-02 DIAGNOSIS — Z951 Presence of aortocoronary bypass graft: Secondary | ICD-10-CM | POA: Diagnosis not present

## 2017-05-02 LAB — CBC
HEMATOCRIT: 31.6 % — AB (ref 37.5–51.0)
HEMOGLOBIN: 10.6 g/dL — AB (ref 13.0–17.7)
MCH: 30.2 pg (ref 26.6–33.0)
MCHC: 33.5 g/dL (ref 31.5–35.7)
MCV: 90 fL (ref 79–97)
Platelets: 453 10*3/uL — ABNORMAL HIGH (ref 150–379)
RBC: 3.51 x10E6/uL — AB (ref 4.14–5.80)
RDW: 14.8 % (ref 12.3–15.4)
WBC: 13.7 10*3/uL — ABNORMAL HIGH (ref 3.4–10.8)

## 2017-05-03 DIAGNOSIS — I10 Essential (primary) hypertension: Secondary | ICD-10-CM | POA: Diagnosis not present

## 2017-05-03 DIAGNOSIS — K5790 Diverticulosis of intestine, part unspecified, without perforation or abscess without bleeding: Secondary | ICD-10-CM | POA: Diagnosis not present

## 2017-05-03 DIAGNOSIS — I25110 Atherosclerotic heart disease of native coronary artery with unstable angina pectoris: Secondary | ICD-10-CM | POA: Diagnosis not present

## 2017-05-03 DIAGNOSIS — E785 Hyperlipidemia, unspecified: Secondary | ICD-10-CM | POA: Diagnosis not present

## 2017-05-03 DIAGNOSIS — I2583 Coronary atherosclerosis due to lipid rich plaque: Secondary | ICD-10-CM | POA: Diagnosis not present

## 2017-05-03 DIAGNOSIS — Z951 Presence of aortocoronary bypass graft: Secondary | ICD-10-CM | POA: Diagnosis not present

## 2017-05-03 DIAGNOSIS — H532 Diplopia: Secondary | ICD-10-CM | POA: Diagnosis not present

## 2017-05-03 DIAGNOSIS — Z48812 Encounter for surgical aftercare following surgery on the circulatory system: Secondary | ICD-10-CM | POA: Diagnosis not present

## 2017-05-03 DIAGNOSIS — D751 Secondary polycythemia: Secondary | ICD-10-CM | POA: Diagnosis not present

## 2017-05-07 ENCOUNTER — Telehealth (HOSPITAL_COMMUNITY): Payer: Self-pay

## 2017-05-07 DIAGNOSIS — I2583 Coronary atherosclerosis due to lipid rich plaque: Secondary | ICD-10-CM | POA: Diagnosis not present

## 2017-05-07 DIAGNOSIS — I10 Essential (primary) hypertension: Secondary | ICD-10-CM | POA: Diagnosis not present

## 2017-05-07 DIAGNOSIS — K5790 Diverticulosis of intestine, part unspecified, without perforation or abscess without bleeding: Secondary | ICD-10-CM | POA: Diagnosis not present

## 2017-05-07 DIAGNOSIS — Z951 Presence of aortocoronary bypass graft: Secondary | ICD-10-CM | POA: Diagnosis not present

## 2017-05-07 DIAGNOSIS — Z48812 Encounter for surgical aftercare following surgery on the circulatory system: Secondary | ICD-10-CM | POA: Diagnosis not present

## 2017-05-07 DIAGNOSIS — I25110 Atherosclerotic heart disease of native coronary artery with unstable angina pectoris: Secondary | ICD-10-CM | POA: Diagnosis not present

## 2017-05-07 DIAGNOSIS — H532 Diplopia: Secondary | ICD-10-CM | POA: Diagnosis not present

## 2017-05-07 DIAGNOSIS — E785 Hyperlipidemia, unspecified: Secondary | ICD-10-CM | POA: Diagnosis not present

## 2017-05-07 DIAGNOSIS — D751 Secondary polycythemia: Secondary | ICD-10-CM | POA: Diagnosis not present

## 2017-05-07 NOTE — Telephone Encounter (Signed)
Called to speak with patient in regards to [place] with wife and wife stated that patient is not feeling well today. Explained Cardiac Rehab to wife and she would like to speak with patient first. Wife stated he is currently receiving HHPT. She is unsure when patient will complete HHPT. Will follow up on Friday per patient.

## 2017-05-09 DIAGNOSIS — I25110 Atherosclerotic heart disease of native coronary artery with unstable angina pectoris: Secondary | ICD-10-CM | POA: Diagnosis not present

## 2017-05-09 DIAGNOSIS — K5790 Diverticulosis of intestine, part unspecified, without perforation or abscess without bleeding: Secondary | ICD-10-CM | POA: Diagnosis not present

## 2017-05-09 DIAGNOSIS — I10 Essential (primary) hypertension: Secondary | ICD-10-CM | POA: Diagnosis not present

## 2017-05-09 DIAGNOSIS — D751 Secondary polycythemia: Secondary | ICD-10-CM | POA: Diagnosis not present

## 2017-05-09 DIAGNOSIS — H532 Diplopia: Secondary | ICD-10-CM | POA: Diagnosis not present

## 2017-05-09 DIAGNOSIS — I2583 Coronary atherosclerosis due to lipid rich plaque: Secondary | ICD-10-CM | POA: Diagnosis not present

## 2017-05-09 DIAGNOSIS — Z48812 Encounter for surgical aftercare following surgery on the circulatory system: Secondary | ICD-10-CM | POA: Diagnosis not present

## 2017-05-09 DIAGNOSIS — Z951 Presence of aortocoronary bypass graft: Secondary | ICD-10-CM | POA: Diagnosis not present

## 2017-05-09 DIAGNOSIS — E785 Hyperlipidemia, unspecified: Secondary | ICD-10-CM | POA: Diagnosis not present

## 2017-05-10 DIAGNOSIS — I10 Essential (primary) hypertension: Secondary | ICD-10-CM | POA: Diagnosis not present

## 2017-05-10 DIAGNOSIS — D751 Secondary polycythemia: Secondary | ICD-10-CM | POA: Diagnosis not present

## 2017-05-10 DIAGNOSIS — I25110 Atherosclerotic heart disease of native coronary artery with unstable angina pectoris: Secondary | ICD-10-CM | POA: Diagnosis not present

## 2017-05-10 DIAGNOSIS — Z951 Presence of aortocoronary bypass graft: Secondary | ICD-10-CM | POA: Diagnosis not present

## 2017-05-10 DIAGNOSIS — E785 Hyperlipidemia, unspecified: Secondary | ICD-10-CM | POA: Diagnosis not present

## 2017-05-10 DIAGNOSIS — H532 Diplopia: Secondary | ICD-10-CM | POA: Diagnosis not present

## 2017-05-10 DIAGNOSIS — I2583 Coronary atherosclerosis due to lipid rich plaque: Secondary | ICD-10-CM | POA: Diagnosis not present

## 2017-05-10 DIAGNOSIS — K5790 Diverticulosis of intestine, part unspecified, without perforation or abscess without bleeding: Secondary | ICD-10-CM | POA: Diagnosis not present

## 2017-05-10 DIAGNOSIS — Z48812 Encounter for surgical aftercare following surgery on the circulatory system: Secondary | ICD-10-CM | POA: Diagnosis not present

## 2017-05-13 DIAGNOSIS — Z951 Presence of aortocoronary bypass graft: Secondary | ICD-10-CM | POA: Diagnosis not present

## 2017-05-13 DIAGNOSIS — E785 Hyperlipidemia, unspecified: Secondary | ICD-10-CM | POA: Diagnosis not present

## 2017-05-13 DIAGNOSIS — I2583 Coronary atherosclerosis due to lipid rich plaque: Secondary | ICD-10-CM | POA: Diagnosis not present

## 2017-05-13 DIAGNOSIS — I25110 Atherosclerotic heart disease of native coronary artery with unstable angina pectoris: Secondary | ICD-10-CM | POA: Diagnosis not present

## 2017-05-13 DIAGNOSIS — D751 Secondary polycythemia: Secondary | ICD-10-CM | POA: Diagnosis not present

## 2017-05-13 DIAGNOSIS — H532 Diplopia: Secondary | ICD-10-CM | POA: Diagnosis not present

## 2017-05-13 DIAGNOSIS — I10 Essential (primary) hypertension: Secondary | ICD-10-CM | POA: Diagnosis not present

## 2017-05-13 DIAGNOSIS — K5790 Diverticulosis of intestine, part unspecified, without perforation or abscess without bleeding: Secondary | ICD-10-CM | POA: Diagnosis not present

## 2017-05-13 DIAGNOSIS — Z48812 Encounter for surgical aftercare following surgery on the circulatory system: Secondary | ICD-10-CM | POA: Diagnosis not present

## 2017-05-15 ENCOUNTER — Telehealth (HOSPITAL_COMMUNITY): Payer: Self-pay

## 2017-05-15 NOTE — Telephone Encounter (Signed)
Called to speak with patient and follow up in regards to Cardiac Rehab - Wife of patient answered and stated that he still has another month left of HHPT. Will follow up then.

## 2017-05-16 DIAGNOSIS — H532 Diplopia: Secondary | ICD-10-CM | POA: Diagnosis not present

## 2017-05-16 DIAGNOSIS — D751 Secondary polycythemia: Secondary | ICD-10-CM | POA: Diagnosis not present

## 2017-05-16 DIAGNOSIS — K5790 Diverticulosis of intestine, part unspecified, without perforation or abscess without bleeding: Secondary | ICD-10-CM | POA: Diagnosis not present

## 2017-05-16 DIAGNOSIS — Z951 Presence of aortocoronary bypass graft: Secondary | ICD-10-CM | POA: Diagnosis not present

## 2017-05-16 DIAGNOSIS — I2583 Coronary atherosclerosis due to lipid rich plaque: Secondary | ICD-10-CM | POA: Diagnosis not present

## 2017-05-16 DIAGNOSIS — I10 Essential (primary) hypertension: Secondary | ICD-10-CM | POA: Diagnosis not present

## 2017-05-16 DIAGNOSIS — Z48812 Encounter for surgical aftercare following surgery on the circulatory system: Secondary | ICD-10-CM | POA: Diagnosis not present

## 2017-05-16 DIAGNOSIS — I25110 Atherosclerotic heart disease of native coronary artery with unstable angina pectoris: Secondary | ICD-10-CM | POA: Diagnosis not present

## 2017-05-16 DIAGNOSIS — E785 Hyperlipidemia, unspecified: Secondary | ICD-10-CM | POA: Diagnosis not present

## 2017-05-17 DIAGNOSIS — I2583 Coronary atherosclerosis due to lipid rich plaque: Secondary | ICD-10-CM | POA: Diagnosis not present

## 2017-05-17 DIAGNOSIS — K5790 Diverticulosis of intestine, part unspecified, without perforation or abscess without bleeding: Secondary | ICD-10-CM | POA: Diagnosis not present

## 2017-05-17 DIAGNOSIS — I10 Essential (primary) hypertension: Secondary | ICD-10-CM | POA: Diagnosis not present

## 2017-05-17 DIAGNOSIS — E785 Hyperlipidemia, unspecified: Secondary | ICD-10-CM | POA: Diagnosis not present

## 2017-05-17 DIAGNOSIS — I25110 Atherosclerotic heart disease of native coronary artery with unstable angina pectoris: Secondary | ICD-10-CM | POA: Diagnosis not present

## 2017-05-17 DIAGNOSIS — D751 Secondary polycythemia: Secondary | ICD-10-CM | POA: Diagnosis not present

## 2017-05-17 DIAGNOSIS — H532 Diplopia: Secondary | ICD-10-CM | POA: Diagnosis not present

## 2017-05-17 DIAGNOSIS — Z951 Presence of aortocoronary bypass graft: Secondary | ICD-10-CM | POA: Diagnosis not present

## 2017-05-17 DIAGNOSIS — Z48812 Encounter for surgical aftercare following surgery on the circulatory system: Secondary | ICD-10-CM | POA: Diagnosis not present

## 2017-05-23 ENCOUNTER — Other Ambulatory Visit: Payer: Self-pay | Admitting: Urgent Care

## 2017-05-23 DIAGNOSIS — Z951 Presence of aortocoronary bypass graft: Secondary | ICD-10-CM | POA: Diagnosis not present

## 2017-05-23 DIAGNOSIS — H532 Diplopia: Secondary | ICD-10-CM | POA: Diagnosis not present

## 2017-05-23 DIAGNOSIS — Z48812 Encounter for surgical aftercare following surgery on the circulatory system: Secondary | ICD-10-CM | POA: Diagnosis not present

## 2017-05-23 DIAGNOSIS — I10 Essential (primary) hypertension: Secondary | ICD-10-CM | POA: Diagnosis not present

## 2017-05-23 DIAGNOSIS — K5790 Diverticulosis of intestine, part unspecified, without perforation or abscess without bleeding: Secondary | ICD-10-CM | POA: Diagnosis not present

## 2017-05-23 DIAGNOSIS — E785 Hyperlipidemia, unspecified: Secondary | ICD-10-CM | POA: Diagnosis not present

## 2017-05-23 DIAGNOSIS — I2583 Coronary atherosclerosis due to lipid rich plaque: Secondary | ICD-10-CM | POA: Diagnosis not present

## 2017-05-23 DIAGNOSIS — D751 Secondary polycythemia: Secondary | ICD-10-CM | POA: Diagnosis not present

## 2017-05-23 DIAGNOSIS — I25110 Atherosclerotic heart disease of native coronary artery with unstable angina pectoris: Secondary | ICD-10-CM | POA: Diagnosis not present

## 2017-05-23 NOTE — Telephone Encounter (Signed)
Morey Hummingbird [facility] from [facility] called and wants to know if he can please have this refilled by Saturday   clopidogrel (PLAVIX) 75 MG tablet [Pharmacy Med Name: CLOPIDOGREL 75 MG TABLET] [[id number]]   Call back if needed [phone] Morey Hummingbird)

## 2017-05-23 NOTE — Telephone Encounter (Signed)
Clopidogrel LOV: 06/14/16 PCP: Lydia: [place], [place]

## 2017-05-23 NOTE — Telephone Encounter (Signed)
Patient called, wife answered and was made aware that the patient will need a physical appointment scheduled before refill of Plavix, she verbalized understanding and asks for the first of May due to the patient having several April appointments with cardiologist from his heart surgery.  Scheduled CPE visit 06/20/17 at 1320. I advised the medication will be filled x 30 days until his appointment, she verbalized understanding.

## 2017-05-29 ENCOUNTER — Other Ambulatory Visit: Payer: Medicare HMO | Admitting: *Deleted

## 2017-05-29 ENCOUNTER — Ambulatory Visit (INDEPENDENT_AMBULATORY_CARE_PROVIDER_SITE_OTHER): Payer: Medicare HMO

## 2017-05-29 ENCOUNTER — Other Ambulatory Visit: Payer: Self-pay | Admitting: Cardiothoracic Surgery

## 2017-05-29 VITALS — BP 128/78 | HR 83 | Wt 185.4 lb

## 2017-05-29 DIAGNOSIS — I959 Hypotension, unspecified: Secondary | ICD-10-CM | POA: Diagnosis not present

## 2017-05-29 DIAGNOSIS — Z79899 Other long term (current) drug therapy: Secondary | ICD-10-CM | POA: Diagnosis not present

## 2017-05-29 DIAGNOSIS — Z951 Presence of aortocoronary bypass graft: Secondary | ICD-10-CM

## 2017-05-29 DIAGNOSIS — E785 Hyperlipidemia, unspecified: Secondary | ICD-10-CM | POA: Diagnosis not present

## 2017-05-29 LAB — LIPID PANEL
Chol/HDL Ratio: 2.9 ratio (ref 0.0–5.0)
Cholesterol, Total: 101 mg/dL (ref 100–199)
HDL: 35 mg/dL — AB (ref >39)
LDL Calculated: 49 mg/dL (ref 0–99)
TRIGLYCERIDES: 84 mg/dL (ref 0–149)
VLDL Cholesterol Cal: 17 mg/dL (ref 5–40)

## 2017-05-29 LAB — HEPATIC FUNCTION PANEL
ALBUMIN: 3.4 g/dL — AB (ref 3.5–4.8)
ALT: 18 IU/L (ref 0–44)
AST: 12 IU/L (ref 0–40)
Alkaline Phosphatase: 73 IU/L (ref 39–117)
Bilirubin Total: 0.6 mg/dL (ref 0.0–1.2)
Bilirubin, Direct: 0.22 mg/dL (ref 0.00–0.40)
Total Protein: 7 g/dL (ref 6.0–8.5)

## 2017-05-29 NOTE — Patient Instructions (Addendum)
Medication Instructions:  Continue Medications as planned. Be sure to take your baby aspirin and plavix every day unless otherwise directed  Labwork: You had Fasting Lipids drawn today  Testing/Procedures: None   Follow-Up: You have a follow up appointment scheduled with Dr Burt Knack June 17 at 11:40am  Any Other Special Instructions Will Be Listed Below (If Applicable).     If you need a refill on your cardiac medications before your next appointment, please call your pharmacy.

## 2017-05-29 NOTE — Progress Notes (Signed)
Pt has been made aware of normal result and verbalized understanding.  jw 05/29/17

## 2017-05-29 NOTE — Progress Notes (Signed)
Pt returns today for a RN follow up for [name] and reduction in valsartan to 160mg . Pt states he is feeling well today with no additional complaints. Vital signs are stable; reviewed vitals and medications with [name]. Pt stated he only has been taking his 81mg  ASA about 3 times per week, he thought it was for pain control. We discussed how the ASA is part of his dual platelet therapy and is important for him to take everyday unless otherwise directed.

## 2017-05-30 ENCOUNTER — Telehealth (HOSPITAL_COMMUNITY): Payer: Self-pay

## 2017-05-30 ENCOUNTER — Other Ambulatory Visit: Payer: Self-pay

## 2017-05-30 ENCOUNTER — Encounter: Payer: Self-pay | Admitting: Cardiothoracic Surgery

## 2017-05-30 ENCOUNTER — Ambulatory Visit
Admission: RE | Admit: 2017-05-30 | Discharge: 2017-05-30 | Disposition: A | Discharge status: Home / Self Care | Payer: Medicare HMO | Source: Ambulatory Visit | Attending: Cardiothoracic Surgery | Admitting: Cardiothoracic Surgery

## 2017-05-30 ENCOUNTER — Ambulatory Visit (INDEPENDENT_AMBULATORY_CARE_PROVIDER_SITE_OTHER): Payer: Self-pay | Admitting: Cardiothoracic Surgery

## 2017-05-30 VITALS — BP 107/68 | HR 86 | Temp 97.5°F | Resp 18 | Ht 66.0 in | Wt 184.4 lb

## 2017-05-30 DIAGNOSIS — J90 Pleural effusion, not elsewhere classified: Secondary | ICD-10-CM | POA: Diagnosis not present

## 2017-05-30 DIAGNOSIS — Z951 Presence of aortocoronary bypass graft: Secondary | ICD-10-CM

## 2017-05-30 DIAGNOSIS — I214 Non-ST elevation (NSTEMI) myocardial infarction: Secondary | ICD-10-CM

## 2017-05-30 DIAGNOSIS — I25118 Atherosclerotic heart disease of native coronary artery with other forms of angina pectoris: Secondary | ICD-10-CM

## 2017-05-30 MED ORDER — FUROSEMIDE 20 MG PO TABS: 20.0000 mg | ORAL_TABLET | Freq: Every day | ORAL | 1 refills | Status: DC

## 2017-05-30 MED ORDER — POTASSIUM CHLORIDE CRYS ER 10 MEQ PO TBCR: 10.0000 meq | EXTENDED_RELEASE_TABLET | Freq: Every day | ORAL | 1 refills | Status: DC

## 2017-05-30 NOTE — Telephone Encounter (Signed)
Wife of patient called to schedule patient for CR - Patient has completed HHPT. Scheduled orientation on 08/01/17 at 8:30am. Patient will attend the 2:45pm exc class. Went over insurance with wife and wife verbally stated she understands what patient is responsible for. Mailed packet.

## 2017-05-30 NOTE — Progress Notes (Signed)
[address]       [place] [address]             [phone]      Gurkaran Mareno [place] Medical Record #[id number] Date of Birth: 1940-12-14  Referring: Sherren Mocha, MD Primary Care: Jaynee Eagles, PA-C Primary Cardiologist: No primary care provider on file.   Chief Complaint:   POST OP FOLLOW UP 04/17/2017 OPERATIVE REPORT PREOPERATIVE DIAGNOSIS:  Severe 3-vessel coronary artery disease with high-grade left main obstruction and recent non-ST elevation myocardial infarction. POSTOPERATIVE DIAGNOSIS:  Severe 3-vessel coronary artery disease with high-grade left main obstruction and recent non-ST elevation myocardial infarction. SURGICAL PROCEDURE:  Urgent coronary artery bypass grafting x4 with the left internal mammary to the distal left anterior descending coronary artery, reverse saphenous vein graft to the diagonal coronary artery, reverse saphenous vein graft to the circumflex coronary artery, reverse saphenous vein graft to the mid posterior descending coronary artery with right thigh and calf endo vein harvesting. SURGEON:  Lanelle Bal, MD.   History of Present Illness:      Patient is progressing following recent coronary artery bypass grafting.  He is slowly increasing his physical activity .  He denies recurrent chest pain, does note some pedal edema.  His chest x-ray report suggested possible pneumonia in the left lower lobe, however the patient has had no cough fever chills night sweats or other indication of infectious process.    Past Medical History:  Diagnosis Date  . Blurred vision 05/17/2016  . CAD (coronary artery disease), native coronary artery 06/14/2008   a. BMS to LCx & OM 2008 b. 03/2017 CABG x 4 (LIMA to LAD, SVG to diag 1, SVG to distal Circ, SVG to PDA)  . Chest pain 04/16/2012  . Diplopia   . Essential hypertension 05/17/2016  . Hyperlipidemia with target low density lipoprotein (LDL) cholesterol less than 70  mg/dL 06/14/2008   Qualifier: Diagnosis of  By: Mare Ferrari, RMA, [name]    . Hypertension   . Hypertensive heart disease 06/14/2008   Qualifier: Diagnosis of  By: Mare Ferrari, [place], [name]    . Hypertensive urgency, malignant 04/16/2012  . Internal hemorrhoids 04/19/2012  . Ischemic stroke ([place]) 05/02/2016  . pandiverticulosis 04/22/2012   12/17/2011. [place]. Juanita Craver MD. Colonoscopy. Moderate sized internal hemorrhoids and extensive pandiverticulosis. Repeat 5 years.    . Polycythemia vera(238.4) 04/17/2012  . Syncope and collapse 04/17/2012   Pt syncopized while sitting in bed giving history @ time of admission to hospital Tachycardic (appeared sinus) to 120s-130s and hypotensive to 50s/30s.  Unresponsive initially >> Spontaneously resolved after 2-3 minutes >> return to baseline ~30 minutes   . Vertigo      Social History   Tobacco Use  Smoking Status Never Smoker  Smokeless Tobacco Never Used    Social History   Substance and Sexual Activity  Alcohol Use No  . Alcohol/week: 0.0 oz     Allergies  Allergen Reactions  . Lipitor [Atorvastatin] Shortness Of Breath  . Pork-Derived Products Other (See Comments)    No pork products - unspecified    Current Outpatient Medications  Medication Sig Dispense Refill  . acetaminophen (TYLENOL) 325 MG tablet Take 2 tablets (650 mg total) by mouth every 6 (six) hours as needed for mild pain.    Marland Kitchen aspirin EC 81 MG tablet Take 81 mg by mouth daily.    . carvedilol (COREG) 12.5 MG tablet Take 1 tablet (12.5 mg total) 2 (  two) times daily by mouth. 60 tablet 0  . clopidogrel (PLAVIX) 75 MG tablet TAKE 1 TABLET (75 MG TOTAL) DAILY BY MOUTH. 30 tablet 0  . hydrochlorothiazide (HYDRODIURIL) 25 MG tablet Take 1 tablet (25 mg total) by mouth daily. 90 tablet 2  . rosuvastatin (CRESTOR) 40 MG tablet Take 1 tablet (40 mg total) by mouth daily. Dose change 90 tablet 3  . traMADol (ULTRAM) 50 MG tablet Take 1 tablet (50 mg total) by mouth  every 4 (four) hours as needed for moderate pain. 30 tablet 0  . valsartan (DIOVAN) 160 MG tablet Take 1 tablet (160 mg total) by mouth daily. Dose change 90 tablet 3  . benzonatate (TESSALON) 100 MG capsule Take 1 capsule (100 mg total) by mouth 3 (three) times daily as needed for cough. (Patient not taking: Reported on 05/29/2017) 20 capsule 0   No current facility-administered medications for this visit.        Physical Exam: BP 107/68 (BP Location: Right Arm, Patient Position: Sitting, Cuff Size: Large)   Pulse 86   Temp (!) 97.5 F (36.4 C)   Resp 18   Ht 5\' 6"  (1.676 m)   Wt 184 lb 6.4 oz (83.6 kg)   SpO2 96% Comment: ON RA  BMI 29.76 kg/m   General appearance: alert, cooperative and appears older than stated age Neurologic: intact Heart: regular rate and rhythm, S1, S2 normal, no murmur, click, rub or gallop Lungs: diminished breath sounds LLL Abdomen: soft, non-tender; bowel sounds normal; no masses,  no organomegaly Extremities: extremities normal, atraumatic, no cyanosis, mild pedal edema right leg slightly greater than the left  Wound: Sternum is stable and well-healed vein harvest site right lower leg is healing well   Diagnostic Studies & Laboratory data:     Recent Radiology Findings:   Dg Chest 2 View  Result Date: 05/30/2017 CLINICAL DATA:  Recent coronary artery bypass grafting. Hypertension. EXAM: CHEST - 2 VIEW COMPARISON:  April 23, 2017 FINDINGS: There is a small left pleural effusion with consolidation in the left lower lobe. Right lung is clear. Heart is mildly enlarged with pulmonary vascularity within normal limits. No adenopathy. Patient is status post coronary artery bypass grafting. There is aortic atherosclerosis. There is degenerative change in the thoracic spine. IMPRESSION: Small left pleural effusion with consolidation left lower lobe, felt to represent pneumonia. Lungs elsewhere clear. Stable cardiac prominence. There is aortic atherosclerosis. No  adenopathy. Aortic Atherosclerosis (ICD10-I70.0). These results will be called to the ordering clinician or representative by the Radiologist Assistant, and communication documented in the PACS or zVision Dashboard. Electronically Signed   By: Lowella Grip III M.D.   On: 05/30/2017 10:59   I have independently reviewed the above radiology studies  and reviewed the findings with the patient.    Recent Lab Findings: Lab Results  Component Value Date   WBC 13.7 (H) 05/01/2017   HGB 10.6 (L) 05/01/2017   HCT 31.6 (L) 05/01/2017   PLT 453 (H) 05/01/2017   GLUCOSE 147 (H) 04/23/2017   CHOL 101 05/29/2017   TRIG 84 05/29/2017   HDL 35 (L) 05/29/2017   LDLDIRECT 150.8 06/30/2008   LDLCALC 49 05/29/2017   ALT 18 05/29/2017   AST 12 05/29/2017   NA 134 (L) 04/23/2017   K 4.1 04/23/2017   CL 99 (L) 04/23/2017   CREATININE 1.04 04/23/2017   BUN 15 04/23/2017   CO2 26 04/23/2017   TSH 4.888 (H) 04/17/2012   INR 1.55 04/17/2017  HGBA1C 5.9 (H) 04/15/2017      Assessment / Plan:   Patient making reasonable progress following recent coronary artery bypass grafting, with his mild left pleural effusion and mild pedal edema I started him on a 14-day course of Lasix 20 mg a day and potassium 10 mEq a day We will plan to see him back in 3-4 weeks with a follow-up chest x-ray Is been referred to cardiac rehab       [name] MD      [address] [place] [address] Office [phone]   Beeper [phone]  05/30/2017 11:42 AM

## 2017-05-31 DIAGNOSIS — I2583 Coronary atherosclerosis due to lipid rich plaque: Secondary | ICD-10-CM | POA: Diagnosis not present

## 2017-05-31 DIAGNOSIS — D751 Secondary polycythemia: Secondary | ICD-10-CM | POA: Diagnosis not present

## 2017-05-31 DIAGNOSIS — Z48812 Encounter for surgical aftercare following surgery on the circulatory system: Secondary | ICD-10-CM | POA: Diagnosis not present

## 2017-05-31 DIAGNOSIS — I10 Essential (primary) hypertension: Secondary | ICD-10-CM | POA: Diagnosis not present

## 2017-05-31 DIAGNOSIS — K5790 Diverticulosis of intestine, part unspecified, without perforation or abscess without bleeding: Secondary | ICD-10-CM | POA: Diagnosis not present

## 2017-05-31 DIAGNOSIS — H532 Diplopia: Secondary | ICD-10-CM | POA: Diagnosis not present

## 2017-05-31 DIAGNOSIS — E785 Hyperlipidemia, unspecified: Secondary | ICD-10-CM | POA: Diagnosis not present

## 2017-05-31 DIAGNOSIS — Z951 Presence of aortocoronary bypass graft: Secondary | ICD-10-CM | POA: Diagnosis not present

## 2017-05-31 DIAGNOSIS — I25110 Atherosclerotic heart disease of native coronary artery with unstable angina pectoris: Secondary | ICD-10-CM | POA: Diagnosis not present

## 2017-06-06 DIAGNOSIS — K5790 Diverticulosis of intestine, part unspecified, without perforation or abscess without bleeding: Secondary | ICD-10-CM | POA: Diagnosis not present

## 2017-06-06 DIAGNOSIS — I10 Essential (primary) hypertension: Secondary | ICD-10-CM | POA: Diagnosis not present

## 2017-06-06 DIAGNOSIS — Z48812 Encounter for surgical aftercare following surgery on the circulatory system: Secondary | ICD-10-CM | POA: Diagnosis not present

## 2017-06-06 DIAGNOSIS — H532 Diplopia: Secondary | ICD-10-CM | POA: Diagnosis not present

## 2017-06-06 DIAGNOSIS — I25110 Atherosclerotic heart disease of native coronary artery with unstable angina pectoris: Secondary | ICD-10-CM | POA: Diagnosis not present

## 2017-06-06 DIAGNOSIS — I2583 Coronary atherosclerosis due to lipid rich plaque: Secondary | ICD-10-CM | POA: Diagnosis not present

## 2017-06-06 DIAGNOSIS — E785 Hyperlipidemia, unspecified: Secondary | ICD-10-CM | POA: Diagnosis not present

## 2017-06-06 DIAGNOSIS — Z951 Presence of aortocoronary bypass graft: Secondary | ICD-10-CM | POA: Diagnosis not present

## 2017-06-06 DIAGNOSIS — D751 Secondary polycythemia: Secondary | ICD-10-CM | POA: Diagnosis not present

## 2017-06-11 ENCOUNTER — Ambulatory Visit (INDEPENDENT_AMBULATORY_CARE_PROVIDER_SITE_OTHER): Payer: Medicare HMO

## 2017-06-11 ENCOUNTER — Ambulatory Visit (INDEPENDENT_AMBULATORY_CARE_PROVIDER_SITE_OTHER): Payer: Medicare HMO | Admitting: Urgent Care

## 2017-06-11 ENCOUNTER — Encounter: Payer: Self-pay | Admitting: Urgent Care

## 2017-06-11 VITALS — BP 122/72 | HR 87 | Temp 98.1°F | Resp 17 | Ht 66.0 in | Wt 183.0 lb

## 2017-06-11 DIAGNOSIS — J189 Pneumonia, unspecified organism: Secondary | ICD-10-CM

## 2017-06-11 DIAGNOSIS — J90 Pleural effusion, not elsewhere classified: Secondary | ICD-10-CM

## 2017-06-11 DIAGNOSIS — I214 Non-ST elevation (NSTEMI) myocardial infarction: Secondary | ICD-10-CM | POA: Diagnosis not present

## 2017-06-11 DIAGNOSIS — R9389 Abnormal findings on diagnostic imaging of other specified body structures: Secondary | ICD-10-CM | POA: Diagnosis not present

## 2017-06-11 DIAGNOSIS — I2583 Coronary atherosclerosis due to lipid rich plaque: Secondary | ICD-10-CM | POA: Diagnosis not present

## 2017-06-11 DIAGNOSIS — E785 Hyperlipidemia, unspecified: Secondary | ICD-10-CM

## 2017-06-11 DIAGNOSIS — J181 Lobar pneumonia, unspecified organism: Secondary | ICD-10-CM | POA: Diagnosis not present

## 2017-06-11 DIAGNOSIS — I10 Essential (primary) hypertension: Secondary | ICD-10-CM

## 2017-06-11 DIAGNOSIS — I2510 Atherosclerotic heart disease of native coronary artery without angina pectoris: Secondary | ICD-10-CM

## 2017-06-11 DIAGNOSIS — Z951 Presence of aortocoronary bypass graft: Secondary | ICD-10-CM

## 2017-06-11 LAB — BASIC METABOLIC PANEL
BUN / CREAT RATIO: 15 (ref 10–24)
BUN: 14 mg/dL (ref 8–27)
CHLORIDE: 100 mmol/L (ref 96–106)
CO2: 26 mmol/L (ref 20–29)
Calcium: 9.5 mg/dL (ref 8.6–10.2)
Creatinine, Ser: 0.91 mg/dL (ref 0.76–1.27)
GFR calc Af Amer: 95 mL/min/{1.73_m2} (ref >59)
GFR calc non Af Amer: 82 mL/min/{1.73_m2} (ref >59)
GLUCOSE: 120 mg/dL — AB (ref 65–99)
POTASSIUM: 3.8 mmol/L (ref 3.5–5.2)
SODIUM: 140 mmol/L (ref 134–144)

## 2017-06-11 MED ORDER — LEVOFLOXACIN 750 MG PO TABS: 750.0000 mg | ORAL_TABLET | Freq: Every day | ORAL | 0 refills | Status: DC

## 2017-06-11 NOTE — Patient Instructions (Addendum)
Neumona extrahospitalaria en los adultos Community-Acquired Pneumonia, Adult La neumona es una infeccin en los pulmones. Hay diferentes tipos de neumona. Uno de ellos se desarrolla cuando una persona est en un hospital. Otro, llamado neumona extrahospitalaria, se desarrolla en personas que no estn, ni han Armed forces operational officer, en un hospital u otro centro de atencin de la salud. Cules son las causas? Bacterias, virus u hongos podran ser la causa de la neumona. Con frecuencia, la neumona extrahospitalaria es provocada por la bacteria Streptococcus pneumoniae. Estas bacterias suelen contagiarse de [place] persona a otra al respirar gotitas de la tos o de un estornudo de una persona infectada. Qu incrementa el riesgo? Es ms probable que Orthoptist en las siguientes poblaciones:  Personas con enfermedades crnicas, como enfermedad pulmonar obstructiva crnica(EPOC), asma, insuficiencia cardaca congestiva, fibrosis qustica, diabetes o enfermedad renal.  Personas con fase temprana o avanzada de VIH.  Personas con anemia drepanoctica.  Personas a quienes les han extrado el bazo(esplenectoma).  Personas cuya higiene dental es deficiente.  Personas con afecciones que incrementan el riesgo de respirar(aspirar) secreciones por la boca o la nariz.  Personas con el sistema inmunitario debilitado(inmunocomprometidas).  Fumadores.  Personas que viajan a lugares donde frecuentemente hay microbios que causan neumona.  Personas que suelen estar alrededor de animales o de hbitats de animales que tienen microbios que causan neumona; por ejemplo, pjaros, murcilagos, conejos, gatos y animales de [place].  Cules son los signos o los sntomas? Los sntomas de esta afeccin incluyen lo siguiente:  Tos seca.  Tos hmeda (productiva).  Cristy Hilts.  Sudoracin.  Dolor en el pecho; en especial, al respirar profundamente o toser.  Respiracin rpida o dificultad  para respirar.  Falta de aire.  Escalofros.  Fatiga.  Dolores musculares.  Cmo se diagnostica? El mdico le har una historia clnica y un examen fsico. Tambin pueden hacerle otros estudios, por ejemplo:  Estudios de diagnstico por imgenes del pecho, incluidas radiografas.  Pruebas para controlar el nivel de oxgeno en la sangre y otros gases sanguneos.  Otras pruebas de la sangre, la mucosidad(esputo), el lquido que rodea los pulmones(lquido pleural) y Higher education careers adviser.  Si la neumona es grave, se podran realizar otros estudios para identificar la causa especfica de la enfermedad. Cmo se trata esta afeccin? El tipo de tratamiento que reciba depender de muchos factores; por ejemplo, la causa de la neumona, los medicamentos que toma y otras afecciones que tenga. En la [employer], el tratamiento contra la neumona y la recuperacin despus de esta podran Press photographer. En algunos casos, el tratamiento debe realizarse en un hospital. El tratamiento puede incluir lo siguiente:  Antibiticos, si la neumona fue provocada por una bacteria.  Medicamentos antivirales, si la neumona fue provocada por un virus.  Medicamentos que se administran por boca o a travs de un tubo (catter) intravenoso.  Oxgeno.  Terapia respiratoria.  A pesar de que es infrecuente, el tratamiento contra la neumona grave podra incluir lo siguiente:  Asistencia respiratoria mecnica. Se realiza si no respira bien por sus propios medios y, por lo tanto, no puede mantener un nivel seguro de oxgeno Patent attorney.  Toracocentesis. A travs de este procedimiento, se extrae el lquido que rodea uno o ambos pulmones para ayudarlo a Fish farm manager.  Siga estas indicaciones en su casa:  Tome los medicamentos de venta libre y los recetados solamente como se lo haya indicado el mdico. ? Solo tome medicamentos para la tos si no puede dormir bien.  Debe tener en cuenta que los  medicamentos para la tos pueden hacer que el cuerpo no sea capaz de Radiographer, therapeutic, de forma natural, la mucosidad de los pulmones. ? Si le recetaron un antibitico, tmelo como se lo haya indicado el mdico. No deje de tomar los antibiticos aunque comience a Sports administrator.  De noche, duerma semisentado. Intente dormir en un silln reclinable o pngase algunas almohadas debajo de la cabeza.  No consuma productos que contengan tabaco, incluidos cigarrillos, tabaco de Higher education careers adviser y Psychologist, sport and exercise. Si necesita ayuda para dejar de fumar, consulte al mdico.  Beba suficiente agua para mantener la orina clara o de color amarillo plido. Si lo hace, ayudar a diluir la secrecin de mucosidad en los pulmones. Cmo se previene? De varias formas puede disminuir el riesgo de contraer neumona extrahospitalaria. Considere colocarse la Copy en los siguientes casos:  Es mayor de [phone].  Es mayor de 21aos y est en tratamiento para Science writer, tiene una enfermedad pulmonar crnica o tiene otras afecciones que perjudican el sistema inmunitario. Pregntele al mdico si esto es vlido para su caso.  Hay diferentes tipos y esquemas de vacunas antineumoccicas. Pregntele al mdico cul es la vacuna ms [place] para usted. Tambin puede evitar contraer neumona extrahospitalaria si toma estas medidas:  Colquese la vacuna contra la gripe todos los [place]. Pregntele al mdico cul es la vacuna contra la gripe ms adecuada para usted.  Visite al dentista con regularidad.  Lvese las manos con frecuencia. Utilice un desinfectante para manos si no dispone de [place] y [place].  Comunquese con un mdico si:  Tiene fiebre.  No puede dormir porque no es capaz de Aeronautical engineer tos con medicamentos. Solicite ayuda de inmediato si:  Experimenta un empeoramiento en la falta de aire.  El dolor de pecho es cada vez ms intenso.  La enfermedad empeora, especialmente si usted es un adulto mayor o  su sistema inmunitario est debilitado.  Tose y escupe sangre. Esta informacin no tiene Marine scientist el consejo del mdico. Asegrese de hacerle al mdico cualquier pregunta que tenga. Document Released: 11/15/2004 Document Revised: 05/09/2016 Document Reviewed: 06/02/2014 Elsevier Interactive Patient Education  2018 [place] en hombres Coronary Artery Disease, Male La arteriopata coronaria es una afeccin por la cual las arterias que llegan al corazn (arterias coronarias) se Investment banker, corporate u obstruyen. El Public librarian u obstruccin puede conducir a una disminucin del flujo sanguneo al corazn. La reduccin prolongada del flujo sanguneo puede causar un ataque cardaco (infarto de miocardio o IM). Esta afeccin tambin es conocida como cardiopata coronaria. Debido a que la arteriopata coronaria es la causa principal de muerte en los hombres, es importante entender lo que causa este trastorno y el modo de tratarlo. Cules son las causas? La arteriopata coronaria a menudo est causada por aterosclerosis. Es Software engineer, la acumulacin de grasa y colesterol (placa) en la parte interna de las arterias. Con el paso del [place], la placa puede Psychiatric nurse u obstruir la arteria, y de Development worker, international aid reducir el flujo sanguneo hacia el corazn. La placa tambin puede debilitarse y desprenderse dentro de una arteria coronaria y causar una obstruccin repentina. Otras causas menos frecuentes de arteriopata coronaria incluyen lo siguiente:  Una embolia o cogulo sanguneo en una arteria coronaria.  Una ruptura de la arteria (diseccin espontnea de arterias coronarias).  Una aneurisma.  Inflamacin (vasculitis) en la pared de la arteria.  Qu incrementa el riesgo? Los siguientes factores pueden hacer que usted sea ms  propenso a tener esta enfermedad:  La edad. Los [employer] de 47aos corren ms riesgo de sufrir arteriopata coronaria.  Antecedentes  familiares de arteriopata coronaria.  El sexo. A menudo, los hombres presentan arteriopata coronaria antes que las mujeres.  Presin arterial elevada (hipertensin arterial).  Diabetes.  Niveles elevados de colesterol.  Consumo de tabaco.  Consumo excesivo de bebidas alcohlicas.  La falta de actividad fsica.  Una dieta con alto contenido de grasas trans y saturadas, como alimentos fritos y carne procesada.  Entre otros factores de riesgo posibles se incluyen los siguientes:  Niveles elevados de estrs.  Depresin.  Obesidad.  Apnea del sueo  Cules son los signos o los sntomas? Muchas personas no presentan sntomas durante las etapas iniciales de la arteriopata coronaria. A medida que la enfermedad avanza, los sntomas pueden incluir lo siguiente:  Tourist information centre manager (angina). El dolor: ? Puede ser un dolor contundente u opresivo, o como una sensacin de opresin, presin, distensin o Production designer, theatre/television/film. ? Puede durar algunos minutos o puede detenerse y Stage manager. Tiende a agudizarse con el ejercicio o el estrs y desaparece con el reposo.  Dolor en los brazos, el cuello, la mandbula o la espalda.  Acidez estomacal o empacho sin causa aparente.  Falta de aire.  Nuseas o vmitos.  Sensacin repentina de desvanecimiento.  Sudor fro repentino.  Latidos cardacos rpidos o como un aleteo (palpitaciones).  Cmo se diagnostica? Esta afeccin se diagnostica en funcin de lo siguiente:  Sus antecedentes mdicos y familiares.  Un examen fsico.  Otros estudios que pueden realizarle son los siguientes: ? Un estudio para Chief Technology Officer las seales elctricas en el corazn (electrocardiograma). ? Ergometra. Busca signos de obstruccin cuando el corazn est realizando mucho esfuerzo debido al ejercicio, como correr o Lawyer cinta. ? Systems analyst. Se utiliza para detectar signos de obstruccin cuando el corazn se somete a IT consultant  uso de un medicamento. ? Anlisis de [place]. ? Angiografa coronaria. En este procedimiento se detecta si hay obstruccin en las arterias coronarias. Durante este estudio, se inyecta un tinte de contraste en las arterias para que se vean en la radiografa. ? Un estudio que toma una imagen del corazn mediante el uso de ondas sonoras (ecocardiograma). ? Radiografa de trax.  Cmo se trata? El Chief Operating Officer en lo siguiente:  Hacer modificaciones saludables en el estilo de vida para reducir los factores de [place].  Medicamentos como: ? Medicamentos antiplaquetarios y Environmental manager, como la aspirina. Estos ayudan a Mining engineer formacin de cogulos sanguneos. ? Nitroglicerina. ? Medicamentos para la presin arterial. ? Medicamentos para Advertising copywriter.  Angioplastia coronaria y colocacin de stents. Durante este procedimiento, se inserta un tubo delgado y flexible a travs de un vaso sanguneo hasta una arteria obstruida. En el extremo del tubo se infla un baln o dispositivo similar para abrir la arteria. En algunos casos, se inserta un tubo pequeo de malla (stent) dentro de la arteria para mantenerla abierta.  Ciruga de revascularizacin arterial coronaria. Durante esta ciruga, se usan venas y arterias de otras partes del cuerpo para crear un desvo (bypass) alrededor de la obstruccin y permitir que la sangre llegue al corazn.  Siga estas instrucciones en su casa: Medicamentos  Tome los medicamentos de venta libre y los recetados solamente como se lo haya indicado el mdico.  No tome los siguientes medicamentos a menos que el mdico lo autorice: ? Antiinflamatorios no esteroides (AINE), como ibuprofeno, naproxeno o celecoxib. ? Suplementos  vitamnicos que contienen vitamina A, vitamina E o ambas. Estilo de vida  Siga un programa de actividad fsica aprobado por el mdico. Trate de hacer 150 minutos de actividad fsica moderada o 75 minutos de actividad fsica  intensa por semana.  Mantenga un peso saludable o pierda peso segn se lo haya indicado su mdico.  Descanse cuando se sienta cansado.  Aprenda a manejar el estrs o trate de limitarlo. Pida ayuda al mdico si es necesario.  Hgase estudios para Hydrographic surveyor depresin y busque tratamiento, si fuera necesario.  No consuma ningn producto que contenga nicotina o tabaco, como cigarrillos y Psychologist, sport and exercise. Si necesita ayuda para dejar de fumar, consulte al mdico.  No consuma drogas. Comida y bebida  Siga una dieta cardiosaludable. Un nutricionista puede ensearle sobre opciones de alimentos saludables y los cambios correspondientes. En general, ingiera muchas frutas y verduras, carnes magras y cereales integrales.  Evite los alimentos con alto contenido de: ? Azcar. ? Sal (sodio). ? Grasa saturada, como carnes procesadas o con grasa. ? Grasa trans, como alimentos fritos.  Use mtodos de coccin saludables, como asar, Interior and spatial designer, hervir, hornear, cocer al vapor o IT consultant.  Si bebe alcohol y su mdico lo aprueba, limite la ingesta a no ms de 2 medidas por Training and development officer. [employer] a 12 onzas de cerveza, 5onzas de vino o 1onzas de bebidas alcohlicas de alta graduacin. Instrucciones generales  Controle cualquier otra afeccin de la salud, como hipertensin y diabetes. Estas enfermedades afectan su corazn.  El mdico puede pedirle que controle su presin arterial. En condiciones ideales, la presin arterial debe estar por debajo de 130/80.  Concurra a todas las visitas de control como se lo haya indicado el mdico. Esto es importante. Solicite ayuda de inmediato si:  [employer], el cuello, el brazo, la [place], el estmago o la espalda que: ? Dura ms de unos minutos. ? Es recurrente. ? No se alivia despus de tomar medicamentos sublinguales (nitroglicerinasublingual).  Tiene demasiada sudoracin (profusa) sin causa aparente.  Tiene estos sntomas sin causa  aparente: ? Merchant navy officer o empacho. ? Falta de aire o dificultades respiratorias. ? Latidos cardacos rpidos o como un aleteo (palpitaciones). ? Nuseas o vmitos. ? Fatiga. ? Nerviosismo o ansiedad. ? Debilidad. ? Diarrea.  Mareos o sensacin de desvanecimiento repentinos.  Se desmaya.  Siente que quiere hacerse dao o piensa en quitarse la vida. Estos sntomas pueden representar un problema grave que constituye Engineer, maintenance (IT). No espere hasta que los sntomas desaparezcan. Solicite atencin mdica de inmediato. Comunquese con el servicio de emergencias de su localidad (911 en los [place]). No conduzca por sus propios medios [employer] hospital. Resumen  La arteriopata coronaria es un proceso por el cual las arterias que llegan al corazn (arterias coronarias) se Investment banker, corporate u obstruyen. El Public librarian u obstruccin puede conducir a un infarto de miocardio.  Muchas personas no presentan sntomas durante las etapas iniciales de la arteriopata coronaria. Esto se llama "arteriopata coronaria silenciosa".  La arteriopata coronaria se puede tratar con cambios en el estilo de vida, medicamentos, [place] o una combinacin de stos. Esta informacin no tiene Marine scientist el consejo del mdico. Asegrese de hacerle al mdico cualquier pregunta que tenga. Document Released: 09/02/2013 Document Revised: 06/07/2016 Document Reviewed: 06/09/2013 Elsevier Interactive Patient Education  2018 [employer].     IF you received an x-ray today, you will receive an invoice from [facility]. Please contact [facility] at [phone] with questions or concerns regarding your invoice.  IF you received labwork today, you will receive an invoice from [facility]. Please contact [facility] at [phone] with questions or concerns regarding your invoice.   Our billing staff will not be able to assist you with questions regarding bills from these companies.  You will  be contacted with the lab results as soon as they are available. The fastest way to get your results is to activate your My Chart account. Instructions are located on the last page of this paperwork. If you have not heard from [place] regarding the results in 2 weeks, please contact this office.

## 2017-06-11 NOTE — Progress Notes (Signed)
MRN: [id number] DOB: [birth year]  Subjective:   [name] is a 77 y.o. male presenting for follow up on NSTEMI, CAD.  Patient had CABG at the end of April 2019.  He has been back to see his cardiologist Dr. Servando Snare.  His last office visit on May 30, 2017 patient was found to still have a small left pleural effusion with consolidation of left lower lobe thought to represent pneumonia.  However patient states that his cardiologist preferred watchful monitoring as he did not suspect this was pneumonia.  Plan was to repeat his x-ray in early May.  Due to the concurrent pedal edema patient was started on 2-week course of Lasix at 20 mg together with a potassium supplement of 10 mEq.  Today, patient reports that he feels really good.  Denies chest pain, fever, cough, shortness of breath, wheezing.  [name] has a current medication list which includes the following prescription(s): acetaminophen, aspirin ec, benzonatate, carvedilol, clopidogrel, furosemide, hydrochlorothiazide, potassium chloride, rosuvastatin, tramadol, and valsartan. Also is allergic to lipitor [atorvastatin] and pork-derived products.  [name]  has a past medical history of Blurred vision (05/17/2016), CAD (coronary artery disease), native coronary artery (06/14/2008), Chest pain (04/16/2012), Diplopia, Essential hypertension (05/17/2016), Hyperlipidemia with target low density lipoprotein (LDL) cholesterol less than 70 mg/dL (06/14/2008), Hypertension, Hypertensive heart disease (06/14/2008), Hypertensive urgency, malignant (04/16/2012), Internal hemorrhoids (04/19/2012), Ischemic stroke ([place]) (05/02/2016), pandiverticulosis (04/22/2012), Polycythemia vera(238.4) (04/17/2012), Syncope and collapse (04/17/2012), and Vertigo. Also  has a past surgical history that includes Coronary stent placement; LEFT HEART CATH AND CORONARY ANGIOGRAPHY (N/A, 04/16/2017); Coronary artery bypass graft (N/A, 04/17/2017); and TEE without cardioversion (N/A,  04/17/2017).  Objective:   Vitals: BP 122/72   Pulse 87   Temp 98.1 F (36.7 C) (Oral)   Resp 17   Ht 5\' 6"  (1.676 m)   Wt 183 lb (83 kg)   SpO2 98%   BMI 29.54 kg/m   Physical Exam  Constitutional: He is oriented to person, place, and time. He appears well-developed and well-nourished.  Cardiovascular: Normal rate, regular rhythm and intact distal pulses. Exam reveals no gallop and no friction rub.  No murmur heard. Pulmonary/Chest: No respiratory distress. He has no wheezes. He has no rales. He exhibits no tenderness.  Coarse lung sounds over right mid to lower lung base.    Neurological: He is alert and oriented to person, place, and time.  Skin: Skin is warm and dry.  Psychiatric: He has a normal mood and affect.   Dg Chest 2 View  Result Date: 06/11/2017 CLINICAL DATA:  Abnormal lung sounds over the right mid to lower chest. History of left-sided pleural effusion on recent chest x-ray. EXAM: CHEST - 2 VIEW COMPARISON:  PA and lateral chest x-ray of May 30, 2017 FINDINGS: The right lung is adequately inflated. There are mildly increased interstitial markings present in the infrahilar region on the right. On the left there is a persistent small pleural effusion layering posteriorly and laterally. The heart and pulmonary vascularity are normal. There are post CABG changes. There is calcification in the wall of the aortic arch. The bony thorax exhibits no acute abnormality. IMPRESSION: Probable subsegmental atelectasis or early pneumonia in the right infrahilar region. Small left pleural effusion, stable. Followup PA and lateral chest X-ray is recommended in 3-4 weeks following trial of antibiotic therapy to ensure resolution and exclude underlying malignancy. No pulmonary edema.  Previous CABG. Thoracic aortic atherosclerosis. Electronically Signed   By: David  Martinique M.D.   On:  06/11/2017 11:34     Assessment and Plan :   Pneumonia of right middle lobe due to infectious  organism [facility])  Essential hypertension - Plan: Basic metabolic panel  Coronary artery disease due to lipid rich plaque - Plan: Basic metabolic panel  Hyperlipidemia with target low density lipoprotein (LDL) cholesterol less than 70 mg/dL - Plan: Basic metabolic panel  Non-ST elevation (NSTEMI) myocardial infarction (HCC)  S/P CABG x 4  Abnormal chest x-ray - Plan: DG Chest 2 View  Pleural effusion - Plan: DG Chest 2 View  Patient is stable, largely asymptomatic.  I will manage him with Levaquin 750 mg once daily for 1 week.  Counseled patient on ER and return to clinic precautions.  He is to maintain all his other medications including his Lasix and potassium supplement.  Labs pending today.  Jaynee Eagles, PA-C [facility] [phone] 06/11/2017 10:27 AM

## 2017-06-12 DIAGNOSIS — I25110 Atherosclerotic heart disease of native coronary artery with unstable angina pectoris: Secondary | ICD-10-CM | POA: Diagnosis not present

## 2017-06-12 DIAGNOSIS — Z951 Presence of aortocoronary bypass graft: Secondary | ICD-10-CM | POA: Diagnosis not present

## 2017-06-12 DIAGNOSIS — I10 Essential (primary) hypertension: Secondary | ICD-10-CM | POA: Diagnosis not present

## 2017-06-12 DIAGNOSIS — K5790 Diverticulosis of intestine, part unspecified, without perforation or abscess without bleeding: Secondary | ICD-10-CM | POA: Diagnosis not present

## 2017-06-12 DIAGNOSIS — E785 Hyperlipidemia, unspecified: Secondary | ICD-10-CM | POA: Diagnosis not present

## 2017-06-12 DIAGNOSIS — H532 Diplopia: Secondary | ICD-10-CM | POA: Diagnosis not present

## 2017-06-12 DIAGNOSIS — D751 Secondary polycythemia: Secondary | ICD-10-CM | POA: Diagnosis not present

## 2017-06-12 DIAGNOSIS — I2583 Coronary atherosclerosis due to lipid rich plaque: Secondary | ICD-10-CM | POA: Diagnosis not present

## 2017-06-12 DIAGNOSIS — Z48812 Encounter for surgical aftercare following surgery on the circulatory system: Secondary | ICD-10-CM | POA: Diagnosis not present

## 2017-06-18 ENCOUNTER — Encounter: Payer: Self-pay | Admitting: Urgent Care

## 2017-06-18 ENCOUNTER — Ambulatory Visit: Payer: Medicare HMO | Admitting: Urgent Care

## 2017-06-18 ENCOUNTER — Ambulatory Visit (INDEPENDENT_AMBULATORY_CARE_PROVIDER_SITE_OTHER): Payer: Medicare HMO

## 2017-06-18 VITALS — BP 138/70 | HR 95 | Temp 97.3°F | Resp 18 | Ht 66.0 in | Wt 184.8 lb

## 2017-06-18 DIAGNOSIS — R9389 Abnormal findings on diagnostic imaging of other specified body structures: Secondary | ICD-10-CM

## 2017-06-18 DIAGNOSIS — I2583 Coronary atherosclerosis due to lipid rich plaque: Secondary | ICD-10-CM | POA: Diagnosis not present

## 2017-06-18 DIAGNOSIS — I10 Essential (primary) hypertension: Secondary | ICD-10-CM | POA: Diagnosis not present

## 2017-06-18 DIAGNOSIS — J189 Pneumonia, unspecified organism: Secondary | ICD-10-CM

## 2017-06-18 DIAGNOSIS — Z951 Presence of aortocoronary bypass graft: Secondary | ICD-10-CM

## 2017-06-18 DIAGNOSIS — J181 Lobar pneumonia, unspecified organism: Secondary | ICD-10-CM | POA: Diagnosis not present

## 2017-06-18 DIAGNOSIS — J90 Pleural effusion, not elsewhere classified: Secondary | ICD-10-CM | POA: Diagnosis not present

## 2017-06-18 DIAGNOSIS — I2510 Atherosclerotic heart disease of native coronary artery without angina pectoris: Secondary | ICD-10-CM | POA: Diagnosis not present

## 2017-06-18 DIAGNOSIS — J9811 Atelectasis: Secondary | ICD-10-CM | POA: Diagnosis not present

## 2017-06-18 NOTE — Patient Instructions (Addendum)
Por favor regrese en 3 semanas para ver como sigue de sus pulmones.    IF you received an x-ray today, you will receive an invoice from [facility]. Please contact [facility] at [phone] with questions or concerns regarding your invoice.   IF you received labwork today, you will receive an invoice from [facility]. Please contact [facility] at [phone] with questions or concerns regarding your invoice.   Our billing staff will not be able to assist you with questions regarding bills from these companies.  You will be contacted with the lab results as soon as they are available. The fastest way to get your results is to activate your My Chart account. Instructions are located on the last page of this paperwork. If you have not heard from [place] regarding the results in 2 weeks, please contact this office.

## 2017-06-18 NOTE — Progress Notes (Signed)
    MRN: [id number] DOB: [birth year]  Subjective:   [name] is a 77 y.o. male presenting for follow up on pneumonia, pleural effusion.  At his last office visit on 06/11/2017, patient was started on Levaquin.  He has completed this antibiotic course.  Today, patient reports that he is doing very well.  Denies fever, chest pain, shortness of breath, wheezing, productive cough.  He has maintain his medications for his recent heart surgery, CABG x4.  [name] has a current medication list which includes the following prescription(s): acetaminophen, aspirin ec, carvedilol, clopidogrel, furosemide, hydrochlorothiazide, rosuvastatin, valsartan, and potassium chloride. Also is allergic to lipitor [atorvastatin] and pork-derived products.  [name]  has a past medical history of Blurred vision (05/17/2016), CAD (coronary artery disease), native coronary artery (06/14/2008), Chest pain (04/16/2012), Diplopia, Essential hypertension (05/17/2016), Hyperlipidemia with target low density lipoprotein (LDL) cholesterol less than 70 mg/dL (06/14/2008), Hypertension, Hypertensive heart disease (06/14/2008), Hypertensive urgency, malignant (04/16/2012), Internal hemorrhoids (04/19/2012), Ischemic stroke ([place]) (05/02/2016), pandiverticulosis (04/22/2012), Polycythemia vera(238.4) (04/17/2012), Syncope and collapse (04/17/2012), and Vertigo. Also  has a past surgical history that includes Coronary stent placement; LEFT HEART CATH AND CORONARY ANGIOGRAPHY (N/A, 04/16/2017); Coronary artery bypass graft (N/A, 04/17/2017); and TEE without cardioversion (N/A, 04/17/2017).  Objective:   Vitals: BP 138/70   Pulse 95   Temp (!) 97.3 F (36.3 C) (Oral)   Resp 18   Ht 5\' 6"  (1.676 m)   Wt 184 lb 12.8 oz (83.8 kg)   SpO2 97%   BMI 29.83 kg/m   Physical Exam  Constitutional: He is oriented to person, place, and time. He appears well-developed and well-nourished.  HENT:  Mouth/Throat: Oropharynx is clear and moist.  Cardiovascular:  Normal rate, regular rhythm and intact distal pulses. Exam reveals no gallop and no friction rub.  No murmur heard. Pulmonary/Chest: No respiratory distress. He has no wheezes. He has no rales.  Coarse lung sounds over mid to lower lung fields over left side.  Neurological: He is alert and oriented to person, place, and time.  Skin: Skin is warm and dry.  Psychiatric: He has a normal mood and affect.   Dg Chest 2 View  Result Date: 06/18/2017 CLINICAL DATA:  History of pneumonia EXAM: CHEST - 2 VIEW COMPARISON:  06/11/2017 FINDINGS: Left basilar density noted, improved since prior study, likely residual atelectasis. Right lung clear. Heart is normal size. Prior CABG. No acute bony abnormality. IMPRESSION: Improving left basilar density with probable residual left base atelectasis. Electronically Signed   By: Rolm Baptise M.D.   On: 06/18/2017 11:22   Assessment and Plan :   Pneumonia of right middle lobe due to infectious organism ([place])  Pleural effusion  Abnormal chest x-ray - Plan: DG Chest 2 View  S/P CABG x 4  Coronary artery disease due to lipid rich plaque  Essential hypertension  Right-sided abnormality seen on chest x-ray on 06/11/2017 is resolved with Levaquin.  Patient states that he feels really good.  We will recheck his chest x-ray in 3 weeks for the left side.  ER and return to clinic precautions discussed.  Jaynee Eagles, PA-C [facility] [phone] 06/18/2017 11:29 AM

## 2017-06-20 ENCOUNTER — Encounter: Payer: Self-pay | Admitting: Urgent Care

## 2017-07-09 ENCOUNTER — Ambulatory Visit (INDEPENDENT_AMBULATORY_CARE_PROVIDER_SITE_OTHER): Payer: Medicare HMO | Admitting: Urgent Care

## 2017-07-09 ENCOUNTER — Other Ambulatory Visit: Payer: Self-pay | Admitting: Cardiothoracic Surgery

## 2017-07-09 ENCOUNTER — Encounter: Payer: Self-pay | Admitting: Urgent Care

## 2017-07-09 ENCOUNTER — Ambulatory Visit (INDEPENDENT_AMBULATORY_CARE_PROVIDER_SITE_OTHER): Payer: Medicare HMO

## 2017-07-09 VITALS — BP 122/72 | HR 79 | Temp 97.2°F | Resp 17 | Ht 67.5 in | Wt 186.0 lb

## 2017-07-09 DIAGNOSIS — J181 Lobar pneumonia, unspecified organism: Secondary | ICD-10-CM

## 2017-07-09 DIAGNOSIS — J90 Pleural effusion, not elsewhere classified: Secondary | ICD-10-CM

## 2017-07-09 DIAGNOSIS — I2583 Coronary atherosclerosis due to lipid rich plaque: Secondary | ICD-10-CM

## 2017-07-09 DIAGNOSIS — Z951 Presence of aortocoronary bypass graft: Secondary | ICD-10-CM | POA: Diagnosis not present

## 2017-07-09 DIAGNOSIS — R9389 Abnormal findings on diagnostic imaging of other specified body structures: Secondary | ICD-10-CM | POA: Diagnosis not present

## 2017-07-09 DIAGNOSIS — J189 Pneumonia, unspecified organism: Secondary | ICD-10-CM

## 2017-07-09 DIAGNOSIS — I2510 Atherosclerotic heart disease of native coronary artery without angina pectoris: Secondary | ICD-10-CM

## 2017-07-09 NOTE — Progress Notes (Signed)
   MRN: [id number] DOB: [birth year]  Subjective:   [name] is a 77 y.o. male presenting for follow up on left sided pleural effusion as seen on previous chest x-ray.  Reports that he is doing very well, denies fever, chest pain, heart racing, shortness of breath, palpitations.  He is being more active at home, is able to drive now and is even mowed his lawn.  He does have some itching around his neckline anteriorly.  Has not tried any medications for this.  He does have follow-up with his cardiologist this Thursday, 07/11/2017.  Ayush has a current medication list which includes the following prescription(s): acetaminophen, aspirin ec, carvedilol, clopidogrel, furosemide, hydrochlorothiazide, rosuvastatin, valsartan, and potassium chloride. Also is allergic to lipitor [atorvastatin] and pork-derived products.  Mylan  has a past medical history of Blurred vision (05/17/2016), CAD (coronary artery disease), native coronary artery (06/14/2008), Chest pain (04/16/2012), Diplopia, Essential hypertension (05/17/2016), Hyperlipidemia with target low density lipoprotein (LDL) cholesterol less than 70 mg/dL (06/14/2008), Hypertension, Hypertensive heart disease (06/14/2008), Hypertensive urgency, malignant (04/16/2012), Internal hemorrhoids (04/19/2012), Ischemic stroke ([place]) (05/02/2016), pandiverticulosis (04/22/2012), Polycythemia vera(238.4) (04/17/2012), Syncope and collapse (04/17/2012), and Vertigo. Also  has a past surgical history that includes Coronary stent placement; LEFT HEART CATH AND CORONARY ANGIOGRAPHY (N/A, 04/16/2017); Coronary artery bypass graft (N/A, 04/17/2017); and TEE without cardioversion (N/A, 04/17/2017).  Objective:   Vitals: BP 122/72   Pulse 79   Temp (!) 97.2 F (36.2 C) (Oral)   Resp 17   Ht 5' 7.5" (1.715 m)   Wt 186 lb (84.4 kg)   SpO2 98%   BMI 28.70 kg/m   Physical Exam  Constitutional: He is oriented to person, place, and time. He appears well-developed and  well-nourished.  HENT:  Mouth/Throat: Oropharynx is clear and moist.  Eyes: Right eye exhibits no discharge. Left eye exhibits no discharge. No scleral icterus.  Cardiovascular: Normal rate, regular rhythm and intact distal pulses. Exam reveals no gallop and no friction rub.  No murmur heard. Pulmonary/Chest: No respiratory distress. He has no wheezes. He has no rales.    Neurological: He is alert and oriented to person, place, and time.  Skin: Skin is warm and dry. Rash (dry scaly area over lower portion of anterior neck) noted.  Psychiatric: He has a normal mood and affect.   Dg Chest 2 View  Result Date: 07/09/2017 CLINICAL DATA:  Follow-up left pleural effusion EXAM: CHEST - 2 VIEW COMPARISON:  06/18/2017 FINDINGS: Cardiac shadow is stable. Postsurgical changes are again seen. Previously seen changes in the left base posteriorly continue to improved from the prior study. No sizable effusion is noted. No bony abnormality is seen. IMPRESSION: Continued improved aeration in the left base. Electronically Signed   By: Inez Catalina M.D.   On: 07/09/2017 11:01    Assessment and Plan :   Pleural effusion - Plan: DG Chest 2 View  Abnormal chest x-ray - Plan: DG Chest 2 View  Coronary artery disease due to lipid rich plaque  Pneumonia of right middle lobe due to infectious organism (HCC)  S/P CABG x 4  His chest x-ray report is very reassuring.  Patient is to keep follow-up with his cardiologist.  Recommended use over-the-counter cortisone 10 for his itching on his neck.  Recheck in 6 months.  Jaynee Eagles, PA-C [facility] [phone] 07/09/2017 11:16 AM

## 2017-07-09 NOTE — Patient Instructions (Addendum)
You can use Cortizone 10 twice daily for itching.      Derrame pleural (Pleural Effusion) Un derrame pleural es una acumulacin anormal de lquido en las capas de tejido que se encuentran entre los pulmones y el interior del trax (espacio pleural). Estas dos capas de tejido que recubren los pulmones y el interior del trax se denominan pleura. Generalmente, en el espacio de la pleura no hay aire, solo hay una fina capa de lquido. Si no se trata, puede acumularse una gran cantidad de lquido y provocar el colapso del pulmn. Por lo general, un derrame pleural se produce por otra enfermedad que requiere tratamiento. Los dos tipos principales de derrame pleural son los siguientes:  Derrame pleural trasudativo. Esto sucede cuando el lquido se filtra dentro del espacio pleural debido al recuento bajo de protenas en la sangre o a la hipertensin de los vasos sanguneos. A menudo, la causa es la insuficiencia cardaca.  Derrame exudativo. Esto ocurre cuando el lquido de los vasos linfticos y los vasos sanguneos obstruidos se acumula en el espacio pleural. Algunas enfermedades pulmonares, lesiones y tipos de cncer pueden provocar este tipo de derrame. CAUSAS Las causas del derrame pleural pueden ser las siguientes:  Insuficiencia cardaca.  Un cogulo sanguneo en el pulmn (embolia pulmonar).  Neumona.  Cncer.  Insuficiencia heptica (cirrosis).  Enfermedades renales.  Complicaciones de [place], por ejemplo, [place] a corazn abierto. SIGNOS Y SNTOMAS En algunos casos, el derrame pleural no produce sntomas. Entre los sntomas se pueden incluir los siguientes:  Dificultad respiratoria, en especial al estar recostado.  A menudo, el dolor de trax empeora al hacer inspiraciones profundas.  Cristy Hilts.  Tos seca prolongada (crnica).  Hipo.  Respiracin rpida. Una enfermedad preexistente que causa el derrame pleural (como insuficiencia cardaca, neumona, cogulos  sanguneos, tuberculosis o cncer) tambin causar otros sntomas. DIAGNSTICO Su mdico puede determinar la presencia de derrame pleural en funcin de sus sntomas e historia clnica. Tambin Teacher, early years/pre un examen fsico y Ardelia Mems radiografa de trax. Si la radiografa muestra que hay lquido en el trax, es posible que le tengan que extraer este lquido mediante una aguja (toracocentesis) para Advertising account planner. Tambin se le pueden [employer] siguientes estudios:  Estudios de diagnstico por imgenes del trax, por ejemplo: ? Ecografa. ? Tomografa computarizada.  Anlisis de sangre para Aeronautical engineer actividad heptica y renal. TRATAMIENTO El tratamiento depende de la causa del derrame pleural. El tratamiento puede incluir lo siguiente:  Tomar antibiticos para eliminar la infeccin que est causando el derrame pleural.  La colocacin de un tubo en el trax para drenar el derrame (drenaje torcico). Este procedimiento suele usarse cuando hay una infeccin en el lquido.  Ciruga para extirpar la capa externa de tejido fibroso del espacio pleural (pleurectoma).  Toracocentesis, lo cual puede mejorar la tos y la dificultad respiratoria.  Un procedimiento, que se utiliza para Health visitor en la cavidad torcica para sellar el espacio pleural a fin de evitar la acumulacin de lquido (pleurodesis).  Quimioterapia y radioterapia. Pueden requerirse en el caso de derrame pleural canceroso (maligno). [place] los medicamentos solamente como se lo haya indicado el mdico.  Lleve un registro del tiempo que puede hacer ejercicios suaves antes de que le resulte difcil respirar. Al principio, solo trate de caminar.  No consuma ningn producto que contenga tabaco, lo que incluye cigarrillos, tabaco de Higher education careers adviser o Psychologist, sport and exercise. Si necesita ayuda para dejar de fumar, consulte al mdico.  Concurra a  todas las visitas de control como se lo haya  indicado el mdico. Esto es importante. SOLICITE ATENCIN MDICA SI:  La cantidad de tiempo que puede hacer ejercicio disminuye o no mejora con el transcurso del [place].  Tiene dolor o signos de infeccin en el sitio de la puncin si se someti a una toracocentesis. Est atento a lo siguiente: ? Secrecin. ? Enrojecimiento. ? Hinchazn.  Tiene fiebre. SOLICITE ATENCIN [place] DE INMEDIATO SI:  Comienza a sentir falta de aire.  Siente dolor en el pecho.  Comienza a tener tos. ASEGRESE DE QUE:  Comprende estas instrucciones.  Controlar su afeccin.  Recibir ayuda de inmediato si no mejora o si empeora. Esta informacin no tiene Marine scientist el consejo del mdico. Asegrese de hacerle al mdico cualquier pregunta que tenga. Document Released: 05/24/2008 Document Revised: 02/26/2014 Document Reviewed: 07/01/2013 Elsevier Interactive Patient Education  2018 [employer].     IF you received an x-ray today, you will receive an invoice from [facility]. Please contact [facility] at [phone] with questions or concerns regarding your invoice.   IF you received labwork today, you will receive an invoice from [facility]. Please contact [facility] at [phone] with questions or concerns regarding your invoice.   Our billing staff will not be able to assist you with questions regarding bills from these companies.  You will be contacted with the lab results as soon as they are available. The fastest way to get your results is to activate your My Chart account. Instructions are located on the last page of this paperwork. If you have not heard from [place] regarding the results in 2 weeks, please contact this office.

## 2017-07-11 ENCOUNTER — Other Ambulatory Visit: Payer: Self-pay

## 2017-07-11 ENCOUNTER — Ambulatory Visit (INDEPENDENT_AMBULATORY_CARE_PROVIDER_SITE_OTHER): Payer: Self-pay | Admitting: Cardiothoracic Surgery

## 2017-07-11 ENCOUNTER — Encounter: Payer: Self-pay | Admitting: Cardiothoracic Surgery

## 2017-07-11 VITALS — BP 119/74 | HR 73 | Ht 67.5 in | Wt 185.0 lb

## 2017-07-11 DIAGNOSIS — Z951 Presence of aortocoronary bypass graft: Secondary | ICD-10-CM

## 2017-07-11 NOTE — Progress Notes (Signed)
[address]       [place] [address]             [phone]      Jacey Macintyre [facility] Medical Record #[id number] Date of Birth: Oct 03, 1940  Referring: Sherren Mocha, MD Primary Care: Jaynee Eagles, PA-C Primary Cardiologist: No primary care provider on file.   Chief Complaint:   POST OP FOLLOW UP 04/17/2017 OPERATIVE REPORT PREOPERATIVE DIAGNOSIS:  Severe 3-vessel coronary artery disease with high-grade left main obstruction and recent non-ST elevation myocardial infarction. POSTOPERATIVE DIAGNOSIS:  Severe 3-vessel coronary artery disease with high-grade left main obstruction and recent non-ST elevation myocardial infarction. SURGICAL PROCEDURE:  Urgent coronary artery bypass grafting x4 with the left internal mammary to the distal left anterior descending coronary artery, reverse saphenous vein graft to the diagonal coronary artery, reverse saphenous vein graft to the circumflex coronary artery, reverse saphenous vein graft to the mid posterior descending coronary artery with right thigh and calf endo vein harvesting. SURGEON:  Lanelle Bal, MD.   History of Present Illness:      Patient proceeding well following coronary artery bypass grafting for left main obstruction and severe three-vessel coronary artery disease done February 2019.  The patient had a small pleural effusions, he returns today with a follow-up chest x-ray.  He had decided against going to cardiac rehab.  He has been increasing his physical activity appropriately.  He has had no recurrent angina.   Past Medical History:  Diagnosis Date  . Blurred vision 05/17/2016  . CAD (coronary artery disease), native coronary artery 06/14/2008   a. BMS to LCx & OM 2008 b. 03/2017 CABG x 4 (LIMA to LAD, SVG to diag 1, SVG to distal Circ, SVG to PDA)  . Chest pain 04/16/2012  . Diplopia   . Essential hypertension 05/17/2016  . Hyperlipidemia with target low density lipoprotein (LDL)  cholesterol less than 70 mg/dL 06/14/2008   Qualifier: Diagnosis of  By: Mare Ferrari, RMA, [name]    . Hypertension   . Hypertensive heart disease 06/14/2008   Qualifier: Diagnosis of  By: Mare Ferrari, [place], [name]    . Hypertensive urgency, malignant 04/16/2012  . Internal hemorrhoids 04/19/2012  . Ischemic stroke ([place]) 05/02/2016  . pandiverticulosis 04/22/2012   12/17/2011. [place]. Juanita Craver MD. Colonoscopy. Moderate sized internal hemorrhoids and extensive pandiverticulosis. Repeat 5 years.    . Polycythemia vera(238.4) 04/17/2012  . Syncope and collapse 04/17/2012   Pt syncopized while sitting in bed giving history @ time of admission to hospital Tachycardic (appeared sinus) to 120s-130s and hypotensive to 50s/30s.  Unresponsive initially >> Spontaneously resolved after 2-3 minutes >> return to baseline ~30 minutes   . Vertigo      Social History   Tobacco Use  Smoking Status Never Smoker  Smokeless Tobacco Never Used    Social History   Substance and Sexual Activity  Alcohol Use No  . Alcohol/week: 0.0 oz     Allergies  Allergen Reactions  . Lipitor [Atorvastatin] Shortness Of Breath  . Pork-Derived Products Other (See Comments)    No pork products - unspecified    Current Outpatient Medications  Medication Sig Dispense Refill  . acetaminophen (TYLENOL) 325 MG tablet Take 2 tablets (650 mg total) by mouth every 6 (six) hours as needed for mild pain.    Marland Kitchen aspirin EC 81 MG tablet Take 81 mg by mouth daily.    . carvedilol (COREG) 12.5 MG tablet Take 1 tablet (12.5 mg  total) 2 (two) times daily by mouth. 60 tablet 0  . clopidogrel (PLAVIX) 75 MG tablet TAKE 1 TABLET (75 MG TOTAL) DAILY BY MOUTH. 30 tablet 0  . furosemide (LASIX) 20 MG tablet Take 1 tablet (20 mg total) by mouth daily. 14 tablet 1  . hydrochlorothiazide (HYDRODIURIL) 25 MG tablet Take 1 tablet (25 mg total) by mouth daily. 90 tablet 2  . rosuvastatin (CRESTOR) 40 MG tablet Take 1 tablet (40 mg total)  by mouth daily. Dose change 90 tablet 3  . valsartan (DIOVAN) 160 MG tablet Take 1 tablet (160 mg total) by mouth daily. Dose change 90 tablet 3  . potassium chloride (K-DUR,KLOR-CON) 10 MEQ tablet Take 1 tablet (10 mEq total) by mouth daily for 14 days. 14 tablet 1   No current facility-administered medications for this visit.        Physical Exam: BP 119/74 (BP Location: Left Arm, Patient Position: Sitting, Cuff Size: Normal)   Pulse 73   Ht 5' 7.5" (1.715 m)   Wt 185 lb (83.9 kg)   SpO2 97% Comment: RA  BMI 28.55 kg/m   General appearance: alert, cooperative and no distress Head: Normocephalic, without obvious abnormality, atraumatic Neck: no adenopathy, no carotid bruit, no JVD, supple, symmetrical, trachea midline and thyroid not enlarged, symmetric, no tenderness/mass/nodules Lymph nodes: Cervical, supraclavicular, and axillary nodes normal. Resp: clear to auscultation bilaterally Back: symmetric, no curvature. ROM normal. No CVA tenderness. Cardio: regular rate and rhythm, S1, S2 normal, no murmur, click, rub or gallop GI: soft, non-tender; bowel sounds normal; no masses,  no organomegaly Extremities: extremities normal, atraumatic, no cyanosis or edema and Homans sign is negative, no sign of DVT Neurologic: Grossly normal  Diagnostic Studies & Laboratory data:     Recent Radiology Findings:   Dg Chest 2 View  Result Date: 07/09/2017 CLINICAL DATA:  Follow-up left pleural effusion EXAM: CHEST - 2 VIEW COMPARISON:  06/18/2017 FINDINGS: Cardiac shadow is stable. Postsurgical changes are again seen. Previously seen changes in the left base posteriorly continue to improved from the prior study. No sizable effusion is noted. No bony abnormality is seen. IMPRESSION: Continued improved aeration in the left base. Electronically Signed   By: Inez Catalina M.D.   On: 07/09/2017 11:01   I have independently reviewed the above radiology studies  and reviewed the findings with the  patient.      Recent Lab Findings: Lab Results  Component Value Date   WBC 13.7 (H) 05/01/2017   HGB 10.6 (L) 05/01/2017   HCT 31.6 (L) 05/01/2017   PLT 453 (H) 05/01/2017   GLUCOSE 120 (H) 06/11/2017   CHOL 101 05/29/2017   TRIG 84 05/29/2017   HDL 35 (L) 05/29/2017   LDLDIRECT 150.8 06/30/2008   LDLCALC 49 05/29/2017   ALT 18 05/29/2017   AST 12 05/29/2017   NA 140 06/11/2017   K 3.8 06/11/2017   CL 100 06/11/2017   CREATININE 0.91 06/11/2017   BUN 14 06/11/2017   CO2 26 06/11/2017   TSH 4.888 (H) 04/17/2012   INR 1.55 04/17/2017   HGBA1C 5.9 (H) 04/15/2017      Assessment / Plan:   1/Status post coronary artery bypass grafting done in February 2019, with postop pleural effusions which have now resolved.  2/hypertension-patient is on hydrochlorothiazide, Diovan, Coreg, and Lasix-with his effusions pedal edema resolved will discontinue Lasix and potassium.  Patient doing well following coronary artery bypass grafting, without evidence of recurrent pleural effusions We will plan to see  the patient back as necessary, we will continue close follow-up with primary care and cardiology     [name] MD      [address] [place] [address] Office [phone]   Beeper [phone]  07/11/2017 9:56 AM

## 2017-07-20 ENCOUNTER — Other Ambulatory Visit: Payer: Self-pay | Admitting: Cardiovascular Disease

## 2017-07-23 ENCOUNTER — Telehealth (HOSPITAL_COMMUNITY): Payer: Self-pay | Admitting: Pharmacist

## 2017-07-24 NOTE — Telephone Encounter (Signed)
Cardiac Rehab - Pharmacy Resident Documentation   Patient unable to be reached after three call attempts. Please complete allergy verification and medication review during patient's cardiac rehab appointment.    Nida Boatman, PharmD PGY1 Acute Care Pharmacy Resident

## 2017-08-01 ENCOUNTER — Telehealth (HOSPITAL_COMMUNITY): Payer: Self-pay

## 2017-08-01 ENCOUNTER — Ambulatory Visit (HOSPITAL_COMMUNITY): Payer: Medicare HMO

## 2017-08-01 NOTE — Telephone Encounter (Signed)
No call no show to Cardiac Rehab. Patient indicated in his follow up appt that he does not plan to attend Cardiac Rehab. Closed referral.

## 2017-08-05 ENCOUNTER — Ambulatory Visit: Payer: Medicare HMO | Admitting: Cardiovascular Disease

## 2017-08-07 ENCOUNTER — Ambulatory Visit (HOSPITAL_COMMUNITY): Payer: Medicare HMO

## 2017-08-09 ENCOUNTER — Ambulatory Visit (HOSPITAL_COMMUNITY): Payer: Medicare HMO

## 2017-08-12 ENCOUNTER — Ambulatory Visit (HOSPITAL_COMMUNITY): Payer: Medicare HMO

## 2017-08-13 ENCOUNTER — Ambulatory Visit: Payer: Medicare HMO | Admitting: Physician Assistant

## 2017-08-13 ENCOUNTER — Encounter: Payer: Self-pay | Admitting: Physician Assistant

## 2017-08-13 VITALS — BP 134/76 | HR 71 | Ht 66.0 in | Wt 191.0 lb

## 2017-08-13 DIAGNOSIS — E785 Hyperlipidemia, unspecified: Secondary | ICD-10-CM | POA: Diagnosis not present

## 2017-08-13 DIAGNOSIS — I2510 Atherosclerotic heart disease of native coronary artery without angina pectoris: Secondary | ICD-10-CM | POA: Diagnosis not present

## 2017-08-13 DIAGNOSIS — I10 Essential (primary) hypertension: Secondary | ICD-10-CM

## 2017-08-13 MED ORDER — CLOPIDOGREL BISULFATE 75 MG PO TABS: 75.0000 mg | ORAL_TABLET | Freq: Every day | ORAL | 3 refills | Status: DC

## 2017-08-13 MED ORDER — HYDROCHLOROTHIAZIDE 25 MG PO TABS: 25.0000 mg | ORAL_TABLET | Freq: Every day | ORAL | 3 refills | Status: DC

## 2017-08-13 NOTE — Progress Notes (Signed)
Cardiology Office Note:    Date:  08/13/2017   ID:  [name], DOB September 22, 1940, MRN [id number]  PCP:  Jaynee Eagles, PA-C  Cardiologist:  Sherren Mocha, MD   Referring MD: Jaynee Eagles, PA-C   Chief Complaint  Patient presents with  . Follow-up    CAD    History of Present Illness:    [name] is a 77 y.o. male with coronary artery disease, hypertension, hyperlipidemia, prior stroke.  He is status post BMS to the LCx and OM in 2008.  He was then admitted in February 2019 with non-ST elevation myocardial infarction.  He had multivessel CAD on cardiac catheterization and proceeded to CABG.  He was last seen in clinic March 2019.      [name] returns for follow-up on coronary artery disease.  He is here with his wife and son.  He denies any chest discomfort or significant shortness of breath.  He denies PND or syncope.  He does note right lower extremity swelling with prolonged standing.  His vein harvest was taken from his leg.  Prior CV studies:   The following studies were reviewed today:  Cardiac Catheterization 04/16/17  -The left ventricular systolic function is normal.  LV end diastolic pressure is mildly elevated.  The left ventricular ejection fraction is 55-65% by visual estimate.  Prox RCA lesion is 50% stenosed.  Mid RCA lesion is 95% stenosed.  Dist RCA lesion is 50% stenosed with 90% stenosed side branch in Ost RPDA.  Dist LM to Ost LAD lesion is 90% stenosed.  Ost 1st Diag lesion is 100% stenosed.  Prox LAD lesion is 85% stenosed.  Mid LAD lesion is 80% stenosed.  Ost 1st Mrg to 1st Mrg lesion is 80% stenosed.  Ost Cx lesion is 90% stenosed.  Prox Cx lesion is 50% stenosed. 1.  Critical left main and multivessel coronary artery disease with severe  proximal LAD stenosis, severe ostial left circumflex stenosis and severe  in-stent restenosis, and severe mid RCA stenosis 2.  Normal LV systolic function with mildly elevated  LVEDP  Carotid US 04/16/2017 R ICA 1-39; L ICA 1-39   Echo 05/03/16 Moderate concentric LVH, EF 60-65, normal wall motion, grade 1 diastolic dysfunction, mild AI, mildly dilated aortic root (40 mm), trivial MR/TR   [facility] 06/2010 LAD ostial 50, proximal 80, distal 60-70 LCx small AV groove 99; OM1 70-80 proximal and 90 distal RCA 30 proximal; PDA ostial 90 EF 70 PCI: BMS to the OM and BMS to the LCx  Past Medical History:  Diagnosis Date  . Blurred vision 05/17/2016  . CAD (coronary artery disease), native coronary artery 06/14/2008   a. BMS to LCx & OM 2008 b. 03/2017 CABG x 4 (LIMA to LAD, SVG to diag 1, SVG to distal Circ, SVG to PDA)  . Chest pain 04/16/2012  . Diplopia   . Essential hypertension 05/17/2016  . Hyperlipidemia with target low density lipoprotein (LDL) cholesterol less than 70 mg/dL 06/14/2008   Qualifier: Diagnosis of  By: Mare Ferrari, RMA, [name]    . Hypertension   . Hypertensive heart disease 06/14/2008   Qualifier: Diagnosis of  By: Mare Ferrari, [place], [name]    . Hypertensive urgency, malignant 04/16/2012  . Hypokalemia 04/14/2017  . Internal hemorrhoids 04/19/2012  . Ischemic stroke ([place]) 05/02/2016  . Non-ST elevation (NSTEMI) myocardial infarction ([place])   . pandiverticulosis 04/22/2012   12/17/2011. [place]. Juanita Craver MD. Colonoscopy. Moderate sized internal hemorrhoids and extensive pandiverticulosis. Repeat 5 years.    Gerald Kitchen  Polycythemia vera(238.4) 04/17/2012  . S/P CABG x 4 04/17/2017  . Syncope and collapse 04/17/2012   Pt syncopized while sitting in bed giving history @ time of admission to hospital Tachycardic (appeared sinus) to 120s-130s and hypotensive to 50s/30s.  Unresponsive initially >> Spontaneously resolved after 2-3 minutes >> return to baseline ~30 minutes   . Vertigo    Surgical Hx: The patient  has a past surgical history that includes Coronary stent placement; LEFT HEART CATH AND CORONARY ANGIOGRAPHY (N/A, 04/16/2017); Coronary artery bypass  graft (N/A, 04/17/2017); and TEE without cardioversion (N/A, 04/17/2017).   Current Medications: Current Meds  Medication Sig  . acetaminophen (TYLENOL) 325 MG tablet Take 2 tablets (650 mg total) by mouth every 6 (six) hours as needed for mild pain.  Gerald Kitchen aspirin EC 81 MG tablet Take 81 mg by mouth daily.  . carvedilol (COREG) 12.5 MG tablet Take 1 tablet (12.5 mg total) 2 (two) times daily by mouth.  . clopidogrel (PLAVIX) 75 MG tablet Take 1 tablet (75 mg total) by mouth daily.  . hydrochlorothiazide (HYDRODIURIL) 25 MG tablet Take 1 tablet (25 mg total) by mouth daily.  . valsartan (DIOVAN) 160 MG tablet Take 1 tablet (160 mg total) by mouth daily. Dose change  . [DISCONTINUED] clopidogrel (PLAVIX) 75 MG tablet TAKE 1 TABLET (75 MG TOTAL) DAILY BY MOUTH.  . [DISCONTINUED] hydrochlorothiazide (HYDRODIURIL) 25 MG tablet TAKE 1 TABLET BY MOUTH DAILY     Allergies:   Lipitor [atorvastatin] and Pork-derived products   Social History   Tobacco Use  . Smoking status: Never Smoker  . Smokeless tobacco: Never Used  Substance Use Topics  . Alcohol use: No    Alcohol/week: 0.0 oz  . Drug use: No     Family Hx: The patient's family history includes Cancer in his sister; Heart disease in his mother.  ROS:   Please see the history of present illness.    ROS All other systems reviewed and are negative.   EKGs/Labs/Other Test Reviewed:    EKG:  EKG is  ordered today.  The ekg ordered today demonstrates normal sinus rhythm, heart rate 71, normal axis, nonspecific ST-T wave changes, QTC 410  Recent Labs: 04/18/2017: Magnesium 2.4 05/01/2017: Hemoglobin 10.6; Platelets 453 05/29/2017: ALT 18 06/11/2017: BUN 14; Creatinine, Ser 0.91; Potassium 3.8; Sodium 140   Recent Lipid Panel Lab Results  Component Value Date/Time   CHOL 101 05/29/2017 10:13 AM   TRIG 84 05/29/2017 10:13 AM   HDL 35 (L) 05/29/2017 10:13 AM   CHOLHDL 2.9 05/29/2017 10:13 AM   CHOLHDL 4.5 04/15/2017 02:41 AM   LDLCALC  49 05/29/2017 10:13 AM   LDLDIRECT 150.8 06/30/2008 10:07 AM    Physical Exam:    VS:  BP 134/76   Pulse 71   Ht 5\' 6"  (1.676 m)   Wt 191 lb (86.6 kg)   SpO2 98%   BMI 30.83 kg/m     Wt Readings from Last 3 Encounters:  08/13/17 191 lb (86.6 kg)  07/11/17 185 lb (83.9 kg)  07/09/17 186 lb (84.4 kg)     Physical Exam  Constitutional: He is oriented to person, place, and time. He appears well-developed and well-nourished. No distress.  HENT:  Head: Normocephalic and atraumatic.  Neck: Neck supple. No JVD present.  Cardiovascular: Normal rate, regular rhythm, S1 normal and S2 normal.  No murmur heard. Pulmonary/Chest: Breath sounds normal. He has no rales.  Abdominal: Soft. There is no hepatomegaly.  Musculoskeletal: He exhibits edema (trace  bilat leg edema R>L).  Neurological: He is alert and oriented to person, place, and time.  Skin: Skin is warm and dry.    ASSESSMENT & PLAN:    Coronary artery disease involving native coronary artery of native heart without angina pectoris Remote history of PCI.  Status post myocardial infarction in February 2019 followed by CABG.  He is currently doing well without symptoms of angina.  He ran out of Plavix and did not realize that he needed to remain on this.  He should remain on dual antiplatelet therapy for 1 year post ACS.  -Refill Plavix and continue through February 2020  -Continue aspirin, statin, beta-blocker, ARB  -Follow-up February 2020  Essential hypertension The patient's blood pressure is controlled on his current regimen.  Continue current therapy.   Hyperlipidemia with target low density lipoprotein (LDL) cholesterol less than 70 mg/dL   LDL optimal on most recent lab work.  Continue current Rx.     Dispo:  Return in about 8 months (around 04/15/2018) for Routine Follow Up, w/ Dr. Burt Knack, or Richardson Dopp, PA-C.   Medication Adjustments/Labs and Tests Ordered: Current medicines are reviewed at length with the  patient today.  Concerns regarding medicines are outlined above.  Tests Ordered: Orders Placed This Encounter  Procedures  . EKG 12-Lead   Medication Changes: Meds ordered this encounter  Medications  . clopidogrel (PLAVIX) 75 MG tablet    Sig: Take 1 tablet (75 mg total) by mouth daily.    Dispense:  90 tablet    Refill:  3  . hydrochlorothiazide (HYDRODIURIL) 25 MG tablet    Sig: Take 1 tablet (25 mg total) by mouth daily.    Dispense:  90 tablet    Refill:  3    Signed, Richardson Dopp, PA-C  08/13/2017 11:38 AM    [facility] [address], [place], [place]  [address] Phone: [phone]; Fax: [phone]

## 2017-08-13 NOTE — Patient Instructions (Signed)
Medication Instructions:  1. REFILLS HAVE BEEN SENT IN FOR PLAVIX AND HCTZ  Labwork: NONE ORDERED TODAY  Testing/Procedures: NONE ORDERED TODAY  Follow-Up: Your physician wants you to follow-up in: 03/2018 WITH DR. Emelda Fear will receive a reminder letter in the mail two months in advance. If you don't receive a letter, please call our office to schedule the follow-up appointment.   Any Other Special Instructions Will Be Listed Below (If Applicable).     If you need a refill on your cardiac medications before your next appointment, please call your pharmacy.

## 2017-08-14 ENCOUNTER — Ambulatory Visit (HOSPITAL_COMMUNITY): Payer: Medicare HMO

## 2017-08-16 ENCOUNTER — Ambulatory Visit (HOSPITAL_COMMUNITY): Payer: Medicare HMO

## 2017-08-19 ENCOUNTER — Ambulatory Visit (HOSPITAL_COMMUNITY): Payer: Medicare HMO

## 2017-08-21 ENCOUNTER — Ambulatory Visit (HOSPITAL_COMMUNITY): Payer: Medicare HMO

## 2017-08-23 ENCOUNTER — Ambulatory Visit (HOSPITAL_COMMUNITY): Payer: Medicare HMO

## 2017-08-26 ENCOUNTER — Ambulatory Visit (HOSPITAL_COMMUNITY): Payer: Medicare HMO

## 2017-08-28 ENCOUNTER — Ambulatory Visit (HOSPITAL_COMMUNITY): Payer: Medicare HMO

## 2017-08-30 ENCOUNTER — Ambulatory Visit (HOSPITAL_COMMUNITY): Payer: Medicare HMO

## 2017-09-02 ENCOUNTER — Ambulatory Visit (HOSPITAL_COMMUNITY): Payer: Medicare HMO

## 2017-09-04 ENCOUNTER — Ambulatory Visit (HOSPITAL_COMMUNITY): Payer: Medicare HMO

## 2017-09-06 ENCOUNTER — Ambulatory Visit (HOSPITAL_COMMUNITY): Payer: Medicare HMO

## 2017-09-09 ENCOUNTER — Ambulatory Visit (HOSPITAL_COMMUNITY): Payer: Medicare HMO

## 2017-09-11 ENCOUNTER — Ambulatory Visit (HOSPITAL_COMMUNITY): Payer: Medicare HMO

## 2017-09-13 ENCOUNTER — Ambulatory Visit (HOSPITAL_COMMUNITY): Payer: Medicare HMO

## 2017-09-16 ENCOUNTER — Ambulatory Visit (HOSPITAL_COMMUNITY): Payer: Medicare HMO

## 2017-09-18 ENCOUNTER — Ambulatory Visit (HOSPITAL_COMMUNITY): Payer: Medicare HMO

## 2017-09-20 ENCOUNTER — Ambulatory Visit (HOSPITAL_COMMUNITY): Payer: Medicare HMO

## 2017-09-23 ENCOUNTER — Ambulatory Visit (HOSPITAL_COMMUNITY): Payer: Medicare HMO

## 2017-09-25 ENCOUNTER — Ambulatory Visit (HOSPITAL_COMMUNITY): Payer: Medicare HMO

## 2017-09-25 ENCOUNTER — Other Ambulatory Visit: Payer: Self-pay | Admitting: Urgent Care

## 2017-09-25 DIAGNOSIS — I10 Essential (primary) hypertension: Secondary | ICD-10-CM

## 2017-09-27 ENCOUNTER — Ambulatory Visit (HOSPITAL_COMMUNITY): Payer: Medicare HMO

## 2017-09-30 ENCOUNTER — Ambulatory Visit (HOSPITAL_COMMUNITY): Payer: Medicare HMO

## 2017-10-02 ENCOUNTER — Ambulatory Visit (HOSPITAL_COMMUNITY): Payer: Medicare HMO

## 2017-10-04 ENCOUNTER — Ambulatory Visit (HOSPITAL_COMMUNITY): Payer: Medicare HMO

## 2017-10-07 ENCOUNTER — Ambulatory Visit (HOSPITAL_COMMUNITY): Payer: Medicare HMO

## 2017-10-09 ENCOUNTER — Ambulatory Visit (HOSPITAL_COMMUNITY): Payer: Medicare HMO

## 2017-10-11 ENCOUNTER — Ambulatory Visit (HOSPITAL_COMMUNITY): Payer: Medicare HMO

## 2017-10-14 ENCOUNTER — Ambulatory Visit (HOSPITAL_COMMUNITY): Payer: Medicare HMO

## 2017-10-16 ENCOUNTER — Ambulatory Visit (HOSPITAL_COMMUNITY): Payer: Medicare HMO

## 2017-10-18 ENCOUNTER — Ambulatory Visit (HOSPITAL_COMMUNITY): Payer: Medicare HMO

## 2017-10-23 ENCOUNTER — Ambulatory Visit (HOSPITAL_COMMUNITY): Payer: Medicare HMO

## 2017-10-25 ENCOUNTER — Ambulatory Visit (HOSPITAL_COMMUNITY): Payer: Medicare HMO

## 2017-10-28 ENCOUNTER — Other Ambulatory Visit: Payer: Self-pay

## 2017-10-28 ENCOUNTER — Ambulatory Visit (INDEPENDENT_AMBULATORY_CARE_PROVIDER_SITE_OTHER): Payer: Medicare HMO

## 2017-10-28 ENCOUNTER — Ambulatory Visit (INDEPENDENT_AMBULATORY_CARE_PROVIDER_SITE_OTHER): Payer: Medicare HMO | Admitting: Emergency Medicine

## 2017-10-28 ENCOUNTER — Ambulatory Visit (HOSPITAL_COMMUNITY): Payer: Medicare HMO

## 2017-10-28 ENCOUNTER — Encounter: Payer: Self-pay | Admitting: Emergency Medicine

## 2017-10-28 VITALS — BP 114/69 | HR 74 | Temp 97.8°F | Resp 16 | Wt 189.2 lb

## 2017-10-28 DIAGNOSIS — M25512 Pain in left shoulder: Secondary | ICD-10-CM | POA: Insufficient documentation

## 2017-10-28 DIAGNOSIS — M545 Low back pain, unspecified: Secondary | ICD-10-CM

## 2017-10-28 MED ORDER — HYDROCODONE-ACETAMINOPHEN 5-325 MG PO TABS: 1.0000 | ORAL_TABLET | Freq: Four times a day (QID) | ORAL | 0 refills | Status: DC | PRN

## 2017-10-28 NOTE — Progress Notes (Signed)
[name] 77 y.o.   Chief Complaint  Patient presents with  . Back Pain    x 4 days     HISTORY OF PRESENT ILLNESS: This is a 77 y.o. male complaining of pain to the low back area for the past 4 to 5 days.  Denies injury.  Denies bowel or bladder problems.  Denies abdominal pain.  Eating and drinking well.  Denies nausea or vomiting. Also complaining of pain to the left shoulder that started 4 months ago. Status post open heart surgery 5 months ago. No other significant symptoms or medical concerns today. Has been taking Tylenol with little relief.  HPI   Prior to Admission medications   Medication Sig Start Date End Date Taking? Authorizing Provider  acetaminophen (TYLENOL) 325 MG tablet Take 2 tablets (650 mg total) by mouth every 6 (six) hours as needed for mild pain. 04/24/17  Yes Barrett, Lodema Hong, PA-C  aspirin EC 81 MG tablet Take 81 mg by mouth daily.   Yes [provider]  carvedilol (COREG) 12.5 MG tablet TAKE 1 TABLET TWICE DAILY 09/25/17  Yes Jaynee Eagles, PA-C  clopidogrel (PLAVIX) 75 MG tablet Take 1 tablet (75 mg total) by mouth daily. 08/13/17  Yes Weaver, Scott T, PA-C  hydrochlorothiazide (HYDRODIURIL) 25 MG tablet Take 1 tablet (25 mg total) by mouth daily. 08/13/17  Yes Weaver, Scott T, PA-C  valsartan (DIOVAN) 160 MG tablet Take 1 tablet (160 mg total) by mouth daily. Dose change 05/01/17  Yes Bhagat, Bhavinkumar, PA  rosuvastatin (CRESTOR) 40 MG tablet Take 1 tablet (40 mg total) by mouth daily. Dose change 05/01/17 07/30/17  Leanor Kail, PA    Allergies  Allergen Reactions  . Lipitor [Atorvastatin] Shortness Of Breath  . Pork-Derived Products Other (See Comments)    No pork products - unspecified    Patient Active Problem List   Diagnosis Date Noted  . S/P CABG x 4 04/17/2017  . Non-ST elevation (NSTEMI) myocardial infarction ([place])   . Chest pain 04/14/2017  . Hypokalemia 04/14/2017  . Essential hypertension 05/17/2016  . Blurred vision  05/17/2016  . Vertigo   . Diplopia   . Ischemic stroke ([place]) 05/02/2016  . pandiverticulosis 04/22/2012  . Internal hemorrhoids 04/19/2012  . Hyperlipidemia with target low density lipoprotein (LDL) cholesterol less than 70 mg/dL 06/14/2008  . Hypertensive heart disease 06/14/2008  . Coronary artery disease due to lipid rich plaque 06/14/2008    Past Medical History:  Diagnosis Date  . Blurred vision 05/17/2016  . CAD (coronary artery disease), native coronary artery 06/14/2008   a. BMS to LCx & OM 2008 b. 03/2017 CABG x 4 (LIMA to LAD, SVG to diag 1, SVG to distal Circ, SVG to PDA)  . Chest pain 04/16/2012  . Diplopia   . Essential hypertension 05/17/2016  . Hyperlipidemia with target low density lipoprotein (LDL) cholesterol less than 70 mg/dL 06/14/2008   Qualifier: Diagnosis of  By: Mare Ferrari, RMA, [name]    . Hypertension   . Hypertensive heart disease 06/14/2008   Qualifier: Diagnosis of  By: Mare Ferrari, [place], [name]    . Hypertensive urgency, malignant 04/16/2012  . Hypokalemia 04/14/2017  . Internal hemorrhoids 04/19/2012  . Ischemic stroke ([employer]) 05/02/2016  . Non-ST elevation (NSTEMI) myocardial infarction ([place])   . pandiverticulosis 04/22/2012   12/17/2011. [place]. Juanita Craver MD. Colonoscopy. Moderate sized internal hemorrhoids and extensive pandiverticulosis. Repeat 5 years.    . Polycythemia vera(238.4) 04/17/2012  . S/P CABG x 4 04/17/2017  .  Syncope and collapse 04/17/2012   Pt syncopized while sitting in bed giving history @ time of admission to hospital Tachycardic (appeared sinus) to 120s-130s and hypotensive to 50s/30s.  Unresponsive initially >> Spontaneously resolved after 2-3 minutes >> return to baseline ~30 minutes   . Vertigo     Past Surgical History:  Procedure Laterality Date  . CORONARY ARTERY BYPASS GRAFT N/A 04/17/2017   Procedure: CORONARY ARTERY BYPASS GRAFTING (CABG) x4 , using left internal mammary artery  to LAD and right leg greater saphenous  vein harvested endoscopically  to PDA, Diagonal I and Circumflex;  Surgeon: [name], MD;  Location: [facility];  Service: Open Heart Surgery;  Laterality: N/A;  . CORONARY STENT PLACEMENT    . LEFT HEART CATH AND CORONARY ANGIOGRAPHY N/A 04/16/2017   Procedure: LEFT HEART CATH AND CORONARY ANGIOGRAPHY;  Surgeon: Sherren Mocha, MD;  Location: [facility];  Service: Cardiovascular;  Laterality: N/A;  . TEE WITHOUT CARDIOVERSION N/A 04/17/2017   Procedure: TRANSESOPHAGEAL ECHOCARDIOGRAM (TEE);  Surgeon: [name], MD;  Location: [facility];  Service: Open Heart Surgery;  Laterality: N/A;    Social History   Socioeconomic History  . Marital status: Married    Spouse name: Not on file  . Number of children: Not on file  . Years of education: Not on file  . Highest education level: Not on file  Occupational History  . Not on file  Social Needs  . Financial resource strain: Not on file  . Food insecurity:    Worry: Not on file    Inability: Not on file  . Transportation needs:    Medical: Not on file    Non-medical: Not on file  Tobacco Use  . Smoking status: Never Smoker  . Smokeless tobacco: Never Used  Substance and Sexual Activity  . Alcohol use: No    Alcohol/week: 0.0 standard drinks  . Drug use: No  . Sexual activity: Not on file  Lifestyle  . Physical activity:    Days per week: Not on file    Minutes per session: Not on file  . Stress: Not on file  Relationships  . Social connections:    Talks on phone: Not on file    Gets together: Not on file    Attends religious service: Not on file    Active member of club or organization: Not on file    Attends meetings of clubs or organizations: Not on file    Relationship status: Not on file  . Intimate partner violence:    Fear of current or ex partner: Not on file    Emotionally abused: Not on file    Physically abused: Not on file    Forced sexual activity: Not on file  Other Topics Concern  . Not on  file  Social History Narrative   Lives in [place] with Wife and 2 sons.  From [place].  To [place] ~2000.     Currently retired but worked in Veterinary surgeon    Family History  Problem Relation Age of Onset  . Heart disease Mother   . Cancer Sister      Review of Systems  Constitutional: Negative.  Negative for chills and fever.  HENT: Negative.   Respiratory: Negative.  Negative for cough and shortness of breath.   Cardiovascular: Negative.  Negative for chest pain and palpitations.  Gastrointestinal: Negative for abdominal pain, diarrhea, nausea and vomiting.  Musculoskeletal: Positive for back pain.  Skin: Negative.  Negative for rash.  Neurological: Negative.  Negative for dizziness, sensory change, focal weakness, weakness and headaches.  Endo/Heme/Allergies: Negative.   All other systems reviewed and are negative.  Vitals:   10/28/17 1213  BP: 114/69  Pulse: 74  Resp: 16  Temp: 97.8 F (36.6 C)  SpO2: 95%     Physical Exam  Constitutional: He is oriented to person, place, and time. He appears well-developed and well-nourished.  HENT:  Head: Normocephalic.  Eyes: Pupils are equal, round, and reactive to light. EOM are normal.  Neck: Normal range of motion. Neck supple.  Cardiovascular: Normal rate and regular rhythm.  Pulmonary/Chest: Effort normal and breath sounds normal.  Abdominal: Soft. There is no tenderness.  Musculoskeletal:       Left shoulder: He exhibits decreased range of motion and tenderness. He exhibits no bony tenderness, no swelling, no crepitus and normal pulse.       Lumbar back: He exhibits decreased range of motion, tenderness and spasm. He exhibits no bony tenderness and normal pulse.  Neurological: He is alert and oriented to person, place, and time. He displays normal reflexes. No sensory deficit. He exhibits normal muscle tone. Coordination normal.  Skin: Skin is warm and dry. Capillary refill takes less than 2 seconds.    Psychiatric: He has a normal mood and affect. His behavior is normal.  Vitals reviewed.  Dg Lumbar Spine 2-3 Views  Result Date: 10/28/2017 CLINICAL DATA:  Acute bilateral low back pain without sciatic symptoms. EXAM: LUMBAR SPINE - 2-3 VIEW COMPARISON:  Coronal sagittal reconstructed images through the lumbar spine from an abdominal CT scan of November 05, 2009 FINDINGS: The lumbar vertebral bodies are preserved in height. There is loss of the normal lumbar lordosis which is a chronic finding. There is mild multilevel degenerative disc space narrowing with the greatest narrowing at L4-5 and L5-S1. There is no spondylolisthesis. There is facet joint hypertrophy at L2-3 and at L5-S1. Small anterior endplate osteophytes are present at multiple levels. The pedicles and transverse processes are grossly intact. IMPRESSION: Multilevel degenerative disc disease. No compression fracture or spondylolisthesis. Facet joint hypertrophy at L2-3 and L5-S1. Electronically Signed   By: David  Martinique M.D.   On: 10/28/2017 12:43   Dg Shoulder Left  Result Date: 10/28/2017 CLINICAL DATA:  Left shoulder pain.  No mention of injury. EXAM: LEFT SHOULDER - 2+ VIEW COMPARISON:  Limited views of the left shoulder from a chest x-ray of Jul 09, 2017 FINDINGS: The bones are subjectively adequately mineralized. The glenohumeral and AC joint spaces are reasonably well-maintained. The subacromial subdeltoid space is normal. There is no acute or healing fracture. No significant osteophyte formation is observed. IMPRESSION: There is no acute or significant chronic bony abnormality of the left shoulder. Electronically Signed   By: David  Martinique M.D.   On: 10/28/2017 12:44     ASSESSMENT & PLAN:  Ladainian was seen today for back pain.  Diagnoses and all orders for this visit:  Acute bilateral low back pain without sciatica -     DG Lumbar Spine 2-3 Views; Future -     HYDROcodone-acetaminophen (NORCO) 5-325 MG tablet; Take 1  tablet by mouth every 6 (six) hours as needed for moderate pain.  Pain in joint of left shoulder -     DG Shoulder Left; Future -     HYDROcodone-acetaminophen (NORCO) 5-325 MG tablet; Take 1 tablet by mouth every 6 (six) hours as needed for moderate pain.    Patient Instructions  If you have lab work done today you will be contacted with your lab results within the next 2 weeks.  If you have not heard from [place] then please contact us. The fastest way to get your results is to register for My Chart.   IF you received an x-ray today, you will receive an invoice from [facility]. Please contact [facility] at [phone] with questions or concerns regarding your invoice.   IF you received labwork today, you will receive an invoice from [facility]. Please contact [facility] at [phone] with questions or concerns regarding your invoice.   Our billing staff will not be able to assist you with questions regarding bills from these companies.  You will be contacted with the lab results as soon as they are available. The fastest way to get your results is to activate your My Chart account. Instructions are located on the last page of this paperwork. If you have not heard from [place] regarding the results in 2 weeks, please contact this office.     Back Pain, Adult Back pain is very common. The pain often gets better over time. The cause of back pain is usually not dangerous. Most people can learn to manage their back pain on their own. Follow these instructions at home: Watch your back pain for any changes. The following actions may help to lessen any pain you are feeling:  Stay active. Start with short walks on flat ground if you can. Try to walk farther each day.  Exercise regularly as told by your doctor. Exercise helps your back heal faster. It also helps avoid future injury by keeping your muscles strong and flexible.  Do not sit, drive, or stand in one place for  more than 30 minutes.  Do not stay in bed. Resting more than 1-2 days can slow down your recovery.  Be careful when you bend or lift an object. Use good form when lifting: ? Bend at your knees. ? Keep the object close to your body. ? Do not twist.  Sleep on a firm mattress. Lie on your side, and bend your knees. If you lie on your back, put a pillow under your knees.  Take medicines only as told by your doctor.  Put ice on the injured area. ? Put ice in a plastic bag. ? Place a towel between your skin and the bag. ? Leave the ice on for 20 minutes, 2-3 times a day for the first 2-3 days. After that, you can switch between ice and heat packs.  Avoid feeling anxious or stressed. Find good ways to deal with stress, such as exercise.  Maintain a healthy weight. Extra weight puts stress on your back.  Contact a doctor if:  You have pain that does not go away with rest or medicine.  You have worsening pain that goes down into your legs or buttocks.  You have pain that does not get better in one week.  You have pain at night.  You lose weight.  You have a fever or chills. Get help right away if:  You cannot control when you poop (bowel movement) or pee (urinate).  Your arms or legs feel weak.  Your arms or legs lose feeling (numbness).  You feel sick to your stomach (nauseous) or throw up (vomit).  You have belly (abdominal) pain.  You feel like you may pass out (faint). This information is not intended to replace advice given to you by your health care provider. Make sure  you discuss any questions you have with your health care provider. Document Released: 07/25/2007 Document Revised: 07/14/2015 Document Reviewed: 06/09/2013 Elsevier Interactive Patient Education  2018 [employer]vier Inc.     Agustina Caroli, MD [facility]

## 2017-10-28 NOTE — Patient Instructions (Addendum)
   If you have lab work done today you will be contacted with your lab results within the next 2 weeks.  If you have not heard from us then please contact us. The fastest way to get your results is to register for My Chart.   IF you received an x-ray today, you will receive an invoice from [facility]. Please contact [facility] at [phone] with questions or concerns regarding your invoice.   IF you received labwork today, you will receive an invoice from [facility]. Please contact [facility] at [phone] with questions or concerns regarding your invoice.   Our billing staff will not be able to assist you with questions regarding bills from these companies.  You will be contacted with the lab results as soon as they are available. The fastest way to get your results is to activate your My Chart account. Instructions are located on the last page of this paperwork. If you have not heard from us regarding the results in 2 weeks, please contact this office.     Back Pain, Adult Back pain is very common. The pain often gets better over time. The cause of back pain is usually not dangerous. Most people can learn to manage their back pain on their own. Follow these instructions at home: Watch your back pain for any changes. The following actions may help to lessen any pain you are feeling:  Stay active. Start with short walks on flat ground if you can. Try to walk farther each day.  Exercise regularly as told by your doctor. Exercise helps your back heal faster. It also helps avoid future injury by keeping your muscles strong and flexible.  Do not sit, drive, or stand in one place for more than 30 minutes.  Do not stay in bed. Resting more than 1-2 days can slow down your recovery.  Be careful when you bend or lift an object. Use good form when lifting: ? Bend at your knees. ? Keep the object close to your body. ? Do not twist.  Sleep on a firm mattress. Lie on your  side, and bend your knees. If you lie on your back, put a pillow under your knees.  Take medicines only as told by your doctor.  Put ice on the injured area. ? Put ice in a plastic bag. ? Place a towel between your skin and the bag. ? Leave the ice on for 20 minutes, 2-3 times a day for the first 2-3 days. After that, you can switch between ice and heat packs.  Avoid feeling anxious or stressed. Find good ways to deal with stress, such as exercise.  Maintain a healthy weight. Extra weight puts stress on your back.  Contact a doctor if:  You have pain that does not go away with rest or medicine.  You have worsening pain that goes down into your legs or buttocks.  You have pain that does not get better in one week.  You have pain at night.  You lose weight.  You have a fever or chills. Get help right away if:  You cannot control when you poop (bowel movement) or pee (urinate).  Your arms or legs feel weak.  Your arms or legs lose feeling (numbness).  You feel sick to your stomach (nauseous) or throw up (vomit).  You have belly (abdominal) pain.  You feel like you may pass out (faint). This information is not intended to replace advice given to you by your health care   provider. Make sure you discuss any questions you have with your health care provider. Document Released: 07/25/2007 Document Revised: 07/14/2015 Document Reviewed: 06/09/2013 Elsevier Interactive Patient Education  2018 [employer]  

## 2017-10-30 ENCOUNTER — Ambulatory Visit (HOSPITAL_COMMUNITY): Payer: Medicare HMO

## 2017-10-31 ENCOUNTER — Ambulatory Visit: Payer: Medicare HMO | Admitting: Emergency Medicine

## 2017-11-01 ENCOUNTER — Ambulatory Visit (HOSPITAL_COMMUNITY): Payer: Medicare HMO

## 2017-11-04 ENCOUNTER — Emergency Department (HOSPITAL_COMMUNITY): Payer: Medicare HMO

## 2017-11-04 ENCOUNTER — Ambulatory Visit (HOSPITAL_COMMUNITY): Payer: Medicare HMO

## 2017-11-04 ENCOUNTER — Observation Stay (HOSPITAL_COMMUNITY)
Admission: EM | Admit: 2017-11-04 | Discharge: 2017-11-05 | Disposition: A | Discharge status: Home / Self Care | Payer: Medicare HMO | Attending: Student in an Organized Health Care Education/Training Program | Admitting: Student in an Organized Health Care Education/Training Program

## 2017-11-04 ENCOUNTER — Observation Stay (HOSPITAL_COMMUNITY): Payer: Medicare HMO

## 2017-11-04 ENCOUNTER — Encounter (HOSPITAL_COMMUNITY): Payer: Self-pay | Admitting: Emergency Medicine

## 2017-11-04 DIAGNOSIS — Z885 Allergy status to narcotic agent status: Secondary | ICD-10-CM | POA: Diagnosis not present

## 2017-11-04 DIAGNOSIS — I252 Old myocardial infarction: Secondary | ICD-10-CM | POA: Diagnosis not present

## 2017-11-04 DIAGNOSIS — R9389 Abnormal findings on diagnostic imaging of other specified body structures: Secondary | ICD-10-CM | POA: Diagnosis not present

## 2017-11-04 DIAGNOSIS — I2699 Other pulmonary embolism without acute cor pulmonale: Secondary | ICD-10-CM | POA: Diagnosis not present

## 2017-11-04 DIAGNOSIS — Z79899 Other long term (current) drug therapy: Secondary | ICD-10-CM | POA: Insufficient documentation

## 2017-11-04 DIAGNOSIS — I700 Atherosclerosis of aorta: Secondary | ICD-10-CM | POA: Diagnosis not present

## 2017-11-04 DIAGNOSIS — R42 Dizziness and giddiness: Secondary | ICD-10-CM | POA: Diagnosis not present

## 2017-11-04 DIAGNOSIS — K5790 Diverticulosis of intestine, part unspecified, without perforation or abscess without bleeding: Secondary | ICD-10-CM | POA: Insufficient documentation

## 2017-11-04 DIAGNOSIS — Z7982 Long term (current) use of aspirin: Secondary | ICD-10-CM | POA: Diagnosis not present

## 2017-11-04 DIAGNOSIS — R4182 Altered mental status, unspecified: Secondary | ICD-10-CM | POA: Diagnosis not present

## 2017-11-04 DIAGNOSIS — M545 Low back pain: Secondary | ICD-10-CM | POA: Insufficient documentation

## 2017-11-04 DIAGNOSIS — I313 Pericardial effusion (noninflammatory): Secondary | ICD-10-CM | POA: Diagnosis not present

## 2017-11-04 DIAGNOSIS — Z888 Allergy status to other drugs, medicaments and biological substances status: Secondary | ICD-10-CM | POA: Diagnosis not present

## 2017-11-04 DIAGNOSIS — I119 Hypertensive heart disease without heart failure: Secondary | ICD-10-CM | POA: Diagnosis not present

## 2017-11-04 DIAGNOSIS — M25512 Pain in left shoulder: Secondary | ICD-10-CM | POA: Diagnosis not present

## 2017-11-04 DIAGNOSIS — R001 Bradycardia, unspecified: Secondary | ICD-10-CM | POA: Insufficient documentation

## 2017-11-04 DIAGNOSIS — Z8673 Personal history of transient ischemic attack (TIA), and cerebral infarction without residual deficits: Secondary | ICD-10-CM | POA: Insufficient documentation

## 2017-11-04 DIAGNOSIS — Z951 Presence of aortocoronary bypass graft: Secondary | ICD-10-CM | POA: Insufficient documentation

## 2017-11-04 DIAGNOSIS — R531 Weakness: Secondary | ICD-10-CM | POA: Diagnosis not present

## 2017-11-04 DIAGNOSIS — K449 Diaphragmatic hernia without obstruction or gangrene: Secondary | ICD-10-CM | POA: Diagnosis not present

## 2017-11-04 DIAGNOSIS — H538 Other visual disturbances: Secondary | ICD-10-CM | POA: Insufficient documentation

## 2017-11-04 DIAGNOSIS — R918 Other nonspecific abnormal finding of lung field: Secondary | ICD-10-CM | POA: Diagnosis not present

## 2017-11-04 DIAGNOSIS — I10 Essential (primary) hypertension: Secondary | ICD-10-CM | POA: Diagnosis present

## 2017-11-04 DIAGNOSIS — E785 Hyperlipidemia, unspecified: Secondary | ICD-10-CM | POA: Diagnosis not present

## 2017-11-04 DIAGNOSIS — R4781 Slurred speech: Secondary | ICD-10-CM | POA: Diagnosis not present

## 2017-11-04 DIAGNOSIS — Z7902 Long term (current) use of antithrombotics/antiplatelets: Secondary | ICD-10-CM | POA: Diagnosis not present

## 2017-11-04 DIAGNOSIS — M25519 Pain in unspecified shoulder: Secondary | ICD-10-CM | POA: Diagnosis not present

## 2017-11-04 DIAGNOSIS — Z955 Presence of coronary angioplasty implant and graft: Secondary | ICD-10-CM | POA: Insufficient documentation

## 2017-11-04 DIAGNOSIS — E876 Hypokalemia: Secondary | ICD-10-CM | POA: Diagnosis present

## 2017-11-04 DIAGNOSIS — I2583 Coronary atherosclerosis due to lipid rich plaque: Secondary | ICD-10-CM | POA: Insufficient documentation

## 2017-11-04 DIAGNOSIS — R55 Syncope and collapse: Secondary | ICD-10-CM | POA: Diagnosis not present

## 2017-11-04 DIAGNOSIS — R0789 Other chest pain: Secondary | ICD-10-CM | POA: Diagnosis not present

## 2017-11-04 DIAGNOSIS — R0602 Shortness of breath: Secondary | ICD-10-CM | POA: Diagnosis not present

## 2017-11-04 DIAGNOSIS — I2510 Atherosclerotic heart disease of native coronary artery without angina pectoris: Secondary | ICD-10-CM | POA: Insufficient documentation

## 2017-11-04 DIAGNOSIS — R079 Chest pain, unspecified: Secondary | ICD-10-CM | POA: Diagnosis not present

## 2017-11-04 LAB — HEPATIC FUNCTION PANEL
ALT: 13 U/L (ref 0–44)
AST: 17 U/L (ref 15–41)
Albumin: 3.3 g/dL — ABNORMAL LOW (ref 3.5–5.0)
Alkaline Phosphatase: 63 U/L (ref 38–126)
BILIRUBIN DIRECT: 0.2 mg/dL (ref 0.0–0.2)
BILIRUBIN INDIRECT: 0.7 mg/dL (ref 0.3–0.9)
Total Bilirubin: 0.9 mg/dL (ref 0.3–1.2)
Total Protein: 6.6 g/dL (ref 6.5–8.1)

## 2017-11-04 LAB — BASIC METABOLIC PANEL
Anion gap: 8 (ref 5–15)
BUN: 13 mg/dL (ref 8–23)
CALCIUM: 9.4 mg/dL (ref 8.9–10.3)
CO2: 29 mmol/L (ref 22–32)
CREATININE: 1.06 mg/dL (ref 0.61–1.24)
Chloride: 101 mmol/L (ref 98–111)
GFR calc Af Amer: >60 mL/min (ref >60)
GLUCOSE: 135 mg/dL — AB (ref 70–99)
Potassium: 3.4 mmol/L — ABNORMAL LOW (ref 3.5–5.1)
Sodium: 138 mmol/L (ref 135–145)

## 2017-11-04 LAB — URINALYSIS, ROUTINE W REFLEX MICROSCOPIC
BILIRUBIN URINE: NEGATIVE
Glucose, UA: NEGATIVE mg/dL
HGB URINE DIPSTICK: NEGATIVE
KETONES UR: NEGATIVE mg/dL
Leukocytes, UA: NEGATIVE
Nitrite: NEGATIVE
Protein, ur: NEGATIVE mg/dL
SPECIFIC GRAVITY, URINE: 1.026 (ref 1.005–1.030)
pH: 7 (ref 5.0–8.0)

## 2017-11-04 LAB — I-STAT TROPONIN, ED: TROPONIN I, POC: 0 ng/mL (ref 0.00–0.08)

## 2017-11-04 LAB — CBC
HCT: 41.2 % (ref 39.0–52.0)
Hemoglobin: 13.5 g/dL (ref 13.0–17.0)
MCH: 29.4 pg (ref 26.0–34.0)
MCHC: 32.8 g/dL (ref 30.0–36.0)
MCV: 89.8 fL (ref 78.0–100.0)
PLATELETS: 195 10*3/uL (ref 150–400)
RBC: 4.59 MIL/uL (ref 4.22–5.81)
RDW: 14.3 % (ref 11.5–15.5)
WBC: 10.9 10*3/uL — ABNORMAL HIGH (ref 4.0–10.5)

## 2017-11-04 LAB — D-DIMER, QUANTITATIVE (NOT AT ARMC): D DIMER QUANT: 0.82 ug{FEU}/mL — AB (ref 0.00–0.50)

## 2017-11-04 MED ORDER — ACETAMINOPHEN 325 MG PO TABS: 650.0000 mg | ORAL_TABLET | Freq: Four times a day (QID) | ORAL | Status: DC | PRN

## 2017-11-04 MED ORDER — IOPAMIDOL (ISOVUE-370) INJECTION 76%
INTRAVENOUS | Status: AC
  Filled 2017-11-04: qty 100

## 2017-11-04 MED ORDER — SODIUM CHLORIDE 0.9% FLUSH
3.0000 mL | Freq: Two times a day (BID) | INTRAVENOUS | Status: DC
  Administered 2017-11-04 – 2017-11-05 (×2): 3 mL via INTRAVENOUS

## 2017-11-04 MED ORDER — IRBESARTAN 300 MG PO TABS
150.0000 mg | ORAL_TABLET | Freq: Every day | ORAL | Status: DC
  Administered 2017-11-05: 150 mg via ORAL
  Filled 2017-11-04: qty 1

## 2017-11-04 MED ORDER — CLOPIDOGREL BISULFATE 75 MG PO TABS
75.0000 mg | ORAL_TABLET | Freq: Every day | ORAL | Status: DC
  Administered 2017-11-05: 75 mg via ORAL
  Filled 2017-11-04: qty 1

## 2017-11-04 MED ORDER — ASPIRIN EC 81 MG PO TBEC
81.0000 mg | DELAYED_RELEASE_TABLET | Freq: Every day | ORAL | Status: DC
  Administered 2017-11-05: 81 mg via ORAL
  Filled 2017-11-04: qty 1

## 2017-11-04 MED ORDER — POLYETHYLENE GLYCOL 3350 17 G PO PACK
17.0000 g | PACK | Freq: Every day | ORAL | Status: DC | PRN
  Administered 2017-11-05: 17 g via ORAL
  Filled 2017-11-04: qty 1

## 2017-11-04 MED ORDER — CARVEDILOL 12.5 MG PO TABS
12.5000 mg | ORAL_TABLET | Freq: Two times a day (BID) | ORAL | Status: DC
  Administered 2017-11-05: 12.5 mg via ORAL
  Filled 2017-11-04: qty 1

## 2017-11-04 MED ORDER — ROSUVASTATIN CALCIUM 20 MG PO TABS
40.0000 mg | ORAL_TABLET | Freq: Every day | ORAL | Status: DC
  Administered 2017-11-05: 40 mg via ORAL
  Filled 2017-11-04: qty 2

## 2017-11-04 MED ORDER — IOPAMIDOL (ISOVUE-370) INJECTION 76%
100.0000 mL | Freq: Once | INTRAVENOUS | Status: AC | PRN
  Administered 2017-11-04: 100 mL via INTRAVENOUS

## 2017-11-04 MED ORDER — ACETAMINOPHEN 650 MG RE SUPP: 650.0000 mg | Freq: Four times a day (QID) | RECTAL | Status: DC | PRN

## 2017-11-04 MED ORDER — ONDANSETRON HCL 4 MG/2ML IJ SOLN: 4.0000 mg | Freq: Four times a day (QID) | INTRAMUSCULAR | Status: DC | PRN

## 2017-11-04 MED ORDER — ONDANSETRON HCL 4 MG PO TABS: 4.0000 mg | ORAL_TABLET | Freq: Four times a day (QID) | ORAL | Status: DC | PRN

## 2017-11-04 NOTE — ED Notes (Signed)
Pt in MRI at this time 

## 2017-11-04 NOTE — ED Triage Notes (Signed)
Pt was working outside for approx 30 min, started having pain to left shoulder radiating to L chest. Pt feeling SOB, generalized weakness. Pt diaphoretic, having trouble talking initially. EMS reports he is alert and oriented and talking clearly. Pt has left lateral lower back pain as well. BP 147/73, HR 57

## 2017-11-04 NOTE — ED Notes (Signed)
Pt transported to CT ?

## 2017-11-04 NOTE — H&P (Addendum)
Date: 11/04/2017               Patient Name:  [name] MRN: [id number]  DOB: November 02, 1940 Age / Sex: 77 y.o., male   PCP: Jaynee Eagles, PA-C         Medical Service: Internal Medicine Teaching Service         Attending Physician: Dr. Evette Doffing, Mallie Mussel, *    First Contact: Dr. Koleen Distance Pager: [phone]  Second Contact: Dr. Berline Lopes Pager: [phone]       After Hours (After 5p/  First Contact Pager: [phone]  weekends / holidays): Second Contact Pager: [phone]   Chief Complaint: weakness  History of Present Illness:  [name] is a 77yo male with history of NSTEMI, CAD s/p cabgx4 (03/2017), ischemic stroke, hypokalemia, HTN, and HLD who presented to [facility] ED after having an unwitnessed syncopal vs. presyncopal episode earlier today. He states he was working in his yard for about five minutes when he began to feel dizzy and diaphoretic. He went to sit down, had decreased vision, and is unsure of whether he completely passed out. He was able to sit down but does not know if he hit his head or how long he was out. His wife arrived home from the bank and found him. His grandson and daughter were at bedside during our discussion and provided additional history. His son, who also works in the ED, states that when he arrived a few minutes later his grandfather was still on the ground and speaking. However, speech was slurred, and he was not making sense in what he was saying. He performed a stroke screen, and his grandfather was weak but symmetrical in strength and endorsed decreased sensation in his RLE, which he states is still present. He does have intermittent numbness and weakness in this leg ever since his CABG in February as his saphenous vein graft was taken from his right leg.  He denies recent illness or any recent previous episodes similar to this. He did have a stroke in 04/2016 for which he has no permanent deficits from, but his previous presentation was very similar to  today's. He states he has felt weaker since his CABG earlier this year, but he continues to work in his yard.  He denies chest pain and shortness of breath at rest or with exertion. He endorsed left shoulder pain but states this has been a chronic issue for which he was recently prescribed hydrocodone. He finished his last pill earlier today. He states he has been eating well and drinking fluids. He denies changes in BM and urination.    Meds:  Current Meds  Medication Sig  . acetaminophen (TYLENOL) 325 MG tablet Take 2 tablets (650 mg total) by mouth every 6 (six) hours as needed for mild pain.  Marland Kitchen aspirin EC 81 MG tablet Take 81 mg by mouth daily.  . carvedilol (COREG) 12.5 MG tablet TAKE 1 TABLET TWICE DAILY (Patient taking differently: Take 12.5 mg by mouth 2 (two) times daily with a meal. )  . clopidogrel (PLAVIX) 75 MG tablet Take 1 tablet (75 mg total) by mouth daily.  . hydrochlorothiazide (HYDRODIURIL) 25 MG tablet Take 1 tablet (25 mg total) by mouth daily.  Marland Kitchen HYDROcodone-acetaminophen (NORCO) 5-325 MG tablet Take 1 tablet by mouth every 6 (six) hours as needed for moderate pain.  . rosuvastatin (CRESTOR) 40 MG tablet Take 1 tablet (40 mg total) by mouth daily. Dose change (Patient taking differently: Take 40 mg  by mouth at bedtime. Dose change)  . valsartan (DIOVAN) 160 MG tablet Take 1 tablet (160 mg total) by mouth daily. Dose change (Patient taking differently: Take 80 mg by mouth daily. Dose change)     Allergies: Allergies as of 11/04/2017 - Review Complete 11/04/2017  Allergen Reaction Noted  . Lipitor [atorvastatin] Shortness Of Breath and Other (See Comments) 07/21/2013  . Morphine and related Shortness Of Breath, Swelling, and Rash 11/04/2017  . Pork-derived products Swelling and Rash 04/17/2012   Past Medical History:  Diagnosis Date  . Blurred vision 05/17/2016  . CAD (coronary artery disease), native coronary artery 06/14/2008   a. BMS to LCx & OM 2008 b. 03/2017 CABG  x 4 (LIMA to LAD, SVG to diag 1, SVG to distal Circ, SVG to PDA)  . Chest pain 04/16/2012  . Diplopia   . Essential hypertension 05/17/2016  . Hyperlipidemia with target low density lipoprotein (LDL) cholesterol less than 70 mg/dL 06/14/2008   Qualifier: Diagnosis of  By: Mare Ferrari, RMA, [name]    . Hypertension   . Hypertensive heart disease 06/14/2008   Qualifier: Diagnosis of  By: Mare Ferrari, [place], [name]    . Hypertensive urgency, malignant 04/16/2012  . Hypokalemia 04/14/2017  . Internal hemorrhoids 04/19/2012  . Ischemic stroke ([place]) 05/02/2016  . Non-ST elevation (NSTEMI) myocardial infarction ([place])   . pandiverticulosis 04/22/2012   12/17/2011. [place]. Juanita Craver MD. Colonoscopy. Moderate sized internal hemorrhoids and extensive pandiverticulosis. Repeat 5 years.    . Polycythemia vera(238.4) 04/17/2012  . S/P CABG x 4 04/17/2017  . Syncope and collapse 04/17/2012   Pt syncopized while sitting in bed giving history @ time of admission to hospital Tachycardic (appeared sinus) to 120s-130s and hypotensive to 50s/30s.  Unresponsive initially >> Spontaneously resolved after 2-3 minutes >> return to baseline ~30 minutes   . Vertigo     Family History:  Family History  Problem Relation Age of Onset  . Heart disease Mother   . Cancer Sister     Social History:  He denies tobacco use, alcohol use, and recreational drug use.   Review of Systems: A complete ROS was negative except as per HPI.   Physical Exam: Blood pressure (!) 147/67, pulse 66, temperature 98.4 F (36.9 C), temperature source Oral, resp. rate (!) 26, SpO2 95 %.  Constitution: NAD, sitting up in bed, obese, cooperative HEENT: [place]/AT, PERRLA, EOM intact Cardio: regular rate and rhythm, nml S1 and S2 Respiratory: clear to auscultation bilaterally, no w/r/r Abdominal: +bs, NTTP, soft, non-distended MSK: Pain with left arm abduction, strength symmetrical UE b/l; strength 4/5 right leg flexion & extension, 5/5  left; strength 5/5 plantarflexion & dorsiflexion b/l; sensation decreased on right leg and foot Neuro: A&Ox3, CN II-XII intact, nml finger to nose test, 2+ reflexes b/l Skin: clean, dry, intact   EKG: personally reviewed my interpretation is normal sinus rhythm with lateral T wave inversion  CXR: personally reviewed my interpretation is cardiomegaly, otherwise no acute abnormality.   CT Angio Chest: 1. Small web-like subsegmental filling defect in the right lower lobe posterior basal segment pulmonary artery is most compatible with a small focus of chronic pulmonary embolus. No findings for acute pulmonary embolus. 2. Aortic Atherosclerosis (ICD10-I70.0). Coronary atherosclerosis and prior CABG. 3. Small posterior pericardial effusion. 4. Small to moderate type 1 hiatal hernia. 5. Mild lingular atelectasis or scarring.   By: Van Clines M.D.   On: 11/04/2017 17:55  CT Head: IMPRESSION: Chronic atrophic and ischemic changes without acute  intracranial abnormality.  Mucosal thickening throughout the paranasal sinuses. This appears chronic from the prior exam. Electronically Signed   By: Inez Catalina M.D.   On: 11/04/2017 16:34   Assessment & Plan by Problem: Active Problems:   Near syncope  77 yo male with history of MI s/p cabgx4 (03/2017), ischemic stroke, hypokalemia, HTN, HLD, and CAD who presented to [facility] ED after having an unwitnessed syncopal episode.  Presyncopal Episode: Patient had symptoms of sudden onset diaphoresis, weakness, and possible syncopal event, similar to stroke symptoms from 2018. Case was discussed with neurology by the ED who recommended MRI, which showed no acute abnormality. Previous carotid US in February without stenosis. Chest xr and CT head without acute findings as well. CT Angio of chest did not show acute PE but possible small chronic PE and pericardial effusion. Troponins wnl. His exam was notable for RLE weakness and numbness, but patient  states these symptoms have been present since saphenous vein removal earlier this year. Overall, this event appears to be secondary to vaso-vagal episode given prodromal symptoms but will rule out other possible etiologies.   - orthostatics pending - repeat troponins - admit to telemetry  - am CBC, BMP  HTN: blood pressure uncontrolled in the ED. Per patient he took his blood pressure medications today.  - cont. Coreg 12.5 mg bid - hold HCTZ 25 mg qd  Hypokalemia: likely secondary to HCTZ. Held at this time, but he took his home dose earlier today.   - repleting K  HLD, history of CVA: continued home crestor, plavix & asa 81 mg.   IVF: none Diet: heart healthy VTE: SCDs  Dispo: Admit patient to Observation with expected length of stay less than 2 midnights.  SignedMarty Heck, DO 11/04/2017, 9:16 PM  Pager: [phone]

## 2017-11-04 NOTE — ED Provider Notes (Signed)
[facility] EMERGENCY DEPARTMENT Provider Note   CSN: [id number] Arrival date & time: 11/04/17  1433     History   Chief Complaint Chief Complaint  Patient presents with  . Shortness of Breath  . Chest Pain    HPI [name] is a 77 y.o. male. Level 5 caveat secondary to language barrier HPI 77 year old male who is native Spanish speaker presents today complaining of onset of generalized weakness.  He was in his usual state of health which has been somewhat decreased since having bypass surgery last March but felt well and wanted to be working outside.  He went to do some weed eating in the yard.  He had sudden onset of generalized weakness and felt very cold but had some sweating.  He was able to get himself to the front porch.  Family states that he was not his usual state and was not speaking clearly when they found him there.  EMS was called and brought him to the ED.  He improved sometime during transport but is unclear what interventions occurred to make him improve.  He has no history of diabetes.  He had some associated upper left back pain.  He has been seen for some mid back pain last week.  He and his wife states that the left upper back pain today was different than it was last week when he was seen by his primary care doctor.  He also has some associated shortness of breath.  He has some associated nausea but has not vomited.  He denies any history of rectal bleeding.  He has not had any recent medication changes and reports taking his medication as at home Past Medical History:  Diagnosis Date  . Blurred vision 05/17/2016  . CAD (coronary artery disease), native coronary artery 06/14/2008   a. BMS to LCx & OM 2008 b. 03/2017 CABG x 4 (LIMA to LAD, SVG to diag 1, SVG to distal Circ, SVG to PDA)  . Chest pain 04/16/2012  . Diplopia   . Essential hypertension 05/17/2016  . Hyperlipidemia with target low density lipoprotein (LDL) cholesterol less than 70  mg/dL 06/14/2008   Qualifier: Diagnosis of  By: Mare Ferrari, RMA, [name]    . Hypertension   . Hypertensive heart disease 06/14/2008   Qualifier: Diagnosis of  By: Mare Ferrari, [place], [name]    . Hypertensive urgency, malignant 04/16/2012  . Hypokalemia 04/14/2017  . Internal hemorrhoids 04/19/2012  . Ischemic stroke ([place]) 05/02/2016  . Non-ST elevation (NSTEMI) myocardial infarction ([place])   . pandiverticulosis 04/22/2012   12/17/2011. [place]. Juanita Craver MD. Colonoscopy. Moderate sized internal hemorrhoids and extensive pandiverticulosis. Repeat 5 years.    . Polycythemia vera(238.4) 04/17/2012  . S/P CABG x 4 04/17/2017  . Syncope and collapse 04/17/2012   Pt syncopized while sitting in bed giving history @ time of admission to hospital Tachycardic (appeared sinus) to 120s-130s and hypotensive to 50s/30s.  Unresponsive initially >> Spontaneously resolved after 2-3 minutes >> return to baseline ~30 minutes   . Vertigo     Patient Active Problem List   Diagnosis Date Noted  . Acute bilateral low back pain without sciatica 10/28/2017  . Pain in joint of left shoulder 10/28/2017  . S/P CABG x 4 04/17/2017  . Non-ST elevation (NSTEMI) myocardial infarction ([place])   . Chest pain 04/14/2017  . Hypokalemia 04/14/2017  . Essential hypertension 05/17/2016  . Blurred vision 05/17/2016  . Vertigo   . Diplopia   . Ischemic  stroke ([place]) 05/02/2016  . pandiverticulosis 04/22/2012  . Internal hemorrhoids 04/19/2012  . Hyperlipidemia with target low density lipoprotein (LDL) cholesterol less than 70 mg/dL 06/14/2008  . Hypertensive heart disease 06/14/2008  . Coronary artery disease due to lipid rich plaque 06/14/2008    Past Surgical History:  Procedure Laterality Date  . CORONARY ARTERY BYPASS GRAFT N/A 04/17/2017   Procedure: CORONARY ARTERY BYPASS GRAFTING (CABG) x4 , using left internal mammary artery  to LAD and right leg greater saphenous vein harvested endoscopically  to PDA, Diagonal I  and Circumflex;  Surgeon: [name], MD;  Location: [facility];  Service: Open Heart Surgery;  Laterality: N/A;  . CORONARY STENT PLACEMENT    . LEFT HEART CATH AND CORONARY ANGIOGRAPHY N/A 04/16/2017   Procedure: LEFT HEART CATH AND CORONARY ANGIOGRAPHY;  Surgeon: Sherren Mocha, MD;  Location: [facility];  Service: Cardiovascular;  Laterality: N/A;  . TEE WITHOUT CARDIOVERSION N/A 04/17/2017   Procedure: TRANSESOPHAGEAL ECHOCARDIOGRAM (TEE);  Surgeon: [name], MD;  Location: [facility];  Service: Open Heart Surgery;  Laterality: N/A;        Home Medications    Prior to Admission medications   Medication Sig Start Date End Date Taking? Authorizing Provider  acetaminophen (TYLENOL) 325 MG tablet Take 2 tablets (650 mg total) by mouth every 6 (six) hours as needed for mild pain. 04/24/17  Yes Barrett, Lodema Hong, PA-C  aspirin EC 81 MG tablet Take 81 mg by mouth daily.   Yes [provider]  carvedilol (COREG) 12.5 MG tablet TAKE 1 TABLET TWICE DAILY Patient taking differently: Take 12.5 mg by mouth 2 (two) times daily with a meal.  09/25/17  Yes Jaynee Eagles, PA-C  clopidogrel (PLAVIX) 75 MG tablet Take 1 tablet (75 mg total) by mouth daily. 08/13/17  Yes Weaver, Scott T, PA-C  hydrochlorothiazide (HYDRODIURIL) 25 MG tablet Take 1 tablet (25 mg total) by mouth daily. 08/13/17  Yes Weaver, Scott T, PA-C  HYDROcodone-acetaminophen (NORCO) 5-325 MG tablet Take 1 tablet by mouth every 6 (six) hours as needed for moderate pain. 10/28/17  Yes Sagardia, Ines Bloomer, MD  rosuvastatin (CRESTOR) 40 MG tablet Take 1 tablet (40 mg total) by mouth daily. Dose change Patient taking differently: Take 40 mg by mouth at bedtime. Dose change 05/01/17 12/04/17 Yes Bhagat, Bhavinkumar, PA  valsartan (DIOVAN) 160 MG tablet Take 1 tablet (160 mg total) by mouth daily. Dose change Patient taking differently: Take 80 mg by mouth daily. Dose change 05/01/17  Yes Bhagat, [place], PA    Family  History Family History  Problem Relation Age of Onset  . Heart disease Mother   . Cancer Sister     Social History Social History   Tobacco Use  . Smoking status: Never Smoker  . Smokeless tobacco: Never Used  Substance Use Topics  . Alcohol use: No    Alcohol/week: 0.0 standard drinks  . Drug use: No     Allergies   Lipitor [atorvastatin]; Morphine and related; and Pork-derived products   Review of Systems Review of Systems  Constitutional: Positive for chills and fatigue.  HENT: Negative.   Eyes: Negative.   Respiratory: Positive for shortness of breath.   Cardiovascular: Positive for chest pain.  Gastrointestinal: Positive for nausea.  Endocrine: Negative.   Genitourinary: Negative.   Musculoskeletal: Positive for back pain.  Allergic/Immunologic: Negative.   Neurological: Positive for numbness.  Hematological: Negative.   Psychiatric/Behavioral: Negative.   All other systems reviewed and are  negative.    Physical Exam Updated Vital Signs BP 129/65   Pulse (!) 55   Temp 98.4 F (36.9 C) (Oral)   Resp (!) 21   SpO2 100%   Physical Exam  Constitutional: He is oriented to person, place, and time. He appears well-developed and well-nourished.  HENT:  Head: Normocephalic and atraumatic.  Mouth/Throat: Oropharynx is clear and moist.  Eyes: Pupils are equal, round, and reactive to light. EOM are normal.  Neck: Normal range of motion. Neck supple.  Cardiovascular: Regular rhythm, normal heart sounds and intact distal pulses. Bradycardia present.  Pulmonary/Chest: Effort normal and breath sounds normal.  Abdominal: Soft. Bowel sounds are normal.  Musculoskeletal: Normal range of motion. He exhibits no edema.  Neurological: He is alert and oriented to person, place, and time. He displays normal reflexes. A sensory deficit is present. No cranial nerve deficit. Coordination normal.  Decreased sharp sensation knee down to toes on right lower extremity  Skin:  Skin is warm and dry. Capillary refill takes less than 2 seconds.  Psychiatric: He has a normal mood and affect. His behavior is normal.  Nursing note and vitals reviewed.    ED Treatments / Results  Labs (all labs ordered are listed, but only abnormal results are displayed) Labs Reviewed  BASIC METABOLIC PANEL - Abnormal; Notable for the following components:      Result Value   Potassium 3.4 (*)    Glucose, Bld 135 (*)    All other components within normal limits  CBC - Abnormal; Notable for the following components:   WBC 10.9 (*)    All other components within normal limits  HEPATIC FUNCTION PANEL - Abnormal; Notable for the following components:   Albumin 3.3 (*)    All other components within normal limits  D-DIMER, QUANTITATIVE (NOT AT [facility]) - Abnormal; Notable for the following components:   D-Dimer, Quant 0.82 (*)    All other components within normal limits  I-STAT TROPONIN, ED    EKG EKG Interpretation  Date/Time:  Monday November 04 2017 14:39:51 EDT Ventricular Rate:  59 PR Interval:    QRS Duration: 91 QT Interval:  444 QTC Calculation: 440 R Axis:   25 Text Interpretation:  Sinus rhythm Nonspecific T abnormalities, lateral leads Confirmed by Pattricia Boss [phone]) on 11/04/2017 2:46:25 PM Also confirmed by Pattricia Boss [phone]), editor Philomena Doheny [phone])  on 11/04/2017 3:05:16 PM   Radiology Dg Chest Port 1 View  Result Date: 11/04/2017 CLINICAL DATA:  Shortness of breath. EXAM: PORTABLE CHEST 1 VIEW COMPARISON:  07/09/2017 FINDINGS: The heart size and mediastinal contours are within normal limits. CABG. Aortic atherosclerosis. Both lungs are clear. The visualized skeletal structures are unremarkable. IMPRESSION: No active disease. Aortic Atherosclerosis (ICD10-I70.0). Electronically Signed   By: Lorriane Shire M.D.   On: 11/04/2017 15:27    Procedures .Critical Care Performed by: Pattricia Boss, MD Authorized by: Pattricia Boss, MD   Critical care  provider statement:    Critical care time (minutes):  45   Critical care end time:  11/04/2017 6:47 PM   Critical care was necessary to treat or prevent imminent or life-threatening deterioration of the following conditions:  CNS failure or compromise   Critical care was time spent personally by me on the following activities:  Discussions with consultants, evaluation of patient's response to treatment, examination of patient, ordering and performing treatments and interventions, ordering and review of laboratory studies, ordering and review of radiographic studies, pulse oximetry, re-evaluation of patient's condition, obtaining  history from patient or surrogate and review of old charts   (including critical care time)  Medications Ordered in ED Medications - No data to display   Initial Impression / Assessment and Plan / ED Course  I have reviewed the triage vital signs and the nursing notes.  Pertinent labs & imaging results that were available during my care of the patient were reviewed by me and considered in my medical decision making (see chart for details).     77 yo male ho stroke, cad s/p cabg presents today with sudden onset of chills sweating, ams, rle numbness. No headache,  ekg with nsst changes lateral leads, first troponin normal.  Labs reviewed and noted to have mild hypokalemia, mild hyperglycemia, and mild leukocytosis.  Patient outside acute stroke window with lkn at 10 am.  No lvo signs on exam.   Consider ischemia, arrhythmia, stroke, infection.  Awaiting head ct, plan consult for admission for further evaluation and treatment.   reviewed CT angios of chest with question of chronic PE. 1-syncope/ams 2- chest/shoulder pain 3-altered sensation rle-neurology advises mri 4-abnormal chest ct-? PE- consider dopplers 5- abnormal ekg 6- mild hypokalemia PMD [place]- unassigned Discussed with Dr. Berline Lopes and will see for admission Family updated Vitals:   11/04/17 1730  11/04/17 1800  BP: 137/83 (!) 146/76  Pulse: 60 (!) 57  Resp: 16 (!) 24  Temp:    SpO2: 100% 98%   All labs and radiographic studies reviewed.  Final Clinical Impressions(s) / ED Diagnoses   Final diagnoses:  Weakness    ED Discharge Orders    None       Pattricia Boss, MD 11/04/17 [phone]

## 2017-11-05 ENCOUNTER — Other Ambulatory Visit: Payer: Self-pay

## 2017-11-05 ENCOUNTER — Observation Stay (HOSPITAL_BASED_OUTPATIENT_CLINIC_OR_DEPARTMENT_OTHER): Payer: Medicare HMO

## 2017-11-05 ENCOUNTER — Telehealth (HOSPITAL_COMMUNITY): Payer: Self-pay

## 2017-11-05 DIAGNOSIS — I2699 Other pulmonary embolism without acute cor pulmonale: Secondary | ICD-10-CM

## 2017-11-05 DIAGNOSIS — Z91018 Allergy to other foods: Secondary | ICD-10-CM

## 2017-11-05 DIAGNOSIS — Z888 Allergy status to other drugs, medicaments and biological substances status: Secondary | ICD-10-CM | POA: Diagnosis not present

## 2017-11-05 DIAGNOSIS — Z951 Presence of aortocoronary bypass graft: Secondary | ICD-10-CM

## 2017-11-05 DIAGNOSIS — R9389 Abnormal findings on diagnostic imaging of other specified body structures: Secondary | ICD-10-CM | POA: Diagnosis not present

## 2017-11-05 DIAGNOSIS — R918 Other nonspecific abnormal finding of lung field: Secondary | ICD-10-CM

## 2017-11-05 DIAGNOSIS — I2510 Atherosclerotic heart disease of native coronary artery without angina pectoris: Secondary | ICD-10-CM | POA: Diagnosis not present

## 2017-11-05 DIAGNOSIS — K449 Diaphragmatic hernia without obstruction or gangrene: Secondary | ICD-10-CM

## 2017-11-05 DIAGNOSIS — I700 Atherosclerosis of aorta: Secondary | ICD-10-CM | POA: Diagnosis not present

## 2017-11-05 DIAGNOSIS — R55 Syncope and collapse: Secondary | ICD-10-CM | POA: Diagnosis not present

## 2017-11-05 DIAGNOSIS — Z885 Allergy status to narcotic agent status: Secondary | ICD-10-CM | POA: Diagnosis not present

## 2017-11-05 LAB — BASIC METABOLIC PANEL
Anion gap: 12 (ref 5–15)
BUN: 12 mg/dL (ref 8–23)
CALCIUM: 9.3 mg/dL (ref 8.9–10.3)
CO2: 24 mmol/L (ref 22–32)
CREATININE: 0.84 mg/dL (ref 0.61–1.24)
Chloride: 101 mmol/L (ref 98–111)
GFR calc Af Amer: >60 mL/min (ref >60)
Glucose, Bld: 127 mg/dL — ABNORMAL HIGH (ref 70–99)
Potassium: 3.3 mmol/L — ABNORMAL LOW (ref 3.5–5.1)
SODIUM: 137 mmol/L (ref 135–145)

## 2017-11-05 LAB — CBC
HCT: 41.1 % (ref 39.0–52.0)
Hemoglobin: 13.6 g/dL (ref 13.0–17.0)
MCH: 29.2 pg (ref 26.0–34.0)
MCHC: 33.1 g/dL (ref 30.0–36.0)
MCV: 88.2 fL (ref 78.0–100.0)
PLATELETS: 202 10*3/uL (ref 150–400)
RBC: 4.66 MIL/uL (ref 4.22–5.81)
RDW: 14.5 % (ref 11.5–15.5)
WBC: 10.3 10*3/uL (ref 4.0–10.5)

## 2017-11-05 LAB — TROPONIN I: Troponin I: <0.03 ng/mL (ref <0.03)

## 2017-11-05 LAB — MAGNESIUM: MAGNESIUM: 2.1 mg/dL (ref 1.7–2.4)

## 2017-11-05 MED ORDER — POTASSIUM CHLORIDE CRYS ER 20 MEQ PO TBCR
40.0000 meq | EXTENDED_RELEASE_TABLET | Freq: Once | ORAL | Status: AC
  Administered 2017-11-05: 40 meq via ORAL
  Filled 2017-11-05: qty 2

## 2017-11-05 NOTE — Telephone Encounter (Signed)
Patients insurance is active and benefits verified through [facility] - $10.00 co-pay, no deductible, out of pocket amount of $3,400/$2,157.08 has been met, no co-insurance, and no pre-authorization is required. Passport/reference [phone]

## 2017-11-05 NOTE — Discharge Summary (Signed)
Name: [name] MRN: [id number] DOB: 20-Feb-1940 77 y.o. PCP: Jaynee Eagles, PA-C  Date of Admission: 11/04/2017  2:33 PM Date of Discharge: 11/05/2017 Attending Physician: No att. providers found  Discharge Diagnosis: 1. Syncope  2. Abnormal CT chest finding  Discharge Medications: Allergies as of 11/05/2017      Reactions   Lipitor [atorvastatin] Shortness Of Breath, Other (See Comments)   Sores non head   Morphine And Related Shortness Of Breath, Swelling, Rash   Throat swelling   Pork-derived Products Swelling, Rash   Tongue swelling No pork products -       Medication List    STOP taking these medications   HYDROcodone-acetaminophen 5-325 MG tablet Commonly known as:  NORCO/VICODIN     TAKE these medications   acetaminophen 325 MG tablet Commonly known as:  TYLENOL Take 2 tablets (650 mg total) by mouth every 6 (six) hours as needed for mild pain.   aspirin EC 81 MG tablet Take 81 mg by mouth daily.   carvedilol 12.5 MG tablet Commonly known as:  COREG TAKE 1 TABLET TWICE DAILY What changed:  when to take this   clopidogrel 75 MG tablet Commonly known as:  PLAVIX Take 1 tablet (75 mg total) by mouth daily.   hydrochlorothiazide 25 MG tablet Commonly known as:  HYDRODIURIL Take 1 tablet (25 mg total) by mouth daily.   rosuvastatin 40 MG tablet Commonly known as:  CRESTOR Take 1 tablet (40 mg total) by mouth daily. Dose change What changed:  when to take this   valsartan 160 MG tablet Commonly known as:  DIOVAN Take 1 tablet (160 mg total) by mouth daily. Dose change What changed:  how much to take       Disposition and follow-up:   [name] was discharged from [facility] in Good condition.  At the hospital follow up visit please address:  1.  Syncope: [name] was evaluated for syncopal event. After benign work-up, felt to be vasovagal. Ensure he has not had any further episodes. Also likely deconditioning  is a contributing factor. Home PT was ordered. Please be sure this has been arranged.  Abnormal CT chest finding - consistent with chronic subsegmental PE. LE dopplers were negative. Did not start anticoagulation given asymptomatic and increased risk of bleeding in the setting of DAPT. Would consider repeat CT in a few months to follow-up.   2.  Labs / imaging needed at time of follow-up: none  3.  Pending labs/ test needing follow-up: none  Follow-up Appointments: Follow-up Information    Jaynee Eagles, [place]. Go on 11/12/2017.   Specialty:  Urgent Care Why:  For appointment at 3 pm.  Contact information: [place] [id number] [place] Follow up.   Specialty:  Home Health Services Why:  home health services arranged Contact information: [place] [address] [phone]           Hospital Course by problem list: 1. Syncope: [name] presented to [facility] after syncopal event 5 minutes into doing yardwork. History is most consistent with vasovagal event given pro-drome beforehand. CT and MRI of head were done to rule out CVA due to mild RLE weakness and numbness (these have been chronic since CABG with venous graft in Feb). These studies were negative for acute findings to explain syncopal event. He has a history of ischemic heart disease s/p revascularization; medically  managed on DAPT for 12 months. Unremarkable ECG, troponins, and telemetry made cardiogenic syncope much less likely. Orthostatic vital signs were normal.  Deconditioning could be a contributing factor to his presentation. PT eval recommended home health PT which was arranged at discharge. He also did not take part in cardiac rehab after CABG so outpatient referral was made, as I feel this would improve his functioning at home and get him back to doing what he is used to.  He had also been on hydrocodone for left shoulder pain  the last few days prior to event which could have also played a role. Would recommend limiting centrally acting medications in the future.   2. Abnormal CT chest finding that could represent chronic subsegmental PE: Patient does not have history of pulmonary HTN or evidence of right heart strain. Bilateral LE dopplers were done which were negative. Did not feel anticoagulation was appropriate, as he is asymptomatic at this time. The risk of bleeding in the setting of DAPT outweighs any potential benefit anticoagulation would have at this point. Would monitor with follow-up scan in a few months.    Discharge Vitals:   BP (!) 141/96 (BP Location: Left Arm)   Pulse 84   Temp 98.2 F (36.8 C) (Oral)   Resp 20   Ht 5\' 6"  (1.676 m)   Wt 83.8 kg   SpO2 96%   BMI 29.82 kg/m   Pertinent Labs, Studies, and Procedures:  CT chest angio IMPRESSION: 1. Small web-like subsegmental filling defect in the right lower lobe posterior basal segment pulmonary artery is most compatible with a small focus of chronic pulmonary embolus. No findings for acute pulmonary embolus. 2. Aortic Atherosclerosis (ICD10-I70.0). Coronary atherosclerosis and prior CABG. 3. Small posterior pericardial effusion. 4. Small to moderate type 1 hiatal hernia. 5. Mild lingular atelectasis or scarring.  Discharge Instructions: Discharge Instructions    AMB Referral to Phase II Cardiac Rehab   Complete by:  As directed    Patient never had this after CABG in February. I think he is deconditioned and would benefit from cardiac rehab.   Diagnosis:  CABG   CABG X ___:  4   Call MD for:  difficulty breathing, headache or visual disturbances   Complete by:  As directed    Call MD for:  extreme fatigue   Complete by:  As directed    Call MD for:  persistant dizziness or light-headedness   Complete by:  As directed    Call MD for:  persistant nausea and vomiting   Complete by:  As directed    Call MD for:  severe uncontrolled  pain   Complete by:  As directed    Call MD for:  temperature >100.4   Complete by:  As directed    Diet - low sodium heart healthy   Complete by:  As directed    Discharge instructions   Complete by:  As directed    [name],  It was a pleasure taking care of you. I'm glad you are feeling better. Be sure to increase your activity slowly at home. I think physical therapy at home will help build up your strength and endurance. Please continue taking your medications as prescribed. I have made an appointment with your primary care doctor on Sept 24th at 3pm for hospital follow-up.  Take care, and I wish you all the best!   Increase activity slowly   Complete by:  As directed  SignedDelice Bison, DO 11/07/2017, 8:03 PM   Pager: [phone]

## 2017-11-05 NOTE — Evaluation (Signed)
Physical Therapy Evaluation Patient Details Name: [name] MRN: [id number] DOB: 02/06/1941 Today's Date: 11/05/2017   History of Present Illness  Pt is a 77 y/o male admitted secondary to syncopal episode, SOB and chest pain, thought to be likely due to vasovagal response. CT angio revealed possible chronic PE and MRI negative for acute abnormality. PMH includes HTN, CAD s/p CABG (March 2019), CVA, and NSTEMI.   Clinical Impression  Pt admitted secondary to problem above with deficits below. Pt unsteady without use of AD and required min to min guard A. Noted improved steadiness with use of RW, requiring min guard to supervision. Educated about use of RW at home to increase stability and safety. Will continue to follow acutely to maximize functional mobility independence and safety.     Follow Up Recommendations Home health PT;Supervision for mobility/OOB    Equipment Recommendations  None recommended by PT    Recommendations for Other Services       Precautions / Restrictions Precautions Precautions: Fall Restrictions Weight Bearing Restrictions: No      Mobility  Bed Mobility Overal bed mobility: Modified Independent                Transfers Overall transfer level: Needs assistance Equipment used: None;Rolling walker (2 wheeled) Transfers: Sit to/from Stand Sit to Stand: Min assist;Min guard         General transfer comment: Min A to stand without AD, as pt with increased sway. Pt with improved steadiness with use of RW, requiring min guard A and cues for hand placement.   Ambulation/Gait Ambulation/Gait assistance: Min guard;Supervision Gait Distance (Feet): 150 Feet Assistive device: None;Rolling walker (2 wheeled) Gait Pattern/deviations: Step-through pattern;Decreased stride length;Shuffle Gait velocity: Decreased    General Gait Details: Slow, unsteady gait within the room without use of AD. Reaching out for furniture to hold onto while walking.  Noted improved steadiness with use of RW. Cues for appropriate step height.   Stairs            Wheelchair Mobility    Modified Rankin (Stroke Patients Only)       Balance Overall balance assessment: Needs assistance Sitting-balance support: No upper extremity supported;Feet supported Sitting balance-Leahy Scale: Good     Standing balance support: No upper extremity supported;Bilateral upper extremity supported;During functional activity Standing balance-Leahy Scale: Poor Standing balance comment: Reliant on BUE support                              Pertinent Vitals/Pain Pain Assessment: No/denies pain    Home Living Family/patient expects to be discharged to:: Private residence Living Arrangements: Spouse/significant other;Children Available Help at Discharge: Family;Available 24 hours/day Type of Home: House Home Access: Stairs to enter Entrance Stairs-Rails: Right;Left Entrance Stairs-Number of Steps: 1 Home Layout: Two level;Able to live on main level with bedroom/bathroom Home Equipment: Gilford Rile - 2 wheels;Bedside commode      Prior Function Level of Independence: Independent with assistive device(s)         Comments: Use RW for ambulation      Hand Dominance   Dominant Hand: Right    Extremity/Trunk Assessment   Upper Extremity Assessment Upper Extremity Assessment: LUE deficits/detail LUE Deficits / Details: Reports L shoulder pain that has been present since CABG, however, able to raise shoulder overhead.     Lower Extremity Assessment Lower Extremity Assessment: Generalized weakness    Cervical / Trunk Assessment Cervical / Trunk Assessment: Kyphotic  Communication  Communication: No difficulties  Cognition Arousal/Alertness: Awake/alert Behavior During Therapy: WFL for tasks assessed/performed Overall Cognitive Status: Within Functional Limits for tasks assessed                                         General Comments General comments (skin integrity, edema, etc.): Pt's wife present at end of session.     Exercises     Assessment/Plan    PT Assessment Patient needs continued PT services  PT Problem List Decreased strength;Decreased balance;Decreased mobility;Decreased knowledge of use of DME;Decreased knowledge of precautions       PT Treatment Interventions DME instruction;Gait training;Stair training;Functional mobility training;Therapeutic activities;Therapeutic exercise;Balance training;Patient/family education    PT Goals (Current goals can be found in the Care Plan section)  Acute Rehab PT Goals Patient Stated Goal: to feel better  PT Goal Formulation: With patient Time For Goal Achievement: 11/19/17 Potential to Achieve Goals: Good    Frequency Min 3X/week   Barriers to discharge        Co-evaluation               AM-PAC PT "6 Clicks" Daily Activity  Outcome Measure Difficulty turning over in bed (including adjusting bedclothes, sheets and blankets)?: None Difficulty moving from lying on back to sitting on the side of the bed? : A Little Difficulty sitting down on and standing up from a chair with arms (e.g., wheelchair, bedside commode, etc,.)?: Unable Help needed moving to and from a bed to chair (including a wheelchair)?: A Little Help needed walking in hospital room?: A Little Help needed climbing 3-5 steps with a railing? : A Lot 6 Click Score: 16    End of Session Equipment Utilized During Treatment: Gait belt Activity Tolerance: Patient tolerated treatment well Patient left: in chair;with call bell/phone within reach;with family/visitor present Nurse Communication: Mobility status PT Visit Diagnosis: Unsteadiness on feet (R26.81);Muscle weakness (generalized) (M62.81)    Time: 6283-1517 PT Time Calculation (min) (ACUTE ONLY): 15 min   Charges:   PT Evaluation $PT Eval Low Complexity: [place], PT, DPT  Acute  Rehabilitation Services  Pager: [phone] Office: [phone]   Rudean Hitt 11/05/2017, 11:16 AM

## 2017-11-05 NOTE — Progress Notes (Signed)
   Subjective: Mr. Sporer was seen and evaluated on morning rounds. He was accompanied by wife at bedside. He is feeling much better than when he came in and has not had any recurrence of lightheadedness, diaphoresis or blurred vision. He is s/p CABG in February. His family makes efforts to prevent him from over-exerting himself, but he struggles with wanting to do the same activities he is used to doing. She has not let him do any yard-work since the surgery, but he was found down outside after attempting 5 minutes of yard-work while she was gone. He did not complete cardiac rehab after surgery.  He does endorse some dyspnea with exertion. Denies chest pain, palpitations, or history of syncope.    Objective:  Vital signs in last 24 hours: Vitals:   11/04/17 2307 11/05/17 0455 11/05/17 0458 11/05/17 0459  BP: (!) 161/95 (!) 146/86 (!) 152/92 (!) 141/96  Pulse: 65 78 62 84  Resp: 16 20    Temp: 99.4 F (37.4 C) 98.2 F (36.8 C)    TempSrc: Oral Oral    SpO2: 96% 96%    Weight: 83.8 kg     Height: 5\' 6"  (1.676 m)      General: awake, alert, pleasant gentleman lying in bed in NAD CV: RRR; no murmurs, rubs or gallops  Pulm: no increased work of breathing; lungs CTA in anterior fields GI: BS+; abdomen soft, non-distended, non-tender Ext: no lower extremity edema   Assessment/Plan:  Principal Problem:   Syncope Active Problems:   Essential hypertension   Hypokalemia   Abnormal CT of the chest  1. Syncope: History most consistent with vasovagal given pro-drome beforehand. CT and MRI of head were done to rule out CVA due to mild RLE weakness and numbness. These were unremarkable. He has a history ischemic heart disease but is status post revascularization in February and will be medically managed on DAPT for 12 months.   ECG and negative troponins were reassuring. Telemetry has not revealed any signs of arrhythmia. Given these objective findings and his history, I do not think this is  cardiogenic. Orthostatic vital signs were normal.  Deconditioning could be a contributing factor to his presentation. PT evaluation recommended home health PT which we will arrange. He had also been taking hydrocodone the last few days for left shoulder pain which also could have played a role. Would recommend limiting centrally acting medications in the future.   2. Abnormal CT chest finding that could represent chronic subsegmental PE: Patient does not have history of pulmonary HTN. Will check bilateral LE dopplers to rule out acute DVT. If these are negative, will not anticoagulate at this time as I feel the increased risk of bleeding in the setting of DAPT outweighs any potential benefit of anticoagulation.   Dispo: Anticipated discharge in approximately 0-1 day(s).   Modena Nunnery D, DO 11/05/2017, 12:59 PM Pager: [phone]

## 2017-11-05 NOTE — Care Management Note (Signed)
Case Management Note  Patient Details  Name: [name] MRN: [id number] Date of Birth: November 01, 1940  Subjective/Objective:         Presents with syncope.           Action/Plan: Transition to home with home health services to follow. Referral [facility] with [facility] to provide [facility] health services. Pt has transportation to home.  Expected Discharge Date:  11/05/17               Expected Discharge Plan:  [place]  In-House Referral:     Discharge planning Services  CM Consult  Post Acute Care Choice:    Choice offered to:  Patient  DME Arranged:   N/A DME Agency:   N/A  HH Arranged:  PT HH Agency:  [place]  Status of Service:  Completed, signed off  If discussed at [place] of Stay Meetings, dates discussed:    Additional Comments:  Sharin Mons, RN 11/05/2017, 3:18 PM

## 2017-11-05 NOTE — Progress Notes (Signed)
*  Preliminary Results* Bilateral lower extremity venous duplex completed. Bilateral lower extremities are negative for deep vein thrombosis. There is no evidence of Baker's cyst bilaterally.  11/05/2017 12:12 PM Maudry Mayhew, MHA, RVT, RDCS, RDMS

## 2017-11-05 NOTE — Progress Notes (Signed)
[place] On to be D/C'd Home per MD order.  Discussed prescriptions and follow up appointments with the patient. Prescriptions given to patient, medication list explained in detail. Pt verbalized understanding.  Allergies as of 11/05/2017      Reactions   Lipitor [atorvastatin] Shortness Of Breath, Other (See Comments)   Sores non head   Morphine And Related Shortness Of Breath, Swelling, Rash   Throat swelling   Pork-derived Products Swelling, Rash   Tongue swelling No pork products -       Medication List    STOP taking these medications   HYDROcodone-acetaminophen 5-325 MG tablet Commonly known as:  NORCO/VICODIN     TAKE these medications   acetaminophen 325 MG tablet Commonly known as:  TYLENOL Take 2 tablets (650 mg total) by mouth every 6 (six) hours as needed for mild pain.   aspirin EC 81 MG tablet Take 81 mg by mouth daily.   carvedilol 12.5 MG tablet Commonly known as:  COREG TAKE 1 TABLET TWICE DAILY What changed:  when to take this   clopidogrel 75 MG tablet Commonly known as:  PLAVIX Take 1 tablet (75 mg total) by mouth daily.   hydrochlorothiazide 25 MG tablet Commonly known as:  HYDRODIURIL Take 1 tablet (25 mg total) by mouth daily.   rosuvastatin 40 MG tablet Commonly known as:  CRESTOR Take 1 tablet (40 mg total) by mouth daily. Dose change What changed:  when to take this   valsartan 160 MG tablet Commonly known as:  DIOVAN Take 1 tablet (160 mg total) by mouth daily. Dose change What changed:  how much to take            Durable Medical Equipment  (From admission, onward)         Start     Ordered   11/05/17 1355  For home use only DME Walker rolling  Once    Question:  Patient needs a walker to treat with the following condition  Answer:  Syncope   11/05/17 1355          Vitals:   11/05/17 0458 11/05/17 0459  BP: (!) 152/92 (!) 141/96  Pulse: 62 84  Resp:    Temp:    SpO2:      Skin clean, dry and intact without  evidence of skin break down, no evidence of skin tears noted. IV catheter discontinued intact. Site without signs and symptoms of complications. Dressing and pressure applied. Pt denies pain at this time. No complaints noted.  An After Visit Summary was printed and given to the patient. Patient escorted via [place], and D/C home via private auto.  Chapman Fitch BSN, RN [employer] [address] Phone [phone]

## 2017-11-06 ENCOUNTER — Ambulatory Visit (HOSPITAL_COMMUNITY): Payer: Medicare HMO

## 2017-11-06 ENCOUNTER — Telehealth: Payer: Self-pay | Admitting: Urgent Care

## 2017-11-06 DIAGNOSIS — R55 Syncope and collapse: Secondary | ICD-10-CM | POA: Diagnosis not present

## 2017-11-06 DIAGNOSIS — I2510 Atherosclerotic heart disease of native coronary artery without angina pectoris: Secondary | ICD-10-CM | POA: Diagnosis not present

## 2017-11-06 DIAGNOSIS — K449 Diaphragmatic hernia without obstruction or gangrene: Secondary | ICD-10-CM | POA: Diagnosis not present

## 2017-11-06 DIAGNOSIS — I700 Atherosclerosis of aorta: Secondary | ICD-10-CM | POA: Diagnosis not present

## 2017-11-06 DIAGNOSIS — I313 Pericardial effusion (noninflammatory): Secondary | ICD-10-CM | POA: Diagnosis not present

## 2017-11-06 DIAGNOSIS — I119 Hypertensive heart disease without heart failure: Secondary | ICD-10-CM | POA: Diagnosis not present

## 2017-11-06 DIAGNOSIS — D45 Polycythemia vera: Secondary | ICD-10-CM | POA: Diagnosis not present

## 2017-11-06 DIAGNOSIS — H532 Diplopia: Secondary | ICD-10-CM | POA: Diagnosis not present

## 2017-11-06 DIAGNOSIS — I259 Chronic ischemic heart disease, unspecified: Secondary | ICD-10-CM | POA: Diagnosis not present

## 2017-11-06 NOTE — Telephone Encounter (Signed)
Copied from [place] [phone]. Topic: General - Other >> Nov 06, 2017  3:57 PM Margot Ables wrote: Reason for CRM: pt was recently in hospital for syncope (felt to be cardiac). Requesting VO for home PT 1 week 1, 2 week 2. Secure VM if you need to leave msg.

## 2017-11-08 ENCOUNTER — Ambulatory Visit (HOSPITAL_COMMUNITY): Payer: Medicare HMO

## 2017-11-08 NOTE — Telephone Encounter (Signed)
[name] called back to check status of orders stating she has not heard anything back yet. Please advise.

## 2017-11-11 NOTE — Telephone Encounter (Signed)
Claiborne Billings from [facility] called to request verbal orders for home PT eval and 2 times x 2 weeks.  VO given.

## 2017-11-12 ENCOUNTER — Other Ambulatory Visit: Payer: Self-pay

## 2017-11-12 ENCOUNTER — Encounter: Payer: Self-pay | Admitting: Urgent Care

## 2017-11-12 ENCOUNTER — Ambulatory Visit (INDEPENDENT_AMBULATORY_CARE_PROVIDER_SITE_OTHER): Payer: Medicare HMO | Admitting: Urgent Care

## 2017-11-12 ENCOUNTER — Telehealth: Payer: Self-pay | Admitting: Cardiovascular Disease

## 2017-11-12 VITALS — BP 124/77 | HR 68 | Temp 98.2°F | Resp 18 | Ht 66.0 in | Wt 187.8 lb

## 2017-11-12 DIAGNOSIS — Z8673 Personal history of transient ischemic attack (TIA), and cerebral infarction without residual deficits: Secondary | ICD-10-CM

## 2017-11-12 DIAGNOSIS — R0602 Shortness of breath: Secondary | ICD-10-CM

## 2017-11-12 DIAGNOSIS — I10 Essential (primary) hypertension: Secondary | ICD-10-CM | POA: Diagnosis not present

## 2017-11-12 DIAGNOSIS — E876 Hypokalemia: Secondary | ICD-10-CM | POA: Diagnosis not present

## 2017-11-12 DIAGNOSIS — I3139 Other pericardial effusion (noninflammatory): Secondary | ICD-10-CM

## 2017-11-12 DIAGNOSIS — I119 Hypertensive heart disease without heart failure: Secondary | ICD-10-CM | POA: Diagnosis not present

## 2017-11-12 DIAGNOSIS — Z951 Presence of aortocoronary bypass graft: Secondary | ICD-10-CM

## 2017-11-12 DIAGNOSIS — R9389 Abnormal findings on diagnostic imaging of other specified body structures: Secondary | ICD-10-CM | POA: Diagnosis not present

## 2017-11-12 DIAGNOSIS — I313 Pericardial effusion (noninflammatory): Secondary | ICD-10-CM

## 2017-11-12 MED ORDER — FLUTICASONE PROPIONATE 50 MCG/ACT NA SUSP: 2.0000 | Freq: Every day | NASAL | 11 refills | Status: DC

## 2017-11-12 MED ORDER — LEVOCETIRIZINE DIHYDROCHLORIDE 5 MG PO TABS: 2.5000 mg | ORAL_TABLET | Freq: Every evening | ORAL | 3 refills | Status: DC

## 2017-11-12 MED ORDER — CARVEDILOL 12.5 MG PO TABS: 12.5000 mg | ORAL_TABLET | Freq: Two times a day (BID) | ORAL | 3 refills | Status: DC

## 2017-11-12 MED ORDER — HYDROCHLOROTHIAZIDE 25 MG PO TABS: 25.0000 mg | ORAL_TABLET | Freq: Every day | ORAL | 3 refills | Status: DC

## 2017-11-12 MED ORDER — ROSUVASTATIN CALCIUM 40 MG PO TABS: 40.0000 mg | ORAL_TABLET | Freq: Every day | ORAL | 3 refills | Status: DC

## 2017-11-12 MED ORDER — CLOPIDOGREL BISULFATE 75 MG PO TABS: 75.0000 mg | ORAL_TABLET | Freq: Every day | ORAL | 3 refills | Status: DC

## 2017-11-12 MED ORDER — VALSARTAN 160 MG PO TABS: 160.0000 mg | ORAL_TABLET | Freq: Every day | ORAL | 3 refills | Status: DC

## 2017-11-12 NOTE — Progress Notes (Signed)
MRN: [id number] DOB: 09-14-1940  Subjective:   [name] is a 77 y.o. male presenting for follow up on syncope, abnormal chest CT.  Patient was recently seen in the hospital and had negative work-up except for the chest CT.  It showed that he had a small pericardial effusion and chronic small chest PE.  Patient has not had a recurrence of his symptoms.  Denies chest pain, dizziness, confusion, heart racing, palpitations, shortness of breath, nausea, vomiting, belly pain.  Patient presents with his son and his wife who states that the patient never drinks water.  He does try to keep up with his medications.  He has not had follow-up with his cardiologist.  reports that he has never smoked. He has never used smokeless tobacco. He reports that he does not drink alcohol or use drugs.   [name] has a current medication list which includes the following prescription(s): acetaminophen, aspirin ec, carvedilol, clopidogrel, hydrochlorothiazide, rosuvastatin, and valsartan. Also is allergic to lipitor [atorvastatin]; morphine and related; and pork-derived products.  [name]  has a past medical history of Blurred vision (05/17/2016), CAD (coronary artery disease), native coronary artery (06/14/2008), Chest pain (04/16/2012), Diplopia, Essential hypertension (05/17/2016), Hyperlipidemia with target low density lipoprotein (LDL) cholesterol less than 70 mg/dL (06/14/2008), Hypertension, Hypertensive heart disease (06/14/2008), Hypertensive urgency, malignant (04/16/2012), Hypokalemia (04/14/2017), Internal hemorrhoids (04/19/2012), Ischemic stroke ([place]) (05/02/2016), Non-ST elevation (NSTEMI) myocardial infarction [facility]), pandiverticulosis (04/22/2012), Polycythemia vera(238.4) (04/17/2012), S/P CABG x 4 (04/17/2017), Syncope and collapse (04/17/2012), and Vertigo. Also  has a past surgical history that includes Coronary stent placement; LEFT HEART CATH AND CORONARY ANGIOGRAPHY (N/A, 04/16/2017); Coronary artery bypass graft (N/A,  04/17/2017); and TEE without cardioversion (N/A, 04/17/2017).  Objective:   Vitals: BP 124/77   Pulse 68   Temp 98.2 F (36.8 C) (Oral)   Resp 18   Ht 5\' 6"  (1.676 m)   Wt 187 lb 12.8 oz (85.2 kg)   SpO2 95%   BMI 30.31 kg/m   BP Readings from Last 3 Encounters:  11/12/17 124/77  11/05/17 (!) 141/96  10/28/17 114/69    Physical Exam  Constitutional: He is oriented to person, place, and time. He appears well-developed and well-nourished.  Cardiovascular: Normal rate, regular rhythm, normal heart sounds and intact distal pulses. Exam reveals no gallop and no friction rub.  No murmur heard. Pulmonary/Chest: Effort normal and breath sounds normal. No stridor. No respiratory distress. He has no wheezes. He has no rales.  Abdominal: Soft. Bowel sounds are normal. He exhibits no distension and no mass. There is no tenderness. There is no rebound and no guarding.  Neurological: He is alert and oriented to person, place, and time.  Skin: Skin is warm and dry.   Assessment and Plan :   Hypokalemia - Plan: Potassium  Essential hypertension - Plan: carvedilol (COREG) 12.5 MG tablet  S/P CABG x 4  Hypertensive heart disease without heart failure  History of stroke  Abnormal chest CT  Shortness of breath - Plan: Brain natriuretic peptide  Labs pending, patient is very stable in clinic today.  I will have him set up a follow-up appointment with his cardiologist.  He requested refills of all his medications today.  Strict ER return to clinic precautions reviewed with patient.  He is otherwise to make lifestyle modifications including adequate hydration every day.  Counseled that this may have been a source of his dizziness given negative work-up in the ER.  Jaynee Eagles, PA-C [facility] [phone] 11/12/2017 3:41 PM

## 2017-11-12 NOTE — Telephone Encounter (Signed)
New message:      Pt's son is calling and would like the pt to see Burt Knack for  ER f/u. He does not want to see the PA due to him just having a bypass and then having to go back again.

## 2017-11-12 NOTE — Patient Instructions (Addendum)
  [facility] [address] [place], [place] [address]  [phone] I'll be starting in October 2019.     If you have lab work done today you will be contacted with your lab results within the next 2 weeks.  If you have not heard from [place] then please contact us. The fastest way to get your results is to register for My Chart.   IF you received an x-ray today, you will receive an invoice from [facility]. Please contact [facility] at [phone] with questions or concerns regarding your invoice.   IF you received labwork today, you will receive an invoice from [facility]. Please contact [facility] at [phone] with questions or concerns regarding your invoice.   Our billing staff will not be able to assist you with questions regarding bills from these companies.  You will be contacted with the lab results as soon as they are available. The fastest way to get your results is to activate your My Chart account. Instructions are located on the last page of this paperwork. If you have not heard from [place] regarding the results in 2 weeks, please contact this office.

## 2017-11-13 ENCOUNTER — Encounter: Payer: Self-pay | Admitting: Urgent Care

## 2017-11-13 DIAGNOSIS — I2510 Atherosclerotic heart disease of native coronary artery without angina pectoris: Secondary | ICD-10-CM | POA: Diagnosis not present

## 2017-11-13 DIAGNOSIS — I119 Hypertensive heart disease without heart failure: Secondary | ICD-10-CM | POA: Diagnosis not present

## 2017-11-13 DIAGNOSIS — I259 Chronic ischemic heart disease, unspecified: Secondary | ICD-10-CM | POA: Diagnosis not present

## 2017-11-13 DIAGNOSIS — D45 Polycythemia vera: Secondary | ICD-10-CM | POA: Diagnosis not present

## 2017-11-13 DIAGNOSIS — R55 Syncope and collapse: Secondary | ICD-10-CM | POA: Diagnosis not present

## 2017-11-13 DIAGNOSIS — I700 Atherosclerosis of aorta: Secondary | ICD-10-CM | POA: Diagnosis not present

## 2017-11-13 DIAGNOSIS — K449 Diaphragmatic hernia without obstruction or gangrene: Secondary | ICD-10-CM | POA: Diagnosis not present

## 2017-11-13 DIAGNOSIS — I313 Pericardial effusion (noninflammatory): Secondary | ICD-10-CM | POA: Diagnosis not present

## 2017-11-13 DIAGNOSIS — H532 Diplopia: Secondary | ICD-10-CM | POA: Diagnosis not present

## 2017-11-13 LAB — BRAIN NATRIURETIC PEPTIDE: BNP: 181.7 pg/mL — ABNORMAL HIGH (ref 0.0–100.0)

## 2017-11-13 LAB — POTASSIUM: Potassium: 4.4 mmol/L (ref 3.5–5.2)

## 2017-11-13 NOTE — Telephone Encounter (Signed)
Received note from PCP. Patient needs post hospital follow up. He was admitted with vasovagal syncope. Chest CT suggested small pericardial effusion.  There was also a questionable chronic pulmonary embolism noted on CT.  This was not felt to be significant by the attending service and anticoagulation was not started.   Previous call from family indicates he does not want to see PA. PLAN:  1. Please try to arrange follow up with Dr. Burt Knack. 2. He should have an echocardiogram prior to his visit.  Ok to use pericardial effusion as Dx. Richardson Dopp, PA-C    11/13/2017 4:34 PM

## 2017-11-13 NOTE — Telephone Encounter (Signed)
-----   Message from Jaynee Eagles, [place] sent at 11/12/2017  4:02 PM EDT ----- Brigid Re,  We have a mutual patient that I'd like to get in with you for a recheck as he was recently hospitalized for syncope, had a small cardiac effusion. He is endorsing mild shob but no chest pain, heart racing. Can you get him in?

## 2017-11-13 NOTE — Telephone Encounter (Signed)
Called and discussed echo and appointment needs in great detail with the patient and his wife. They both agree to have echo 10/2 and appointment with SW 10/8.  They were grateful for call and agree with treatment plan.

## 2017-11-15 ENCOUNTER — Telehealth: Payer: Self-pay | Admitting: Cardiovascular Disease

## 2017-11-15 DIAGNOSIS — I119 Hypertensive heart disease without heart failure: Secondary | ICD-10-CM | POA: Diagnosis not present

## 2017-11-15 DIAGNOSIS — I2510 Atherosclerotic heart disease of native coronary artery without angina pectoris: Secondary | ICD-10-CM | POA: Diagnosis not present

## 2017-11-15 DIAGNOSIS — I259 Chronic ischemic heart disease, unspecified: Secondary | ICD-10-CM | POA: Diagnosis not present

## 2017-11-15 DIAGNOSIS — R55 Syncope and collapse: Secondary | ICD-10-CM | POA: Diagnosis not present

## 2017-11-15 DIAGNOSIS — I313 Pericardial effusion (noninflammatory): Secondary | ICD-10-CM | POA: Diagnosis not present

## 2017-11-15 DIAGNOSIS — D45 Polycythemia vera: Secondary | ICD-10-CM | POA: Diagnosis not present

## 2017-11-15 DIAGNOSIS — K449 Diaphragmatic hernia without obstruction or gangrene: Secondary | ICD-10-CM | POA: Diagnosis not present

## 2017-11-15 DIAGNOSIS — H532 Diplopia: Secondary | ICD-10-CM | POA: Diagnosis not present

## 2017-11-15 DIAGNOSIS — I700 Atherosclerosis of aorta: Secondary | ICD-10-CM | POA: Diagnosis not present

## 2017-11-15 NOTE — Telephone Encounter (Signed)
Follow Up: ° ° °Pt is returning your call from today.. °

## 2017-11-15 NOTE — Telephone Encounter (Signed)
Attempted to return call - I did not call the patient today and I do not see documentation anyone from the office did. Unable to leave message to inform patient of this as no VM picked up after several rings.

## 2017-11-18 DIAGNOSIS — R55 Syncope and collapse: Secondary | ICD-10-CM | POA: Diagnosis not present

## 2017-11-18 DIAGNOSIS — I259 Chronic ischemic heart disease, unspecified: Secondary | ICD-10-CM | POA: Diagnosis not present

## 2017-11-18 DIAGNOSIS — I313 Pericardial effusion (noninflammatory): Secondary | ICD-10-CM | POA: Diagnosis not present

## 2017-11-18 DIAGNOSIS — I119 Hypertensive heart disease without heart failure: Secondary | ICD-10-CM | POA: Diagnosis not present

## 2017-11-18 DIAGNOSIS — H532 Diplopia: Secondary | ICD-10-CM | POA: Diagnosis not present

## 2017-11-18 DIAGNOSIS — D45 Polycythemia vera: Secondary | ICD-10-CM | POA: Diagnosis not present

## 2017-11-18 DIAGNOSIS — I2510 Atherosclerotic heart disease of native coronary artery without angina pectoris: Secondary | ICD-10-CM | POA: Diagnosis not present

## 2017-11-18 DIAGNOSIS — I700 Atherosclerosis of aorta: Secondary | ICD-10-CM | POA: Diagnosis not present

## 2017-11-18 DIAGNOSIS — K449 Diaphragmatic hernia without obstruction or gangrene: Secondary | ICD-10-CM | POA: Diagnosis not present

## 2017-11-20 ENCOUNTER — Ambulatory Visit (HOSPITAL_COMMUNITY): Payer: Medicare HMO | Attending: Cardiology

## 2017-11-20 ENCOUNTER — Other Ambulatory Visit: Payer: Self-pay

## 2017-11-20 DIAGNOSIS — I119 Hypertensive heart disease without heart failure: Secondary | ICD-10-CM | POA: Insufficient documentation

## 2017-11-20 DIAGNOSIS — I2510 Atherosclerotic heart disease of native coronary artery without angina pectoris: Secondary | ICD-10-CM | POA: Insufficient documentation

## 2017-11-20 DIAGNOSIS — I3139 Other pericardial effusion (noninflammatory): Secondary | ICD-10-CM

## 2017-11-20 DIAGNOSIS — Z8673 Personal history of transient ischemic attack (TIA), and cerebral infarction without residual deficits: Secondary | ICD-10-CM | POA: Diagnosis not present

## 2017-11-20 DIAGNOSIS — Z951 Presence of aortocoronary bypass graft: Secondary | ICD-10-CM | POA: Insufficient documentation

## 2017-11-20 DIAGNOSIS — I252 Old myocardial infarction: Secondary | ICD-10-CM | POA: Diagnosis not present

## 2017-11-20 DIAGNOSIS — I059 Rheumatic mitral valve disease, unspecified: Secondary | ICD-10-CM | POA: Diagnosis not present

## 2017-11-20 DIAGNOSIS — E785 Hyperlipidemia, unspecified: Secondary | ICD-10-CM | POA: Diagnosis not present

## 2017-11-20 DIAGNOSIS — I313 Pericardial effusion (noninflammatory): Secondary | ICD-10-CM | POA: Diagnosis not present

## 2017-11-21 ENCOUNTER — Encounter: Payer: Self-pay | Admitting: Physician Assistant

## 2017-11-21 DIAGNOSIS — I119 Hypertensive heart disease without heart failure: Secondary | ICD-10-CM | POA: Diagnosis not present

## 2017-11-21 DIAGNOSIS — I2510 Atherosclerotic heart disease of native coronary artery without angina pectoris: Secondary | ICD-10-CM | POA: Diagnosis not present

## 2017-11-21 DIAGNOSIS — I700 Atherosclerosis of aorta: Secondary | ICD-10-CM | POA: Diagnosis not present

## 2017-11-21 DIAGNOSIS — R55 Syncope and collapse: Secondary | ICD-10-CM | POA: Diagnosis not present

## 2017-11-21 DIAGNOSIS — H532 Diplopia: Secondary | ICD-10-CM | POA: Diagnosis not present

## 2017-11-21 DIAGNOSIS — I313 Pericardial effusion (noninflammatory): Secondary | ICD-10-CM | POA: Diagnosis not present

## 2017-11-21 DIAGNOSIS — D45 Polycythemia vera: Secondary | ICD-10-CM | POA: Diagnosis not present

## 2017-11-21 DIAGNOSIS — I259 Chronic ischemic heart disease, unspecified: Secondary | ICD-10-CM | POA: Diagnosis not present

## 2017-11-21 DIAGNOSIS — K449 Diaphragmatic hernia without obstruction or gangrene: Secondary | ICD-10-CM | POA: Diagnosis not present

## 2017-11-25 NOTE — Progress Notes (Signed)
Cardiology Office Note:    Date:  11/26/2017   ID:  [name], DOB 24-Aug-1940, MRN [id number]  PCP:  Horald Pollen, MD  Cardiologist:  Sherren Mocha, MD   Electrophysiologist:  None   Referring MD: Jaynee Eagles, PA-C   Chief Complaint  Patient presents with  . Hospitalization Follow-up    syncope     History of Present Illness:    [name] is a 77 y.o. male with coronary artery disease s/p prior BMS to the LCx and OM in 2008 and NSTEMI in 03/2017 followed by CABG, hypertension, hyperlipidemia, prior stroke.  He was last seen in 07/2017.  He was admitted in 10/2017 with vasovagal syncope.  Of note, a chest CT demonstrated a small RLL filling defect felt to represent a small chronic pulmonary embolism.  However, he had no DVT on venous US and no pulmonary hypertension.  It was felt to possibly be a false positive.  There was also a suggestion of a pericardial effusion on chest CT.  A follow up echo showed a small posterior effusion.    [name] returns for follow-up.  He is here today with his wife.  He has not had any further syncope or near syncope.  He notes working outside in extreme heat on the day that he passed out.  He has not had any palpitations.  He has not had any chest pain or significant shortness of breath.  He has not had orthopnea, paroxysmal nocturnal dyspnea.  He has had mild right lower extremity swelling since his bypass surgery.  Prior CV studies:   The following studies were reviewed today:  Echo 11/20/17 Mod LVH, EF 55-60, no RWMA, Gr 1 DD, trivial AI, MAC, mod LAE, small post eff  Venous Duplex 11/05/17 Final Interpretation: Right: There is no evidence of deep vein thrombosis in the lower extremity. No cystic structure found in the popliteal fossa. Left: There is no evidence of deep vein thrombosis in the lower extremity. No cystic structure found in the popliteal fossa.  Chest CTA 11/04/17 IMPRESSION: 1. Small web-like subsegmental  filling defect in the right lower lobe posterior basal segment pulmonary artery is most compatible with a small focus of chronic pulmonary embolus. No findings for acute pulmonary embolus. 2. Aortic Atherosclerosis (ICD10-I70.0). Coronary atherosclerosis and prior CABG. 3. Small posterior pericardial effusion. 4. Small to moderate type 1 hiatal hernia. 5. Mild lingular atelectasis or scarring.  Cardiac Catheterization 04/16/17  -The left ventricular systolic function is normal.  LV end diastolic pressure is mildly elevated.  The left ventricular ejection fraction is 55-65% by visual estimate.  Prox RCA lesion is 50% stenosed.  Mid RCA lesion is 95% stenosed.  Dist RCA lesion is 50% stenosed with 90% stenosed side branch in Ost RPDA.  Dist LM to Ost LAD lesion is 90% stenosed.  Ost 1st Diag lesion is 100% stenosed.  Prox LAD lesion is 85% stenosed.  Mid LAD lesion is 80% stenosed.  Ost 1st Mrg to 1st Mrg lesion is 80% stenosed.  Ost Cx lesion is 90% stenosed.  Prox Cx lesion is 50% stenosed. 1.  Critical left main and multivessel coronary artery disease with severe  proximal LAD stenosis, severe ostial left circumflex stenosis and severe  in-stent restenosis, and severe mid RCA stenosis 2.  Normal LV systolic function with mildly elevated LVEDP  Carotid US 04/16/2017 R ICA 1-39; L ICA 1-39   Echo 05/03/16 Moderate concentric LVH, EF 60-65, normal wall motion, grade 1 diastolic dysfunction,  mild AI, mildly dilated aortic root (40 mm),trivial MR/TR  [facility] 06/2010 LAD ostial 50, proximal 80, distal 60-70 LCx small AV groove 99; OM1 70-80 proximal and 90 distal RCA 30 proximal; PDA ostial 90 EF 70 PCI: BMS to the OM and BMS to the LCx  Past Medical History:  Diagnosis Date  . Blurred vision 05/17/2016  . CAD (coronary artery disease), native coronary artery 06/14/2008   a. BMS to LCx & OM 2008 b. 03/2017 CABG x 4 (LIMA to LAD, SVG to diag 1, SVG to distal Circ, SVG to  PDA)  . Chest pain 04/16/2012  . Diplopia   . Echocardiogram    Echo 10/19: mod LVH, EF 55-60, no RWMA, Gr 1 DD, trivial AI, MAC, mod LAE, prob small post effusion  . Essential hypertension 05/17/2016  . Hyperlipidemia with target low density lipoprotein (LDL) cholesterol less than 70 mg/dL 06/14/2008   Qualifier: Diagnosis of  By: Mare Ferrari, RMA, [name]    . Hypertension   . Hypertensive heart disease 06/14/2008   Qualifier: Diagnosis of  By: Mare Ferrari, [place], [name]    . Hypertensive urgency, malignant 04/16/2012  . Hypokalemia 04/14/2017  . Internal hemorrhoids 04/19/2012  . Ischemic stroke ([place]) 05/02/2016  . Non-ST elevation (NSTEMI) myocardial infarction ([place])   . pandiverticulosis 04/22/2012   12/17/2011. [place]. Juanita Craver MD. Colonoscopy. Moderate sized internal hemorrhoids and extensive pandiverticulosis. Repeat 5 years.    . Polycythemia vera(238.4) 04/17/2012  . S/P CABG x 4 04/17/2017  . Syncope and collapse 04/17/2012   Pt syncopized while sitting in bed giving history @ time of admission to hospital Tachycardic (appeared sinus) to 120s-130s and hypotensive to 50s/30s.  Unresponsive initially >> Spontaneously resolved after 2-3 minutes >> return to baseline ~30 minutes   . Vertigo    Surgical Hx: The patient  has a past surgical history that includes Coronary stent placement; LEFT HEART CATH AND CORONARY ANGIOGRAPHY (N/A, 04/16/2017); Coronary artery bypass graft (N/A, 04/17/2017); and TEE without cardioversion (N/A, 04/17/2017).   Current Medications: Current Meds  Medication Sig  . acetaminophen (TYLENOL) 325 MG tablet Take 2 tablets (650 mg total) by mouth every 6 (six) hours as needed for mild pain.  Marland Kitchen aspirin EC 81 MG tablet Take 81 mg by mouth daily.  . carvedilol (COREG) 12.5 MG tablet Take 1 tablet (12.5 mg total) by mouth 2 (two) times daily.  . clopidogrel (PLAVIX) 75 MG tablet Take 1 tablet (75 mg total) by mouth daily.  . fluticasone (FLONASE) 50 MCG/ACT nasal  spray Place 2 sprays into both nostrils daily.  . hydrochlorothiazide (HYDRODIURIL) 25 MG tablet Take 1 tablet (25 mg total) by mouth daily.  . rosuvastatin (CRESTOR) 40 MG tablet Take 1 tablet (40 mg total) by mouth daily. Dose change  . valsartan (DIOVAN) 160 MG tablet Take 1 tablet (160 mg total) by mouth daily. Dose change     Allergies:   Lipitor [atorvastatin]; Morphine and related; and Pork-derived products   Social History   Tobacco Use  . Smoking status: Never Smoker  . Smokeless tobacco: Never Used  Substance Use Topics  . Alcohol use: No    Alcohol/week: 0.0 standard drinks  . Drug use: No     Family Hx: The patient's family history includes Cancer in his sister; Heart disease in his mother.  ROS:   Please see the history of present illness.    ROS All other systems reviewed and are negative.   EKGs/Labs/Other Test Reviewed:    EKG:  EKG is  ordered today.  The ekg ordered today demonstrates normal sinus rhythm, heart rate 70, normal axis, T wave inversions 1, aVL, V5-V6, QTC 421  Recent Labs: 11/04/2017: ALT 13 11/05/2017: BUN 12; Creatinine, Ser 0.84; Hemoglobin 13.6; Magnesium 2.1; Platelets 202; Sodium 137 11/12/2017: BNP 181.7; Potassium 4.4   Recent Lipid Panel Lab Results  Component Value Date/Time   CHOL 101 05/29/2017 10:13 AM   TRIG 84 05/29/2017 10:13 AM   HDL 35 (L) 05/29/2017 10:13 AM   CHOLHDL 2.9 05/29/2017 10:13 AM   CHOLHDL 4.5 04/15/2017 02:41 AM   LDLCALC 49 05/29/2017 10:13 AM   LDLDIRECT 150.8 06/30/2008 10:07 AM    Physical Exam:    VS:  BP 132/90   Pulse 66   Ht _0  (1.676 m)   Wt 186 lb (84.4 kg)   SpO2 98%   BMI 30.02 kg/m     Wt Readings from Last 3 Encounters:  11/26/17 186 lb (84.4 kg)  11/12/17 187 lb 12.8 oz (85.2 kg)  11/04/17 184 lb 11.9 oz (83.8 kg)     Physical Exam  Constitutional: He is oriented to person, place, and time. He appears well-developed and well-nourished. No distress.  HENT:  Head:  Normocephalic and atraumatic.  Eyes: No scleral icterus.  Neck: No JVD present. No thyromegaly present.  Cardiovascular: Normal rate and regular rhythm. Exam reveals no friction rub.  Pulmonary/Chest: Effort normal. He has no rales.  Abdominal: Soft.  Musculoskeletal: He exhibits edema (very trace RLE edema).  Lymphadenopathy:    He has no cervical adenopathy.  Neurological: He is alert and oriented to person, place, and time.  Skin: Skin is warm and dry.  Psychiatric: He has a normal mood and affect.    ASSESSMENT & PLAN:    Syncope, unspecified syncope type He presented to the hospital last month with syncope.  This seems to have been vasovagal syncope related to exposure to extreme heat.  Recent echocardiogram demonstrated normal LV function.  No further work-up is needed.  I have cautioned him to avoid extreme temperatures as much as possible.  Coronary artery disease involving native coronary artery of native heart without angina pectoris History of remote PCI.  He is status post myocardial infarction in February 2019 followed by CABG.  He is not having any symptoms of angina.  Continue aspirin, clopidogrel, beta-blocker, rosuvastatin, ARB.  Follow-up with Dr. Burt Knack in February 2020.  Essential hypertension  Blood pressure somewhat above target today.  He has not taken any medications yet.  His pressures are usually optimal at home.  Hyperlipidemia with target low density lipoprotein (LDL) cholesterol less than 70 mg/dL LDL optimal on most recent lab work.  Continue current Rx.    Pericardial effusion I suspect the small pericardial effusion is related to his bypass surgery.  I will repeat a limited echocardiogram in 4 to 6 weeks to ensure that it is not enlarging.  - Limited echo to recheck effusion in 4-6 weeks.   Dispo:  Return in about 4 months (around 03/29/2018) for Routine Follow Up, w/ Dr. Burt Knack.   Medication Adjustments/Labs and Tests Ordered: Current medicines are  reviewed at length with the patient today.  Concerns regarding medicines are outlined above.  Tests Ordered: Orders Placed This Encounter  Procedures  . EKG 12-Lead  . ECHOCARDIOGRAM LIMITED   Medication Changes: No orders of the defined types were placed in this encounter.   Signed, Richardson Dopp, PA-C  11/26/2017 9:34 AM    [facility] [address], [place], [place]  [address] Phone: [phone]; Fax: [phone]

## 2017-11-25 NOTE — Telephone Encounter (Signed)
Patient has OV tomorrow with Richardson Dopp.

## 2017-11-26 ENCOUNTER — Encounter: Payer: Self-pay | Admitting: Physician Assistant

## 2017-11-26 ENCOUNTER — Ambulatory Visit: Payer: Medicare HMO | Admitting: Physician Assistant

## 2017-11-26 ENCOUNTER — Telehealth: Payer: Self-pay | Admitting: *Deleted

## 2017-11-26 VITALS — BP 132/90 | HR 66 | Ht 66.0 in | Wt 186.0 lb

## 2017-11-26 DIAGNOSIS — R55 Syncope and collapse: Secondary | ICD-10-CM

## 2017-11-26 DIAGNOSIS — I313 Pericardial effusion (noninflammatory): Secondary | ICD-10-CM

## 2017-11-26 DIAGNOSIS — I2510 Atherosclerotic heart disease of native coronary artery without angina pectoris: Secondary | ICD-10-CM

## 2017-11-26 DIAGNOSIS — E785 Hyperlipidemia, unspecified: Secondary | ICD-10-CM | POA: Diagnosis not present

## 2017-11-26 DIAGNOSIS — I10 Essential (primary) hypertension: Secondary | ICD-10-CM | POA: Diagnosis not present

## 2017-11-26 DIAGNOSIS — I3139 Other pericardial effusion (noninflammatory): Secondary | ICD-10-CM

## 2017-11-26 NOTE — Patient Instructions (Signed)
Medication Instructions:  1. Your physician recommends that you continue on your current medications as directed. Please refer to the Current Medication list given to you today.  If you need a refill on your cardiac medications before your next appointment, please call your pharmacy.   Lab work: NONE ORDERED TODAY If you have labs (blood work) drawn today and your tests are completely normal, you will receive your results only by: Marland Kitchen MyChart Message (if you have MyChart) OR . A paper copy in the mail If you have any lab test that is abnormal or we need to change your treatment, we will call you to review the results.  Testing/Procedures: Your physician has requested that you have an LIMITED echocardiogram THIS IS TO BE DONE IN 4-6 WEEKS TO RECHECK PERICARDIAL EFFUSION.  Echocardiography is a painless test that uses sound waves to create images of your heart. It provides your doctor with information about the size and shape of your heart and how well your heart's chambers and valves are working. This procedure takes approximately one hour. There are no restrictions for this procedure.    Follow-Up: At [facility], you and your health needs are our priority.  As part of our continuing mission to provide you with exceptional heart care, we have created designated Provider Care Teams.  These Care Teams include your primary Cardiologist (physician) and Advanced Practice Providers (APPs -  Physician Assistants and Nurse Practitioners) who all work together to provide you with the care you need, when you need it. You will need a follow up appointment in:  5 months.  Please call our office 2 months in advance to schedule this appointment.  You may see Sherren Mocha, MD or one of the following Advanced Practice Providers on your designated Care Team: Richardson Dopp, PA-C [place], [place] . Daune Perch, NP  Any Other Special Instructions Will Be Listed Below (If Applicable).

## 2017-11-26 NOTE — Telephone Encounter (Signed)
Faxed signed order to ATTN: Ruthe Mannan at [facility] and confirmation page received at 8:21 am.

## 2017-12-26 ENCOUNTER — Telehealth: Payer: Self-pay | Admitting: Cardiovascular Disease

## 2017-12-26 NOTE — Telephone Encounter (Signed)
Patient's wife called to see if a repeat echo needs to be done since he had one 10/2. Per [employer] 10/8 OV note,  "Pericardial effusion I suspect the small pericardial effusion is related to his bypass surgery.  I will repeat a limited echocardiogram in 4 to 6 weeks to ensure that it is not enlarging.             - Limited echo to recheck effusion in 4-6 weeks."  Reiterated to her that the echo needs to be completed to reassess effusion. She was grateful for call and agrees with treatment plan.

## 2017-12-26 NOTE — Telephone Encounter (Signed)
New Message:   Patient calling because he would like to know if he stills needs this appt. He just a test 10/19. Please call back

## 2017-12-31 ENCOUNTER — Ambulatory Visit (HOSPITAL_COMMUNITY): Payer: Medicare HMO | Attending: Cardiology

## 2017-12-31 ENCOUNTER — Other Ambulatory Visit: Payer: Self-pay

## 2017-12-31 DIAGNOSIS — I313 Pericardial effusion (noninflammatory): Secondary | ICD-10-CM | POA: Diagnosis not present

## 2017-12-31 DIAGNOSIS — I3139 Other pericardial effusion (noninflammatory): Secondary | ICD-10-CM

## 2018-01-02 ENCOUNTER — Encounter: Payer: Self-pay | Admitting: Physician Assistant

## 2018-01-02 ENCOUNTER — Telehealth: Payer: Self-pay

## 2018-01-02 DIAGNOSIS — I3139 Other pericardial effusion (noninflammatory): Secondary | ICD-10-CM

## 2018-01-02 DIAGNOSIS — I313 Pericardial effusion (noninflammatory): Secondary | ICD-10-CM

## 2018-01-02 NOTE — Telephone Encounter (Signed)
Reviewed results with patient's wife (DPR on file) who verbalized understanding. Per Nicki Reaper wants to repeat Limited Echo in 3 months. Orders have be placed in Epic and a copy is sent to PCP.

## 2018-01-22 ENCOUNTER — Telehealth: Payer: Self-pay | Admitting: Cardiovascular Disease

## 2018-01-22 NOTE — Telephone Encounter (Signed)
New Message:     Daughter wants to know when is pt supposed to have his next appt with Dr Acie Fredrickson please?

## 2018-01-22 NOTE — Telephone Encounter (Signed)
Dr. Antionette Char patient Called patient to give her information on next appointment date and wife states patient seems to be getting worse instead of better. She states patient is SOB and I asked her to clarify if this is worse since last ov with Richardson Dopp, PA. Wife states she does not know how to explain it except patient has no energy and is SOB. She asked if he can see Dr. Burt Knack sooner than February. I advised I will forward message to Dr. Burt Knack for review and someone from our office will call back with his advice. She thanked me for the call.

## 2018-01-23 NOTE — Telephone Encounter (Signed)
I'd be fine to see him sooner. Please add him onto one of my structural heart days at the end of the day. thanks

## 2018-01-30 NOTE — Telephone Encounter (Signed)
Confirmed appointment with patient's wife.

## 2018-01-30 NOTE — Telephone Encounter (Signed)
Spoke with patient's son. Informed him Dr. Burt Knack is able to see the patient tomorrow at 1620.  Instructed him to have the patient call back to confirm appointment since he was unavailable and his phone number was not working.

## 2018-01-31 ENCOUNTER — Ambulatory Visit: Payer: Medicare HMO | Admitting: Cardiovascular Disease

## 2018-01-31 ENCOUNTER — Encounter: Payer: Self-pay | Admitting: Cardiovascular Disease

## 2018-01-31 VITALS — BP 142/78 | HR 65 | Ht 66.0 in | Wt 189.8 lb

## 2018-01-31 DIAGNOSIS — I313 Pericardial effusion (noninflammatory): Secondary | ICD-10-CM

## 2018-01-31 DIAGNOSIS — I5033 Acute on chronic diastolic (congestive) heart failure: Secondary | ICD-10-CM

## 2018-01-31 DIAGNOSIS — I2510 Atherosclerotic heart disease of native coronary artery without angina pectoris: Secondary | ICD-10-CM

## 2018-01-31 DIAGNOSIS — I3139 Other pericardial effusion (noninflammatory): Secondary | ICD-10-CM

## 2018-01-31 MED ORDER — CLOPIDOGREL BISULFATE 75 MG PO TABS: 75.0000 mg | ORAL_TABLET | Freq: Every day | ORAL | 3 refills | Status: DC

## 2018-01-31 MED ORDER — POTASSIUM CHLORIDE CRYS ER 20 MEQ PO TBCR: 20.0000 meq | EXTENDED_RELEASE_TABLET | Freq: Every day | ORAL | 3 refills | Status: DC

## 2018-01-31 MED ORDER — FUROSEMIDE 40 MG PO TABS: 40.0000 mg | ORAL_TABLET | Freq: Every day | ORAL | 3 refills | Status: DC

## 2018-01-31 MED ORDER — ROSUVASTATIN CALCIUM 40 MG PO TABS: 40.0000 mg | ORAL_TABLET | Freq: Every day | ORAL | 3 refills | Status: DC

## 2018-01-31 NOTE — Patient Instructions (Addendum)
Medication Instructions:  1) STOP HCTZ (hydrochlorothiazide)  2) Take Lasix 80 mg TODAY then start Lasix 40 mg daily tomorrow 3) START KDUR 20 meq daily (starting TODAY)  Labwork: TODAY: BMET, BNP  Testing/Procedures: Dr. Burt Knack recommends you have a LIMITED ECHO. You are scheduled for your echo 02/04/18 at 2:00PM. Please arrive by 1:30PM for this appointment.   Follow-Up: You have an appointment with Dr. Antionette Char assistant, Pecolia Ades, on 02/14/2018 at 11:00AM.

## 2018-01-31 NOTE — Progress Notes (Signed)
Cardiology Office Note:    Date:  02/02/2018   ID:  [name], DOB 08-07-1940, MRN [id number]  PCP:  Horald Pollen, MD  Cardiologist:  Sherren Mocha, MD  Electrophysiologist:  None   Referring MD: Horald Pollen, *   Chief Complaint  Patient presents with  . Shortness of Breath    History of Present Illness:    [name] is a 77 y.o. male with a hx of coronary artery disease with remote stenting of the circumflex and OM branches in 2008.  He presented in February 2019 with a non-STEMI and was found to have severe multivessel disease.  He was treated with coronary bypass surgery.  He presents today for follow-up evaluation.  He has been noted to have a significant pericardial effusion on follow-up and this is been managed with serial imaging and continued clinical surveillance.  The effusion has primarily been posterior to the heart and moderate in size.  He is here with his wife and son today. They are concerned about his breathing. He has episodes of shortness of breath and nonproductive cough.  He has had more trouble with orthopnea and PND.  He has not had leg swelling.  He feels very poorly.  He denies chest pain or pressure.  He has been compliant with his medications.  Past Medical History:  Diagnosis Date  . Blurred vision 05/17/2016  . CAD (coronary artery disease), native coronary artery 06/14/2008   a. BMS to LCx & OM 2008 b. 03/2017 CABG x 4 (LIMA to LAD, SVG to diag 1, SVG to distal Circ, SVG to PDA)  . Chest pain 04/16/2012  . Diplopia   . Echocardiogram    Echo 10/19: mod LVH, EF 55-60, no RWMA, Gr 1 DD, trivial AI, MAC, mod LAE, prob small post effusion  . Essential hypertension 05/17/2016  . Hyperlipidemia with target low density lipoprotein (LDL) cholesterol less than 70 mg/dL 06/14/2008   Qualifier: Diagnosis of  By: Mare Ferrari, RMA, [name]    . Hypertension   . Hypertensive heart disease 06/14/2008   Qualifier: Diagnosis of  By: Mare Ferrari,  [place], [name]    . Hypertensive urgency, malignant 04/16/2012  . Hypokalemia 04/14/2017  . Internal hemorrhoids 04/19/2012  . Ischemic stroke ([place]) 05/02/2016  . Non-ST elevation (NSTEMI) myocardial infarction ([place])   . pandiverticulosis 04/22/2012   12/17/2011. [facility]. Juanita Craver MD. Colonoscopy. Moderate sized internal hemorrhoids and extensive pandiverticulosis. Repeat 5 years.    . Pericardial effusion    Echo 11/19: mild focal basal septal hypertrophy, EF 55-60, Gr 2 DD, trivial AI, trivial TR, trivial PI, small to mod eff post to heart - no evidence of RV collapse.>> repeat limited echo in 03/2018  . Polycythemia vera(238.4) 04/17/2012  . S/P CABG x 4 04/17/2017  . Syncope and collapse 04/17/2012   Pt syncopized while sitting in bed giving history @ time of admission to hospital Tachycardic (appeared sinus) to 120s-130s and hypotensive to 50s/30s.  Unresponsive initially >> Spontaneously resolved after 2-3 minutes >> return to baseline ~30 minutes   . Vertigo     Past Surgical History:  Procedure Laterality Date  . CORONARY ARTERY BYPASS GRAFT N/A 04/17/2017   Procedure: CORONARY ARTERY BYPASS GRAFTING (CABG) x4 , using left internal mammary artery  to LAD and right leg greater saphenous vein harvested endoscopically  to PDA, Diagonal I and Circumflex;  Surgeon: [name], MD;  Location: [facility];  Service: Open Heart Surgery;  Laterality: N/A;  . CORONARY  STENT PLACEMENT    . LEFT HEART CATH AND CORONARY ANGIOGRAPHY N/A 04/16/2017   Procedure: LEFT HEART CATH AND CORONARY ANGIOGRAPHY;  Surgeon: Sherren Mocha, MD;  Location: [facility];  Service: Cardiovascular;  Laterality: N/A;  . TEE WITHOUT CARDIOVERSION N/A 04/17/2017   Procedure: TRANSESOPHAGEAL ECHOCARDIOGRAM (TEE);  Surgeon: [name], MD;  Location: [facility];  Service: Open Heart Surgery;  Laterality: N/A;    Current Medications: Current Meds  Medication Sig  . acetaminophen (TYLENOL) 325 MG  tablet Take 2 tablets (650 mg total) by mouth every 6 (six) hours as needed for mild pain.  Marland Kitchen aspirin EC 81 MG tablet Take 81 mg by mouth daily.  . carvedilol (COREG) 12.5 MG tablet Take 1 tablet (12.5 mg total) by mouth 2 (two) times daily.  . clopidogrel (PLAVIX) 75 MG tablet Take 1 tablet (75 mg total) by mouth daily.  . rosuvastatin (CRESTOR) 40 MG tablet Take 1 tablet (40 mg total) by mouth daily. Dose change  . valsartan (DIOVAN) 160 MG tablet Take 1 tablet (160 mg total) by mouth daily. Dose change  . [DISCONTINUED] clopidogrel (PLAVIX) 75 MG tablet Take 1 tablet (75 mg total) by mouth daily.  . [DISCONTINUED] hydrochlorothiazide (HYDRODIURIL) 25 MG tablet Take 1 tablet (25 mg total) by mouth daily.  . [DISCONTINUED] rosuvastatin (CRESTOR) 40 MG tablet Take 1 tablet (40 mg total) by mouth daily. Dose change     Allergies:   Lipitor [atorvastatin]; Morphine and related; and Pork-derived products   Social History   Socioeconomic History  . Marital status: Married    Spouse name: Not on file  . Number of children: Not on file  . Years of education: Not on file  . Highest education level: Not on file  Occupational History  . Not on file  Social Needs  . Financial resource strain: Not on file  . Food insecurity:    Worry: Not on file    Inability: Not on file  . Transportation needs:    Medical: Not on file    Non-medical: Not on file  Tobacco Use  . Smoking status: Never Smoker  . Smokeless tobacco: Never Used  Substance and Sexual Activity  . Alcohol use: No    Alcohol/week: 0.0 standard drinks  . Drug use: No  . Sexual activity: Not on file  Lifestyle  . Physical activity:    Days per week: Not on file    Minutes per session: Not on file  . Stress: Not on file  Relationships  . Social connections:    Talks on phone: Not on file    Gets together: Not on file    Attends religious service: Not on file    Active member of club or organization: Not on file    Attends  meetings of clubs or organizations: Not on file    Relationship status: Not on file  Other Topics Concern  . Not on file  Social History Narrative   Lives in [place] with Wife and 2 sons.  From [place].  To [place] ~2000.     Currently retired but worked in Veterinary surgeon     Family History: The patient's family history includes Cancer in his sister; Heart disease in his mother.  ROS:   Please see the history of present illness.    Positive for generalized fatigue.  All other systems reviewed and are negative.  EKGs/Labs/Other Studies Reviewed:    The following studies were reviewed today: Echo 12-31-2017: -  Left ventricle: The cavity size was normal. There was mild focal   basal hypertrophy of the septum. Systolic function was normal.   The estimated ejection fraction was in the range of 55% to 60%.   Wall motion was normal; there were no regional wall motion   abnormalities. Features are consistent with a pseudonormal left   ventricular filling pattern, with concomitant abnormal relaxation   and increased filling pressure (grade 2 diastolic dysfunction). - Aortic valve: There was trivial regurgitation. - Mitral valve: There was no significant regurgitation. - Tricuspid valve: There was trivial regurgitation. - Pulmonic valve: There was trivial regurgitation. - Pericardium, extracardiac: A small to moderate pericardial   effusion was identified posterior to the heart. The fluid had no   internal echoes.  Impressions:  - Compared to recent study, there is a small-moderate pericardial   effusion, measuring approximately 2 cm in posterior views. No   evidence of RV diastolic collapse, but IVC mildly dilated and   does not fully collapse. There is mild respiratory variation in   mitral and tricuspid inflow velocities, but it does not suggest   hemodynamic compromise. It appears similar to prior study, though   it is more clearly imaged on today&'s study.   EKG:   EKG is not ordered today.    Recent Labs: 11/04/2017: ALT 13 11/05/2017: Hemoglobin 13.6; Magnesium 2.1; Platelets 202 11/12/2017: BNP 181.7 01/31/2018: BUN 14; Creatinine, Ser 0.77; NT-Pro BNP 405; Potassium 4.2; Sodium 139  Recent Lipid Panel    Component Value Date/Time   CHOL 101 05/29/2017 1013   TRIG 84 05/29/2017 1013   HDL 35 (L) 05/29/2017 1013   CHOLHDL 2.9 05/29/2017 1013   CHOLHDL 4.5 04/15/2017 0241   VLDL 9 04/15/2017 0241   LDLCALC 49 05/29/2017 1013   LDLDIRECT 150.8 06/30/2008 1007    Physical Exam:    VS:  BP (!) 142/78   Pulse 65   Ht 5' 6"  (1.676 m)   Wt 189 lb 12.8 oz (86.1 kg)   SpO2 98%   BMI 30.63 kg/m     Wt Readings from Last 3 Encounters:  01/31/18 189 lb 12.8 oz (86.1 kg)  11/26/17 186 lb (84.4 kg)  11/12/17 187 lb 12.8 oz (85.2 kg)     GEN: Well nourished, well developed in no acute distress, he is tearful at times during my evaluation HEENT: Normal NECK: JVP is moderately elevated; No carotid bruits LYMPHATICS: No lymphadenopathy CARDIAC: RRR, no murmurs, rubs, gallops RESPIRATORY:  Clear to auscultation without rales, wheezing or rhonchi  ABDOMEN: Soft, non-tender, non-distended MUSCULOSKELETAL: Trace bilateral pretibial edema; No deformity  SKIN: Warm and dry NEUROLOGIC:  Alert and oriented x 3 PSYCHIATRIC:  Normal affect   ASSESSMENT:    1. Pericardial effusion   2. Acute on chronic diastolic heart failure ([place])   3. Coronary artery disease involving native coronary artery of native heart without angina pectoris    PLAN:    In order of problems listed above:  1. Will obtain a follow-up echocardiogram with his worsening symptoms 2. The patient has symptoms of acute on chronic diastolic heart failure with New York Heart Association functional class III limitation.  I am going to stop his hydrochlorothiazide and start a loop diuretic with furosemide 80 mg today followed by 40 mg daily beginning tomorrow.  We will check a metabolic  panel and BNP.  Will arrange close follow-up in about 2 weeks.  We discussed sodium restriction.  We discussed daily weights.  He  is treated with both carvedilol and valsartan.   3. The patient is having no symptoms of angina.  He continues on aspirin, clopidogrel, and a high intensity statin drug.  Medication Adjustments/Labs and Tests Ordered: Current medicines are reviewed at length with the patient today.  Concerns regarding medicines are outlined above.  Orders Placed This Encounter  Procedures  . Basic metabolic panel  . Pro b natriuretic peptide (BNP)  . ECHOCARDIOGRAM LIMITED   Meds ordered this encounter  Medications  . clopidogrel (PLAVIX) 75 MG tablet    Sig: Take 1 tablet (75 mg total) by mouth daily.    Dispense:  90 tablet    Refill:  3  . rosuvastatin (CRESTOR) 40 MG tablet    Sig: Take 1 tablet (40 mg total) by mouth daily. Dose change    Dispense:  90 tablet    Refill:  3  . furosemide (LASIX) 40 MG tablet    Sig: Take 1 tablet (40 mg total) by mouth daily.    Dispense:  90 tablet    Refill:  3  . potassium chloride SA (K-DUR,KLOR-CON) 20 MEQ tablet    Sig: Take 1 tablet (20 mEq total) by mouth daily.    Dispense:  90 tablet    Refill:  3    Patient Instructions  Medication Instructions:  1) STOP HCTZ (hydrochlorothiazide)  2) Take Lasix 80 mg TODAY then start Lasix 40 mg daily tomorrow 3) START KDUR 20 meq daily (starting TODAY)  Labwork: TODAY: BMET, BNP  Testing/Procedures: Dr. Burt Knack recommends you have a LIMITED ECHO. You are scheduled for your echo 02/04/18 at 2:00PM. Please arrive by 1:30PM for this appointment.   Follow-Up: You have an appointment with Dr. Antionette Char assistant, [place] Ades, on 02/14/2018 at 11:00AM.   Signed, Sherren Mocha, MD  02/02/2018 2:09 PM    [place]

## 2018-02-01 LAB — BASIC METABOLIC PANEL
BUN/Creatinine Ratio: 18 (ref 10–24)
BUN: 14 mg/dL (ref 8–27)
CALCIUM: 9.2 mg/dL (ref 8.6–10.2)
CO2: 23 mmol/L (ref 20–29)
Chloride: 100 mmol/L (ref 96–106)
Creatinine, Ser: 0.77 mg/dL (ref 0.76–1.27)
GFR, EST AFRICAN AMERICAN: 103 mL/min/{1.73_m2} (ref >59)
GFR, EST NON AFRICAN AMERICAN: 89 mL/min/{1.73_m2} (ref >59)
Glucose: 80 mg/dL (ref 65–99)
Potassium: 4.2 mmol/L (ref 3.5–5.2)
Sodium: 139 mmol/L (ref 134–144)

## 2018-02-01 LAB — PRO B NATRIURETIC PEPTIDE: NT-PRO BNP: 405 pg/mL (ref 0–486)

## 2018-02-04 ENCOUNTER — Ambulatory Visit (HOSPITAL_COMMUNITY): Payer: Medicare HMO | Attending: Cardiovascular Disease

## 2018-02-04 ENCOUNTER — Other Ambulatory Visit: Payer: Self-pay

## 2018-02-04 DIAGNOSIS — I313 Pericardial effusion (noninflammatory): Secondary | ICD-10-CM | POA: Insufficient documentation

## 2018-02-04 DIAGNOSIS — I3139 Other pericardial effusion (noninflammatory): Secondary | ICD-10-CM

## 2018-02-14 ENCOUNTER — Encounter: Payer: Self-pay | Admitting: Cardiology

## 2018-02-14 ENCOUNTER — Ambulatory Visit: Payer: Medicare HMO | Admitting: Cardiology

## 2018-02-14 VITALS — BP 128/66 | HR 76 | Ht 66.0 in | Wt 190.1 lb

## 2018-02-14 DIAGNOSIS — I2583 Coronary atherosclerosis due to lipid rich plaque: Secondary | ICD-10-CM

## 2018-02-14 DIAGNOSIS — I10 Essential (primary) hypertension: Secondary | ICD-10-CM | POA: Diagnosis not present

## 2018-02-14 DIAGNOSIS — I5032 Chronic diastolic (congestive) heart failure: Secondary | ICD-10-CM | POA: Insufficient documentation

## 2018-02-14 DIAGNOSIS — I5033 Acute on chronic diastolic (congestive) heart failure: Secondary | ICD-10-CM | POA: Diagnosis not present

## 2018-02-14 DIAGNOSIS — I2510 Atherosclerotic heart disease of native coronary artery without angina pectoris: Secondary | ICD-10-CM

## 2018-02-14 DIAGNOSIS — I313 Pericardial effusion (noninflammatory): Secondary | ICD-10-CM | POA: Diagnosis not present

## 2018-02-14 DIAGNOSIS — I3139 Other pericardial effusion (noninflammatory): Secondary | ICD-10-CM

## 2018-02-14 DIAGNOSIS — I110 Hypertensive heart disease with heart failure: Secondary | ICD-10-CM | POA: Diagnosis not present

## 2018-02-14 LAB — BASIC METABOLIC PANEL
BUN / CREAT RATIO: 16 (ref 10–24)
BUN: 14 mg/dL (ref 8–27)
CO2: 25 mmol/L (ref 20–29)
Calcium: 10 mg/dL (ref 8.6–10.2)
Chloride: 100 mmol/L (ref 96–106)
Creatinine, Ser: 0.86 mg/dL (ref 0.76–1.27)
GFR calc Af Amer: 98 mL/min/{1.73_m2} (ref >59)
GFR calc non Af Amer: 85 mL/min/{1.73_m2} (ref >59)
Glucose: 152 mg/dL — ABNORMAL HIGH (ref 65–99)
Potassium: 4.6 mmol/L (ref 3.5–5.2)
SODIUM: 140 mmol/L (ref 134–144)

## 2018-02-14 NOTE — Progress Notes (Signed)
Cardiology Office Note:    Date:  02/14/2018   ID:  [name], DOB 11-Oct-1940, MRN [id number]  PCP:  Horald Pollen, MD  Cardiologist:  Sherren Mocha, MD  Referring MD: Horald Pollen, *   Chief Complaint  Patient presents with  . Follow-up    CHF    History of Present Illness:    [name] is a 77 y.o. male with a past medical history significant for CAD with remote stenting of the circumflex and OM branches in 2008, non-STEMI and CABG 03/2017.  He has been followed for a significant pericardial effusion after his bypass surgery with serial imaging and continued clinical surveillance.  The effusion has primarily been posterior and moderate in size.  He was recently seen by Dr. Burt Knack on 01/31/2018 with complaints of shortness of breath, nonproductive cough, orthopnea and PND.  He had no leg swelling.  He was feeling poorly.  A limited echo was done on 02/04/2018 that showed normal EF 60-65%, mild LVH, no regional wall motion abnormalities, grade 1 diastolic dysfunction, mildly-moderately reduced RV function.  There was no pericardial effusion.  BNP was within normal range.  Renal function and potassium were normal.  Mr [name] is here today for close follow-up of diastolic heart failure after increasing diuretic, with his wife of 56 years. He walks at a park several times per week about 30-45 minutes. He feels like his breathing is better but not quite back to normal, prior to CABG. He gets short of breath when bending over to tie his shoes but has no orthpnea, PND, edema or DOE. His wife thinks he is much better. He has had no chest discomfort or pressure.   He is eating more fruits and vegetables and limiting salt intake.   Past Medical History:  Diagnosis Date  . Blurred vision 05/17/2016  . CAD (coronary artery disease), native coronary artery 06/14/2008   a. BMS to LCx & OM 2008 b. 03/2017 CABG x 4 (LIMA to LAD, SVG to diag 1, SVG to distal Circ, SVG  to PDA)  . Chest pain 04/16/2012  . Diplopia   . Echocardiogram    Echo 10/19: mod LVH, EF 55-60, no RWMA, Gr 1 DD, trivial AI, MAC, mod LAE, prob small post effusion  . Essential hypertension 05/17/2016  . Hyperlipidemia with target low density lipoprotein (LDL) cholesterol less than 70 mg/dL 06/14/2008   Qualifier: Diagnosis of  By: Mare Ferrari, RMA, [name]    . Hypertension   . Hypertensive heart disease 06/14/2008   Qualifier: Diagnosis of  By: Mare Ferrari, [place], [name]    . Hypertensive urgency, malignant 04/16/2012  . Hypokalemia 04/14/2017  . Internal hemorrhoids 04/19/2012  . Ischemic stroke ([place]) 05/02/2016  . Non-ST elevation (NSTEMI) myocardial infarction ([place])   . pandiverticulosis 04/22/2012   12/17/2011. [place]. Juanita Craver MD. Colonoscopy. Moderate sized internal hemorrhoids and extensive pandiverticulosis. Repeat 5 years.    . Pericardial effusion    Echo 11/19: mild focal basal septal hypertrophy, EF 55-60, Gr 2 DD, trivial AI, trivial TR, trivial PI, small to mod eff post to heart - no evidence of RV collapse.>> repeat limited echo in 03/2018  . Polycythemia vera(238.4) 04/17/2012  . S/P CABG x 4 04/17/2017  . Syncope and collapse 04/17/2012   Pt syncopized while sitting in bed giving history @ time of admission to hospital Tachycardic (appeared sinus) to 120s-130s and hypotensive to 50s/30s.  Unresponsive initially >> Spontaneously resolved after 2-3 minutes >> return to baseline ~30  minutes   . Vertigo     Past Surgical History:  Procedure Laterality Date  . CORONARY ARTERY BYPASS GRAFT N/A 04/17/2017   Procedure: CORONARY ARTERY BYPASS GRAFTING (CABG) x4 , using left internal mammary artery  to LAD and right leg greater saphenous vein harvested endoscopically  to PDA, Diagonal I and Circumflex;  Surgeon: [name], MD;  Location: [facility];  Service: Open Heart Surgery;  Laterality: N/A;  . CORONARY STENT PLACEMENT    . LEFT HEART CATH AND CORONARY ANGIOGRAPHY N/A  04/16/2017   Procedure: LEFT HEART CATH AND CORONARY ANGIOGRAPHY;  Surgeon: Sherren Mocha, MD;  Location: [facility];  Service: Cardiovascular;  Laterality: N/A;  . TEE WITHOUT CARDIOVERSION N/A 04/17/2017   Procedure: TRANSESOPHAGEAL ECHOCARDIOGRAM (TEE);  Surgeon: [name], MD;  Location: [facility];  Service: Open Heart Surgery;  Laterality: N/A;    Current Medications: Current Meds  Medication Sig  . acetaminophen (TYLENOL) 325 MG tablet Take 2 tablets (650 mg total) by mouth every 6 (six) hours as needed for mild pain.  Marland Kitchen aspirin EC 81 MG tablet Take 81 mg by mouth daily.  . carvedilol (COREG) 12.5 MG tablet Take 1 tablet (12.5 mg total) by mouth 2 (two) times daily.  . clopidogrel (PLAVIX) 75 MG tablet Take 1 tablet (75 mg total) by mouth daily.  . potassium chloride SA (K-DUR,KLOR-CON) 20 MEQ tablet Take 1 tablet (20 mEq total) by mouth daily.  . rosuvastatin (CRESTOR) 40 MG tablet Take 1 tablet (40 mg total) by mouth daily. Dose change  . valsartan (DIOVAN) 160 MG tablet Take 1 tablet (160 mg total) by mouth daily. Dose change     Allergies:   Lipitor [atorvastatin]; Morphine and related; and Pork-derived products   Social History   Socioeconomic History  . Marital status: Married    Spouse name: Not on file  . Number of children: Not on file  . Years of education: Not on file  . Highest education level: Not on file  Occupational History  . Not on file  Social Needs  . Financial resource strain: Not on file  . Food insecurity:    Worry: Not on file    Inability: Not on file  . Transportation needs:    Medical: Not on file    Non-medical: Not on file  Tobacco Use  . Smoking status: Never Smoker  . Smokeless tobacco: Never Used  Substance and Sexual Activity  . Alcohol use: No    Alcohol/week: 0.0 standard drinks  . Drug use: No  . Sexual activity: Not on file  Lifestyle  . Physical activity:    Days per week: Not on file    Minutes per session: Not  on file  . Stress: Not on file  Relationships  . Social connections:    Talks on phone: Not on file    Gets together: Not on file    Attends religious service: Not on file    Active member of club or organization: Not on file    Attends meetings of clubs or organizations: Not on file    Relationship status: Not on file  Other Topics Concern  . Not on file  Social History Narrative   Lives in [place] with Wife and 2 sons.  From [place].  To [place] ~2000.     Currently retired but worked in Veterinary surgeon     Family History: The patient's family history includes Cancer in his sister; Heart disease in  his mother. ROS:   Please see the history of present illness.     All other systems reviewed and are negative.  EKGs/Labs/Other Studies Reviewed:    The following studies were reviewed today:  Limited echo 02/04/2018 Study Conclusions  - Left ventricle: The cavity size was normal. Wall thickness was   increased in a pattern of mild LVH. Systolic function was normal.   The estimated ejection fraction was in the range of 60% to 65%.   Wall motion was normal; there were no regional wall motion   abnormalities. Doppler parameters are consistent with abnormal   left ventricular relaxation (grade 1 diastolic dysfunction). - Right ventricle: Systolic function was mildly to moderately   reduced. Pericardium:  There was no pericardial effusion.   Echocardiogram 12/31/2017 Study Conclusions - Left ventricle: The cavity size was normal. There was mild focal   basal hypertrophy of the septum. Systolic function was normal.   The estimated ejection fraction was in the range of 55% to 60%.   Wall motion was normal; there were no regional wall motion   abnormalities. Features are consistent with a pseudonormal left   ventricular filling pattern, with concomitant abnormal relaxation   and increased filling pressure (grade 2 diastolic dysfunction). - Aortic valve: There was trivial  regurgitation. - Mitral valve: There was no significant regurgitation. - Tricuspid valve: There was trivial regurgitation. - Pulmonic valve: There was trivial regurgitation. - Pericardium, extracardiac: A small to moderate pericardial   effusion was identified posterior to the heart. The fluid had no   internal echoes.  Impressions: - Compared to recent study, there is a small-moderate pericardial   effusion, measuring approximately 2 cm in posterior views. No   evidence of RV diastolic collapse, but IVC mildly dilated and   does not fully collapse. There is mild respiratory variation in   mitral and tricuspid inflow velocities, but it does not suggest   hemodynamic compromise. It appears similar to prior study, though   it is more clearly imaged on today&'s study.   EKG:  EKG is not ordered today.    Recent Labs: 11/04/2017: ALT 13 11/05/2017: Hemoglobin 13.6; Magnesium 2.1; Platelets 202 11/12/2017: BNP 181.7 01/31/2018: BUN 14; Creatinine, Ser 0.77; NT-Pro BNP 405; Potassium 4.2; Sodium 139   Recent Lipid Panel    Component Value Date/Time   CHOL 101 05/29/2017 1013   TRIG 84 05/29/2017 1013   HDL 35 (L) 05/29/2017 1013   CHOLHDL 2.9 05/29/2017 1013   CHOLHDL 4.5 04/15/2017 0241   VLDL 9 04/15/2017 0241   LDLCALC 49 05/29/2017 1013   LDLDIRECT 150.8 06/30/2008 1007    Physical Exam:    VS:  BP 128/66   Pulse 76   Ht _0  (1.676 m)   Wt 190 lb 1.9 oz (86.2 kg)   SpO2 95%   BMI 30.69 kg/m     Wt Readings from Last 3 Encounters:  02/14/18 190 lb 1.9 oz (86.2 kg)  01/31/18 189 lb 12.8 oz (86.1 kg)  11/26/17 186 lb (84.4 kg)     Physical Exam  Constitutional: He is oriented to person, place, and time. He appears well-developed and well-nourished. No distress.  HENT:  Head: Normocephalic and atraumatic.  Neck: Normal range of motion. Neck supple. No JVD present.  Cardiovascular: Normal rate, regular rhythm, normal heart sounds and intact distal pulses. Exam  reveals no gallop and no friction rub.  No murmur heard. Pulmonary/Chest: Effort normal and breath sounds normal. No respiratory distress. He  has no wheezes. He has no rales.  Abdominal: Soft. Bowel sounds are normal.  Musculoskeletal: Normal range of motion.        General: No deformity or edema.  Neurological: He is alert and oriented to person, place, and time.  Skin: Skin is warm and dry.  Psychiatric: He has a normal mood and affect. His behavior is normal. Judgment and thought content normal.  Vitals reviewed.   ASSESSMENT:    1. CAD s/p CABG x4   2. Acute on chronic diastolic heart failure ([place])   3. Essential hypertension   4. Pericardial effusion    PLAN:    In order of problems listed above:  1.  Acute on chronic diastolic CHF, NYHA functional class III -Patient was seen on 01/31/2018 with increased shortness of breath and orthopnea.  He was started on furosemide 80 mg x 1 followed by 40 mg daily. -Breathing is improved, still has some bendopnea but no orthopnea, PND, edema or DOE.  -Continue current meds and diuretic. Will check renal function and potassium.  -advised to continue walking, increasing to 30 minutes on most days.  -Advised on daily wts and what to report.  -DASH diet, low sodium  2.  CAD -Status post remote stent and recent CABG in 03/2017 -He continues on aspirin, clopidogrel and high intensity statin -No angina  3.  Hypertension -BP well controlled. Continue current therapy.  4.  Pericardial effusion -No longer present on recent limited echo   Medication Adjustments/Labs and Tests Ordered: Current medicines are reviewed at length with the patient today.  Concerns regarding medicines are outlined above. Labs and tests ordered and medication changes are outlined in the patient instructions below:  Patient Instructions  Medication Instructions:   Your physician recommends that you continue on your current medications as directed. Please refer to  the Current Medication list given to you today.    If you need a lab : BMET TODAY     If you have labs (blood work) drawn today and your tests are completely normal, you will receive your results only by: Marland Kitchen MyChart Message (if you have MyChart) OR . A paper copy in the mail If you have any lab test that is abnormal or we need to change your treatment, we will call you to review the results.   Testing/Procedures: NONE ORDERED  TODAY   Follow-Up: At [facility], you and your health needs are our priority.  As part of our continuing mission to provide you with exceptional heart care, we have created designated Provider Care Teams.  These Care Teams include your primary Cardiologist (physician) and Advanced Practice Providers (APPs -  Physician Assistants and Nurse Practitioners) who all work together to provide you with the care you need, when you need it. You will need a follow up appointment in:  6 MONTHS .  Please call our office 2 months in advance to schedule this appointment.  You may see Sherren Mocha, MD  or one of the following Advanced Practice Providers on your designated Care Team: Richardson Dopp, PA-C [place], [place] . Daune Perch, NP  Any Other Special Instructions Will Be Listed Below (If Applicable).      Signed, Daune Perch, NP  02/14/2018 11:52 AM    [place]

## 2018-02-14 NOTE — Patient Instructions (Addendum)
Medication Instructions:   Your physician recommends that you continue on your current medications as directed. Please refer to the Current Medication list given to you today.    If you need a lab : BMET TODAY     If you have labs (blood work) drawn today and your tests are completely normal, you will receive your results only by: Marland Kitchen MyChart Message (if you have MyChart) OR . A paper copy in the mail If you have any lab test that is abnormal or we need to change your treatment, we will call you to review the results.   Testing/Procedures: NONE ORDERED  TODAY   Follow-Up: At [facility], you and your health needs are our priority.  As part of our continuing mission to provide you with exceptional heart care, we have created designated Provider Care Teams.  These Care Teams include your primary Cardiologist (physician) and Advanced Practice Providers (APPs -  Physician Assistants and Nurse Practitioners) who all work together to provide you with the care you need, when you need it. You will need a follow up appointment in:  6 MONTHS .  Please call our office 2 months in advance to schedule this appointment.  You may see Sherren Mocha, MD  or one of the following Advanced Practice Providers on your designated Care Team: Richardson Dopp, PA-C [place], [place] . Daune Perch, NP  Any Other Special Instructions Will Be Listed Below (If Applicable).

## 2018-02-19 HISTORY — PX: CORONARY ARTERY BYPASS GRAFT: SHX141

## 2018-03-10 ENCOUNTER — Encounter: Payer: Self-pay | Admitting: Physician Assistant

## 2018-03-10 NOTE — Telephone Encounter (Signed)
This encounter was created in error - please disregard.

## 2018-03-24 ENCOUNTER — Ambulatory Visit: Payer: Medicare HMO | Admitting: Cardiovascular Disease

## 2018-04-09 ENCOUNTER — Ambulatory Visit (HOSPITAL_COMMUNITY): Payer: Medicare HMO | Attending: Cardiology

## 2018-04-09 ENCOUNTER — Encounter: Payer: Self-pay | Admitting: Physician Assistant

## 2018-04-09 ENCOUNTER — Encounter (INDEPENDENT_AMBULATORY_CARE_PROVIDER_SITE_OTHER): Payer: Self-pay

## 2018-04-09 DIAGNOSIS — I3139 Other pericardial effusion (noninflammatory): Secondary | ICD-10-CM

## 2018-04-09 DIAGNOSIS — I313 Pericardial effusion (noninflammatory): Secondary | ICD-10-CM | POA: Insufficient documentation

## 2018-05-09 ENCOUNTER — Telehealth: Payer: Self-pay | Admitting: *Deleted

## 2018-05-09 ENCOUNTER — Encounter: Payer: Self-pay | Admitting: Emergency Medicine

## 2018-05-09 ENCOUNTER — Ambulatory Visit (INDEPENDENT_AMBULATORY_CARE_PROVIDER_SITE_OTHER): Payer: Medicare HMO

## 2018-05-09 ENCOUNTER — Other Ambulatory Visit: Payer: Self-pay

## 2018-05-09 ENCOUNTER — Ambulatory Visit (INDEPENDENT_AMBULATORY_CARE_PROVIDER_SITE_OTHER): Payer: Medicare HMO | Admitting: Emergency Medicine

## 2018-05-09 VITALS — BP 118/72 | HR 75 | Temp 97.7°F | Resp 17 | Ht 66.0 in | Wt 191.0 lb

## 2018-05-09 DIAGNOSIS — I110 Hypertensive heart disease with heart failure: Secondary | ICD-10-CM | POA: Diagnosis not present

## 2018-05-09 DIAGNOSIS — Z951 Presence of aortocoronary bypass graft: Secondary | ICD-10-CM

## 2018-05-09 DIAGNOSIS — I700 Atherosclerosis of aorta: Secondary | ICD-10-CM | POA: Diagnosis not present

## 2018-05-09 DIAGNOSIS — I119 Hypertensive heart disease without heart failure: Secondary | ICD-10-CM

## 2018-05-09 DIAGNOSIS — R0600 Dyspnea, unspecified: Secondary | ICD-10-CM

## 2018-05-09 DIAGNOSIS — Z23 Encounter for immunization: Secondary | ICD-10-CM | POA: Diagnosis not present

## 2018-05-09 DIAGNOSIS — F418 Other specified anxiety disorders: Secondary | ICD-10-CM | POA: Diagnosis not present

## 2018-05-09 DIAGNOSIS — I5032 Chronic diastolic (congestive) heart failure: Secondary | ICD-10-CM

## 2018-05-09 DIAGNOSIS — I10 Essential (primary) hypertension: Secondary | ICD-10-CM

## 2018-05-09 DIAGNOSIS — R0609 Other forms of dyspnea: Secondary | ICD-10-CM

## 2018-05-09 DIAGNOSIS — R0602 Shortness of breath: Secondary | ICD-10-CM | POA: Diagnosis not present

## 2018-05-09 MED ORDER — ALPRAZOLAM 0.25 MG PO TABS: 0.2500 mg | ORAL_TABLET | Freq: Two times a day (BID) | ORAL | 1 refills | Status: DC | PRN

## 2018-05-09 MED ORDER — CLOPIDOGREL BISULFATE 75 MG PO TABS: 75.0000 mg | ORAL_TABLET | Freq: Every day | ORAL | 3 refills | Status: DC

## 2018-05-09 NOTE — Patient Instructions (Addendum)
If you have lab work done today you will be contacted with your lab results within the next 2 weeks.  If you have not heard from [place] then please contact us. The fastest way to get your results is to register for My Chart.   IF you received an x-ray today, you will receive an invoice from [facility]. Please contact [facility] at [phone] with questions or concerns regarding your invoice.   IF you received labwork today, you will receive an invoice from [facility]. Please contact [facility] at [phone] with questions or concerns regarding your invoice.   Our billing staff will not be able to assist you with questions regarding bills from these companies.  You will be contacted with the lab results as soon as they are available. The fastest way to get your results is to activate your My Chart account. Instructions are located on the last page of this paperwork. If you have not heard from [place] regarding the results in 2 weeks, please contact this office.      Health Maintenance After Age 36 After age 63, you are at a higher risk for certain long-term diseases and infections as well as injuries from falls. Falls are a major cause of broken bones and head injuries in people who are older than age 25. Getting regular preventive care can help to keep you healthy and well. Preventive care includes getting regular testing and making lifestyle changes as recommended by your health care provider. Talk with your health care provider about:  Which screenings and tests you should have. A screening is a test that checks for a disease when you have no symptoms.  A diet and exercise plan that is right for you. What should I know about screenings and tests to prevent falls? Screening and testing are the best ways to find a health problem early. Early diagnosis and treatment give you the best chance of managing medical conditions that are common after age 57. Certain conditions and  lifestyle choices may make you more likely to have a fall. Your health care provider may recommend:  Regular vision checks. Poor vision and conditions such as cataracts can make you more likely to have a fall. If you wear glasses, make sure to get your prescription updated if your vision changes.  Medicine review. Work with your health care provider to regularly review all of the medicines you are taking, including over-the-counter medicines. Ask your health care provider about any side effects that may make you more likely to have a fall. Tell your health care provider if any medicines that you take make you feel dizzy or sleepy.  Osteoporosis screening. Osteoporosis is a condition that causes the bones to get weaker. This can make the bones weak and cause them to break more easily.  Blood pressure screening. Blood pressure changes and medicines to control blood pressure can make you feel dizzy.  Strength and balance checks. Your health care provider may recommend certain tests to check your strength and balance while standing, walking, or changing positions.  Foot health exam. Foot pain and numbness, as well as not wearing proper footwear, can make you more likely to have a fall.  Depression screening. You may be more likely to have a fall if you have a fear of falling, feel emotionally low, or feel unable to do activities that you used to do.  Alcohol use screening. Using too much alcohol can affect your balance and may make you more likely  have a fall. What actions can I take to lower my risk of falls? General instructions  Talk with your health care provider about your risks for falling. Tell your health care provider if: ? You fall. Be sure to tell your health care provider about all falls, even ones that seem minor. ? You feel dizzy, sleepy, or off-balance.  Take over-the-counter and prescription medicines only as told by your health care provider. These include any  supplements.  Eat a healthy diet and maintain a healthy weight. A healthy diet includes low-fat dairy products, low-fat (lean) meats, and fiber from whole grains, beans, and lots of fruits and vegetables. Home safety  Remove any tripping hazards, such as rugs, cords, and clutter.  Install safety equipment such as grab bars in bathrooms and safety rails on stairs.  Keep rooms and walkways well-lit. Activity   Follow a regular exercise program to stay fit. This will help you maintain your balance. Ask your health care provider what types of exercise are appropriate for you.  If you need a cane or walker, use it as recommended by your health care provider.  Wear supportive shoes that have nonskid soles. Lifestyle  Do not drink alcohol if your health care provider tells you not to drink.  If you drink alcohol, limit how much you have: ? 0-1 drink a day for women. ? 0-2 drinks a day for men.  Be aware of how much alcohol is in your drink. In the [place] one drink equals one typical bottle of beer (12 oz), one-half glass of wine (5 oz), or one shot of hard liquor (1 oz).  Do not use any products that contain nicotine or tobacco, such as cigarettes and e-cigarettes. If you need help quitting, ask your health care provider. Summary  Having a healthy lifestyle and getting preventive care can help to protect your health and wellness after age 65.  Screening and testing are the best way to find a health problem early and help you avoid having a fall. Early diagnosis and treatment give you the best chance for managing medical conditions that are more common for people who are older than age 65.  Falls are a major cause of broken bones and head injuries in people who are older than age 65. Take precautions to prevent a fall at home.  Work with your health care provider to learn what changes you can make to improve your health and wellness and to prevent falls. This information is not intended  to replace advice given to you by your health care provider. Make sure you discuss any questions you have with your health care provider. Document Released: 12/19/2016 Document Revised: 12/19/2016 Document Reviewed: 12/19/2016 Elsevier Interactive Patient Education  2019 [employer]  

## 2018-05-09 NOTE — Telephone Encounter (Signed)
S[place] spoke Kevon (pharmacist) to cancel Xanax, because the patient change to a local pharmacy. Dr Mitchel Honour advised.

## 2018-05-09 NOTE — Progress Notes (Signed)
[name] 78 y.o.   Chief Complaint  Patient presents with   Palpitations    HISTORY OF PRESENT ILLNESS: This is a 78 y.o. male complaining of occasional palpitations with dyspnea on exertion for the past 2 months. Feels stressed out with periodic crying.  Denies chest pain or any other significant symptoms.  Denies flulike symptoms. Recent echocardiogram showed the following:  IMPRESSIONS    1. The left ventricle has normal systolic function, with an ejection fraction of 55-60%. The cavity size was normal. There is mildly increased left ventricular wall thickness. Left ventricular diastolic Doppler parameters are consistent with impaired  relaxation No evidence of left ventricular regional wall motion abnormalities.  2. The right ventricle has normal systolc function. The cavity was normal. There is no increase in right ventricular wall thickness.  3. Trivial pericardial effusion is present.  4. The mitral valve is normal in structure. No evidence of mitral valve stenosis. No significant mitral regurgitation.  5. The tricuspid valve was normal in structure.  6. The pulmonic valve was normal in structure.  7. The ascending aorta is normal in size and structure.  8. There is mild dilatation of the aortic root measuring 40 mm.  9. IVC normal. PA systolic pressure 23 mmHg.  No other complaints or medical concerns at this point.  Patient Active Problem List   Diagnosis Date Noted   Acute on chronic diastolic heart failure ([place]) 02/14/2018   Pericardial effusion    Abnormal CT of the chest 11/05/2017   Non-ST elevation (NSTEMI) myocardial infarction [facility])    Essential hypertension 05/17/2016   Ischemic stroke ([place]) 05/02/2016   pandiverticulosis 04/22/2012   Internal hemorrhoids 04/19/2012   Hyperlipidemia with target low density lipoprotein (LDL) cholesterol less than 70 mg/dL 06/14/2008   Hypertensive heart disease 06/14/2008   CAD s/p CABG x4 06/14/2008    02/14/18 Cardiology visit:  In order of problems listed above:  1.  Acute on chronic diastolic CHF, NYHA functional class III -Patient was seen on 01/31/2018 with increased shortness of breath and orthopnea.  He was started on furosemide 80 mg x 1 followed by 40 mg daily. -Breathing is improved, still has some bendopnea but no orthopnea, PND, edema or DOE.  -Continue current meds and diuretic. Will check renal function and potassium.  -advised to continue walking, increasing to 30 minutes on most days.  -Advised on daily wts and what to report.  -DASH diet, low sodium  2.  CAD -Status post remote stent and recent CABG in 03/2017 -He continues on aspirin, clopidogrel and high intensity statin -No angina  3.  Hypertension -BP well controlled. Continue current therapy.  4.  Pericardial effusion -No longer present on recent limited echo HPI   Prior to Admission medications   Medication Sig Start Date End Date Taking? Authorizing Provider  acetaminophen (TYLENOL) 325 MG tablet Take 2 tablets (650 mg total) by mouth every 6 (six) hours as needed for mild pain. 04/24/17  Yes Barrett, Lodema Hong, PA-C  aspirin EC 81 MG tablet Take 81 mg by mouth daily.   Yes [provider]  carvedilol (COREG) 12.5 MG tablet Take 1 tablet (12.5 mg total) by mouth 2 (two) times daily. 11/12/17  Yes Jaynee Eagles, PA-C  clopidogrel (PLAVIX) 75 MG tablet Take 1 tablet (75 mg total) by mouth daily. 01/31/18  Yes Sherren Mocha, MD  potassium chloride SA (K-DUR,KLOR-CON) 20 MEQ tablet Take 1 tablet (20 mEq total) by mouth daily. 01/31/18  Yes Sherren Mocha, MD  rosuvastatin (CRESTOR) 40 MG tablet Take 1 tablet (40 mg total) by mouth daily. Dose change 01/31/18  Yes Burt Knack, Legrand Como, MD  valsartan (DIOVAN) 160 MG tablet Take 1 tablet (160 mg total) by mouth daily. Dose change 11/12/17  Yes Jaynee Eagles, PA-C    Allergies  Allergen Reactions   Lipitor [Atorvastatin] Shortness Of Breath and Other (See  Comments)    Sores non head   Morphine And Related Shortness Of Breath, Swelling and Rash    Throat swelling   Pork-Derived Products Swelling and Rash    Tongue swelling No pork products -     Patient Active Problem List   Diagnosis Date Noted   Acute on chronic diastolic heart failure (HCC) 02/14/2018   Pericardial effusion    Abnormal CT of the chest 11/05/2017   Syncope 11/04/2017   Acute bilateral low back pain without sciatica 10/28/2017   Pain in joint of left shoulder 10/28/2017   Non-ST elevation (NSTEMI) myocardial infarction ([place])    Hypokalemia 04/14/2017   Essential hypertension 05/17/2016   Blurred vision 05/17/2016   Vertigo    Diplopia    Ischemic stroke ([place]) 05/02/2016   pandiverticulosis 04/22/2012   Internal hemorrhoids 04/19/2012   Hyperlipidemia with target low density lipoprotein (LDL) cholesterol less than 70 mg/dL 06/14/2008   Hypertensive heart disease 06/14/2008   CAD s/p CABG x4 06/14/2008    Past Medical History:  Diagnosis Date   Blurred vision 05/17/2016   CAD (coronary artery disease), native coronary artery 06/14/2008   a. BMS to LCx & OM 2008 b. 03/2017 CABG x 4 (LIMA to LAD, SVG to diag 1, SVG to distal Circ, SVG to PDA)   Chest pain 04/16/2012   Diplopia    Echocardiogram    Echo 10/19: mod LVH, EF 55-60, no RWMA, Gr 1 DD, trivial AI, MAC, mod LAE, prob small post effusion   Essential hypertension 05/17/2016   Hyperlipidemia with target low density lipoprotein (LDL) cholesterol less than 70 mg/dL 06/14/2008   Qualifier: Diagnosis of  By: Mare Ferrari, RMA, [name]     Hypertension    Hypertensive heart disease 06/14/2008   Qualifier: Diagnosis of  By: Mare Ferrari, RMA, [name]     Hypertensive urgency, malignant 04/16/2012   Hypokalemia 04/14/2017   Internal hemorrhoids 04/19/2012   Ischemic stroke ([place]) 05/02/2016   Non-ST elevation (NSTEMI) myocardial infarction [facility])    pandiverticulosis 04/22/2012   12/17/2011.  [place]. Juanita Craver MD. Colonoscopy. Moderate sized internal hemorrhoids and extensive pandiverticulosis. Repeat 5 years.     Pericardial effusion    Echo 11/19: mild focal basal septal hypertrophy, EF 55-60, Gr 2 DD, trivial AI, trivial TR, trivial PI, small to mod eff post to heart - no evidence of RV collapse.>> repeat limited echo in 03/2018 // Echo 03/2018: EF 55-60, mild LVH, +diastolic dysfunction, no RWMA, PASP 23, trivial pericardial effusion    Polycythemia vera(238.4) 04/17/2012   S/P CABG x 4 04/17/2017   Syncope and collapse 04/17/2012   Pt syncopized while sitting in bed giving history @ time of admission to hospital Tachycardic (appeared sinus) to 120s-130s and hypotensive to 50s/30s.  Unresponsive initially >> Spontaneously resolved after 2-3 minutes >> return to baseline ~30 minutes    Vertigo     Past Surgical History:  Procedure Laterality Date   CORONARY ARTERY BYPASS GRAFT N/A 04/17/2017   Procedure: CORONARY ARTERY BYPASS GRAFTING (CABG) x4 , using left internal mammary artery  to LAD and right leg greater saphenous vein harvested endoscopically  to PDA, Diagonal I and Circumflex;  Surgeon: [name], MD;  Location: [facility];  Service: Open Heart Surgery;  Laterality: N/A;   CORONARY STENT PLACEMENT     LEFT HEART CATH AND CORONARY ANGIOGRAPHY N/A 04/16/2017   Procedure: LEFT HEART CATH AND CORONARY ANGIOGRAPHY;  Surgeon: Sherren Mocha, MD;  Location: [facility];  Service: Cardiovascular;  Laterality: N/A;   TEE WITHOUT CARDIOVERSION N/A 04/17/2017   Procedure: TRANSESOPHAGEAL ECHOCARDIOGRAM (TEE);  Surgeon: [name], MD;  Location: [facility];  Service: Open Heart Surgery;  Laterality: N/A;    Social History   Socioeconomic History   Marital status: Married    Spouse name: Not on file   Number of children: Not on file   Years of education: Not on file   Highest education level: Not on file  Occupational History   Not  on file  Social Needs   Financial resource strain: Not on file   Food insecurity:    Worry: Not on file    Inability: Not on file   Transportation needs:    Medical: Not on file    Non-medical: Not on file  Tobacco Use   Smoking status: Never Smoker   Smokeless tobacco: Never Used  Substance and Sexual Activity   Alcohol use: No    Alcohol/week: 0.0 standard drinks   Drug use: No   Sexual activity: Not on file  Lifestyle   Physical activity:    Days per week: Not on file    Minutes per session: Not on file   Stress: Not on file  Relationships   Social connections:    Talks on phone: Not on file    Gets together: Not on file    Attends religious service: Not on file    Active member of club or organization: Not on file    Attends meetings of clubs or organizations: Not on file    Relationship status: Not on file   Intimate partner violence:    Fear of current or ex partner: Not on file    Emotionally abused: Not on file    Physically abused: Not on file    Forced sexual activity: Not on file  Other Topics Concern   Not on file  Social History Narrative   Lives in [place] with Wife and 2 sons.  From [place].  To [place] ~2000.     Currently retired but worked in Veterinary surgeon    Family History  Problem Relation Age of Onset   Heart disease Mother    Cancer Sister      Review of Systems  Constitutional: Negative.  Negative for chills and fever.  HENT: Negative.   Eyes: Negative.   Respiratory: Positive for shortness of breath (Dyspnea on exertion). Negative for cough, sputum production and wheezing.   Cardiovascular: Positive for palpitations. Negative for chest pain, orthopnea, leg swelling and PND.  Gastrointestinal: Negative.  Negative for abdominal pain, diarrhea, nausea and vomiting.  Genitourinary: Negative.   Skin: Negative.   Neurological: Negative.  Negative for dizziness and headaches.  Endo/Heme/Allergies: Negative.     Psychiatric/Behavioral: The patient is nervous/anxious.   All other systems reviewed and are negative.  Vitals:   05/09/18 1022  BP: 118/72  Pulse: 75  Resp: 17  Temp: 97.7 F (36.5 C)  SpO2: 98%     Physical Exam Vitals signs reviewed.  Constitutional:      Appearance: Normal appearance.  HENT:     Head:  Normocephalic and atraumatic.     Nose: Nose normal.     Mouth/Throat:     Mouth: Mucous membranes are moist.     Pharynx: Oropharynx is clear.  Eyes:     Extraocular Movements: Extraocular movements intact.     Conjunctiva/sclera: Conjunctivae normal.     Pupils: Pupils are equal, round, and reactive to light.  Neck:     Musculoskeletal: Normal range of motion and neck supple.     Vascular: No JVD.  Cardiovascular:     Rate and Rhythm: Normal rate and regular rhythm.     Heart sounds: Normal heart sounds.  Pulmonary:     Effort: Pulmonary effort is normal.     Breath sounds: Normal breath sounds. No rales.  Abdominal:     Palpations: Abdomen is soft.     Tenderness: There is no abdominal tenderness.  Musculoskeletal: Normal range of motion.     Right lower leg: No edema.     Left lower leg: No edema.  Skin:    General: Skin is warm and dry.     Capillary Refill: Capillary refill takes less than 2 seconds.  Neurological:     General: No focal deficit present.     Mental Status: He is alert and oriented to person, place, and time.  Psychiatric:        Mood and Affect: Mood normal.        Behavior: Behavior normal.    Dg Chest 2 View  Result Date: 05/09/2018 CLINICAL DATA:  Shortness of breath with exertion EXAM: CHEST - 2 VIEW COMPARISON:  Chest radiograph November 04, 2017 and chest CT November 04, 2017 FINDINGS: There is slight atelectatic change in the left base. The lungs elsewhere are clear. Heart size and pulmonary vascularity are normal. No adenopathy. Patient is status post coronary artery bypass grafting. There is aortic atherosclerosis. There is  degenerative change in the midthoracic spine. IMPRESSION: Mild atelectasis in the left base. Lungs elsewhere clear. Stable cardiac silhouette. Postoperative changes noted. Aortic Atherosclerosis (ICD10-I70.0). Electronically Signed   By: Lowella Grip III M.D.   On: 05/09/2018 10:56   A total of 40 minutes was spent in the room with the patient, greater than 50% of which was in counseling/coordination of care regarding chronic medical problems, treatment, diet and nutrition, medications, chest x-ray review, review of echocardiogram and recent labs, prognosis, and need for follow-up.   ASSESSMENT & PLAN:  Clinically stable and no concerns identified today except a state of general anxiety.  Tomer was seen today for palpitations.  Diagnoses and all orders for this visit:  Dyspnea on exertion -     DG Chest 2 View; Future  Need for prophylactic vaccination against Streptococcus pneumoniae (pneumococcus) -     Pneumococcal conjugate vaccine 13-valent IM  Situational anxiety -     Discontinue: ALPRAZolam (XANAX) 0.25 MG tablet; Take 1 tablet (0.25 mg total) by mouth 2 (two) times daily as needed for anxiety. -     ALPRAZolam (XANAX) 0.25 MG tablet; Take 1 tablet (0.25 mg total) by mouth 2 (two) times daily as needed for anxiety.  Essential hypertension  S/P CABG x 4  Hypertensive heart disease without heart failure  Aortic atherosclerosis (HCC)  Chronic diastolic heart failure ([place])  Other orders -     clopidogrel (PLAVIX) 75 MG tablet; Take 1 tablet (75 mg total) by mouth daily.      Patient Instructions       If you have lab work  done today you will be contacted with your lab results within the next 2 weeks.  If you have not heard from [place] then please contact us. The fastest way to get your results is to register for My Chart.   IF you received an x-ray today, you will receive an invoice from [facility]. Please contact [facility] at [phone]  with questions or concerns regarding your invoice.   IF you received labwork today, you will receive an invoice from [place]. Please contact [facility] at [phone] with questions or concerns regarding your invoice.   Our billing staff will not be able to assist you with questions regarding bills from these companies.  You will be contacted with the lab results as soon as they are available. The fastest way to get your results is to activate your My Chart account. Instructions are located on the last page of this paperwork. If you have not heard from [place] regarding the results in 2 weeks, please contact this office.      Health Maintenance After Age 37 After age 101, you are at a higher risk for certain long-term diseases and infections as well as injuries from falls. Falls are a major cause of broken bones and head injuries in people who are older than age 41. Getting regular preventive care can help to keep you healthy and well. Preventive care includes getting regular testing and making lifestyle changes as recommended by your health care provider. Talk with your health care provider about:  Which screenings and tests you should have. A screening is a test that checks for a disease when you have no symptoms.  A diet and exercise plan that is right for you. What should I know about screenings and tests to prevent falls? Screening and testing are the best ways to find a health problem early. Early diagnosis and treatment give you the best chance of managing medical conditions that are common after age 47. Certain conditions and lifestyle choices may make you more likely to have a fall. Your health care provider may recommend:  Regular vision checks. Poor vision and conditions such as cataracts can make you more likely to have a fall. If you wear glasses, make sure to get your prescription updated if your vision changes.  Medicine review. Work with your health care provider to regularly review  all of the medicines you are taking, including over-the-counter medicines. Ask your health care provider about any side effects that may make you more likely to have a fall. Tell your health care provider if any medicines that you take make you feel dizzy or sleepy.  Osteoporosis screening. Osteoporosis is a condition that causes the bones to get weaker. This can make the bones weak and cause them to break more easily.  Blood pressure screening. Blood pressure changes and medicines to control blood pressure can make you feel dizzy.  Strength and balance checks. Your health care provider may recommend certain tests to check your strength and balance while standing, walking, or changing positions.  Foot health exam. Foot pain and numbness, as well as not wearing proper footwear, can make you more likely to have a fall.  Depression screening. You may be more likely to have a fall if you have a fear of falling, feel emotionally low, or feel unable to do activities that you used to do.  Alcohol use screening. Using too much alcohol can affect your balance and may make you more likely to have a fall. What actions can  I take to lower my risk of falls? General instructions  Talk with your health care provider about your risks for falling. Tell your health care provider if: ? You fall. Be sure to tell your health care provider about all falls, even ones that seem minor. ? You feel dizzy, sleepy, or off-balance.  Take over-the-counter and prescription medicines only as told by your health care provider. These include any supplements.  Eat a healthy diet and maintain a healthy weight. A healthy diet includes low-fat dairy products, low-fat (lean) meats, and fiber from whole grains, beans, and lots of fruits and vegetables. Home safety  Remove any tripping hazards, such as rugs, cords, and clutter.  Install safety equipment such as grab bars in bathrooms and safety rails on stairs.  Keep rooms and  walkways well-lit. Activity   Follow a regular exercise program to stay fit. This will help you maintain your balance. Ask your health care provider what types of exercise are appropriate for you.  If you need a cane or walker, use it as recommended by your health care provider.  Wear supportive shoes that have nonskid soles. Lifestyle  Do not drink alcohol if your health care provider tells you not to drink.  If you drink alcohol, limit how much you have: ? 0-1 drink a day for women. ? 0-2 drinks a day for men.  Be aware of how much alcohol is in your drink. In the [place] one drink equals one typical bottle of beer (12 oz), one-half glass of wine (5 oz), or one shot of hard liquor (1 oz).  Do not use any products that contain nicotine or tobacco, such as cigarettes and e-cigarettes. If you need help quitting, ask your health care provider. Summary  Having a healthy lifestyle and getting preventive care can help to protect your health and wellness after age 19.  Screening and testing are the best way to find a health problem early and help you avoid having a fall. Early diagnosis and treatment give you the best chance for managing medical conditions that are more common for people who are older than age 48.  Falls are a major cause of broken bones and head injuries in people who are older than age 86. Take precautions to prevent a fall at home.  Work with your health care provider to learn what changes you can make to improve your health and wellness and to prevent falls. This information is not intended to replace advice given to you by your health care provider. Make sure you discuss any questions you have with your health care provider. Document Released: 12/19/2016 Document Revised: 12/19/2016 Document Reviewed: 12/19/2016 Elsevier Interactive Patient Education  2019 Elsevier Inc.      Agustina Caroli, MD [facility]

## 2018-05-23 ENCOUNTER — Other Ambulatory Visit: Payer: Self-pay

## 2018-05-23 ENCOUNTER — Other Ambulatory Visit: Payer: Self-pay | Admitting: Urgent Care

## 2018-05-23 ENCOUNTER — Telehealth: Payer: Self-pay | Admitting: Emergency Medicine

## 2018-05-23 MED ORDER — CLOPIDOGREL BISULFATE 75 MG PO TABS: 75.0000 mg | ORAL_TABLET | Freq: Every day | ORAL | 3 refills | Status: DC

## 2018-05-23 NOTE — Telephone Encounter (Signed)
Copied from [facility] [place] [id number]. Topic: Quick Communication - Rx Refill/Question >> May 23, 2018  3:55 PM Sheppard Coil, [employer] L wrote: Medication:  clopidogrel (PLAVIX) 75 MG tablet  Has the patient contacted their pharmacy? Yes - needs new script (Agent: If no, request that the patient contact the pharmacy for the refill.) (Agent: If yes, when and what did the pharmacy advise?)  Preferred Pharmacy (with phone number or street name): [id number] - [place], [place] [phone] (Phone) [phone] (Fax)  Agent: Please be advised that RX refills may take up to 3 business days. We ask that you follow-up with your pharmacy.

## 2018-05-23 NOTE — Telephone Encounter (Signed)
Rx sent in

## 2018-08-05 ENCOUNTER — Telehealth: Payer: Self-pay

## 2018-08-05 NOTE — Telephone Encounter (Signed)
Called to screen the patient for appointment tomorrow. Spoke with both the patient, who does not speak [place] fluently, and his wife.  They understand he needs to wear a mask to the visit. Reviewed "no visitors" policy with Ms. Golubski, who is extremely upset she is not allowed to attend the visit.  Spoke with management who will call Ms. Hengel and speak directly.  COVID-19 Pre-Screening Questions:  . In the past 7 to 10 days have you had a cough,  shortness of breath, headache, congestion, fever (100 or greater) body aches, chills, sore throat, or sudden loss of taste or sense of smell? NO . Have you been around anyone with known Covid 19? NO . Have you been around anyone who is awaiting Covid 19 test results in the past 7 to 10 days? NO . Have you been around anyone who has been exposed to Covid 19, or has mentioned symptoms of Covid 19 within the past 7 to 10 days? NO  If you have any concerns/questions about symptoms patients report during screening (either on the phone or at threshold). Contact the provider seeing the patient or DOD for further guidance.  If neither are available contact a member of the leadership team.

## 2018-08-06 ENCOUNTER — Other Ambulatory Visit: Payer: Self-pay

## 2018-08-06 ENCOUNTER — Encounter: Payer: Self-pay | Admitting: Cardiovascular Disease

## 2018-08-06 ENCOUNTER — Ambulatory Visit (INDEPENDENT_AMBULATORY_CARE_PROVIDER_SITE_OTHER): Payer: Medicare HMO | Admitting: Cardiovascular Disease

## 2018-08-06 VITALS — BP 142/80 | HR 76 | Ht 66.0 in | Wt 189.8 lb

## 2018-08-06 DIAGNOSIS — I2510 Atherosclerotic heart disease of native coronary artery without angina pectoris: Secondary | ICD-10-CM

## 2018-08-06 DIAGNOSIS — I10 Essential (primary) hypertension: Secondary | ICD-10-CM | POA: Diagnosis not present

## 2018-08-06 DIAGNOSIS — I5032 Chronic diastolic (congestive) heart failure: Secondary | ICD-10-CM | POA: Diagnosis not present

## 2018-08-06 DIAGNOSIS — R0602 Shortness of breath: Secondary | ICD-10-CM

## 2018-08-06 MED ORDER — CARVEDILOL 12.5 MG PO TABS: 12.5000 mg | ORAL_TABLET | Freq: Two times a day (BID) | ORAL | 3 refills | Status: DC

## 2018-08-06 MED ORDER — VALSARTAN 160 MG PO TABS: 160.0000 mg | ORAL_TABLET | Freq: Every day | ORAL | 3 refills | Status: DC

## 2018-08-06 MED ORDER — ROSUVASTATIN CALCIUM 40 MG PO TABS: 40.0000 mg | ORAL_TABLET | Freq: Every day | ORAL | 3 refills | Status: DC

## 2018-08-06 MED ORDER — CLOPIDOGREL BISULFATE 75 MG PO TABS: 75.0000 mg | ORAL_TABLET | Freq: Every day | ORAL | 3 refills | Status: DC

## 2018-08-06 MED ORDER — POTASSIUM CHLORIDE CRYS ER 20 MEQ PO TBCR: 20.0000 meq | EXTENDED_RELEASE_TABLET | Freq: Every day | ORAL | 3 refills | Status: DC

## 2018-08-06 MED ORDER — FUROSEMIDE 40 MG PO TABS: 40.0000 mg | ORAL_TABLET | Freq: Every day | ORAL | 3 refills | Status: DC

## 2018-08-06 NOTE — Telephone Encounter (Signed)
I called Mrs. Hawker to reiterate our current [facility] Policy of No Visitors unless medically necessary. She was very understanding and felt that her husband spoke enough english to not require interpreter services. I let her know that she is welcome to call or send a MyChart message with any questions they may have after today's visit. She was appreciative and understanding. She will drop patient off downstairs at 2:05 for his appointment at 2:20p.

## 2018-08-06 NOTE — Patient Instructions (Signed)
Medication Instructions:  Your provider recommends that you continue on your current medications as directed. Please refer to the Current Medication list given to you today.    Labwork: TODAY: BMET, BNP  Testing/Procedures: None  Follow-Up: Your provider wants you to follow-up in: 6 months with Dr. Burt Knack. You will receive a reminder letter in the mail two months in advance. If you don't receive a letter, please call our office to schedule the follow-up appointment.

## 2018-08-06 NOTE — Progress Notes (Signed)
Cardiology Office Note:    Date:  08/07/2018   ID:  [name], DOB 1940/10/08, MRN [id number]  PCP:  Horald Pollen, MD  Cardiologist:  Sherren Mocha, MD  Electrophysiologist:  None   Referring MD: Horald Pollen, *   Chief Complaint  Patient presents with  . Shortness of Breath    History of Present Illness:    [name] is a 78 y.o. male with a hx of coronary artery disease with remote stenting of the left circumflex and OM branches in 2008.  The patient had recurrent angina in 2019 and was found to have severe multivessel coronary disease treated with CABG.  He developed a pericardial effusion that resolved over time and was monitored by serial echo.  He never developed cardiac tamponade.  The patient is here alone today.  When he was last seen in December 2019 he was having a lot of trouble with shortness of breath.  He was felt to have signs of diastolic heart failure and diuretics were increased.  He has clinically improved and continues to have only mild shortness of breath with activity, greatly improved from before.  He denies orthopnea, PND, or leg edema.  He denies chest pain or pressure.  He is compliant with his medications.  Past Medical History:  Diagnosis Date  . Blurred vision 05/17/2016  . CAD (coronary artery disease), native coronary artery 06/14/2008   a. BMS to LCx & OM 2008 b. 03/2017 CABG x 4 (LIMA to LAD, SVG to diag 1, SVG to distal Circ, SVG to PDA)  . Chest pain 04/16/2012  . Diplopia   . Echocardiogram    Echo 10/19: mod LVH, EF 55-60, no RWMA, Gr 1 DD, trivial AI, MAC, mod LAE, prob small post effusion  . Essential hypertension 05/17/2016  . Hyperlipidemia with target low density lipoprotein (LDL) cholesterol less than 70 mg/dL 06/14/2008   Qualifier: Diagnosis of  By: Mare Ferrari, RMA, [name]    . Hypertension   . Hypertensive heart disease 06/14/2008   Qualifier: Diagnosis of  By: Mare Ferrari, [place], [name]    . Hypertensive urgency,  malignant 04/16/2012  . Hypokalemia 04/14/2017  . Internal hemorrhoids 04/19/2012  . Ischemic stroke ([place]) 05/02/2016  . Non-ST elevation (NSTEMI) myocardial infarction ([place])   . pandiverticulosis 04/22/2012   12/17/2011. [place]. Juanita Craver MD. Colonoscopy. Moderate sized internal hemorrhoids and extensive pandiverticulosis. Repeat 5 years.    . Pericardial effusion    Echo 11/19: mild focal basal septal hypertrophy, EF 55-60, Gr 2 DD, trivial AI, trivial TR, trivial PI, small to mod eff post to heart - no evidence of RV collapse.>> repeat limited echo in 03/2018 // Echo 03/2018: EF 55-60, mild LVH, +diastolic dysfunction, no RWMA, PASP 23, trivial pericardial effusion   . Polycythemia vera(238.4) 04/17/2012  . S/P CABG x 4 04/17/2017  . Syncope and collapse 04/17/2012   Pt syncopized while sitting in bed giving history @ time of admission to hospital Tachycardic (appeared sinus) to 120s-130s and hypotensive to 50s/30s.  Unresponsive initially >> Spontaneously resolved after 2-3 minutes >> return to baseline ~30 minutes   . Vertigo     Past Surgical History:  Procedure Laterality Date  . CORONARY ARTERY BYPASS GRAFT N/A 04/17/2017   Procedure: CORONARY ARTERY BYPASS GRAFTING (CABG) x4 , using left internal mammary artery  to LAD and right leg greater saphenous vein harvested endoscopically  to PDA, Diagonal I and Circumflex;  Surgeon: [name], MD;  Location: [facility];  Service: Open Heart Surgery;  Laterality: N/A;  . CORONARY STENT PLACEMENT    . LEFT HEART CATH AND CORONARY ANGIOGRAPHY N/A 04/16/2017   Procedure: LEFT HEART CATH AND CORONARY ANGIOGRAPHY;  Surgeon: Sherren Mocha, MD;  Location: [facility];  Service: Cardiovascular;  Laterality: N/A;  . TEE WITHOUT CARDIOVERSION N/A 04/17/2017   Procedure: TRANSESOPHAGEAL ECHOCARDIOGRAM (TEE);  Surgeon: [name], MD;  Location: [facility];  Service: Open Heart Surgery;  Laterality: N/A;    Current Medications:  Current Meds  Medication Sig  . acetaminophen (TYLENOL) 325 MG tablet Take 2 tablets (650 mg total) by mouth every 6 (six) hours as needed for mild pain.  Marland Kitchen ALPRAZolam (XANAX) 0.25 MG tablet Take 1 tablet (0.25 mg total) by mouth 2 (two) times daily as needed for anxiety.  Marland Kitchen aspirin EC 81 MG tablet Take 81 mg by mouth daily.  . carvedilol (COREG) 12.5 MG tablet Take 1 tablet (12.5 mg total) by mouth 2 (two) times daily.  . clopidogrel (PLAVIX) 75 MG tablet Take 1 tablet (75 mg total) by mouth daily.  . furosemide (LASIX) 40 MG tablet Take 1 tablet (40 mg total) by mouth daily.  . potassium chloride SA (K-DUR) 20 MEQ tablet Take 1 tablet (20 mEq total) by mouth daily.  . rosuvastatin (CRESTOR) 40 MG tablet Take 1 tablet (40 mg total) by mouth daily. Dose change  . valsartan (DIOVAN) 160 MG tablet Take 1 tablet (160 mg total) by mouth daily. Dose change  . [DISCONTINUED] carvedilol (COREG) 12.5 MG tablet Take 1 tablet (12.5 mg total) by mouth 2 (two) times daily.  . [DISCONTINUED] clopidogrel (PLAVIX) 75 MG tablet Take 1 tablet (75 mg total) by mouth daily.  . [DISCONTINUED] furosemide (LASIX) 40 MG tablet Take 40 mg by mouth daily.  . [DISCONTINUED] potassium chloride SA (K-DUR,KLOR-CON) 20 MEQ tablet Take 1 tablet (20 mEq total) by mouth daily.  . [DISCONTINUED] rosuvastatin (CRESTOR) 40 MG tablet Take 1 tablet (40 mg total) by mouth daily. Dose change  . [DISCONTINUED] valsartan (DIOVAN) 160 MG tablet Take 1 tablet (160 mg total) by mouth daily. Dose change     Allergies:   Lipitor [atorvastatin], Morphine and related, and Pork-derived products   Social History   Socioeconomic History  . Marital status: Married    Spouse name: Not on file  . Number of children: Not on file  . Years of education: Not on file  . Highest education level: Not on file  Occupational History  . Not on file  Social Needs  . Financial resource strain: Not on file  . Food insecurity    Worry: Not on file     Inability: Not on file  . Transportation needs    Medical: Not on file    Non-medical: Not on file  Tobacco Use  . Smoking status: Never Smoker  . Smokeless tobacco: Never Used  Substance and Sexual Activity  . Alcohol use: No    Alcohol/week: 0.0 standard drinks  . Drug use: No  . Sexual activity: Not on file  Lifestyle  . Physical activity    Days per week: Not on file    Minutes per session: Not on file  . Stress: Not on file  Relationships  . Social Herbalist on phone: Not on file    Gets together: Not on file    Attends religious service: Not on file    Active member of club or organization: Not on file  Attends meetings of clubs or organizations: Not on file    Relationship status: Not on file  Other Topics Concern  . Not on file  Social History Narrative   Lives in [place] with Wife and 2 sons.  From [place].  To [place] ~2000.     Currently retired but worked in Veterinary surgeon     Family History: The patient's family history includes Cancer in his sister; Heart disease in his mother.  ROS:   Please see the history of present illness.    All other systems reviewed and are negative.  EKGs/Labs/Other Studies Reviewed:    The following studies were reviewed today: Echo 04/09/2018: IMPRESSIONS    1. The left ventricle has normal systolic function, with an ejection fraction of 55-60%. The cavity size was normal. There is mildly increased left ventricular wall thickness. Left ventricular diastolic Doppler parameters are consistent with impaired  relaxation No evidence of left ventricular regional wall motion abnormalities.  2. The right ventricle has normal systolc function. The cavity was normal. There is no increase in right ventricular wall thickness.  3. Trivial pericardial effusion is present.  4. The mitral valve is normal in structure. No evidence of mitral valve stenosis. No significant mitral regurgitation.  5. The tricuspid valve was  normal in structure.  6. The pulmonic valve was normal in structure.  7. The ascending aorta is normal in size and structure.  8. There is mild dilatation of the aortic root measuring 40 mm.  9. IVC normal. PA systolic pressure 23 mmHg.  FINDINGS  Left Ventricle: The left ventricle has normal systolic function, with an ejection fraction of 55-60%. The cavity size was normal. There is mildly increased left ventricular wall thickness. Left ventricular diastolic Doppler parameters are consistent  with impaired relaxation (grade I) No evidence of left ventricular regional wall motion abnormalities. Right Ventricle: The right ventricle has normal systolic function. The cavity was normal. There is no increase in right ventricular wall thickness.    Left Atrium: Left atrial size was normal in size. Pericardium: Trivial pericardial effusion is present. Mitral Valve: The mitral valve is normal in structure. Mitral valve regurgitation is not visualized by color flow Doppler. No evidence of mitral valve stenosis. Tricuspid Valve: The tricuspid valve was normal in structure. Tricuspid valve regurgitation is trivial by color flow Doppler. Aortic Valve: The aortic valve is tricuspid Aortic valve regurgitation is trivial by color flow Doppler. There is no stenosis of the aortic valve. Pulmonic Valve: The pulmonic valve was normal in structure. Pulmonic valve regurgitation is not visualized by color flow Doppler. Aorta: The ascending aorta is normal in size and structure. There is mild dilatation of the aortic root measuring 40 mm. Venous: The inferior vena cava is normal in size with greater than 50% respiratory variability.   Recent Labs: 11/04/2017: ALT 13 11/05/2017: Hemoglobin 13.6; Magnesium 2.1; Platelets 202 11/12/2017: BNP 181.7 01/31/2018: NT-Pro BNP 405 02/14/2018: BUN 14; Creatinine, Ser 0.86; Potassium 4.6; Sodium 140  Recent Lipid Panel    Component Value Date/Time   CHOL 101 05/29/2017  1013   TRIG 84 05/29/2017 1013   HDL 35 (L) 05/29/2017 1013   CHOLHDL 2.9 05/29/2017 1013   CHOLHDL 4.5 04/15/2017 0241   VLDL 9 04/15/2017 0241   LDLCALC 49 05/29/2017 1013   LDLDIRECT 150.8 06/30/2008 1007    Physical Exam:    VS:  BP (!) 142/80   Pulse 76   Ht _0  (1.676 m)   Wt 189  lb 12.8 oz (86.1 kg)   SpO2 96%   BMI 30.63 kg/m     Wt Readings from Last 3 Encounters:  08/06/18 189 lb 12.8 oz (86.1 kg)  05/09/18 191 lb (86.6 kg)  02/14/18 190 lb 1.9 oz (86.2 kg)     GEN:  Well nourished, well developed in no acute distress HEENT: Normal NECK: No JVD; No carotid bruits LYMPHATICS: No lymphadenopathy CARDIAC: RRR, no murmurs, rubs, gallops RESPIRATORY:  Clear to auscultation without rales, wheezing or rhonchi  ABDOMEN: Soft, non-tender, non-distended MUSCULOSKELETAL: Trace bilateral pretibial edema; No deformity  SKIN: Warm and dry NEUROLOGIC:  Alert and oriented x 3 PSYCHIATRIC:  Normal affect   ASSESSMENT:    1. Shortness of breath   2. Essential hypertension   3. Chronic diastolic heart failure ([place])   4. Coronary artery disease involving native coronary artery of native heart without angina pectoris    PLAN:    In order of problems listed above:  1. Overall the patient is doing much better on his current medical program.  There are no signs of volume overload on exam today.  We will check a metabolic panel and BNP. 2. Blood pressure is well controlled on valsartan, carvedilol, and furosemide.  Check BNP today. 3. As above 4. No anginal symptoms.  He remains on dual antiplatelet therapy with aspirin and clopidogrel after non-STEMI last year.  He remains on a high intensity statin drug with rosuvastatin 40 mg.   Medication Adjustments/Labs and Tests Ordered: Current medicines are reviewed at length with the patient today.  Concerns regarding medicines are outlined above.  Orders Placed This Encounter  Procedures  . Basic metabolic panel  . Pro b  natriuretic peptide (BNP)   Meds ordered this encounter  Medications  . valsartan (DIOVAN) 160 MG tablet    Sig: Take 1 tablet (160 mg total) by mouth daily. Dose change    Dispense:  90 tablet    Refill:  3  . rosuvastatin (CRESTOR) 40 MG tablet    Sig: Take 1 tablet (40 mg total) by mouth daily. Dose change    Dispense:  90 tablet    Refill:  3  . potassium chloride SA (K-DUR) 20 MEQ tablet    Sig: Take 1 tablet (20 mEq total) by mouth daily.    Dispense:  90 tablet    Refill:  3  . furosemide (LASIX) 40 MG tablet    Sig: Take 1 tablet (40 mg total) by mouth daily.    Dispense:  90 tablet    Refill:  3  . clopidogrel (PLAVIX) 75 MG tablet    Sig: Take 1 tablet (75 mg total) by mouth daily.    Dispense:  90 tablet    Refill:  3  . carvedilol (COREG) 12.5 MG tablet    Sig: Take 1 tablet (12.5 mg total) by mouth 2 (two) times daily.    Dispense:  180 tablet    Refill:  3    Patient Instructions  Medication Instructions:  Your provider recommends that you continue on your current medications as directed. Please refer to the Current Medication list given to you today.    Labwork: TODAY: BMET, BNP  Testing/Procedures: None  Follow-Up: Your provider wants you to follow-up in: 6 months with Dr. Burt Knack. You will receive a reminder letter in the mail two months in advance. If you don't receive a letter, please call our office to schedule the follow-up appointment.      Signed,  Sherren Mocha, MD  08/07/2018 6:45 AM    [place]

## 2018-08-07 ENCOUNTER — Encounter: Payer: Self-pay | Admitting: Cardiovascular Disease

## 2018-08-07 LAB — BASIC METABOLIC PANEL
BUN/Creatinine Ratio: 14 (ref 10–24)
BUN: 13 mg/dL (ref 8–27)
CO2: 24 mmol/L (ref 20–29)
Calcium: 9.6 mg/dL (ref 8.6–10.2)
Chloride: 102 mmol/L (ref 96–106)
Creatinine, Ser: 0.91 mg/dL (ref 0.76–1.27)
GFR calc Af Amer: 94 mL/min/{1.73_m2} (ref >59)
GFR calc non Af Amer: 82 mL/min/{1.73_m2} (ref >59)
Glucose: 99 mg/dL (ref 65–99)
Potassium: 4 mmol/L (ref 3.5–5.2)
Sodium: 140 mmol/L (ref 134–144)

## 2018-08-07 LAB — PRO B NATRIURETIC PEPTIDE: NT-Pro BNP: 234 pg/mL (ref 0–486)

## 2018-08-15 ENCOUNTER — Telehealth: Payer: Self-pay | Admitting: Emergency Medicine

## 2018-08-15 NOTE — Telephone Encounter (Signed)
Medication: potassium chloride SA (K-DUR) 20 MEQ tablet [[id number]] , furosemide (LASIX) 40 MG tablet [[id number]]   Has the patient contacted their pharmacy? Yes  (Agent: If no, request that the patient contact the pharmacy for the refill.) (Agent: If yes, when and what did the pharmacy advise?)  Preferred Pharmacy (with phone number or street name): [facility], [place] [phone] (Phone) [phone] (Fax)    Agent: Please be advised that RX refills may take up to 3 business days. We ask that you follow-up with your pharmacy.

## 2018-08-18 NOTE — Telephone Encounter (Signed)
Medication has been sent to pharmacy by another provider.

## 2018-08-26 ENCOUNTER — Other Ambulatory Visit: Payer: Self-pay

## 2018-08-26 ENCOUNTER — Other Ambulatory Visit: Payer: Self-pay | Admitting: Cardiovascular Disease

## 2018-08-26 MED ORDER — FUROSEMIDE 40 MG PO TABS: 40.0000 mg | ORAL_TABLET | Freq: Every day | ORAL | 3 refills | Status: DC

## 2018-08-26 MED ORDER — POTASSIUM CHLORIDE CRYS ER 20 MEQ PO TBCR: 20.0000 meq | EXTENDED_RELEASE_TABLET | Freq: Every day | ORAL | 3 refills | Status: DC

## 2018-11-10 ENCOUNTER — Ambulatory Visit: Payer: Medicare HMO | Admitting: Emergency Medicine

## 2018-11-11 ENCOUNTER — Encounter: Payer: Self-pay | Admitting: Emergency Medicine

## 2019-02-04 ENCOUNTER — Telehealth: Payer: Self-pay | Admitting: Cardiovascular Disease

## 2019-02-04 NOTE — Telephone Encounter (Signed)
New Message  Pt's daughter is calling and says she needs to go with father to appt because of his mental capabilities.  Please call to discuss

## 2019-02-05 ENCOUNTER — Other Ambulatory Visit: Payer: Self-pay

## 2019-02-05 ENCOUNTER — Ambulatory Visit: Payer: Medicare HMO | Admitting: Cardiovascular Disease

## 2019-02-05 ENCOUNTER — Encounter: Payer: Self-pay | Admitting: Cardiovascular Disease

## 2019-02-05 VITALS — BP 150/84 | HR 73 | Ht 66.0 in | Wt 183.0 lb

## 2019-02-05 DIAGNOSIS — I5032 Chronic diastolic (congestive) heart failure: Secondary | ICD-10-CM

## 2019-02-05 DIAGNOSIS — I110 Hypertensive heart disease with heart failure: Secondary | ICD-10-CM | POA: Diagnosis not present

## 2019-02-05 DIAGNOSIS — I10 Essential (primary) hypertension: Secondary | ICD-10-CM | POA: Diagnosis not present

## 2019-02-05 DIAGNOSIS — I2510 Atherosclerotic heart disease of native coronary artery without angina pectoris: Secondary | ICD-10-CM | POA: Diagnosis not present

## 2019-02-05 IMAGING — DX DG CHEST 1V PORT
1 series · 1 of 1 positions shown · non-contrast
Comparison: 04/17/2017

CLINICAL DATA: Routine morning portable, left heart cath 04/16/17,
hx of CAD

EXAM:
PORTABLE CHEST 1 VIEW

[chest ap]
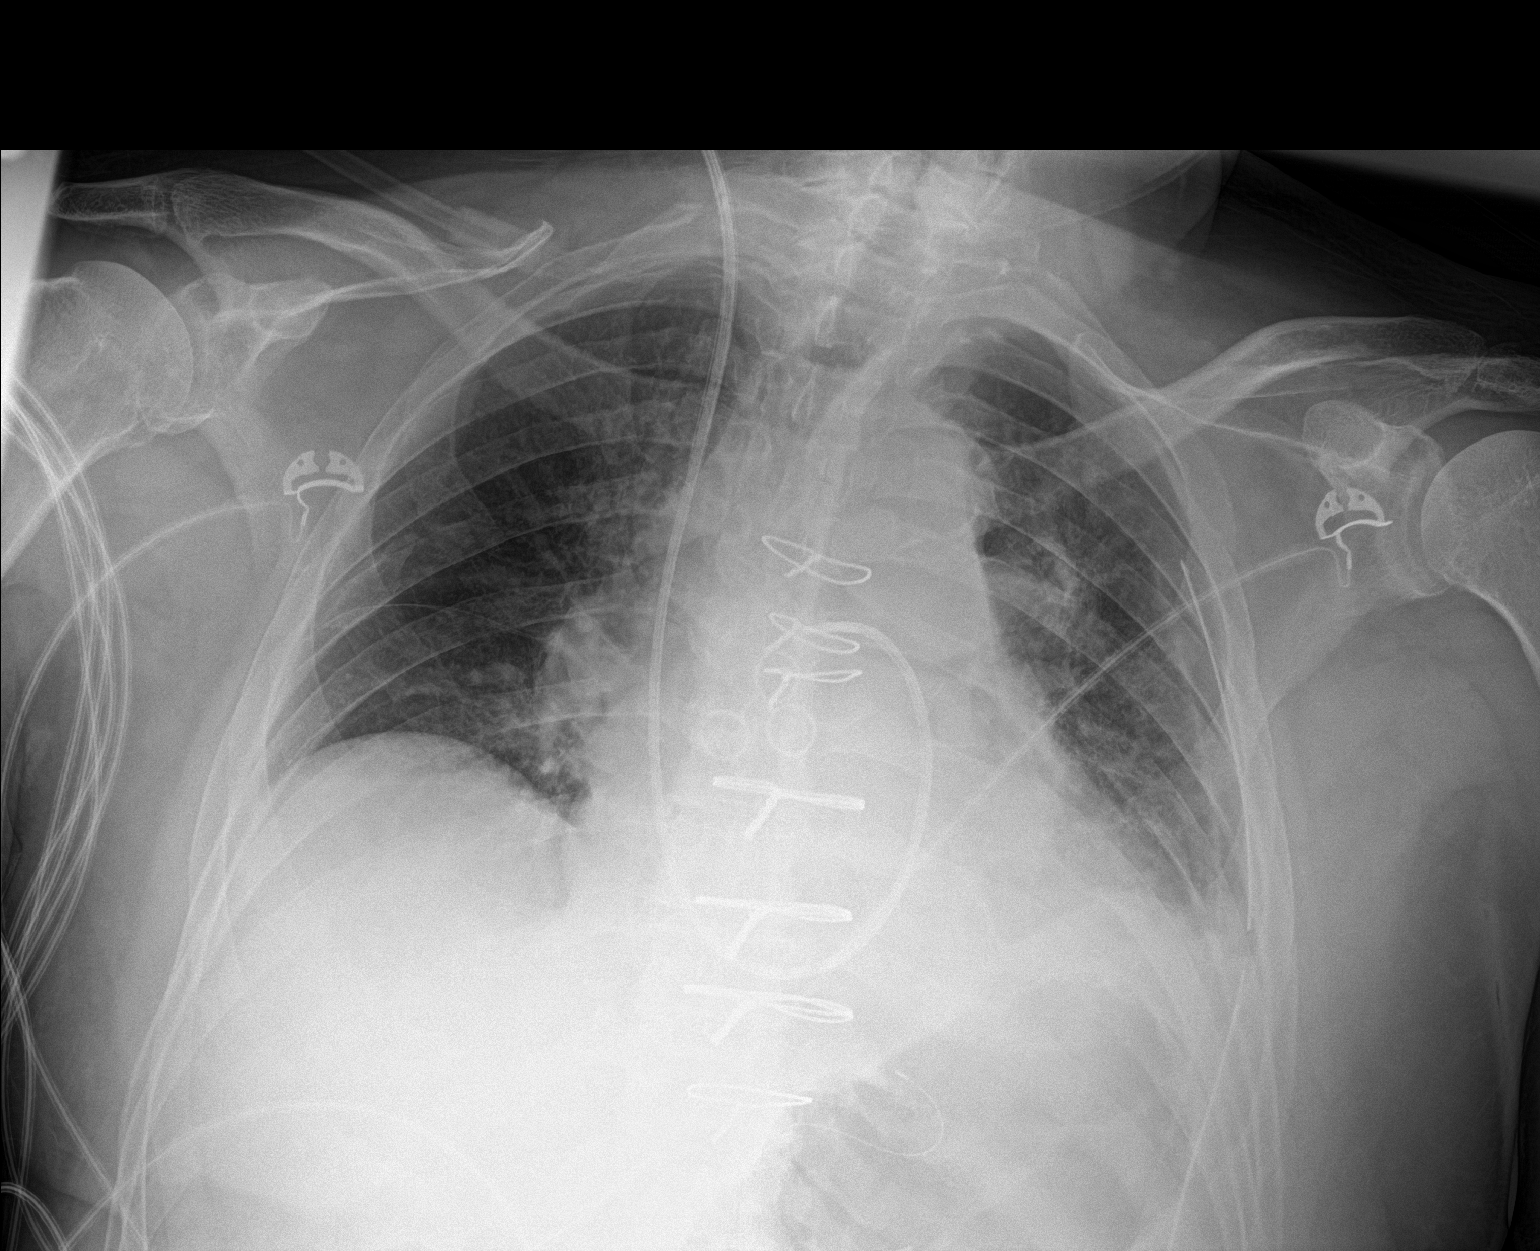

[1 of 1 positions shown; findings below may reference images not displayed]

FINDINGS: Interval extubation. No increase in atelectasis. Removal of NG tube.
LEFT chest tube in place without pneumothorax. Swan-Ganz catheter
with tip in the main pulmonary artery. Persistent mild LEFT basilar
atelectasis. No pulmonary edema.
IMPRESSION: Extubation without complication.

## 2019-02-05 MED ORDER — POTASSIUM CHLORIDE CRYS ER 20 MEQ PO TBCR: 20.0000 meq | EXTENDED_RELEASE_TABLET | ORAL | 3 refills | Status: DC

## 2019-02-05 MED ORDER — FUROSEMIDE 40 MG PO TABS: 40.0000 mg | ORAL_TABLET | ORAL | 3 refills | Status: DC

## 2019-02-05 NOTE — Telephone Encounter (Signed)
The patient's daughter understands Dr. Burt Knack will call her during the visit to honor social distancing measures in place. She was grateful for assistance.

## 2019-02-05 NOTE — Progress Notes (Signed)
Cardiology Office Note:    Date:  02/05/2019   ID:  [name], DOB [birth year], MRN [id number]  PCP:  Horald Pollen, MD  Cardiologist:  Sherren Mocha, MD  Electrophysiologist:  None   Referring MD: Horald Pollen, *   Chief Complaint  Patient presents with  . Coronary Artery Disease    History of Present Illness:    [name] is a 78 y.o. male with a hx of coronary artery disease, presenting for follow-up evaluation.  The patient initially presented with unstable angina and underwent stenting of the left circumflex and OM branches in 2008.  He developed recurrent severe angina in 2019 and was found to have progressive and severe multivessel CAD, treated with CABG.  He is here today for follow-up evaluation.  He has had a pericardial effusion but this resolved on his most recent echo study in February 2020.  He presents today for follow-up evaluation.  He is here alone and we are conferencing his daughter in on speaker phone.  He has been doing pretty well from a cardiac perspective.  He denies chest pain, shortness of breath, edema, heart palpitations, lightheadedness, or syncope.  He has been having trouble with urinary incontinence and frequent urination.  This can occur throughout the day and he also wakes up multiple times at night.  He denies urinary hesitancy.  Past Medical History:  Diagnosis Date  . Blurred vision 05/17/2016  . CAD (coronary artery disease), native coronary artery 06/14/2008   a. BMS to LCx & OM 2008 b. 03/2017 CABG x 4 (LIMA to LAD, SVG to diag 1, SVG to distal Circ, SVG to PDA)  . Chest pain 04/16/2012  . Diplopia   . Echocardiogram    Echo 10/19: mod LVH, EF 55-60, no RWMA, Gr 1 DD, trivial AI, MAC, mod LAE, prob small post effusion  . Essential hypertension 05/17/2016  . Hyperlipidemia with target low density lipoprotein (LDL) cholesterol less than 70 mg/dL 06/14/2008   Qualifier: Diagnosis of  By: Mare Ferrari, RMA, [name]    .  Hypertension   . Hypertensive heart disease 06/14/2008   Qualifier: Diagnosis of  By: Mare Ferrari, [place], [name]    . Hypertensive urgency, malignant 04/16/2012  . Hypokalemia 04/14/2017  . Internal hemorrhoids 04/19/2012  . Ischemic stroke ([place]) 05/02/2016  . Non-ST elevation (NSTEMI) myocardial infarction ([place])   . pandiverticulosis 04/22/2012   12/17/2011. [place] [facility]. Juanita Craver MD. Colonoscopy. Moderate sized internal hemorrhoids and extensive pandiverticulosis. Repeat 5 years.    . Pericardial effusion    Echo 11/19: mild focal basal septal hypertrophy, EF 55-60, Gr 2 DD, trivial AI, trivial TR, trivial PI, small to mod eff post to heart - no evidence of RV collapse.>> repeat limited echo in 03/2018 // Echo 03/2018: EF 55-60, mild LVH, +diastolic dysfunction, no RWMA, PASP 23, trivial pericardial effusion   . Polycythemia vera(238.4) 04/17/2012  . S/P CABG x 4 04/17/2017  . Syncope and collapse 04/17/2012   Pt syncopized while sitting in bed giving history @ time of admission to hospital Tachycardic (appeared sinus) to 120s-130s and hypotensive to 50s/30s.  Unresponsive initially >> Spontaneously resolved after 2-3 minutes >> return to baseline ~30 minutes   . Vertigo     Past Surgical History:  Procedure Laterality Date  . CORONARY ARTERY BYPASS GRAFT N/A 04/17/2017   Procedure: CORONARY ARTERY BYPASS GRAFTING (CABG) x4 , using left internal mammary artery  to LAD and right leg greater saphenous vein harvested endoscopically  to PDA, Diagonal  I and Circumflex;  Surgeon: [name], MD;  Location: [facility];  Service: Open Heart Surgery;  Laterality: N/A;  . CORONARY STENT PLACEMENT    . LEFT HEART CATH AND CORONARY ANGIOGRAPHY N/A 04/16/2017   Procedure: LEFT HEART CATH AND CORONARY ANGIOGRAPHY;  Surgeon: Sherren Mocha, MD;  Location: [facility];  Service: Cardiovascular;  Laterality: N/A;  . TEE WITHOUT CARDIOVERSION N/A 04/17/2017   Procedure: TRANSESOPHAGEAL ECHOCARDIOGRAM  (TEE);  Surgeon: [name], MD;  Location: [facility];  Service: Open Heart Surgery;  Laterality: N/A;    Current Medications: Current Meds  Medication Sig  . acetaminophen (TYLENOL) 325 MG tablet Take 2 tablets (650 mg total) by mouth every 6 (six) hours as needed for mild pain.  Marland Kitchen ALPRAZolam (XANAX) 0.25 MG tablet Take 1 tablet (0.25 mg total) by mouth 2 (two) times daily as needed for anxiety.  Marland Kitchen aspirin EC 81 MG tablet Take 81 mg by mouth daily.  . carvedilol (COREG) 12.5 MG tablet Take 1 tablet (12.5 mg total) by mouth 2 (two) times daily.  . clopidogrel (PLAVIX) 75 MG tablet Take 1 tablet (75 mg total) by mouth daily.  . furosemide (LASIX) 40 MG tablet Take 1 tablet (40 mg total) by mouth daily. Fluid  . potassium chloride SA (K-DUR) 20 MEQ tablet Take 1 tablet (20 mEq total) by mouth daily. Potassium  . rosuvastatin (CRESTOR) 40 MG tablet Take 1 tablet (40 mg total) by mouth daily. Dose change  . valsartan (DIOVAN) 160 MG tablet Take 1 tablet (160 mg total) by mouth daily. Dose change     Allergies:   Lipitor [atorvastatin], Morphine and related, and Pork-derived products   Social History   Socioeconomic History  . Marital status: Married    Spouse name: Not on file  . Number of children: Not on file  . Years of education: Not on file  . Highest education level: Not on file  Occupational History  . Not on file  Tobacco Use  . Smoking status: Never Smoker  . Smokeless tobacco: Never Used  Substance and Sexual Activity  . Alcohol use: No    Alcohol/week: 0.0 standard drinks  . Drug use: No  . Sexual activity: Not on file  Other Topics Concern  . Not on file  Social History Narrative   Lives in [place] with Wife and 2 sons.  From [place].  To [place] ~2000.     Currently retired but worked in Veterinary surgeon   Social Determinants of Radio broadcast assistant Strain:   . Difficulty of Paying Living Expenses: Not on file  Food Insecurity:   . Worried  About Charity fundraiser in the Last Year: Not on file  . Ran Out of Food in the Last Year: Not on file  Transportation Needs:   . Lack of Transportation (Medical): Not on file  . Lack of Transportation (Non-Medical): Not on file  Physical Activity:   . Days of Exercise per Week: Not on file  . Minutes of Exercise per Session: Not on file  Stress:   . Feeling of Stress : Not on file  Social Connections:   . Frequency of Communication with Friends and Family: Not on file  . Frequency of Social Gatherings with Friends and Family: Not on file  . Attends Religious Services: Not on file  . Active Member of Clubs or Organizations: Not on file  . Attends Archivist Meetings: Not on file  .  Marital Status: Not on file     Family History: The patient's family history includes Cancer in his sister; Heart disease in his mother.  ROS:   Please see the history of present illness.    All other systems reviewed and are negative.  EKGs/Labs/Other Studies Reviewed:    The following studies were reviewed today: 2D echocardiogram 04/09/2018: IMPRESSIONS    1. The left ventricle has normal systolic function, with an ejection fraction of 55-60%. The cavity size was normal. There is mildly increased left ventricular wall thickness. Left ventricular diastolic Doppler parameters are consistent with impaired  relaxation No evidence of left ventricular regional wall motion abnormalities.  2. The right ventricle has normal systolc function. The cavity was normal. There is no increase in right ventricular wall thickness.  3. Trivial pericardial effusion is present.  4. The mitral valve is normal in structure. No evidence of mitral valve stenosis. No significant mitral regurgitation.  5. The tricuspid valve was normal in structure.  6. The pulmonic valve was normal in structure.  7. The ascending aorta is normal in size and structure.  8. There is mild dilatation of the aortic root measuring  40 mm.  9. IVC normal. PA systolic pressure 23 mmHg.  EKG:  EKG is ordered today.  The ekg ordered today demonstrates normal sinus rhythm 73 bpm, ST/T wave abnormality consider lateral ischemia, otherwise normal.  Recent Labs: 08/06/2018: BUN 13; Creatinine, Ser 0.91; NT-Pro BNP 234; Potassium 4.0; Sodium 140  Recent Lipid Panel    Component Value Date/Time   CHOL 101 05/29/2017 1013   TRIG 84 05/29/2017 1013   HDL 35 (L) 05/29/2017 1013   CHOLHDL 2.9 05/29/2017 1013   CHOLHDL 4.5 04/15/2017 0241   VLDL 9 04/15/2017 0241   LDLCALC 49 05/29/2017 1013   LDLDIRECT 150.8 06/30/2008 1007    Physical Exam:    VS:  BP (!) 150/84   Pulse 73   Ht _0  (1.676 m)   Wt 183 lb (83 kg)   SpO2 98%   BMI 29.54 kg/m     Wt Readings from Last 3 Encounters:  02/05/19 183 lb (83 kg)  08/06/18 189 lb 12.8 oz (86.1 kg)  05/09/18 191 lb (86.6 kg)     GEN:  Well nourished, well developed in no acute distress HEENT: Normal NECK: No JVD; No carotid bruits LYMPHATICS: No lymphadenopathy CARDIAC: RRR, no murmurs, rubs, gallops RESPIRATORY:  Clear to auscultation without rales, wheezing or rhonchi  ABDOMEN: Soft, non-tender, non-distended MUSCULOSKELETAL:  No edema; No deformity  SKIN: Warm and dry NEUROLOGIC:  Alert and oriented x 3 PSYCHIATRIC:  Normal affect   ASSESSMENT:    1. Coronary artery disease involving native coronary artery of native heart without angina pectoris   2. Chronic diastolic heart failure ([place])   3. Essential hypertension   4. Mixed hyperlipidemia    PLAN:    In order of problems listed above:  1. Stable on current medical regimen.  He will continue aspirin and Plavix for antiplatelet therapy, carvedilol, and a high intensity statin drug. 2. Appears euvolemic on exam with no major functional limitation at present.  He is having a lot of problems with urinary frequency and incontinence.  I asked him to reduce his furosemide to every other day dosing.  He will do  the same with potassium chloride.  We will check an echo prior to his next office visit in 6 months. 3. Continue carvedilol and valsartan 4. Continue high intensity statin  drug.  Cholesterol 101, HDL 35, LDL 49, triglycerides 84 at time of last lipid panel.  LFTs have been normal over time.   Medication Adjustments/Labs and Tests Ordered: Current medicines are reviewed at length with the patient today.  Concerns regarding medicines are outlined above.  No orders of the defined types were placed in this encounter.  No orders of the defined types were placed in this encounter.   There are no Patient Instructions on file for this visit.   Signed, Sherren Mocha, MD  02/05/2019 3:32 PM    [place]

## 2019-02-05 NOTE — Patient Instructions (Signed)
Medication Instructions:  1) DECREASE LASIX (furosemide) to taking one tablet every other day 2) DECREASE KDUR (potassium supplement) to one tablet every other day *If you need a refill on your cardiac medications before your next appointment, please call your pharmacy*  Follow-Up: You will be called to arrange an echo and office visit with Dr. Burt Knack when his schedule is ready for June 2021.

## 2019-02-06 IMAGING — DX DG CHEST 1V PORT
1 series · 1 of 1 positions shown · non-contrast
Comparison: Yesterday

CLINICAL DATA: Chest tube placement

EXAM:
PORTABLE CHEST 1 VIEW

[chest ap]
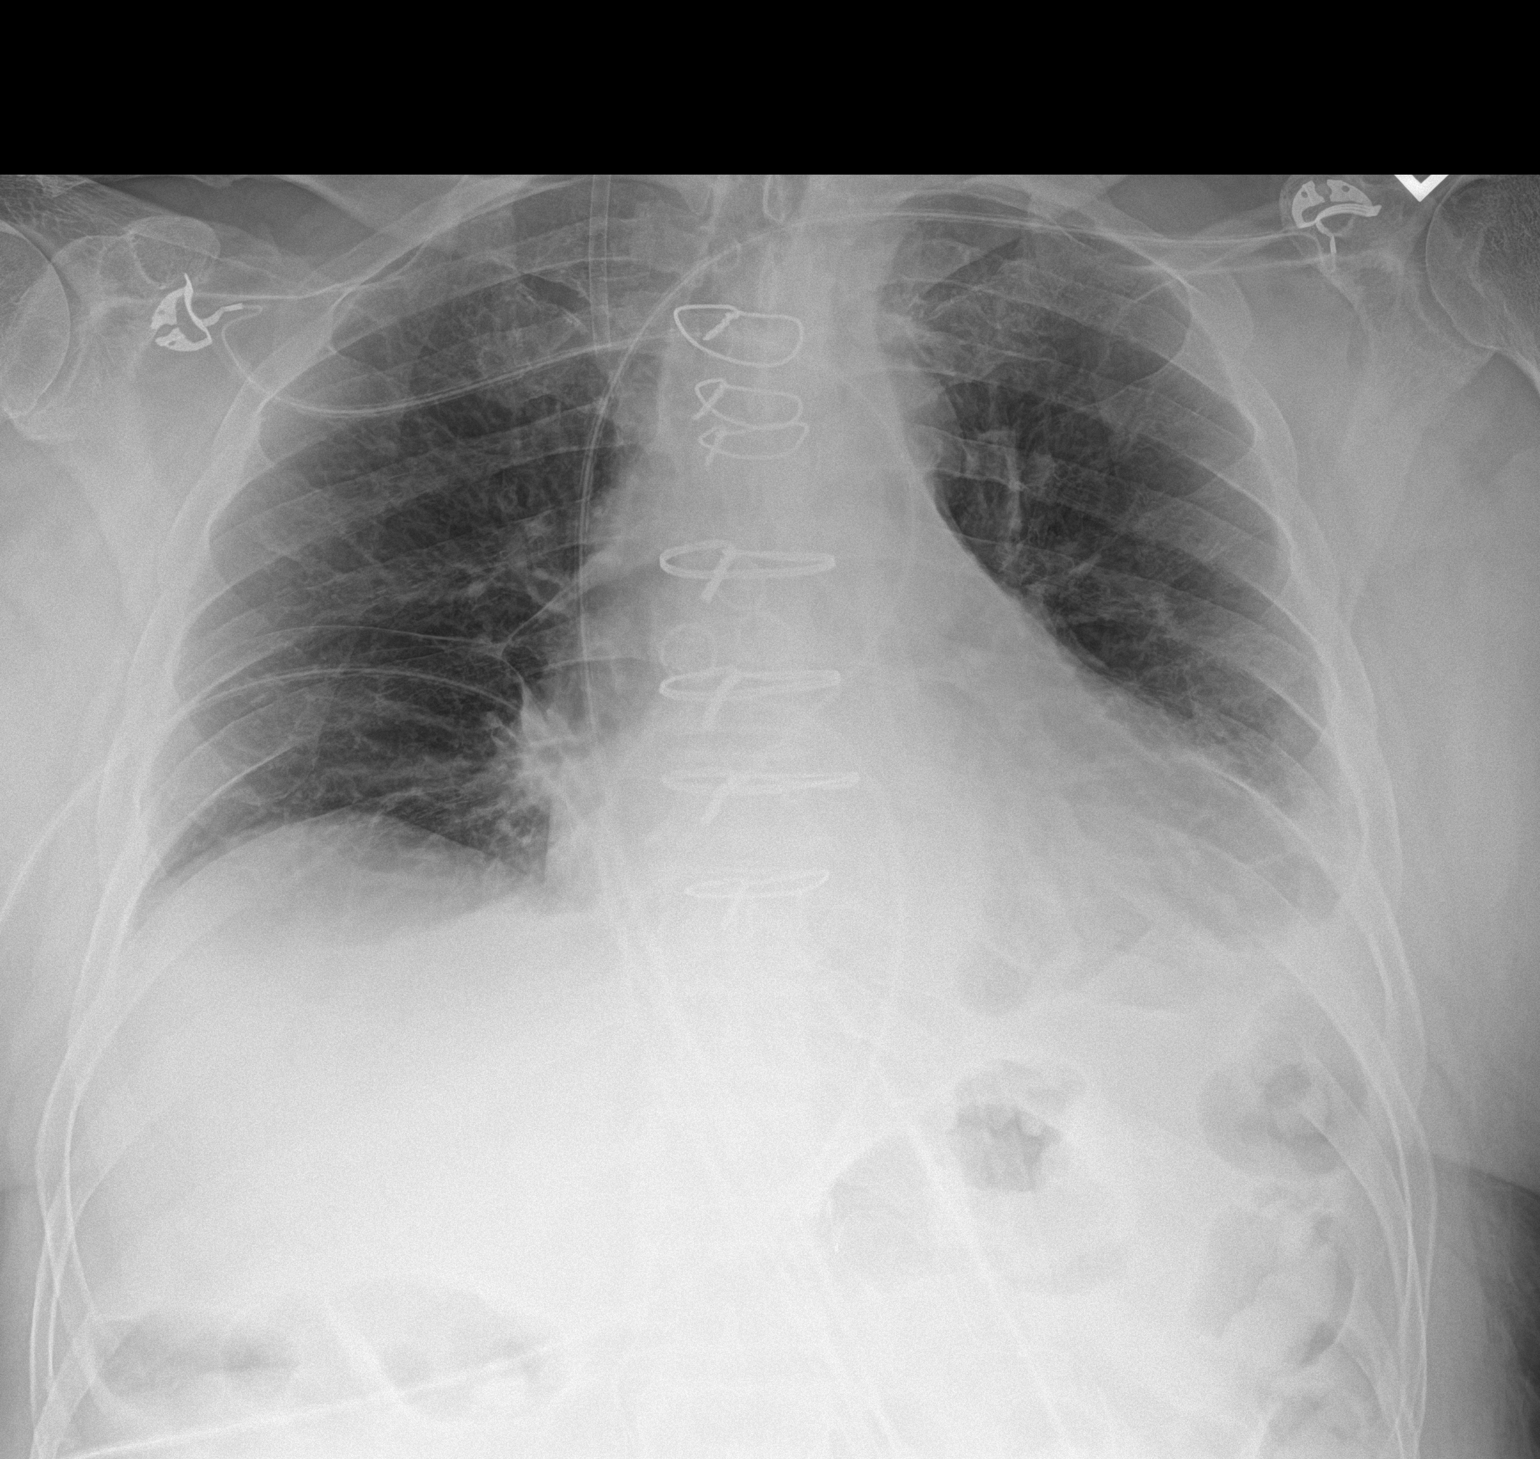

[1 of 1 positions shown; findings below may reference images not displayed]

FINDINGS: Swan-Ganz catheter has been removed from the stably located right IJ
sheath. Left chest tube is been removed. No pneumothorax. Stable low
volumes with small left effusion. Stable heart size after CABG.
IMPRESSION: 1. Removed chest tube with no visible pneumothorax.
2. Stable low volumes with small effusion on the left.

## 2019-02-10 IMAGING — DX DG CHEST 2V
2 series · 2 of 2 positions shown · non-contrast
Comparison: Two-view chest x-ray 04/20/2017

CLINICAL DATA: Dyspnea.  Dry cough following heart surgery.

EXAM:
CHEST  2 VIEW

[chest lat]
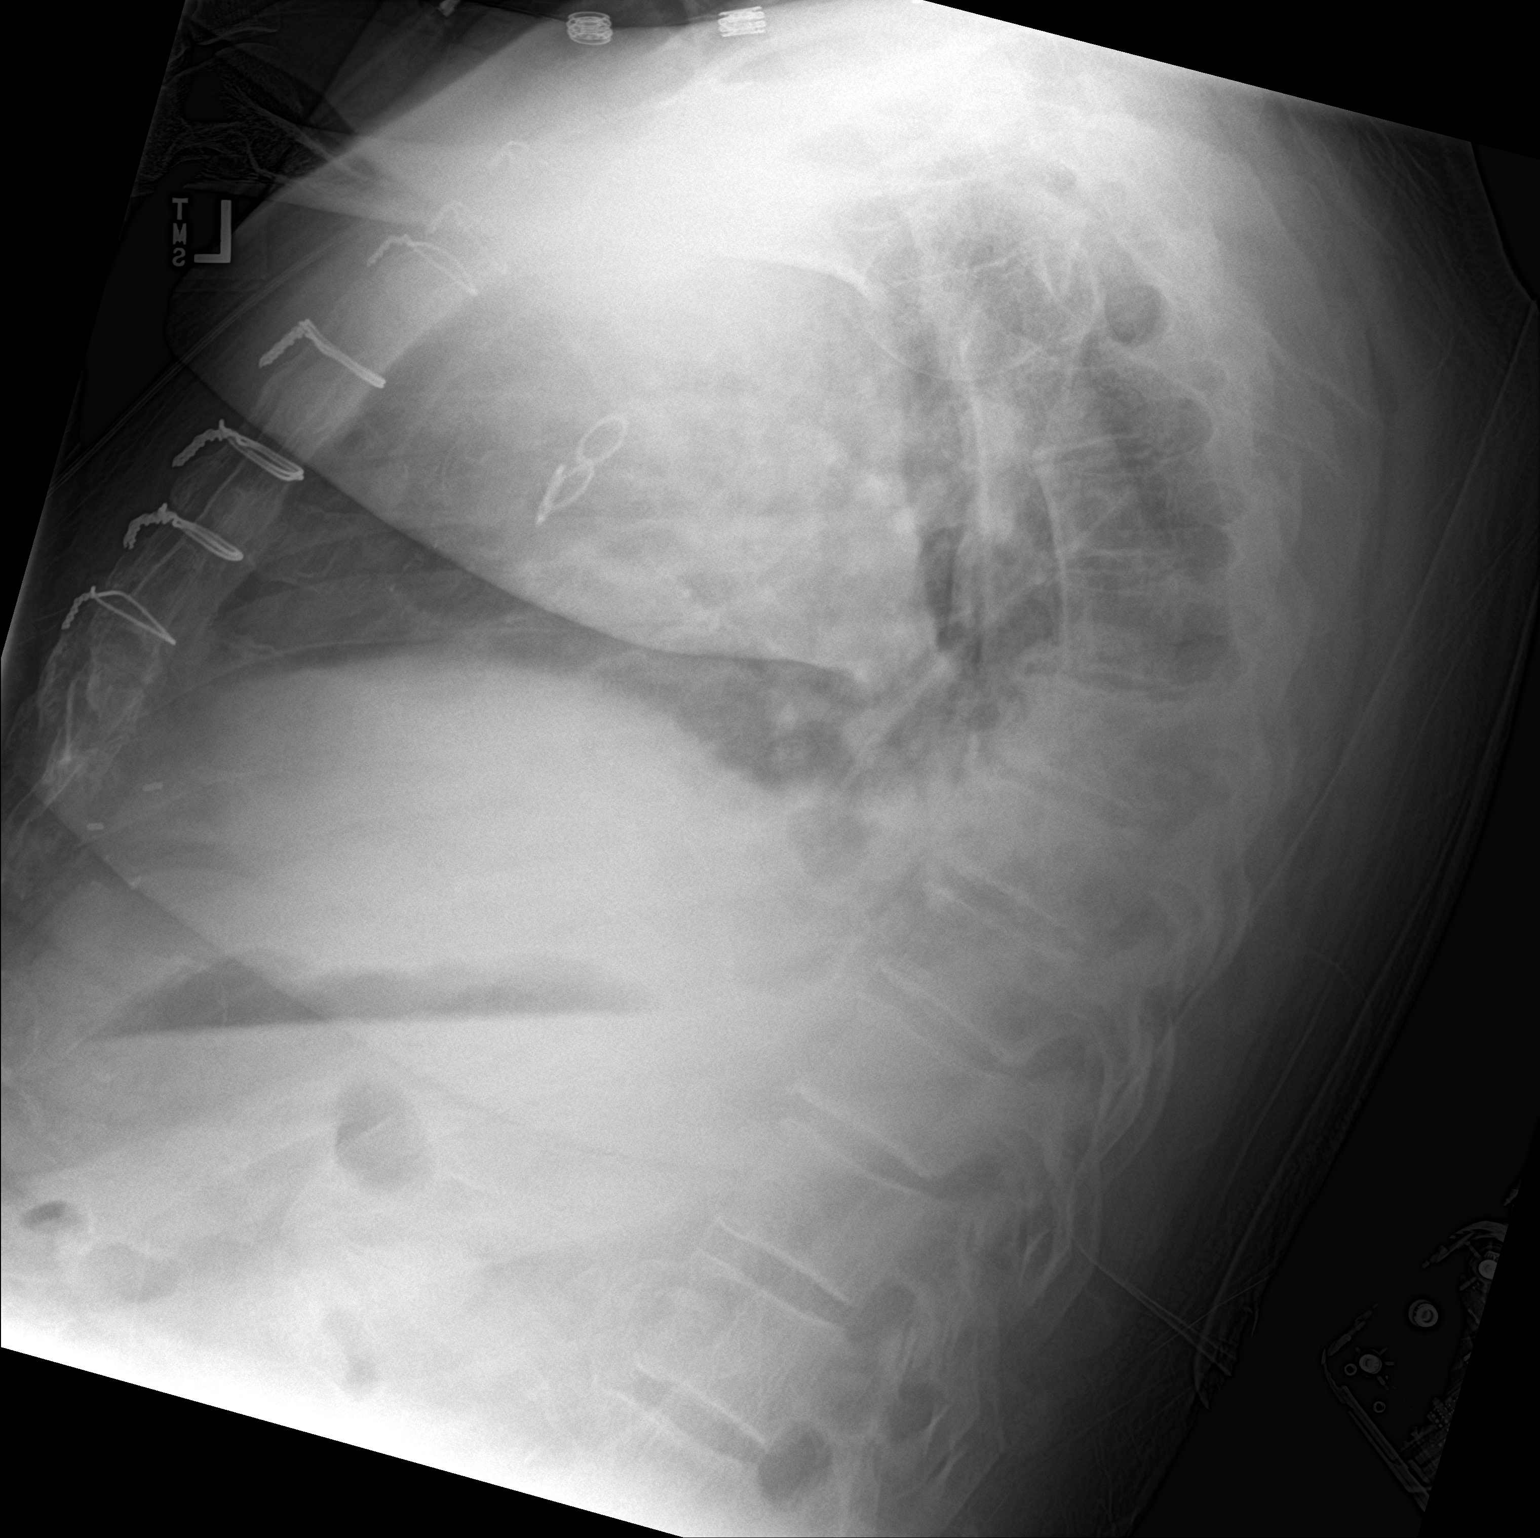

[chest ap]
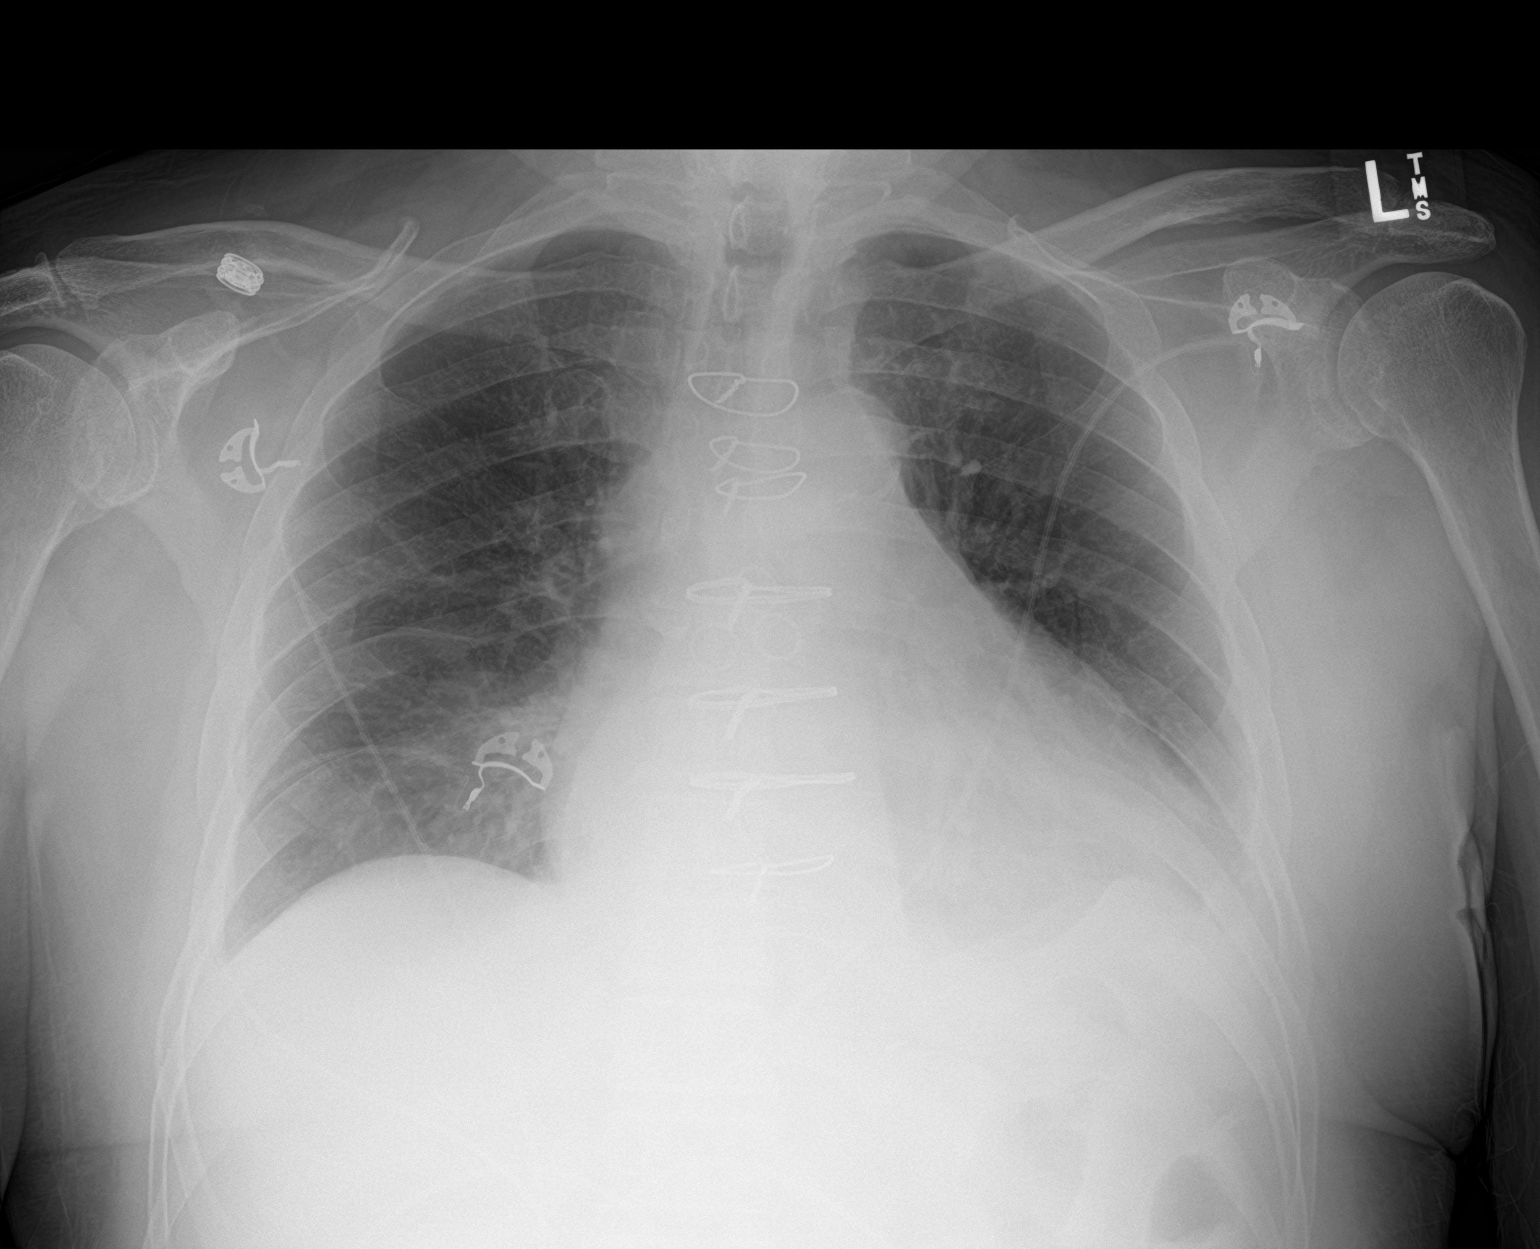

[2 of 2 positions shown; findings below may reference images not displayed]

FINDINGS: Cardiomegaly is stable. Patient is status post median sternotomy for
CABG. Aeration of both lungs continues to improve. Left greater than
right pleural effusion and basilar airspace disease remain.
Atherosclerotic changes are noted at the aortic arch.
IMPRESSION: 1. Stable cardiomegaly with improving aeration of both lungs.
2. Persistent left pleural effusion and associated airspace disease.

## 2019-04-22 ENCOUNTER — Telehealth: Payer: Self-pay | Admitting: *Deleted

## 2019-04-22 NOTE — Telephone Encounter (Signed)
Schedule awv  

## 2019-08-12 ENCOUNTER — Other Ambulatory Visit (HOSPITAL_COMMUNITY): Payer: Medicare HMO

## 2019-08-14 ENCOUNTER — Encounter: Payer: Self-pay | Admitting: Cardiovascular Disease

## 2019-08-14 ENCOUNTER — Other Ambulatory Visit: Payer: Self-pay

## 2019-08-14 ENCOUNTER — Telehealth: Payer: Self-pay | Admitting: Cardiovascular Disease

## 2019-08-14 ENCOUNTER — Ambulatory Visit: Payer: Medicare HMO | Admitting: Cardiovascular Disease

## 2019-08-14 VITALS — BP 160/90 | HR 70 | Ht 66.0 in | Wt 180.2 lb

## 2019-08-14 DIAGNOSIS — I2510 Atherosclerotic heart disease of native coronary artery without angina pectoris: Secondary | ICD-10-CM

## 2019-08-14 DIAGNOSIS — E782 Mixed hyperlipidemia: Secondary | ICD-10-CM

## 2019-08-14 DIAGNOSIS — I5032 Chronic diastolic (congestive) heart failure: Secondary | ICD-10-CM

## 2019-08-14 DIAGNOSIS — I10 Essential (primary) hypertension: Secondary | ICD-10-CM

## 2019-08-14 LAB — CBC WITH DIFFERENTIAL/PLATELET
Basophils Absolute: 0.1 10*3/uL (ref 0.0–0.2)
Basos: 1 %
EOS (ABSOLUTE): 0.3 10*3/uL (ref 0.0–0.4)
Eos: 4 %
Hematocrit: 42.7 % (ref 37.5–51.0)
Hemoglobin: 13.8 g/dL (ref 13.0–17.7)
Immature Grans (Abs): 0 10*3/uL (ref 0.0–0.1)
Immature Granulocytes: 0 %
Lymphocytes Absolute: 1.4 10*3/uL (ref 0.7–3.1)
Lymphs: 17 %
MCH: 28.8 pg (ref 26.6–33.0)
MCHC: 32.3 g/dL (ref 31.5–35.7)
MCV: 89 fL (ref 79–97)
Monocytes Absolute: 0.6 10*3/uL (ref 0.1–0.9)
Monocytes: 7 %
Neutrophils Absolute: 5.7 10*3/uL (ref 1.4–7.0)
Neutrophils: 71 %
Platelets: 238 10*3/uL (ref 150–450)
RBC: 4.79 x10E6/uL (ref 4.14–5.80)
RDW: 14 % (ref 11.6–15.4)
WBC: 8.1 10*3/uL (ref 3.4–10.8)

## 2019-08-14 LAB — COMPREHENSIVE METABOLIC PANEL
ALT: 9 IU/L (ref 0–44)
AST: 12 IU/L (ref 0–40)
Albumin/Globulin Ratio: 1.1 — ABNORMAL LOW (ref 1.2–2.2)
Albumin: 3.8 g/dL (ref 3.7–4.7)
Alkaline Phosphatase: 90 IU/L (ref 48–121)
BUN/Creatinine Ratio: 9 — ABNORMAL LOW (ref 10–24)
BUN: 9 mg/dL (ref 8–27)
Bilirubin Total: 0.5 mg/dL (ref 0.0–1.2)
CO2: 24 mmol/L (ref 20–29)
Calcium: 9.3 mg/dL (ref 8.6–10.2)
Chloride: 101 mmol/L (ref 96–106)
Creatinine, Ser: 0.99 mg/dL (ref 0.76–1.27)
GFR calc Af Amer: 85 mL/min/{1.73_m2} (ref >59)
GFR calc non Af Amer: 73 mL/min/{1.73_m2} (ref >59)
Globulin, Total: 3.4 g/dL (ref 1.5–4.5)
Glucose: 108 mg/dL — ABNORMAL HIGH (ref 65–99)
Potassium: 4 mmol/L (ref 3.5–5.2)
Sodium: 138 mmol/L (ref 134–144)
Total Protein: 7.2 g/dL (ref 6.0–8.5)

## 2019-08-14 LAB — LIPID PANEL
Chol/HDL Ratio: 2.8 ratio (ref 0.0–5.0)
Cholesterol, Total: 105 mg/dL (ref 100–199)
HDL: 37 mg/dL — ABNORMAL LOW (ref >39)
LDL Chol Calc (NIH): 50 mg/dL (ref 0–99)
Triglycerides: 96 mg/dL (ref 0–149)
VLDL Cholesterol Cal: 18 mg/dL (ref 5–40)

## 2019-08-14 MED ORDER — VALSARTAN 320 MG PO TABS: 320.0000 mg | ORAL_TABLET | Freq: Every day | ORAL | 3 refills | Status: DC

## 2019-08-14 NOTE — Telephone Encounter (Signed)
The patient and his wife are confused about his medications. Reviewed all cardiac medications and confirmed they have available pills (they have their pill bottles with them).  Confirmed the only medication change made today was to increase Diovan to 320 mg daily. They understand to take 2 tablets until they get the new prescription in the mail and then he will take 1 tablet. They were grateful for assistance.

## 2019-08-14 NOTE — Progress Notes (Signed)
Cardiology Office Note:    Date:  08/14/2019   ID:  [name], DOB [birth year], MRN [id number]  PCP:  Horald Pollen, [place] Cardiologist:  Sherren Mocha, MD  [place] Electrophysiologist:  None   Referring MD: Horald Pollen, *   Chief Complaint  Patient presents with  . Coronary Artery Disease    History of Present Illness:    [name] is a 79 y.o. male with a hx of coronary artery disease presenting for follow-up evaluation.  The patient underwent PCI in 2008 after presenting with symptoms of unstable angina.  The left circumflex and obtuse marginal branches were treated with bare-metal stenting.  He developed angina again in 2019 despite medical therapy and was referred for cardiac catheterization demonstrating multivessel coronary artery disease.  He was ultimately treated with CABG.  He had a postoperative pericardial effusion and slowly resolved over time.  He has had longstanding hypertension.  The patient is here with his wife today. He is doing quite well. He complains of some swelling in his right leg where he has had saphenous vein graft harvest in the past. He has no edema in his left leg. He otherwise is doing very well. Today, he denies symptoms of palpitations, chest pain, shortness of breath, orthopnea, PND, dizziness, or syncope.    Past Medical History:  Diagnosis Date  . Blurred vision 05/17/2016  . CAD (coronary artery disease), native coronary artery 06/14/2008   a. BMS to LCx & OM 2008 b. 03/2017 CABG x 4 (LIMA to LAD, SVG to diag 1, SVG to distal Circ, SVG to PDA)  . Chest pain 04/16/2012  . Diplopia   . Echocardiogram    Echo 10/19: mod LVH, EF 55-60, no RWMA, Gr 1 DD, trivial AI, MAC, mod LAE, prob small post effusion  . Essential hypertension 05/17/2016  . Hyperlipidemia with target low density lipoprotein (LDL) cholesterol less than 70 mg/dL 06/14/2008   Qualifier: Diagnosis of  By: Mare Ferrari, RMA, [name]    .  Hypertension   . Hypertensive heart disease 06/14/2008   Qualifier: Diagnosis of  By: Mare Ferrari, [place], [name]    . Hypertensive urgency, malignant 04/16/2012  . Hypokalemia 04/14/2017  . Internal hemorrhoids 04/19/2012  . Ischemic stroke ([facility]) 05/02/2016  . Non-ST elevation (NSTEMI) myocardial infarction ([place] [facility])   . pandiverticulosis 04/22/2012   12/17/2011. [place]. Juanita Craver MD. Colonoscopy. Moderate sized internal hemorrhoids and extensive pandiverticulosis. Repeat 5 years.    . Pericardial effusion    Echo 11/19: mild focal basal septal hypertrophy, EF 55-60, Gr 2 DD, trivial AI, trivial TR, trivial PI, small to mod eff post to heart - no evidence of RV collapse.>> repeat limited echo in 03/2018 // Echo 03/2018: EF 55-60, mild LVH, +diastolic dysfunction, no RWMA, PASP 23, trivial pericardial effusion   . Polycythemia vera(238.4) 04/17/2012  . S/P CABG x 4 04/17/2017  . Syncope and collapse 04/17/2012   Pt syncopized while sitting in bed giving history @ time of admission to hospital Tachycardic (appeared sinus) to 120s-130s and hypotensive to 50s/30s.  Unresponsive initially >> Spontaneously resolved after 2-3 minutes >> return to baseline ~30 minutes   . Vertigo     Past Surgical History:  Procedure Laterality Date  . CORONARY ARTERY BYPASS GRAFT N/A 04/17/2017   Procedure: CORONARY ARTERY BYPASS GRAFTING (CABG) x4 , using left internal mammary artery  to LAD and right leg greater saphenous vein harvested endoscopically  to PDA, Diagonal I and Circumflex;  Surgeon:  [name], MD;  Location: [facility];  Service: Open Heart Surgery;  Laterality: N/A;  . CORONARY STENT PLACEMENT    . LEFT HEART CATH AND CORONARY ANGIOGRAPHY N/A 04/16/2017   Procedure: LEFT HEART CATH AND CORONARY ANGIOGRAPHY;  Surgeon: Sherren Mocha, MD;  Location: [facility];  Service: Cardiovascular;  Laterality: N/A;  . TEE WITHOUT CARDIOVERSION N/A 04/17/2017   Procedure: TRANSESOPHAGEAL ECHOCARDIOGRAM  (TEE);  Surgeon: [name], MD;  Location: [facility];  Service: Open Heart Surgery;  Laterality: N/A;    Current Medications: Current Meds  Medication Sig  . acetaminophen (TYLENOL) 325 MG tablet Take 325 mg by mouth daily.  Marland Kitchen ALPRAZolam (XANAX) 0.25 MG tablet Take 1 tablet (0.25 mg total) by mouth 2 (two) times daily as needed for anxiety.  Marland Kitchen aspirin EC 81 MG tablet Take 81 mg by mouth daily.  . carvedilol (COREG) 12.5 MG tablet Take 1 tablet (12.5 mg total) by mouth 2 (two) times daily.  . clopidogrel (PLAVIX) 75 MG tablet Take 1 tablet (75 mg total) by mouth daily.  . furosemide (LASIX) 40 MG tablet Take 1 tablet (40 mg total) by mouth every other day.  . ibuprofen (ADVIL) 800 MG tablet Take 800 mg by mouth 3 (three) times daily.  . potassium chloride SA (KLOR-CON) 20 MEQ tablet Take 1 tablet (20 mEq total) by mouth every other day.  . rosuvastatin (CRESTOR) 40 MG tablet Take 1 tablet (40 mg total) by mouth daily. Dose change  . valsartan (DIOVAN) 160 MG tablet Take 1 tablet (160 mg total) by mouth daily. Dose change     Allergies:   Lipitor [atorvastatin], Morphine and related, and Pork-derived products   Social History   Socioeconomic History  . Marital status: Married    Spouse name: Not on file  . Number of children: Not on file  . Years of education: Not on file  . Highest education level: Not on file  Occupational History  . Not on file  Tobacco Use  . Smoking status: Never Smoker  . Smokeless tobacco: Never Used  Vaping Use  . Vaping Use: Never used  Substance and Sexual Activity  . Alcohol use: No    Alcohol/week: 0.0 standard drinks  . Drug use: No  . Sexual activity: Not on file  Other Topics Concern  . Not on file  Social History Narrative   Lives in [place] with Wife and 2 sons.  From [place].  To [place] ~2000.     Currently retired but worked in [place] Strain:   . Difficulty of  Paying Living Expenses:   Food Insecurity:   . Worried About Charity fundraiser in the Last Year:   . Arboriculturist in the Last Year:   Transportation Needs:   . Film/video editor (Medical):   Marland Kitchen Lack of Transportation (Non-Medical):   Physical Activity:   . Days of Exercise per Week:   . Minutes of Exercise per Session:   Stress:   . Feeling of Stress :   Social Connections:   . Frequency of Communication with Friends and Family:   . Frequency of Social Gatherings with Friends and Family:   . Attends Religious Services:   . Active Member of Clubs or Organizations:   . Attends Archivist Meetings:   Marland Kitchen Marital Status:      Family History: The patient's family history includes Cancer  in his sister; Heart disease in his mother.  ROS:   Please see the history of present illness.    All other systems reviewed and are negative.  EKGs/Labs/Other Studies Reviewed:    The following studies were reviewed today: Echo 04/09/2018: IMPRESSIONS    1. The left ventricle has normal systolic function, with an ejection  fraction of 55-60%. The cavity size was normal. There is mildly increased  left ventricular wall thickness. Left ventricular diastolic Doppler  parameters are consistent with impaired  relaxation No evidence of left ventricular regional wall motion  abnormalities.  2. The right ventricle has normal systolc function. The cavity was  normal. There is no increase in right ventricular wall thickness.  3. Trivial pericardial effusion is present.  4. The mitral valve is normal in structure. No evidence of mitral valve  stenosis. No significant mitral regurgitation.  5. The tricuspid valve was normal in structure.  6. The pulmonic valve was normal in structure.  7. The ascending aorta is normal in size and structure.  8. There is mild dilatation of the aortic root measuring 40 mm.  9. IVC normal. PA systolic pressure 23 mmHg.   EKG:  EKG is not  ordered today.   Recent Labs: No results found for requested labs within last 8760 hours.  Recent Lipid Panel    Component Value Date/Time   CHOL 101 05/29/2017 1013   TRIG 84 05/29/2017 1013   HDL 35 (L) 05/29/2017 1013   CHOLHDL 2.9 05/29/2017 1013   CHOLHDL 4.5 04/15/2017 0241   VLDL 9 04/15/2017 0241   LDLCALC 49 05/29/2017 1013   LDLDIRECT 150.8 06/30/2008 1007    Physical Exam:    VS:  BP (!) 160/90   Pulse 70   Ht _0  (1.676 m)   Wt 180 lb 3.2 oz (81.7 kg)   SpO2 98%   BMI 29.09 kg/m     Wt Readings from Last 3 Encounters:  08/14/19 180 lb 3.2 oz (81.7 kg)  02/05/19 183 lb (83 kg)  08/06/18 189 lb 12.8 oz (86.1 kg)     GEN:  Well nourished, well developed in no acute distress HEENT: Normal NECK: No JVD; No carotid bruits LYMPHATICS: No lymphadenopathy CARDIAC: RRR, no murmurs, rubs, gallops RESPIRATORY:  Clear to auscultation without rales, wheezing or rhonchi  ABDOMEN: Soft, non-tender, non-distended MUSCULOSKELETAL: Trace edema right leg; No deformity  SKIN: Warm and dry NEUROLOGIC:  Alert and oriented x 3 PSYCHIATRIC:  Normal affect   ASSESSMENT:    1. Coronary artery disease involving native coronary artery of native heart without angina pectoris   2. Chronic diastolic heart failure ([place])   3. Essential hypertension   4. Mixed hyperlipidemia    PLAN:    In order of problems listed above:  1. Appears stable.  Continue clopidogrel for antiplatelet therapy.  Continue statin drug.  Appears to be having no angina at present.  I will see him back in 6 months. 2. Looks euvolemic on exam without dyspnea, orthopnea, or PND.  Update 2D echocardiogram.  Follow-up 6 months. 3. Suboptimal blood pressure control.  Recommend increase valsartan to 320 mg daily.  Continue carvedilol 12.5 mg twice daily.  Sodium restriction and medication compliance discussed. 4. Check fasting labs today.  Patient treated with rosuvastatin 40 mg daily.  Medication  Adjustments/Labs and Tests Ordered: Current medicines are reviewed at length with the patient today.  Concerns regarding medicines are outlined above.  No orders of the defined types were placed  in this encounter.  No orders of the defined types were placed in this encounter.   There are no Patient Instructions on file for this visit.   Signed, Sherren Mocha, MD  08/14/2019 9:39 AM    [place]

## 2019-08-14 NOTE — Telephone Encounter (Signed)
Patient calling stating he had an appointment today and was told to call when he got home.

## 2019-08-14 NOTE — Patient Instructions (Signed)
Medication Instructions:  1) INCREASE DIOVAN (valsartan) to 320 mg daily *If you need a refill on your cardiac medications before your next appointment, please call your pharmacy*  Lab Work: TODAY: CMET, lipids, CBC If you have labs (blood work) drawn today and your tests are completely normal, you will receive your results only by: Marland Kitchen MyChart Message (if you have MyChart) OR . A paper copy in the mail If you have any lab test that is abnormal or we need to change your treatment, we will call you to review the results.  Testing/Procedures: Your provider has requested that you have an echocardiogram. Echocardiography is a painless test that uses sound waves to create images of your heart. It provides your doctor with information about the size and shape of your heart and how well your heart's chambers and valves are working. This procedure takes approximately one hour. There are no restrictions for this procedure.  Follow-Up: At [facility], you and your health needs are our priority.  As part of our continuing mission to provide you with exceptional heart care, we have created designated Provider Care Teams.  These Care Teams include your primary Cardiologist (physician) and Advanced Practice Providers (APPs -  Physician Assistants and Nurse Practitioners) who all work together to provide you with the care you need, when you need it. Your next appointment:   6 month(s) The format for your next appointment:   In Person Provider:   You may see Sherren Mocha, MD or one of the following Advanced Practice Providers on your designated Care Team:    Richardson Dopp, PA-C  [place], [place]

## 2019-08-26 ENCOUNTER — Other Ambulatory Visit: Payer: Self-pay

## 2019-08-26 ENCOUNTER — Ambulatory Visit (HOSPITAL_COMMUNITY): Payer: Medicare HMO | Attending: Cardiology

## 2019-08-26 DIAGNOSIS — I2510 Atherosclerotic heart disease of native coronary artery without angina pectoris: Secondary | ICD-10-CM | POA: Diagnosis not present

## 2019-08-26 DIAGNOSIS — I5032 Chronic diastolic (congestive) heart failure: Secondary | ICD-10-CM | POA: Diagnosis not present

## 2019-11-25 ENCOUNTER — Other Ambulatory Visit: Payer: Self-pay

## 2019-11-25 ENCOUNTER — Ambulatory Visit: Payer: Medicare HMO | Admitting: Physician Assistant

## 2019-11-25 ENCOUNTER — Encounter: Payer: Self-pay | Admitting: Physician Assistant

## 2019-11-25 VITALS — BP 150/90 | HR 70 | Ht 66.0 in | Wt 180.2 lb

## 2019-11-25 DIAGNOSIS — I10 Essential (primary) hypertension: Secondary | ICD-10-CM | POA: Diagnosis not present

## 2019-11-25 DIAGNOSIS — E782 Mixed hyperlipidemia: Secondary | ICD-10-CM | POA: Diagnosis not present

## 2019-11-25 DIAGNOSIS — I5032 Chronic diastolic (congestive) heart failure: Secondary | ICD-10-CM | POA: Diagnosis not present

## 2019-11-25 DIAGNOSIS — I2510 Atherosclerotic heart disease of native coronary artery without angina pectoris: Secondary | ICD-10-CM | POA: Diagnosis not present

## 2019-11-25 MED ORDER — CARVEDILOL 12.5 MG PO TABS: 12.5000 mg | ORAL_TABLET | Freq: Two times a day (BID) | ORAL | 3 refills | Status: DC

## 2019-11-25 MED ORDER — ROSUVASTATIN CALCIUM 40 MG PO TABS: 40.0000 mg | ORAL_TABLET | Freq: Every day | ORAL | 3 refills | Status: DC

## 2019-11-25 MED ORDER — POTASSIUM CHLORIDE CRYS ER 20 MEQ PO TBCR: 20.0000 meq | EXTENDED_RELEASE_TABLET | ORAL | 0 refills | Status: DC

## 2019-11-25 MED ORDER — FUROSEMIDE 40 MG PO TABS: 40.0000 mg | ORAL_TABLET | ORAL | 3 refills | Status: DC

## 2019-11-25 MED ORDER — ROSUVASTATIN CALCIUM 40 MG PO TABS: 40.0000 mg | ORAL_TABLET | Freq: Every day | ORAL | 0 refills | Status: DC

## 2019-11-25 MED ORDER — VALSARTAN 320 MG PO TABS: 320.0000 mg | ORAL_TABLET | Freq: Every day | ORAL | 0 refills | Status: DC

## 2019-11-25 MED ORDER — VALSARTAN 320 MG PO TABS: 320.0000 mg | ORAL_TABLET | Freq: Every day | ORAL | 3 refills | Status: DC

## 2019-11-25 MED ORDER — CARVEDILOL 12.5 MG PO TABS: 12.5000 mg | ORAL_TABLET | Freq: Two times a day (BID) | ORAL | 0 refills | Status: DC

## 2019-11-25 MED ORDER — POTASSIUM CHLORIDE CRYS ER 20 MEQ PO TBCR: 20.0000 meq | EXTENDED_RELEASE_TABLET | ORAL | 3 refills | Status: DC

## 2019-11-25 MED ORDER — CLOPIDOGREL BISULFATE 75 MG PO TABS: 75.0000 mg | ORAL_TABLET | Freq: Every day | ORAL | 0 refills | Status: DC

## 2019-11-25 MED ORDER — CLOPIDOGREL BISULFATE 75 MG PO TABS: 75.0000 mg | ORAL_TABLET | Freq: Every day | ORAL | 3 refills | Status: DC

## 2019-11-25 NOTE — Patient Instructions (Addendum)
Medication Instructions:  Your physician recommends that you continue on your current medications as directed. Please refer to the Current Medication list given to you today.  I have sent your medication refills to [facility].  They will mail them out to you.  Valsartan = Blood Pressure Potassium = Potassium replacement Fursoemide = Fluid (water) Pill Clopidogrel = Blood Thinner Carvedilol = Heart medicine Rosuvastatin = Cholesterol   *If you need a refill on your cardiac medications before your next appointment, please call your pharmacy*   Lab Work: None ordered  If you have labs (blood work) drawn today and your tests are completely normal, you will receive your results only by: Marland Kitchen MyChart Message (if you have MyChart) OR . A paper copy in the mail If you have any lab test that is abnormal or we need to change your treatment, we will call you to review the results.   Testing/Procedures: None ordered   Follow-Up: At [facility], you and your health needs are our priority.  As part of our continuing mission to provide you with exceptional heart care, we have created designated Provider Care Teams.  These Care Teams include your primary Cardiologist (physician) and Advanced Practice Providers (APPs -  Physician Assistants and Nurse Practitioners) who all work together to provide you with the care you need, when you need it.  We recommend signing up for the patient portal called "MyChart".  Sign up information is provided on this After Visit Summary.  MyChart is used to connect with patients for Virtual Visits (Telemedicine).  Patients are able to view lab/test results, encounter notes, upcoming appointments, etc.  Non-urgent messages can be sent to your provider as well.   To learn more about what you can do with MyChart, go to [email or web address].    Your next appointment:   6 month(s)  The format for your next appointment:   In Person  Provider:   You may see  Sherren Mocha, MD or one of the following Advanced Practice Providers on your designated Care Team:    Richardson Dopp, PA-C  Robbie Lis, [place]    Other Instructions

## 2019-11-25 NOTE — Progress Notes (Signed)
Cardiology Office Note    Date:  11/25/2019   ID:  [name], DOB 08-28-1940, MRN [id number]  PCP:  Horald Pollen, MD  Cardiologist: Dr. Burt Knack  Chief Complaint: 4 Months follow up  History of Present Illness:   [name] is a 79 y.o. male with hx of CAD, HTN, HLD, chronic diastolic CHF and stroke seen for follow up.   Hx of CAD s/p BMS to left circumflex and obtuse marginal branches in 2008.  He ultimately treated with CABG in 2019. He had a postoperative pericardial effusion and slowly resolved over time.  Last echo 08/26/19 showed LVEF of 60-65% and grade II DD.    Patient is here for follow-up.  He is not taking his Plavix, Coreg and valsartan for at least 1 week.  He states he ran out of these medications because [employer] never sent prescription drugs.  He does push mowing lawn and has no symptoms.  He has intermittent indigestion which improves with drinking milk.  Symptoms not similar to prior angina.  He denies chest pain, shortness of breath, orthopnea, PND, syncope or lower extremity edema.  We have sent his prescription to local pharmacy and wrote down indications/use of each drug.  Past Medical History:  Diagnosis Date  . Blurred vision 05/17/2016  . CAD (coronary artery disease), native coronary artery 06/14/2008   a. BMS to LCx & OM 2008 b. 03/2017 CABG x 4 (LIMA to LAD, SVG to diag 1, SVG to distal Circ, SVG to PDA)  . Chest pain 04/16/2012  . Diplopia   . Echocardiogram    Echo 10/19: mod LVH, EF 55-60, no RWMA, Gr 1 DD, trivial AI, MAC, mod LAE, prob small post effusion  . Essential hypertension 05/17/2016  . Hyperlipidemia with target low density lipoprotein (LDL) cholesterol less than 70 mg/dL 06/14/2008   Qualifier: Diagnosis of  By: Mare Ferrari, RMA, [name]    . Hypertension   . Hypertensive heart disease 06/14/2008   Qualifier: Diagnosis of  By: Mare Ferrari, [place], [name]    . Hypertensive urgency, malignant 04/16/2012  . Hypokalemia 04/14/2017  .  Internal hemorrhoids 04/19/2012  . Ischemic stroke ([place]) 05/02/2016  . Non-ST elevation (NSTEMI) myocardial infarction ([place])   . pandiverticulosis 04/22/2012   12/17/2011. [facility]. Juanita Craver MD. Colonoscopy. Moderate sized internal hemorrhoids and extensive pandiverticulosis. Repeat 5 years.    . Pericardial effusion    Echo 11/19: mild focal basal septal hypertrophy, EF 55-60, Gr 2 DD, trivial AI, trivial TR, trivial PI, small to mod eff post to heart - no evidence of RV collapse.>> repeat limited echo in 03/2018 // Echo 03/2018: EF 55-60, mild LVH, +diastolic dysfunction, no RWMA, PASP 23, trivial pericardial effusion   . Polycythemia vera(238.4) 04/17/2012  . S/P CABG x 4 04/17/2017  . Syncope and collapse 04/17/2012   Pt syncopized while sitting in bed giving history @ time of admission to hospital Tachycardic (appeared sinus) to 120s-130s and hypotensive to 50s/30s.  Unresponsive initially >> Spontaneously resolved after 2-3 minutes >> return to baseline ~30 minutes   . Vertigo     Past Surgical History:  Procedure Laterality Date  . CORONARY ARTERY BYPASS GRAFT N/A 04/17/2017   Procedure: CORONARY ARTERY BYPASS GRAFTING (CABG) x4 , using left internal mammary artery  to LAD and right leg greater saphenous vein harvested endoscopically  to PDA, Diagonal I and Circumflex;  Surgeon: [name], MD;  Location: [place];  Service: Open Heart Surgery;  Laterality: N/A;  .  CORONARY STENT PLACEMENT    . LEFT HEART CATH AND CORONARY ANGIOGRAPHY N/A 04/16/2017   Procedure: LEFT HEART CATH AND CORONARY ANGIOGRAPHY;  Surgeon: Sherren Mocha, MD;  Location: [place] [facility];  Service: Cardiovascular;  Laterality: N/A;  . TEE WITHOUT CARDIOVERSION N/A 04/17/2017   Procedure: TRANSESOPHAGEAL ECHOCARDIOGRAM (TEE);  Surgeon: [name], MD;  Location: [place];  Service: Open Heart Surgery;  Laterality: N/A;    Current Medications:  Prior to Admission medications   Medication Sig  Start Date End Date Taking? Authorizing Provider  acetaminophen (TYLENOL) 325 MG tablet Take 325 mg by mouth daily.   Yes [provider]  aspirin EC 81 MG tablet Take 81 mg by mouth daily.   Yes [provider]  carvedilol (COREG) 12.5 MG tablet Take 1 tablet (12.5 mg total) by mouth 2 (two) times daily. 08/06/18  Yes Sherren Mocha, MD  clopidogrel (PLAVIX) 75 MG tablet Take 1 tablet (75 mg total) by mouth daily. 08/06/18  Yes Sherren Mocha, MD  furosemide (LASIX) 40 MG tablet Take 1 tablet (40 mg total) by mouth every other day. 02/05/19 01/31/20 Yes Sherren Mocha, MD  potassium chloride SA (KLOR-CON) 20 MEQ tablet Take 1 tablet (20 mEq total) by mouth every other day. 02/05/19 01/31/20 Yes Sherren Mocha, MD  rosuvastatin (CRESTOR) 40 MG tablet Take 1 tablet (40 mg total) by mouth daily. Dose change 08/06/18  Yes Burt Knack, Legrand Como, MD  valsartan (DIOVAN) 320 MG tablet Take 1 tablet (320 mg total) by mouth daily. 08/14/19 08/08/20 Yes Sherren Mocha, MD    Allergies:   Lipitor [atorvastatin], Morphine and related, and Pork-derived products   Social History   Socioeconomic History  . Marital status: Married    Spouse name: Not on file  . Number of children: Not on file  . Years of education: Not on file  . Highest education level: Not on file  Occupational History  . Not on file  Tobacco Use  . Smoking status: Never Smoker  . Smokeless tobacco: Never Used  Vaping Use  . Vaping Use: Never used  Substance and Sexual Activity  . Alcohol use: No    Alcohol/week: 0.0 standard drinks  . Drug use: No  . Sexual activity: Not on file  Other Topics Concern  . Not on file  Social History Narrative   Lives in [place] with Wife and 2 sons.  From [place].  To [place] ~2000.     Currently retired but worked in Veterinary surgeon   Social Determinants of Radio broadcast assistant Strain:   . Difficulty of Paying Living Expenses: Not on file  Food Insecurity:   .  Worried About Charity fundraiser in the Last Year: Not on file  . Ran Out of Food in the Last Year: Not on file  Transportation Needs:   . Lack of Transportation (Medical): Not on file  . Lack of Transportation (Non-Medical): Not on file  Physical Activity:   . Days of Exercise per Week: Not on file  . Minutes of Exercise per Session: Not on file  Stress:   . Feeling of Stress : Not on file  Social Connections:   . Frequency of Communication with Friends and Family: Not on file  . Frequency of Social Gatherings with Friends and Family: Not on file  . Attends Religious Services: Not on file  . Active Member of Clubs or Organizations: Not on file  . Attends Archivist Meetings: Not on file  .  Marital Status: Not on file     Family History:  The patient's family history includes Cancer in his sister; Heart disease in his mother.   ROS:   Please see the history of present illness.    ROS All other systems reviewed and are negative.   PHYSICAL EXAM:   VS:  BP (!) 150/90   Pulse 70   Ht _0  (1.676 m)   Wt 180 lb 3.2 oz (81.7 kg)   SpO2 96%   BMI 29.09 kg/m    GEN: Well nourished, well developed, in no acute distress  HEENT: normal  Neck: no JVD, carotid bruits, or masses Cardiac: RRR; no murmurs, rubs, or gallops,no edema  Respiratory:  clear to auscultation bilaterally, normal work of breathing GI: soft, nontender, nondistended, + BS MS: no deformity or atrophy  Skin: warm and dry, no rash Neuro:  Alert and Oriented x 3, Strength and sensation are intact Psych: euthymic mood, full affect  Wt Readings from Last 3 Encounters:  11/25/19 180 lb 3.2 oz (81.7 kg)  08/14/19 180 lb 3.2 oz (81.7 kg)  02/05/19 183 lb (83 kg)      Studies/Labs Reviewed:   EKG:  EKG is not ordered today.    Recent Labs: 08/14/2019: ALT 9; BUN 9; Creatinine, Ser 0.99; Hemoglobin 13.8; Platelets 238; Potassium 4.0; Sodium 138   Lipid Panel    Component Value Date/Time   CHOL 105  08/14/2019 0956   TRIG 96 08/14/2019 0956   HDL 37 (L) 08/14/2019 0956   CHOLHDL 2.8 08/14/2019 0956   CHOLHDL 4.5 04/15/2017 0241   VLDL 9 04/15/2017 0241   LDLCALC 50 08/14/2019 0956   LDLDIRECT 150.8 06/30/2008 1007    Additional studies/ records that were reviewed today include:   Echocardiogram: 08/26/19 1. Left ventricular ejection fraction, by estimation, is 60 to 65%. The  left ventricle has normal function. The left ventricle has no regional  wall motion abnormalities. Left ventricular diastolic parameters are  consistent with Grade II diastolic  dysfunction (pseudonormalization). Elevated left ventricular end-diastolic  pressure.  2. Right ventricular systolic function is normal. The right ventricular  size is normal. Tricuspid regurgitation signal is inadequate for assessing  PA pressure.  3. Left atrial size was mild to moderately dilated.  4. Right atrial size was mildly dilated.  5. The mitral valve is normal in structure. Trivial mitral valve  regurgitation. No evidence of mitral stenosis.  6. The aortic valve is tricuspid. Aortic valve regurgitation is trivial.  No aortic stenosis is present.  7. The inferior vena cava is normal in size with greater than 50%  respiratory variability, suggesting right atrial pressure of 3 mmHg.   ASSESSMENT & PLAN:    1. CAD s/p CABG - No angina.  Continue exercise regimen.  He has GERD symptoms which improves with drinking milk. -He ran out of his Plavix, Coreg and Diovan for at least 1 week.  Discussed importance and send prescription to local pharmacy.  No change in medical therapy.  2.  Hypertension -Blood pressure elevated as she is not out of antihypertensive regimens. -He will restart his medications for blood pressure.  3.  Hyperlipidemia -Continue Crestor  4.  Chronic diastolic heart failure -Continue Lasix and supplemental potassium   Medication Adjustments/Labs and Tests Ordered: Current medicines are  reviewed at length with the patient today.  Concerns regarding medicines are outlined above.  Medication changes, Labs and Tests ordered today are listed in the Patient Instructions below. Patient Instructions  Medication Instructions:  Your physician recommends that you continue on your current medications as directed. Please refer to the Current Medication list given to you today.  I have sent your medication refills to [facility].  They will mail them out to you.  Valsartan = Blood Pressure Potassium = Potassium replacement Fursoemide = Fluid (water) Pill Clopidogrel = Blood Thinner Carvedilol = Heart medicine Rosuvastatin = Cholesterol   *If you need a refill on your cardiac medications before your next appointment, please call your pharmacy*   Lab Work: None ordered  If you have labs (blood work) drawn today and your tests are completely normal, you will receive your results only by: Marland Kitchen MyChart Message (if you have MyChart) OR . A paper copy in the mail If you have any lab test that is abnormal or we need to change your treatment, we will call you to review the results.   Testing/Procedures: None ordered   Follow-Up: At [facility], you and your health needs are our priority.  As part of our continuing mission to provide you with exceptional heart care, we have created designated Provider Care Teams.  These Care Teams include your primary Cardiologist (physician) and Advanced Practice Providers (APPs -  Physician Assistants and Nurse Practitioners) who all work together to provide you with the care you need, when you need it.  We recommend signing up for the patient portal called "MyChart".  Sign up information is provided on this After Visit Summary.  MyChart is used to connect with patients for Virtual Visits (Telemedicine).  Patients are able to view lab/test results, encounter notes, upcoming appointments, etc.  Non-urgent messages can be sent to your provider as well.    To learn more about what you can do with MyChart, go to [email or web address].    Your next appointment:   6 month(s)  The format for your next appointment:   In Person  Provider:   You may see Sherren Mocha, MD or one of the following Advanced Practice Providers on your designated Care Team:    Richardson Dopp, PA-C  Robbie Lis, PA-C    Other Instructions      Jarrett Soho, [place]  11/25/2019 8:42 AM    [address], [place], [place]  [address] Phone: [phone]; Fax: [phone]

## 2019-12-25 ENCOUNTER — Other Ambulatory Visit: Payer: Self-pay | Admitting: Physician Assistant

## 2020-02-17 ENCOUNTER — Telehealth: Payer: Self-pay | Admitting: Emergency Medicine

## 2020-02-18 NOTE — Telephone Encounter (Signed)
When I called back pt stated never mind and he said he would come to next ov and discuss

## 2020-04-07 ENCOUNTER — Telehealth: Payer: Self-pay | Admitting: Cardiovascular Disease

## 2020-04-07 MED ORDER — VALSARTAN 320 MG PO TABS: 320.0000 mg | ORAL_TABLET | Freq: Every day | ORAL | 3 refills | Status: DC

## 2020-04-07 MED ORDER — ROSUVASTATIN CALCIUM 40 MG PO TABS: 40.0000 mg | ORAL_TABLET | Freq: Every day | ORAL | 3 refills | Status: DC

## 2020-04-07 MED ORDER — FUROSEMIDE 40 MG PO TABS: 40.0000 mg | ORAL_TABLET | ORAL | 3 refills | Status: DC

## 2020-04-07 MED ORDER — POTASSIUM CHLORIDE CRYS ER 20 MEQ PO TBCR: 20.0000 meq | EXTENDED_RELEASE_TABLET | ORAL | 3 refills | Status: DC

## 2020-04-07 MED ORDER — CLOPIDOGREL BISULFATE 75 MG PO TABS: 75.0000 mg | ORAL_TABLET | Freq: Every day | ORAL | 3 refills | Status: DC

## 2020-04-07 NOTE — Telephone Encounter (Signed)
Pt. Came in today with a bag full of medicine. He is requesting refills or a refill on his medication He has Valsartan, rosuvastatin,furosemide

## 2020-04-07 NOTE — Telephone Encounter (Signed)
Spoke with the patient's wife. Sent updated prescriptions to [facility] per request.

## 2020-05-25 ENCOUNTER — Ambulatory Visit: Payer: Medicare HMO | Admitting: Cardiovascular Disease

## 2020-05-25 ENCOUNTER — Encounter: Payer: Self-pay | Admitting: Cardiovascular Disease

## 2020-05-25 ENCOUNTER — Other Ambulatory Visit: Payer: Self-pay

## 2020-05-25 VITALS — BP 140/90 | HR 60 | Ht 66.0 in | Wt 176.0 lb

## 2020-05-25 DIAGNOSIS — R5383 Other fatigue: Secondary | ICD-10-CM | POA: Diagnosis not present

## 2020-05-25 DIAGNOSIS — I10 Essential (primary) hypertension: Secondary | ICD-10-CM | POA: Diagnosis not present

## 2020-05-25 DIAGNOSIS — I2510 Atherosclerotic heart disease of native coronary artery without angina pectoris: Secondary | ICD-10-CM | POA: Diagnosis not present

## 2020-05-25 DIAGNOSIS — R29898 Other symptoms and signs involving the musculoskeletal system: Secondary | ICD-10-CM

## 2020-05-25 DIAGNOSIS — E782 Mixed hyperlipidemia: Secondary | ICD-10-CM | POA: Diagnosis not present

## 2020-05-25 NOTE — Progress Notes (Signed)
Cardiology Office Note:    Date:  05/25/2020   ID:  [name], DOB March 20, 1940, MRN [id number]  PCP:  Horald Pollen, [place]  Cardiologist:  Sherren Mocha, MD  Advanced Practice Provider:  No care team member to display Electrophysiologist:  None       Referring MD: Horald Pollen, *   Chief Complaint  Patient presents with  . Weakness    History of Present Illness:    [name] is a 80 y.o. male with a hx of coronary artery disease presenting for follow-up evaluation.  The patient underwent PCI in 2008 after presenting with symptoms of unstable angina.  The left circumflex and obtuse marginal branches were treated with bare-metal stenting.  He developed angina again in 2019 despite medical therapy and was referred for cardiac catheterization demonstrating multivessel coronary artery disease.  He was ultimately treated with CABG.  He had a postoperative pericardial effusion and slowly resolved over time.  The patient has longstanding hypertension.  He is here with his wife today.  He has not been doing well lately.  He complains of progressive weakness, gait instability, and some dizziness.  States that when he bends forward he has fallen a few times.  He has both leg and arm weakness.  Sometimes after standing for a minute his legs began to shake.  He denies chest pain, shortness of breath, orthopnea, or PND.  He has checked his blood pressure to see if his symptoms are related to hypotension.  However, his blood pressure is generally running above goal.    Past Medical History:  Diagnosis Date  . Blurred vision 05/17/2016  . CAD (coronary artery disease), native coronary artery 06/14/2008   a. BMS to LCx & OM 2008 b. 03/2017 CABG x 4 (LIMA to LAD, SVG to diag 1, SVG to distal Circ, SVG to PDA)  . Chest pain 04/16/2012  . Diplopia   . Echocardiogram    Echo 10/19: mod LVH, EF 55-60, no RWMA, Gr 1 DD, trivial AI, MAC, mod  LAE, prob small post effusion  . Essential hypertension 05/17/2016  . Hyperlipidemia with target low density lipoprotein (LDL) cholesterol less than 70 mg/dL 06/14/2008   Qualifier: Diagnosis of  By: Mare Ferrari, RMA, [name]    . Hypertension   . Hypertensive heart disease 06/14/2008   Qualifier: Diagnosis of  By: Mare Ferrari, [place], [name]    . Hypertensive urgency, malignant 04/16/2012  . Hypokalemia 04/14/2017  . Internal hemorrhoids 04/19/2012  . Ischemic stroke ([place]) 05/02/2016  . Non-ST elevation (NSTEMI) myocardial infarction ([place])   . pandiverticulosis 04/22/2012   12/17/2011. [facility]. Juanita Craver MD. Colonoscopy. Moderate sized internal hemorrhoids and extensive pandiverticulosis. Repeat 5 years.    . Pericardial effusion    Echo 11/19: mild focal basal septal hypertrophy, EF 55-60, Gr 2 DD, trivial AI, trivial TR, trivial PI, small to mod eff post to heart - no evidence of RV collapse.>> repeat limited echo in 03/2018 // Echo 03/2018: EF 55-60, mild LVH, +diastolic dysfunction, no RWMA, PASP 23, trivial pericardial effusion   . Polycythemia vera(238.4) 04/17/2012  . S/P CABG x 4 04/17/2017  . Syncope and collapse 04/17/2012   Pt syncopized while sitting in bed giving history @ time of admission to hospital Tachycardic (appeared sinus) to 120s-130s and hypotensive to 50s/30s.  Unresponsive initially >> Spontaneously resolved after 2-3 minutes >> return to baseline ~30 minutes   . Vertigo     Past Surgical  History:  Procedure Laterality Date  . CORONARY ARTERY BYPASS GRAFT N/A 04/17/2017   Procedure: CORONARY ARTERY BYPASS GRAFTING (CABG) x4 , using left internal mammary artery  to LAD and right leg greater saphenous vein harvested endoscopically  to PDA, Diagonal I and Circumflex;  Surgeon: [name], MD;  Location: [facility];  Service: Open Heart Surgery;  Laterality: N/A;  . CORONARY STENT PLACEMENT    . LEFT HEART CATH AND CORONARY ANGIOGRAPHY N/A 04/16/2017   Procedure: LEFT  HEART CATH AND CORONARY ANGIOGRAPHY;  Surgeon: Sherren Mocha, MD;  Location: [facility];  Service: Cardiovascular;  Laterality: N/A;  . TEE WITHOUT CARDIOVERSION N/A 04/17/2017   Procedure: TRANSESOPHAGEAL ECHOCARDIOGRAM (TEE);  Surgeon: [name], MD;  Location: [facility];  Service: Open Heart Surgery;  Laterality: N/A;    Current Medications: Current Meds  Medication Sig  . acetaminophen (TYLENOL) 325 MG tablet Take 325 mg by mouth daily.  Marland Kitchen aspirin EC 81 MG tablet Take 81 mg by mouth daily.  . carvedilol (COREG) 12.5 MG tablet Take 1 tablet (12.5 mg total) by mouth 2 (two) times daily.  . clopidogrel (PLAVIX) 75 MG tablet Take 1 tablet (75 mg total) by mouth daily.  . furosemide (LASIX) 40 MG tablet Take 1 tablet (40 mg total) by mouth every other day.  . potassium chloride SA (KLOR-CON) 20 MEQ tablet Take 1 tablet (20 mEq total) by mouth every other day.  . rosuvastatin (CRESTOR) 40 MG tablet Take 1 tablet (40 mg total) by mouth daily.  . valsartan (DIOVAN) 320 MG tablet Take 1 tablet (320 mg total) by mouth daily.     Allergies:   Lipitor [atorvastatin], Morphine and related, and Pork-derived products   Social History   Socioeconomic History  . Marital status: Married    Spouse name: Not on file  . Number of children: Not on file  . Years of education: Not on file  . Highest education level: Not on file  Occupational History  . Not on file  Tobacco Use  . Smoking status: Never Smoker  . Smokeless tobacco: Never Used  Vaping Use  . Vaping Use: Never used  Substance and Sexual Activity  . Alcohol use: No    Alcohol/week: 0.0 standard drinks  . Drug use: No  . Sexual activity: Not on file  Other Topics Concern  . Not on file  Social History Narrative   Lives in [place] with Wife and 2 sons.  From [place].  To [place] ~2000.     Currently retired but worked in [place] Strain: Not on [employer] Insecurity: Not on file  Transportation Needs: Not on file  Physical Activity: Not on file  Stress: Not on file  Social Connections: Not on file     Family History: The patient's family history includes Cancer in his sister; Heart disease in his mother.  ROS:   Please see the history of present illness.    All other systems reviewed and are negative.  EKGs/Labs/Other Studies Reviewed:    The following studies were reviewed today: Echo 08/26/2019: IMPRESSIONS    1. Left ventricular ejection fraction, by estimation, is 60 to 65%. The  left ventricle has normal function. The left ventricle has no regional  wall motion abnormalities. Left ventricular diastolic parameters are  consistent with Grade II diastolic  dysfunction (pseudonormalization). Elevated left ventricular end-diastolic  pressure.  2. Right ventricular systolic  function is normal. The right ventricular  size is normal. Tricuspid regurgitation signal is inadequate for assessing  PA pressure.  3. Left atrial size was mild to moderately dilated.  4. Right atrial size was mildly dilated.  5. The mitral valve is normal in structure. Trivial mitral valve  regurgitation. No evidence of mitral stenosis.  6. The aortic valve is tricuspid. Aortic valve regurgitation is trivial.  No aortic stenosis is present.  7. The inferior vena cava is normal in size with greater than 50%  respiratory variability, suggesting right atrial pressure of 3 mmHg.   EKG:  EKG is ordered today.  The ekg ordered today demonstrates normal sinus rhythm 60 bpm, within normal limits  Recent Labs: 08/14/2019: ALT 9; BUN 9; Creatinine, Ser 0.99; Hemoglobin 13.8; Platelets 238; Potassium 4.0; Sodium 138  Recent Lipid Panel    Component Value Date/Time   CHOL 105 08/14/2019 0956   TRIG 96 08/14/2019 0956   HDL 37 (L) 08/14/2019 0956   CHOLHDL 2.8 08/14/2019 0956   CHOLHDL 4.5 04/15/2017 0241   VLDL 9 04/15/2017 0241   LDLCALC 50  08/14/2019 0956   LDLDIRECT 150.8 06/30/2008 1007     Risk Assessment/Calculations:       Physical Exam:    VS:  BP 140/90   Pulse 60   Ht _0  (1.676 m)   Wt 176 lb (79.8 kg)   SpO2 96%   BMI 28.41 kg/m     Wt Readings from Last 3 Encounters:  05/25/20 176 lb (79.8 kg)  11/25/19 180 lb 3.2 oz (81.7 kg)  08/14/19 180 lb 3.2 oz (81.7 kg)     GEN:  Well nourished, well developed elderly male in no acute distress, tearful/emotional at times HEENT: Normal NECK: No JVD; No carotid bruits LYMPHATICS: No lymphadenopathy CARDIAC: RRR, no murmurs, rubs, gallops RESPIRATORY:  Clear to auscultation without rales, wheezing or rhonchi  ABDOMEN: Soft, non-tender, non-distended MUSCULOSKELETAL:  No edema; No deformity  SKIN: Warm and dry NEUROLOGIC:  Alert and oriented x 3 PSYCHIATRIC:  Normal affect   ASSESSMENT:    1. Fatigue, unspecified type   2. Weakness of both lower extremities   3. Coronary artery disease involving native coronary artery of native heart without angina pectoris   4. Essential hypertension   5. Mixed hyperlipidemia    PLAN:    In order of problems listed above:  1. We will check some baseline labs to include a CBC and metabolic panel.  Most recent echo is reviewed and LV function is normal.  I do not think this is heart related. 2. I asked the patient to follow-up with his primary care physician.  We will check a CK level with his blood work to make sure that he is not developing a statin related myopathy.  We will go ahead and ask him to hold his statin drug for 4 weeks. 3. Treated with aspirin and clopidogrel, beta-blocker, and a statin drug. 4. Blood pressure above goal, 140/90 today.  Considering all of his symptoms, I do not think we should increase his medicines at this time.  Will follow.  Check labs today as above. 5. Statin holiday x4 weeks, then reassess.        Medication Adjustments/Labs and Tests Ordered: Current medicines are reviewed  at length with the patient today.  Concerns regarding medicines are outlined above.  Orders Placed This Encounter  Procedures  . CBC with Differential/Platelet  . Comprehensive metabolic panel  . CK Total (and  CKMB)  . EKG 12-Lead   No orders of the defined types were placed in this encounter.   Patient Instructions  Medication Instructions:  1) HOLD CRESTOR. We will call you in a few weeks to see how you are doing. *If you need a refill on your cardiac medications before your next appointment, please call your pharmacy*  Lab Work: TODAY! CBC, CMET, CK If you have labs (blood work) drawn today and your tests are completely normal, you will receive your results only by: Marland Kitchen MyChart Message (if you have MyChart) OR . A paper copy in the mail If you have any lab test that is abnormal or we need to change your treatment, we will call you to review the results.  Follow-Up: At [facility], you and your health needs are our priority.  As part of our continuing mission to provide you with exceptional heart care, we have created designated Provider Care Teams.  These Care Teams include your primary Cardiologist (physician) and Advanced Practice Providers (APPs -  Physician Assistants and Nurse Practitioners) who all work together to provide you with the care you need, when you need it. Your next appointment:   6 month(s) The format for your next appointment:   In Person Provider:   You may see Sherren Mocha, MD or one of the following Advanced Practice Providers on your designated Care Team:    Richardson Dopp, PA-C  Robbie Lis, [place]      Signed, Sherren Mocha, MD  05/25/2020 5:22 PM    [facility]

## 2020-05-25 NOTE — Patient Instructions (Signed)
Medication Instructions:  1) HOLD CRESTOR. We will call you in a few weeks to see how you are doing. *If you need a refill on your cardiac medications before your next appointment, please call your pharmacy*  Lab Work: TODAY! CBC, CMET, CK If you have labs (blood work) drawn today and your tests are completely normal, you will receive your results only by: Marland Kitchen MyChart Message (if you have MyChart) OR . A paper copy in the mail If you have any lab test that is abnormal or we need to change your treatment, we will call you to review the results.  Follow-Up: At [facility], you and your health needs are our priority.  As part of our continuing mission to provide you with exceptional heart care, we have created designated Provider Care Teams.  These Care Teams include your primary Cardiologist (physician) and Advanced Practice Providers (APPs -  Physician Assistants and Nurse Practitioners) who all work together to provide you with the care you need, when you need it. Your next appointment:   6 month(s) The format for your next appointment:   In Person Provider:   You may see Sherren Mocha, MD or one of the following Advanced Practice Providers on your designated Care Team:    Richardson Dopp, PA-C  [place], [place]

## 2020-05-26 LAB — CBC WITH DIFFERENTIAL/PLATELET
Basophils Absolute: 0.1 10*3/uL (ref 0.0–0.2)
Basos: 1 %
EOS (ABSOLUTE): 0.5 10*3/uL — ABNORMAL HIGH (ref 0.0–0.4)
Eos: 5 %
Hematocrit: 47.1 % (ref 37.5–51.0)
Hemoglobin: 15.8 g/dL (ref 13.0–17.7)
Immature Grans (Abs): 0 10*3/uL (ref 0.0–0.1)
Immature Granulocytes: 0 %
Lymphocytes Absolute: 2.5 10*3/uL (ref 0.7–3.1)
Lymphs: 25 %
MCH: 29.6 pg (ref 26.6–33.0)
MCHC: 33.5 g/dL (ref 31.5–35.7)
MCV: 88 fL (ref 79–97)
Monocytes Absolute: 0.6 10*3/uL (ref 0.1–0.9)
Monocytes: 6 %
Neutrophils Absolute: 6.6 10*3/uL (ref 1.4–7.0)
Neutrophils: 63 %
Platelets: 244 10*3/uL (ref 150–450)
RBC: 5.34 x10E6/uL (ref 4.14–5.80)
RDW: 13.9 % (ref 11.6–15.4)
WBC: 10.3 10*3/uL (ref 3.4–10.8)

## 2020-05-26 LAB — COMPREHENSIVE METABOLIC PANEL
ALT: 15 IU/L (ref 0–44)
AST: 19 IU/L (ref 0–40)
Albumin/Globulin Ratio: 1.3 (ref 1.2–2.2)
Albumin: 4.3 g/dL (ref 3.7–4.7)
Alkaline Phosphatase: 104 IU/L (ref 44–121)
BUN/Creatinine Ratio: 18 (ref 10–24)
BUN: 15 mg/dL (ref 8–27)
Bilirubin Total: 0.7 mg/dL (ref 0.0–1.2)
CO2: 25 mmol/L (ref 20–29)
Calcium: 9.5 mg/dL (ref 8.6–10.2)
Chloride: 99 mmol/L (ref 96–106)
Creatinine, Ser: 0.82 mg/dL (ref 0.76–1.27)
Globulin, Total: 3.3 g/dL (ref 1.5–4.5)
Glucose: 105 mg/dL — ABNORMAL HIGH (ref 65–99)
Potassium: 4.5 mmol/L (ref 3.5–5.2)
Sodium: 140 mmol/L (ref 134–144)
Total Protein: 7.6 g/dL (ref 6.0–8.5)
eGFR: 90 mL/min/{1.73_m2} (ref >59)

## 2020-05-26 LAB — CK TOTAL AND CKMB (NOT AT ARMC)
CK-MB Index: 1.5 ng/mL (ref 0.0–10.4)
Total CK: 71 U/L (ref 41–331)

## 2020-05-30 ENCOUNTER — Other Ambulatory Visit: Payer: Self-pay

## 2020-05-31 ENCOUNTER — Encounter: Payer: Self-pay | Admitting: Emergency Medicine

## 2020-05-31 ENCOUNTER — Ambulatory Visit (INDEPENDENT_AMBULATORY_CARE_PROVIDER_SITE_OTHER): Payer: Medicare HMO | Admitting: Emergency Medicine

## 2020-05-31 VITALS — BP 138/80 | HR 63 | Temp 97.8°F | Ht 66.75 in | Wt 181.0 lb

## 2020-05-31 DIAGNOSIS — I700 Atherosclerosis of aorta: Secondary | ICD-10-CM

## 2020-05-31 DIAGNOSIS — I2510 Atherosclerotic heart disease of native coronary artery without angina pectoris: Secondary | ICD-10-CM | POA: Diagnosis not present

## 2020-05-31 DIAGNOSIS — I25118 Atherosclerotic heart disease of native coronary artery with other forms of angina pectoris: Secondary | ICD-10-CM | POA: Diagnosis not present

## 2020-05-31 DIAGNOSIS — R29898 Other symptoms and signs involving the musculoskeletal system: Secondary | ICD-10-CM | POA: Diagnosis not present

## 2020-05-31 DIAGNOSIS — R531 Weakness: Secondary | ICD-10-CM

## 2020-05-31 DIAGNOSIS — R5381 Other malaise: Secondary | ICD-10-CM

## 2020-05-31 DIAGNOSIS — R269 Unspecified abnormalities of gait and mobility: Secondary | ICD-10-CM | POA: Diagnosis not present

## 2020-05-31 DIAGNOSIS — I2583 Coronary atherosclerosis due to lipid rich plaque: Secondary | ICD-10-CM

## 2020-05-31 DIAGNOSIS — K5730 Diverticulosis of large intestine without perforation or abscess without bleeding: Secondary | ICD-10-CM | POA: Diagnosis not present

## 2020-05-31 DIAGNOSIS — E1169 Type 2 diabetes mellitus with other specified complication: Secondary | ICD-10-CM

## 2020-05-31 DIAGNOSIS — I110 Hypertensive heart disease with heart failure: Secondary | ICD-10-CM

## 2020-05-31 DIAGNOSIS — I5032 Chronic diastolic (congestive) heart failure: Secondary | ICD-10-CM

## 2020-05-31 DIAGNOSIS — R5382 Chronic fatigue, unspecified: Secondary | ICD-10-CM

## 2020-05-31 DIAGNOSIS — I10 Essential (primary) hypertension: Secondary | ICD-10-CM

## 2020-05-31 LAB — CBC WITH DIFFERENTIAL/PLATELET
Basophils Absolute: 0.1 10*3/uL (ref 0.0–0.1)
Basophils Relative: 1 % (ref 0.0–3.0)
Eosinophils Absolute: 0.5 10*3/uL (ref 0.0–0.7)
Eosinophils Relative: 5.5 % — ABNORMAL HIGH (ref 0.0–5.0)
HCT: 43.3 % (ref 39.0–52.0)
Hemoglobin: 14.4 g/dL (ref 13.0–17.0)
Lymphocytes Relative: 24 % (ref 12.0–46.0)
Lymphs Abs: 2.2 10*3/uL (ref 0.7–4.0)
MCHC: 33.2 g/dL (ref 30.0–36.0)
MCV: 88.1 fl (ref 78.0–100.0)
Monocytes Absolute: 0.6 10*3/uL (ref 0.1–1.0)
Monocytes Relative: 6.4 % (ref 3.0–12.0)
Neutro Abs: 5.8 10*3/uL (ref 1.4–7.7)
Neutrophils Relative %: 63.1 % (ref 43.0–77.0)
Platelets: 217 10*3/uL (ref 150.0–400.0)
RBC: 4.91 Mil/uL (ref 4.22–5.81)
RDW: 15 % (ref 11.5–15.5)
WBC: 9.2 10*3/uL (ref 4.0–10.5)

## 2020-05-31 LAB — COMPREHENSIVE METABOLIC PANEL
ALT: 15 U/L (ref 0–53)
AST: 18 U/L (ref 0–37)
Albumin: 3.7 g/dL (ref 3.5–5.2)
Alkaline Phosphatase: 78 U/L (ref 39–117)
BUN: 17 mg/dL (ref 6–23)
CO2: 28 mEq/L (ref 19–32)
Calcium: 9.6 mg/dL (ref 8.4–10.5)
Chloride: 105 mEq/L (ref 96–112)
Creatinine, Ser: 0.85 mg/dL (ref 0.40–1.50)
GFR: 83.49 mL/min (ref >60.00)
Glucose, Bld: 95 mg/dL (ref 70–99)
Potassium: 4.5 mEq/L (ref 3.5–5.1)
Sodium: 139 mEq/L (ref 135–145)
Total Bilirubin: 0.7 mg/dL (ref 0.2–1.2)
Total Protein: 7.6 g/dL (ref 6.0–8.3)

## 2020-05-31 LAB — LIPID PANEL
Cholesterol: 111 mg/dL (ref 0–200)
HDL: 40.3 mg/dL (ref >39.00)
LDL Cholesterol: 56 mg/dL (ref 0–99)
NonHDL: 70.5
Total CHOL/HDL Ratio: 3
Triglycerides: 74 mg/dL (ref 0.0–149.0)
VLDL: 14.8 mg/dL (ref 0.0–40.0)

## 2020-05-31 LAB — HEMOGLOBIN A1C: Hgb A1c MFr Bld: 6.1 % (ref 4.6–6.5)

## 2020-05-31 LAB — VITAMIN B12: Vitamin B-12: 497 pg/mL (ref 211–911)

## 2020-05-31 LAB — TSH: TSH: 2.26 u[IU]/mL (ref 0.35–4.50)

## 2020-05-31 NOTE — Progress Notes (Signed)
[name] 80 y.o. ASSESSMENT:    1. Fatigue, unspecified type   2. Weakness of both lower extremities   3. Coronary artery disease involving native coronary artery of native heart without angina pectoris   4. Essential hypertension   5. Mixed hyperlipidemia    PLAN:    In order of problems listed above:  1. We will check some baseline labs to include a CBC and metabolic panel.  Most recent echo is reviewed and LV function is normal.  I do not think this is heart related. 2. I asked the patient to follow-up with his primary care physician.  We will check a CK level with his blood work to make sure that he is not developing a statin related myopathy.  We will go ahead and ask him to hold his statin drug for 4 weeks. 3. Treated with aspirin and clopidogrel, beta-blocker, and a statin drug. 4. Blood pressure above goal, 140/90 today.  Considering all of his symptoms, I do not think we should increase his medicines at this time.  Will follow.  Check labs today as above. 5. Statin holiday x4 weeks, then reassess.   No chief complaint on file.   HISTORY OF PRESENT ILLNESS: This is a 80 y.o. male complaining of bilateral leg weakness for the past several months along with gait problems and balance issues. Also complaining of general malaise and fatigue for several months. Patient is diabetic with hypertensive heart disease, coronary artery disease, and congestive heart failure. Compliant with medications.  Non-smoker. No other complaints or medical concerns today. Recent visit with cardiologist on 05/25/2020 reviewed.  HPI   Prior to Admission medications   Medication Sig Start Date End Date Taking? Authorizing Provider  acetaminophen (TYLENOL) 325 MG tablet Take 325 mg by mouth daily.    [provider]  aspirin EC 81 MG tablet Take 81 mg by mouth daily.    [provider]  carvedilol (COREG) 12.5 MG tablet Take 1 tablet (12.5 mg total) by mouth 2 (two)  times daily. 11/25/19   Leanor Kail, PA  clopidogrel (PLAVIX) 75 MG tablet Take 1 tablet (75 mg total) by mouth daily. 04/07/20 04/02/21  Leanor Kail, PA  furosemide (LASIX) 40 MG tablet Take 1 tablet (40 mg total) by mouth every other day. 04/07/20 04/02/21  Bhagat, Crista Luria, PA  potassium chloride SA (KLOR-CON) 20 MEQ tablet Take 1 tablet (20 mEq total) by mouth every other day. 04/07/20 04/02/21  Leanor Kail, PA  rosuvastatin (CRESTOR) 40 MG tablet Take 1 tablet (40 mg total) by mouth daily. 04/07/20 04/02/21  Bhagat, Crista Luria, PA  valsartan (DIOVAN) 320 MG tablet Take 1 tablet (320 mg total) by mouth daily. 04/07/20 04/02/21  Leanor Kail, PA    Allergies  Allergen Reactions  . Lipitor [Atorvastatin] Shortness Of Breath and Other (See Comments)    Sores non head  . Morphine And Related Shortness Of Breath, Swelling and Rash    Throat swelling  . Pork-Derived Products Swelling and Rash    Tongue swelling No pork products -     Patient Active Problem List   Diagnosis Date Noted  . Type 2 diabetes mellitus with other specified complication, without long-term current use of insulin ([place]) 05/31/2020  . Aortic atherosclerosis ([place]) 05/31/2020  . Acute on chronic diastolic heart failure ([place]) 02/14/2018  . Pericardial effusion   . Abnormal CT of the chest 11/05/2017  . Non-ST elevation (NSTEMI) myocardial infarction ([place])   . Essential hypertension 05/17/2016  . Ischemic  stroke ([place]) 05/02/2016  . pandiverticulosis 04/22/2012  . Internal hemorrhoids 04/19/2012  . Hyperlipidemia with target low density lipoprotein (LDL) cholesterol less than 70 mg/dL 06/14/2008  . Hypertensive heart disease 06/14/2008  . Coronary artery disease of native heart with stable angina pectoris, unspecified vessel or lesion type ([place]) 06/14/2008    Past Medical History:  Diagnosis Date  . Blurred vision 05/17/2016  . CAD (coronary artery disease), native coronary artery 06/14/2008    a. BMS to LCx & OM 2008 b. 03/2017 CABG x 4 (LIMA to LAD, SVG to diag 1, SVG to distal Circ, SVG to PDA)  . Chest pain 04/16/2012  . Diplopia   . Echocardiogram    Echo 10/19: mod LVH, EF 55-60, no RWMA, Gr 1 DD, trivial AI, MAC, mod LAE, prob small post effusion  . Essential hypertension 05/17/2016  . Hyperlipidemia with target low density lipoprotein (LDL) cholesterol less than 70 mg/dL 06/14/2008   Qualifier: Diagnosis of  By: Mare Ferrari, RMA, [name]    . Hypertension   . Hypertensive heart disease 06/14/2008   Qualifier: Diagnosis of  By: Mare Ferrari, [place], [name]    . Hypertensive urgency, malignant 04/16/2012  . Hypokalemia 04/14/2017  . Internal hemorrhoids 04/19/2012  . Ischemic stroke ([place]) 05/02/2016  . Non-ST elevation (NSTEMI) myocardial infarction ([place])   . pandiverticulosis 04/22/2012   12/17/2011. [place]. Juanita Craver MD. Colonoscopy. Moderate sized internal hemorrhoids and extensive pandiverticulosis. Repeat 5 years.    . Pericardial effusion    Echo 11/19: mild focal basal septal hypertrophy, EF 55-60, Gr 2 DD, trivial AI, trivial TR, trivial PI, small to mod eff post to heart - no evidence of RV collapse.>> repeat limited echo in 03/2018 // Echo 03/2018: EF 55-60, mild LVH, +diastolic dysfunction, no RWMA, PASP 23, trivial pericardial effusion   . Polycythemia vera(238.4) 04/17/2012  . S/P CABG x 4 04/17/2017  . Syncope and collapse 04/17/2012   Pt syncopized while sitting in bed giving history @ time of admission to hospital Tachycardic (appeared sinus) to 120s-130s and hypotensive to 50s/30s.  Unresponsive initially >> Spontaneously resolved after 2-3 minutes >> return to baseline ~30 minutes   . Vertigo     Past Surgical History:  Procedure Laterality Date  . CORONARY ARTERY BYPASS GRAFT N/A 04/17/2017   Procedure: CORONARY ARTERY BYPASS GRAFTING (CABG) x4 , using left internal mammary artery  to LAD and right leg greater saphenous vein harvested endoscopically  to PDA,  Diagonal I and Circumflex;  Surgeon: [name], MD;  Location: [facility];  Service: Open Heart Surgery;  Laterality: N/A;  . CORONARY ARTERY BYPASS GRAFT  2020  . CORONARY STENT PLACEMENT    . LEFT HEART CATH AND CORONARY ANGIOGRAPHY N/A 04/16/2017   Procedure: LEFT HEART CATH AND CORONARY ANGIOGRAPHY;  Surgeon: Sherren Mocha, MD;  Location: [facility];  Service: Cardiovascular;  Laterality: N/A;  . TEE WITHOUT CARDIOVERSION N/A 04/17/2017   Procedure: TRANSESOPHAGEAL ECHOCARDIOGRAM (TEE);  Surgeon: [name], MD;  Location: [facility];  Service: Open Heart Surgery;  Laterality: N/A;    Social History   Socioeconomic History  . Marital status: Married    Spouse name: Not on file  . Number of children: Not on file  . Years of education: Not on file  . Highest education level: Not on file  Occupational History  . Not on file  Tobacco Use  . Smoking status: Never Smoker  . Smokeless tobacco: Never Used  Vaping Use  . Vaping  Use: Never used  Substance and Sexual Activity  . Alcohol use: No    Alcohol/week: 0.0 standard drinks  . Drug use: No  . Sexual activity: Not on file  Other Topics Concern  . Not on file  Social History Narrative   Lives in [place] with Wife and 2 sons.  From [place].  To [place] ~2000.     Currently retired but worked in [place] Strain: Not on [employer] Insecurity: Not on file  Transportation Needs: Not on file  Physical Activity: Not on file  Stress: Not on file  Social Connections: Not on file  Intimate Partner Violence: Not on file    Family History  Problem Relation Age of Onset  . Heart disease Mother   . Cancer Sister      Review of Systems  Constitutional: Negative.  Negative for chills and fever.  HENT: Negative.  Negative for congestion and sore throat.   Respiratory: Negative.  Negative for cough and shortness of breath.   Cardiovascular: Negative.   Negative for chest pain and palpitations.  Gastrointestinal: Negative.  Negative for abdominal pain, diarrhea, nausea and vomiting.  Genitourinary: Negative.  Negative for dysuria and hematuria.  Skin: Negative.  Negative for rash.  Neurological: Negative.  Negative for dizziness and headaches.  All other systems reviewed and are negative.   Today's Vitals   05/31/20 1307 05/31/20 1330  BP: (!) 164/100 138/80  Pulse: 63   Temp: 97.8 F (36.6 C)   TempSrc: Oral   SpO2: 97%   Weight: 181 lb (82.1 kg)   Height: 5' 6.75" (1.695 m)    Body mass index is 28.56 kg/m.  Body mass index is 28.56 kg/m. Wt Readings from Last 3 Encounters:  05/31/20 181 lb (82.1 kg)  05/25/20 176 lb (79.8 kg)  11/25/19 180 lb 3.2 oz (81.7 kg)    Physical Exam Vitals reviewed.  Constitutional:      Appearance: Normal appearance.  HENT:     Head: Normocephalic.     Mouth/Throat:     Mouth: Mucous membranes are moist.     Pharynx: Oropharynx is clear.  Eyes:     Extraocular Movements: Extraocular movements intact.     Conjunctiva/sclera: Conjunctivae normal.     Pupils: Pupils are equal, round, and reactive to light.  Cardiovascular:     Rate and Rhythm: Regular rhythm.     Pulses: Normal pulses.     Heart sounds: Normal heart sounds.  Pulmonary:     Effort: Pulmonary effort is normal.     Breath sounds: Normal breath sounds.  Abdominal:     General: There is no distension.     Palpations: Abdomen is soft.     Tenderness: There is no abdominal tenderness.  Musculoskeletal:        General: Normal range of motion.     Cervical back: Normal range of motion and neck supple.     Right lower leg: No edema.     Left lower leg: No edema.  Skin:    General: Skin is warm and dry.     Capillary Refill: Capillary refill takes less than 2 seconds.  Neurological:     General: No focal deficit present.     Mental Status: He is alert and oriented to person, place, and time.     Sensory: No sensory  deficit.     Motor: Weakness (Both lower extremities) present.  Psychiatric:        Mood and Affect: Mood normal.        Behavior: Behavior normal.    A total of 30 minutes was spent with the patient, greater than 50% of which was in counseling/coordination of care regarding differential diagnosis of bilateral leg weakness/gait problems and need for neurology evaluation and work-up, review of all medications, review of most recent office visit notes, review of most recent blood work results, prognosis, documentation and need for follow-up.   ASSESSMENT & PLAN:  Diagnoses and all orders for this visit:  Bilateral leg weakness -     Ambulatory referral to Neurology -     CBC with Differential/Platelet -     Comprehensive metabolic panel -     Vitamin B12 -     Lipid panel -     Hemoglobin A1c -     HIV Antibody (routine testing w rflx) -     Hepatitis C antibody -     TSH  Essential hypertension  pandiverticulosis  Hypertensive heart disease with chronic diastolic congestive heart failure (HCC)  CAD s/p CABG x4  Type 2 diabetes mellitus with other specified complication, without long-term current use of insulin (HCC)  Aortic atherosclerosis (HCC)  Coronary artery disease of native heart with stable angina pectoris, unspecified vessel or lesion type (HCC)  Gait disturbance  Chronic fatigue and malaise     Patient Instructions   Mantenimiento de la salud despus de los 69 aos de edad Health Maintenance After Age 6 Despus de los 65 aos de edad, corre un riesgo mayor de Tourist information centre manager enfermedades e infecciones a Barrister's clerk, como tambin de sufrir lesiones por cadas. Las cadas son la causa principal de las fracturas de huesos y lesiones en la cabeza de personas mayores de 44 aos de edad. Recibir cuidados preventivos de forma regular puede ayudarlo a mantenerse saludable y en buen [place]. Los cuidados preventivos incluyen realizarse anlisis de forma regular y  Actor en el estilo de vida segn las recomendaciones del mdico. Converse con el profesional que lo asiste sobre:  Las pruebas de deteccin y los anlisis que debe Dispensing optician. Una prueba de deteccin es un estudio que se para Hydrographic surveyor la presencia de una enfermedad cuando no tiene sntomas.  Un plan de dieta y ejercicios adecuado para usted. Qu debo saber sobre las pruebas de deteccin y los anlisis para prevenir cadas? Realizarse pruebas de deteccin y [employer] es la mejor manera de Hydrographic surveyor un problema de salud de forma temprana. El diagnstico y tratamiento tempranos le brindan la mejor oportunidad de Chief Technology Officer las afecciones mdicas que son comunes despus de los 96 aos de edad. Ciertas afecciones y elecciones de estilo de vida pueden hacer que sea ms propenso a sufrir Engineer, manufacturing. El mdico puede recomendarle lo siguiente:  Controles regulares de la visin. Una visin deficiente y afecciones como las cataratas pueden hacer que sea ms propenso a sufrir Engineer, manufacturing. Si [place] lentes, asegrese de obtener una receta actualizada si su visin cambia.  Revisin de medicamentos. Revise regularmente con el mdico todos los medicamentos que toma, incluidos los medicamentos de [place]. Consulte al [employer] efectos secundarios que pueden hacer que sea ms propenso a sufrir Engineer, manufacturing. Informe al mdico si alguno de los medicamentos que toma lo hace sentir mareado o somnoliento.  Pruebas de deteccin para la osteoporosis. La osteoporosis es una afeccin que hace que los huesos se vuelvan ms frgiles. En consecuencia, los [employer]  debilitarse y quebrarse ms fcilmente.  Pruebas de deteccin para la presin arterial. Los cambios en la presin arterial y los medicamentos para Chief Technology Officer la presin arterial pueden hacerlo sentir mareado.  Controles de fuerza y equilibrio. El mdico puede recomendar ciertos estudios para controlar su fuerza y equilibrio al estar de pie, al caminar o  al cambiar de posicin.  Examen de los pies. El dolor y Chiropractor en los pies, como tambin no utilizar el calzado [place], pueden hacer que sea ms propenso a sufrir Engineer, manufacturing.  Prueba de deteccin de la depresin. Es ms probable que sufra una cada si tiene miedo a caerse, se siente mal emocionalmente o se siente incapaz de Patent examiner.  Prueba de deteccin de consumo de alcohol. Beber demasiado alcohol puede afectar su equilibrio y puede hacer que sea ms propenso a sufrir Engineer, manufacturing. Qu medidas puedo tomar para reducir mi riesgo de sufrir una cada? Instrucciones generales  Hable con el mdico sobre sus riesgos de sufrir una cada. Infrmele a su mdico si: ? Se cae. Asegrese de informarle a su mdico acerca de todas las cadas, incluso aquellas que parecen ser [employer] ? Se siente mareado, somnoliento o que pierde el equilibrio.  Tome los medicamentos de venta libre y los recetados solamente como se lo haya indicado el mdico. Estos incluyen todos los suplementos.  Siga una dieta sana y [place] un peso saludable. Una dieta saludable incluye productos lcteos descremados, carnes bajas en contenido de grasa ([place], [place] de granos enteros, frijoles y [place] frutas y verduras. La seguridad en el hogar  Retire los objetos que puedan causar tropiezos tales como alfombras, cables u obstculos.  Instale equipos de seguridad, como barras para sostn en los baos y barandas de seguridad en las escaleras.  [place] habitaciones y los pasillos bien iluminados. Actividad  Siga un programa de ejercicio regular para mantenerse en forma. Esto lo ayudar a Contractor equilibrio. Consulte al mdico qu tipos de ejercicios son adecuados para usted.  Si necesita un bastn o un andador, selo segn las recomendaciones del mdico.  Utilice calzado con buen apoyo y suela antideslizante.   Estilo de vida  No beba alcohol si el mdico le indica que no  beba.  Si bebe alcohol, limite la cantidad que consume: ? De 0 a 1 medida por da para las mujeres. ? De 0 a 2 medidas por da para los hombres.  Est atento a la cantidad de alcohol que contiene su bebida. En los [place] una medida equivale a una botella tpica de cerveza (12 onzas), media copa de vino (5 onzas) o una medida de bebida blanca (1 onza).  No consuma ningn producto que contenga nicotina o tabaco, como cigarrillos y Psychologist, sport and exercise. Si necesita ayuda para dejar de fumar, consulte al mdico. Resumen  Tener un estilo de vida saludable y recibir cuidados preventivos pueden ayudar a Theatre stage manager salud y el bienestar despus de los 87 aos de [place].  Realizarse pruebas de deteccin y [employer] es la mejor manera de Hydrographic surveyor un problema de salud de forma temprana y Lourena Simmonds a Product/process development scientist una cada. El diagnstico y tratamiento tempranos le brindan la mejor oportunidad de Chief Technology Officer las afecciones mdicas ms comunes en las personas mayores de 25 aos de edad.  Las cadas son la causa principal de las fracturas de huesos y lesiones en la cabeza de personas mayores de 56 aos de edad. Tome precauciones para evitar una cada en su casa.  Trabaje con  el mdico para saber qu cambios que puede hacer para mejorar su salud y [place], y para prevenir las cadas. Esta informacin no tiene Marine scientist el consejo del mdico. Asegrese de hacerle al mdico cualquier pregunta que tenga. Document Revised: 03/21/2017 Document Reviewed: 03/21/2017 Elsevier Patient Education  2021 [place], MD [facility]

## 2020-05-31 NOTE — Patient Instructions (Signed)
Mantenimiento de la salud despus de los 22 aos de edad Health Maintenance After Age 78 Despus de los 65 aos de edad, corre un riesgo mayor de Tourist information centre manager enfermedades e infecciones a Barrister's clerk, como tambin de sufrir lesiones por cadas. Las cadas son la causa principal de las fracturas de huesos y lesiones en la cabeza de personas mayores de 83 aos de edad. Recibir cuidados preventivos de forma regular puede ayudarlo a mantenerse saludable y en buen [place]. Los cuidados preventivos incluyen realizarse anlisis de forma regular y Actor en el estilo de vida segn las recomendaciones del mdico. Converse con el profesional que lo asiste sobre:  Las pruebas de deteccin y los anlisis que debe Dispensing optician. Una prueba de deteccin es un estudio que se para Hydrographic surveyor la presencia de una enfermedad cuando no tiene sntomas.  Un plan de dieta y ejercicios adecuado para usted. Qu debo saber sobre las pruebas de deteccin y los anlisis para prevenir cadas? Realizarse pruebas de deteccin y [employer] es la mejor manera de Hydrographic surveyor un problema de salud de forma temprana. El diagnstico y tratamiento tempranos le brindan la mejor oportunidad de Chief Technology Officer las afecciones mdicas que son comunes despus de los 77 aos de edad. Ciertas afecciones y elecciones de estilo de vida pueden hacer que sea ms propenso a sufrir Engineer, manufacturing. El mdico puede recomendarle lo siguiente:  Controles regulares de la visin. Una visin deficiente y afecciones como las cataratas pueden hacer que sea ms propenso a sufrir Engineer, manufacturing. Si [place] lentes, asegrese de obtener una receta actualizada si su visin cambia.  Revisin de medicamentos. Revise regularmente con el mdico todos los medicamentos que toma, incluidos los medicamentos de [place]. Consulte al [employer] efectos secundarios que pueden hacer que sea ms propenso a sufrir Engineer, manufacturing. Informe al mdico si alguno de los medicamentos que toma lo hace  sentir mareado o somnoliento.  Pruebas de deteccin para la osteoporosis. La osteoporosis es una afeccin que hace que los huesos se vuelvan ms frgiles. En consecuencia, los huesos pueden debilitarse y quebrarse ms fcilmente.  Pruebas de deteccin para la presin arterial. Los cambios en la presin arterial y los medicamentos para Chief Technology Officer la presin arterial pueden hacerlo sentir mareado.  Controles de fuerza y equilibrio. El mdico puede recomendar ciertos estudios para controlar su fuerza y equilibrio al estar de pie, al caminar o al cambiar de posicin.  Examen de los pies. El dolor y Chiropractor en los pies, como tambin no utilizar el calzado [place], pueden hacer que sea ms propenso a sufrir Engineer, manufacturing.  Prueba de deteccin de la depresin. Es ms probable que sufra una cada si tiene miedo a caerse, se siente mal emocionalmente o se siente incapaz de Patent examiner.  Prueba de deteccin de consumo de alcohol. Beber demasiado alcohol puede afectar su equilibrio y puede hacer que sea ms propenso a sufrir Engineer, manufacturing. Qu medidas puedo tomar para reducir mi riesgo de sufrir una cada? Instrucciones generales  Hable con el mdico sobre sus riesgos de sufrir una cada. Infrmele a su mdico si: ? Se cae. Asegrese de informarle a su mdico acerca de todas las cadas, incluso aquellas que parecen ser [employer] ? Se siente mareado, somnoliento o que pierde el equilibrio.  Tome los medicamentos de venta libre y los recetados solamente como se lo haya indicado el mdico. Estos incluyen todos los suplementos.  Siga una dieta sana y [place] un peso saludable. Una dieta saludable incluye  productos lcteos descremados, carnes bajas en contenido de grasa ([place], fibra de granos enteros, frijoles y [place] frutas y verduras. La seguridad en el hogar  Retire los objetos que puedan causar tropiezos tales como alfombras, cables u obstculos.  Instale equipos de  seguridad, como barras para sostn en los baos y barandas de seguridad en las escaleras.  [place] habitaciones y los pasillos bien iluminados. Actividad  Siga un programa de ejercicio regular para mantenerse en forma. Esto lo ayudar a Contractor equilibrio. Consulte al mdico qu tipos de ejercicios son adecuados para usted.  Si necesita un bastn o un andador, selo segn las recomendaciones del mdico.  Utilice calzado con buen apoyo y suela antideslizante.   Estilo de vida  No beba alcohol si el mdico le indica que no beba.  Si bebe alcohol, limite la cantidad que consume: ? De 0 a 1 medida por da para las mujeres. ? De 0 a 2 medidas por da para los hombres.  Est atento a la cantidad de alcohol que contiene su bebida. En los [place]. UU[place] una medida equivale a una botella tpica de cerveza (12 onzas), media copa de vino (5 onzas) o una medida de bebida blanca (1 onza).  No consuma ningn producto que contenga nicotina o tabaco, como cigarrillos y Psychologist, sport and exercise. Si necesita ayuda para dejar de fumar, consulte al mdico. Resumen  Tener un estilo de vida saludable y recibir cuidados preventivos pueden ayudar a Theatre stage manager salud y el bienestar despus de los 30 aos de [place].  Realizarse pruebas de deteccin y [employer] es la mejor manera de Hydrographic surveyor un problema de salud de forma temprana y Lourena Simmonds a Product/process development scientist una cada. El diagnstico y tratamiento tempranos le brindan la mejor oportunidad de Chief Technology Officer las afecciones mdicas ms comunes en las personas mayores de 76 aos de edad.  Las cadas son la causa principal de las fracturas de huesos y lesiones en la cabeza de personas mayores de 28 aos de edad. Tome precauciones para evitar una cada en su casa.  Trabaje con el mdico para saber qu cambios que puede hacer para mejorar su salud y [place], y [place]. Esta informacin no tiene Marine scientist el consejo del mdico. Asegrese de hacerle al  mdico cualquier pregunta que tenga. Document Revised: 03/21/2017 Document Reviewed: 03/21/2017 Elsevier Patient Education  2021 [employer].

## 2020-06-01 LAB — HEPATITIS C ANTIBODY
Hepatitis C Ab: NONREACTIVE
SIGNAL TO CUT-OFF: 0.02 (ref <1.00)

## 2020-06-01 LAB — HIV ANTIBODY (ROUTINE TESTING W REFLEX): HIV 1&2 Ab, 4th Generation: NONREACTIVE

## 2020-06-27 ENCOUNTER — Telehealth: Payer: Self-pay | Admitting: Emergency Medicine

## 2020-06-27 NOTE — Telephone Encounter (Signed)
Called pt to schedule AWV with [facility]. Home # is not in service and mobile # has a voice mail that has not been set up yet.

## 2020-07-27 ENCOUNTER — Telehealth: Payer: Self-pay

## 2020-07-27 DIAGNOSIS — I10 Essential (primary) hypertension: Secondary | ICD-10-CM

## 2020-07-27 MED ORDER — CARVEDILOL 12.5 MG PO TABS: 12.5000 mg | ORAL_TABLET | Freq: Two times a day (BID) | ORAL | 3 refills | Status: DC

## 2020-07-27 NOTE — Telephone Encounter (Signed)
The patient showed up to Clinic with a bag of medications asking for refills. Reiterated to the patient he should not come to Clinic to request refills and that his pharmacy should be notified.  Reviewed meds - all refills are through Feb 2023 at [facility] (requested pharmacy) except Coreg.  Coreg refilled.

## 2020-08-08 ENCOUNTER — Other Ambulatory Visit: Payer: Self-pay

## 2020-08-08 ENCOUNTER — Emergency Department (HOSPITAL_COMMUNITY): Payer: Medicare HMO

## 2020-08-08 ENCOUNTER — Inpatient Hospital Stay (HOSPITAL_COMMUNITY)
Admission: EM | Admit: 2020-08-08 | Discharge: 2020-08-10 | DRG: 303 | Disposition: A | Discharge status: Home Health | Payer: Medicare HMO | Attending: Family Medicine | Admitting: Family Medicine

## 2020-08-08 ENCOUNTER — Telehealth: Payer: Self-pay | Admitting: Cardiovascular Disease

## 2020-08-08 ENCOUNTER — Encounter (HOSPITAL_COMMUNITY): Payer: Self-pay | Admitting: *Deleted

## 2020-08-08 DIAGNOSIS — R2681 Unsteadiness on feet: Secondary | ICD-10-CM | POA: Diagnosis not present

## 2020-08-08 DIAGNOSIS — R296 Repeated falls: Secondary | ICD-10-CM | POA: Diagnosis not present

## 2020-08-08 DIAGNOSIS — R079 Chest pain, unspecified: Secondary | ICD-10-CM | POA: Diagnosis not present

## 2020-08-08 DIAGNOSIS — I700 Atherosclerosis of aorta: Secondary | ICD-10-CM | POA: Diagnosis not present

## 2020-08-08 DIAGNOSIS — Z8249 Family history of ischemic heart disease and other diseases of the circulatory system: Secondary | ICD-10-CM | POA: Diagnosis not present

## 2020-08-08 DIAGNOSIS — I313 Pericardial effusion (noninflammatory): Secondary | ICD-10-CM | POA: Diagnosis not present

## 2020-08-08 DIAGNOSIS — Z951 Presence of aortocoronary bypass graft: Secondary | ICD-10-CM

## 2020-08-08 DIAGNOSIS — E785 Hyperlipidemia, unspecified: Secondary | ICD-10-CM | POA: Diagnosis present

## 2020-08-08 DIAGNOSIS — Z79899 Other long term (current) drug therapy: Secondary | ICD-10-CM

## 2020-08-08 DIAGNOSIS — S199XXA Unspecified injury of neck, initial encounter: Secondary | ICD-10-CM | POA: Diagnosis not present

## 2020-08-08 DIAGNOSIS — I318 Other specified diseases of pericardium: Secondary | ICD-10-CM

## 2020-08-08 DIAGNOSIS — I252 Old myocardial infarction: Secondary | ICD-10-CM | POA: Diagnosis not present

## 2020-08-08 DIAGNOSIS — Z7982 Long term (current) use of aspirin: Secondary | ICD-10-CM

## 2020-08-08 DIAGNOSIS — Z20822 Contact with and (suspected) exposure to covid-19: Secondary | ICD-10-CM | POA: Diagnosis not present

## 2020-08-08 DIAGNOSIS — I25119 Atherosclerotic heart disease of native coronary artery with unspecified angina pectoris: Principal | ICD-10-CM | POA: Diagnosis present

## 2020-08-08 DIAGNOSIS — M5030 Other cervical disc degeneration, unspecified cervical region: Secondary | ICD-10-CM | POA: Diagnosis not present

## 2020-08-08 DIAGNOSIS — M79605 Pain in left leg: Secondary | ICD-10-CM | POA: Diagnosis not present

## 2020-08-08 DIAGNOSIS — M2578 Osteophyte, vertebrae: Secondary | ICD-10-CM | POA: Diagnosis not present

## 2020-08-08 DIAGNOSIS — Z885 Allergy status to narcotic agent status: Secondary | ICD-10-CM | POA: Diagnosis not present

## 2020-08-08 DIAGNOSIS — R0789 Other chest pain: Secondary | ICD-10-CM | POA: Diagnosis not present

## 2020-08-08 DIAGNOSIS — Z7901 Long term (current) use of anticoagulants: Secondary | ICD-10-CM

## 2020-08-08 DIAGNOSIS — K5750 Diverticulosis of both small and large intestine without perforation or abscess without bleeding: Secondary | ICD-10-CM | POA: Diagnosis not present

## 2020-08-08 DIAGNOSIS — K219 Gastro-esophageal reflux disease without esophagitis: Secondary | ICD-10-CM | POA: Diagnosis present

## 2020-08-08 DIAGNOSIS — I208 Other forms of angina pectoris: Secondary | ICD-10-CM | POA: Diagnosis not present

## 2020-08-08 DIAGNOSIS — Z91018 Allergy to other foods: Secondary | ICD-10-CM

## 2020-08-08 DIAGNOSIS — I2089 Other forms of angina pectoris: Secondary | ICD-10-CM | POA: Diagnosis present

## 2020-08-08 DIAGNOSIS — R55 Syncope and collapse: Secondary | ICD-10-CM | POA: Diagnosis not present

## 2020-08-08 DIAGNOSIS — I5032 Chronic diastolic (congestive) heart failure: Secondary | ICD-10-CM | POA: Diagnosis not present

## 2020-08-08 DIAGNOSIS — Z8673 Personal history of transient ischemic attack (TIA), and cerebral infarction without residual deficits: Secondary | ICD-10-CM

## 2020-08-08 DIAGNOSIS — Z888 Allergy status to other drugs, medicaments and biological substances status: Secondary | ICD-10-CM

## 2020-08-08 DIAGNOSIS — I2510 Atherosclerotic heart disease of native coronary artery without angina pectoris: Secondary | ICD-10-CM

## 2020-08-08 DIAGNOSIS — M545 Low back pain, unspecified: Secondary | ICD-10-CM | POA: Diagnosis present

## 2020-08-08 DIAGNOSIS — K449 Diaphragmatic hernia without obstruction or gangrene: Secondary | ICD-10-CM

## 2020-08-08 DIAGNOSIS — Q600 Renal agenesis, unilateral: Secondary | ICD-10-CM | POA: Diagnosis not present

## 2020-08-08 DIAGNOSIS — I110 Hypertensive heart disease with heart failure: Secondary | ICD-10-CM | POA: Diagnosis not present

## 2020-08-08 DIAGNOSIS — S0990XA Unspecified injury of head, initial encounter: Secondary | ICD-10-CM | POA: Diagnosis not present

## 2020-08-08 DIAGNOSIS — R9082 White matter disease, unspecified: Secondary | ICD-10-CM | POA: Diagnosis not present

## 2020-08-08 LAB — CBC WITH DIFFERENTIAL/PLATELET
Abs Immature Granulocytes: 0.02 10*3/uL (ref 0.00–0.07)
Basophils Absolute: 0.1 10*3/uL (ref 0.0–0.1)
Basophils Relative: 1 %
Eosinophils Absolute: 0.4 10*3/uL (ref 0.0–0.5)
Eosinophils Relative: 6 %
HCT: 44.8 % (ref 39.0–52.0)
Hemoglobin: 14 g/dL (ref 13.0–17.0)
Immature Granulocytes: 0 %
Lymphocytes Relative: 24 %
Lymphs Abs: 1.6 10*3/uL (ref 0.7–4.0)
MCH: 28.8 pg (ref 26.0–34.0)
MCHC: 31.3 g/dL (ref 30.0–36.0)
MCV: 92.2 fL (ref 80.0–100.0)
Monocytes Absolute: 0.5 10*3/uL (ref 0.1–1.0)
Monocytes Relative: 7 %
Neutro Abs: 4.2 10*3/uL (ref 1.7–7.7)
Neutrophils Relative %: 62 %
Platelets: 212 10*3/uL (ref 150–400)
RBC: 4.86 MIL/uL (ref 4.22–5.81)
RDW: 14 % (ref 11.5–15.5)
WBC: 6.7 10*3/uL (ref 4.0–10.5)
nRBC: 0 % (ref 0.0–0.2)

## 2020-08-08 LAB — COMPREHENSIVE METABOLIC PANEL
ALT: 13 U/L (ref 0–44)
AST: 19 U/L (ref 15–41)
Albumin: 3.4 g/dL — ABNORMAL LOW (ref 3.5–5.0)
Alkaline Phosphatase: 72 U/L (ref 38–126)
Anion gap: 7 (ref 5–15)
BUN: 14 mg/dL (ref 8–23)
CO2: 26 mmol/L (ref 22–32)
Calcium: 9.2 mg/dL (ref 8.9–10.3)
Chloride: 103 mmol/L (ref 98–111)
Creatinine, Ser: 0.91 mg/dL (ref 0.61–1.24)
GFR, Estimated: >60 mL/min (ref >60)
Glucose, Bld: 96 mg/dL (ref 70–99)
Potassium: 3.9 mmol/L (ref 3.5–5.1)
Sodium: 136 mmol/L (ref 135–145)
Total Bilirubin: 0.6 mg/dL (ref 0.3–1.2)
Total Protein: 7.1 g/dL (ref 6.5–8.1)

## 2020-08-08 LAB — LIPASE, BLOOD: Lipase: 26 U/L (ref 11–51)

## 2020-08-08 LAB — RESP PANEL BY RT-PCR (FLU A&B, COVID) ARPGX2
Influenza A by PCR: NEGATIVE
Influenza B by PCR: NEGATIVE
SARS Coronavirus 2 by RT PCR: NEGATIVE

## 2020-08-08 LAB — I-STAT CHEM 8, ED
BUN: 16 mg/dL (ref 8–23)
Calcium, Ion: 1.22 mmol/L (ref 1.15–1.40)
Chloride: 104 mmol/L (ref 98–111)
Creatinine, Ser: 0.9 mg/dL (ref 0.61–1.24)
Glucose, Bld: 94 mg/dL (ref 70–99)
HCT: 43 % (ref 39.0–52.0)
Hemoglobin: 14.6 g/dL (ref 13.0–17.0)
Potassium: 3.8 mmol/L (ref 3.5–5.1)
Sodium: 141 mmol/L (ref 135–145)
TCO2: 27 mmol/L (ref 22–32)

## 2020-08-08 LAB — URINALYSIS, ROUTINE W REFLEX MICROSCOPIC
Bilirubin Urine: NEGATIVE
Glucose, UA: NEGATIVE mg/dL
Hgb urine dipstick: NEGATIVE
Ketones, ur: NEGATIVE mg/dL
Leukocytes,Ua: NEGATIVE
Nitrite: NEGATIVE
Protein, ur: NEGATIVE mg/dL
Specific Gravity, Urine: >1.046 — ABNORMAL HIGH (ref 1.005–1.030)
pH: 5 (ref 5.0–8.0)

## 2020-08-08 LAB — TROPONIN I (HIGH SENSITIVITY)
Troponin I (High Sensitivity): 7 ng/L (ref <18)
Troponin I (High Sensitivity): 6 ng/L (ref <18)

## 2020-08-08 LAB — CK: Total CK: 107 U/L (ref 49–397)

## 2020-08-08 MED ORDER — ASPIRIN EC 81 MG PO TBEC
81.0000 mg | DELAYED_RELEASE_TABLET | Freq: Every day | ORAL | Status: DC
  Administered 2020-08-08 – 2020-08-10 (×3): 81 mg via ORAL
  Filled 2020-08-08 (×3): qty 1

## 2020-08-08 MED ORDER — HYDRALAZINE HCL 25 MG PO TABS
25.0000 mg | ORAL_TABLET | Freq: Four times a day (QID) | ORAL | Status: DC | PRN
  Administered 2020-08-08: 25 mg via ORAL
  Filled 2020-08-08: qty 1

## 2020-08-08 MED ORDER — CARVEDILOL 12.5 MG PO TABS
12.5000 mg | ORAL_TABLET | Freq: Two times a day (BID) | ORAL | Status: DC
  Administered 2020-08-08 – 2020-08-10 (×4): 12.5 mg via ORAL
  Filled 2020-08-08 (×4): qty 1

## 2020-08-08 MED ORDER — IRBESARTAN 150 MG PO TABS
300.0000 mg | ORAL_TABLET | Freq: Every day | ORAL | Status: DC
  Administered 2020-08-08 – 2020-08-10 (×3): 300 mg via ORAL
  Filled 2020-08-08 (×2): qty 2
  Filled 2020-08-08: qty 1

## 2020-08-08 MED ORDER — ENOXAPARIN SODIUM 30 MG/0.3ML IJ SOSY
30.0000 mg | PREFILLED_SYRINGE | INTRAMUSCULAR | Status: DC
  Administered 2020-08-08 – 2020-08-09 (×2): 30 mg via SUBCUTANEOUS
  Filled 2020-08-08 (×2): qty 0.3

## 2020-08-08 MED ORDER — IOHEXOL 350 MG/ML SOLN
100.0000 mL | Freq: Once | INTRAVENOUS | Status: AC | PRN
  Administered 2020-08-08: 100 mL via INTRAVENOUS

## 2020-08-08 MED ORDER — ACETAMINOPHEN 325 MG PO TABS
325.0000 mg | ORAL_TABLET | Freq: Every day | ORAL | Status: DC
  Administered 2020-08-08 – 2020-08-10 (×2): 325 mg via ORAL
  Filled 2020-08-08 (×3): qty 1

## 2020-08-08 MED ORDER — ACETAMINOPHEN 325 MG PO TABS
650.0000 mg | ORAL_TABLET | ORAL | Status: DC | PRN
  Administered 2020-08-09: 650 mg via ORAL

## 2020-08-08 MED ORDER — ONDANSETRON HCL 4 MG/2ML IJ SOLN: 4.0000 mg | Freq: Four times a day (QID) | INTRAMUSCULAR | Status: DC | PRN

## 2020-08-08 MED ORDER — CLOPIDOGREL BISULFATE 75 MG PO TABS
75.0000 mg | ORAL_TABLET | Freq: Every day | ORAL | Status: DC
  Administered 2020-08-08 – 2020-08-10 (×3): 75 mg via ORAL
  Filled 2020-08-08 (×3): qty 1

## 2020-08-08 MED ORDER — ROSUVASTATIN CALCIUM 20 MG PO TABS
40.0000 mg | ORAL_TABLET | Freq: Every day | ORAL | Status: DC
  Administered 2020-08-08 – 2020-08-10 (×3): 40 mg via ORAL
  Filled 2020-08-08 (×3): qty 2

## 2020-08-08 NOTE — Telephone Encounter (Signed)
Pts wife is calling on his behalf to inform Dr. Burt Knack that the pt is having crushing chest pain, since 0830 this morning.  Wife states he is having crushing chest pain, mid-sternal in location. Wife states this has been continuous since 0830. Wife states he is feeling very fatigued and overall "not doing well." She said he does not appear sob while looking at him, but looks as if "doesn't feel well at all." Wife states he's not wanting to move out of the chair at all, and just sitting there. Wife reports he is able to make his needs known and in no acute distress where this warrants calling 911 right now. Wife states he has had no N/V, does not appear diaphoretic, and has had no pre-syncopal or syncopal episodes.  She has no vitals to report to me.  Pt does have known CAD and history of NSTEMI. She is asking for an appt with Dr. Burt Knack only for today.  They do not have nitro on hand. Advised the pts wife that given his history and active chest pain complaints, the pt should refer to [facility] ER now for further chest pain evaluation.  Informed the pts wife that Dr. Burt Knack is out of the office today. Advised her to call 911 or drive him now to [facility] ER.  Wife states she will drive him now to [facility] ER for further assistance, and request that I send this message to Dr. Burt Knack to make him aware of this situation.  Wife verbalized understanding and agrees with this plan. Wife was more than gracious for all the assistance provided.

## 2020-08-08 NOTE — ED Notes (Signed)
Called 6E informed pt pending approval to floor for past 40 minute. Per charge, charge to review chart for approval.

## 2020-08-08 NOTE — Consult Note (Addendum)
Cardiology Consultation:   Patient ID: Linzie Boursiquot MRN: [id number]; DOB: 1941/02/18  Admit date: 08/08/2020 Date of Consult: 08/08/2020  PCP:  Horald Pollen, [facility] Cardiologist:  Sherren Mocha, MD        Patient Profile:   Cristobal Advani is a 80 y.o. male with a hx of a history of CAD status post CABG in 2019 (LIMA-LAD SVG-D1, SVG-LCx, SVG-PDA)  who is being seen 08/08/2020 for the evaluation of chest pain at the request of Dr.  Reather Converse.  History of Present Illness:   Mr. Berenguer is a 80 year old male with a history of CAD status post CABG in 2019 (LIMA-LAD SVG-D1, SVG-LCx, SVG-PDA) who we are consulted to see by [facility] for evaluation of chest pain and possible pericardial mass.  He follows with Dr. Burt Knack, last seen on 05/26/2019.  Most recent echo 08/26/2019 showed EF 60 to 86%, grade 2 diastolic dysfunction, normal RV function, no significant valvular disease.    He reports that he has been having episodes of chest pain.  Describes as left-sided, sharp pain that occurs with exertion and resolves with rest.  Does report has had some episodes that occur at rest.  Typically last for few minutes and resolve.  Had episode this morning which prompted him to come to the ED.  In the ED, initial vital signs notable for BP 121/68, SPO2 95% on room air, pulse 74.    Has had elevated BP up to 199/107.  Labs notable for creatinine 0.9, potassium 3.9, hemoglobin 14, platelets 212, WBC 6.7, troponin 7 > 6.  EKG shows sinus rhythm, rate 59, nonspecific T wave flattening.  CTA chest/abdomen/pelvis showed no aortic pathology, moderate hiatal hernia, focal pericardial thickening versus abnormal soft tissue around the lateral wall of the left ventricle.  Past Medical History:  Diagnosis Date   Blurred vision 05/17/2016   CAD (coronary artery disease), native coronary artery 06/14/2008   a. BMS to LCx & OM 2008 b. 03/2017 CABG x 4 (LIMA to LAD, SVG to diag 1, SVG to  distal Circ, SVG to PDA)   Chest pain 04/16/2012   Diplopia    Echocardiogram    Echo 10/19: mod LVH, EF 55-60, no RWMA, Gr 1 DD, trivial AI, MAC, mod LAE, prob small post effusion   Essential hypertension 05/17/2016   Hyperlipidemia with target low density lipoprotein (LDL) cholesterol less than 70 mg/dL 06/14/2008   Qualifier: Diagnosis of  By: Mare Ferrari, RMA, [name]     Hypertension    Hypertensive heart disease 06/14/2008   Qualifier: Diagnosis of  By: Mare Ferrari, RMA, [name]     Hypertensive urgency, malignant 04/16/2012   Hypokalemia 04/14/2017   Internal hemorrhoids 04/19/2012   Ischemic stroke ([place]) 05/02/2016   Non-ST elevation (NSTEMI) myocardial infarction [facility])    pandiverticulosis 04/22/2012   12/17/2011. [place]. Juanita Craver MD. Colonoscopy. Moderate sized internal hemorrhoids and extensive pandiverticulosis. Repeat 5 years.     Pericardial effusion    Echo 11/19: mild focal basal septal hypertrophy, EF 55-60, Gr 2 DD, trivial AI, trivial TR, trivial PI, small to mod eff post to heart - no evidence of RV collapse.>> repeat limited echo in 03/2018 // Echo 03/2018: EF 55-60, mild LVH, +diastolic dysfunction, no RWMA, PASP 23, trivial pericardial effusion    Polycythemia vera(238.4) 04/17/2012   S/P CABG x 4 04/17/2017   Syncope and collapse 04/17/2012   Pt syncopized while sitting in bed giving history @ time of admission to hospital Tachycardic (appeared  sinus) to 120s-130s and hypotensive to 50s/30s.  Unresponsive initially >> Spontaneously resolved after 2-3 minutes >> return to baseline ~30 minutes    Vertigo     Past Surgical History:  Procedure Laterality Date   CORONARY ARTERY BYPASS GRAFT N/A 04/17/2017   Procedure: CORONARY ARTERY BYPASS GRAFTING (CABG) x4 , using left internal mammary artery  to LAD and right leg greater saphenous vein harvested endoscopically  to PDA, Diagonal I and Circumflex;  Surgeon: [name], MD;  Location: [facility];  Service: Open Heart  Surgery;  Laterality: N/A;   CORONARY ARTERY BYPASS GRAFT  2020   CORONARY STENT PLACEMENT     LEFT HEART CATH AND CORONARY ANGIOGRAPHY N/A 04/16/2017   Procedure: LEFT HEART CATH AND CORONARY ANGIOGRAPHY;  Surgeon: Sherren Mocha, MD;  Location: [facility];  Service: Cardiovascular;  Laterality: N/A;   TEE WITHOUT CARDIOVERSION N/A 04/17/2017   Procedure: TRANSESOPHAGEAL ECHOCARDIOGRAM (TEE);  Surgeon: [name], MD;  Location: [facility];  Service: Open Heart Surgery;  Laterality: N/A;      Inpatient Medications: Scheduled Meds:  Continuous Infusions:  PRN Meds:   Allergies:    Allergies  Allergen Reactions   Lipitor [Atorvastatin] Shortness Of Breath and Other (See Comments)    Sores non head   Morphine And Related Shortness Of Breath, Swelling and Rash    Throat swelling   Pork-Derived Products Swelling and Rash    Tongue swelling No pork products -     Social History:   Social History   Socioeconomic History   Marital status: Married    Spouse name: Not on file   Number of children: Not on file   Years of education: Not on file   Highest education level: Not on file  Occupational History   Not on file  Tobacco Use   Smoking status: Never   Smokeless tobacco: Never  Vaping Use   Vaping Use: Never used  Substance and Sexual Activity   Alcohol use: No    Alcohol/week: 0.0 standard drinks   Drug use: No   Sexual activity: Not on file  Other Topics Concern   Not on file  Social History Narrative   Lives in [place] with Wife and 2 sons.  From [place].  To [place] ~2000.     Currently retired but worked in [place] Strain: Not on [employer] Insecurity: Not on file  Transportation Needs: Not on file  Physical Activity: Not on file  Stress: Not on file  Social Connections: Not on file  Intimate Partner Violence: Not on file    Family History:    Family History  Problem  Relation Age of Onset   Heart disease Mother    Cancer Sister      ROS:  Please see the history of present illness.   All other ROS reviewed and negative.     Physical Exam/Data:   Vitals:   08/08/20 1726 08/08/20 1745 08/08/20 1815 08/08/20 1830  BP: (!) 188/117 (!) 157/126 (!) 192/133 (!) 199/107  Pulse: 72 (!) 59 82 62  Resp: 20 13 (!) 24 16  Temp: 97.9 F (36.6 C)     TempSrc: Oral     SpO2: 97% 98% 100% 96%   No intake or output data in the 24 hours ending 08/08/20 1841 Last 3 Weights 05/31/2020 05/25/2020 11/25/2019  Weight (lbs) 181 lb 176 lb 180 lb 3.2 oz  Weight (kg) 82.101 kg 79.833 kg 81.738 kg     There is no height or weight on file to calculate BMI.  General:  Well nourished, well developed, in no acute distress HEENT: normal Neck: no JVD Vascular: No carotid bruits;  Cardiac:  normal S1, S2; RRR; no murmur  Lungs:  clear to auscultation bilaterally, no wheezing, rhonchi or rales  Abd: soft, nontender, no hepatomegaly  Ext: no edema Musculoskeletal:  No deformities, BUE and BLE strength normal and equal Skin: warm and dry  Neuro:  no focal abnormalities noted Psych:  Normal affect   EKG:  The EKG was personally reviewed and demonstrates:  sinus rhythm, rate 59, nonspecific T wave flattening. Telemetry:  Telemetry was personally reviewed and demonstrates:  NSR in 60s  Relevant CV Studies:   Laboratory Data:  High Sensitivity Troponin:   Recent Labs  Lab 08/08/20 1526  TROPONINIHS 7     Chemistry Recent Labs  Lab 08/08/20 1526 08/08/20 1614  NA 136 141  K 3.9 3.8  CL 103 104  CO2 26  --   GLUCOSE 96 94  BUN 14 16  CREATININE 0.91 0.90  CALCIUM 9.2  --   GFRNONAA >60  --   ANIONGAP 7  --     Recent Labs  Lab 08/08/20 1526  PROT 7.1  ALBUMIN 3.4*  AST 19  ALT 13  ALKPHOS 72  BILITOT 0.6   Hematology Recent Labs  Lab 08/08/20 1526 08/08/20 1614  WBC 6.7  --   RBC 4.86  --   HGB 14.0 14.6  HCT 44.8 43.0  MCV 92.2  --   MCH  28.8  --   MCHC 31.3  --   RDW 14.0  --   PLT 212  --    BNPNo results for input(s): BNP, PROBNP in the last 168 hours.  DDimer No results for input(s): DDIMER in the last 168 hours.   Radiology/Studies:  DG Chest 2 View  Result Date: 08/08/2020 CLINICAL DATA:  Chest pain EXAM: CHEST - 2 VIEW COMPARISON:  05/09/2018 FINDINGS: Post sternotomy changes. No focal opacity or pleural effusion. Stable cardiomediastinal silhouette with aortic atherosclerosis. No pneumothorax. IMPRESSION: No active cardiopulmonary disease. Electronically Signed   By: Donavan Foil M.D.   On: 08/08/2020 16:07   CT Head Wo Contrast  Result Date: 08/08/2020 CLINICAL DATA:  Trauma EXAM: CT HEAD WITHOUT CONTRAST CT CERVICAL SPINE WITHOUT CONTRAST TECHNIQUE: Multidetector CT imaging of the head and cervical spine was performed following the standard protocol without intravenous contrast. Multiplanar CT image reconstructions of the cervical spine were also generated. COMPARISON:  CT brain, 11/04/2017 FINDINGS: CT HEAD FINDINGS Brain: No evidence of acute infarction, hemorrhage, hydrocephalus, extra-axial collection or mass lesion/mass effect. Periventricular and deep white matter hypodensity. Vascular: No hyperdense vessel or unexpected calcification. Skull: Normal. Negative for fracture or focal lesion. Sinuses/Orbits: Opacification of the right frontal sinus. Partial opacification of the ethmoid air cells. Polyps and/or mucous retention cysts of the right nasal cavity (series 4, image 25). Other: None. CT CERVICAL SPINE FINDINGS Alignment: Normal. Skull base and vertebrae: No acute fracture. No primary bone lesion or focal pathologic process. Soft tissues and spinal canal: No prevertebral fluid or swelling. No visible canal hematoma. Disc levels: Mild to moderate disc space height loss and osteophytosis, worst at C6-C7. Upper chest: Negative. Other: None. IMPRESSION: 1. No acute intracranial pathology. 2. Small-vessel white matter  disease. 3. Opacification of the right frontal sinus. Partial opacification of the ethmoid air cells.  Polyps and/or mucous retention cysts of the right nasal cavity. Correlate for paranasal sinus disease. 4. No fracture or static subluxation of the cervical spine. 5. Mild to moderate multilevel cervical disc degenerative disease. Electronically Signed   By: Eddie Candle M.D.   On: 08/08/2020 17:02   CT Cervical Spine Wo Contrast  Result Date: 08/08/2020 CLINICAL DATA:  Trauma EXAM: CT HEAD WITHOUT CONTRAST CT CERVICAL SPINE WITHOUT CONTRAST TECHNIQUE: Multidetector CT imaging of the head and cervical spine was performed following the standard protocol without intravenous contrast. Multiplanar CT image reconstructions of the cervical spine were also generated. COMPARISON:  CT brain, 11/04/2017 FINDINGS: CT HEAD FINDINGS Brain: No evidence of acute infarction, hemorrhage, hydrocephalus, extra-axial collection or mass lesion/mass effect. Periventricular and deep white matter hypodensity. Vascular: No hyperdense vessel or unexpected calcification. Skull: Normal. Negative for fracture or focal lesion. Sinuses/Orbits: Opacification of the right frontal sinus. Partial opacification of the ethmoid air cells. Polyps and/or mucous retention cysts of the right nasal cavity (series 4, image 25). Other: None. CT CERVICAL SPINE FINDINGS Alignment: Normal. Skull base and vertebrae: No acute fracture. No primary bone lesion or focal pathologic process. Soft tissues and spinal canal: No prevertebral fluid or swelling. No visible canal hematoma. Disc levels: Mild to moderate disc space height loss and osteophytosis, worst at C6-C7. Upper chest: Negative. Other: None. IMPRESSION: 1. No acute intracranial pathology. 2. Small-vessel white matter disease. 3. Opacification of the right frontal sinus. Partial opacification of the ethmoid air cells. Polyps and/or mucous retention cysts of the right nasal cavity. Correlate for paranasal  sinus disease. 4. No fracture or static subluxation of the cervical spine. 5. Mild to moderate multilevel cervical disc degenerative disease. Electronically Signed   By: Eddie Candle M.D.   On: 08/08/2020 17:02   CT Angio Chest/Abd/Pel for Dissection W and/or Wo Contrast  Result Date: 08/08/2020 CLINICAL DATA:  Low back and leg pain, aortic dissection suspected EXAM: CT ANGIOGRAPHY CHEST, ABDOMEN AND PELVIS TECHNIQUE: Non-contrast CT of the chest was initially obtained. Multidetector CT imaging through the chest, abdomen and pelvis was performed using the standard protocol during bolus administration of intravenous contrast. Multiplanar reconstructed images and MIPs were obtained and reviewed to evaluate the vascular anatomy. CONTRAST:  12m OMNIPAQUE IOHEXOL 350 MG/ML SOLN COMPARISON:  None. FINDINGS: CTA CHEST FINDINGS Cardiovascular: Preferential opacification of the thoracic aorta. Normal contour and caliber of the thoracic aorta. No evidence of aneurysm, dissection, or other acute aortic syndrome. Moderate mixed calcific atherosclerosis. Normal heart size. Three-vessel coronary artery calcifications status post CABG. There is an unusual, focal thickening and or abnormal soft tissue about the lateral wall of the left ventricle, measuring approximately 8.1 x 2.2 cm (series 6, image 102). Mediastinum/Nodes: No enlarged mediastinal, hilar, or axillary lymph nodes. Moderate hiatal hernia with intrathoracic position of the gastric fundus. Thyroid gland, trachea, and esophagus demonstrate no significant findings. Lungs/Pleura: Minimal dependent bibasilar atelectasis or scarring. No pleural effusion or pneumothorax. Musculoskeletal: No chest wall abnormality. No acute or significant osseous findings. Review of the MIP images confirms the above findings. CTA ABDOMEN AND PELVIS FINDINGS VASCULAR Normal contour and caliber of the abdominal aorta. No evidence of aneurysm, dissection, or other acute aortic pathology.  There is standard branching pattern of the abdominal aorta, with solitary bilateral renal arteries. Review of the MIP images confirms the above findings. NON-VASCULAR Hepatobiliary: No solid liver abnormality is seen. No gallstones, gallbladder wall thickening, or biliary dilatation. Pancreas: Unremarkable. No pancreatic ductal dilatation or surrounding inflammatory changes. Spleen:  Normal in size without significant abnormality. Adrenals/Urinary Tract: Adrenal glands are unremarkable. Kidneys are normal, without renal calculi, solid lesion, or hydronephrosis. Bladder is unremarkable. Stomach/Bowel: Stomach is within normal limits. Appendix appears normal. No evidence of bowel wall thickening, distention, or inflammatory changes. Descending and sigmoid diverticulosis. Lymphatic: No enlarged abdominal or pelvic lymph nodes. Reproductive: Prostatomegaly. Other: No abdominal wall hernia or abnormality. No abdominopelvic ascites. Musculoskeletal: No acute or significant osseous findings. Review of the MIP images confirms the above findings. IMPRESSION: 1. Normal contour and caliber of the thoracic and abdominal aorta. No evidence of aneurysm, dissection, or other acute aortic pathology. Moderate aortic atherosclerosis. 2. There is an unusual, focal pericardial thickening and/or abnormal soft tissue about the lateral wall of the left ventricle, measuring approximately 8.1 x 2.2 cm. This is of uncertain nature, slightly increased compared to prior examination dated 2019, at which time it was described as pericardial effusion, however it is clearly soft tissue attenuation on current examination and not consistent with effusion. Cardiac MRI performed on a nonemergent basis may be helpful to more clearly assess nature and etiology of this finding. 3. Moderate hiatal hernia with intrathoracic position of the gastric fundus. 4. Coronary artery disease. 5. Diverticulosis without evidence of diverticulitis. 6. Prostatomegaly.  Electronically Signed   By: Eddie Candle M.D.   On: 08/08/2020 17:10     Assessment and Plan:   Chest pain: Somewhat atypical as describes left-sided sharp pain, but does appear to occur with exertion and resolves with rest.  Troponins negative.  Not pleuritic or positional to suggest pericarditis.  Given his CAD history with CABG in 2019, will plan Lexiscan Myoview for further evaluation.  Check echocardiogram.  Possible pericardial mass: CTA chest showed focal pericardial thickening versus soft tissue adjacent to the lateral wall of the LV.  Elevated Hounsfield units (50-60s) suggest soft tissue density, though could represent a loculated exudative effusion.  Will plan cardiac MRI for further evaluation.     For questions or updates, please contact [place] Please consult [email or web address] for contact info under    Signed, Donato Heinz, MD  08/08/2020 6:41 PM

## 2020-08-08 NOTE — ED Provider Notes (Signed)
[facility] EMERGENCY DEPARTMENT Provider Note   CSN: [id number] Arrival date & time: 08/08/20  1215     History Chief Complaint  Patient presents with   Back Pain    [name] is a 80 y.o. male.  Patient with history of coronary artery disease, high blood pressure, NSTEMI, pericardial effusion, CABG, ischemic stroke presents with worsening anterior nonradiating chest pain, lower back pain and left leg pain.  Patient's had the left leg pain for over a year, gradually worsening.  No new injuries.  No significant swelling.  Patient does feel weaker in the leg however also has pain with it.  Patient had a few episodes of confusion per family.  Pain to walk on it.  Patient's had chronic back pain worse with movement position, back surgery in the past.  This is similar to previous.  Patient's had intermittent chest pain worse with exertion and in the heat.  No fevers chills or cough.  Patient follows with Dr. Burt Knack.      Past Medical History:  Diagnosis Date   Blurred vision 05/17/2016   CAD (coronary artery disease), native coronary artery 06/14/2008   a. BMS to LCx & OM 2008 b. 03/2017 CABG x 4 (LIMA to LAD, SVG to diag 1, SVG to distal Circ, SVG to PDA)   Chest pain 04/16/2012   Diplopia    Echocardiogram    Echo 10/19: mod LVH, EF 55-60, no RWMA, Gr 1 DD, trivial AI, MAC, mod LAE, prob small post effusion   Essential hypertension 05/17/2016   Hyperlipidemia with target low density lipoprotein (LDL) cholesterol less than 70 mg/dL 06/14/2008   Qualifier: Diagnosis of  By: Mare Ferrari, RMA, [name]     Hypertension    Hypertensive heart disease 06/14/2008   Qualifier: Diagnosis of  By: Mare Ferrari, RMA, [name]     Hypertensive urgency, malignant 04/16/2012   Hypokalemia 04/14/2017   Internal hemorrhoids 04/19/2012   Ischemic stroke ([place]) 05/02/2016   Non-ST elevation (NSTEMI) myocardial infarction [facility])    pandiverticulosis 04/22/2012   12/17/2011. [place].  Juanita Craver MD. Colonoscopy. Moderate sized internal hemorrhoids and extensive pandiverticulosis. Repeat 5 years.     Pericardial effusion    Echo 11/19: mild focal basal septal hypertrophy, EF 55-60, Gr 2 DD, trivial AI, trivial TR, trivial PI, small to mod eff post to heart - no evidence of RV collapse.>> repeat limited echo in 03/2018 // Echo 03/2018: EF 55-60, mild LVH, +diastolic dysfunction, no RWMA, PASP 23, trivial pericardial effusion    Polycythemia vera(238.4) 04/17/2012   S/P CABG x 4 04/17/2017   Syncope and collapse 04/17/2012   Pt syncopized while sitting in bed giving history @ time of admission to hospital Tachycardic (appeared sinus) to 120s-130s and hypotensive to 50s/30s.  Unresponsive initially >> Spontaneously resolved after 2-3 minutes >> return to baseline ~30 minutes    Vertigo     Patient Active Problem List   Diagnosis Date Noted   Type 2 diabetes mellitus with other specified complication, without long-term current use of insulin ([place]) 05/31/2020   Aortic atherosclerosis ([place]) 05/31/2020   Acute on chronic diastolic heart failure ([place]) 02/14/2018   Pericardial effusion    Abnormal CT of the chest 11/05/2017   Non-ST elevation (NSTEMI) myocardial infarction [facility])    Essential hypertension 05/17/2016   Ischemic stroke ([place]) 05/02/2016   pandiverticulosis 04/22/2012   Internal hemorrhoids 04/19/2012   Hyperlipidemia with target low density lipoprotein (LDL) cholesterol less than 70 mg/dL 06/14/2008  Hypertensive heart disease 06/14/2008   Coronary artery disease of native heart with stable angina pectoris, unspecified vessel or lesion type ([place]) 06/14/2008    Past Surgical History:  Procedure Laterality Date   CORONARY ARTERY BYPASS GRAFT N/A 04/17/2017   Procedure: CORONARY ARTERY BYPASS GRAFTING (CABG) x4 , using left internal mammary artery  to LAD and right leg greater saphenous vein harvested endoscopically  to PDA, Diagonal I and Circumflex;  Surgeon: [name], MD;  Location: [place];  Service: Open Heart Surgery;  Laterality: N/A;   CORONARY ARTERY BYPASS GRAFT  2020   CORONARY STENT PLACEMENT     LEFT HEART CATH AND CORONARY ANGIOGRAPHY N/A 04/16/2017   Procedure: LEFT HEART CATH AND CORONARY ANGIOGRAPHY;  Surgeon: Sherren Mocha, MD;  Location: [place] CV LAB;  Service: Cardiovascular;  Laterality: N/A;   TEE WITHOUT CARDIOVERSION N/A 04/17/2017   Procedure: TRANSESOPHAGEAL ECHOCARDIOGRAM (TEE);  Surgeon: [name], MD;  Location: [place];  Service: Open Heart Surgery;  Laterality: N/A;       Family History  Problem Relation Age of Onset   Heart disease Mother    Cancer Sister     Social History   Tobacco Use   Smoking status: Never   Smokeless tobacco: Never  Vaping Use   Vaping Use: Never used  Substance Use Topics   Alcohol use: No    Alcohol/week: 0.0 standard drinks   Drug use: No    Home Medications Prior to Admission medications   Medication Sig Start Date End Date Taking? Authorizing Provider  acetaminophen (TYLENOL) 325 MG tablet Take 325 mg by mouth daily.    [provider]  aspirin EC 81 MG tablet Take 81 mg by mouth daily.    [provider]  carvedilol (COREG) 12.5 MG tablet Take 1 tablet (12.5 mg total) by mouth 2 (two) times daily. 07/27/20 07/22/21  Sherren Mocha, MD  clopidogrel (PLAVIX) 75 MG tablet Take 1 tablet (75 mg total) by mouth daily. 04/07/20 04/02/21  Leanor Kail, PA  furosemide (LASIX) 40 MG tablet Take 1 tablet (40 mg total) by mouth every other day. 04/07/20 04/02/21  Bhagat, Crista Luria, PA  potassium chloride SA (KLOR-CON) 20 MEQ tablet Take 1 tablet (20 mEq total) by mouth every other day. 04/07/20 04/02/21  Leanor Kail, PA  rosuvastatin (CRESTOR) 40 MG tablet Take 1 tablet (40 mg total) by mouth daily. 04/07/20 04/02/21  Bhagat, Crista Luria, PA  valsartan (DIOVAN) 320 MG tablet Take 1 tablet (320 mg total) by mouth daily. 04/07/20 04/02/21  Leanor Kail, PA    Allergies    Lipitor [atorvastatin], Morphine and related, and Pork-derived products  Review of Systems   Review of Systems  Constitutional:  Negative for chills and fever.  HENT:  Negative for congestion.   Eyes:  Negative for visual disturbance.  Respiratory:  Negative for shortness of breath.   Cardiovascular:  Positive for chest pain. Negative for leg swelling.  Gastrointestinal:  Negative for abdominal pain and vomiting.  Genitourinary:  Negative for dysuria and flank pain.  Musculoskeletal:  Positive for back pain. Negative for neck pain and neck stiffness.  Skin:  Negative for rash.  Neurological:  Negative for light-headedness and headaches.   Physical Exam Updated Vital Signs BP (!) 199/107   Pulse 62   Temp 97.9 F (36.6 C) (Oral)   Resp 16   SpO2 96%   Physical Exam Vitals and nursing note reviewed.  Constitutional:      General:  He is not in acute distress.    Appearance: He is well-developed.  HENT:     Head: Normocephalic and atraumatic.     Mouth/Throat:     Mouth: Mucous membranes are dry.  Eyes:     General:        Right eye: No discharge.        Left eye: No discharge.     Conjunctiva/sclera: Conjunctivae normal.  Neck:     Trachea: No tracheal deviation.  Cardiovascular:     Rate and Rhythm: Normal rate and regular rhythm.     Heart sounds: No murmur heard. Pulmonary:     Effort: Pulmonary effort is normal.     Breath sounds: Normal breath sounds.  Abdominal:     General: There is no distension.     Palpations: Abdomen is soft.     Tenderness: There is no abdominal tenderness. There is no guarding.  Musculoskeletal:        General: Tenderness present. No swelling.     Cervical back: Normal range of motion and neck supple. No rigidity.     Comments: Patient has mild paraspinal lumbar tenderness no significant midline tenderness.  Patient has mild tenderness with movement and palpation anterior tibia.  Patient has 5+  strength upper and lower extremities bilateral subtle weakness left leg however also has pain with flexion.  Skin:    General: Skin is warm.     Capillary Refill: Capillary refill takes less than 2 seconds.     Findings: No rash.  Neurological:     General: No focal deficit present.     Mental Status: He is alert.     Cranial Nerves: No cranial nerve deficit.     Comments: Patient has equal strength overall bilateral, mild pain related decreased left lower extremity.  Sensation intact all extremities.  Psychiatric:        Mood and Affect: Mood normal.    ED Results / Procedures / Treatments   Labs (all labs ordered are listed, but only abnormal results are displayed) Labs Reviewed  COMPREHENSIVE METABOLIC PANEL - Abnormal; Notable for the following components:      Result Value   Albumin 3.4 (*)    All other components within normal limits  RESP PANEL BY RT-PCR (FLU A&B, COVID) ARPGX2  CBC WITH DIFFERENTIAL/PLATELET  LIPASE, BLOOD  CK  URINALYSIS, ROUTINE W REFLEX MICROSCOPIC  I-STAT CHEM 8, ED  TROPONIN I (HIGH SENSITIVITY)  TROPONIN I (HIGH SENSITIVITY)    EKG EKG Interpretation  Date/Time:  Monday August 08 2020 15:37:56 EDT Ventricular Rate:  59 PR Interval:  156 QRS Duration: 72 QT Interval:  410 QTC Calculation: 405 R Axis:   14 Text Interpretation: Sinus bradycardia Cannot rule out Anterior infarct , age undetermined Abnormal ECG Confirmed by Elnora Morrison [phone]) on 08/08/2020 6:35:27 PM  Radiology DG Chest 2 View  Result Date: 08/08/2020 CLINICAL DATA:  Chest pain EXAM: CHEST - 2 VIEW COMPARISON:  05/09/2018 FINDINGS: Post sternotomy changes. No focal opacity or pleural effusion. Stable cardiomediastinal silhouette with aortic atherosclerosis. No pneumothorax. IMPRESSION: No active cardiopulmonary disease. Electronically Signed   By: Donavan Foil M.D.   On: 08/08/2020 16:07   CT Head Wo Contrast  Result Date: 08/08/2020 CLINICAL DATA:  Trauma EXAM: CT HEAD  WITHOUT CONTRAST CT CERVICAL SPINE WITHOUT CONTRAST TECHNIQUE: Multidetector CT imaging of the head and cervical spine was performed following the standard protocol without intravenous contrast. Multiplanar CT image reconstructions of the cervical spine were  also generated. COMPARISON:  CT brain, 11/04/2017 FINDINGS: CT HEAD FINDINGS Brain: No evidence of acute infarction, hemorrhage, hydrocephalus, extra-axial collection or mass lesion/mass effect. Periventricular and deep white matter hypodensity. Vascular: No hyperdense vessel or unexpected calcification. Skull: Normal. Negative for fracture or focal lesion. Sinuses/Orbits: Opacification of the right frontal sinus. Partial opacification of the ethmoid air cells. Polyps and/or mucous retention cysts of the right nasal cavity (series 4, image 25). Other: None. CT CERVICAL SPINE FINDINGS Alignment: Normal. Skull base and vertebrae: No acute fracture. No primary bone lesion or focal pathologic process. Soft tissues and spinal canal: No prevertebral fluid or swelling. No visible canal hematoma. Disc levels: Mild to moderate disc space height loss and osteophytosis, worst at C6-C7. Upper chest: Negative. Other: None. IMPRESSION: 1. No acute intracranial pathology. 2. Small-vessel white matter disease. 3. Opacification of the right frontal sinus. Partial opacification of the ethmoid air cells. Polyps and/or mucous retention cysts of the right nasal cavity. Correlate for paranasal sinus disease. 4. No fracture or static subluxation of the cervical spine. 5. Mild to moderate multilevel cervical disc degenerative disease. Electronically Signed   By: Eddie Candle M.D.   On: 08/08/2020 17:02   CT Cervical Spine Wo Contrast  Result Date: 08/08/2020 CLINICAL DATA:  Trauma EXAM: CT HEAD WITHOUT CONTRAST CT CERVICAL SPINE WITHOUT CONTRAST TECHNIQUE: Multidetector CT imaging of the head and cervical spine was performed following the standard protocol without intravenous  contrast. Multiplanar CT image reconstructions of the cervical spine were also generated. COMPARISON:  CT brain, 11/04/2017 FINDINGS: CT HEAD FINDINGS Brain: No evidence of acute infarction, hemorrhage, hydrocephalus, extra-axial collection or mass lesion/mass effect. Periventricular and deep white matter hypodensity. Vascular: No hyperdense vessel or unexpected calcification. Skull: Normal. Negative for fracture or focal lesion. Sinuses/Orbits: Opacification of the right frontal sinus. Partial opacification of the ethmoid air cells. Polyps and/or mucous retention cysts of the right nasal cavity (series 4, image 25). Other: None. CT CERVICAL SPINE FINDINGS Alignment: Normal. Skull base and vertebrae: No acute fracture. No primary bone lesion or focal pathologic process. Soft tissues and spinal canal: No prevertebral fluid or swelling. No visible canal hematoma. Disc levels: Mild to moderate disc space height loss and osteophytosis, worst at C6-C7. Upper chest: Negative. Other: None. IMPRESSION: 1. No acute intracranial pathology. 2. Small-vessel white matter disease. 3. Opacification of the right frontal sinus. Partial opacification of the ethmoid air cells. Polyps and/or mucous retention cysts of the right nasal cavity. Correlate for paranasal sinus disease. 4. No fracture or static subluxation of the cervical spine. 5. Mild to moderate multilevel cervical disc degenerative disease. Electronically Signed   By: Eddie Candle M.D.   On: 08/08/2020 17:02   CT Angio Chest/Abd/Pel for Dissection W and/or Wo Contrast  Result Date: 08/08/2020 CLINICAL DATA:  Low back and leg pain, aortic dissection suspected EXAM: CT ANGIOGRAPHY CHEST, ABDOMEN AND PELVIS TECHNIQUE: Non-contrast CT of the chest was initially obtained. Multidetector CT imaging through the chest, abdomen and pelvis was performed using the standard protocol during bolus administration of intravenous contrast. Multiplanar reconstructed images and MIPs were  obtained and reviewed to evaluate the vascular anatomy. CONTRAST:  164m OMNIPAQUE IOHEXOL 350 MG/ML SOLN COMPARISON:  None. FINDINGS: CTA CHEST FINDINGS Cardiovascular: Preferential opacification of the thoracic aorta. Normal contour and caliber of the thoracic aorta. No evidence of aneurysm, dissection, or other acute aortic syndrome. Moderate mixed calcific atherosclerosis. Normal heart size. Three-vessel coronary artery calcifications status post CABG. There is an unusual, focal thickening and or  abnormal soft tissue about the lateral wall of the left ventricle, measuring approximately 8.1 x 2.2 cm (series 6, image 102). Mediastinum/Nodes: No enlarged mediastinal, hilar, or axillary lymph nodes. Moderate hiatal hernia with intrathoracic position of the gastric fundus. Thyroid gland, trachea, and esophagus demonstrate no significant findings. Lungs/Pleura: Minimal dependent bibasilar atelectasis or scarring. No pleural effusion or pneumothorax. Musculoskeletal: No chest wall abnormality. No acute or significant osseous findings. Review of the MIP images confirms the above findings. CTA ABDOMEN AND PELVIS FINDINGS VASCULAR Normal contour and caliber of the abdominal aorta. No evidence of aneurysm, dissection, or other acute aortic pathology. There is standard branching pattern of the abdominal aorta, with solitary bilateral renal arteries. Review of the MIP images confirms the above findings. NON-VASCULAR Hepatobiliary: No solid liver abnormality is seen. No gallstones, gallbladder wall thickening, or biliary dilatation. Pancreas: Unremarkable. No pancreatic ductal dilatation or surrounding inflammatory changes. Spleen: Normal in size without significant abnormality. Adrenals/Urinary Tract: Adrenal glands are unremarkable. Kidneys are normal, without renal calculi, solid lesion, or hydronephrosis. Bladder is unremarkable. Stomach/Bowel: Stomach is within normal limits. Appendix appears normal. No evidence of bowel  wall thickening, distention, or inflammatory changes. Descending and sigmoid diverticulosis. Lymphatic: No enlarged abdominal or pelvic lymph nodes. Reproductive: Prostatomegaly. Other: No abdominal wall hernia or abnormality. No abdominopelvic ascites. Musculoskeletal: No acute or significant osseous findings. Review of the MIP images confirms the above findings. IMPRESSION: 1. Normal contour and caliber of the thoracic and abdominal aorta. No evidence of aneurysm, dissection, or other acute aortic pathology. Moderate aortic atherosclerosis. 2. There is an unusual, focal pericardial thickening and/or abnormal soft tissue about the lateral wall of the left ventricle, measuring approximately 8.1 x 2.2 cm. This is of uncertain nature, slightly increased compared to prior examination dated 2019, at which time it was described as pericardial effusion, however it is clearly soft tissue attenuation on current examination and not consistent with effusion. Cardiac MRI performed on a nonemergent basis may be helpful to more clearly assess nature and etiology of this finding. 3. Moderate hiatal hernia with intrathoracic position of the gastric fundus. 4. Coronary artery disease. 5. Diverticulosis without evidence of diverticulitis. 6. Prostatomegaly. Electronically Signed   By: Eddie Candle M.D.   On: 08/08/2020 17:10    Procedures Procedures   Medications Ordered in ED Medications  iohexol (OMNIPAQUE) 350 MG/ML injection 100 mL (100 mLs Intravenous Contrast Given 08/08/20 1639)    ED Course  I have reviewed the triage vital signs and the nursing notes.  Pertinent labs & imaging results that were available during my care of the patient were reviewed by me and considered in my medical decision making (see chart for details).  Clinical Course as of 08/08/20 1836  Mon Aug 08, 2020  1610 Called [employer] to ask them to run the [employer] [EH]    Clinical Course User Index [EH] Ollen Gross   MDM  Rules/Calculators/A&P                         Patient presents to the emergency room with known significant heart disease and local follow-up with Dr. Burt Knack however has had worsening episodes of chest pain with some exertional component.  Patient had general cardiac/ACS evaluation with negative initial troponin, EKG no acute ST elevation reviewed.  Patient currently has no significant pain in the chest.  Patient's back and leg pain are more chronic in nature, has distal pulses intact, no focal weakness on  exam.  Patient CT head which she has no acute findings reviewed.  Patient's other general blood work unremarkable no significant anemia, normal electrolytes and kidney function unremarkable.  CT scan results for possible dissection reviewed no signs of dissection or significant aneurysm, coronary artery disease visualized, large hernia which may be contributing to his pain and also has new 2 x 8 cm thickening/mass left ventricle.  Discussed with cardiology Dr. Radford Pax will see patient in consult.  Paged hospitalist for admission.  Updated family on results and plan of care. Final Clinical Impression(s) / ED Diagnoses Final diagnoses:  Acute chest pain  Hiatal hernia  Left leg pain    Rx / DC Orders ED Discharge Orders     None        Elnora Morrison, MD 08/08/20 [phone]

## 2020-08-08 NOTE — ED Notes (Signed)
Per pt request family member Hennie Duos, grandson updated regarding admission, plan of care, and room assignment

## 2020-08-08 NOTE — ED Triage Notes (Signed)
Pt and family member reports pt having lower back pain and leg pain. No specfic injury but reports unable to walk due to pain. Tearful at triage.

## 2020-08-08 NOTE — ED Provider Notes (Signed)
Emergency Medicine Provider Triage Evaluation Note  [name] , a 80 y.o. male  was evaluated in triage.  Pt complains of lower back pain, chest pain, and leg pain.  He states that he is unable to walk due to the pain in his legs and feeling weak.  He has frequently been feeling dizzy recently. Chart review shows that family talked with cardiology and reports that since 830 this morning he has been having crushing substernal chest pain.  Patient tells me that his back hurts him more than his chest.  Chart review shows that he has not had any imaging recently of his abdomen.  He has had multiple falls recently.  He denies specifically striking his head.  Review of Systems  Positive: Chest pain, low back pain, bilateral leg pain and subjective weakness Negative: Fevers, vomiting  Physical Exam  BP (!) 145/82 (BP Location: Left Arm)   Pulse 63   Temp 97.7 F (36.5 C) (Oral)   Resp 18   SpO2 98%  Gen:   Awake, appears uncomfortable and tearful Resp:  Normal effort  MSK:   Difficulty moving BLE however is able to move them.   Other:  1+ PT pulses bilaterally.   Medical Decision Making  Medically screening exam initiated at 3:30 PM.  Appropriate orders placed.  Olon Russ was informed that the remainder of the evaluation will be completed by another provider, this initial triage assessment does not replace that evaluation, and the importance of remaining in the ED until their evaluation is complete.  Patient appears to be a usually very stoic gentleman who appears to be in significant pain with leg weakness and chest pain. He denies any recent fevers or illness.  I am able to palpate DP pulses bilaterally and he is able to move his legs. Given the degree of his symptoms and him not having any recent imaging that would include both chest and abdominal aorta with his combination of chest and back pain possibility of dissection must be considered. IV was established in triage.  Labs,  EKG, chest x-ray and dissection study is ordered. Additionally given that he is fallen multiple times and is now having weakness and appears to be feeling unwell will obtain CT scan of head and neck.    Ollen Gross 08/08/20 2239    Charlesetta Shanks, MD 08/17/20 [phone]

## 2020-08-08 NOTE — H&P (Signed)
History and Physical    [name] OXB:[id number] DOB: 1940-06-05 DOA: 08/08/2020  PCP: Gerald Pollen, MD (Confirm with patient/family/NH records and if not entered, this has to be entered at [facility] point of entry) Patient coming from: Home  I have personally briefly reviewed patient's old medical records in [place]  Chief Complaint: Chest pain  HPI: [name] is a 80 y.o. male with medical history significant of CAD with CABG, HTN, HLD, presented with new onset of chest pains.  Patient that has on normal chest pains for few months, usually related to exertion.  But this morning patient was doing some gardening work more than he usually does, and started to feel pressure-like chest pain in the middle of the chest, radiating to the left side of the chest, associated with shortness of breath, he immediately lie down on the ground and chest pain eased away in a few minutes.  Cardiology patient has dizziness positional, frequently bent down to reach something on the floor at work [employer] feel dizzy sometimes he would lose his footing and fell.  And family also reported patient has had unsteady gait this year.  Patient went to see his cardiology 31-monthago who decreased his BP meds concerning for orthostasis.  However family report patient continued to experience frequent episodes of feeling lightheaded when bending down himself or leaning forward.  Patient also complaining about worsening of pain on left shin area denies any calf pain, no ankle/knee swelling or rash.  ED Course: Blood pressure significantly elevated.  Troponin negative x2, EKG negative for acute ST changes, CT angiogram negative for dissection, but showed a left ventricle mass  Review of Systems: As per HPI otherwise 14 point review of systems negative.    Past Medical History:  Diagnosis Date   Blurred vision 05/17/2016   CAD (coronary artery disease), native coronary artery 06/14/2008   a. BMS  to LCx & OM 2008 b. 03/2017 CABG x 4 (LIMA to LAD, SVG to diag 1, SVG to distal Circ, SVG to PDA)   Chest pain 04/16/2012   Diplopia    Echocardiogram    Echo 10/19: mod LVH, EF 55-60, no RWMA, Gr 1 DD, trivial AI, MAC, mod LAE, prob small post effusion   Essential hypertension 05/17/2016   Hyperlipidemia with target low density lipoprotein (LDL) cholesterol less than 70 mg/dL 06/14/2008   Qualifier: Diagnosis of  By: FMare Ferrari RMA, [name]     Hypertension    Hypertensive heart disease 06/14/2008   Qualifier: Diagnosis of  By: FMare Ferrari RMA, [name]     Hypertensive urgency, malignant 04/16/2012   Hypokalemia 04/14/2017   Internal hemorrhoids 04/19/2012   [place]schemic stroke [place] 05/02/2016   Non-ST elevation (NSTEMI) myocardial infarction ([facility]    pandiverticulosis 04/22/2012   [place] JJuanita Craver[place]. Colonoscopy. Moderate sized internal hemorrhoids and extensive pandiverticulosis. Repeat 5 years.     Pericardial effusion    Echo 11/19: mild focal basal septal hypertrophy, EF 55-60, Gr 2 DD, trivial AI, trivial TR, trivial PI, small to mod eff post to heart - no evidence of RV collapse.>> repeat limited echo in 03/2018 // Echo 03/2018: EF 55-60, mild LVH, +diastolic dysfunction, no RWMA, PASP 23, trivial pericardial effusion    Polycythemia vera(238.4) 04/17/2012   S/P CABG x 4 04/17/2017   Syncope and collapse 04/17/2012   Pt syncopized while sitting in bed giving history @ time of admission to hospital Tachycardic (appeared sinus) to 120s-130s and hypotensive to 50s/30s.  Unresponsive initially >> Spontaneously resolved after 2-3 minutes >> return to baseline ~30 minutes    Vertigo     Past Surgical History:  Procedure Laterality Date   CORONARY ARTERY BYPASS GRAFT N/A 04/17/2017   Procedure: CORONARY ARTERY BYPASS GRAFTING (CABG) x4 , using left internal mammary artery  to LAD and right leg greater saphenous vein harvested endoscopically  to PDA, Diagonal I and Circumflex;   Surgeon: [name], MD;  Location: [facility];  Service: Open Heart Surgery;  Laterality: N/A;   CORONARY ARTERY BYPASS GRAFT  2020   CORONARY STENT PLACEMENT     LEFT HEART CATH AND CORONARY ANGIOGRAPHY N/A 04/16/2017   Procedure: LEFT HEART CATH AND CORONARY ANGIOGRAPHY;  Surgeon: Sherren Mocha, MD;  Location: [facility];  Service: Cardiovascular;  Laterality: N/A;   TEE WITHOUT CARDIOVERSION N/A 04/17/2017   Procedure: TRANSESOPHAGEAL ECHOCARDIOGRAM (TEE);  Surgeon: [name], MD;  Location: [facility];  Service: Open Heart Surgery;  Laterality: N/A;     reports that he has never smoked. He has never used smokeless tobacco. He reports that he does not drink alcohol and does not use drugs.  Allergies  Allergen Reactions   Lipitor [Atorvastatin] Shortness Of Breath and Other (See Comments)    Sores non head   Morphine And Related Shortness Of Breath, Swelling and Rash    Throat swelling   Pork-Derived Products Swelling and Rash    Tongue swelling No pork products -     Family History  Problem Relation Age of Onset   Heart disease Mother    Cancer Sister      Prior to Admission medications   Medication Sig Start Date End Date Taking? Authorizing Provider  acetaminophen (TYLENOL) 325 MG tablet Take 325 mg by mouth daily.    [provider]  aspirin EC 81 MG tablet Take 81 mg by mouth daily.    [provider]  carvedilol (COREG) 12.5 MG tablet Take 1 tablet (12.5 mg total) by mouth 2 (two) times daily. 07/27/20 07/22/21  Sherren Mocha, MD  clopidogrel (PLAVIX) 75 MG tablet Take 1 tablet (75 mg total) by mouth daily. 04/07/20 04/02/21  Leanor Kail, PA  furosemide (LASIX) 40 MG tablet Take 1 tablet (40 mg total) by mouth every other day. 04/07/20 04/02/21  Bhagat, Crista Luria, PA  potassium chloride SA (KLOR-CON) 20 MEQ tablet Take 1 tablet (20 mEq total) by mouth every other day. 04/07/20 04/02/21  Leanor Kail, PA  rosuvastatin (CRESTOR) 40  MG tablet Take 1 tablet (40 mg total) by mouth daily. 04/07/20 04/02/21  Bhagat, Crista Luria, PA  valsartan (DIOVAN) 320 MG tablet Take 1 tablet (320 mg total) by mouth daily. 04/07/20 04/02/21  Leanor Kail, PA    Physical Exam: Vitals:   08/08/20 1726 08/08/20 1745 08/08/20 1815 08/08/20 1830  BP: (!) 188/117 (!) 157/126 (!) 192/133 (!) 199/107  Pulse: 72 (!) 59 82 62  Resp: 20 13 (!) 24 16  Temp: 97.9 F (36.6 C)     TempSrc: Oral     SpO2: 97% 98% 100% 96%    Constitutional: NAD, calm, comfortable Vitals:   08/08/20 1726 08/08/20 1745 08/08/20 1815 08/08/20 1830  BP: (!) 188/117 (!) 157/126 (!) 192/133 (!) 199/107  Pulse: 72 (!) 59 82 62  Resp: 20 13 (!) 24 16  Temp: 97.9 F (36.6 C)     TempSrc: Oral     SpO2: 97% 98% 100% 96%   Eyes: PERRL, lids and conjunctivae normal  ENMT: Mucous membranes are moist. Posterior pharynx clear of any exudate or lesions.Normal dentition.  Neck: normal, supple, no masses, no thyromegaly Respiratory: clear to auscultation bilaterally, no wheezing, no crackles. Normal respiratory effort. No accessory muscle use.  Cardiovascular: Regular rate and rhythm, no murmurs / rubs / gallops. No extremity edema. 2+ pedal pulses. No carotid bruits.  Abdomen: no tenderness, no masses palpated. No hepatosplenomegaly. Bowel sounds positive.  Musculoskeletal: no clubbing / cyanosis. No joint deformity upper and lower extremities. Good ROM, no contractures. Normal muscle tone.  Skin: no rashes, lesions, ulcers. No induration Neurologic: CN 2-12 grossly intact. Sensation intact, DTR normal. Strength 5/5 in all 4.  Psychiatric: Normal judgment and insight. Alert and oriented x 3. Normal mood.     Labs on Admission: I have personally reviewed following labs and imaging studies  CBC: Recent Labs  Lab 08/08/20 1526 08/08/20 1614  WBC 6.7  --   NEUTROABS 4.2  --   HGB 14.0 14.6  HCT 44.8 43.0  MCV 92.2  --   PLT 212  --    Basic Metabolic  Panel: Recent Labs  Lab 08/08/20 1526 08/08/20 1614  NA 136 141  K 3.9 3.8  CL 103 104  CO2 26  --   GLUCOSE 96 94  BUN 14 16  CREATININE 0.91 0.90  CALCIUM 9.2  --    GFR: CrCl cannot be calculated (Unknown ideal weight.). Liver Function Tests: Recent Labs  Lab 08/08/20 1526  AST 19  ALT 13  ALKPHOS 72  BILITOT 0.6  PROT 7.1  ALBUMIN 3.4*   Recent Labs  Lab 08/08/20 1526  LIPASE 26   No results for input(s): AMMONIA in the last 168 hours. Coagulation Profile: No results for input(s): INR, PROTIME in the last 168 hours. Cardiac Enzymes: Recent Labs  Lab 08/08/20 1526  CKTOTAL 107   BNP (last 3 results) No results for input(s): PROBNP in the last 8760 hours. HbA1C: No results for input(s): HGBA1C in the last 72 hours. CBG: No results for input(s): GLUCAP in the last 168 hours. Lipid Profile: No results for input(s): CHOL, HDL, LDLCALC, TRIG, CHOLHDL, LDLDIRECT in the last 72 hours. Thyroid Function Tests: No results for input(s): TSH, T4TOTAL, FREET4, T3FREE, THYROIDAB in the last 72 hours. Anemia Panel: No results for input(s): VITAMINB12, FOLATE, FERRITIN, TIBC, IRON, RETICCTPCT in the last 72 hours. Urine analysis:    Component Value Date/Time   COLORURINE YELLOW 08/08/2020 [place] 08/08/2020 1526   LABSPEC >1.046 (H) 08/08/2020 1526   PHURINE 5.0 08/08/2020 1526   GLUCOSEU NEGATIVE 08/08/2020 1526   HGBUR NEGATIVE 08/08/2020 [place] 08/08/2020 1526   BILIRUBINUR small 05/31/2014 1146   KETONESUR NEGATIVE 08/08/2020 1526   PROTEINUR NEGATIVE 08/08/2020 1526   UROBILINOGEN 0.2 05/31/2014 1146   UROBILINOGEN 0.2 01/21/2007 2134   NITRITE NEGATIVE 08/08/2020 [place] 08/08/2020 1526    Radiological Exams on Admission: DG Chest 2 View  Result Date: 08/08/2020 CLINICAL DATA:  Chest pain EXAM: CHEST - 2 VIEW COMPARISON:  05/09/2018 FINDINGS: Post sternotomy changes. No focal opacity or  pleural effusion. Stable cardiomediastinal silhouette with aortic atherosclerosis. No pneumothorax. IMPRESSION: No active cardiopulmonary disease. Electronically Signed   By: Donavan Foil M.D.   On: 08/08/2020 16:07   CT Head Wo Contrast  Result Date: 08/08/2020 CLINICAL DATA:  Trauma EXAM: CT HEAD WITHOUT CONTRAST CT CERVICAL SPINE WITHOUT CONTRAST TECHNIQUE: Multidetector CT imaging of the head and cervical spine  was performed following the standard protocol without intravenous contrast. Multiplanar CT image reconstructions of the cervical spine were also generated. COMPARISON:  CT brain, 11/04/2017 FINDINGS: CT HEAD FINDINGS Brain: No evidence of acute infarction, hemorrhage, hydrocephalus, extra-axial collection or mass lesion/mass effect. Periventricular and deep white matter hypodensity. Vascular: No hyperdense vessel or unexpected calcification. Skull: Normal. Negative for fracture or focal lesion. Sinuses/Orbits: Opacification of the right frontal sinus. Partial opacification of the ethmoid air cells. Polyps and/or mucous retention cysts of the right nasal cavity (series 4, image 25). Other: None. CT CERVICAL SPINE FINDINGS Alignment: Normal. Skull base and vertebrae: No acute fracture. No primary bone lesion or focal pathologic process. Soft tissues and spinal canal: No prevertebral fluid or swelling. No visible canal hematoma. Disc levels: Mild to moderate disc space height loss and osteophytosis, worst at C6-C7. Upper chest: Negative. Other: None. IMPRESSION: 1. No acute intracranial pathology. 2. Small-vessel white matter disease. 3. Opacification of the right frontal sinus. Partial opacification of the ethmoid air cells. Polyps and/or mucous retention cysts of the right nasal cavity. Correlate for paranasal sinus disease. 4. No fracture or static subluxation of the cervical spine. 5. Mild to moderate multilevel cervical disc degenerative disease. Electronically Signed   By: Eddie Candle M.D.   On:  08/08/2020 17:02   CT Cervical Spine Wo Contrast  Result Date: 08/08/2020 CLINICAL DATA:  Trauma EXAM: CT HEAD WITHOUT CONTRAST CT CERVICAL SPINE WITHOUT CONTRAST TECHNIQUE: Multidetector CT imaging of the head and cervical spine was performed following the standard protocol without intravenous contrast. Multiplanar CT image reconstructions of the cervical spine were also generated. COMPARISON:  CT brain, 11/04/2017 FINDINGS: CT HEAD FINDINGS Brain: No evidence of acute infarction, hemorrhage, hydrocephalus, extra-axial collection or mass lesion/mass effect. Periventricular and deep white matter hypodensity. Vascular: No hyperdense vessel or unexpected calcification. Skull: Normal. Negative for fracture or focal lesion. Sinuses/Orbits: Opacification of the right frontal sinus. Partial opacification of the ethmoid air cells. Polyps and/or mucous retention cysts of the right nasal cavity (series 4, image 25). Other: None. CT CERVICAL SPINE FINDINGS Alignment: Normal. Skull base and vertebrae: No acute fracture. No primary bone lesion or focal pathologic process. Soft tissues and spinal canal: No prevertebral fluid or swelling. No visible canal hematoma. Disc levels: Mild to moderate disc space height loss and osteophytosis, worst at C6-C7. Upper chest: Negative. Other: None. IMPRESSION: 1. No acute intracranial pathology. 2. Small-vessel white matter disease. 3. Opacification of the right frontal sinus. Partial opacification of the ethmoid air cells. Polyps and/or mucous retention cysts of the right nasal cavity. Correlate for paranasal sinus disease. 4. No fracture or static subluxation of the cervical spine. 5. Mild to moderate multilevel cervical disc degenerative disease. Electronically Signed   By: Eddie Candle M.D.   On: 08/08/2020 17:02   CT Angio Chest/Abd/Pel for Dissection W and/or Wo Contrast  Result Date: 08/08/2020 CLINICAL DATA:  Low back and leg pain, aortic dissection suspected EXAM: CT  ANGIOGRAPHY CHEST, ABDOMEN AND PELVIS TECHNIQUE: Non-contrast CT of the chest was initially obtained. Multidetector CT imaging through the chest, abdomen and pelvis was performed using the standard protocol during bolus administration of intravenous contrast. Multiplanar reconstructed images and MIPs were obtained and reviewed to evaluate the vascular anatomy. CONTRAST:  137m OMNIPAQUE IOHEXOL 350 MG/ML SOLN COMPARISON:  None. FINDINGS: CTA CHEST FINDINGS Cardiovascular: Preferential opacification of the thoracic aorta. Normal contour and caliber of the thoracic aorta. No evidence of aneurysm, dissection, or other acute aortic syndrome. Moderate mixed calcific atherosclerosis.  Normal heart size. Three-vessel coronary artery calcifications status post CABG. There is an unusual, focal thickening and or abnormal soft tissue about the lateral wall of the left ventricle, measuring approximately 8.1 x 2.2 cm (series 6, image 102). Mediastinum/Nodes: No enlarged mediastinal, hilar, or axillary lymph nodes. Moderate hiatal hernia with intrathoracic position of the gastric fundus. Thyroid gland, trachea, and esophagus demonstrate no significant findings. Lungs/Pleura: Minimal dependent bibasilar atelectasis or scarring. No pleural effusion or pneumothorax. Musculoskeletal: No chest wall abnormality. No acute or significant osseous findings. Review of the MIP images confirms the above findings. CTA ABDOMEN AND PELVIS FINDINGS VASCULAR Normal contour and caliber of the abdominal aorta. No evidence of aneurysm, dissection, or other acute aortic pathology. There is standard branching pattern of the abdominal aorta, with solitary bilateral renal arteries. Review of the MIP images confirms the above findings. NON-VASCULAR Hepatobiliary: No solid liver abnormality is seen. No gallstones, gallbladder wall thickening, or biliary dilatation. Pancreas: Unremarkable. No pancreatic ductal dilatation or surrounding inflammatory changes.  Spleen: Normal in size without significant abnormality. Adrenals/Urinary Tract: Adrenal glands are unremarkable. Kidneys are normal, without renal calculi, solid lesion, or hydronephrosis. Bladder is unremarkable. Stomach/Bowel: Stomach is within normal limits. Appendix appears normal. No evidence of bowel wall thickening, distention, or inflammatory changes. Descending and sigmoid diverticulosis. Lymphatic: No enlarged abdominal or pelvic lymph nodes. Reproductive: Prostatomegaly. Other: No abdominal wall hernia or abnormality. No abdominopelvic ascites. Musculoskeletal: No acute or significant osseous findings. Review of the MIP images confirms the above findings. IMPRESSION: 1. Normal contour and caliber of the thoracic and abdominal aorta. No evidence of aneurysm, dissection, or other acute aortic pathology. Moderate aortic atherosclerosis. 2. There is an unusual, focal pericardial thickening and/or abnormal soft tissue about the lateral wall of the left ventricle, measuring approximately 8.1 x 2.2 cm. This is of uncertain nature, slightly increased compared to prior examination dated 2019, at which time it was described as pericardial effusion, however it is clearly soft tissue attenuation on current examination and not consistent with effusion. Cardiac MRI performed on a nonemergent basis may be helpful to more clearly assess nature and etiology of this finding. 3. Moderate hiatal hernia with intrathoracic position of the gastric fundus. 4. Coronary artery disease. 5. Diverticulosis without evidence of diverticulitis. 6. Prostatomegaly. Electronically Signed   By: Eddie Candle M.D.   On: 08/08/2020 17:10    EKG: Independently reviewed.  Sinus no acute ST changes.  Assessment/Plan Active Problems:   Chest pain   Angina of effort [facility])  (please populate well all problems here in Problem List. (For example, if patient is on BP meds at home and you resume or decide to hold them, it is a problem that needs  to be her. Same for CAD, COPD, HLD and so on)  Chest pain -Angina-like, repeatable -Unsure whether this is a underlying worsening of CAD and ischemia versus symptoms related to the newly identified mass on CT.  Radiology recommended MRI, cardio consult, defer to cardiologist for further work-up including MRI. -Continue aspirin Plavix and statin. -Stress test versus coronary artery imaging as per cardiology.  Explained the finding on the CTA to patient's son and wife at bedside, both expressed understanding.  Vertigo versus near syncope -will check orthostasis, however doubt orthostasis is the etiology -Since there is a new finding of the left ventricle mass, other differential such as left ventricular outlet obstruction should be considered as well. Will discuss with cardiology after further imaging. -Resume his home BP meds but hold Lasix given  patient appears to be euvolemic.  HTN -Continue Coreg, restart ARB, hold Lasix  Unsteady gait -PT evaluation, x-ray of left shin area negative for fracture.  DVT prophylaxis: Lovenox Code Status: Full Code Family Communication: Son and wife at bedside Disposition Plan: Expect more than 2 midnight hospital stay for angina work-up and left ventricle mass work-up. Consults called: Cardiology Admission status: Tele admit   Lequita Halt MD [facility] Pager [phone]  08/08/2020, 7:11 PM

## 2020-08-08 NOTE — Telephone Encounter (Signed)
Patient's wife states early this morning when the patient woke up she was holding his hand over his heart with pain/pressure. She states the pain is constant, but it has subsided a little. She is extremely concerned. She states normally the patient watched TV, but today he can't even focus on the TV due to how he feels. Doesn't have nitro, per wife.  Pt c/o of Chest Pain: STAT if CP now or developed within 24 hours  1. Are you having CP right now?  Yes   2. Are you experiencing any other symptoms (ex. SOB, nausea, vomiting, sweating)?  No   3. How long have you been experiencing CP?  Patient's wife states the CP stated early this morning   4. Is your CP continuous or coming and going?  Continuous   5. Have you taken Nitroglycerin?  No  ?

## 2020-08-09 ENCOUNTER — Inpatient Hospital Stay (HOSPITAL_COMMUNITY): Payer: Medicare HMO

## 2020-08-09 DIAGNOSIS — I208 Other forms of angina pectoris: Secondary | ICD-10-CM

## 2020-08-09 DIAGNOSIS — R079 Chest pain, unspecified: Secondary | ICD-10-CM

## 2020-08-09 LAB — NM MYOCAR MULTI W/SPECT W/WALL MOTION / EF
Estimated workload: 1 METS
Exercise duration (min): 13 min
Exercise duration (sec): 36 s
Rest HR: 51 {beats}/min

## 2020-08-09 LAB — ECHOCARDIOGRAM COMPLETE
AR max vel: 2.53 cm2
AV Area VTI: 2.6 cm2
AV Area mean vel: 2.59 cm2
AV Mean grad: 5 mmHg
AV Peak grad: 8.3 mmHg
Ao pk vel: 1.44 m/s
Area-P 1/2: 3.42 cm2
Height: 65 in
S' Lateral: 2.8 cm
Weight: 2868.8 oz

## 2020-08-09 MED ORDER — TECHNETIUM TC 99M TETROFOSMIN IV KIT
32.2000 | PACK | Freq: Once | INTRAVENOUS | Status: AC | PRN
  Administered 2020-08-09: 32.2 via INTRAVENOUS

## 2020-08-09 MED ORDER — TECHNETIUM TC 99M TETROFOSMIN IV KIT
10.0000 | PACK | Freq: Once | INTRAVENOUS | Status: AC | PRN
  Administered 2020-08-09: 10 via INTRAVENOUS

## 2020-08-09 MED ORDER — AMINOPHYLLINE 25 MG/ML IV SOLN
INTRAVENOUS | Status: AC
  Filled 2020-08-09: qty 10

## 2020-08-09 MED ORDER — REGADENOSON 0.4 MG/5ML IV SOLN
0.4000 mg | Freq: Once | INTRAVENOUS | Status: AC
  Administered 2020-08-09: 0.4 mg via INTRAVENOUS

## 2020-08-09 MED ORDER — AMINOPHYLLINE 25 MG/ML IV (NUC MED)
75.0000 mg | Freq: Once | INTRAVENOUS | Status: AC
  Administered 2020-08-09: 75 mg via INTRAVENOUS

## 2020-08-09 MED ORDER — REGADENOSON 0.4 MG/5ML IV SOLN
INTRAVENOUS | Status: AC
  Filled 2020-08-09: qty 5

## 2020-08-09 NOTE — Evaluation (Signed)
Physical Therapy Evaluation Patient Details Name: [name] MRN: [id number] DOB: [birth year] Today's Date: 08/09/2020   History of Present Illness  Pt is a 80 y.o. male admitted 08/08/20 with acute onset chest pain and SOB while doing yard work, also with h/o feeling dizzy when bending down, unsteady gait. Pt found to have significant HTN. CTA showed L ventricle mass. PMH includes HTN, CAD (s/p CABG), NSTEMI, stroke, vertigo.   Clinical Impression  Pt presents with an overall decrease in functional mobility secondary to above. PTA, pt reports independent with mobility and ADLs, also reports he occasionally needs assist to stand from lower surfaces, wife and son perform majority of household tasks. Today, pt requiring up to minA to maintain stability with transfers and ambulation. Pt reports limited by SOB and L-side flank pain post-walk; pt seems anxious(?) regarding fearful of falling and HTN. Pt would benefit from continued acute PT services to maximize functional mobility and independence prior to d/c with HHPT services.   Orthostatic BPs Supine 146/77, HR 64  Sitting 151/101, HR 63  Standing 148/115  Standing after 2 min 162/94  Post-ambulation 155/109, HR 56   SpO2 99-100% on RA   Follow Up Recommendations Home health PT;Supervision for mobility/OOB    Equipment Recommendations   (shower seat)    Recommendations for Other Services       Precautions / Restrictions Precautions Precautions: Fall;Other (comment) Precaution Comments: Watch BP Restrictions Weight Bearing Restrictions: No      Mobility  Bed Mobility Overal bed mobility: Modified Independent                  Transfers Overall transfer level: Needs assistance Equipment used: None Transfers: Sit to/from Stand Sit to Stand: Min guard         General transfer comment: Pt reaching for HHA to maintain stability upon initial stand, reports "I always need a hand to  stand"  Ambulation/Gait Ambulation/Gait assistance: Min guard;Min assist Gait Distance (Feet): 120 Feet Assistive device: None;1 person hand held assist Gait Pattern/deviations: Step-through pattern;Decreased stride length;Trunk flexed Gait velocity: Decreased   General Gait Details: Pt requesting initial HHA, but declines RW use; slow, mildly unsteady gait with intermittent minA for HHA, pt also reaching to furniture/hand rails for UE support; 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    Modified Rankin (Stroke Patients Only)       Balance Overall balance assessment: Needs assistance   Sitting balance-Leahy Scale: Good       Standing balance-Leahy Scale: Fair Standing balance comment: Static and dynamic stability improved with UE support                             Pertinent Vitals/Pain Pain Assessment: Faces Faces Pain Scale: Hurts little more Pain Location: L-side posterior flank Pain Descriptors / Indicators: Discomfort;Grimacing;Guarding Pain Intervention(s): Monitored during session    Home Living Family/patient expects to be discharged to:: Private residence Living Arrangements: Spouse/significant other;Children Available Help at Discharge: Family;Available 24 hours/day Type of Home: House Home Access: Stairs to enter Entrance Stairs-Rails: None Entrance Stairs-Number of Steps: 2 Home Layout: One level Home Equipment: Walker - 2 wheels;Cane - single point      Prior Function Level of Independence: Needs assistance   Gait / Transfers Assistance Needed: Reports he typically needs "a hand" to stand, will pull on sink when standing from toilet; indep ambulating without DME, will occasionally  use SPC  ADL's / Homemaking Assistance Needed: Reports indep with ADLs; typically stands to shower and leans against wall, will occasionally sit if he feels like he is going to fall. Wife and son perform 23 of household tasks        Hand  Dominance        Extremity/Trunk Assessment   Upper Extremity Assessment Upper Extremity Assessment: Overall WFL for tasks assessed    Lower Extremity Assessment Lower Extremity Assessment: Generalized weakness       Communication   Communication: No difficulties  Cognition Arousal/Alertness: Awake/alert Behavior During Therapy: WFL for tasks assessed/performed;Anxious Overall Cognitive Status: Within Functional Limits for tasks assessed                                 General Comments: WFL for simple tasks in [place]; pt becoming tearful at end of session, seems related to anxiety (?) regarding mobility deficits and pain. Very appreciative of session      General Comments General comments (skin integrity, edema, etc.): Discussed DME recommendations for fall risk reduction    Exercises     Assessment/Plan    PT Assessment Patient needs continued PT services  PT Problem List Decreased strength;Decreased activity tolerance;Decreased balance;Decreased mobility;Decreased knowledge of use of DME;Cardiopulmonary status limiting activity       PT Treatment Interventions DME instruction;Gait training;Stair training;Functional mobility training;Therapeutic exercise;Therapeutic activities;Balance training;Patient/family education    PT Goals (Current goals can be found in the Care Plan section)  Acute Rehab PT Goals Patient Stated Goal: Get back to working in yard PT Goal Formulation: With patient Time For Goal Achievement: 08/23/20 Potential to Achieve Goals: Good    Frequency Min 3X/week   Barriers to discharge        Co-evaluation               AM-PAC PT "6 Clicks" Mobility  Outcome Measure Help needed turning from your back to your side while in a flat bed without using bedrails?: None Help needed moving from lying on your back to sitting on the side of a flat bed without using bedrails?: None Help needed moving to and from a bed to a chair  (including a wheelchair)?: A Little Help needed standing up from a chair using your arms (e.g., wheelchair or bedside chair)?: A Little Help needed to walk in hospital room?: A Little Help needed climbing 3-5 steps with a railing? : A Little 6 Click Score: 20    End of Session Equipment Utilized During Treatment: Gait belt Activity Tolerance: Patient tolerated treatment well;Patient limited by fatigue Patient left: in bed;with call bell/phone within reach;with bed alarm set Nurse Communication: Mobility status PT Visit Diagnosis: Other abnormalities of gait and mobility (R26.89);Muscle weakness (generalized) (M62.81)    Time: 3532-9924 PT Time Calculation (min) (ACUTE ONLY): 17 min   Charges:   PT Evaluation $PT Eval Moderate Complexity: [place], PT, DPT Acute Rehabilitation Services  Pager [phone] Office [place] 08/09/2020, 10:39 AM

## 2020-08-09 NOTE — Progress Notes (Signed)
  Echocardiogram 2D Echocardiogram has been performed.  [name] F 08/09/2020, 12:42 PM

## 2020-08-09 NOTE — Progress Notes (Signed)
Brief cardiology progress note:  I have been unable to speak to and examine the patient as she has been out of her room for testing. Telemetry, labs reviewed.   Chest pain, with both typical and atypical components History of CAD with prior CABG 2019 -hsTnI normal and flat -given exertional component, planned for lexiscan myoview today -echo pending   Possible pericardial mass -cardiac MRI ordered -echo pending  We will follow up on results of testing.  Buford Dresser, MD, PhD, [place]  [address], [place], [place] [address] [phone]

## 2020-08-09 NOTE — Progress Notes (Signed)
[name]  XYD:[id number] DOB: 1940/07/17 DOA: 08/08/2020 PCP: Horald Pollen, MD    Brief Narrative:  80 year old with a history of CAD status post CABG, HTN, and HLD who presented to the ED 6/20 with worsening chest pain.  He reported chronic chest pain with exertion for many months, but the day of his presentation experienced a particularly severe episode accompanied by severe shortness of breath and radiation to the left chest.  In the ED his EKG was without acute ST changes.  CTa of the chest was negative for dissection but suggested a thickened abnormal region of there pericardium abutting the LV.  Significant Events:  6/20 admit via [place]  Consultants:  [facility]  Code Status: FULL CODE  Antimicrobials:  None  DVT prophylaxis: Lovenox  Subjective: Heart rate somewhat low at 58-63.  Blood pressure stable.  Saturations 98% on room air. Visited patient's room to see him but he has been busy th/o the day with MRI and Lexiscan. Cardiology has been attending to all acute issues.   Assessment & Plan:  Chest pain -CAD s/p CABG x4 2019 Symptoms concerning for angina - cardiology following - Lexiscan Myoview planned  Pericardial thickening (abuts the LV) Cardiac MRI planned for Cardiology  Recurrent presyncopal spells TTE pending - cardiac MRI pending   Chronic diastolic CHF Grade 2 DD per TTE 08/26/19  HTN  Family Communication:  Status is: Inpatient  Remains inpatient appropriate because:Inpatient level of care appropriate due to severity of illness  Dispo: The patient is from: Home              Anticipated d/c is to: Home              Patient currently is not medically stable to d/c.   Difficult to place patient No   Objective: Blood pressure 127/77, pulse (!) 56, temperature 98 F (36.7 C), temperature source Oral, resp. rate 18, height 5\' 5"  (1.651 m), weight 81.3 kg, SpO2 98 %. No intake or output data in the 24 hours ending 08/09/20  0754 Filed Weights   08/08/20 2226  Weight: 81.3 kg    Examination: No exam today as pt busy in testing for prolonged period of time   CBC: Recent Labs  Lab 08/08/20 1526 08/08/20 1614  WBC 6.7  --   NEUTROABS 4.2  --   HGB 14.0 14.6  HCT 44.8 43.0  MCV 92.2  --   PLT 212  --    Basic Metabolic Panel: Recent Labs  Lab 08/08/20 1526 08/08/20 1614  NA 136 141  K 3.9 3.8  CL 103 104  CO2 26  --   GLUCOSE 96 94  BUN 14 16  CREATININE 0.91 0.90  CALCIUM 9.2  --    GFR: Estimated Creatinine Clearance: 66.4 mL/min (by C-G formula based on SCr of 0.9 mg/dL).  Liver Function Tests: Recent Labs  Lab 08/08/20 1526  AST 19  ALT 13  ALKPHOS 72  BILITOT 0.6  PROT 7.1  ALBUMIN 3.4*   Recent Labs  Lab 08/08/20 1526  LIPASE 26    Cardiac Enzymes: Recent Labs  Lab 08/08/20 1526  CKTOTAL 107    HbA1C: Hgb A1c MFr Bld  Date/Time Value Ref Range Status  05/31/2020 01:45 PM 6.1 4.6 - 6.5 % Final    Comment:    Glycemic Control Guidelines for People with Diabetes:Non Diabetic:  <6%Goal of Therapy: <7%Additional Action Suggested:  >8%   04/15/2017 02:41 AM 5.9 (H) 4.8 -  5.6 % Final    Comment:    (NOTE)         Prediabetes: 5.7 - 6.4         Diabetes: >6.4         Glycemic control for adults with diabetes: <7.0      Recent Results (from the past 240 hour(s))  Resp Panel by RT-PCR (Flu A&B, Covid) Urine, Clean Catch     Status: None   Collection Time: 08/08/20  3:29 PM   Specimen: Urine, Clean Catch; Nasopharyngeal(NP) swabs in vial transport medium  Result Value Ref Range Status   SARS Coronavirus 2 by RT PCR NEGATIVE NEGATIVE Final    Comment: (NOTE) SARS-CoV-2 target nucleic acids are NOT DETECTED.  The SARS-CoV-2 RNA is generally detectable in upper respiratory specimens during the acute phase of infection. The lowest concentration of SARS-CoV-2 viral copies this assay can detect is 138 copies/mL. A negative result does not preclude  SARS-Cov-2 infection and should not be used as the sole basis for treatment or other patient management decisions. A negative result may occur with  improper specimen collection/handling, submission of specimen other than nasopharyngeal swab, presence of viral mutation(s) within the areas targeted by this assay, and inadequate number of viral copies(<138 copies/mL). A negative result must be combined with clinical observations, patient history, and epidemiological information. The expected result is Negative.  Fact Sheet for Patients:  EntrepreneurPulse.com.au  Fact Sheet for Healthcare Providers:  IncredibleEmployment.be  This test is no t yet approved or cleared by the [place] FDA and  has been authorized for detection and/or diagnosis of SARS-CoV-2 by FDA under an Emergency Use Authorization (EUA). This EUA will remain  in effect (meaning this test can be used) for the duration of the COVID-19 declaration under Section 564(b)(1) of the Act, 21 U.S.C.section 360bbb-3(b)(1), unless the authorization is terminated  or revoked sooner.       Influenza A by PCR NEGATIVE NEGATIVE Final   Influenza B by PCR NEGATIVE NEGATIVE Final    Comment: (NOTE) The Xpert Xpress SARS-CoV-2/FLU/RSV plus assay is intended as an aid in the diagnosis of influenza from Nasopharyngeal swab specimens and should not be used as a sole basis for treatment. Nasal washings and aspirates are unacceptable for Xpert Xpress SARS-CoV-2/FLU/RSV testing.  Fact Sheet for Patients: EntrepreneurPulse.com.au  Fact Sheet for Healthcare Providers: IncredibleEmployment.be  This test is not yet approved or cleared by the [place] FDA and has been authorized for detection and/or diagnosis of SARS-CoV-2 by FDA under an Emergency Use Authorization (EUA). This EUA will remain in effect (meaning this test can be used) for the duration of  the COVID-19 declaration under Section 564(b)(1) of the Act, 21 U.S.C. section 360bbb-3(b)(1), unless the authorization is terminated or revoked.  Performed at [facility], [place] [address], [place], [place] [address]      Scheduled Meds:  acetaminophen  325 mg Oral Daily   aspirin EC  81 mg Oral Daily   carvedilol  12.5 mg Oral BID   clopidogrel  75 mg Oral Daily   enoxaparin (LOVENOX) injection  30 mg Subcutaneous Q24H   irbesartan  300 mg Oral Daily   rosuvastatin  40 mg Oral Daily      LOS: 1 day   Cherene Altes, MD [facility] Office  [phone] Pager - Text Page per Shea Evans  If 7PM-7AM, please contact night-coverage per [place] 08/09/2020, 7:54 AM

## 2020-08-09 NOTE — TOC Initial Note (Addendum)
Transition of Care [facility]) - Initial/Assessment Note    Patient Details  Name: [name] MRN: [id number] Date of Birth: 02-Apr-1940  [facility] CM/SW Contact:    Gerald Meeker, RN Phone Number: 08/09/2020, 12:14 PM  Clinical Narrative:  Case Manager  spoke with patient's wife, [name], she speaks English very well. Discussed need for Home Health therapy at discharge. She has no preference and gave permission for CM to arrange for Home Health therapy. Referral was called to [name], [facility] Liaison. Patient has family support at discharge.  Expected Discharge Plan: [name] Barriers to Discharge: Continued Medical Work up   Patient Goals and CMS Choice Patient states their goals for this hospitalization and ongoing recovery are:: wife: to go home CMS Medicare.gov Compare Post Acute Care list provided to:: Other (Comment Required) (spouse) Choice offered to / list presented to : Spouse  Expected Discharge Plan and Services Expected Discharge Plan: [place] In-house Referral: NA Discharge Planning Services: CM Consult Post Acute Care Choice: Home Health, Durable Medical Equipment Living arrangements for the past 2 months: Single Family Home                 DME Arranged: Shower stool DME Agency: [employer] Date DME Agency Contacted: 08/09/20 Time DME Agency Contacted: 58 Representative spoke with at DME Agency: [name] HH Arranged: PT   Date [place]: 08/09/20 Time [place]: 1211 Representative spoke with at [place]: [name] Arrangements/Services Living arrangements for the past 2 months: [name] Lives with:: Spouse          Need for Family Participation in Patient Care: Yes (Comment) Care giver support system in place?: Yes (comment)   Criminal Activity/Legal Involvement Pertinent to Current Situation/Hospitalization: No - Comment as needed  Activities  of Daily Living Home Assistive Devices/Equipment: Cane (specify quad or straight) (straight) ADL Screening (condition at time of admission) Patient's cognitive ability adequate to safely complete daily activities?: Yes Is the patient deaf or have difficulty hearing?: No Does the patient have difficulty seeing, even when wearing glasses/contacts?: No Does the patient have difficulty concentrating, remembering, or making decisions?: Yes Patient able to express need for assistance with ADLs?: Yes Does the patient have difficulty dressing or bathing?: Yes Independently performs ADLs?: No Communication: Independent Dressing (OT): Needs assistance Is this a change from baseline?: Pre-admission baseline Grooming: Independent Feeding: Independent Bathing: Independent Toileting: Independent In/Out Bed: Needs assistance Is this a change from baseline?: Pre-admission baseline Walks in Home: Independent with device (comment) (cane) Does the patient have difficulty walking or climbing stairs?: Yes Weakness of Legs: Both Weakness of Arms/Hands: None  Permission Sought/Granted Permission sought to share information with : Case Manager       Permission granted to share info w AGENCY: [name], [employer]        Emotional Assessment       Orientation: : Oriented to Self, Oriented to Place, Oriented to  Time, Oriented to Situation Alcohol / Substance Use: Not Applicable Psych Involvement: No (comment)  Admission diagnosis:  Angina of effort ([place]) [I20.8] Hiatal hernia [K44.9] Left leg pain [M79.605] Acute chest pain [R07.9] Patient Active Problem List   Diagnosis Date Noted   Angina of effort ([place]) 08/08/2020   Type 2 diabetes mellitus with other specified complication, without long-term current use of insulin ([place]) 05/31/2020   Aortic atherosclerosis ([place]) 05/31/2020   Acute on chronic diastolic heart failure ([place]) 02/14/2018  Pericardial effusion    Abnormal CT of the chest 11/05/2017    Non-ST elevation (NSTEMI) myocardial infarction [facility])    Essential hypertension 05/17/2016   Ischemic stroke ([place]) 05/02/2016   pandiverticulosis 04/22/2012   Internal hemorrhoids 04/19/2012   Acute chest pain 04/16/2012   Hyperlipidemia with target low density lipoprotein (LDL) cholesterol less than 70 mg/dL 06/14/2008   Hypertensive heart disease 06/14/2008   Coronary artery disease of native heart with stable angina pectoris, unspecified vessel or lesion type ([place]) 06/14/2008   PCP:  Gerald Pollen, MD Pharmacy:   [facility] - [place], [place] [address] Phone: [phone] Fax: [phone]  [id number] - [place], [address] [place] [address] Phone: [phone] Fax: [phone]  [place], [place] [place] [address] Phone: [phone] Fax: [phone]     Social Determinants of Health (SDOH) Interventions    Readmission Risk Interventions No flowsheet data found.

## 2020-08-09 NOTE — Progress Notes (Signed)
[name] presented for a nuclear stress test today.  Patient had flushed throat closing sensation @ 3 minutes  >> improved but later developed L sided chest pressure radiating to his L arm round 6-7 minutes into test. Reversal agent given and symptoms resolved.  Stress imaging is pending at this time.   Preliminary EKG findings may be listed in the chart, but the stress test result will not be finalized until perfusion imaging is complete.   [place], [place]  08/09/2020 11:09 AM

## 2020-08-10 ENCOUNTER — Telehealth: Payer: Self-pay | Admitting: Cardiovascular Disease

## 2020-08-10 ENCOUNTER — Ambulatory Visit: Payer: Medicare HMO | Admitting: Emergency Medicine

## 2020-08-10 DIAGNOSIS — R079 Chest pain, unspecified: Secondary | ICD-10-CM | POA: Diagnosis not present

## 2020-08-10 DIAGNOSIS — I318 Other specified diseases of pericardium: Secondary | ICD-10-CM

## 2020-08-10 DIAGNOSIS — Z0289 Encounter for other administrative examinations: Secondary | ICD-10-CM

## 2020-08-10 MED ORDER — PANTOPRAZOLE SODIUM 40 MG PO TBEC
40.0000 mg | DELAYED_RELEASE_TABLET | Freq: Every day | ORAL | Status: DC
  Administered 2020-08-10: 40 mg via ORAL
  Filled 2020-08-10: qty 1

## 2020-08-10 MED ORDER — PANTOPRAZOLE SODIUM 40 MG PO TBEC: 40.0000 mg | DELAYED_RELEASE_TABLET | Freq: Every day | ORAL | 0 refills | Status: DC

## 2020-08-10 NOTE — Telephone Encounter (Signed)
New message:     Patient daughter calling stating that she live in [place] and her father was in the hospital for the last few days and did MRI and a CT and stated that he has a large mass on his heart. She would like for a nurse to call so she can make arrangement to come and help her parents.

## 2020-08-10 NOTE — Progress Notes (Signed)
Physical Therapy Treatment Patient Details Name: [name] MRN: [id number] DOB: Dec 18, 1940 Today's Date: 08/10/2020    History of Present Illness Pt is a 80 y.o. male admitted 08/08/20 with acute onset chest pain and SOB while doing yard work, also with h/o feeling dizzy when bending down, unsteady gait. Pt found to have significant HTN. CTA showed L ventricle mass. PMH includes HTN, CAD (s/p CABG), NSTEMI, stroke, vertigo.    PT Comments    Pt making good progress today.  Able to increase gait with VSS and no reports of pain or dizziness.  Able to complete normal gait without UE support but with dynamic gait (quick turns) he does reach for support, but does not want to use AD.  Educated on safety at home.  Pt has support from wife and sons.    Follow Up Recommendations  Home health PT;Supervision for mobility/OOB     Equipment Recommendations  Other (comment) (shower seat)    Recommendations for Other Services       Precautions / Restrictions Precautions Precautions: Fall Restrictions Weight Bearing Restrictions: No    Mobility  Bed Mobility Overal bed mobility: Independent             General bed mobility comments: sitting EOB at arrival    Transfers Overall transfer level: Needs assistance Equipment used: None Transfers: Sit to/from Stand Sit to Stand: Supervision            Ambulation/Gait Ambulation/Gait assistance: Min guard;Supervision Gait Distance (Feet): 300 Feet Assistive device: None Gait Pattern/deviations: Step-through pattern Gait velocity: Decreased   General Gait Details: min guard progressed to supervision; ambulated slowly but without LOB; Did reach for support with quick turns   Marine scientist Rankin (Stroke Patients Only)       Balance Overall balance assessment: Needs assistance   Sitting balance-Leahy Scale: Normal     Standing balance support: No upper extremity  supported Standing balance-Leahy Scale: Fair                              Cognition Arousal/Alertness: Awake/alert Behavior During Therapy: WFL for tasks assessed/performed;Anxious Overall Cognitive Status: Within Functional Limits for tasks assessed                                        Exercises      General Comments General comments (skin integrity, edema, etc.): All VSS during therapy.  Pt had no c/o dizziness, fatigue, or pain.  Eduated on safety , gradual increase in activity, and having assist with harder task      Pertinent Vitals/Pain Pain Assessment: No/denies pain    Home Living                      Prior Function            PT Goals (current goals can now be found in the care plan section) Acute Rehab PT Goals Patient Stated Goal: Get back to working in yard PT Goal Formulation: With patient Time For Goal Achievement: 08/23/20 Potential to Achieve Goals: Good Progress towards PT goals: Progressing toward goals    Frequency    Min 3X/week      PT Plan Current plan remains appropriate    Co-evaluation  AM-PAC PT "6 Clicks" Mobility   Outcome Measure  Help needed turning from your back to your side while in a flat bed without using bedrails?: None Help needed moving from lying on your back to sitting on the side of a flat bed without using bedrails?: None Help needed moving to and from a bed to a chair (including a wheelchair)?: A Little Help needed standing up from a chair using your arms (e.g., wheelchair or bedside chair)?: A Little Help needed to walk in hospital room?: A Little Help needed climbing 3-5 steps with a railing? : A Little 6 Click Score: 20    End of Session Equipment Utilized During Treatment: Gait belt Activity Tolerance: Patient tolerated treatment well Patient left: in bed;with call bell/phone within reach;with family/visitor present Nurse Communication: Mobility  status PT Visit Diagnosis: Other abnormalities of gait and mobility (R26.89);Muscle weakness (generalized) (M62.81)     Time: 6222-9798 PT Time Calculation (min) (ACUTE ONLY): 20 min  Charges:  $Gait Training: 8-22 mins                     Abran Richard, PT Acute Rehab Services Pager [phone] [facility] [place] 08/10/2020, 11:51 AM

## 2020-08-10 NOTE — Progress Notes (Signed)
Pt is alert and oriented. Discharge instructions/AVS  given to pt and the wife.

## 2020-08-10 NOTE — Discharge Summary (Signed)
Physician Discharge Summary  Tag Wurtz MWU:[id number] DOB: 05/06/1940 DOA: 08/08/2020  PCP: Horald Pollen, MD  Admit date: 08/08/2020 Discharge date: 08/10/2020  Admitted From: Home Disposition: Home   Recommendations for Outpatient Follow-up:  Follow up with PCP in 1-2 weeks Follow up with cardiology as outpatient for cardiac MRI.  Home Health: PT Equipment/Devices: Shower stool Discharge Condition: Stable CODE STATUS: Full Diet recommendation: Heart healthy  Brief/Interim Summary: [name] is a 80 y.o. male with a history of CAD s/p CABG 2019, HTN, HLD who presented to the ED 6/20 with chest pain worsening with exertion and associated dyspnea. No ST changes on ECG. Troponin normal and flat. CTA chest was negative for dissection but suggested a thickened abnormal region of there pericardium abutting the LV. Leane Call was a low risk study and cardiology has cleared the patient for discharge with plans to start PPI for reflux symptoms and cardiac MRI that can be accomplished as an outpatient.   Discharge Diagnoses:  Active Problems:   Acute chest pain   Angina of effort [facility])  Chest pain, CAD s/p CABG x4 2019: Lexiscan myoview is a low risk study, altogether reassuring work up to date, and symptoms have resolved.  - Continue ASA, plavix, coreg, rosuvastatin. Allergy to atorvastatin is noted. - Will trial PPI at DC  Focal pericardial thickening in the posterior lateral aspect of the pericardial space: Does not explain symptoms. Cardiology suspects a loculated effusion related to prior bypass surgery.  Was present on his CTA in 2019. Echocardiogram did not visualize this well. - Cardiac MRI planned per cardiology, can occur as outpatient.   Recurrent presyncopal spells TTE pending - cardiac MRI pending   Chronic diastolic CHF: Grade 2 DD per TTE 08/26/19   HTN: No changes to home medications  Discharge Instructions Discharge Instructions     Diet -  low sodium heart healthy   Complete by: As directed    Discharge instructions   Complete by: As directed    You were evaluated for chest pain that does not appear to be related to your heart. A medication, protonix, has been recommended by cardiology that may help if your symptoms are related to reflux. Please take this daily until you follow up with your PCP (you need to scheduled that appointment) and/or cardiology (follow up with Dr. Burt Knack has been scheduled for you).   You will need a MRI of the heart as an outpatient.   If your symptoms return, seek medical attention right away.   Continue other home medications as you were. For any refills, please call your pharmacy to request additional medications from your prescribing doctor.   Increase activity slowly   Complete by: As directed       Allergies as of 08/10/2020       Reactions   Lipitor [atorvastatin] Shortness Of Breath, Other (See Comments)   Sores non head   Morphine And Related Shortness Of Breath, Swelling, Rash   Throat swelling   Pork-derived Products Swelling, Rash   Tongue swelling No pork products -         Medication List     TAKE these medications    acetaminophen 325 MG tablet Commonly known as: TYLENOL Take 325 mg by mouth daily.   aspirin EC 81 MG tablet Take 81 mg by mouth daily.   carvedilol 12.5 MG tablet Commonly known as: COREG Take 1 tablet (12.5 mg total) by mouth 2 (two) times daily.   clopidogrel 75 MG tablet  Commonly known as: PLAVIX Take 1 tablet (75 mg total) by mouth daily.   furosemide 40 MG tablet Commonly known as: LASIX Take 1 tablet (40 mg total) by mouth every other day.   pantoprazole 40 MG tablet Commonly known as: PROTONIX Take 1 tablet (40 mg total) by mouth daily. Start taking on: August 11, 2020   potassium chloride SA 20 MEQ tablet Commonly known as: KLOR-CON Take 1 tablet (20 mEq total) by mouth every other day.   rosuvastatin 40 MG tablet Commonly known  as: CRESTOR Take 1 tablet (40 mg total) by mouth daily.   valsartan 320 MG tablet Commonly known as: Diovan Take 1 tablet (320 mg total) by mouth daily.               Durable Medical Equipment  (From admission, onward)           Start     Ordered   08/09/20 1230  For home use only DME Shower stool  Once        08/09/20 1229            Follow-up Information     [facility] Follow up.   Specialty: [place] Why: A representative from [facility] will contact you to arrange start date and time for your therapy. Contact information: [address] [place] [address] [phone]         [facility] Follow up.   Why: Representative will deliver shower chair to room before discharge. Contact information: [place] [address] [phone]         Sherren Mocha, MD Follow up on 09/07/2020.   Specialty: Cardiology Why: at 1:45 pm Contact information: [address] [place] [address] [phone]         Horald Pollen, MD Follow up.   Specialty: Internal Medicine Contact information: [place] [address] [phone]         Sherren Mocha, MD .   Specialty: Cardiology Contact information: [phone] [address] [place] [address] [phone]                Allergies  Allergen Reactions   Lipitor [Atorvastatin] Shortness Of Breath and Other (See Comments)    Sores non head   Morphine And Related Shortness Of Breath, Swelling and Rash    Throat swelling   Pork-Derived Products Swelling and Rash    Tongue swelling No pork products -     Consultations: Cardiology  Procedures/Studies: DG Chest 2 View  Result Date: 08/08/2020 CLINICAL DATA:  Chest pain EXAM: CHEST - 2 VIEW COMPARISON:  05/09/2018 FINDINGS: Post sternotomy changes. No focal opacity or pleural effusion. Stable cardiomediastinal  silhouette with aortic atherosclerosis. No pneumothorax. IMPRESSION: No active cardiopulmonary disease. Electronically Signed   By: Donavan Foil M.D.   On: 08/08/2020 16:07   CT Head Wo Contrast  Result Date: 08/08/2020 CLINICAL DATA:  Trauma EXAM: CT HEAD WITHOUT CONTRAST CT CERVICAL SPINE WITHOUT CONTRAST TECHNIQUE: Multidetector CT imaging of the head and cervical spine was performed following the standard protocol without intravenous contrast. Multiplanar CT image reconstructions of the cervical spine were also generated. COMPARISON:  CT brain, 11/04/2017 FINDINGS: CT HEAD FINDINGS Brain: No evidence of acute infarction, hemorrhage, hydrocephalus, extra-axial collection or mass lesion/mass effect. Periventricular and deep white matter hypodensity. Vascular: No hyperdense vessel or unexpected calcification. Skull: Normal. Negative for fracture or focal lesion. Sinuses/Orbits:  Opacification of the right frontal sinus. Partial opacification of the ethmoid air cells. Polyps and/or mucous retention cysts of the right nasal cavity (series 4, image 25). Other: None. CT CERVICAL SPINE FINDINGS Alignment: Normal. Skull base and vertebrae: No acute fracture. No primary bone lesion or focal pathologic process. Soft tissues and spinal canal: No prevertebral fluid or swelling. No visible canal hematoma. Disc levels: Mild to moderate disc space height loss and osteophytosis, worst at C6-C7. Upper chest: Negative. Other: None. IMPRESSION: 1. No acute intracranial pathology. 2. Small-vessel white matter disease. 3. Opacification of the right frontal sinus. Partial opacification of the ethmoid air cells. Polyps and/or mucous retention cysts of the right nasal cavity. Correlate for paranasal sinus disease. 4. No fracture or static subluxation of the cervical spine. 5. Mild to moderate multilevel cervical disc degenerative disease. Electronically Signed   By: Eddie Candle M.D.   On: 08/08/2020 17:02   CT Cervical Spine Wo  Contrast  Result Date: 08/08/2020 CLINICAL DATA:  Trauma EXAM: CT HEAD WITHOUT CONTRAST CT CERVICAL SPINE WITHOUT CONTRAST TECHNIQUE: Multidetector CT imaging of the head and cervical spine was performed following the standard protocol without intravenous contrast. Multiplanar CT image reconstructions of the cervical spine were also generated. COMPARISON:  CT brain, 11/04/2017 FINDINGS: CT HEAD FINDINGS Brain: No evidence of acute infarction, hemorrhage, hydrocephalus, extra-axial collection or mass lesion/mass effect. Periventricular and deep white matter hypodensity. Vascular: No hyperdense vessel or unexpected calcification. Skull: Normal. Negative for fracture or focal lesion. Sinuses/Orbits: Opacification of the right frontal sinus. Partial opacification of the ethmoid air cells. Polyps and/or mucous retention cysts of the right nasal cavity (series 4, image 25). Other: None. CT CERVICAL SPINE FINDINGS Alignment: Normal. Skull base and vertebrae: No acute fracture. No primary bone lesion or focal pathologic process. Soft tissues and spinal canal: No prevertebral fluid or swelling. No visible canal hematoma. Disc levels: Mild to moderate disc space height loss and osteophytosis, worst at C6-C7. Upper chest: Negative. Other: None. IMPRESSION: 1. No acute intracranial pathology. 2. Small-vessel white matter disease. 3. Opacification of the right frontal sinus. Partial opacification of the ethmoid air cells. Polyps and/or mucous retention cysts of the right nasal cavity. Correlate for paranasal sinus disease. 4. No fracture or static subluxation of the cervical spine. 5. Mild to moderate multilevel cervical disc degenerative disease. Electronically Signed   By: Eddie Candle M.D.   On: 08/08/2020 17:02   NM Myocar Multi W/Spect Tamela Oddi Motion / EF  Result Date: 08/09/2020 CLINICAL DATA:  Chest pain. History of previous median sternotomy and CABG. EXAM: MYOCARDIAL IMAGING WITH SPECT (REST AND  PHARMACOLOGIC-STRESS) GATED LEFT VENTRICULAR WALL MOTION STUDY LEFT VENTRICULAR EJECTION FRACTION TECHNIQUE: Standard myocardial SPECT imaging was performed after resting intravenous injection of 10 mCi Tc-7m tetrofosmin. Subsequently, intravenous infusion of Lexiscan was performed under the supervision of the Cardiology staff. At peak effect of the drug, 30 mCi Tc-69m tetrofosmin was injected intravenously and standard myocardial SPECT imaging was performed. Quantitative gated imaging was also performed to evaluate left ventricular wall motion, and estimate left ventricular ejection fraction. COMPARISON:  Chest radiograph-08/08/2020; CT the chest, abdomen and pelvis-08/08/2020 FINDINGS: Raw images: Mild breast and chest wall attenuation is seen on both provided rest and stress images. Mild GI attenuation is seen, worse on the provided stress images. Mild patient motion artifact is seen, worse on the provided rest images. Perfusion: No decreased activity in the left ventricle on stress imaging to suggest reversible ischemia or infarction. Wall Motion: Normal left ventricular wall  motion. No left ventricular dilation. Left Ventricular Ejection Fraction: 58 % End diastolic volume 70 ml End systolic volume 29 ml IMPRESSION: 1. No scintigraphic evidence of prior infarction or pharmacologically induced ischemia. 2. Normal left ventricular wall motion. 3. Left ventricular ejection fraction 58% 4. Non invasive risk stratification*: Low *2012 Appropriate Use Criteria for Coronary Revascularization Focused Update: J Am Coll Cardiol. 6503;54(6):568-127. http://content.airportbarriers.com.aspx?articleid=[email or web address] Electronically Signed   By: Sandi Mariscal M.D.   On: 08/09/2020 14:53   ECHOCARDIOGRAM COMPLETE  Result Date: 08/09/2020    ECHOCARDIOGRAM REPORT   Patient Name:   DREVIN ORTNER Date of Exam: 08/09/2020 Medical Rec #:  [id number]        Height:       65.0 in Accession #:    [id number]       Weight:       179.3 lb  Date of Birth:  Jun 13, 1940        BSA:          1.888 m Patient Age:    37 years         BP:           155/109 mmHg Patient Gender: M                HR:           64 bpm. Exam Location:  Inpatient Procedure: 2D Echo, Cardiac Doppler and Color Doppler Indications:    R07.9* Chest pain, unspecified  History:        Patient has prior history of Echocardiogram examinations, most                 recent 08/26/2019.  Sonographer:    Merrie Roof RDCS Referring Phys: [id number] Donald  1. Left ventricular ejection fraction, by estimation, is 60 to 65%. The left ventricle has normal function. The left ventricle has no regional wall motion abnormalities. There is mild left ventricular hypertrophy. Left ventricular diastolic parameters are consistent with Grade II diastolic dysfunction (pseudonormalization). Elevated left atrial pressure.  2. Right ventricular systolic function is normal. The right ventricular size is normal.  3. Left atrial size was moderately dilated.  4. The mitral valve is normal in structure. Trivial mitral valve regurgitation. No evidence of mitral stenosis.  5. The aortic valve is tricuspid. Aortic valve regurgitation is trivial. Mild aortic valve sclerosis is present, with no evidence of aortic valve stenosis.  6. The inferior vena cava is normal in size with greater than 50% respiratory variability, suggesting right atrial pressure of 3 mmHg. FINDINGS  Left Ventricle: Left ventricular ejection fraction, by estimation, is 60 to 65%. The left ventricle has normal function. The left ventricle has no regional wall motion abnormalities. The left ventricular internal cavity size was normal in size. There is  mild left ventricular hypertrophy. Left ventricular diastolic parameters are consistent with Grade II diastolic dysfunction (pseudonormalization). Elevated left atrial pressure. Right Ventricle: The right ventricular size is normal. Right ventricular systolic function is normal. Left Atrium:  Left atrial size was moderately dilated. Right Atrium: Right atrial size was normal in size. Pericardium: There is no evidence of pericardial effusion. Mitral Valve: The mitral valve is normal in structure. Mild mitral annular calcification. Trivial mitral valve regurgitation. No evidence of mitral valve stenosis. Tricuspid Valve: The tricuspid valve is normal in structure. Tricuspid valve regurgitation is trivial. No evidence of tricuspid stenosis. Aortic Valve: The aortic valve is tricuspid. Aortic valve regurgitation is trivial. Mild aortic valve sclerosis is present,  with no evidence of aortic valve stenosis. Aortic valve mean gradient measures 5.0 mmHg. Aortic valve peak gradient measures 8.3 mmHg. Aortic valve area, by VTI measures 2.60 cm. Pulmonic Valve: The pulmonic valve was not well visualized. Pulmonic valve regurgitation is not visualized. No evidence of pulmonic stenosis. Aorta: The aortic root is normal in size and structure. Venous: The inferior vena cava is normal in size with greater than 50% respiratory variability, suggesting right atrial pressure of 3 mmHg. IAS/Shunts: No atrial level shunt detected by color flow Doppler.  LEFT VENTRICLE PLAX 2D LVIDd:         4.30 cm  Diastology LVIDs:         2.80 cm  LV e' medial:    5.55 cm/s LV PW:         1.20 cm  LV E/e' medial:  20.0 LV IVS:        1.20 cm  LV e' lateral:   6.85 cm/s LVOT diam:     2.00 cm  LV E/e' lateral: 16.2 LV SV:         88 LV SV Index:   47 LVOT Area:     3.14 cm  RIGHT VENTRICLE RV Basal diam:  3.90 cm LEFT ATRIUM             Index       RIGHT ATRIUM           Index LA diam:        5.30 cm 2.81 cm/m  RA Area:     24.90 cm LA Vol (A2C):   87.7 ml 46.44 ml/m RA Volume:   72.50 ml  38.39 ml/m LA Vol (A4C):   76.4 ml 40.46 ml/m LA Biplane Vol: 83.5 ml 44.22 ml/m  AORTIC VALVE AV Area (Vmax):    2.53 cm AV Area (Vmean):   2.59 cm AV Area (VTI):     2.60 cm AV Vmax:           144.00 cm/s AV Vmean:          101.000 cm/s AV  VTI:            0.338 m AV Peak Grad:      8.3 mmHg AV Mean Grad:      5.0 mmHg LVOT Vmax:         116.00 cm/s LVOT Vmean:        83.200 cm/s LVOT VTI:          0.280 m LVOT/AV VTI ratio: 0.83  AORTA Ao Root diam: 3.50 cm MITRAL VALVE                TRICUSPID VALVE MV Area (PHT): 3.42 cm     TR Peak grad:   27.5 mmHg MV Decel Time: 222 msec     TR Vmax:        262.00 cm/s MV E velocity: 111.00 cm/s MV A velocity: 83.60 cm/s   SHUNTS MV E/A ratio:  1.33         Systemic VTI:  0.28 m                             Systemic Diam: 2.00 cm Kirk Ruths MD Electronically signed by Kirk Ruths MD Signature Date/Time: 08/09/2020/2:20:23 PM    Final    CT Angio Chest/Abd/Pel for Dissection W and/or Wo Contrast  Result Date: 08/08/2020 CLINICAL DATA:  Low back and leg pain, aortic  dissection suspected EXAM: CT ANGIOGRAPHY CHEST, ABDOMEN AND PELVIS TECHNIQUE: Non-contrast CT of the chest was initially obtained. Multidetector CT imaging through the chest, abdomen and pelvis was performed using the standard protocol during bolus administration of intravenous contrast. Multiplanar reconstructed images and MIPs were obtained and reviewed to evaluate the vascular anatomy. CONTRAST:  190mL OMNIPAQUE IOHEXOL 350 MG/ML SOLN COMPARISON:  None. FINDINGS: CTA CHEST FINDINGS Cardiovascular: Preferential opacification of the thoracic aorta. Normal contour and caliber of the thoracic aorta. No evidence of aneurysm, dissection, or other acute aortic syndrome. Moderate mixed calcific atherosclerosis. Normal heart size. Three-vessel coronary artery calcifications status post CABG. There is an unusual, focal thickening and or abnormal soft tissue about the lateral wall of the left ventricle, measuring approximately 8.1 x 2.2 cm (series 6, image 102). Mediastinum/Nodes: No enlarged mediastinal, hilar, or axillary lymph nodes. Moderate hiatal hernia with intrathoracic position of the gastric fundus. Thyroid gland, trachea, and esophagus  demonstrate no significant findings. Lungs/Pleura: Minimal dependent bibasilar atelectasis or scarring. No pleural effusion or pneumothorax. Musculoskeletal: No chest wall abnormality. No acute or significant osseous findings. Review of the MIP images confirms the above findings. CTA ABDOMEN AND PELVIS FINDINGS VASCULAR Normal contour and caliber of the abdominal aorta. No evidence of aneurysm, dissection, or other acute aortic pathology. There is standard branching pattern of the abdominal aorta, with solitary bilateral renal arteries. Review of the MIP images confirms the above findings. NON-VASCULAR Hepatobiliary: No solid liver abnormality is seen. No gallstones, gallbladder wall thickening, or biliary dilatation. Pancreas: Unremarkable. No pancreatic ductal dilatation or surrounding inflammatory changes. Spleen: Normal in size without significant abnormality. Adrenals/Urinary Tract: Adrenal glands are unremarkable. Kidneys are normal, without renal calculi, solid lesion, or hydronephrosis. Bladder is unremarkable. Stomach/Bowel: Stomach is within normal limits. Appendix appears normal. No evidence of bowel wall thickening, distention, or inflammatory changes. Descending and sigmoid diverticulosis. Lymphatic: No enlarged abdominal or pelvic lymph nodes. Reproductive: Prostatomegaly. Other: No abdominal wall hernia or abnormality. No abdominopelvic ascites. Musculoskeletal: No acute or significant osseous findings. Review of the MIP images confirms the above findings. IMPRESSION: 1. Normal contour and caliber of the thoracic and abdominal aorta. No evidence of aneurysm, dissection, or other acute aortic pathology. Moderate aortic atherosclerosis. 2. There is an unusual, focal pericardial thickening and/or abnormal soft tissue about the lateral wall of the left ventricle, measuring approximately 8.1 x 2.2 cm. This is of uncertain nature, slightly increased compared to prior examination dated 2019, at which time it  was described as pericardial effusion, however it is clearly soft tissue attenuation on current examination and not consistent with effusion. Cardiac MRI performed on a nonemergent basis may be helpful to more clearly assess nature and etiology of this finding. 3. Moderate hiatal hernia with intrathoracic position of the gastric fundus. 4. Coronary artery disease. 5. Diverticulosis without evidence of diverticulitis. 6. Prostatomegaly. Electronically Signed   By: Eddie Candle M.D.   On: 08/08/2020 17:10    Subjective: Feels well, eager to discharge with wife at bedside. No chest pain or dyspnea. Seen walking the halls with therapy and had no problems with this.   Discharge Exam: Vitals:   08/10/20 0857 08/10/20 0906  BP: 127/71 127/71  Pulse: 62 67  Resp: 17   Temp: (!) 97.4 F (36.3 C)   SpO2: 98%    General: Pt is alert, awake, not in acute distress Cardiovascular: RRR, S1/S2 +, no rubs, no gallops Respiratory: CTA bilaterally, no wheezing, no rhonchi Abdominal: Soft, NT, ND, bowel sounds + Extremities:  No edema, no cyanosis  Labs: BNP (last 3 results) No results for input(s): BNP in the last 8760 hours. Basic Metabolic Panel: Recent Labs  Lab 08/08/20 1526 08/08/20 1614  NA 136 141  K 3.9 3.8  CL 103 104  CO2 26  --   GLUCOSE 96 94  BUN 14 16  CREATININE 0.91 0.90  CALCIUM 9.2  --    Liver Function Tests: Recent Labs  Lab 08/08/20 1526  AST 19  ALT 13  ALKPHOS 72  BILITOT 0.6  PROT 7.1  ALBUMIN 3.4*   Recent Labs  Lab 08/08/20 1526  LIPASE 26   No results for input(s): AMMONIA in the last 168 hours. CBC: Recent Labs  Lab 08/08/20 1526 08/08/20 1614  WBC 6.7  --   NEUTROABS 4.2  --   HGB 14.0 14.6  HCT 44.8 43.0  MCV 92.2  --   PLT 212  --    Cardiac Enzymes: Recent Labs  Lab 08/08/20 1526  CKTOTAL 107   BNP: Invalid input(s): POCBNP CBG: No results for input(s): GLUCAP in the last 168 hours. D-Dimer No results for input(s): DDIMER in  the last 72 hours. Hgb A1c No results for input(s): HGBA1C in the last 72 hours. Lipid Profile No results for input(s): CHOL, HDL, LDLCALC, TRIG, CHOLHDL, LDLDIRECT in the last 72 hours. Thyroid function studies No results for input(s): TSH, T4TOTAL, T3FREE, THYROIDAB in the last 72 hours.  Invalid input(s): FREET3 Anemia work up No results for input(s): VITAMINB12, FOLATE, FERRITIN, TIBC, IRON, RETICCTPCT in the last 72 hours. Urinalysis    Component Value Date/Time   COLORURINE YELLOW 08/08/2020 1526   APPEARANCEUR CLEAR 08/08/2020 1526   LABSPEC >1.046 (H) 08/08/2020 1526   PHURINE 5.0 08/08/2020 1526   GLUCOSEU NEGATIVE 08/08/2020 1526   HGBUR NEGATIVE 08/08/2020 [place] 08/08/2020 1526   BILIRUBINUR small 05/31/2014 1146   KETONESUR NEGATIVE 08/08/2020 1526   PROTEINUR NEGATIVE 08/08/2020 1526   UROBILINOGEN 0.2 05/31/2014 1146   UROBILINOGEN 0.2 01/21/2007 2134   NITRITE NEGATIVE 08/08/2020 [employer] 08/08/2020 1526    Microbiology Recent Results (from the past 240 hour(s))  Resp Panel by RT-PCR (Flu A&B, Covid) Urine, Clean Catch     Status: None   Collection Time: 08/08/20  3:29 PM   Specimen: Urine, Clean Catch; Nasopharyngeal(NP) swabs in vial transport medium  Result Value Ref Range Status   SARS Coronavirus 2 by RT PCR NEGATIVE NEGATIVE Final    Comment: (NOTE) SARS-CoV-2 target nucleic acids are NOT DETECTED.  The SARS-CoV-2 RNA is generally detectable in upper respiratory specimens during the acute phase of infection. The lowest concentration of SARS-CoV-2 viral copies this assay can detect is 138 copies/mL. A negative result does not preclude SARS-Cov-2 infection and should not be used as the sole basis for treatment or other patient management decisions. A negative result may occur with  improper specimen collection/handling, submission of specimen other than nasopharyngeal swab, presence of viral mutation(s) within  the areas targeted by this assay, and inadequate number of viral copies(<138 copies/mL). A negative result must be combined with clinical observations, patient history, and epidemiological information. The expected result is Negative.  Fact Sheet for Patients:  EntrepreneurPulse.com.au  Fact Sheet for Healthcare Providers:  IncredibleEmployment.be  This test is no t yet approved or cleared by the [place] FDA and  has been authorized for detection and/or diagnosis of SARS-CoV-2 by FDA under an Emergency Use Authorization (EUA). This EUA will  remain  in effect (meaning this test can be used) for the duration of the COVID-19 declaration under Section 564(b)(1) of the Act, 21 U.S.C.section 360bbb-3(b)(1), unless the authorization is terminated  or revoked sooner.       Influenza A by PCR NEGATIVE NEGATIVE Final   Influenza B by PCR NEGATIVE NEGATIVE Final    Comment: (NOTE) The Xpert Xpress SARS-CoV-2/FLU/RSV plus assay is intended as an aid in the diagnosis of influenza from Nasopharyngeal swab specimens and should not be used as a sole basis for treatment. Nasal washings and aspirates are unacceptable for Xpert Xpress SARS-CoV-2/FLU/RSV testing.  Fact Sheet for Patients: EntrepreneurPulse.com.au  Fact Sheet for Healthcare Providers: IncredibleEmployment.be  This test is not yet approved or cleared by the [place] FDA and has been authorized for detection and/or diagnosis of SARS-CoV-2 by FDA under an Emergency Use Authorization (EUA). This EUA will remain in effect (meaning this test can be used) for the duration of the COVID-19 declaration under Section 564(b)(1) of the Act, 21 U.S.C. section 360bbb-3(b)(1), unless the authorization is terminated or revoked.  Performed at [facility], [place] [address], [place], [place] [address]     Time coordinating discharge: Approximately 40  minutes  Patrecia Pour, MD  [facility] 08/10/2020, 11:48 AM

## 2020-08-10 NOTE — Evaluation (Signed)
Occupational Therapy Evaluation Patient Details Name: [name] MRN: [id number] DOB: 1940-08-19 Today's Date: 08/10/2020    History of Present Illness Pt is a 80 y.o. male admitted 08/08/20 with acute onset chest pain and SOB while doing yard work, also with h/o feeling dizzy when bending down, unsteady gait. Pt found to have significant HTN. CTA showed L ventricle mass. PMH includes HTN, CAD (s/p CABG), NSTEMI, stroke, vertigo.   Clinical Impression   Pt is at min guard A - sup with LB ADLs, ADL mobility using no AD and standing for self care tasks. Pt fatigued with donning socks seated EOB, reaching out for tray table and sink counter during ambulation to and from bathroom. Pt will have 24/7 assist at home for his wife and multiple family members nearby able to assist prn. Pt and wife educate don use of shower chair and energy conservation techniques. All education has been completed and no further acute OT services are indicated at this time    Follow Up Recommendations  Home health OT;Supervision - Intermittent    Equipment Recommendations  Tub/shower seat;Other (comment) (reacher, LH bath sponge)    Recommendations for Other Services       Precautions / Restrictions Precautions Precautions: Fall Precaution Comments: Watch BP Restrictions Weight Bearing Restrictions: No      Mobility Bed Mobility Overal bed mobility: Independent             General bed mobility comments: sitting EOB at arrival    Transfers Overall transfer level: Needs assistance Equipment used: None Transfers: Sit to/from Stand Sit to Stand: Supervision         General transfer comment: pt reaching out for tray table, sink counter and walls during ambulation    Balance Overall balance assessment: Needs assistance Sitting-balance support: No upper extremity supported;Feet supported Sitting balance-Leahy Scale: Normal     Standing balance support: No upper extremity supported;During  functional activity Standing balance-Leahy Scale: Fair                             ADL either performed or assessed with clinical judgement   ADL Overall ADL's : Needs assistance/impaired Eating/Feeding: Independent;Sitting   Grooming: Wash/dry hands;Wash/dry face;Supervision/safety;Standing;With caregiver independent assisting   Upper Body Bathing: Set up;Independent;With caregiver independent assisting   Lower Body Bathing: Min guard;Sit to/from stand;With caregiver independent assisting   Upper Body Dressing : Set up;Independent;Sitting;With caregiver independent assisting   Lower Body Dressing: Min guard;Sit to/from stand;With caregiver independent assisting Lower Body Dressing Details (indicate cue type and reason): pt donned socks seated EOB, fatigued slightly after this Toilet Transfer: Min guard;Supervision/safety;Ambulation;With caregiver independent assisting Toilet Transfer Details (indicate cue type and reason): min guard A initially progressing to sup Toileting- Clothing Manipulation and Hygiene: Supervision/safety;Sit to/from stand;With caregiver independent assisting   Tub/ Shower Transfer: Supervision/safety;Ambulation;Cueing for safety;Grab bars;3 in 1   Functional mobility during ADLs: Supervision/safety;Cueing for safety       Vision Baseline Vision/History: Wears glasses Patient Visual Report: No change from baseline       Perception     Praxis      Pertinent Vitals/Pain Pain Assessment: No/denies pain Pain Score: 0-No pain Faces Pain Scale: No hurt Pain Intervention(s): Monitored during session;Repositioned     Hand Dominance Right   Extremity/Trunk Assessment Upper Extremity Assessment Upper Extremity Assessment: Overall WFL for tasks assessed   Lower Extremity Assessment Lower Extremity Assessment: Defer to PT evaluation   Cervical / Trunk Assessment  Cervical / Trunk Assessment: Normal   Communication  Communication Communication: No difficulties   Cognition Arousal/Alertness: Awake/alert Behavior During Therapy: WFL for tasks assessed/performed;Anxious Overall Cognitive Status: Within Functional Limits for tasks assessed                                     General Comments  All VSS during therapy.  Pt had no c/o dizziness, fatigue, or pain.  Eduated on safety , gradual increase in activity, and having assist with harder task    Exercises     Shoulder Instructions      Home Living Family/patient expects to be discharged to:: Private residence Living Arrangements: Spouse/significant other;Children Available Help at Discharge: Family;Available 24 hours/day Type of Home: House Home Access: Stairs to enter [employer] of Steps: 2 Entrance Stairs-Rails: None Home Layout: One level     Bathroom Shower/Tub: Teacher, early years/pre: Standard     Home Equipment: Environmental consultant - 2 wheels;Cane - single point          Prior Functioning/Environment Level of Independence: Needs assistance    ADL's / Homemaking Assistance Needed: Reports indep with ADLs; typically stands to shower and leans against wall, will occasionally sit if he feels like he is going to fall. Wife and son perform 57 of household tasks            OT Problem List: Decreased activity tolerance;Decreased knowledge of use of DME or AE;Impaired balance (sitting and/or standing)      OT Treatment/Interventions:      OT Goals(Current goals can be found in the care plan section) Acute Rehab OT Goals Patient Stated Goal: Get back to working in yard OT Goal Formulation: With patient/family  OT Frequency:     Barriers to D/C:            Co-evaluation              AM-PAC OT "6 Clicks" Daily Activity     Outcome Measure Help from another person eating meals?: None Help from another person taking care of personal grooming?: A Little Help from another person  toileting, which includes using toliet, bedpan, or urinal?: A Little Help from another person bathing (including washing, rinsing, drying)?: A Little Help from another person to put on and taking off regular upper body clothing?: None Help from another person to put on and taking off regular lower body clothing?: A Little 6 Click Score: 20   End of Session Equipment Utilized During Treatment: Gait belt  Activity Tolerance: Patient limited by fatigue Patient left: in bed;with call bell/phone within reach;with family/visitor present  OT Visit Diagnosis: Other abnormalities of gait and mobility (R26.89);Muscle weakness (generalized) (M62.81)                Time: 9191-6606 OT Time Calculation (min): 24 min Charges:  OT General Charges $OT Visit: 1 Visit OT Evaluation $OT Eval Low Complexity: 1 Low OT Treatments $Self Care/Home Management : 8-22 mins   Britt Bottom 08/10/2020, 1:02 PM

## 2020-08-10 NOTE — Telephone Encounter (Signed)
Called and spoke with patient who gave permission for me to speak with his daughter regarding his all/any of his care.  Adv to fill out DPR when he comes to next appointment.  Spoke w pt's daughter, Kalman Shan who is planning to come into town to check on pt but wont be here for his f/u.  I adv we could phone her into the visit.  She said they did this before w Dr. Burt Knack and it worked great.  She asks if cardiac MRI can be ordered ahead of the follow up visit.    I adv it can probably wait until he is seen, in case it is determined it is not needed, but that I would send to Dr. Burt Knack to check if wants to review chart and order cardiac MRI now (per hosp dc).    She voices understanding and is aware that Dr. Burt Knack is away this week and he will review upon return.

## 2020-08-10 NOTE — Progress Notes (Signed)
Cardiology Progress Note  Patient ID: [name] MRN: [id number] DOB: 1940/03/08 Date of Encounter: 08/10/2020  Primary Cardiologist: Sherren Mocha, MD  Subjective   Chief Complaint: None.  HPI: No further chest pain symptoms.  Stress test normal.  Echocardiogram unchanged.  MRI can be pursued as an outpatient.  ROS:  All other ROS reviewed and negative. Pertinent positives noted in the HPI.     Inpatient Medications  Scheduled Meds:  acetaminophen  325 mg Oral Daily   aspirin EC  81 mg Oral Daily   carvedilol  12.5 mg Oral BID   clopidogrel  75 mg Oral Daily   enoxaparin (LOVENOX) injection  30 mg Subcutaneous Q24H   irbesartan  300 mg Oral Daily   pantoprazole  40 mg Oral Daily   rosuvastatin  40 mg Oral Daily   Continuous Infusions:  PRN Meds: acetaminophen, hydrALAZINE, ondansetron (ZOFRAN) IV   Vital Signs   Vitals:   08/09/20 1532 08/09/20 1900 08/09/20 1939 08/10/20 0438  BP: (!) 142/81  131/77 140/86  Pulse: 61  73 60  Resp:   18 16  Temp: 97.9 F (36.6 C) 97.9 F (36.6 C) 97.9 F (36.6 C) 98.2 F (36.8 C)  TempSrc: Oral Oral Oral Oral  SpO2:   98% 99%  Weight:      Height:        Intake/Output Summary (Last 24 hours) at 08/10/2020 0819 Last data filed at 08/09/2020 0900 Gross per 24 hour  Intake --  Output 200 ml  Net -200 ml   Last 3 Weights 08/08/2020 05/31/2020 05/25/2020  Weight (lbs) 179 lb 4.8 oz 181 lb 176 lb  Weight (kg) 81.33 kg 82.101 kg 79.833 kg      Telemetry  Overnight telemetry shows sinus rhythm in the 80s, which I personally reviewed.   Physical Exam   Vitals:   08/09/20 1532 08/09/20 1900 08/09/20 1939 08/10/20 0438  BP: (!) 142/81  131/77 140/86  Pulse: 61  73 60  Resp:   18 16  Temp: 97.9 F (36.6 C) 97.9 F (36.6 C) 97.9 F (36.6 C) 98.2 F (36.8 C)  TempSrc: Oral Oral Oral Oral  SpO2:   98% 99%  Weight:      Height:        Intake/Output Summary (Last 24 hours) at 08/10/2020 0819 Last data filed at  08/09/2020 0900 Gross per 24 hour  Intake --  Output 200 ml  Net -200 ml    Last 3 Weights 08/08/2020 05/31/2020 05/25/2020  Weight (lbs) 179 lb 4.8 oz 181 lb 176 lb  Weight (kg) 81.33 kg 82.101 kg 79.833 kg    Body mass index is 29.84 kg/m.   General: Well nourished, well developed, in no acute distress Head: Atraumatic, normal size  Eyes: PEERLA, EOMI  Neck: Supple, no JVD Endocrine: No thryomegaly Cardiac: Normal S1, S2; RRR; no murmurs, rubs, or gallops Lungs: Clear to auscultation bilaterally, no wheezing, rhonchi or rales  Abd: Soft, nontender, no hepatomegaly  Ext: No edema, pulses 2+ Musculoskeletal: No deformities, BUE and BLE strength normal and equal Skin: Warm and dry, no rashes   Neuro: Alert and oriented to person, place, time, and situation, CNII-XII grossly intact, no focal deficits  Psych: Normal mood and affect   Labs  High Sensitivity Troponin:   Recent Labs  Lab 08/08/20 1526 08/08/20 1726  TROPONINIHS 7 6     Cardiac EnzymesNo results for input(s): TROPONINI in the last 168 hours. No results for input(s): TROPIPOC in  the last 168 hours.  Chemistry Recent Labs  Lab 08/08/20 1526 08/08/20 1614  NA 136 141  K 3.9 3.8  CL 103 104  CO2 26  --   GLUCOSE 96 94  BUN 14 16  CREATININE 0.91 0.90  CALCIUM 9.2  --   PROT 7.1  --   ALBUMIN 3.4*  --   AST 19  --   ALT 13  --   ALKPHOS 72  --   BILITOT 0.6  --   GFRNONAA >60  --   ANIONGAP 7  --     Hematology Recent Labs  Lab 08/08/20 1526 08/08/20 1614  WBC 6.7  --   RBC 4.86  --   HGB 14.0 14.6  HCT 44.8 43.0  MCV 92.2  --   MCH 28.8  --   MCHC 31.3  --   RDW 14.0  --   PLT 212  --    BNPNo results for input(s): BNP, PROBNP in the last 168 hours.  DDimer No results for input(s): DDIMER in the last 168 hours.   Radiology  DG Chest 2 View  Result Date: 08/08/2020 CLINICAL DATA:  Chest pain EXAM: CHEST - 2 VIEW COMPARISON:  05/09/2018 FINDINGS: Post sternotomy changes. No focal opacity or  pleural effusion. Stable cardiomediastinal silhouette with aortic atherosclerosis. No pneumothorax. IMPRESSION: No active cardiopulmonary disease. Electronically Signed   By: Donavan Foil M.D.   On: 08/08/2020 16:07   CT Head Wo Contrast  Result Date: 08/08/2020 CLINICAL DATA:  Trauma EXAM: CT HEAD WITHOUT CONTRAST CT CERVICAL SPINE WITHOUT CONTRAST TECHNIQUE: Multidetector CT imaging of the head and cervical spine was performed following the standard protocol without intravenous contrast. Multiplanar CT image reconstructions of the cervical spine were also generated. COMPARISON:  CT brain, 11/04/2017 FINDINGS: CT HEAD FINDINGS Brain: No evidence of acute infarction, hemorrhage, hydrocephalus, extra-axial collection or mass lesion/mass effect. Periventricular and deep white matter hypodensity. Vascular: No hyperdense vessel or unexpected calcification. Skull: Normal. Negative for fracture or focal lesion. Sinuses/Orbits: Opacification of the right frontal sinus. Partial opacification of the ethmoid air cells. Polyps and/or mucous retention cysts of the right nasal cavity (series 4, image 25). Other: None. CT CERVICAL SPINE FINDINGS Alignment: Normal. Skull base and vertebrae: No acute fracture. No primary bone lesion or focal pathologic process. Soft tissues and spinal canal: No prevertebral fluid or swelling. No visible canal hematoma. Disc levels: Mild to moderate disc space height loss and osteophytosis, worst at C6-C7. Upper chest: Negative. Other: None. IMPRESSION: 1. No acute intracranial pathology. 2. Small-vessel white matter disease. 3. Opacification of the right frontal sinus. Partial opacification of the ethmoid air cells. Polyps and/or mucous retention cysts of the right nasal cavity. Correlate for paranasal sinus disease. 4. No fracture or static subluxation of the cervical spine. 5. Mild to moderate multilevel cervical disc degenerative disease. Electronically Signed   By: Eddie Candle M.D.   On:  08/08/2020 17:02   CT Cervical Spine Wo Contrast  Result Date: 08/08/2020 CLINICAL DATA:  Trauma EXAM: CT HEAD WITHOUT CONTRAST CT CERVICAL SPINE WITHOUT CONTRAST TECHNIQUE: Multidetector CT imaging of the head and cervical spine was performed following the standard protocol without intravenous contrast. Multiplanar CT image reconstructions of the cervical spine were also generated. COMPARISON:  CT brain, 11/04/2017 FINDINGS: CT HEAD FINDINGS Brain: No evidence of acute infarction, hemorrhage, hydrocephalus, extra-axial collection or mass lesion/mass effect. Periventricular and deep white matter hypodensity. Vascular: No hyperdense vessel or unexpected calcification. Skull: Normal. Negative for fracture  or focal lesion. Sinuses/Orbits: Opacification of the right frontal sinus. Partial opacification of the ethmoid air cells. Polyps and/or mucous retention cysts of the right nasal cavity (series 4, image 25). Other: None. CT CERVICAL SPINE FINDINGS Alignment: Normal. Skull base and vertebrae: No acute fracture. No primary bone lesion or focal pathologic process. Soft tissues and spinal canal: No prevertebral fluid or swelling. No visible canal hematoma. Disc levels: Mild to moderate disc space height loss and osteophytosis, worst at C6-C7. Upper chest: Negative. Other: None. IMPRESSION: 1. No acute intracranial pathology. 2. Small-vessel white matter disease. 3. Opacification of the right frontal sinus. Partial opacification of the ethmoid air cells. Polyps and/or mucous retention cysts of the right nasal cavity. Correlate for paranasal sinus disease. 4. No fracture or static subluxation of the cervical spine. 5. Mild to moderate multilevel cervical disc degenerative disease. Electronically Signed   By: Eddie Candle M.D.   On: 08/08/2020 17:02   NM Myocar Multi W/Spect Tamela Oddi Motion / EF  Result Date: 08/09/2020 CLINICAL DATA:  Chest pain. History of previous median sternotomy and CABG. EXAM: MYOCARDIAL  IMAGING WITH SPECT (REST AND PHARMACOLOGIC-STRESS) GATED LEFT VENTRICULAR WALL MOTION STUDY LEFT VENTRICULAR EJECTION FRACTION TECHNIQUE: Standard myocardial SPECT imaging was performed after resting intravenous injection of 10 mCi Tc-51m tetrofosmin. Subsequently, intravenous infusion of Lexiscan was performed under the supervision of the Cardiology staff. At peak effect of the drug, 30 mCi Tc-52m tetrofosmin was injected intravenously and standard myocardial SPECT imaging was performed. Quantitative gated imaging was also performed to evaluate left ventricular wall motion, and estimate left ventricular ejection fraction. COMPARISON:  Chest radiograph-08/08/2020; CT the chest, abdomen and pelvis-08/08/2020 FINDINGS: Raw images: Mild breast and chest wall attenuation is seen on both provided rest and stress images. Mild GI attenuation is seen, worse on the provided stress images. Mild patient motion artifact is seen, worse on the provided rest images. Perfusion: No decreased activity in the left ventricle on stress imaging to suggest reversible ischemia or infarction. Wall Motion: Normal left ventricular wall motion. No left ventricular dilation. Left Ventricular Ejection Fraction: 58 % End diastolic volume 70 ml End systolic volume 29 ml IMPRESSION: 1. No scintigraphic evidence of prior infarction or pharmacologically induced ischemia. 2. Normal left ventricular wall motion. 3. Left ventricular ejection fraction 58% 4. Non invasive risk stratification*: Low *2012 Appropriate Use Criteria for Coronary Revascularization Focused Update: J Am Coll Cardiol. 8466;59(9):357-017. http://content.airportbarriers.com.aspx?articleid=[email or web address] Electronically Signed   By: Sandi Mariscal M.D.   On: 08/09/2020 14:53   ECHOCARDIOGRAM COMPLETE  Result Date: 08/09/2020    ECHOCARDIOGRAM REPORT   Patient Name:   [name] Date of Exam: 08/09/2020 Medical Rec #:  [id number]        Height:       65.0 in Accession #:    [id number]        Weight:       179.3 lb Date of Birth:  11-28-1940        BSA:          1.888 m Patient Age:    80 years         BP:           155/109 mmHg Patient Gender: M                HR:           64 bpm. Exam Location:  Inpatient Procedure: 2D Echo, Cardiac Doppler and Color Doppler Indications:    R07.9* Chest pain, unspecified  History:  Patient has prior history of Echocardiogram examinations, most                 recent 08/26/2019.  Sonographer:    Merrie Roof RDCS Referring Phys: [id number] [place]  1. Left ventricular ejection fraction, by estimation, is 60 to 65%. The left ventricle has normal function. The left ventricle has no regional wall motion abnormalities. There is mild left ventricular hypertrophy. Left ventricular diastolic parameters are consistent with Grade II diastolic dysfunction (pseudonormalization). Elevated left atrial pressure.  2. Right ventricular systolic function is normal. The right ventricular size is normal.  3. Left atrial size was moderately dilated.  4. The mitral valve is normal in structure. Trivial mitral valve regurgitation. No evidence of mitral stenosis.  5. The aortic valve is tricuspid. Aortic valve regurgitation is trivial. Mild aortic valve sclerosis is present, with no evidence of aortic valve stenosis.  6. The inferior vena cava is normal in size with greater than 50% respiratory variability, suggesting right atrial pressure of 3 mmHg. FINDINGS  Left Ventricle: Left ventricular ejection fraction, by estimation, is 60 to 65%. The left ventricle has normal function. The left ventricle has no regional wall motion abnormalities. The left ventricular internal cavity size was normal in size. There is  mild left ventricular hypertrophy. Left ventricular diastolic parameters are consistent with Grade II diastolic dysfunction (pseudonormalization). Elevated left atrial pressure. Right Ventricle: The right ventricular size is normal. Right ventricular systolic  function is normal. Left Atrium: Left atrial size was moderately dilated. Right Atrium: Right atrial size was normal in size. Pericardium: There is no evidence of pericardial effusion. Mitral Valve: The mitral valve is normal in structure. Mild mitral annular calcification. Trivial mitral valve regurgitation. No evidence of mitral valve stenosis. Tricuspid Valve: The tricuspid valve is normal in structure. Tricuspid valve regurgitation is trivial. No evidence of tricuspid stenosis. Aortic Valve: The aortic valve is tricuspid. Aortic valve regurgitation is trivial. Mild aortic valve sclerosis is present, with no evidence of aortic valve stenosis. Aortic valve mean gradient measures 5.0 mmHg. Aortic valve peak gradient measures 8.3 mmHg. Aortic valve area, by VTI measures 2.60 cm. Pulmonic Valve: The pulmonic valve was not well visualized. Pulmonic valve regurgitation is not visualized. No evidence of pulmonic stenosis. Aorta: The aortic root is normal in size and structure. Venous: The inferior vena cava is normal in size with greater than 50% respiratory variability, suggesting right atrial pressure of 3 mmHg. IAS/Shunts: No atrial level shunt detected by color flow Doppler.  LEFT VENTRICLE PLAX 2D LVIDd:         4.30 cm  Diastology LVIDs:         2.80 cm  LV e' medial:    5.55 cm/s LV PW:         1.20 cm  LV E/e' medial:  20.0 LV IVS:        1.20 cm  LV e' lateral:   6.85 cm/s LVOT diam:     2.00 cm  LV E/e' lateral: 16.2 LV SV:         88 LV SV Index:   47 LVOT Area:     3.14 cm  RIGHT VENTRICLE RV Basal diam:  3.90 cm LEFT ATRIUM             Index       RIGHT ATRIUM           Index LA diam:        5.30 cm 2.81 cm/m  RA Area:     24.90 cm LA Vol (A2C):   87.7 ml 46.44 ml/m RA Volume:   72.50 ml  38.39 ml/m LA Vol (A4C):   76.4 ml 40.46 ml/m LA Biplane Vol: 83.5 ml 44.22 ml/m  AORTIC VALVE AV Area (Vmax):    2.53 cm AV Area (Vmean):   2.59 cm AV Area (VTI):     2.60 cm AV Vmax:           144.00 cm/s AV  Vmean:          101.000 cm/s AV VTI:            0.338 m AV Peak Grad:      8.3 mmHg AV Mean Grad:      5.0 mmHg LVOT Vmax:         116.00 cm/s LVOT Vmean:        83.200 cm/s LVOT VTI:          0.280 m LVOT/AV VTI ratio: 0.83  AORTA Ao Root diam: 3.50 cm MITRAL VALVE                TRICUSPID VALVE MV Area (PHT): 3.42 cm     TR Peak grad:   27.5 mmHg MV Decel Time: 222 msec     TR Vmax:        262.00 cm/s MV E velocity: 111.00 cm/s MV A velocity: 83.60 cm/s   SHUNTS MV E/A ratio:  1.33         Systemic VTI:  0.28 m                             Systemic Diam: 2.00 cm Kirk Ruths MD Electronically signed by Kirk Ruths MD Signature Date/Time: 08/09/2020/2:20:23 PM    Final    CT Angio Chest/Abd/Pel for Dissection W and/or Wo Contrast  Result Date: 08/08/2020 CLINICAL DATA:  Low back and leg pain, aortic dissection suspected EXAM: CT ANGIOGRAPHY CHEST, ABDOMEN AND PELVIS TECHNIQUE: Non-contrast CT of the chest was initially obtained. Multidetector CT imaging through the chest, abdomen and pelvis was performed using the standard protocol during bolus administration of intravenous contrast. Multiplanar reconstructed images and MIPs were obtained and reviewed to evaluate the vascular anatomy. CONTRAST:  147mL OMNIPAQUE IOHEXOL 350 MG/ML SOLN COMPARISON:  None. FINDINGS: CTA CHEST FINDINGS Cardiovascular: Preferential opacification of the thoracic aorta. Normal contour and caliber of the thoracic aorta. No evidence of aneurysm, dissection, or other acute aortic syndrome. Moderate mixed calcific atherosclerosis. Normal heart size. Three-vessel coronary artery calcifications status post CABG. There is an unusual, focal thickening and or abnormal soft tissue about the lateral wall of the left ventricle, measuring approximately 8.1 x 2.2 cm (series 6, image 102). Mediastinum/Nodes: No enlarged mediastinal, hilar, or axillary lymph nodes. Moderate hiatal hernia with intrathoracic position of the gastric fundus. Thyroid  gland, trachea, and esophagus demonstrate no significant findings. Lungs/Pleura: Minimal dependent bibasilar atelectasis or scarring. No pleural effusion or pneumothorax. Musculoskeletal: No chest wall abnormality. No acute or significant osseous findings. Review of the MIP images confirms the above findings. CTA ABDOMEN AND PELVIS FINDINGS VASCULAR Normal contour and caliber of the abdominal aorta. No evidence of aneurysm, dissection, or other acute aortic pathology. There is standard branching pattern of the abdominal aorta, with solitary bilateral renal arteries. Review of the MIP images confirms the above findings. NON-VASCULAR Hepatobiliary: No solid liver abnormality is seen. No gallstones, gallbladder wall thickening, or biliary dilatation. Pancreas:  Unremarkable. No pancreatic ductal dilatation or surrounding inflammatory changes. Spleen: Normal in size without significant abnormality. Adrenals/Urinary Tract: Adrenal glands are unremarkable. Kidneys are normal, without renal calculi, solid lesion, or hydronephrosis. Bladder is unremarkable. Stomach/Bowel: Stomach is within normal limits. Appendix appears normal. No evidence of bowel wall thickening, distention, or inflammatory changes. Descending and sigmoid diverticulosis. Lymphatic: No enlarged abdominal or pelvic lymph nodes. Reproductive: Prostatomegaly. Other: No abdominal wall hernia or abnormality. No abdominopelvic ascites. Musculoskeletal: No acute or significant osseous findings. Review of the MIP images confirms the above findings. IMPRESSION: 1. Normal contour and caliber of the thoracic and abdominal aorta. No evidence of aneurysm, dissection, or other acute aortic pathology. Moderate aortic atherosclerosis. 2. There is an unusual, focal pericardial thickening and/or abnormal soft tissue about the lateral wall of the left ventricle, measuring approximately 8.1 x 2.2 cm. This is of uncertain nature, slightly increased compared to prior  examination dated 2019, at which time it was described as pericardial effusion, however it is clearly soft tissue attenuation on current examination and not consistent with effusion. Cardiac MRI performed on a nonemergent basis may be helpful to more clearly assess nature and etiology of this finding. 3. Moderate hiatal hernia with intrathoracic position of the gastric fundus. 4. Coronary artery disease. 5. Diverticulosis without evidence of diverticulitis. 6. Prostatomegaly. Electronically Signed   By: Eddie Candle M.D.   On: 08/08/2020 17:10    Cardiac Studies  NM Stress 08/09/2020  IMPRESSION: 1. No scintigraphic evidence of prior infarction or pharmacologically induced ischemia.   2. Normal left ventricular wall motion.   3. Left ventricular ejection fraction 58%   4. Non invasive risk stratification*: Low  CTA 08/08/2020 IMPRESSION: 1. Normal contour and caliber of the thoracic and abdominal aorta. No evidence of aneurysm, dissection, or other acute aortic pathology. Moderate aortic atherosclerosis. 2. There is an unusual, focal pericardial thickening and/or abnormal soft tissue about the lateral wall of the left ventricle, measuring approximately 8.1 x 2.2 cm. This is of uncertain nature, slightly increased compared to prior examination dated 2019, at which time it was described as pericardial effusion, however it is clearly soft tissue attenuation on current examination and not consistent with effusion. Cardiac MRI performed on a nonemergent basis may be helpful to more clearly assess nature and etiology of this finding. 3. Moderate hiatal hernia with intrathoracic position of the gastric fundus. 4. Coronary artery disease. 5. Diverticulosis without evidence of diverticulitis. 6. Prostatomegaly.    Patient Profile  Shankar Silber is a 80 y.o. male with history of CAD status post CABG in 2019 who was admitted on 08/08/2020 with sharp atypical chest pain.  Assessment & Plan    Sharp, atypical chest pain -Suspect his symptoms are acid reflux related.  We will start Protonix. -Nuclear medicine stress test is normal. -I have reviewed his CT scans.  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  This was present on his CTA in 2019.  This has been there for at least 3 years.  Cardiac MRI can be pursued as an outpatient.  This does not need to keep him in the hospital.  His symptoms have resolved.  They are not related to this.  2.  Focal pericardial thickening in the posterior lateral aspect of the pericardial space -Suspect this is a loculated effusion related to prior bypass surgery.  Was present on his CTA in 2019. -He can be discharged and pursue a outpatient cardiac MRI.  This does  not explain his symptoms. -Echocardiogram did not visualize this well.  [facility] will sign off.   Medication Recommendations: Protonix as above.  Continue home medications. Other recommendations (labs, testing, etc): He will pursue outpatient cardiac MRI Follow up as an outpatient: We will arrange hospital follow-up with Dr. Burt Knack  For questions or updates, please contact [facility] Please consult [email or web address] for contact info under   Time Spent with Patient: I have spent a total of 35 minutes with patient reviewing hospital notes, telemetry, EKGs, labs and examining the patient as well as establishing an assessment and plan that was discussed with the patient.  > 50% of time was spent in direct patient care.    Signed, Addison Naegeli. Audie Box, MD, [place]  08/10/2020 8:19 AM

## 2020-08-15 NOTE — Telephone Encounter (Signed)
Thanks for the message. Reviewed hospital notes. Cardiac MRI should be done to assess pericardial mass which might be a loculated effusion. OK to do this prior to office visit if possible. Thank you

## 2020-08-16 ENCOUNTER — Telehealth: Payer: Self-pay | Admitting: Emergency Medicine

## 2020-08-16 ENCOUNTER — Other Ambulatory Visit: Payer: Self-pay | Admitting: Cardiovascular Disease

## 2020-08-16 DIAGNOSIS — M545 Low back pain, unspecified: Secondary | ICD-10-CM | POA: Diagnosis not present

## 2020-08-16 DIAGNOSIS — I5032 Chronic diastolic (congestive) heart failure: Secondary | ICD-10-CM | POA: Diagnosis not present

## 2020-08-16 DIAGNOSIS — M479 Spondylosis, unspecified: Secondary | ICD-10-CM | POA: Diagnosis not present

## 2020-08-16 DIAGNOSIS — I700 Atherosclerosis of aorta: Secondary | ICD-10-CM | POA: Diagnosis not present

## 2020-08-16 DIAGNOSIS — I25119 Atherosclerotic heart disease of native coronary artery with unspecified angina pectoris: Secondary | ICD-10-CM | POA: Diagnosis not present

## 2020-08-16 DIAGNOSIS — I110 Hypertensive heart disease with heart failure: Secondary | ICD-10-CM | POA: Diagnosis not present

## 2020-08-16 DIAGNOSIS — I083 Combined rheumatic disorders of mitral, aortic and tricuspid valves: Secondary | ICD-10-CM | POA: Diagnosis not present

## 2020-08-16 DIAGNOSIS — G8929 Other chronic pain: Secondary | ICD-10-CM | POA: Diagnosis not present

## 2020-08-16 DIAGNOSIS — M170 Bilateral primary osteoarthritis of knee: Secondary | ICD-10-CM | POA: Diagnosis not present

## 2020-08-16 NOTE — Telephone Encounter (Signed)
[place] Name: [facility] Agency Name: Santina Evans Phone #: [phone]- [phone] LVM  Service Requested: PT  (examples: OT/PT/Skilled Nursing/Social Work/Speech Therapy/Wound Care) Frequency of Visits: 2w4, 1w5  Clair Gulling said that there are several medications missing from the patients home. Patient informed him that [facility] should be delivering them soon.  Meds Missing:  Plavix  Lasix  Crestor  Valsartan    Clair Gulling is also requesting an OT eval and medical social worker and nursing. Please advise   [place] Concerns- chronic low back pain severe for a few months, recently left shin pain, chronically poor memory, no feeling in arms and legs, incontinent for urine.   Equipment that would be good for the patient, rolling walker and a shower chair.

## 2020-08-17 ENCOUNTER — Other Ambulatory Visit: Payer: Self-pay | Admitting: Emergency Medicine

## 2020-08-17 ENCOUNTER — Telehealth: Payer: Self-pay | Admitting: Emergency Medicine

## 2020-08-17 DIAGNOSIS — I2583 Coronary atherosclerosis due to lipid rich plaque: Secondary | ICD-10-CM

## 2020-08-17 DIAGNOSIS — E1169 Type 2 diabetes mellitus with other specified complication: Secondary | ICD-10-CM

## 2020-08-17 DIAGNOSIS — I700 Atherosclerosis of aorta: Secondary | ICD-10-CM

## 2020-08-17 DIAGNOSIS — R2689 Other abnormalities of gait and mobility: Secondary | ICD-10-CM

## 2020-08-17 DIAGNOSIS — I5032 Chronic diastolic (congestive) heart failure: Secondary | ICD-10-CM

## 2020-08-17 DIAGNOSIS — I25118 Atherosclerotic heart disease of native coronary artery with other forms of angina pectoris: Secondary | ICD-10-CM

## 2020-08-17 DIAGNOSIS — Z9181 History of falling: Secondary | ICD-10-CM

## 2020-08-17 DIAGNOSIS — I110 Hypertensive heart disease with heart failure: Secondary | ICD-10-CM

## 2020-08-17 DIAGNOSIS — R269 Unspecified abnormalities of gait and mobility: Secondary | ICD-10-CM

## 2020-08-17 DIAGNOSIS — R2681 Unsteadiness on feet: Secondary | ICD-10-CM

## 2020-08-17 DIAGNOSIS — R29898 Other symptoms and signs involving the musculoskeletal system: Secondary | ICD-10-CM

## 2020-08-17 DIAGNOSIS — I2510 Atherosclerotic heart disease of native coronary artery without angina pectoris: Secondary | ICD-10-CM

## 2020-08-17 NOTE — Telephone Encounter (Signed)
[name] from [facility] called   Reported patient is taking Ibuprofen in addition to medication added in his last hospital visit. Level 2 reaction with Plavix.   Best contact #: [phone]

## 2020-08-17 NOTE — Telephone Encounter (Signed)
CMM and PT ordered

## 2020-08-18 ENCOUNTER — Ambulatory Visit (INDEPENDENT_AMBULATORY_CARE_PROVIDER_SITE_OTHER): Payer: Medicare HMO

## 2020-08-18 ENCOUNTER — Other Ambulatory Visit: Payer: Self-pay

## 2020-08-18 ENCOUNTER — Telehealth: Payer: Self-pay | Admitting: *Deleted

## 2020-08-18 VITALS — BP 118/60 | HR 63 | Temp 98.1°F | Ht 67.0 in | Wt 177.6 lb

## 2020-08-18 DIAGNOSIS — Z0000 Encounter for general adult medical examination without abnormal findings: Secondary | ICD-10-CM | POA: Diagnosis not present

## 2020-08-18 NOTE — Progress Notes (Signed)
Subjective:   [name] is a 80 y.o. male who presents for Medicare Annual/Subsequent preventive examination.  Review of Systems     Cardiac Risk Factors include: advanced age (>48mn, >>59women);diabetes mellitus;dyslipidemia;family history of premature cardiovascular disease;hypertension;male gender     Objective:    Today's Vitals   08/18/20 1507  BP: 118/60  Pulse: 63  Temp: 98.1 F (36.7 C)  SpO2: 96%  Weight: 177 lb 9.6 oz (80.6 kg)  Height: _0  (1.702 m)  PainSc: 8    Body mass index is 27.82 kg/m.  Advanced Directives 08/08/2020 11/05/2017 11/04/2017 04/14/2017 05/03/2016 05/02/2016 04/17/2012  Does Patient Have a Medical Advance Directive? No - No No No No Patient does not have advance directive;Patient would like information  Would patient like information on creating a medical advance directive? No - Patient declined No - Patient declined - No - Patient declined No - Patient declined - -  Pre-existing out of facility DNR order (yellow form or pink MOST form) - - - - - - No    Current Medications (verified) Outpatient Encounter Medications as of 08/18/2020  Medication Sig   acetaminophen (TYLENOL) 325 MG tablet Take 325 mg by mouth daily.   aspirin EC 81 MG tablet Take 81 mg by mouth daily.   pantoprazole (PROTONIX) 40 MG tablet Take 1 tablet (40 mg total) by mouth daily.   valsartan (DIOVAN) 320 MG tablet TAKE 1 TABLET EVERY DAY   carvedilol (COREG) 12.5 MG tablet Take 1 tablet (12.5 mg total) by mouth 2 (two) times daily.   clopidogrel (PLAVIX) 75 MG tablet Take 1 tablet (75 mg total) by mouth daily.   furosemide (LASIX) 40 MG tablet Take 1 tablet (40 mg total) by mouth every other day.   potassium chloride SA (KLOR-CON) 20 MEQ tablet Take 1 tablet (20 mEq total) by mouth every other day.   rosuvastatin (CRESTOR) 40 MG tablet Take 1 tablet (40 mg total) by mouth daily.   No facility-administered encounter medications on file as of 08/18/2020.    Allergies  (verified) Lipitor [atorvastatin], Morphine and related, and Pork-derived products   History: Past Medical History:  Diagnosis Date   Blurred vision 05/17/2016   CAD (coronary artery disease), native coronary artery 06/14/2008   a. BMS to LCx & OM 2008 b. 03/2017 CABG x 4 (LIMA to LAD, SVG to diag 1, SVG to distal Circ, SVG to PDA)   Chest pain 04/16/2012   Diplopia    Echocardiogram    Echo 10/19: mod LVH, EF 55-60, no RWMA, Gr 1 DD, trivial AI, MAC, mod LAE, prob small post effusion   Essential hypertension 05/17/2016   Hyperlipidemia with target low density lipoprotein (LDL) cholesterol less than 70 mg/dL 06/14/2008   Qualifier: Diagnosis of  By: FMare Ferrari RMA, [name]     Hypertension    Hypertensive heart disease 06/14/2008   Qualifier: Diagnosis of  By: FMare Ferrari RMA, [name]     Hypertensive urgency, malignant 04/16/2012   Hypokalemia 04/14/2017   Internal hemorrhoids 04/19/2012   Ischemic stroke ([facility] 05/02/2016   Non-ST elevation (NSTEMI) myocardial infarction ([facility]    pandiverticulosis 04/22/2012   [place]. [place] [facility] JJuanita CraverMD. Colonoscopy. Moderate sized internal hemorrhoids and extensive pandiverticulosis. Repeat 5 years.     Pericardial effusion    Echo 11/19: mild focal basal septal hypertrophy, EF 55-60, Gr 2 DD, trivial AI, trivial TR, trivial PI, small to mod eff post to heart - no evidence of RV collapse.>> repeat limited  echo in 03/2018 // Echo 03/2018: EF 55-60, mild LVH, +diastolic dysfunction, no RWMA, PASP 23, trivial pericardial effusion    Polycythemia vera(238.4) 04/17/2012   S/P CABG x 4 04/17/2017   Syncope and collapse 04/17/2012   Pt syncopized while sitting in bed giving history @ time of admission to hospital Tachycardic (appeared sinus) to 120s-130s and hypotensive to 50s/30s.  Unresponsive initially >> Spontaneously resolved after 2-3 minutes >> return to baseline ~30 minutes    Vertigo    Past Surgical History:  Procedure Laterality Date    CORONARY ARTERY BYPASS GRAFT N/A 04/17/2017   Procedure: CORONARY ARTERY BYPASS GRAFTING (CABG) x4 , using left internal mammary artery  to LAD and right leg greater saphenous vein harvested endoscopically  to PDA, Diagonal I and Circumflex;  Surgeon: [name], MD;  Location: [facility];  Service: Open Heart Surgery;  Laterality: N/A;   CORONARY ARTERY BYPASS GRAFT  2020   CORONARY STENT PLACEMENT     LEFT HEART CATH AND CORONARY ANGIOGRAPHY N/A 04/16/2017   Procedure: LEFT HEART CATH AND CORONARY ANGIOGRAPHY;  Surgeon: Sherren Mocha, MD;  Location: [facility];  Service: Cardiovascular;  Laterality: N/A;   TEE WITHOUT CARDIOVERSION N/A 04/17/2017   Procedure: TRANSESOPHAGEAL ECHOCARDIOGRAM (TEE);  Surgeon: [name], MD;  Location: [facility];  Service: Open Heart Surgery;  Laterality: N/A;   Family History  Problem Relation Age of Onset   Heart disease Mother    Cancer Sister    Social History   Socioeconomic History   Marital status: Married    Spouse name: Not on file   Number of children: Not on file   Years of education: Not on file   Highest education level: Not on file  Occupational History   Not on file  Tobacco Use   Smoking status: Never   Smokeless tobacco: Never  Vaping Use   Vaping Use: Never used  Substance and Sexual Activity   Alcohol use: No    Alcohol/week: 0.0 standard drinks   Drug use: No   Sexual activity: Not on file  Other Topics Concern   Not on file  Social History Narrative   Lives in [place] with Wife and 2 sons.  From [place].  To [place] ~2000.     Currently retired but worked in [place] Strain: Low Risk    Difficulty of Paying Living Expenses: Not hard at all  Food Insecurity: No Food Insecurity   Worried About Charity fundraiser in the Last Year: Never true   Arboriculturist in the Last Year: Never true  Transportation Needs: No Transportation Needs    Lack of Transportation (Medical): No   Lack of Transportation (Non-Medical): No  Physical Activity: Inactive   Days of Exercise per Week: 0 days   Minutes of Exercise per Session: 0 min  Stress: No Stress Concern Present   Feeling of Stress : Not at all  Social Connections: Socially Integrated   Frequency of Communication with Friends and Family: More than three times a week   Frequency of Social Gatherings with Friends and Family: Three times a week   Attends Religious Services: More than 4 times per year   Active Member of Clubs or Organizations: No   Attends Music therapist: More than 4 times per year   Marital Status: Married    Tobacco Counseling Counseling given: Not Answered   Clinical  Intake:  Pre-visit preparation completed: Yes  Pain : 0-10 Pain Score: 8  Pain Type: Chronic pain Pain Location: Back Pain Orientation: Lower Pain Descriptors / Indicators: Discomfort, Dull, Constant, Throbbing Pain Onset: More than a month ago Pain Frequency: Constant Pain Relieving Factors: Tylenol 568m (2 tablets) Effect of Pain on Daily Activities: Pain produces disability and affects the quality of life.  Pain Relieving Factors: Tylenol 5061m(2 tablets)  BMI - recorded: 27.82 Nutritional Status: BMI 25 -29 Overweight Nutritional Risks: None Diabetes: Yes CBG done?: No Did pt. bring in CBG monitor from home?: No  How often do you need to have someone help you when you read instructions, pamphlets, or other written materials from your doctor or pharmacy?: 1 - Never What is the last grade level you completed in school?: High School  Diabetic? yes  Interpreter Needed?: No  Information entered by :: ShLisette AbuLPN   Activities of Daily Living In your present state of health, do you have any difficulty performing the following activities: 08/18/2020 08/08/2020  Hearing? N N  Vision? N N  Difficulty concentrating or making decisions? Y Tempie DonningWalking or  climbing stairs? Y Y  Dressing or bathing? N Y  Doing errands, shopping? Y Tempie DonningPreparing Food and eating ? N -  Using the Toilet? N -  In the past six months, have you accidently leaked urine? N -  Do you have problems with loss of bowel control? N -  Managing your Medications? N -  Managing your Finances? N -  Housekeeping or managing your Housekeeping? N -  Some recent data might be hidden    Patient Care Team: SaHorald PollenMD as PCP - General (Internal Medicine) CoSherren MochaMD as PCP - Cardiology (Cardiology) LaRobyn HaberMD (Family Medicine)  Indicate any recent Medical Services you may have received from other than [facility] providers in the past year (date may be approximate).     Assessment:   This is a routine wellness examination for Parnell.  Hearing/Vision screen No results found.  Dietary issues and exercise activities discussed: Current Exercise Habits: The patient does not participate in regular exercise at present, Exercise limited by: cardiac condition(s);orthopedic condition(s)   Goals Addressed               This Visit's Progress     Patient Stated (pt-stated)        My goal is to eliminate my back pain.       Depression Screen PHQ 2/9 Scores 08/18/2020 05/31/2020 05/09/2018 11/12/2017 10/28/2017 07/09/2017 06/11/2017  PHQ - 2 Score 0 3 0 0 0 0 0  PHQ- 9 Score - - 0 - - - -    Fall Risk Fall Risk  08/18/2020 05/31/2020 05/09/2018 11/12/2017 10/28/2017  Falls in the past year? 1 1 0 No No  Number falls in past yr: 1 1 - - -  Injury with Fall? 1 0 - - -  Risk for fall due to : Impaired balance/gait;History of fall(s) Impaired balance/gait;Impaired mobility;History of fall(s) - - -  Follow up Falls evaluation completed Falls prevention discussed - - -    FALL RISK PREVENTION PERTAINING TO THE HOME:  Any stairs in or around the home? No  If so, are there any without handrails? No  Home free of loose throw rugs in walkways, pet beds,  electrical cords, etc? Yes  Adequate lighting in your home to reduce risk of falls? Yes   ASSISTIVE DEVICES UTILIZED TO  PREVENT FALLS:  Life alert? No  Use of a cane, walker or w/c? Yes  Grab bars in the bathroom? No  Shower chair or bench in shower? No  Elevated toilet seat or a handicapped toilet? No   TIMED UP AND GO:  Was the test performed? Yes .  Length of time to ambulate 10 feet: 13 sec.   Gait slow and steady with assistive device  Cognitive Function:Normal cognitive status assessed by direct observation by this Nurse Health Advisor. No abnormalities found.          Immunizations Immunization History  Administered Date(s) Administered   PFIZER(Purple Top)SARS-COV-2 Vaccination 04/21/2019, 05/19/2019, 11/24/2019   Pneumococcal Conjugate-13 05/09/2018    TDAP status: Due, Education has been provided regarding the importance of this vaccine. Advised may receive this vaccine at local pharmacy or Health Dept. Aware to provide a copy of the vaccination record if obtained from local pharmacy or Health Dept. Verbalized acceptance and understanding.  Flu Vaccine status: Declined, Education has been provided regarding the importance of this vaccine but patient still declined. Advised may receive this vaccine at local pharmacy or Health Dept. Aware to provide a copy of the vaccination record if obtained from local pharmacy or Health Dept. Verbalized acceptance and understanding.  Pneumococcal vaccine status: Declined,  Education has been provided regarding the importance of this vaccine but patient still declined. Advised may receive this vaccine at local pharmacy or Health Dept. Aware to provide a copy of the vaccination record if obtained from local pharmacy or Health Dept. Verbalized acceptance and understanding.   Covid-19 vaccine status: Completed vaccines  Qualifies for Shingles Vaccine? Yes   Zostavax completed No   Shingrix Completed?: No.    Education has been provided  regarding the importance of this vaccine. Patient has been advised to call insurance company to determine out of pocket expense if they have not yet received this vaccine. Advised may also receive vaccine at local pharmacy or Health Dept. Verbalized acceptance and understanding.  Screening Tests Health Maintenance  Topic Date Due   FOOT EXAM  Never done   OPHTHALMOLOGY EXAM  Never done   TETANUS/TDAP  Never done   Zoster Vaccines- Shingrix (1 of 2) Never done   PNA vac Low Risk Adult (2 of 2 - PPSV23) 05/09/2019   COVID-19 Vaccine (4 - Booster for Pfizer series) 03/26/2020   INFLUENZA VACCINE  09/19/2020   HEMOGLOBIN A1C  11/30/2020   Hepatitis C Screening  Completed   HPV VACCINES  Aged Out    Health Maintenance  Health Maintenance Due  Topic Date Due   FOOT EXAM  Never done   OPHTHALMOLOGY EXAM  Never done   TETANUS/TDAP  Never done   Zoster Vaccines- Shingrix (1 of 2) Never done   PNA vac Low Risk Adult (2 of 2 - PPSV23) 05/09/2019   COVID-19 Vaccine (4 - Booster for Pfizer series) 03/26/2020    Colorectal cancer screening: No longer required.   Lung Cancer Screening: (Low Dose CT Chest recommended if Age 84-80 years, 30 pack-year currently smoking OR have quit w/in 15years.) does not qualify.   Lung Cancer Screening Referral: no  Additional Screening:  Hepatitis C Screening: does qualify; Completed yes  Vision Screening: Recommended annual ophthalmology exams for early detection of glaucoma and other disorders of the eye. Is the patient up to date with their annual eye exam?  Yes  Who is the provider or what is the name of the office in which the patient attends  annual eye exams? [facility] If pt is not established with a provider, would they like to be referred to a provider to establish care? No .   Dental Screening: Recommended annual dental exams for proper oral hygiene  Community Resource Referral / Chronic Care Management: CRR required this visit?  No    CCM required this visit?  No      Plan:     I have personally reviewed and noted the following in the patient's chart:   Medical and social history Use of alcohol, tobacco or illicit drugs  Current medications and supplements including opioid prescriptions. Patient is not currently taking opioid prescriptions. Functional ability and status Nutritional status Physical activity Advanced directives List of other physicians Hospitalizations, surgeries, and ER visits in previous 12 months Vitals Screenings to include cognitive, depression, and falls Referrals and appointments  In addition, I have reviewed and discussed with patient certain preventive protocols, quality metrics, and best practice recommendations. A written personalized care plan for preventive services as well as general preventive health recommendations were provided to patient.     Sheral Flow, LPN   6/86/1683   Nurse Notes: n/a

## 2020-08-18 NOTE — Chronic Care Management (AMB) (Signed)
  Chronic Care Management   Note  08/18/2020 Name: [name] MRN: [id number] DOB: Sep 26, 1940  [name] is a 80 y.o. year old male who is a primary care patient of Mitchel Honour, Ines Bloomer, MD. I reached out to Sherald Hess by phone today in response to a referral sent by [name]'s PCP, Dr. Elenora Fender.      [name] was given information about Chronic Care Management services today including:  CCM service includes personalized support from designated clinical staff supervised by his physician, including individualized plan of care and coordination with other care providers 24/7 contact phone numbers for assistance for urgent and routine care needs. Service will only be billed when office clinical staff spend 20 minutes or more in a month to coordinate care. Only one practitioner may furnish and bill the service in a calendar month. The patient may stop CCM services at any time (effective at the end of the month) by phone call to the office staff. The patient will be responsible for cost sharing (co-pay) of up to 20% of the service fee (after annual deductible is met).  Patient spouse [name] did not agree to enrollment in care management services and does not wish to consider at this time.  Follow up plan: Patient declines engagement by the care management team. Appropriate care team members and provider have been notified via electronic communication. he care management team is available to follow up with the patient after provider conversation with the patient regarding recommendation for care management engagement and subsequent re-referral to the care management team.   [facility]

## 2020-08-18 NOTE — Telephone Encounter (Signed)
Cardiac MRI has been ordered and message sent to scheduler for scheduling procedure.

## 2020-08-18 NOTE — Patient Instructions (Addendum)
[name] , Thank you for taking time to come for your Medicare Wellness Visit. I appreciate your ongoing commitment to your health goals. Please review the following plan we discussed and let me know if I can assist you in the future.   Screening recommendations/referrals: Colonoscopy: last done 12/26/2016; no repeat due to age Recommended yearly ophthalmology/optometry visit for glaucoma screening and checkup Recommended yearly dental visit for hygiene and checkup  Vaccinations: Influenza vaccine: declined Pneumococcal vaccine: 05/09/2018 Tdap vaccine: declined Shingles vaccine: declined   Covid-19: 04/21/2019, 05/19/2019, 11/24/2019  Advanced directives: Advance directive discussed with you today. Even though you declined this today please call our office should you change your mind and we can give you the proper paperwork for you to fill out.  Conditions/risks identified: My goal is to eliminate this back pain.  Next appointment: Please schedule your next Medicare Wellness Visit with your Nurse Health Advisor in 1 year by calling [phone].  Preventive Care 78 Years and Older, Male Preventive care refers to lifestyle choices and visits with your health care provider that can promote health and wellness. What does preventive care include? A yearly physical exam. This is also called an annual well check. Dental exams once or twice a year. Routine eye exams. Ask your health care provider how often you should have your eyes checked. Personal lifestyle choices, including: Daily care of your teeth and gums. Regular physical activity. Eating a healthy diet. Avoiding tobacco and drug use. Limiting alcohol use. Practicing safe sex. Taking low doses of aspirin every day. Taking vitamin and mineral supplements as recommended by your health care provider. What happens during an annual well check? The services and screenings done by your health care provider during your annual well check  will depend on your age, overall health, lifestyle risk factors, and family history of disease. Counseling  Your health care provider may ask you questions about your: Alcohol use. Tobacco use. Drug use. Emotional well-being. Home and relationship well-being. Sexual activity. Eating habits. History of falls. Memory and ability to understand (cognition). Work and work Statistician. Screening  You may have the following tests or measurements: Height, weight, and BMI. Blood pressure. Lipid and cholesterol levels. These may be checked every 5 years, or more frequently if you are over 29 years old. Skin check. Lung cancer screening. You may have this screening every year starting at age 42 if you have a 30-pack-year history of smoking and currently smoke or have quit within the past 15 years. Fecal occult blood test (FOBT) of the stool. You may have this test every year starting at age 68. Flexible sigmoidoscopy or colonoscopy. You may have a sigmoidoscopy every 5 years or a colonoscopy every 10 years starting at age 38. Prostate cancer screening. Recommendations will vary depending on your family history and other risks. Hepatitis C blood test. Hepatitis B blood test. Sexually transmitted disease (STD) testing. Diabetes screening. This is done by checking your blood sugar (glucose) after you have not eaten for a while (fasting). You may have this done every 1-3 years. Abdominal aortic aneurysm (AAA) screening. You may need this if you are a current or former smoker. Osteoporosis. You may be screened starting at age 53 if you are at high risk. Talk with your health care provider about your test results, treatment options, and if necessary, the need for more tests. Vaccines  Your health care provider may recommend certain vaccines, such as: Influenza vaccine. This is recommended every year. Tetanus, diphtheria, and acellular pertussis (  Tdap, Td) vaccine. You may need a Td booster every 10  years. Zoster vaccine. You may need this after age 26. Pneumococcal 13-valent conjugate (PCV13) vaccine. One dose is recommended after age 67. Pneumococcal polysaccharide (PPSV23) vaccine. One dose is recommended after age 51. Talk to your health care provider about which screenings and vaccines you need and how often you need them. This information is not intended to replace advice given to you by your health care provider. Make sure you discuss any questions you have with your health care provider. Document Released: 03/04/2015 Document Revised: 10/26/2015 Document Reviewed: 12/07/2014 Elsevier Interactive Patient Education  2017 [employer] Prevention in the Home Falls can cause injuries. They can happen to people of all ages. There are many things you can do to make your home safe and to help prevent falls. What can I do on the outside of my home? Regularly fix the edges of walkways and driveways and fix any cracks. Remove anything that might make you trip as you walk through a door, such as a raised step or threshold. Trim any bushes or trees on the path to your home. Use bright outdoor lighting. Clear any walking paths of anything that might make someone trip, such as rocks or tools. Regularly check to see if handrails are loose or broken. Make sure that both sides of any steps have handrails. Any raised decks and porches should have guardrails on the edges. Have any leaves, snow, or ice cleared regularly. Use sand or salt on walking paths during winter. Clean up any spills in your garage right away. This includes oil or grease spills. What can I do in the bathroom? Use night lights. Install grab bars by the toilet and in the tub and shower. Do not use towel bars as grab bars. Use non-skid mats or decals in the tub or shower. If you need to sit down in the shower, use a plastic, non-slip stool. Keep the floor dry. Clean up any water that spills on the floor as soon as it  happens. Remove soap buildup in the tub or shower regularly. Attach bath mats securely with double-sided non-slip rug tape. Do not have throw rugs and other things on the floor that can make you trip. What can I do in the bedroom? Use night lights. Make sure that you have a light by your bed that is easy to reach. Do not use any sheets or blankets that are too big for your bed. They should not hang down onto the floor. Have a firm chair that has side arms. You can use this for support while you get dressed. Do not have throw rugs and other things on the floor that can make you trip. What can I do in the kitchen? Clean up any spills right away. Avoid walking on wet floors. Keep items that you use a lot in easy-to-reach places. If you need to reach something above you, use a strong step stool that has a grab bar. Keep electrical cords out of the way. Do not use floor polish or wax that makes floors slippery. If you must use wax, use non-skid floor wax. Do not have throw rugs and other things on the floor that can make you trip. What can I do with my stairs? Do not leave any items on the stairs. Make sure that there are handrails on both sides of the stairs and use them. Fix handrails that are broken or loose. Make sure that handrails are  as long as the stairways. Check any carpeting to make sure that it is firmly attached to the stairs. Fix any carpet that is loose or worn. Avoid having throw rugs at the top or bottom of the stairs. If you do have throw rugs, attach them to the floor with carpet tape. Make sure that you have a light switch at the top of the stairs and the bottom of the stairs. If you do not have them, ask someone to add them for you. What else can I do to help prevent falls? Wear shoes that: Do not have high heels. Have rubber bottoms. Are comfortable and fit you well. Are closed at the toe. Do not wear sandals. If you use a stepladder: Make sure that it is fully opened.  Do not climb a closed stepladder. Make sure that both sides of the stepladder are locked into place. Ask someone to hold it for you, if possible. Clearly mark and make sure that you can see: Any grab bars or handrails. First and last steps. Where the edge of each step is. Use tools that help you move around (mobility aids) if they are needed. These include: Canes. Walkers. Scooters. Crutches. Turn on the lights when you go into a dark area. Replace any light bulbs as soon as they burn out. Set up your furniture so you have a clear path. Avoid moving your furniture around. If any of your floors are uneven, fix them. If there are any pets around you, be aware of where they are. Review your medicines with your doctor. Some medicines can make you feel dizzy. This can increase your chance of falling. Ask your doctor what other things that you can do to help prevent falls. This information is not intended to replace advice given to you by your health care provider. Make sure you discuss any questions you have with your health care provider. Document Released: 12/02/2008 Document Revised: 07/14/2015 Document Reviewed: 03/12/2014 Elsevier Interactive Patient Education  2017 [employer].

## 2020-08-19 ENCOUNTER — Telehealth: Payer: Self-pay | Admitting: Emergency Medicine

## 2020-08-19 ENCOUNTER — Telehealth: Payer: Self-pay | Admitting: Cardiovascular Disease

## 2020-08-19 DIAGNOSIS — I110 Hypertensive heart disease with heart failure: Secondary | ICD-10-CM | POA: Diagnosis not present

## 2020-08-19 DIAGNOSIS — M170 Bilateral primary osteoarthritis of knee: Secondary | ICD-10-CM | POA: Diagnosis not present

## 2020-08-19 DIAGNOSIS — M479 Spondylosis, unspecified: Secondary | ICD-10-CM | POA: Diagnosis not present

## 2020-08-19 DIAGNOSIS — I25119 Atherosclerotic heart disease of native coronary artery with unspecified angina pectoris: Secondary | ICD-10-CM | POA: Diagnosis not present

## 2020-08-19 DIAGNOSIS — I083 Combined rheumatic disorders of mitral, aortic and tricuspid valves: Secondary | ICD-10-CM | POA: Diagnosis not present

## 2020-08-19 DIAGNOSIS — M545 Low back pain, unspecified: Secondary | ICD-10-CM | POA: Diagnosis not present

## 2020-08-19 DIAGNOSIS — I700 Atherosclerosis of aorta: Secondary | ICD-10-CM | POA: Diagnosis not present

## 2020-08-19 DIAGNOSIS — I5032 Chronic diastolic (congestive) heart failure: Secondary | ICD-10-CM | POA: Diagnosis not present

## 2020-08-19 DIAGNOSIS — G8929 Other chronic pain: Secondary | ICD-10-CM | POA: Diagnosis not present

## 2020-08-19 MED ORDER — CLOPIDOGREL BISULFATE 75 MG PO TABS: 75.0000 mg | ORAL_TABLET | Freq: Every day | ORAL | 0 refills | Status: DC

## 2020-08-19 MED ORDER — FUROSEMIDE 40 MG PO TABS: 40.0000 mg | ORAL_TABLET | ORAL | 0 refills | Status: DC

## 2020-08-19 MED ORDER — VALSARTAN 320 MG PO TABS: 320.0000 mg | ORAL_TABLET | Freq: Every day | ORAL | 0 refills | Status: DC

## 2020-08-19 MED ORDER — ROSUVASTATIN CALCIUM 40 MG PO TABS: 40.0000 mg | ORAL_TABLET | Freq: Every day | ORAL | 0 refills | Status: DC

## 2020-08-19 NOTE — Telephone Encounter (Signed)
Called and left vm for Clair Gulling to advise him of providers recommendations.

## 2020-08-19 NOTE — Telephone Encounter (Signed)
See message, thank you.

## 2020-08-19 NOTE — Telephone Encounter (Signed)
[place] Name: [facility] Agency Name: Santina Evans Phone #: [phone] LVM Service Requested: OT (examples: OT/PT/Skilled Nursing/Social Work/Speech Therapy/Wound Care) Frequency of Visits: 1w2,2w3,1w2

## 2020-08-19 NOTE — Telephone Encounter (Signed)
Should not take ibuprofen.  Tylenol only.  Thanks.

## 2020-08-19 NOTE — Telephone Encounter (Signed)
Degenerative disc disease of the spine.  Continue Voltaren gel with Tylenol.  Avoid oral NSAIDs.  Thanks.

## 2020-08-19 NOTE — Telephone Encounter (Signed)
*  STAT* If patient is at the pharmacy, call can be transferred to refill team.   1. Which medications need to be refilled? (please list name of each medication and dose if known) furosemide (LASIX) 40 MG tablet clopidogrel (PLAVIX) 75 MG tablet rosuvastatin (CRESTOR) 40 MG tablet valsartan (DIOVAN) 320 MG tablet  2. Which pharmacy/location (including street and city if local pharmacy) is medication to be sent to? [place], [place].  3. Do they need a 30 day or 90 day supply? 7 day supply of each until mail service arrives    Completely out of medications.

## 2020-08-19 NOTE — Telephone Encounter (Signed)
Left detailed message with MD response.   Upon investigation, medications refilled by cardiology.

## 2020-08-19 NOTE — Telephone Encounter (Signed)
Call returned to patient by Interpreter [phone]). Patient's wife answered phone and speaks [place].  Interpreter stayed on phone if needed.  Patient's wife notified MRI has been ordered and that it will be scheduled at [place].  She is aware they will be contacted with appointment information.

## 2020-08-19 NOTE — Telephone Encounter (Signed)
Pt is needing to know if his MRI order should be scheduled prior to his appt in July for a hospital f/u or after he is seen. Please advise.

## 2020-08-19 NOTE — Telephone Encounter (Signed)
   Malena Peer called and said that the patient BP is elevated, 150/90. He said that the patient has no SOB or chest pain. Also said that the patient is still waiting on medications from [facility]. He is also wanting to know the cause of the patients chronic back pain. He said that the patient tried Voltaren gel and it has helped some.    Phone: [phone]

## 2020-08-19 NOTE — Telephone Encounter (Signed)
Sent in a week supply of Rosuvastatin, Diovan, & Plavix, per pt request, to [facility] until his mail order arrives.

## 2020-08-19 NOTE — Addendum Note (Signed)
Addended by: Gaetano Net on: 08/19/2020 02:06 PM   Modules accepted: Orders

## 2020-08-22 NOTE — Telephone Encounter (Signed)
Please place orders for service as requested.  Thanks.

## 2020-08-23 ENCOUNTER — Telehealth: Payer: Self-pay | Admitting: Emergency Medicine

## 2020-08-23 DIAGNOSIS — M170 Bilateral primary osteoarthritis of knee: Secondary | ICD-10-CM | POA: Diagnosis not present

## 2020-08-23 DIAGNOSIS — I5032 Chronic diastolic (congestive) heart failure: Secondary | ICD-10-CM | POA: Diagnosis not present

## 2020-08-23 DIAGNOSIS — G8929 Other chronic pain: Secondary | ICD-10-CM | POA: Diagnosis not present

## 2020-08-23 DIAGNOSIS — I700 Atherosclerosis of aorta: Secondary | ICD-10-CM | POA: Diagnosis not present

## 2020-08-23 DIAGNOSIS — I083 Combined rheumatic disorders of mitral, aortic and tricuspid valves: Secondary | ICD-10-CM | POA: Diagnosis not present

## 2020-08-23 DIAGNOSIS — M479 Spondylosis, unspecified: Secondary | ICD-10-CM | POA: Diagnosis not present

## 2020-08-23 DIAGNOSIS — I110 Hypertensive heart disease with heart failure: Secondary | ICD-10-CM | POA: Diagnosis not present

## 2020-08-23 DIAGNOSIS — M545 Low back pain, unspecified: Secondary | ICD-10-CM | POA: Diagnosis not present

## 2020-08-23 DIAGNOSIS — I25119 Atherosclerotic heart disease of native coronary artery with unspecified angina pectoris: Secondary | ICD-10-CM | POA: Diagnosis not present

## 2020-08-23 NOTE — Telephone Encounter (Signed)
Sharyn Lull notified that PCP gives verbal orders for OT as requested; verb understanding.

## 2020-08-23 NOTE — Telephone Encounter (Signed)
[facility] [phone] Prior to exertion BP is 170/100 Patient is asymptomatic

## 2020-08-24 ENCOUNTER — Other Ambulatory Visit: Payer: Self-pay

## 2020-08-24 ENCOUNTER — Encounter: Payer: Self-pay | Admitting: Internal Medicine

## 2020-08-24 ENCOUNTER — Ambulatory Visit (INDEPENDENT_AMBULATORY_CARE_PROVIDER_SITE_OTHER): Payer: Medicare HMO | Admitting: Internal Medicine

## 2020-08-24 VITALS — BP 124/72 | HR 57 | Ht 67.0 in | Wt 179.0 lb

## 2020-08-24 DIAGNOSIS — R079 Chest pain, unspecified: Secondary | ICD-10-CM | POA: Diagnosis not present

## 2020-08-24 DIAGNOSIS — M545 Low back pain, unspecified: Secondary | ICD-10-CM | POA: Diagnosis not present

## 2020-08-24 DIAGNOSIS — G8929 Other chronic pain: Secondary | ICD-10-CM

## 2020-08-24 DIAGNOSIS — I10 Essential (primary) hypertension: Secondary | ICD-10-CM

## 2020-08-24 DIAGNOSIS — I110 Hypertensive heart disease with heart failure: Secondary | ICD-10-CM | POA: Diagnosis not present

## 2020-08-24 DIAGNOSIS — M5442 Lumbago with sciatica, left side: Secondary | ICD-10-CM

## 2020-08-24 DIAGNOSIS — M479 Spondylosis, unspecified: Secondary | ICD-10-CM | POA: Diagnosis not present

## 2020-08-24 DIAGNOSIS — I5032 Chronic diastolic (congestive) heart failure: Secondary | ICD-10-CM | POA: Diagnosis not present

## 2020-08-24 DIAGNOSIS — I25119 Atherosclerotic heart disease of native coronary artery with unspecified angina pectoris: Secondary | ICD-10-CM | POA: Diagnosis not present

## 2020-08-24 DIAGNOSIS — I083 Combined rheumatic disorders of mitral, aortic and tricuspid valves: Secondary | ICD-10-CM | POA: Diagnosis not present

## 2020-08-24 DIAGNOSIS — I700 Atherosclerosis of aorta: Secondary | ICD-10-CM | POA: Diagnosis not present

## 2020-08-24 DIAGNOSIS — M170 Bilateral primary osteoarthritis of knee: Secondary | ICD-10-CM | POA: Diagnosis not present

## 2020-08-24 NOTE — Assessment & Plan Note (Signed)
BP Readings from Last 3 Encounters:  08/24/20 124/72  08/18/20 118/60  08/10/20 127/71   Stable, pt to continue medical treatment coreg, diovan

## 2020-08-24 NOTE — Telephone Encounter (Signed)
Ok, thanks.

## 2020-08-24 NOTE — Progress Notes (Signed)
Patient ID: [name], male   DOB: 03-Dec-1940, 80 y.o.   MRN: [id number]        Chief Complaint: follow up hospital       HPI:  [name] is a 80 y.o. male here with me today rather than PCP; thought he had appt last wk but apparently did not and was turned away from PCP, now here overall doing relatively well 2 wks post hopsn 6/20-6/22 with CP; was started on empiric PPI while there and states pain overall much improved; also seen per cardiology given extensive hx and has planned f/u Dr Burt Knack July 20 and Cardiac MRI aug 12.  Pt denies chest pain, increased sob or doe, wheezing, orthopnea, PND, increased LE swelling, palpitations, dizziness or syncope.  Denies worsening reflux, abd pain, dysphagia, n/v, bowel change or blood.  Does have ongoing weakness but improved as well, walks with cane, has PT at home already started.  Pt continues to have recurring LBP without change in severity, bowel or bladder change, fever, wt loss,  worsening LE pain/numbness/weakness, gait change or falls.  Has been getting by with tylenol and a topical OTC pain cream.  Pt related BP was high 2 days ago with PT at 170/100 and part of reason he is here today, but BP much improved today.   Pt denies fever, wt loss, night sweats, loss of appetite, or other constitutional symptoms  Pt has no falls, no new complaints.         Wt Readings from Last 3 Encounters:  08/24/20 179 lb (81.2 kg)  08/18/20 177 lb 9.6 oz (80.6 kg)  08/08/20 179 lb 4.8 oz (81.3 kg)   BP Readings from Last 3 Encounters:  08/24/20 124/72  08/18/20 118/60  08/10/20 127/71         Past Medical History:  Diagnosis Date   Blurred vision 05/17/2016   CAD (coronary artery disease), native coronary artery 06/14/2008   a. BMS to LCx & OM 2008 b. 03/2017 CABG x 4 (LIMA to LAD, SVG to diag 1, SVG to distal Circ, SVG to PDA)   Chest pain 04/16/2012   Diplopia    Echocardiogram    Echo 10/19: mod LVH, EF 55-60, no RWMA, Gr 1 DD, trivial AI, MAC,  mod LAE, prob small post effusion   Essential hypertension 05/17/2016   Hyperlipidemia with target low density lipoprotein (LDL) cholesterol less than 70 mg/dL 06/14/2008   Qualifier: Diagnosis of  By: Mare Ferrari, RMA, [name]     Hypertension    Hypertensive heart disease 06/14/2008   Qualifier: Diagnosis of  By: Mare Ferrari, RMA, [name]     Hypertensive urgency, malignant 04/16/2012   Hypokalemia 04/14/2017   Internal hemorrhoids 04/19/2012   Ischemic stroke ([place]) 05/02/2016   Non-ST elevation (NSTEMI) myocardial infarction [facility])    pandiverticulosis 04/22/2012   12/17/2011. [place]. Juanita Craver MD. Colonoscopy. Moderate sized internal hemorrhoids and extensive pandiverticulosis. Repeat 5 years.     Pericardial effusion    Echo 11/19: mild focal basal septal hypertrophy, EF 55-60, Gr 2 DD, trivial AI, trivial TR, trivial PI, small to mod eff post to heart - no evidence of RV collapse.>> repeat limited echo in 03/2018 // Echo 03/2018: EF 55-60, mild LVH, +diastolic dysfunction, no RWMA, PASP 23, trivial pericardial effusion    Polycythemia vera(238.4) 04/17/2012   S/P CABG x 4 04/17/2017   Syncope and collapse 04/17/2012   Pt syncopized while sitting in bed giving history @ time of admission to hospital Tachycardic (  appeared sinus) to 120s-130s and hypotensive to 50s/30s.  Unresponsive initially >> Spontaneously resolved after 2-3 minutes >> return to baseline ~30 minutes    Vertigo    Past Surgical History:  Procedure Laterality Date   CORONARY ARTERY BYPASS GRAFT N/A 04/17/2017   Procedure: CORONARY ARTERY BYPASS GRAFTING (CABG) x4 , using left internal mammary artery  to LAD and right leg greater saphenous vein harvested endoscopically  to PDA, Diagonal I and Circumflex;  Surgeon: [name], MD;  Location: [facility];  Service: Open Heart Surgery;  Laterality: N/A;   CORONARY ARTERY BYPASS GRAFT  2020   CORONARY STENT PLACEMENT     LEFT HEART CATH AND CORONARY ANGIOGRAPHY N/A 04/16/2017    Procedure: LEFT HEART CATH AND CORONARY ANGIOGRAPHY;  Surgeon: Sherren Mocha, MD;  Location: [facility];  Service: Cardiovascular;  Laterality: N/A;   TEE WITHOUT CARDIOVERSION N/A 04/17/2017   Procedure: TRANSESOPHAGEAL ECHOCARDIOGRAM (TEE);  Surgeon: [name], MD;  Location: [facility];  Service: Open Heart Surgery;  Laterality: N/A;    reports that he has never smoked. He has never used smokeless tobacco. He reports that he does not drink alcohol and does not use drugs. family history includes Cancer in his sister; Heart disease in his mother. Allergies  Allergen Reactions   Lipitor [Atorvastatin] Shortness Of Breath and Other (See Comments)    Sores non head   Morphine And Related Shortness Of Breath, Swelling and Rash    Throat swelling   Pork-Derived Products Swelling and Rash    Tongue swelling No pork products -    Current Outpatient Medications on File Prior to Visit  Medication Sig Dispense Refill   acetaminophen (TYLENOL) 325 MG tablet Take 325 mg by mouth daily.     aspirin EC 81 MG tablet Take 81 mg by mouth daily.     carvedilol (COREG) 12.5 MG tablet Take 1 tablet (12.5 mg total) by mouth 2 (two) times daily. 180 tablet 3   clopidogrel (PLAVIX) 75 MG tablet Take 1 tablet (75 mg total) by mouth daily. 7 tablet 0   furosemide (LASIX) 40 MG tablet Take 1 tablet (40 mg total) by mouth every other day. 7 tablet 0   pantoprazole (PROTONIX) 40 MG tablet Take 1 tablet (40 mg total) by mouth daily. 30 tablet 0   potassium chloride SA (KLOR-CON) 20 MEQ tablet Take 1 tablet (20 mEq total) by mouth every other day. 45 tablet 3   rosuvastatin (CRESTOR) 40 MG tablet Take 1 tablet (40 mg total) by mouth daily. 7 tablet 0   valsartan (DIOVAN) 320 MG tablet Take 1 tablet (320 mg total) by mouth daily. 7 tablet 0   No current facility-administered medications on file prior to visit.        ROS:  All others reviewed and negative.  Objective        PE:  BP 124/72 (BP  Location: Left Arm, Patient Position: Sitting, Cuff Size: Large)   Pulse (!) 57   Ht _0  (1.702 m)   Wt 179 lb (81.2 kg)   SpO2 96%   BMI 28.04 kg/m                 Constitutional: Pt appears in NAD               HENT: Head: NCAT.                Right Ear: External ear normal.  Left Ear: External ear normal.                Eyes: . Pupils are equal, round, and reactive to light. Conjunctivae and EOM are normal               Nose: without d/c or deformity               Neck: Neck supple. Gross normal ROM               Cardiovascular: Normal rate and regular rhythm.                 Pulmonary/Chest: Effort normal and breath sounds without rales or wheezing.                Abd:  Soft, NT, ND, + BS, no organomegaly               Neurological: Pt is alert. At baseline orientation, motor grossly intact               Skin: Skin is warm. No rashes, no other new lesions, LE edema - none               Psychiatric: Pt behavior is normal without agitation   Micro: none  Cardiac tracings I have personally interpreted today:  none  Pertinent Radiological findings (summarize): none   Lab Results  Component Value Date   WBC 6.7 08/08/2020   HGB 14.6 08/08/2020   HCT 43.0 08/08/2020   PLT 212 08/08/2020   GLUCOSE 94 08/08/2020   CHOL 111 05/31/2020   TRIG 74.0 05/31/2020   HDL 40.30 05/31/2020   LDLDIRECT 150.8 06/30/2008   LDLCALC 56 05/31/2020   ALT 13 08/08/2020   AST 19 08/08/2020   NA 141 08/08/2020   K 3.8 08/08/2020   CL 104 08/08/2020   CREATININE 0.90 08/08/2020   BUN 16 08/08/2020   CO2 26 08/08/2020   TSH 2.26 05/31/2020   PSA 1.46 05/31/2014   INR 1.55 04/17/2017   HGBA1C 6.1 05/31/2020   Assessment/Plan:  [name] is a 80 y.o. White or Caucasian [1] male with  has a past medical history of Blurred vision (05/17/2016), CAD (coronary artery disease), native coronary artery (06/14/2008), Chest pain (04/16/2012), Diplopia, Echocardiogram, Essential  hypertension (05/17/2016), Hyperlipidemia with target low density lipoprotein (LDL) cholesterol less than 70 mg/dL (06/14/2008), Hypertension, Hypertensive heart disease (06/14/2008), Hypertensive urgency, malignant (04/16/2012), Hypokalemia (04/14/2017), Internal hemorrhoids (04/19/2012), Ischemic stroke ([place]) (05/02/2016), Non-ST elevation (NSTEMI) myocardial infarction [facility]), pandiverticulosis (04/22/2012), Pericardial effusion, Polycythemia vera(238.4) (04/17/2012), S/P CABG x 4 (04/17/2017), Syncope and collapse (04/17/2012), and Vertigo.  Chest pain Acute recent now improved, at least in part likely GI related, to continue PPI, f/u MRI and cardiology as planned  Essential hypertension BP Readings from Last 3 Encounters:  08/24/20 124/72  08/18/20 118/60  08/10/20 127/71   Stable, pt to continue medical treatment coreg, diovan   Chronic low back pain Overall stable, continue current tx - tylenol, topical pain cream  Followup: Return in about 6 months (around 02/24/2021).  Cathlean Cower, MD 08/24/2020 7:22 PM [facility]

## 2020-08-24 NOTE — Assessment & Plan Note (Signed)
Overall stable, continue current tx - tylenol, topical pain cream

## 2020-08-24 NOTE — Assessment & Plan Note (Signed)
Acute recent now improved, at least in part likely GI related, to continue PPI, f/u MRI and cardiology as planned

## 2020-08-24 NOTE — Patient Instructions (Signed)
Please continue all other medications as before, and refills have been done if requested.  Please have the pharmacy call with any other refills you may need.  Please continue your efforts at being more active, low cholesterol diet, and weight control.  You are otherwise up to date with prevention measures today.  Please keep your appointments with your specialists as you may have planned - Dr Burt Knack on July 20, and the MRI on Aug 12  Please make an Appointment to return in 6 months, or sooner if needed, with Dr Mitchel Honour

## 2020-08-25 DIAGNOSIS — I25119 Atherosclerotic heart disease of native coronary artery with unspecified angina pectoris: Secondary | ICD-10-CM | POA: Diagnosis not present

## 2020-08-25 DIAGNOSIS — M170 Bilateral primary osteoarthritis of knee: Secondary | ICD-10-CM | POA: Diagnosis not present

## 2020-08-25 DIAGNOSIS — G8929 Other chronic pain: Secondary | ICD-10-CM | POA: Diagnosis not present

## 2020-08-25 DIAGNOSIS — I110 Hypertensive heart disease with heart failure: Secondary | ICD-10-CM | POA: Diagnosis not present

## 2020-08-25 DIAGNOSIS — M479 Spondylosis, unspecified: Secondary | ICD-10-CM | POA: Diagnosis not present

## 2020-08-25 DIAGNOSIS — I700 Atherosclerosis of aorta: Secondary | ICD-10-CM | POA: Diagnosis not present

## 2020-08-25 DIAGNOSIS — I5032 Chronic diastolic (congestive) heart failure: Secondary | ICD-10-CM | POA: Diagnosis not present

## 2020-08-25 DIAGNOSIS — I083 Combined rheumatic disorders of mitral, aortic and tricuspid valves: Secondary | ICD-10-CM | POA: Diagnosis not present

## 2020-08-25 DIAGNOSIS — M545 Low back pain, unspecified: Secondary | ICD-10-CM | POA: Diagnosis not present

## 2020-08-29 DIAGNOSIS — I110 Hypertensive heart disease with heart failure: Secondary | ICD-10-CM | POA: Diagnosis not present

## 2020-08-29 DIAGNOSIS — I700 Atherosclerosis of aorta: Secondary | ICD-10-CM | POA: Diagnosis not present

## 2020-08-29 DIAGNOSIS — I5032 Chronic diastolic (congestive) heart failure: Secondary | ICD-10-CM | POA: Diagnosis not present

## 2020-08-29 DIAGNOSIS — I25119 Atherosclerotic heart disease of native coronary artery with unspecified angina pectoris: Secondary | ICD-10-CM | POA: Diagnosis not present

## 2020-08-29 DIAGNOSIS — I083 Combined rheumatic disorders of mitral, aortic and tricuspid valves: Secondary | ICD-10-CM | POA: Diagnosis not present

## 2020-08-29 DIAGNOSIS — M545 Low back pain, unspecified: Secondary | ICD-10-CM | POA: Diagnosis not present

## 2020-08-29 DIAGNOSIS — M479 Spondylosis, unspecified: Secondary | ICD-10-CM | POA: Diagnosis not present

## 2020-08-29 DIAGNOSIS — M170 Bilateral primary osteoarthritis of knee: Secondary | ICD-10-CM | POA: Diagnosis not present

## 2020-08-29 DIAGNOSIS — G8929 Other chronic pain: Secondary | ICD-10-CM | POA: Diagnosis not present

## 2020-08-29 NOTE — Telephone Encounter (Signed)
Reaching out to patient to offer assistance regarding upcoming cardiac imaging study; pt verbalizes understanding of appt date/time, parking situation and where to check in, and verified current allergies; name and call back number provided for further questions should they arise Marchia Bond RN Navigator Cardiac Imaging [facility] [phone] office [phone] cell   Pt denies claustro Denies implants other than sternal wires from CABG Clarise Cruz

## 2020-08-30 ENCOUNTER — Other Ambulatory Visit: Payer: Self-pay

## 2020-08-30 ENCOUNTER — Ambulatory Visit (HOSPITAL_COMMUNITY)
Admission: RE | Admit: 2020-08-30 | Discharge: 2020-08-30 | Disposition: A | Discharge status: Home / Self Care | Payer: Medicare HMO | Source: Ambulatory Visit | Attending: Cardiovascular Disease | Admitting: Cardiovascular Disease

## 2020-08-30 DIAGNOSIS — I5032 Chronic diastolic (congestive) heart failure: Secondary | ICD-10-CM | POA: Diagnosis not present

## 2020-08-30 DIAGNOSIS — I25119 Atherosclerotic heart disease of native coronary artery with unspecified angina pectoris: Secondary | ICD-10-CM | POA: Diagnosis not present

## 2020-08-30 DIAGNOSIS — I083 Combined rheumatic disorders of mitral, aortic and tricuspid valves: Secondary | ICD-10-CM | POA: Diagnosis not present

## 2020-08-30 DIAGNOSIS — G8929 Other chronic pain: Secondary | ICD-10-CM | POA: Diagnosis not present

## 2020-08-30 DIAGNOSIS — M479 Spondylosis, unspecified: Secondary | ICD-10-CM | POA: Diagnosis not present

## 2020-08-30 DIAGNOSIS — M545 Low back pain, unspecified: Secondary | ICD-10-CM | POA: Diagnosis not present

## 2020-08-30 DIAGNOSIS — I318 Other specified diseases of pericardium: Secondary | ICD-10-CM | POA: Diagnosis not present

## 2020-08-30 DIAGNOSIS — I700 Atherosclerosis of aorta: Secondary | ICD-10-CM | POA: Diagnosis not present

## 2020-08-30 DIAGNOSIS — I110 Hypertensive heart disease with heart failure: Secondary | ICD-10-CM | POA: Diagnosis not present

## 2020-08-30 DIAGNOSIS — M170 Bilateral primary osteoarthritis of knee: Secondary | ICD-10-CM | POA: Diagnosis not present

## 2020-08-30 MED ORDER — GADOBUTROL 1 MMOL/ML IV SOLN
10.0000 mL | Freq: Once | INTRAVENOUS | Status: AC | PRN
  Administered 2020-08-30: 10 mL via INTRAVENOUS

## 2020-09-01 DIAGNOSIS — G8929 Other chronic pain: Secondary | ICD-10-CM | POA: Diagnosis not present

## 2020-09-01 DIAGNOSIS — M545 Low back pain, unspecified: Secondary | ICD-10-CM | POA: Diagnosis not present

## 2020-09-01 DIAGNOSIS — I083 Combined rheumatic disorders of mitral, aortic and tricuspid valves: Secondary | ICD-10-CM | POA: Diagnosis not present

## 2020-09-01 DIAGNOSIS — I700 Atherosclerosis of aorta: Secondary | ICD-10-CM | POA: Diagnosis not present

## 2020-09-01 DIAGNOSIS — M479 Spondylosis, unspecified: Secondary | ICD-10-CM | POA: Diagnosis not present

## 2020-09-01 DIAGNOSIS — I110 Hypertensive heart disease with heart failure: Secondary | ICD-10-CM | POA: Diagnosis not present

## 2020-09-01 DIAGNOSIS — M170 Bilateral primary osteoarthritis of knee: Secondary | ICD-10-CM | POA: Diagnosis not present

## 2020-09-01 DIAGNOSIS — I25119 Atherosclerotic heart disease of native coronary artery with unspecified angina pectoris: Secondary | ICD-10-CM | POA: Diagnosis not present

## 2020-09-01 DIAGNOSIS — I5032 Chronic diastolic (congestive) heart failure: Secondary | ICD-10-CM | POA: Diagnosis not present

## 2020-09-02 ENCOUNTER — Telehealth: Payer: Self-pay | Admitting: Cardiovascular Disease

## 2020-09-02 DIAGNOSIS — M479 Spondylosis, unspecified: Secondary | ICD-10-CM | POA: Diagnosis not present

## 2020-09-02 DIAGNOSIS — M170 Bilateral primary osteoarthritis of knee: Secondary | ICD-10-CM | POA: Diagnosis not present

## 2020-09-02 DIAGNOSIS — G8929 Other chronic pain: Secondary | ICD-10-CM | POA: Diagnosis not present

## 2020-09-02 DIAGNOSIS — I110 Hypertensive heart disease with heart failure: Secondary | ICD-10-CM | POA: Diagnosis not present

## 2020-09-02 DIAGNOSIS — I083 Combined rheumatic disorders of mitral, aortic and tricuspid valves: Secondary | ICD-10-CM | POA: Diagnosis not present

## 2020-09-02 DIAGNOSIS — I5032 Chronic diastolic (congestive) heart failure: Secondary | ICD-10-CM | POA: Diagnosis not present

## 2020-09-02 DIAGNOSIS — I25119 Atherosclerotic heart disease of native coronary artery with unspecified angina pectoris: Secondary | ICD-10-CM | POA: Diagnosis not present

## 2020-09-02 DIAGNOSIS — I700 Atherosclerosis of aorta: Secondary | ICD-10-CM | POA: Diagnosis not present

## 2020-09-02 DIAGNOSIS — M545 Low back pain, unspecified: Secondary | ICD-10-CM | POA: Diagnosis not present

## 2020-09-02 NOTE — Telephone Encounter (Signed)
Called with interpreter, determined while speaking to wife (on [place]) interpreter not needed. The "caregiver" called the office to report this. The patients wife feels like after his walk this morning that he walked to much and this is why is blood pressure became high. He is current feeling good. The caregiver is currently not there so they do not have a BP cuff available to retake readings. (Advised to ask about getting BP cuff at [place] visit for home use)  Caregiver/therapist?, comes twice a week. The chest pain occurred last night when he woke up to use the restroom, patient took a tylenol and went right back to sleep with no trouble. Pain lasted ~ 10 minutes at most, on the left side and " not very bad at all." Has had no pain since. Hospital follow up next week with Ermalinda Barrios, PA. Gave ED precautions. Verbalized understanding.   Will forward to Dr. Burt Knack and PA for advisement.

## 2020-09-02 NOTE — Telephone Encounter (Signed)
blood pressure is 160/90 right now, chest pains last night, took meds about an hour ago

## 2020-09-05 NOTE — Progress Notes (Signed)
Cardiology Office Note    Date:  09/07/2020   ID:  Samay, Delcarlo 07-18-1940, MRN [id number]   PCP:  Horald Pollen, [place]  Cardiologist:  Sherren Mocha, MD   Advanced Practice Provider:  No care team member to display Electrophysiologist:  None   [id number]   Chief Complaint  Patient presents with   Hospitalization Follow-up    History of Present Illness:  [name] is a 80 y.o. male with history of CAD status post CABG in 2019, HLD.  Hospitalized 07/2020 with atypical chest pain.  NST was normal.  Was started on PPI CT scan showed pericardial thickening in the posterior aspect of the pericardium, suspected loculated effusion related to prior bypass surgery.  MRI 08/30/20 with moderate effusion PET scan recommended by Dr. Burt Knack.  Patient comes in with his son. Many questions about MRI answered. BP up after walking last week but no symptoms and thinks he overdid it. No cardiac complaints today.    Past Medical History:  Diagnosis Date   Blurred vision 05/17/2016   CAD (coronary artery disease), native coronary artery 06/14/2008   a. BMS to LCx & OM 2008 b. 03/2017 CABG x 4 (LIMA to LAD, SVG to diag 1, SVG to distal Circ, SVG to PDA)   Chest pain 04/16/2012   Diplopia    Echocardiogram    Echo 10/19: mod LVH, EF 55-60, no RWMA, Gr 1 DD, trivial AI, MAC, mod LAE, prob small post effusion   Essential hypertension 05/17/2016   Hyperlipidemia with target low density lipoprotein (LDL) cholesterol less than 70 mg/dL 06/14/2008   Qualifier: Diagnosis of  By: Mare Ferrari, RMA, [name]     Hypertension    Hypertensive heart disease 06/14/2008   Qualifier: Diagnosis of  By: Mare Ferrari, RMA, [name]     Hypertensive urgency, malignant 04/16/2012   Hypokalemia 04/14/2017   Internal hemorrhoids 04/19/2012   Ischemic stroke ([place]) 05/02/2016   Non-ST elevation (NSTEMI) myocardial infarction [facility])    pandiverticulosis 04/22/2012   12/17/2011.  [facility]. Juanita Craver MD. Colonoscopy. Moderate sized internal hemorrhoids and extensive pandiverticulosis. Repeat 5 years.     Pericardial effusion    Echo 11/19: mild focal basal septal hypertrophy, EF 55-60, Gr 2 DD, trivial AI, trivial TR, trivial PI, small to mod eff post to heart - no evidence of RV collapse.>> repeat limited echo in 03/2018 // Echo 03/2018: EF 55-60, mild LVH, +diastolic dysfunction, no RWMA, PASP 23, trivial pericardial effusion    Polycythemia vera(238.4) 04/17/2012   S/P CABG x 4 04/17/2017   Syncope and collapse 04/17/2012   Pt syncopized while sitting in bed giving history @ time of admission to hospital Tachycardic (appeared sinus) to 120s-130s and hypotensive to 50s/30s.  Unresponsive initially >> Spontaneously resolved after 2-3 minutes >> return to baseline ~30 minutes    Vertigo     Past Surgical History:  Procedure Laterality Date   CORONARY ARTERY BYPASS GRAFT N/A 04/17/2017   Procedure: CORONARY ARTERY BYPASS GRAFTING (CABG) x4 , using left internal mammary artery  to LAD and right leg greater saphenous vein harvested endoscopically  to PDA, Diagonal I and Circumflex;  Surgeon: [name], MD;  Location: [place];  Service: Open Heart Surgery;  Laterality: N/A;   CORONARY ARTERY BYPASS GRAFT  2020   CORONARY STENT PLACEMENT     LEFT HEART CATH AND CORONARY ANGIOGRAPHY N/A 04/16/2017   Procedure: LEFT HEART CATH AND CORONARY ANGIOGRAPHY;  Surgeon:  Sherren Mocha, MD;  Location: [place] CV LAB;  Service: Cardiovascular;  Laterality: N/A;   TEE WITHOUT CARDIOVERSION N/A 04/17/2017   Procedure: TRANSESOPHAGEAL ECHOCARDIOGRAM (TEE);  Surgeon: [name], MD;  Location: [place];  Service: Open Heart Surgery;  Laterality: N/A;    Current Medications: Current Meds  Medication Sig   acetaminophen (TYLENOL) 325 MG tablet Take 325 mg by mouth daily.   aspirin EC 81 MG tablet Take 81 mg by mouth daily.   carvedilol (COREG) 12.5 MG tablet  Take 1 tablet (12.5 mg total) by mouth 2 (two) times daily.   clopidogrel (PLAVIX) 75 MG tablet Take 1 tablet (75 mg total) by mouth daily.   furosemide (LASIX) 40 MG tablet Take 1 tablet (40 mg total) by mouth every other day.   pantoprazole (PROTONIX) 40 MG tablet Take 1 tablet (40 mg total) by mouth daily.   potassium chloride SA (KLOR-CON) 20 MEQ tablet Take 1 tablet (20 mEq total) by mouth every other day.   rosuvastatin (CRESTOR) 40 MG tablet Take 1 tablet (40 mg total) by mouth daily.   valsartan (DIOVAN) 320 MG tablet Take 1 tablet (320 mg total) by mouth daily.     Allergies:   Lipitor [atorvastatin], Morphine and related, and Pork-derived products   Social History   Socioeconomic History   Marital status: Married    Spouse name: Not on file   Number of children: Not on file   Years of education: Not on file   Highest education level: Not on file  Occupational History   Not on file  Tobacco Use   Smoking status: Never   Smokeless tobacco: Never  Vaping Use   Vaping Use: Never used  Substance and Sexual Activity   Alcohol use: No    Alcohol/week: 0.0 standard drinks   Drug use: No   Sexual activity: Not on file  Other Topics Concern   Not on file  Social History Narrative   Lives in [place] with Wife and 2 sons.  From [place].  To [place] ~2000.     Currently retired but worked in [employer] Strain: Low Risk    Difficulty of Paying Living Expenses: Not hard at all  Food Insecurity: No Food Insecurity   Worried About Charity fundraiser in the Last Year: Never true   Arboriculturist in the Last Year: Never true  Transportation Needs: No Transportation Needs   Lack of Transportation (Medical): No   Lack of Transportation (Non-Medical): No  Physical Activity: Inactive   Days of Exercise per Week: 0 days   Minutes of Exercise per Session: 0 min  Stress: No Stress Concern Present   Feeling of Stress  : Not at all  Social Connections: Socially Integrated   Frequency of Communication with Friends and Family: More than three times a week   Frequency of Social Gatherings with Friends and Family: Three times a week   Attends Religious Services: More than 4 times per year   Active Member of Clubs or Organizations: No   Attends Music therapist: More than 4 times per year   Marital Status: Married     Family History:  The patient's  family history includes Cancer in his sister; Heart disease in his mother.   ROS:   Please see the history of present illness.    ROS All other systems reviewed and are negative.   PHYSICAL  EXAM:   VS:  BP 120/64   Pulse 60   Ht _0  (1.676 m)   Wt 180 lb (81.6 kg)   SpO2 97%   BMI 29.05 kg/m   Physical Exam  GEN: Well nourished, well developed, in no acute distress  Neck: no JVD, carotid bruits, or masses Cardiac:RRR; no murmurs, rubs, or gallops  Respiratory:  clear to auscultation bilaterally, normal work of breathing GI: soft, nontender, nondistended, + BS Ext: without cyanosis, clubbing, or edema, Good distal pulses bilaterally Neuro:  Alert and Oriented x 3 Psych: euthymic mood, full affect  Wt Readings from Last 3 Encounters:  09/07/20 180 lb (81.6 kg)  08/24/20 179 lb (81.2 kg)  08/18/20 177 lb 9.6 oz (80.6 kg)      Studies/Labs Reviewed:   EKG:  EKG is not ordered today.     Recent Labs: 05/31/2020: TSH 2.26 08/08/2020: ALT 13; BUN 16; Creatinine, Ser 0.90; Hemoglobin 14.6; Platelets 212; Potassium 3.8; Sodium 141   Lipid Panel    Component Value Date/Time   CHOL 111 05/31/2020 1345   CHOL 105 08/14/2019 0956   TRIG 74.0 05/31/2020 1345   HDL 40.30 05/31/2020 1345   HDL 37 (L) 08/14/2019 0956   CHOLHDL 3 05/31/2020 1345   VLDL 14.8 05/31/2020 1345   LDLCALC 56 05/31/2020 1345   LDLCALC 50 08/14/2019 0956   LDLDIRECT 150.8 06/30/2008 1007    Additional studies/ records that were reviewed today include:    Echo 08/09/20 IMPRESSIONS     1. Left ventricular ejection fraction, by estimation, is 60 to 65%. The  left ventricle has normal function. The left ventricle has no regional  wall motion abnormalities. There is mild left ventricular hypertrophy.  Left ventricular diastolic parameters  are consistent with Grade II diastolic dysfunction (pseudonormalization).  Elevated left atrial pressure.   2. Right ventricular systolic function is normal. The right ventricular  size is normal.   3. Left atrial size was moderately dilated.   4. The mitral valve is normal in structure. Trivial mitral valve  regurgitation. No evidence of mitral stenosis.   5. The aortic valve is tricuspid. Aortic valve regurgitation is trivial.  Mild aortic valve sclerosis is present, with no evidence of aortic valve  stenosis.   6. The inferior vena cava is normal in size with greater than 50%  respiratory variability, suggesting right atrial pressure of 3 mmHg.   NM Stress 08/09/2020   IMPRESSION: 1. No scintigraphic evidence of prior infarction or pharmacologically induced ischemia.   2. Normal left ventricular wall motion.   3. Left ventricular ejection fraction 58%   4. Non invasive risk stratification*: Low   CTA 08/08/2020 IMPRESSION: 1. Normal contour and caliber of the thoracic and abdominal aorta. No evidence of aneurysm, dissection, or other acute aortic pathology. Moderate aortic atherosclerosis. 2. There is an unusual, focal pericardial thickening and/or abnormal soft tissue about the lateral wall of the left ventricle, measuring approximately 8.1 x 2.2 cm. This is of uncertain nature, slightly increased compared to prior examination dated 2019, at which time it was described as pericardial effusion, however it is clearly soft tissue attenuation on current examination and not consistent with effusion. Cardiac MRI performed on a nonemergent basis may be helpful to more clearly assess nature and  etiology of this finding. 3. Moderate hiatal hernia with intrathoracic position of the gastric fundus. 4. Coronary artery disease. 5. Diverticulosis without evidence of diverticulitis. 6. Prostatomegaly.     Risk Assessment/Calculations:  ASSESSMENT:    1. Coronary artery disease involving native coronary artery of native heart without angina pectoris   2. Pericardial mass   3. Mixed hyperlipidemia   4. Essential hypertension   5. Chronic diastolic heart failure (HCC)      PLAN:  In order of problems listed above:  CAD status post CABG in 2019 hospitalized 07/2020 with atypical chest pain NST was normal started on PPI with improvement. On Plavix and ASA  Pericardial thickening in the posterior aspect of the pericardium on CT scan MRI show complex pericadial confluence and PET scan now ordered.  This has been present on a CTA for at least 3 years. suspected loculated effusion related to prior bypass surgery per Dr. Burt Knack. For PET scan and if negative monitor for CHF symptoms.  HLD LDL 56 05/2020 on crestor  HTN controlled  Diastolic CHF compensated.  Shared Decision Making/Informed Consent        Medication Adjustments/Labs and Tests Ordered: Current medicines are reviewed at length with the patient today.  Concerns regarding medicines are outlined above.  Medication changes, Labs and Tests ordered today are listed in the Patient Instructions below. Patient Instructions  Medication Instructions:  Your physician recommends that you continue on your current medications as directed. Please refer to the Current Medication list given to you today.  *If you need a refill on your cardiac medications before your next appointment, please call your pharmacy*   Lab Work: None If you have labs (blood work) drawn today and your tests are completely normal, you will receive your results only by: [facility] (if you have MyChart) OR A paper copy in the mail If you  have any lab test that is abnormal or we need to change your treatment, we will call you to review the results.   Follow-Up: At [facility], you and your health needs are our priority.  As part of our continuing mission to provide you with exceptional heart care, we have created designated Provider Care Teams.  These Care Teams include your primary Cardiologist (physician) and Advanced Practice Providers (APPs -  Physician Assistants and Nurse Practitioners) who all work together to provide you with the care you need, when you need it.  We recommend signing up for the patient portal called "MyChart".  Sign up information is provided on this After Visit Summary.  MyChart is used to connect with patients for Virtual Visits (Telemedicine).  Patients are able to view lab/test results, encounter notes, upcoming appointments, etc.  Non-urgent messages can be sent to your provider as well.   To learn more about what you can do with MyChart, go to [email or web address].    Your next appointment:   As scheduled   Signed, Ermalinda Barrios, PA-C  09/07/2020 2:26 PM    [facility] [place], [place], [place]  [address] Phone: [phone]; Fax: [phone]

## 2020-09-06 ENCOUNTER — Telehealth: Payer: Self-pay

## 2020-09-06 DIAGNOSIS — I318 Other specified diseases of pericardium: Secondary | ICD-10-CM

## 2020-09-06 NOTE — Telephone Encounter (Signed)
   Shirlean Mylar from [facility] calling to report missed appt 7/18

## 2020-09-06 NOTE — Telephone Encounter (Signed)
-----   Message from Sherren Mocha, MD sent at 09/06/2020  1:52 PM EDT ----- Regarding: RE: Best Guess:  Complex Pericardial Effusion OK I will order a PET scan to evaluate for pericardial mass. He had an effusion following CABG a few years ago as best as I can remember. Appreciate you both taking such a careful look at this.   Triage RN: could you please order a PET scan to evaluate pericardial mass?  Ronalee Belts ----- Message ----- From: Donato Heinz, MD Sent: 09/06/2020   9:57 AM EDT To: Sherren Mocha, MD, Werner Lean, MD Subject: RE: Best Guess:  Complex Pericardial Effusion  Lindie Spruce and I were talking more about this case yesterday, and would recommend starting with a PET scan to evaluate this pericardial mass.  If PET negative, probably can just monitor if not having any heart failure symptoms to suggest constriction Gerald Stabs   ----- Message ----- From: Werner Lean, MD Sent: 08/30/2020  12:56 PM EDT To: Sherren Mocha, MD, Donato Heinz, MD Subject: Best Guess:  Complex Pericardial Effusion      Hey all,   Gerald Stabs, asking to get your thoughts on this guy (once you are back).  Has this asymptomatic effusion not suggestive of constriction.  A lot of borderline things on the tissues stuff.  My best guess is there is a complex pericardial effusion with a clot in the center, but wanted to get a second pair of eyes.  There is a moderate (max dimension 17 mm) heterogeneous effusion overlying the lateral wall of the left ventricle (most prominent over the anterolateral base.  There is intermediate but largely iso-intesne in T1 signal intensity (1159 ms max average 1070 ms) with an intermediate to iso-intense (65 ms at max, average 50 ms).  There is evidence of contrast enhancement and pericardial thickening.  No evidence of fat deposition based on dark blood assessment.  These is an area of lack of enhancement best seen in the PSIR view.   Thanks for your  thoughts and for your patience with the scan,  [facility]

## 2020-09-06 NOTE — Telephone Encounter (Signed)
I spoke with the pt and he declined the need for a Spanish Interpreter and he repeated back to me to ensure his understanding the need for a PET Scan per Dr. Burt Knack and he agreed.   I advised him that I will order and the [facility] will call him to schedule... he also has a post hospital follow up with Gerrianne Scale PA tomorrow 09/07/20 and can further discuss.   Sent to precert.

## 2020-09-07 ENCOUNTER — Ambulatory Visit: Payer: Medicare HMO | Admitting: Physician Assistant

## 2020-09-07 ENCOUNTER — Encounter: Payer: Self-pay | Admitting: Physician Assistant

## 2020-09-07 ENCOUNTER — Other Ambulatory Visit: Payer: Self-pay

## 2020-09-07 VITALS — BP 120/64 | HR 60 | Ht 66.0 in | Wt 180.0 lb

## 2020-09-07 DIAGNOSIS — I083 Combined rheumatic disorders of mitral, aortic and tricuspid valves: Secondary | ICD-10-CM | POA: Diagnosis not present

## 2020-09-07 DIAGNOSIS — G8929 Other chronic pain: Secondary | ICD-10-CM | POA: Diagnosis not present

## 2020-09-07 DIAGNOSIS — I10 Essential (primary) hypertension: Secondary | ICD-10-CM | POA: Diagnosis not present

## 2020-09-07 DIAGNOSIS — I318 Other specified diseases of pericardium: Secondary | ICD-10-CM | POA: Diagnosis not present

## 2020-09-07 DIAGNOSIS — I700 Atherosclerosis of aorta: Secondary | ICD-10-CM | POA: Diagnosis not present

## 2020-09-07 DIAGNOSIS — I2510 Atherosclerotic heart disease of native coronary artery without angina pectoris: Secondary | ICD-10-CM

## 2020-09-07 DIAGNOSIS — I110 Hypertensive heart disease with heart failure: Secondary | ICD-10-CM | POA: Diagnosis not present

## 2020-09-07 DIAGNOSIS — M170 Bilateral primary osteoarthritis of knee: Secondary | ICD-10-CM | POA: Diagnosis not present

## 2020-09-07 DIAGNOSIS — M545 Low back pain, unspecified: Secondary | ICD-10-CM | POA: Diagnosis not present

## 2020-09-07 DIAGNOSIS — I5032 Chronic diastolic (congestive) heart failure: Secondary | ICD-10-CM

## 2020-09-07 DIAGNOSIS — I25119 Atherosclerotic heart disease of native coronary artery with unspecified angina pectoris: Secondary | ICD-10-CM | POA: Diagnosis not present

## 2020-09-07 DIAGNOSIS — E782 Mixed hyperlipidemia: Secondary | ICD-10-CM | POA: Diagnosis not present

## 2020-09-07 DIAGNOSIS — M479 Spondylosis, unspecified: Secondary | ICD-10-CM | POA: Diagnosis not present

## 2020-09-07 NOTE — Patient Instructions (Signed)
Medication Instructions:  Your physician recommends that you continue on your current medications as directed. Please refer to the Current Medication list given to you today.  *If you need a refill on your cardiac medications before your next appointment, please call your pharmacy*   Lab Work: None If you have labs (blood work) drawn today and your tests are completely normal, you will receive your results only by: [facility] (if you have MyChart) OR A paper copy in the mail If you have any lab test that is abnormal or we need to change your treatment, we will call you to review the results.   Follow-Up: At [facility], you and your health needs are our priority.  As part of our continuing mission to provide you with exceptional heart care, we have created designated Provider Care Teams.  These Care Teams include your primary Cardiologist (physician) and Advanced Practice Providers (APPs -  Physician Assistants and Nurse Practitioners) who all work together to provide you with the care you need, when you need it.  We recommend signing up for the patient portal called "MyChart".  Sign up information is provided on this After Visit Summary.  MyChart is used to connect with patients for Virtual Visits (Telemedicine).  Patients are able to view lab/test results, encounter notes, upcoming appointments, etc.  Non-urgent messages can be sent to your provider as well.   To learn more about what you can do with MyChart, go to [email or web address].    Your next appointment:   As scheduled

## 2020-09-08 ENCOUNTER — Telehealth: Payer: Self-pay

## 2020-09-08 DIAGNOSIS — M479 Spondylosis, unspecified: Secondary | ICD-10-CM | POA: Diagnosis not present

## 2020-09-08 DIAGNOSIS — I5032 Chronic diastolic (congestive) heart failure: Secondary | ICD-10-CM | POA: Diagnosis not present

## 2020-09-08 DIAGNOSIS — I25119 Atherosclerotic heart disease of native coronary artery with unspecified angina pectoris: Secondary | ICD-10-CM | POA: Diagnosis not present

## 2020-09-08 DIAGNOSIS — I083 Combined rheumatic disorders of mitral, aortic and tricuspid valves: Secondary | ICD-10-CM | POA: Diagnosis not present

## 2020-09-08 DIAGNOSIS — M170 Bilateral primary osteoarthritis of knee: Secondary | ICD-10-CM | POA: Diagnosis not present

## 2020-09-08 DIAGNOSIS — M545 Low back pain, unspecified: Secondary | ICD-10-CM | POA: Diagnosis not present

## 2020-09-08 DIAGNOSIS — I700 Atherosclerosis of aorta: Secondary | ICD-10-CM | POA: Diagnosis not present

## 2020-09-08 DIAGNOSIS — I110 Hypertensive heart disease with heart failure: Secondary | ICD-10-CM | POA: Diagnosis not present

## 2020-09-08 DIAGNOSIS — G8929 Other chronic pain: Secondary | ICD-10-CM | POA: Diagnosis not present

## 2020-09-08 NOTE — Telephone Encounter (Signed)
Spoke with the pts wife and she declined a Administrator, sports, I have advised her that the pt needs to be NPO 6 hours prior to his PET scan... I advised her that I have placed the order and we are just waiting for prior authorization. I will forward to the [facility] for further follow up.

## 2020-09-08 NOTE — Telephone Encounter (Signed)
[name] with [facility] is calling as an FYI to inform us that the pts BP is 162/102. Pt just took BP meds and is asymptomatic.  **Sent to MA/PCP

## 2020-09-09 ENCOUNTER — Telehealth: Payer: Self-pay | Admitting: Cardiovascular Disease

## 2020-09-09 ENCOUNTER — Telehealth: Payer: Self-pay | Admitting: Emergency Medicine

## 2020-09-09 DIAGNOSIS — M170 Bilateral primary osteoarthritis of knee: Secondary | ICD-10-CM | POA: Diagnosis not present

## 2020-09-09 DIAGNOSIS — I110 Hypertensive heart disease with heart failure: Secondary | ICD-10-CM | POA: Diagnosis not present

## 2020-09-09 DIAGNOSIS — I5032 Chronic diastolic (congestive) heart failure: Secondary | ICD-10-CM | POA: Diagnosis not present

## 2020-09-09 DIAGNOSIS — I25119 Atherosclerotic heart disease of native coronary artery with unspecified angina pectoris: Secondary | ICD-10-CM | POA: Diagnosis not present

## 2020-09-09 DIAGNOSIS — M545 Low back pain, unspecified: Secondary | ICD-10-CM | POA: Diagnosis not present

## 2020-09-09 DIAGNOSIS — G8929 Other chronic pain: Secondary | ICD-10-CM | POA: Diagnosis not present

## 2020-09-09 DIAGNOSIS — I700 Atherosclerosis of aorta: Secondary | ICD-10-CM | POA: Diagnosis not present

## 2020-09-09 DIAGNOSIS — M479 Spondylosis, unspecified: Secondary | ICD-10-CM | POA: Diagnosis not present

## 2020-09-09 DIAGNOSIS — I083 Combined rheumatic disorders of mitral, aortic and tricuspid valves: Secondary | ICD-10-CM | POA: Diagnosis not present

## 2020-09-09 NOTE — Telephone Encounter (Signed)
Spoke with PT regarding pt's BP. He states the last few visits, his BP has been elevated with no symptoms. He states his BP ranges in the 160's-180's, he wonders if he needs a medication change. He also states pt was under the impression Dr. Burt Knack would be his provider during his visit this week. He wants Dr. Burt Knack to explain his MRI results. His last OV note states it was explained to him in full detail, but he does not believe anyone discussed with him.  Will forward to Dr. Burt Knack for review and recommendation.

## 2020-09-09 NOTE — Telephone Encounter (Signed)
[name], [name] caretaker called for the patient.The patient was in the room with the caretaker and allowed [place] to speak with [name].  This morning the patient took his medication then immediately took his BP.The patient noticed his BP was high.  [name] was going to use this visit to help teach and advise how to take his BP properly.   Pt c/o BP issue: STAT if pt c/o blurred vision, one-sided weakness or slurred speech  1. What are your last 5 BP readings?  09/09/20 10:00 am: 180/100 (approx. 1 hour post medicine, both before and after exertion)   2. Are you having any other symptoms (ex. Dizziness, headache, blurred vision, passed out)? No. Patient was asymptomatic   3. What is your BP issue?  The patient's BP was high both before and after exertion   [name] also mentioned that there was some frustration the patient may have been feeling regarding his last office visit 09/07/20 with [name] as well as other potential environmental stressors that could have led to his elevated BP. Per [name], the patient was under the impression that his hospital follow up was going to be with his Doctor, [name]. He was also under the impression that the MRI needed to be done prior to this office visit and [name] would be giving the patient the results at his recent office visit.The patient states he was not told in advance that [name] would not be seeing him at his last visit. The patient would have rather just cancelled his recent visit if he was not going to see [name].The patient states he had to make significant adjustments to social calendar to make his last appointment.   The patient was also upset that no MRI results were available for him yet.  [name] also mentioned that the patient is not the type of person to call the office and wait on hold. He does not do well with technology and is the type of person that would just drive down to the office and talk to someone if he needed to .    If the office would need further clarification regarding this issue, please feel free to reach out to [name], who is the caller for this encounter.  If there are any changes to the patient's medication, please reach out to the patient. If the patient is not available, the patient's Sister [name] is the person that would need to be contacted as a follow up. [name] does not live in the same state as the patient, but she is in frequent contact with him.

## 2020-09-09 NOTE — Telephone Encounter (Signed)
[name] from [place] s returning a call

## 2020-09-09 NOTE — Telephone Encounter (Signed)
Pt c/o medication issue:  1. Name of Medication: furosemide (LASIX) 40 MG tablet  2. How are you currently taking this medication (dosage and times per day)? Patient currently taking one tablet daily  3. Are you having a reaction (difficulty breathing--STAT)?   4. What is your medication issue? Patient misunderstood dosage instructions. Please make sure dosage instructions are up to date if another caretaker would need to call for clarification.

## 2020-09-09 NOTE — Telephone Encounter (Signed)
Jim from [place] called   Reported chronic high BP in which they are working with Dr. Copper to adjust his medication. He thinks that the source of the high BP is elevated stress. The patient 's wife is dealing with some paranoia which caused a increase of stress.   Please advise.   Best contact [phone]

## 2020-09-09 NOTE — Telephone Encounter (Signed)
LVM for return call; ls

## 2020-09-10 ENCOUNTER — Emergency Department (HOSPITAL_COMMUNITY)
Admission: EM | Admit: 2020-09-10 | Discharge: 2020-09-11 | Disposition: A | Discharge status: Home / Self Care | Payer: Medicare HMO | Attending: Emergency Medicine | Admitting: Emergency Medicine

## 2020-09-10 ENCOUNTER — Encounter (HOSPITAL_COMMUNITY): Payer: Self-pay

## 2020-09-10 ENCOUNTER — Other Ambulatory Visit: Payer: Self-pay

## 2020-09-10 DIAGNOSIS — I110 Hypertensive heart disease with heart failure: Secondary | ICD-10-CM | POA: Insufficient documentation

## 2020-09-10 DIAGNOSIS — Z7982 Long term (current) use of aspirin: Secondary | ICD-10-CM | POA: Diagnosis not present

## 2020-09-10 DIAGNOSIS — Z951 Presence of aortocoronary bypass graft: Secondary | ICD-10-CM | POA: Insufficient documentation

## 2020-09-10 DIAGNOSIS — I2510 Atherosclerotic heart disease of native coronary artery without angina pectoris: Secondary | ICD-10-CM | POA: Diagnosis not present

## 2020-09-10 DIAGNOSIS — Z79899 Other long term (current) drug therapy: Secondary | ICD-10-CM | POA: Insufficient documentation

## 2020-09-10 DIAGNOSIS — I5033 Acute on chronic diastolic (congestive) heart failure: Secondary | ICD-10-CM | POA: Insufficient documentation

## 2020-09-10 DIAGNOSIS — Z7902 Long term (current) use of antithrombotics/antiplatelets: Secondary | ICD-10-CM | POA: Insufficient documentation

## 2020-09-10 DIAGNOSIS — I10 Essential (primary) hypertension: Secondary | ICD-10-CM

## 2020-09-10 DIAGNOSIS — E119 Type 2 diabetes mellitus without complications: Secondary | ICD-10-CM | POA: Insufficient documentation

## 2020-09-10 LAB — CBC WITH DIFFERENTIAL/PLATELET
Abs Immature Granulocytes: 0.02 10*3/uL (ref 0.00–0.07)
Basophils Absolute: 0.1 10*3/uL (ref 0.0–0.1)
Basophils Relative: 1 %
Eosinophils Absolute: 0.5 10*3/uL (ref 0.0–0.5)
Eosinophils Relative: 6 %
HCT: 42.6 % (ref 39.0–52.0)
Hemoglobin: 13.9 g/dL (ref 13.0–17.0)
Immature Granulocytes: 0 %
Lymphocytes Relative: 29 %
Lymphs Abs: 2.3 10*3/uL (ref 0.7–4.0)
MCH: 29.8 pg (ref 26.0–34.0)
MCHC: 32.6 g/dL (ref 30.0–36.0)
MCV: 91.2 fL (ref 80.0–100.0)
Monocytes Absolute: 0.5 10*3/uL (ref 0.1–1.0)
Monocytes Relative: 6 %
Neutro Abs: 4.6 10*3/uL (ref 1.7–7.7)
Neutrophils Relative %: 58 %
Platelets: 206 10*3/uL (ref 150–400)
RBC: 4.67 MIL/uL (ref 4.22–5.81)
RDW: 13.7 % (ref 11.5–15.5)
WBC: 7.9 10*3/uL (ref 4.0–10.5)
nRBC: 0 % (ref 0.0–0.2)

## 2020-09-10 LAB — COMPREHENSIVE METABOLIC PANEL
ALT: 14 U/L (ref 0–44)
AST: 17 U/L (ref 15–41)
Albumin: 3.3 g/dL — ABNORMAL LOW (ref 3.5–5.0)
Alkaline Phosphatase: 68 U/L (ref 38–126)
Anion gap: 6 (ref 5–15)
BUN: 13 mg/dL (ref 8–23)
CO2: 27 mmol/L (ref 22–32)
Calcium: 9.3 mg/dL (ref 8.9–10.3)
Chloride: 103 mmol/L (ref 98–111)
Creatinine, Ser: 0.97 mg/dL (ref 0.61–1.24)
GFR, Estimated: >60 mL/min (ref >60)
Glucose, Bld: 108 mg/dL — ABNORMAL HIGH (ref 70–99)
Potassium: 4.1 mmol/L (ref 3.5–5.1)
Sodium: 136 mmol/L (ref 135–145)
Total Bilirubin: 0.7 mg/dL (ref 0.3–1.2)
Total Protein: 6.8 g/dL (ref 6.5–8.1)

## 2020-09-10 LAB — URINALYSIS, ROUTINE W REFLEX MICROSCOPIC
Bacteria, UA: NONE SEEN
Bilirubin Urine: NEGATIVE
Glucose, UA: NEGATIVE mg/dL
Ketones, ur: NEGATIVE mg/dL
Leukocytes,Ua: NEGATIVE
Nitrite: NEGATIVE
Protein, ur: NEGATIVE mg/dL
Specific Gravity, Urine: 1.01 (ref 1.005–1.030)
pH: 7 (ref 5.0–8.0)

## 2020-09-10 MED ORDER — LORAZEPAM 1 MG PO TABS
1.0000 mg | ORAL_TABLET | Freq: Once | ORAL | Status: AC
  Administered 2020-09-10: 1 mg via ORAL
  Filled 2020-09-10: qty 1

## 2020-09-10 NOTE — ED Triage Notes (Signed)
Pt arrives POV for eval of hypertension. States he had an argument with family and noted that his BP was high States this has happened in the past and has had a similar reaction. Pt is tearful in triage, states he is upset because of his fight with family. Reports hx of HTN. Denies CP, denies N/V.

## 2020-09-10 NOTE — ED Provider Notes (Signed)
Emergency Medicine Provider Triage Evaluation Note  [name] , a 80 y.o. male  was evaluated in triage.  Pt complains of hypertension.  Patient states that his symptoms started this afternoon after getting in an argument with a relative.  His blood pressure has been persistently high so he came to the emergency department.  Per records, it appears that patient has been discussing this with his cardiologist and they are working to adjust his medications.  Patient denies any chest pain, shortness of breath, headaches.  He does note intermittent positional vertigo when bending over but states that this is been present for the past 3 to 4 months.  Physical Exam  There were no vitals taken for this visit. Gen:   Awake, tearful and anxious  Resp:  Normal effort  MSK:   Moves extremities without difficulty  Other:    Medical Decision Making  Medically screening exam initiated at 9:33 PM.  Appropriate orders placed.  Waring Woolard was informed that the remainder of the evaluation will be completed by another provider, this initial triage assessment does not replace that evaluation, and the importance of remaining in the ED until their evaluation is complete.   Rayna Sexton, PA-C 09/10/20 2138    Blanchie Dessert, MD 09/11/20 2330

## 2020-09-11 MED ORDER — AMLODIPINE BESYLATE 5 MG PO TABS
5.0000 mg | ORAL_TABLET | Freq: Once | ORAL | Status: AC
  Administered 2020-09-11: 5 mg via ORAL
  Filled 2020-09-11: qty 1

## 2020-09-11 MED ORDER — CARVEDILOL 12.5 MG PO TABS
12.5000 mg | ORAL_TABLET | Freq: Once | ORAL | Status: AC
  Administered 2020-09-11: 12.5 mg via ORAL
  Filled 2020-09-11: qty 1

## 2020-09-11 MED ORDER — AMLODIPINE BESYLATE 5 MG PO TABS: 5.0000 mg | ORAL_TABLET | Freq: Every day | ORAL | 1 refills | Status: DC

## 2020-09-11 MED ORDER — IRBESARTAN 300 MG PO TABS
300.0000 mg | ORAL_TABLET | Freq: Once | ORAL | Status: AC
  Administered 2020-09-11: 300 mg via ORAL
  Filled 2020-09-11: qty 1

## 2020-09-11 NOTE — ED Provider Notes (Signed)
[facility] EMERGENCY DEPARTMENT Provider Note   CSN: [id number] Arrival date & time: 09/10/20  2121     History Chief Complaint  Patient presents with   Hypertension    [name] is a 80 y.o. male.  The history is provided by the patient.  Hypertension This is a chronic problem. The problem occurs daily. The problem has not changed since onset.Pertinent negatives include no chest pain, no abdominal pain, no headaches and no shortness of breath. Nothing aggravates the symptoms. Nothing relieves the symptoms. He has tried nothing for the symptoms. The treatment provided mild relief.      Past Medical History:  Diagnosis Date   Blurred vision 05/17/2016   CAD (coronary artery disease), native coronary artery 06/14/2008   a. BMS to LCx & OM 2008 b. 03/2017 CABG x 4 (LIMA to LAD, SVG to diag 1, SVG to distal Circ, SVG to PDA)   Chest pain 04/16/2012   Diplopia    Echocardiogram    Echo 10/19: mod LVH, EF 55-60, no RWMA, Gr 1 DD, trivial AI, MAC, mod LAE, prob small post effusion   Essential hypertension 05/17/2016   Hyperlipidemia with target low density lipoprotein (LDL) cholesterol less than 70 mg/dL 06/14/2008   Qualifier: Diagnosis of  By: Mare Ferrari, RMA, [name]     Hypertension    Hypertensive heart disease 06/14/2008   Qualifier: Diagnosis of  By: Mare Ferrari, RMA, [name]     Hypertensive urgency, malignant 04/16/2012   Hypokalemia 04/14/2017   Internal hemorrhoids 04/19/2012   Ischemic stroke ([place]) 05/02/2016   Non-ST elevation (NSTEMI) myocardial infarction [facility])    pandiverticulosis 04/22/2012   12/17/2011. [place]. Juanita Craver MD. Colonoscopy. Moderate sized internal hemorrhoids and extensive pandiverticulosis. Repeat 5 years.     Pericardial effusion    Echo 11/19: mild focal basal septal hypertrophy, EF 55-60, Gr 2 DD, trivial AI, trivial TR, trivial PI, small to mod eff post to heart - no evidence of RV collapse.>> repeat limited echo in  03/2018 // Echo 03/2018: EF 55-60, mild LVH, +diastolic dysfunction, no RWMA, PASP 23, trivial pericardial effusion    Polycythemia vera(238.4) 04/17/2012   S/P CABG x 4 04/17/2017   Syncope and collapse 04/17/2012   Pt syncopized while sitting in bed giving history @ time of admission to hospital Tachycardic (appeared sinus) to 120s-130s and hypotensive to 50s/30s.  Unresponsive initially >> Spontaneously resolved after 2-3 minutes >> return to baseline ~30 minutes    Vertigo     Patient Active Problem List   Diagnosis Date Noted   Chest pain 08/24/2020   Chronic low back pain 08/24/2020   Angina of effort ([place]) 08/08/2020   Type 2 diabetes mellitus with other specified complication, without long-term current use of insulin ([place]) 05/31/2020   Aortic atherosclerosis ([place]) 05/31/2020   Acute on chronic diastolic heart failure ([place]) 02/14/2018   Pericardial effusion    Abnormal CT of the chest 11/05/2017   Non-ST elevation (NSTEMI) myocardial infarction [facility])    Essential hypertension 05/17/2016   Ischemic stroke ([place]) 05/02/2016   pandiverticulosis 04/22/2012   Internal hemorrhoids 04/19/2012   Acute chest pain 04/16/2012   Hyperlipidemia with target low density lipoprotein (LDL) cholesterol less than 70 mg/dL 06/14/2008   Hypertensive heart disease 06/14/2008   Coronary artery disease of native heart with stable angina pectoris, unspecified vessel or lesion type ([place]) 06/14/2008    Past Surgical History:  Procedure Laterality Date   CORONARY ARTERY BYPASS GRAFT N/A 04/17/2017  Procedure: CORONARY ARTERY BYPASS GRAFTING (CABG) x4 , using left internal mammary artery  to LAD and right leg greater saphenous vein harvested endoscopically  to PDA, Diagonal I and Circumflex;  Surgeon: [name], MD;  Location: [place];  Service: Open Heart Surgery;  Laterality: N/A;   CORONARY ARTERY BYPASS GRAFT  2020   CORONARY STENT PLACEMENT     LEFT HEART CATH AND CORONARY ANGIOGRAPHY N/A  04/16/2017   Procedure: LEFT HEART CATH AND CORONARY ANGIOGRAPHY;  Surgeon: Sherren Mocha, MD;  Location: [place] [facility];  Service: Cardiovascular;  Laterality: N/A;   TEE WITHOUT CARDIOVERSION N/A 04/17/2017   Procedure: TRANSESOPHAGEAL ECHOCARDIOGRAM (TEE);  Surgeon: [name], MD;  Location: [place];  Service: Open Heart Surgery;  Laterality: N/A;       Family History  Problem Relation Age of Onset   Heart disease Mother    Cancer Sister     Social History   Tobacco Use   Smoking status: Never   Smokeless tobacco: Never  Vaping Use   Vaping Use: Never used  Substance Use Topics   Alcohol use: No    Alcohol/week: 0.0 standard drinks   Drug use: No    Home Medications Prior to Admission medications   Medication Sig Start Date End Date Taking? Authorizing Provider  amLODipine (NORVASC) 5 MG tablet Take 1 tablet (5 mg total) by mouth daily. 09/11/20 10/11/20 Yes Anupama Piehl, DO  acetaminophen (TYLENOL) 325 MG tablet Take 325 mg by mouth daily.    [provider]  aspirin EC 81 MG tablet Take 81 mg by mouth daily.    [provider]  carvedilol (COREG) 12.5 MG tablet Take 1 tablet (12.5 mg total) by mouth 2 (two) times daily. 07/27/20 07/22/21  Sherren Mocha, MD  clopidogrel (PLAVIX) 75 MG tablet Take 1 tablet (75 mg total) by mouth daily. 08/19/20 08/14/21  Sherren Mocha, MD  furosemide (LASIX) 40 MG tablet Take 1 tablet (40 mg total) by mouth every other day. 08/19/20 08/14/21  Sherren Mocha, MD  pantoprazole (PROTONIX) 40 MG tablet Take 1 tablet (40 mg total) by mouth daily. 08/11/20   Patrecia Pour, MD  potassium chloride SA (KLOR-CON) 20 MEQ tablet Take 1 tablet (20 mEq total) by mouth every other day. 04/07/20 04/02/21  Leanor Kail, PA  rosuvastatin (CRESTOR) 40 MG tablet Take 1 tablet (40 mg total) by mouth daily. 08/19/20 08/14/21  Sherren Mocha, MD  valsartan (DIOVAN) 320 MG tablet Take 1 tablet (320 mg total) by mouth daily. 08/19/20    Sherren Mocha, MD    Allergies    Lipitor [atorvastatin], Morphine and related, and Pork-derived products  Review of Systems   Review of Systems  Constitutional:  Negative for chills and fever.  HENT:  Negative for ear pain and sore throat.   Eyes:  Negative for pain and visual disturbance.  Respiratory:  Negative for cough and shortness of breath.   Cardiovascular:  Negative for chest pain and palpitations.  Gastrointestinal:  Negative for abdominal pain and vomiting.  Genitourinary:  Negative for dysuria and hematuria.  Musculoskeletal:  Negative for arthralgias and back pain.  Skin:  Negative for color change and rash.  Neurological:  Negative for seizures, syncope and headaches.  All other systems reviewed and are negative.  Physical Exam Updated Vital Signs  ED Triage Vitals  Enc Vitals Group     BP 09/10/20 2135 (!) 190/109     Pulse Rate 09/10/20 2135 65  Resp 09/10/20 2135 18     Temp 09/10/20 2135 98.7 F (37.1 C)     Temp Source 09/11/20 0117 Oral     SpO2 09/10/20 2135 98 %     Weight 09/10/20 2135 180 lb 12.4 oz (82 kg)     Height 09/10/20 2135 _0  (1.676 m)     Head Circumference --      Peak Flow --      Pain Score 09/10/20 2135 5     Pain Loc --      Pain Edu? --      Excl. in [place]? --      Physical Exam Vitals and nursing note reviewed.  Constitutional:      General: He is not in acute distress.    Appearance: He is well-developed. He is not ill-appearing.  HENT:     Head: Normocephalic and atraumatic.     Nose: Nose normal.     Mouth/Throat:     Mouth: Mucous membranes are moist.  Eyes:     Extraocular Movements: Extraocular movements intact.     Conjunctiva/sclera: Conjunctivae normal.     Pupils: Pupils are equal, round, and reactive to light.  Cardiovascular:     Rate and Rhythm: Normal rate and regular rhythm.     Pulses: Normal pulses.     Heart sounds: Normal heart sounds. No murmur heard. Pulmonary:     Effort: Pulmonary  effort is normal. No respiratory distress.     Breath sounds: Normal breath sounds.  Abdominal:     Palpations: Abdomen is soft.     Tenderness: There is no abdominal tenderness.  Musculoskeletal:     Cervical back: Normal range of motion and neck supple.  Skin:    General: Skin is warm and dry.     Capillary Refill: Capillary refill takes less than 2 seconds.  Neurological:     General: No focal deficit present.     Mental Status: He is alert and oriented to person, place, and time.     Cranial Nerves: No cranial nerve deficit.     Sensory: No sensory deficit.     Motor: No weakness.     Comments: 5+ out of 5 strength throughout, normal sensation, no drift, normal finger-to-nose finger, normal speech  Psychiatric:        Mood and Affect: Mood normal.    ED Results / Procedures / Treatments   Labs (all labs ordered are listed, but only abnormal results are displayed) Labs Reviewed  COMPREHENSIVE METABOLIC PANEL - Abnormal; Notable for the following components:      Result Value   Glucose, Bld 108 (*)    Albumin 3.3 (*)    All other components within normal limits  URINALYSIS, ROUTINE W REFLEX MICROSCOPIC - Abnormal; Notable for the following components:   Color, Urine STRAW (*)    Hgb urine dipstick SMALL (*)    All other components within normal limits  CBC WITH DIFFERENTIAL/PLATELET    EKG EKG Interpretation  Date/Time:  Saturday September 10 2020 21:35:28 EDT Ventricular Rate:  67 PR Interval:  154 QRS Duration: 70 QT Interval:  380 QTC Calculation: 401 R Axis:   42 Text Interpretation: Normal sinus rhythm Normal ECG Confirmed by Ronnald Nian, Kentavius Dettore ([id number]) on 09/11/2020 8:14:30 AM  Radiology No results found.  Procedures Procedures   Medications Ordered in ED Medications  irbesartan (AVAPRO) tablet 300 mg (has no administration in time range)  LORazepam (ATIVAN) tablet 1 mg (1 mg Oral  Given 09/10/20 2141)  carvedilol (COREG) tablet 12.5 mg (12.5 mg Oral Given 09/11/20  0822)  amLODipine (NORVASC) tablet 5 mg (5 mg Oral Given 09/11/20 0034)    ED Course  I have reviewed the triage vital signs and the nursing notes.  Pertinent labs & imaging results that were available during my care of the patient were reviewed by me and considered in my medical decision making (see chart for details).    MDM Rules/Calculators/A&P                           Baylee Mccorkel is here with concern about blood pressure.  Patient with history of high blood pressure.  Denies any chest pain, headache, neurologic symptoms.  Neurologically he is intact.  Well-appearing.  Exam is unremarkable.  EKG shows sinus rhythm with no ischemic changes.  Basic labs show no significant electrolyte abnormality, kidney injury.  Urinalysis negative for infection.  Home blood pressure medications were given and patient to be started on amlodipine.  We will have him follow-up with his primary care doctor.  Discharged in good condition.  This chart was dictated using voice recognition software.  Despite best efforts to proofread,  errors can occur which can change the documentation meaning.   Final Clinical Impression(s) / ED Diagnoses Final diagnoses:  Hypertension, unspecified type    Rx / DC Orders ED Discharge Orders          Ordered    amLODipine (NORVASC) 5 MG tablet  Daily        09/11/20 [place], Gervais, DO 09/11/20 [phone]

## 2020-09-11 NOTE — Discharge Instructions (Addendum)
Start amlodipine.  Continue other blood pressure medications.

## 2020-09-12 DIAGNOSIS — I700 Atherosclerosis of aorta: Secondary | ICD-10-CM | POA: Diagnosis not present

## 2020-09-12 DIAGNOSIS — I25119 Atherosclerotic heart disease of native coronary artery with unspecified angina pectoris: Secondary | ICD-10-CM | POA: Diagnosis not present

## 2020-09-12 DIAGNOSIS — I110 Hypertensive heart disease with heart failure: Secondary | ICD-10-CM | POA: Diagnosis not present

## 2020-09-12 DIAGNOSIS — M170 Bilateral primary osteoarthritis of knee: Secondary | ICD-10-CM | POA: Diagnosis not present

## 2020-09-12 DIAGNOSIS — I083 Combined rheumatic disorders of mitral, aortic and tricuspid valves: Secondary | ICD-10-CM | POA: Diagnosis not present

## 2020-09-12 DIAGNOSIS — G8929 Other chronic pain: Secondary | ICD-10-CM | POA: Diagnosis not present

## 2020-09-12 DIAGNOSIS — M479 Spondylosis, unspecified: Secondary | ICD-10-CM | POA: Diagnosis not present

## 2020-09-12 DIAGNOSIS — I5032 Chronic diastolic (congestive) heart failure: Secondary | ICD-10-CM | POA: Diagnosis not present

## 2020-09-12 DIAGNOSIS — M545 Low back pain, unspecified: Secondary | ICD-10-CM | POA: Diagnosis not present

## 2020-09-12 NOTE — Telephone Encounter (Signed)
[name], PT called to report that pt had a stressful experience on Saturday and went to ER.  Noted to be hypertensive.  Was given Amlodipine '5mg'$  QD.  Today [name] did moderate-maximal exercise with pt and BP was noted to be 190/90 and pt was SOB.  Pt had not taken his Amlodipine telling [name] because he had things to do and planned to take it later.  15 minutes after taking medication, BP was 160/90 and pt was asymptomatic.  Advised to have pt recheck his BP 2 hours after medication and call our office with reading.  [name] states that pt will not call the office, he will just get in the car and drive over here.  Advised I will send message to triage team and have them reach out to patient around 1pm and to make sure pt knows to take his BP prior.  [name] in agreement with plan.

## 2020-09-13 DIAGNOSIS — M170 Bilateral primary osteoarthritis of knee: Secondary | ICD-10-CM | POA: Diagnosis not present

## 2020-09-13 DIAGNOSIS — M479 Spondylosis, unspecified: Secondary | ICD-10-CM | POA: Diagnosis not present

## 2020-09-13 DIAGNOSIS — M545 Low back pain, unspecified: Secondary | ICD-10-CM | POA: Diagnosis not present

## 2020-09-13 DIAGNOSIS — I110 Hypertensive heart disease with heart failure: Secondary | ICD-10-CM | POA: Diagnosis not present

## 2020-09-13 DIAGNOSIS — I700 Atherosclerosis of aorta: Secondary | ICD-10-CM | POA: Diagnosis not present

## 2020-09-13 DIAGNOSIS — I5032 Chronic diastolic (congestive) heart failure: Secondary | ICD-10-CM | POA: Diagnosis not present

## 2020-09-13 DIAGNOSIS — G8929 Other chronic pain: Secondary | ICD-10-CM | POA: Diagnosis not present

## 2020-09-13 DIAGNOSIS — I25119 Atherosclerotic heart disease of native coronary artery with unspecified angina pectoris: Secondary | ICD-10-CM | POA: Diagnosis not present

## 2020-09-13 DIAGNOSIS — I083 Combined rheumatic disorders of mitral, aortic and tricuspid valves: Secondary | ICD-10-CM | POA: Diagnosis not present

## 2020-09-13 NOTE — Telephone Encounter (Signed)
Upon investigation of chart, pt presented to ED for HTN - see below "[name] is here with concern about blood pressure.  Patient with history of high blood pressure.  Denies any chest pain, headache, neurologic symptoms.  Neurologically he is intact.  Well-appearing.  Exam is unremarkable.  EKG shows sinus rhythm with no ischemic changes.  Basic labs show no significant electrolyte abnormality, kidney injury.  Urinalysis negative for infection.  Home blood pressure medications were given and patient to be started on amlodipine.  We will have him follow-up with his primary care doctor.  Discharged in good condition. [name] , a 80 y.o. male  was evaluated in triage.  Pt complains of hypertension.  Patient states that his symptoms started this afternoon after getting in an argument with a relative.  His blood pressure has been persistently high so he came to the emergency department.  Per records, it appears that patient has been discussing this with his cardiologist and they are working to adjust his medications.  Patient denies any chest pain, shortness of breath, headaches.  He does note intermittent positional vertigo when bending over but states that this is been present for the past 3 to 4 months." Pt has f/u appt with PCP on 09/20/20

## 2020-09-14 NOTE — Telephone Encounter (Signed)
Spoke with pt and he denies any issues with Furosemide.  States he's not sure what this call was about.  He has completed course of Furosemide.

## 2020-09-14 NOTE — Telephone Encounter (Signed)
Called [name] using interpreter Josalynn [phone]).  Wife answered and we were able to disconnect with interpreter.  Wife mentioned that they went out to breakfast this morning and [name] suddenly started feeling bad so that had to leave quickly.  She then put [name] on the phone who speaks [place].  [name] states he felt dizzy.  Came home and rested and is now feeling fine.  Had only ate a couple of bites of egg and toast.  Spoke briefly about the call from [name] the other day.  [name] gave a list of BPs:  Saturday- 134/68, 63 Sunday- 145/78, 53 Monday- 144/74, 62 Tuesday- 146/85, 66 Wednesday- 125/67, 62  All of these are after his morning medications including Amlodipine.  Denies any recurring SOB.  States he gets a little winded with [name] but resolves with rest.  [name] seen last week by Ermalinda Barrios, PA-C to discuss MRI.  PET scan ordered at that time and still awaiting to be scheduled.  Advised [name] to continue what he has been doing and I will update Dr. Burt Knack and we will call back if any further recommendations.  I did advise to check BP if dizziness occurs again.  [name] very appreciative for call.

## 2020-09-15 ENCOUNTER — Telehealth: Payer: Self-pay | Admitting: *Deleted

## 2020-09-15 NOTE — Telephone Encounter (Signed)
Per Cc'd chart from Dr. Burt Knack:  "Lots of recent telephone notes and communications regarding this patient.  I called him this morning to try to check in and make sure that he is doing okay.  There was no answer but I left a detailed message on his voicemail.  If he calls back and indicates that an office visit would be helpful, I would be happy to see him in review his recent evaluation, medications, and answer any questions he may have.  I am trying to add an office next week on Tuesday afternoon if there is staffing available."

## 2020-09-15 NOTE — Progress Notes (Signed)
Lots of recent telephone notes and communications regarding this patient.  I called him this morning to try to check in and make sure that he is doing okay.  There was no answer but I left a detailed message on his voicemail.  If he calls back and indicates that an office visit would be helpful, I would be happy to see him in review his recent evaluation, medications, and answer any questions he may have.  I am trying to add an office next week on Tuesday afternoon if there is staffing available.

## 2020-09-15 NOTE — Telephone Encounter (Signed)
Got a message on my voicemail from dr Arvil Persons office a cardiovascular consultant for a peer to peer for dr Burt Knack. Doctor Mckinney's direct line is [phone],  Case # [id number]. I am forwarding this message to dr Burt Knack and lauren (RN).

## 2020-09-15 NOTE — Telephone Encounter (Signed)
Spoke with Dr Arvil Persons staff and the study has been approved. They advised the approval information has been sent to our office.

## 2020-09-19 DIAGNOSIS — I5032 Chronic diastolic (congestive) heart failure: Secondary | ICD-10-CM | POA: Diagnosis not present

## 2020-09-19 DIAGNOSIS — I110 Hypertensive heart disease with heart failure: Secondary | ICD-10-CM | POA: Diagnosis not present

## 2020-09-19 DIAGNOSIS — M545 Low back pain, unspecified: Secondary | ICD-10-CM | POA: Diagnosis not present

## 2020-09-19 DIAGNOSIS — M479 Spondylosis, unspecified: Secondary | ICD-10-CM | POA: Diagnosis not present

## 2020-09-19 DIAGNOSIS — G8929 Other chronic pain: Secondary | ICD-10-CM | POA: Diagnosis not present

## 2020-09-19 DIAGNOSIS — I083 Combined rheumatic disorders of mitral, aortic and tricuspid valves: Secondary | ICD-10-CM | POA: Diagnosis not present

## 2020-09-19 DIAGNOSIS — M170 Bilateral primary osteoarthritis of knee: Secondary | ICD-10-CM | POA: Diagnosis not present

## 2020-09-19 DIAGNOSIS — I700 Atherosclerosis of aorta: Secondary | ICD-10-CM | POA: Diagnosis not present

## 2020-09-19 DIAGNOSIS — I25119 Atherosclerotic heart disease of native coronary artery with unspecified angina pectoris: Secondary | ICD-10-CM | POA: Diagnosis not present

## 2020-09-20 ENCOUNTER — Other Ambulatory Visit: Payer: Self-pay

## 2020-09-20 ENCOUNTER — Ambulatory Visit (INDEPENDENT_AMBULATORY_CARE_PROVIDER_SITE_OTHER): Payer: Medicare HMO | Admitting: Emergency Medicine

## 2020-09-20 ENCOUNTER — Encounter: Payer: Self-pay | Admitting: Emergency Medicine

## 2020-09-20 VITALS — BP 124/62 | HR 63 | Temp 98.4°F | Ht 66.0 in | Wt 179.0 lb

## 2020-09-20 DIAGNOSIS — R2681 Unsteadiness on feet: Secondary | ICD-10-CM | POA: Diagnosis not present

## 2020-09-20 DIAGNOSIS — I700 Atherosclerosis of aorta: Secondary | ICD-10-CM | POA: Diagnosis not present

## 2020-09-20 DIAGNOSIS — I110 Hypertensive heart disease with heart failure: Secondary | ICD-10-CM | POA: Diagnosis not present

## 2020-09-20 DIAGNOSIS — I25118 Atherosclerotic heart disease of native coronary artery with other forms of angina pectoris: Secondary | ICD-10-CM | POA: Diagnosis not present

## 2020-09-20 DIAGNOSIS — R2689 Other abnormalities of gait and mobility: Secondary | ICD-10-CM | POA: Insufficient documentation

## 2020-09-20 DIAGNOSIS — I5032 Chronic diastolic (congestive) heart failure: Secondary | ICD-10-CM | POA: Diagnosis not present

## 2020-09-20 DIAGNOSIS — I10 Essential (primary) hypertension: Secondary | ICD-10-CM | POA: Diagnosis not present

## 2020-09-20 DIAGNOSIS — I313 Pericardial effusion (noninflammatory): Secondary | ICD-10-CM

## 2020-09-20 DIAGNOSIS — I3139 Other pericardial effusion (noninflammatory): Secondary | ICD-10-CM

## 2020-09-20 DIAGNOSIS — R269 Unspecified abnormalities of gait and mobility: Secondary | ICD-10-CM | POA: Insufficient documentation

## 2020-09-20 MED ORDER — PANTOPRAZOLE SODIUM 40 MG PO TBEC: 40.0000 mg | DELAYED_RELEASE_TABLET | Freq: Every day | ORAL | 3 refills | Status: DC

## 2020-09-20 MED ORDER — AMLODIPINE BESYLATE 5 MG PO TABS: 5.0000 mg | ORAL_TABLET | Freq: Every day | ORAL | 3 refills | Status: DC

## 2020-09-20 NOTE — Assessment & Plan Note (Signed)
Continue rosuvastatin 40mg daily  

## 2020-09-20 NOTE — Assessment & Plan Note (Signed)
Cardiac MRI report reviewed with patient.  Scheduled for PET scan next week.

## 2020-09-20 NOTE — Patient Instructions (Signed)
Hipertensi?n en los adultos ?Hypertension, Adult ?El t?rmino hipertensi?n es otra forma de denominar a la presi?n arterial elevada. La presi?n arterial elevada fuerza al coraz?n a trabajar m?s para bombear la sangre. Esto puede causar problemas con el paso del tiempo. ?Una lectura de presi?n arterial est? compuesta por 2 n?meros. Hay un n?mero superior (sist?lico) sobre un n?mero inferior (diast?lico). Lo ideal es tener la presi?n arterial por debajo de 120/80. Las elecciones saludables pueden ayudar a bajar la presi?n arterial, o tal vez necesite medicamentos para bajarla. ??Cu?les son las causas? ?Se desconoce la causa de esta afecci?n. Algunas afecciones pueden estar relacionadas con la presi?n arterial alta. ??Qu? incrementa el riesgo? ?Fumar. ?Tener diabetes mellitus tipo 2, colesterol alto, o ambos. ?No hacer la cantidad suficiente de actividad f?sica o ejercicio. ?Tener sobrepeso. ?Consumir mucha grasa, az?car, calor?as o sal (sodio) en su dieta. ?Beber alcohol en exceso. ?Tener una enfermedad renal a largo plazo (cr?nica). ?Tener antecedentes familiares de presi?n arterial alta. ?Edad. Los riesgos aumentan con la edad. ?Raza. El riesgo es mayor para las personas afroamericanas. ?Sexo. Antes de los 45 a?os, los hombres corren m?s riesgo que las mujeres. Despu?s de los 65 a?os, las mujeres corren m?s riesgo que los hombres. ?Tener apnea obstructiva del sue?o. ?Estr?s. ??Cu?les son los signos o los s?ntomas? ?Es posible que la presi?n arterial alta puede no cause s?ntomas. La presi?n arterial muy alta (crisis hipertensiva) puede provocar: ?Dolor de cabeza. ?Sensaciones de preocupaci?n o nerviosismo (ansiedad). ?Falta de aire. ?Hemorragia nasal. ?Sensaci?n de malestar en el est?mago (n?useas). ?V?mitos. ?Cambios en la forma de ver. ?Dolor muy intenso en el pecho. ?Convulsiones. ??C?mo se trata? ?Esta afecci?n se trata haciendo cambios saludables en el estilo de vida, por ejemplo: ?Consumir alimentos  saludables. ?Hacer m?s ejercicio. ?Beber menos alcohol. ?El m?dico puede recetarle medicamentos si los cambios en el estilo de vida no son suficientes para lograr controlar la presi?n arterial y si: ?El n?mero de arriba est? por encima de 130. ?El n?mero de abajo est? por encima de 80. ?Su presi?n arterial personal ideal puede variar. ?Siga estas instrucciones en su casa: ?Comida y bebida ? ?Si se lo dicen, siga el plan de alimentaci?n de DASH (Dietary Approaches to Stop Hypertension, Maneras de alimentarse para detener la hipertensi?n). Para seguir este plan: ?Llene la mitad del plato de cada comida con frutas y verduras. ?Llene un cuarto del plato de cada comida con cereales integrales. Los cereales integrales incluyen pasta integral, arroz integral y pan integral. ?Coma y beba productos l?cteos con bajo contenido de grasa, como leche descremada o yogur bajo en grasas. ?Llene un cuarto del plato de cada comida con prote?nas bajas en grasa (magras). Las prote?nas bajas en grasa incluyen pescado, pollo sin piel, huevos, frijoles y tofu. ?Evite consumir carne grasa, carne curada y procesada, o pollo con piel. ?Evite consumir alimentos prehechos o procesados. ?Consuma menos de 1500 mg de sal por d?a. ?No beba alcohol si: ?El m?dico le indica que no lo haga. ?Est? embarazada, puede estar embarazada o est? tratando de quedar embarazada. ?Si bebe alcohol: ?Limite la cantidad que bebe a lo siguiente: ?De 0 a 1 medida por d?a para las mujeres. ?De 0 a 2 medidas por d?a para los hombres. ?Est? atento a la cantidad de alcohol que hay en las bebidas que toma. En los [place], una medida equivale a una botella de cerveza de 12 oz (355 ml), un vaso de vino de 5 oz (148 ml) o un vaso de una bebida alcoh?lica de   alta graduaci?n de 1? oz (44 ml). ?Estilo de vida ? ?Trabaje con su m?dico para mantenerse en un peso saludable o para perder peso. Preg?ntele a su m?dico cu?l es el peso recomendable para usted. ?Haga al menos 30  minutos de ejercicio la mayor?a de los d?as de la semana. Estos pueden incluir caminar, nadar o andar en bicicleta. ?Realice al menos 30 minutos de ejercicio que fortalezca sus m?sculos (ejercicios de resistencia) al menos 3 d?as a la semana. Estos pueden incluir levantar pesas o hacer [place]. ?No consuma ning?n producto que contenga nicotina o tabaco, como cigarrillos, cigarrillos electr?nicos y tabaco de mascar. Si necesita ayuda para dejar de fumar, consulte al m?dico. ?Controle su presi?n arterial en su casa tal como le indic? el m?dico. ?Concurra a todas las visitas de seguimiento como se lo haya indicado el m?dico. Esto es importante. ?Medicamentos ?Tome los medicamentos de venta libre y los recetados solamente como se lo haya indicado el m?dico. Siga cuidadosamente las indicaciones. ?No omita las dosis de medicamentos para la presi?n arterial. Los medicamentos pierden eficacia si omite dosis. El hecho de omitir las dosis tambi?n aumenta el riesgo de otros problemas. ?Preg?ntele a su m?dico a qu? efectos secundarios o reacciones a los medicamentos debe prestar atenci?n. ?Comun?quese con un m?dico si: ?Piensa que tiene una reacci?n a los medicamentos que est? tomando. ?Tiene dolores de cabeza frecuentes (recurrentes). ?Se siente mareado. ?Tiene hinchaz?n en los tobillos. ?Tiene problemas de visi?n. ?Solicite ayuda inmediatamente si: ?Siente un dolor de cabeza muy intenso. ?Empieza a sentirse desorientado (confundido). ?Se siente d?bil o adormecido. ?Siente que va a desmayarse. ?Tiene un dolor muy intenso en las siguientes zonas: ?Pecho. ?Vientre (abdomen). ?Vomita m?s de una vez. ?Tiene dificultad para respirar. ?Resumen ?El t?rmino hipertensi?n es otra forma de denominar a la presi?n arterial elevada. ?La presi?n arterial elevada fuerza al coraz?n a trabajar m?s para bombear la sangre. ?Para la mayor?a de las personas, una presi?n arterial normal es menor que 120/80. ?Las decisiones saludables pueden ayudarle  a disminuir su presi?n arterial. Si no puede bajar su presi?n arterial mediante decisiones saludables, es posible que deba tomar medicamentos. ?Esta informaci?n no tiene como fin reemplazar el consejo del m?dico. Aseg?rese de hacerle al m?dico cualquier pregunta que tenga. ?Document Revised: 11/21/2017 Document Reviewed: 11/21/2017 ?Elsevier Patient Education ? 2022 Elsevier Inc. ? ?

## 2020-09-20 NOTE — Assessment & Plan Note (Signed)
Better than before but still showing some balance problems.  Needs evaluation by neurology.

## 2020-09-20 NOTE — Assessment & Plan Note (Signed)
No recent chest pain episodes.  Recent cardiac evaluation stable. Recent echocardiogram report reviewed with patient.  Normal LV function with good ejection fraction.

## 2020-09-20 NOTE — Assessment & Plan Note (Signed)
Clinically euvolemic.  Continue blood pressure medications Continue Lasix 40 mg daily.

## 2020-09-20 NOTE — Progress Notes (Signed)
BP Readings from Last 3 Encounters:  09/20/20 124/62  09/11/20 (!) 181/100  09/07/20 120/64   [name] 80 y.o.   Chief Complaint  Patient presents with   Hospitalization Follow-up    Dizziness, high blood pressure, loss of balance    HISTORY OF PRESENT ILLNESS: This is a 80 y.o. male here for follow-up of emergency department visit on 09/10/2020 when he presented with elevated blood pressure. Work-up was negative with normal blood work.  Started on amlodipine 5 mg daily with good results. Also taking valsartan 320 mg daily and carvedilol 12.5 mg twice a day. Also takes 1 baby aspirin 75 mg daily. Recent cardiology office visit reviewed with patient.  Recent cardiac MRI showed pericardial effusion believed to be secondary to old CABG surgery.  Scheduled for PET scan in 1 week. Has been dealing with gait issues and balance problems for the past several months.  Was referred to neurology by me but was never scheduled.  We will place another referral today. No other complaints or medical concerns today. Lots of stress at home due to situation with his wife.  HPI   Prior to Admission medications   Medication Sig Start Date End Date Taking? Authorizing Provider  acetaminophen (TYLENOL) 325 MG tablet Take 325 mg by mouth daily.   Yes [provider]  amLODipine (NORVASC) 5 MG tablet Take 1 tablet (5 mg total) by mouth daily. 09/11/20 10/11/20 Yes Curatolo, Adam, DO  aspirin EC 81 MG tablet Take 81 mg by mouth daily.   Yes [provider]  carvedilol (COREG) 12.5 MG tablet Take 1 tablet (12.5 mg total) by mouth 2 (two) times daily. 07/27/20 07/22/21 Yes Sherren Mocha, MD  clopidogrel (PLAVIX) 75 MG tablet Take 1 tablet (75 mg total) by mouth daily. 08/19/20 08/14/21 Yes Sherren Mocha, MD  furosemide (LASIX) 40 MG tablet Take 1 tablet (40 mg total) by mouth every other day. 08/19/20 08/14/21 Yes Sherren Mocha, MD  pantoprazole (PROTONIX) 40 MG tablet Take 1 tablet (40 mg  total) by mouth daily. 08/11/20  Yes Patrecia Pour, MD  potassium chloride SA (KLOR-CON) 20 MEQ tablet Take 1 tablet (20 mEq total) by mouth every other day. 04/07/20 04/02/21 Yes Bhagat, Bhavinkumar, PA  rosuvastatin (CRESTOR) 40 MG tablet Take 1 tablet (40 mg total) by mouth daily. 08/19/20 08/14/21 Yes Sherren Mocha, MD  valsartan (DIOVAN) 320 MG tablet Take 1 tablet (320 mg total) by mouth daily. 08/19/20  Yes Sherren Mocha, MD    Allergies  Allergen Reactions   Lipitor [Atorvastatin] Shortness Of Breath and Other (See Comments)    Sores non head   Morphine And Related Shortness Of Breath, Swelling and Rash    Throat swelling   Pork-Derived Products Swelling and Rash    Tongue swelling No pork products -     Patient Active Problem List   Diagnosis Date Noted   Chest pain 08/24/2020   Chronic low back pain 08/24/2020   Angina of effort ([place]) 08/08/2020   Type 2 diabetes mellitus with other specified complication, without long-term current use of insulin ([place]) 05/31/2020   Aortic atherosclerosis ([place]) 05/31/2020   Acute on chronic diastolic heart failure ([place]) 02/14/2018   Pericardial effusion    Abnormal CT of the chest 11/05/2017   Non-ST elevation (NSTEMI) myocardial infarction [facility])    Essential hypertension 05/17/2016   Ischemic stroke ([place]) 05/02/2016   pandiverticulosis 04/22/2012   Internal hemorrhoids 04/19/2012   Acute chest pain 04/16/2012   Hyperlipidemia with target  low density lipoprotein (LDL) cholesterol less than 70 mg/dL 06/14/2008   Hypertensive heart disease 06/14/2008   Coronary artery disease of native heart with stable angina pectoris, unspecified vessel or lesion type ([place]) 06/14/2008    Past Medical History:  Diagnosis Date   Blurred vision 05/17/2016   CAD (coronary artery disease), native coronary artery 06/14/2008   a. BMS to LCx & OM 2008 b. 03/2017 CABG x 4 (LIMA to LAD, SVG to diag 1, SVG to distal Circ, SVG to PDA)   Chest pain 04/16/2012    Diplopia    Echocardiogram    Echo 10/19: mod LVH, EF 55-60, no RWMA, Gr 1 DD, trivial AI, MAC, mod LAE, prob small post effusion   Essential hypertension 05/17/2016   Hyperlipidemia with target low density lipoprotein (LDL) cholesterol less than 70 mg/dL 06/14/2008   Qualifier: Diagnosis of  By: Mare Ferrari, RMA, [name]     Hypertension    Hypertensive heart disease 06/14/2008   Qualifier: Diagnosis of  By: Mare Ferrari, RMA, [name]     Hypertensive urgency, malignant 04/16/2012   Hypokalemia 04/14/2017   Internal hemorrhoids 04/19/2012   Ischemic stroke ([place]) 05/02/2016   Non-ST elevation (NSTEMI) myocardial infarction [facility])    pandiverticulosis 04/22/2012   12/17/2011. [place]. Juanita Craver MD. Colonoscopy. Moderate sized internal hemorrhoids and extensive pandiverticulosis. Repeat 5 years.     Pericardial effusion    Echo 11/19: mild focal basal septal hypertrophy, EF 55-60, Gr 2 DD, trivial AI, trivial TR, trivial PI, small to mod eff post to heart - no evidence of RV collapse.>> repeat limited echo in 03/2018 // Echo 03/2018: EF 55-60, mild LVH, +diastolic dysfunction, no RWMA, PASP 23, trivial pericardial effusion    Polycythemia vera(238.4) 04/17/2012   S/P CABG x 4 04/17/2017   Syncope and collapse 04/17/2012   Pt syncopized while sitting in bed giving history @ time of admission to hospital Tachycardic (appeared sinus) to 120s-130s and hypotensive to 50s/30s.  Unresponsive initially >> Spontaneously resolved after 2-3 minutes >> return to baseline ~30 minutes    Vertigo     Past Surgical History:  Procedure Laterality Date   CORONARY ARTERY BYPASS GRAFT N/A 04/17/2017   Procedure: CORONARY ARTERY BYPASS GRAFTING (CABG) x4 , using left internal mammary artery  to LAD and right leg greater saphenous vein harvested endoscopically  to PDA, Diagonal I and Circumflex;  Surgeon: [name], MD;  Location: [facility];  Service: Open Heart Surgery;  Laterality: N/A;   CORONARY ARTERY BYPASS  GRAFT  2020   CORONARY STENT PLACEMENT     LEFT HEART CATH AND CORONARY ANGIOGRAPHY N/A 04/16/2017   Procedure: LEFT HEART CATH AND CORONARY ANGIOGRAPHY;  Surgeon: Sherren Mocha, MD;  Location: [facility];  Service: Cardiovascular;  Laterality: N/A;   TEE WITHOUT CARDIOVERSION N/A 04/17/2017   Procedure: TRANSESOPHAGEAL ECHOCARDIOGRAM (TEE);  Surgeon: [name], MD;  Location: [facility];  Service: Open Heart Surgery;  Laterality: N/A;    Social History   Socioeconomic History   Marital status: Married    Spouse name: Not on file   Number of children: Not on file   Years of education: Not on file   Highest education level: Not on file  Occupational History   Not on file  Tobacco Use   Smoking status: Never   Smokeless tobacco: Never  Vaping Use   Vaping Use: Never used  Substance and Sexual Activity   Alcohol use: No    Alcohol/week: 0.0 standard  drinks   Drug use: No   Sexual activity: Not on file  Other Topics Concern   Not on file  Social History Narrative   Lives in [place] with Wife and 2 sons.  From [place].  To [place] ~2000.     Currently retired but worked in [place] Strain: Low Risk    Difficulty of Paying Living Expenses: Not hard at all  Food Insecurity: No Food Insecurity   Worried About Charity fundraiser in the Last Year: Never true   Arboriculturist in the Last Year: Never true  Transportation Needs: No Transportation Needs   Lack of Transportation (Medical): No   Lack of Transportation (Non-Medical): No  Physical Activity: Inactive   Days of Exercise per Week: 0 days   Minutes of Exercise per Session: 0 min  Stress: No Stress Concern Present   Feeling of Stress : Not at all  Social Connections: Socially Integrated   Frequency of Communication with Friends and Family: More than three times a week   Frequency of Social Gatherings with Friends and Family: Three times a week    Attends Religious Services: More than 4 times per year   Active Member of Clubs or Organizations: No   Attends Music therapist: More than 4 times per year   Marital Status: Married  Human resources officer Violence: Not At Risk   Fear of Current or Ex-Partner: No   Emotionally Abused: No   Physically Abused: No   Sexually Abused: No    Family History  Problem Relation Age of Onset   Heart disease Mother    Cancer Sister      Review of Systems  Constitutional: Negative.  Negative for chills and fever.  HENT: Negative.  Negative for congestion and sore throat.   Respiratory: Negative.  Negative for cough and shortness of breath.   Cardiovascular:  Negative for chest pain and palpitations.  Gastrointestinal:  Negative for abdominal pain, diarrhea, nausea and vomiting.  Genitourinary: Negative.  Negative for dysuria and hematuria.  Skin: Negative.  Negative for rash.  Neurological: Negative.  Negative for dizziness and headaches.  All other systems reviewed and are negative.  Today's Vitals   09/20/20 1321  BP: 124/62  Pulse: 63  Temp: 98.4 F (36.9 C)  TempSrc: Oral  SpO2: 97%  Weight: 179 lb (81.2 kg)  Height: _0  (1.676 m)   Body mass index is 28.89 kg/m. Wt Readings from Last 3 Encounters:  09/20/20 179 lb (81.2 kg)  09/10/20 180 lb 12.4 oz (82 kg)  09/07/20 180 lb (81.6 kg)    Physical Exam Vitals reviewed.  Constitutional:      Appearance: Normal appearance.  HENT:     Head: Normocephalic.  Eyes:     Extraocular Movements: Extraocular movements intact.     Conjunctiva/sclera: Conjunctivae normal.     Pupils: Pupils are equal, round, and reactive to light.  Cardiovascular:     Rate and Rhythm: Normal rate and regular rhythm.     Pulses: Normal pulses.     Heart sounds: Normal heart sounds.  Pulmonary:     Effort: Pulmonary effort is normal.     Breath sounds: Normal breath sounds.  Musculoskeletal:     Cervical back: Normal range of  motion and neck supple.  Skin:    General: Skin is warm and dry.     Capillary Refill: Capillary refill takes less  than 2 seconds.  Neurological:     General: No focal deficit present.     Mental Status: He is alert and oriented to person, place, and time.     Sensory: No sensory deficit.     Motor: No weakness.     Comments: Gait better than before.  Not using walker but still has some balance issues.  Psychiatric:        Mood and Affect: Mood normal.        Behavior: Behavior normal.     ASSESSMENT & PLAN: A total of 35 minutes was spent with the patient and counseling/coordination of care regarding preparing for this visit, review of most recent office visit notes, review of most recent cardiologist office visit note, review of most recent echocardiogram and cardiac MRI reports, review of most recent emergency department visit, review of most recent blood work results which were normal, review of all medications, education on nutrition, prognosis, documentation and need for follow-up.  Essential hypertension Well-controlled hypertension. Continue amlodipine 5 mg daily and valsartan 320 mg daily. Also continue carvedilol 12.5 mg twice a day.  Hypertensive heart disease Clinically euvolemic.  Continue blood pressure medications Continue Lasix 40 mg daily.  Pericardial effusion Cardiac MRI report reviewed with patient.  Scheduled for PET scan next week.  Aortic atherosclerosis (HCC) Continue rosuvastatin 40 mg daily.  Coronary artery disease of native heart with stable angina pectoris, unspecified vessel or lesion type ([place]) No recent chest pain episodes.  Recent cardiac evaluation stable. Recent echocardiogram report reviewed with patient.  Normal LV function with good ejection fraction.  Gait instability Better than before but still showing some balance problems.  Needs evaluation by neurology.   Kelii was seen today for hospitalization follow-up.  Diagnoses and all  orders for this visit:  Essential hypertension -     amLODipine (NORVASC) 5 MG tablet; Take 1 tablet (5 mg total) by mouth daily.  Gait instability -     Ambulatory referral to Neurology  Balance problem -     Ambulatory referral to Neurology  Hypertensive heart disease with chronic diastolic congestive heart failure (HCC)  Pericardial effusion  Aortic atherosclerosis (HCC)  Coronary artery disease of native heart with stable angina pectoris, unspecified vessel or lesion type ([place])  Other orders -     pantoprazole (PROTONIX) 40 MG tablet; Take 1 tablet (40 mg total) by mouth daily.  Patient Instructions  Hipertensin en los adultos Hypertension, Adult El trmino hipertensin es otra forma de denominar a la presin arterial elevada. La presin arterial elevada fuerza al corazn a trabajar ms parabombear la sangre. Esto puede causar problemas con el paso del [place]. Una lectura de presin arterial est compuesta por 2 nmeros. Hay un nmero superior (sistlico) sobre un nmero inferior (diastlico). Lo ideal es tener la presin arterial por debajo de 120/80. Las elecciones saludables pueden ayudar a Engineer, materials presin arterial, o tal vez necesitemedicamentos para bajarla. Cules son las causas? Se desconoce la causa de esta afeccin. Algunas afecciones pueden estarrelacionadas con la presin arterial alta. Qu incrementa el riesgo? Fumar. Tener diabetes mellitus tipo 2, colesterol alto, o ambos. No hacer la cantidad suficiente de actividad fsica o ejercicio. Tener sobrepeso. Consumir mucha grasa, azcar, caloras o sal (sodio) en su dieta. Beber alcohol en exceso. Tener una enfermedad renal a largo plazo (crnica). Tener antecedentes familiares de presin arterial alta. Edad. Los riesgos aumentan con la edad. [place]aza. El riesgo es mayor para las Retail banker. Sexo. Antes de los 45  aos, los hombres corren ms [employer]. Despus de los 65 aos, las mujeres  corren ms [employer]. Tener apnea obstructiva del sueo. Estrs. Cules son los signos o los sntomas? Es posible que la presin arterial alta puede no cause sntomas. La presin arterial muy alta (crisis hipertensiva) puede provocar: Dolor de cabeza. Sensaciones de preocupacin o nerviosismo (ansiedad). Falta de aire. Hemorragia nasal. Sensacin de malestar en el estmago (nuseas). Vmitos. Cambios en la forma de ver. Dolor muy intenso en el pecho. Convulsiones. Cmo se trata? Esta afeccin se trata haciendo cambios saludables en el estilo de vida, por ejemplo: Consumir alimentos saludables. Hacer ms ejercicio. Beber menos alcohol. El mdico puede recetarle medicamentos si los cambios en el estilo de vida no son suficientes para Child psychotherapist la presin arterial y si: El nmero de arriba est por encima de 130. El nmero de abajo est por encima de 80. Su presin arterial personal ideal puede variar. Siga estas instrucciones en su casa: Comida y bebida  Si se lo dicen, siga el plan de alimentacin de DASH (Dietary Approaches to Stop Hypertension, Maneras de alimentarse para detener la hipertensin). Para seguir este plan: Llene la mitad del plato de cada comida con frutas y verduras. Llene un cuarto del plato de cada comida con cereales integrales. Los cereales integrales incluyen pasta integral, arroz integral y pan integral. Coma y beba productos lcteos con bajo contenido de grasa, como leche descremada o yogur bajo en grasas. Llene un cuarto del plato de cada comida con protenas bajas en grasa (magras). Las protenas bajas en grasa incluyen pescado, pollo sin piel, huevos, frijoles y tofu. Evite consumir carne grasa, carne curada y procesada, o pollo con piel. Evite consumir alimentos prehechos o procesados. Consuma menos de 1500 mg de sal por da. No beba alcohol si: El mdico le indica que no lo haga. Est embarazada, puede estar embarazada o est  tratando de [place]. Si bebe alcohol: Limite la cantidad que bebe a lo siguiente: De 0 a 1 medida por da para las mujeres. De 0 a 2 medidas por da para los hombres. Est atento a la cantidad de alcohol que hay en las bebidas que toma. En los [place], una medida equivale a una botella de cerveza de 12 oz (355 ml), un vaso de vino de 5 oz (148 ml) o un vaso de una bebida alcohlica de alta graduacin de 1 oz (44 ml).  Estilo de vida  Trabaje con su mdico para mantenerse en un peso saludable o para perder peso. Pregntele a su mdico cul es el peso recomendable para usted. Haga al menos 30 minutos de ejercicio la [employer] de la [place]. Estos pueden incluir caminar, nadar o andar en bicicleta. Realice al menos 30 minutos de ejercicio que fortalezca sus msculos (ejercicios de resistencia) al menos 3 das a la [place]. Estos pueden incluir levantar pesas o hacer [place]. No consuma ningn producto que contenga nicotina o tabaco, como cigarrillos, cigarrillos electrnicos y tabaco de Higher education careers adviser. Si necesita ayuda para dejar de fumar, consulte al [employer]. Controle su presin arterial en su casa tal como le indic el mdico. Concurra a todas las visitas de seguimiento como se lo haya indicado el mdico. Esto es importante.  Medicamentos [employer] de venta libre y los recetados solamente como se lo haya indicado el mdico. Siga cuidadosamente las indicaciones. No omita las dosis de medicamentos para la presin arterial. Los medicamentos pierden eficacia si omite dosis.  El hecho de omitir las dosis tambin [place] el riesgo de otros problemas. Pregntele a su mdico a qu efectos secundarios o reacciones a los Careers information officer. Comunquese con un mdico si: Piensa que tiene [place] reaccin a los medicamentos que est tomando. Tiene dolores de cabeza frecuentes (recurrentes). Se siente mareado. Tiene hinchazn en los tobillos. Tiene problemas de  visin. Solicite ayuda inmediatamente si: Siente un dolor de cabeza muy intenso. Empieza a sentirse desorientado (confundido). Se siente dbil o adormecido. Siente que va a desmayarse. Tiene un dolor muy intenso en las siguientes zonas: Pecho. Vientre (abdomen). Vomita ms de una vez. Tiene dificultad para respirar. Resumen El trmino hipertensin es otra forma de denominar a la presin arterial elevada. La presin arterial elevada fuerza al corazn a trabajar ms para bombear la sangre. Para la [employer], una presin arterial normal es menor que 120/80. Las decisiones saludables pueden ayudarle a disminuir su presin arterial. Si no puede bajar su presin arterial mediante decisiones saludables, es posible que deba tomar medicamentos. Esta informacin no tiene Marine scientist el consejo del mdico. Asegresede hacerle al mdico cualquier pregunta que tenga. Document Revised: 11/21/2017 Document Reviewed: 11/21/2017 Elsevier Patient Education  2022 [place], MD [facility]

## 2020-09-20 NOTE — Assessment & Plan Note (Signed)
Well-controlled hypertension. Continue amlodipine 5 mg daily and valsartan 320 mg daily. Also continue carvedilol 12.5 mg twice a day.

## 2020-09-23 NOTE — Telephone Encounter (Signed)
Spoke with  pt and pt's wife and pt still not feeling well  per wife has napped a couple of times today and just laying  around  B/p has improved since med changes from ED visit Per wife pt does not want to see APP only  Dr Burt Knack .Adonis Housekeeper

## 2020-09-25 NOTE — Telephone Encounter (Signed)
Ok to add him at 12:00 or 4:20 on 8/8 DOD schedule if he is able to come

## 2020-09-26 ENCOUNTER — Telehealth: Payer: Self-pay | Admitting: Cardiovascular Disease

## 2020-09-26 DIAGNOSIS — I700 Atherosclerosis of aorta: Secondary | ICD-10-CM | POA: Diagnosis not present

## 2020-09-26 DIAGNOSIS — G8929 Other chronic pain: Secondary | ICD-10-CM | POA: Diagnosis not present

## 2020-09-26 DIAGNOSIS — M479 Spondylosis, unspecified: Secondary | ICD-10-CM | POA: Diagnosis not present

## 2020-09-26 DIAGNOSIS — I083 Combined rheumatic disorders of mitral, aortic and tricuspid valves: Secondary | ICD-10-CM | POA: Diagnosis not present

## 2020-09-26 DIAGNOSIS — M545 Low back pain, unspecified: Secondary | ICD-10-CM | POA: Diagnosis not present

## 2020-09-26 DIAGNOSIS — M170 Bilateral primary osteoarthritis of knee: Secondary | ICD-10-CM | POA: Diagnosis not present

## 2020-09-26 DIAGNOSIS — I25119 Atherosclerotic heart disease of native coronary artery with unspecified angina pectoris: Secondary | ICD-10-CM | POA: Diagnosis not present

## 2020-09-26 DIAGNOSIS — I5032 Chronic diastolic (congestive) heart failure: Secondary | ICD-10-CM | POA: Diagnosis not present

## 2020-09-26 DIAGNOSIS — I110 Hypertensive heart disease with heart failure: Secondary | ICD-10-CM | POA: Diagnosis not present

## 2020-09-26 NOTE — Telephone Encounter (Signed)
Calling to see why appt was cancel for 8/12. Please advise  BP Yesterday none 103/54 Today prior excretion  118/78 After excretion 158/82  A symptomatic through yesterday From Clair Gulling PT a [facility] home [facility]. [phone]

## 2020-09-26 NOTE — Telephone Encounter (Signed)
Attempted to call patient, no answer and no voice mail. Left message for [name] with [facility] and advised that he may call back directly to my phone extension today. Advised that Dr. Burt Knack has agreed to see patient today 8/8 if patient is available.

## 2020-09-27 DIAGNOSIS — I25119 Atherosclerotic heart disease of native coronary artery with unspecified angina pectoris: Secondary | ICD-10-CM | POA: Diagnosis not present

## 2020-09-27 DIAGNOSIS — I110 Hypertensive heart disease with heart failure: Secondary | ICD-10-CM | POA: Diagnosis not present

## 2020-09-27 DIAGNOSIS — M170 Bilateral primary osteoarthritis of knee: Secondary | ICD-10-CM | POA: Diagnosis not present

## 2020-09-27 DIAGNOSIS — I700 Atherosclerosis of aorta: Secondary | ICD-10-CM | POA: Diagnosis not present

## 2020-09-27 DIAGNOSIS — I5032 Chronic diastolic (congestive) heart failure: Secondary | ICD-10-CM | POA: Diagnosis not present

## 2020-09-27 DIAGNOSIS — M545 Low back pain, unspecified: Secondary | ICD-10-CM | POA: Diagnosis not present

## 2020-09-27 DIAGNOSIS — G8929 Other chronic pain: Secondary | ICD-10-CM | POA: Diagnosis not present

## 2020-09-27 DIAGNOSIS — M479 Spondylosis, unspecified: Secondary | ICD-10-CM | POA: Diagnosis not present

## 2020-09-27 DIAGNOSIS — I083 Combined rheumatic disorders of mitral, aortic and tricuspid valves: Secondary | ICD-10-CM | POA: Diagnosis not present

## 2020-09-29 ENCOUNTER — Encounter (HOSPITAL_COMMUNITY)
Admission: RE | Admit: 2020-09-29 | Discharge: 2020-09-29 | Disposition: A | Discharge status: Home / Self Care | Payer: Medicare HMO | Source: Ambulatory Visit | Attending: Cardiovascular Disease | Admitting: Cardiovascular Disease

## 2020-09-29 ENCOUNTER — Other Ambulatory Visit: Payer: Self-pay

## 2020-09-29 DIAGNOSIS — J90 Pleural effusion, not elsewhere classified: Secondary | ICD-10-CM | POA: Diagnosis not present

## 2020-09-29 DIAGNOSIS — Z951 Presence of aortocoronary bypass graft: Secondary | ICD-10-CM | POA: Diagnosis not present

## 2020-09-29 DIAGNOSIS — I318 Other specified diseases of pericardium: Secondary | ICD-10-CM | POA: Insufficient documentation

## 2020-09-29 LAB — GLUCOSE, CAPILLARY: Glucose-Capillary: 127 mg/dL — ABNORMAL HIGH (ref 70–99)

## 2020-09-29 MED ORDER — FLUDEOXYGLUCOSE F - 18 (FDG) INJECTION
9.3000 | Freq: Once | INTRAVENOUS | Status: AC
  Administered 2020-09-29: 9.3 via INTRAVENOUS

## 2020-09-29 NOTE — Telephone Encounter (Signed)
See note from 8/8

## 2020-09-29 NOTE — Telephone Encounter (Signed)
I am coming on board to cover pre-op today. I do not think Dr. Burt Knack meant to route this to pre-op team, appears to have been more of a triage issue. Will route to triage to assist and remove from preop box.

## 2020-09-30 ENCOUNTER — Other Ambulatory Visit (HOSPITAL_COMMUNITY): Payer: Medicare HMO

## 2020-10-04 DIAGNOSIS — I25119 Atherosclerotic heart disease of native coronary artery with unspecified angina pectoris: Secondary | ICD-10-CM | POA: Diagnosis not present

## 2020-10-04 DIAGNOSIS — I700 Atherosclerosis of aorta: Secondary | ICD-10-CM | POA: Diagnosis not present

## 2020-10-04 DIAGNOSIS — I110 Hypertensive heart disease with heart failure: Secondary | ICD-10-CM | POA: Diagnosis not present

## 2020-10-04 DIAGNOSIS — I5032 Chronic diastolic (congestive) heart failure: Secondary | ICD-10-CM | POA: Diagnosis not present

## 2020-10-04 DIAGNOSIS — M170 Bilateral primary osteoarthritis of knee: Secondary | ICD-10-CM | POA: Diagnosis not present

## 2020-10-04 DIAGNOSIS — I083 Combined rheumatic disorders of mitral, aortic and tricuspid valves: Secondary | ICD-10-CM | POA: Diagnosis not present

## 2020-10-04 DIAGNOSIS — M479 Spondylosis, unspecified: Secondary | ICD-10-CM | POA: Diagnosis not present

## 2020-10-04 DIAGNOSIS — M545 Low back pain, unspecified: Secondary | ICD-10-CM | POA: Diagnosis not present

## 2020-10-04 DIAGNOSIS — G8929 Other chronic pain: Secondary | ICD-10-CM | POA: Diagnosis not present

## 2020-10-06 ENCOUNTER — Telehealth: Payer: Self-pay

## 2020-10-06 NOTE — Telephone Encounter (Signed)
Attempted to call Pt.  NO answer and no VM.  Call placed to son.  Advised of appt.  Per son-if parents do not answer-please call him with any information.

## 2020-10-07 DIAGNOSIS — I25119 Atherosclerotic heart disease of native coronary artery with unspecified angina pectoris: Secondary | ICD-10-CM | POA: Diagnosis not present

## 2020-10-07 DIAGNOSIS — I110 Hypertensive heart disease with heart failure: Secondary | ICD-10-CM | POA: Diagnosis not present

## 2020-10-07 DIAGNOSIS — I700 Atherosclerosis of aorta: Secondary | ICD-10-CM | POA: Diagnosis not present

## 2020-10-07 DIAGNOSIS — I083 Combined rheumatic disorders of mitral, aortic and tricuspid valves: Secondary | ICD-10-CM | POA: Diagnosis not present

## 2020-10-07 DIAGNOSIS — M170 Bilateral primary osteoarthritis of knee: Secondary | ICD-10-CM | POA: Diagnosis not present

## 2020-10-07 DIAGNOSIS — M479 Spondylosis, unspecified: Secondary | ICD-10-CM | POA: Diagnosis not present

## 2020-10-07 DIAGNOSIS — G8929 Other chronic pain: Secondary | ICD-10-CM | POA: Diagnosis not present

## 2020-10-07 DIAGNOSIS — M545 Low back pain, unspecified: Secondary | ICD-10-CM | POA: Diagnosis not present

## 2020-10-07 DIAGNOSIS — I5032 Chronic diastolic (congestive) heart failure: Secondary | ICD-10-CM | POA: Diagnosis not present

## 2020-10-10 ENCOUNTER — Telehealth: Payer: Self-pay | Admitting: Cardiovascular Disease

## 2020-10-10 DIAGNOSIS — M170 Bilateral primary osteoarthritis of knee: Secondary | ICD-10-CM | POA: Diagnosis not present

## 2020-10-10 DIAGNOSIS — G8929 Other chronic pain: Secondary | ICD-10-CM | POA: Diagnosis not present

## 2020-10-10 DIAGNOSIS — I083 Combined rheumatic disorders of mitral, aortic and tricuspid valves: Secondary | ICD-10-CM | POA: Diagnosis not present

## 2020-10-10 DIAGNOSIS — I110 Hypertensive heart disease with heart failure: Secondary | ICD-10-CM | POA: Diagnosis not present

## 2020-10-10 DIAGNOSIS — I25119 Atherosclerotic heart disease of native coronary artery with unspecified angina pectoris: Secondary | ICD-10-CM | POA: Diagnosis not present

## 2020-10-10 DIAGNOSIS — M545 Low back pain, unspecified: Secondary | ICD-10-CM | POA: Diagnosis not present

## 2020-10-10 DIAGNOSIS — M479 Spondylosis, unspecified: Secondary | ICD-10-CM | POA: Diagnosis not present

## 2020-10-10 DIAGNOSIS — I700 Atherosclerosis of aorta: Secondary | ICD-10-CM | POA: Diagnosis not present

## 2020-10-10 DIAGNOSIS — I5032 Chronic diastolic (congestive) heart failure: Secondary | ICD-10-CM | POA: Diagnosis not present

## 2020-10-10 NOTE — Telephone Encounter (Signed)
   Pt c/o BP issue: STAT if pt c/o blurred vision, one-sided weakness or slurred speech  1. What are your last 5 BP readings? 170/100  2. Are you having any other symptoms (ex. Dizziness, headache, blurred vision, passed out)? No symptoms   3. What is your BP issue?  [name] with [facility] called, he said, today is the last day of pt's home health visit. Pts. No longer homebound, pt took his BP meds as he arrives and took pt's BP beginning and the end of session, and pt's BPO remained 170/100 with no symptoms

## 2020-10-10 NOTE — Telephone Encounter (Signed)
Spoke with [name] who is calling to report the patient's elevated blood pressure. He states that the patient's BP was 170/100 when he arrived and when he left. Patient took his BP meds upon his arrival. BP was rechecked about 40 minutes later.  He states that overall the patient has been doing well. He was asymptomatic this morning. [name] states that he patient's BP has been controlled but does become elevated when he is stresses. His son is having surgery today and he was going over to the hospital to see him. His wife also has dementia which causes him stress.  Patient has a FU appointment with Dr. Burt Knack 8/31.

## 2020-10-11 DIAGNOSIS — I110 Hypertensive heart disease with heart failure: Secondary | ICD-10-CM | POA: Diagnosis not present

## 2020-10-11 DIAGNOSIS — G8929 Other chronic pain: Secondary | ICD-10-CM | POA: Diagnosis not present

## 2020-10-11 DIAGNOSIS — I5032 Chronic diastolic (congestive) heart failure: Secondary | ICD-10-CM | POA: Diagnosis not present

## 2020-10-11 DIAGNOSIS — M170 Bilateral primary osteoarthritis of knee: Secondary | ICD-10-CM | POA: Diagnosis not present

## 2020-10-11 DIAGNOSIS — M545 Low back pain, unspecified: Secondary | ICD-10-CM | POA: Diagnosis not present

## 2020-10-11 DIAGNOSIS — I25119 Atherosclerotic heart disease of native coronary artery with unspecified angina pectoris: Secondary | ICD-10-CM | POA: Diagnosis not present

## 2020-10-11 DIAGNOSIS — M479 Spondylosis, unspecified: Secondary | ICD-10-CM | POA: Diagnosis not present

## 2020-10-11 DIAGNOSIS — I083 Combined rheumatic disorders of mitral, aortic and tricuspid valves: Secondary | ICD-10-CM | POA: Diagnosis not present

## 2020-10-11 DIAGNOSIS — I700 Atherosclerosis of aorta: Secondary | ICD-10-CM | POA: Diagnosis not present

## 2020-10-19 ENCOUNTER — Other Ambulatory Visit: Payer: Self-pay

## 2020-10-19 ENCOUNTER — Ambulatory Visit: Payer: Medicare HMO | Admitting: Cardiovascular Disease

## 2020-10-19 ENCOUNTER — Encounter: Payer: Self-pay | Admitting: Cardiovascular Disease

## 2020-10-19 VITALS — BP 104/60 | HR 64 | Ht 65.0 in | Wt 173.4 lb

## 2020-10-19 DIAGNOSIS — I318 Other specified diseases of pericardium: Secondary | ICD-10-CM | POA: Diagnosis not present

## 2020-10-19 DIAGNOSIS — E782 Mixed hyperlipidemia: Secondary | ICD-10-CM

## 2020-10-19 DIAGNOSIS — I5032 Chronic diastolic (congestive) heart failure: Secondary | ICD-10-CM

## 2020-10-19 DIAGNOSIS — I2510 Atherosclerotic heart disease of native coronary artery without angina pectoris: Secondary | ICD-10-CM

## 2020-10-19 DIAGNOSIS — I10 Essential (primary) hypertension: Secondary | ICD-10-CM | POA: Diagnosis not present

## 2020-10-19 NOTE — Progress Notes (Signed)
Cardiology Office Note:    Date:  10/20/2020   ID:  [name], DOB 11-13-1940, MRN [id number]  PCP:  Gerald Pollen, MD   [facility] Cardiologist:  Gerald Mocha, MD     Referring MD: [name], *   Chief Complaint  Patient presents with   Coronary Artery Disease    History of Present Illness:    [name] is a 80 y.o. male with a hx of CABG in 2019.  He is being seen after hospitalization for atypical chest pain when a CAT scan incidentally demonstrated thickening of the posterior aspect of the pericardium with suspicion for a loculated effusion.  This was not seen on a surface echocardiogram.  The CT scan described the area as an 8.1 x 2.2 cm area of thickening and/or abnormal soft tissue along the lateral wall of the left ventricle, increased from prior exam in 2019.  A cardiac MRI is recommended and it demonstrated a 17 mm heterogenous effusion over the lateral wall of the left ventricle described as a complex pericardial confluence.  A PET scan demonstrated an intensely hypermetabolic soft tissue thickening along the lateral wall of the left ventricle within the pericardial space, considerations included benign versus malignant etiologies including an inflammatory process.  There was no evidence of metastatic disease or extracardiac findings.  The patient is here with his wife today.  He is actually doing quite well.  He denies any chest pain, shortness of breath, heart palpitations, fevers, chills, or malaise.  His appetite is normal.  He is compliant with his medications and has no specific complaints today.  Past Medical History:  Diagnosis Date   Blurred vision 05/17/2016   CAD (coronary artery disease), native coronary artery 06/14/2008   a. BMS to LCx & OM 2008 b. 03/2017 CABG x 4 (LIMA to LAD, SVG to diag 1, SVG to distal Circ, SVG to PDA)   Chest pain 04/16/2012   Diplopia    Echocardiogram    Echo 10/19: mod LVH, EF 55-60, no  RWMA, Gr 1 DD, trivial AI, MAC, mod LAE, prob small post effusion   Essential hypertension 05/17/2016   Hyperlipidemia with target low density lipoprotein (LDL) cholesterol less than 70 mg/dL 06/14/2008   Qualifier: Diagnosis of  By: Mare Ferrari, RMA, [name]     Hypertension    Hypertensive heart disease 06/14/2008   Qualifier: Diagnosis of  By: Mare Ferrari, RMA, [name]     Hypertensive urgency, malignant 04/16/2012   Hypokalemia 04/14/2017   Internal hemorrhoids 04/19/2012   Ischemic stroke ([place]) 05/02/2016   Non-ST elevation (NSTEMI) myocardial infarction [facility])    pandiverticulosis 04/22/2012   12/17/2011. [place]. Juanita Craver MD. Colonoscopy. Moderate sized internal hemorrhoids and extensive pandiverticulosis. Repeat 5 years.     Pericardial effusion    Echo 11/19: mild focal basal septal hypertrophy, EF 55-60, Gr 2 DD, trivial AI, trivial TR, trivial PI, small to mod eff post to heart - no evidence of RV collapse.>> repeat limited echo in 03/2018 // Echo 03/2018: EF 55-60, mild LVH, +diastolic dysfunction, no RWMA, PASP 23, trivial pericardial effusion    Polycythemia vera(238.4) 04/17/2012   S/P CABG x 4 04/17/2017   Syncope and collapse 04/17/2012   Pt syncopized while sitting in bed giving history @ time of admission to hospital Tachycardic (appeared sinus) to 120s-130s and hypotensive to 50s/30s.  Unresponsive initially >> Spontaneously resolved after 2-3 minutes >> return to baseline ~30 minutes    Vertigo  Past Surgical History:  Procedure Laterality Date   CORONARY ARTERY BYPASS GRAFT N/A 04/17/2017   Procedure: CORONARY ARTERY BYPASS GRAFTING (CABG) x4 , using left internal mammary artery  to LAD and right leg greater saphenous vein harvested endoscopically  to PDA, Diagonal I and Circumflex;  Surgeon: [name], MD;  Location: [place];  Service: Open Heart Surgery;  Laterality: N/A;   CORONARY ARTERY BYPASS GRAFT  2020   CORONARY STENT PLACEMENT     LEFT HEART CATH AND  CORONARY ANGIOGRAPHY N/A 04/16/2017   Procedure: LEFT HEART CATH AND CORONARY ANGIOGRAPHY;  Surgeon: Gerald Mocha, MD;  Location: [facility];  Service: Cardiovascular;  Laterality: N/A;   TEE WITHOUT CARDIOVERSION N/A 04/17/2017   Procedure: TRANSESOPHAGEAL ECHOCARDIOGRAM (TEE);  Surgeon: [name], MD;  Location: [place];  Service: Open Heart Surgery;  Laterality: N/A;    Current Medications: Current Meds  Medication Sig   acetaminophen (TYLENOL) 325 MG tablet Take 325 mg by mouth daily.   amLODipine (NORVASC) 5 MG tablet Take 1 tablet (5 mg total) by mouth daily.   aspirin EC 81 MG tablet Take 81 mg by mouth daily.   carvedilol (COREG) 12.5 MG tablet Take 1 tablet (12.5 mg total) by mouth 2 (two) times daily.   clopidogrel (PLAVIX) 75 MG tablet Take 1 tablet (75 mg total) by mouth daily.   furosemide (LASIX) 40 MG tablet Take 1 tablet (40 mg total) by mouth every other day.   pantoprazole (PROTONIX) 40 MG tablet Take 1 tablet (40 mg total) by mouth daily.   potassium chloride SA (KLOR-CON) 20 MEQ tablet Take 1 tablet (20 mEq total) by mouth every other day.   rosuvastatin (CRESTOR) 40 MG tablet Take 1 tablet (40 mg total) by mouth daily.   valsartan (DIOVAN) 320 MG tablet Take 1 tablet (320 mg total) by mouth daily.     Allergies:   Lipitor [atorvastatin], Morphine and related, and Pork-derived products   Social History   Socioeconomic History   Marital status: Married    Spouse name: Not on file   Number of children: Not on file   Years of education: Not on file   Highest education level: Not on file  Occupational History   Not on file  Tobacco Use   Smoking status: Never   Smokeless tobacco: Never  Vaping Use   Vaping Use: Never used  Substance and Sexual Activity   Alcohol use: No    Alcohol/week: 0.0 standard drinks   Drug use: No   Sexual activity: Not on file  Other Topics Concern   Not on file  Social History Narrative   Lives in [place] with Wife  and 2 sons.  From [place].  To [place] ~2000.     Currently retired but worked in [place] Strain: Low Risk    Difficulty of Paying Living Expenses: Not hard at all  Food Insecurity: No Food Insecurity   Worried About Charity fundraiser in the Last Year: Never true   Arboriculturist in the Last Year: Never true  Transportation Needs: No Transportation Needs   Lack of Transportation (Medical): No   Lack of Transportation (Non-Medical): No  Physical Activity: Inactive   Days of Exercise per Week: 0 days   Minutes of Exercise per Session: 0 min  Stress: No Stress Concern Present   Feeling of Stress : Not at all  Social  Connections: Socially Integrated   Frequency of Communication with Friends and Family: Foster than three times a week   Frequency of Social Gatherings with Friends and Family: Three times a week   Attends Religious Services: Foster than 4 times per year   Active Member of Clubs or Organizations: No   Attends Music therapist: More than 4 times per year   Marital Status: Married     Family History: The patient's family history includes Cancer in his sister; Heart disease in his mother.  ROS:   Please see the history of present illness.    All other systems reviewed and are negative.  EKGs/Labs/Other Studies Reviewed:    The following studies were reviewed today: Echocardiogram 08/09/2020: IMPRESSIONS     1. Left ventricular ejection fraction, by estimation, is 60 to 65%. The  left ventricle has normal function. The left ventricle has no regional  wall motion abnormalities. There is mild left ventricular hypertrophy.  Left ventricular diastolic parameters  are consistent with Grade II diastolic dysfunction (pseudonormalization).  Elevated left atrial pressure.   2. Right ventricular systolic function is normal. The right ventricular  size is normal.   3. Left atrial size was  moderately dilated.   4. The mitral valve is normal in structure. Trivial mitral valve  regurgitation. No evidence of mitral stenosis.   5. The aortic valve is tricuspid. Aortic valve regurgitation is trivial.  Mild aortic valve sclerosis is present, with no evidence of aortic valve  stenosis.   6. The inferior vena cava is normal in size with greater than 50%  respiratory variability, suggesting right atrial pressure of 3 mmHg.   Cardiac MRI 08/30/2020: FINDINGS: 1. Normal left ventricular size, with LVEDD 41 mm, and LVEDVi 61 mL/m2.   Normal left ventricular thickness, with intraventricular septal thickness of 9 mm, posterior wall thickness of 10 mm.   Mild left ventricular systolic dysfunction (LVEF =49%). There are no regional wall motion abnormalities; mild global hypokinesis.   2. Normal right ventricular size with RVEDVI 46 mL/m2.   Normal right ventricular thickness.   Normal right ventricular systolic function (RVEF =28%). There are no regional wall motion abnormalities or aneurysms.   3.  Normal right atrial size and moderate left atrial dilation.   4. Normal size of the aortic root, ascending aorta and pulmonary artery.   5.  No significant valvular abnormalities.   6. There is a moderate (max dimension 17 mm) heterogeneous effusion overlying the lateral wall of the left ventricle (most prominent over the anterolateral base. There is intermediate but largely iso-intense native T1 signal intensity (1159 ms max average 1070 ms) with an intermediate to iso-intense T2 signal (65 ms at max, average 50 ms). There is evidence of contrast enhancement and pericardial thickening. No evidence of fat deposition based on dark blood assessment. These is an area of lack of enhancement best seen in the PSIR view.   7. There is evidence of sternotomy wires and both breath hold and wrap around artifact. Grossly, no other extracardiac findings. Recommended dedicated study if  concerned for non-cardiac pathology.   IMPRESSION: Complex pericardial confluence as described above. Reaching out to primary MD.  NM Pet Scan 09/29/2020: IMPRESSION: 1. Intensely hypermetabolic soft tissue thickening along the lateral wall of LEFT ventricle within the pericardial space. Differential includes benign and malignant etiologies including inflammatory process. Lesion new from pre CABG CT 2011. See cardiac MRI for description. 2. No evidence of metastatic disease.  EKG:  EKG is  not ordered today.  Recent Labs: 05/31/2020: TSH 2.26 09/10/2020: ALT 14; BUN 13; Creatinine, Ser 0.97; Hemoglobin 13.9; Platelets 206; Potassium 4.1; Sodium 136  Recent Lipid Panel    Component Value Date/Time   CHOL 111 05/31/2020 1345   CHOL 105 08/14/2019 0956   TRIG 74.0 05/31/2020 1345   HDL 40.30 05/31/2020 1345   HDL 37 (L) 08/14/2019 0956   CHOLHDL 3 05/31/2020 1345   VLDL 14.8 05/31/2020 1345   LDLCALC 56 05/31/2020 1345   LDLCALC 50 08/14/2019 0956   LDLDIRECT 150.8 06/30/2008 1007     Risk Assessment/Calculations:           Physical Exam:    VS:  BP 104/60   Pulse 64   Ht _0  (1.651 m)   Wt 173 lb 6.4 oz (78.7 kg)   SpO2 97%   BMI 28.86 kg/m     Wt Readings from Last 3 Encounters:  10/19/20 173 lb 6.4 oz (78.7 kg)  09/20/20 179 lb (81.2 kg)  09/10/20 180 lb 12.4 oz (82 kg)     GEN:  Well nourished, well developed in no acute distress HEENT: Normal NECK: No JVD; No carotid bruits LYMPHATICS: No lymphadenopathy CARDIAC: RRR, no murmurs, rubs, gallops RESPIRATORY:  Clear to auscultation without rales, wheezing or rhonchi  ABDOMEN: Soft, non-tender, non-distended MUSCULOSKELETAL:  No edema; No deformity  SKIN: Warm and dry NEUROLOGIC:  Alert and oriented x 3 PSYCHIATRIC:  Normal affect   ASSESSMENT:    1. Pericardial mass   2. Coronary artery disease involving native coronary artery of native heart without angina pectoris   3. Chronic diastolic heart  failure ([place])   4. Essential hypertension   5. Mixed hyperlipidemia    PLAN:    In order of problems listed above:  Reviewed imaging findings in detail with the patient and his wife.  I think the only option for definitive diagnosis would be a biopsy.  The patient really is not interested in pursuing any invasive evaluation.  I think it is reasonable considering his completely asymptomatic status that we follow this up with a 61-monthCT scan for follow-up.  Discussed the possibility of surgery and/or biopsy and again he declines this. Stable without symptoms of angina.  Continue aspirin and clopidogrel.  Continue high intensity statin drug. The patient looks very good today.  His volume status is good as he appears euvolemic.  LV function is normal on most recent echocardiogram.  Continue current management. Blood pressure is well controlled on a combination of amlodipine, carvedilol, and valsartan. Lipids have been at goal on rosuvastatin 40 mg daily.  Medication Adjustments/Labs and Tests Ordered: Current medicines are reviewed at length with the patient today.  Concerns regarding medicines are outlined above.  Orders Placed This Encounter  Procedures   CT CHEST WO CONTRAST    No orders of the defined types were placed in this encounter.   Patient Instructions  Medication Instructions:  Your physician recommends that you continue on your current medications as directed. Please refer to the Current Medication list given to you today.  *If you need a refill on your cardiac medications before your next appointment, please call your pharmacy*   Lab Work: None Ordered If you have labs (blood work) drawn today and your tests are completely normal, you will receive your results only by: [place](if you have MyChart) OR A paper copy in the mail If you have any lab test that is abnormal or we need to  change your treatment, we will call you to review the  results.   Testing/Procedures: Non-Cardiac CT scanning, (CAT scanning), is a noninvasive, special x-ray that produces cross-sectional images of the body using x-rays and a computer. CT scans help physicians diagnose and treat medical conditions. For some CT exams, a contrast material is used to enhance visibility in the area of the body being studied. CT scans provide greater clarity and reveal Foster details than regular x-ray exams. **schedule this test before March 30   Follow-Up: At [facility], you and your health needs are our priority.  As part of our continuing mission to provide you with exceptional heart care, we have created designated Provider Care Teams.  These Care Teams include your primary Cardiologist (physician) and Advanced Practice Providers (APPs -  Physician Assistants and Nurse Practitioners) who all work together to provide you with the care you need, when you need it.  We recommend signing up for the patient portal called "MyChart".  Sign up information is provided on this After Visit Summary.  MyChart is used to connect with patients for Virtual Visits (Telemedicine).  Patients are able to view lab/test results, encounter notes, upcoming appointments, etc.  Non-urgent messages can be sent to your provider as well.   To learn Foster about what you can do with MyChart, go to [email or web address].    Your next appointment:   6 month(s) on Thursday March 30 at 8:00 am  The format for your next appointment:   In Person  Provider:   Sherren Mocha, MD     Signed, Gerald Mocha, MD  10/20/2020 9:36 AM    [place]

## 2020-10-19 NOTE — Patient Instructions (Addendum)
Medication Instructions:  Your physician recommends that you continue on your current medications as directed. Please refer to the Current Medication list given to you today.  *If you need a refill on your cardiac medications before your next appointment, please call your pharmacy*   Lab Work: None Ordered If you have labs (blood work) drawn today and your tests are completely normal, you will receive your results only by: [place] (if you have MyChart) OR A paper copy in the mail If you have any lab test that is abnormal or we need to change your treatment, we will call you to review the results.   Testing/Procedures: Non-Cardiac CT scanning, (CAT scanning), is a noninvasive, special x-ray that produces cross-sectional images of the body using x-rays and a computer. CT scans help physicians diagnose and treat medical conditions. For some CT exams, a contrast material is used to enhance visibility in the area of the body being studied. CT scans provide greater clarity and reveal more details than regular x-ray exams. **schedule this test before March 30   Follow-Up: At [facility], you and your health needs are our priority.  As part of our continuing mission to provide you with exceptional heart care, we have created designated Provider Care Teams.  These Care Teams include your primary Cardiologist (physician) and Advanced Practice Providers (APPs -  Physician Assistants and Nurse Practitioners) who all work together to provide you with the care you need, when you need it.  We recommend signing up for the patient portal called "MyChart".  Sign up information is provided on this After Visit Summary.  MyChart is used to connect with patients for Virtual Visits (Telemedicine).  Patients are able to view lab/test results, encounter notes, upcoming appointments, etc.  Non-urgent messages can be sent to your provider as well.   To learn more about what you can do with MyChart, go to  [email or web address].    Your next appointment:   6 month(s) on Thursday March 30 at 8:00 am  The format for your next appointment:   In Person  Provider:   Sherren Mocha, MD

## 2020-11-08 ENCOUNTER — Ambulatory Visit
Admission: RE | Admit: 2020-11-08 | Discharge: 2020-11-08 | Disposition: A | Discharge status: Home / Self Care | Payer: Medicare HMO | Source: Ambulatory Visit | Attending: Cardiovascular Disease | Admitting: Cardiovascular Disease

## 2020-11-08 DIAGNOSIS — I318 Other specified diseases of pericardium: Secondary | ICD-10-CM

## 2020-11-08 DIAGNOSIS — J9811 Atelectasis: Secondary | ICD-10-CM | POA: Diagnosis not present

## 2020-11-08 DIAGNOSIS — I700 Atherosclerosis of aorta: Secondary | ICD-10-CM | POA: Diagnosis not present

## 2020-11-09 ENCOUNTER — Telehealth: Payer: Self-pay | Admitting: Cardiovascular Disease

## 2020-11-09 NOTE — Telephone Encounter (Signed)
Patient was calling to get CT results

## 2020-11-09 NOTE — Telephone Encounter (Signed)
Spoke with pt and pt called wanting to know when Dr Burt Knack here in office to bring him a gift Pt did not call for CT results . Pt aware Dr Burt Knack here Monday 11/14/20./cy

## 2020-11-30 ENCOUNTER — Ambulatory Visit (INDEPENDENT_AMBULATORY_CARE_PROVIDER_SITE_OTHER): Payer: Medicare HMO | Admitting: Emergency Medicine

## 2020-11-30 ENCOUNTER — Encounter: Payer: Self-pay | Admitting: Emergency Medicine

## 2020-11-30 VITALS — BP 116/60 | HR 63 | Temp 98.0°F | Ht 65.0 in | Wt 175.0 lb

## 2020-11-30 DIAGNOSIS — I5032 Chronic diastolic (congestive) heart failure: Secondary | ICD-10-CM

## 2020-11-30 DIAGNOSIS — I700 Atherosclerosis of aorta: Secondary | ICD-10-CM

## 2020-11-30 DIAGNOSIS — E785 Hyperlipidemia, unspecified: Secondary | ICD-10-CM

## 2020-11-30 DIAGNOSIS — Z8673 Personal history of transient ischemic attack (TIA), and cerebral infarction without residual deficits: Secondary | ICD-10-CM | POA: Diagnosis not present

## 2020-11-30 DIAGNOSIS — Z23 Encounter for immunization: Secondary | ICD-10-CM | POA: Diagnosis not present

## 2020-11-30 DIAGNOSIS — I10 Essential (primary) hypertension: Secondary | ICD-10-CM | POA: Diagnosis not present

## 2020-11-30 DIAGNOSIS — I110 Hypertensive heart disease with heart failure: Secondary | ICD-10-CM | POA: Diagnosis not present

## 2020-11-30 DIAGNOSIS — I25118 Atherosclerotic heart disease of native coronary artery with other forms of angina pectoris: Secondary | ICD-10-CM

## 2020-11-30 DIAGNOSIS — I639 Cerebral infarction, unspecified: Secondary | ICD-10-CM | POA: Diagnosis not present

## 2020-11-30 DIAGNOSIS — E1169 Type 2 diabetes mellitus with other specified complication: Secondary | ICD-10-CM

## 2020-11-30 MED ORDER — VALSARTAN 320 MG PO TABS: 320.0000 mg | ORAL_TABLET | Freq: Every day | ORAL | 3 refills | Status: DC

## 2020-11-30 MED ORDER — ROSUVASTATIN CALCIUM 40 MG PO TABS: 40.0000 mg | ORAL_TABLET | Freq: Every day | ORAL | 3 refills | Status: DC

## 2020-11-30 MED ORDER — AMLODIPINE BESYLATE 5 MG PO TABS: 5.0000 mg | ORAL_TABLET | Freq: Every day | ORAL | 3 refills | Status: DC

## 2020-11-30 MED ORDER — CLOPIDOGREL BISULFATE 75 MG PO TABS: 75.0000 mg | ORAL_TABLET | Freq: Every day | ORAL | 3 refills | Status: AC

## 2020-11-30 MED ORDER — FUROSEMIDE 40 MG PO TABS: 40.0000 mg | ORAL_TABLET | ORAL | 3 refills | Status: DC

## 2020-11-30 MED ORDER — CARVEDILOL 12.5 MG PO TABS: 12.5000 mg | ORAL_TABLET | Freq: Two times a day (BID) | ORAL | 3 refills | Status: DC

## 2020-11-30 NOTE — Assessment & Plan Note (Signed)
Diet and nutrition discussed.  Continue rosuvastatin 40 mg daily.

## 2020-11-30 NOTE — Patient Instructions (Signed)
Hipertensi?n en los adultos ?Hypertension, Adult ?El t?rmino hipertensi?n es otra forma de denominar a la presi?n arterial elevada. La presi?n arterial elevada fuerza al coraz?n a trabajar m?s para bombear la sangre. Esto puede causar problemas con el paso del tiempo. ?Una lectura de presi?n arterial est? compuesta por 2 n?meros. Hay un n?mero superior (sist?lico) sobre un n?mero inferior (diast?lico). Lo ideal es tener la presi?n arterial por debajo de 120/80. Las elecciones saludables pueden ayudar a bajar la presi?n arterial, o tal vez necesite medicamentos para bajarla. ??Cu?les son las causas? ?Se desconoce la causa de esta afecci?n. Algunas afecciones pueden estar relacionadas con la presi?n arterial alta. ??Qu? incrementa el riesgo? ?Fumar. ?Tener diabetes mellitus tipo 2, colesterol alto, o ambos. ?No hacer la cantidad suficiente de actividad f?sica o ejercicio. ?Tener sobrepeso. ?Consumir mucha grasa, az?car, calor?as o sal (sodio) en su dieta. ?Beber alcohol en exceso. ?Tener una enfermedad renal a largo plazo (cr?nica). ?Tener antecedentes familiares de presi?n arterial alta. ?Edad. Los riesgos aumentan con la edad. ?Raza. El riesgo es mayor para las personas afroamericanas. ?Sexo. Antes de los 45 a?os, los hombres corren m?s riesgo que las mujeres. Despu?s de los 65 a?os, las mujeres corren m?s riesgo que los hombres. ?Tener apnea obstructiva del sue?o. ?Estr?s. ??Cu?les son los signos o los s?ntomas? ?Es posible que la presi?n arterial alta puede no cause s?ntomas. La presi?n arterial muy alta (crisis hipertensiva) puede provocar: ?Dolor de cabeza. ?Sensaciones de preocupaci?n o nerviosismo (ansiedad). ?Falta de aire. ?Hemorragia nasal. ?Sensaci?n de malestar en el est?mago (n?useas). ?V?mitos. ?Cambios en la forma de ver. ?Dolor muy intenso en el pecho. ?Convulsiones. ??C?mo se trata? ?Esta afecci?n se trata haciendo cambios saludables en el estilo de vida, por ejemplo: ?Consumir alimentos  saludables. ?Hacer m?s ejercicio. ?Beber menos alcohol. ?El m?dico puede recetarle medicamentos si los cambios en el estilo de vida no son suficientes para lograr controlar la presi?n arterial y si: ?El n?mero de arriba est? por encima de 130. ?El n?mero de abajo est? por encima de 80. ?Su presi?n arterial personal ideal puede variar. ?Siga estas instrucciones en su casa: ?Comida y bebida ? ?Si se lo dicen, siga el plan de alimentaci?n de DASH (Dietary Approaches to Stop Hypertension, Maneras de alimentarse para detener la hipertensi?n). Para seguir este plan: ?Llene la mitad del plato de cada comida con frutas y verduras. ?Llene un cuarto del plato de cada comida con cereales integrales. Los cereales integrales incluyen pasta integral, arroz integral y pan integral. ?Coma y beba productos l?cteos con bajo contenido de grasa, como leche descremada o yogur bajo en grasas. ?Llene un cuarto del plato de cada comida con prote?nas bajas en grasa (magras). Las prote?nas bajas en grasa incluyen pescado, pollo sin piel, huevos, frijoles y tofu. ?Evite consumir carne grasa, carne curada y procesada, o pollo con piel. ?Evite consumir alimentos prehechos o procesados. ?Consuma menos de 1500 mg de sal por d?a. ?No beba alcohol si: ?El m?dico le indica que no lo haga. ?Est? embarazada, puede estar embarazada o est? tratando de quedar embarazada. ?Si bebe alcohol: ?Limite la cantidad que bebe a lo siguiente: ?De 0 a 1 medida por d?a para las mujeres. ?De 0 a 2 medidas por d?a para los hombres. ?Est? atento a la cantidad de alcohol que hay en las bebidas que toma. En los [place], una medida equivale a una botella de cerveza de 12 oz (355 ml), un vaso de vino de 5 oz (148 ml) o un vaso de una bebida alcoh?lica de   alta graduaci?n de 1? oz (44 ml). ?Estilo de vida ? ?Trabaje con su m?dico para mantenerse en un peso saludable o para perder peso. Preg?ntele a su m?dico cu?l es el peso recomendable para usted. ?Haga al menos 30  minutos de ejercicio la mayor?a de los d?as de la semana. Estos pueden incluir caminar, nadar o andar en bicicleta. ?Realice al menos 30 minutos de ejercicio que fortalezca sus m?sculos (ejercicios de resistencia) al menos 3 d?as a la semana. Estos pueden incluir levantar pesas o hacer [place]. ?No consuma ning?n producto que contenga nicotina o tabaco, como cigarrillos, cigarrillos electr?nicos y tabaco de mascar. Si necesita ayuda para dejar de fumar, consulte al m?dico. ?Controle su presi?n arterial en su casa tal como le indic? el m?dico. ?Concurra a todas las visitas de seguimiento como se lo haya indicado el m?dico. Esto es importante. ?Medicamentos ?Tome los medicamentos de venta libre y los recetados solamente como se lo haya indicado el m?dico. Siga cuidadosamente las indicaciones. ?No omita las dosis de medicamentos para la presi?n arterial. Los medicamentos pierden eficacia si omite dosis. El hecho de omitir las dosis tambi?n aumenta el riesgo de otros problemas. ?Preg?ntele a su m?dico a qu? efectos secundarios o reacciones a los medicamentos debe prestar atenci?n. ?Comun?quese con un m?dico si: ?Piensa que tiene una reacci?n a los medicamentos que est? tomando. ?Tiene dolores de cabeza frecuentes (recurrentes). ?Se siente mareado. ?Tiene hinchaz?n en los tobillos. ?Tiene problemas de visi?n. ?Solicite ayuda inmediatamente si: ?Siente un dolor de cabeza muy intenso. ?Empieza a sentirse desorientado (confundido). ?Se siente d?bil o adormecido. ?Siente que va a desmayarse. ?Tiene un dolor muy intenso en las siguientes zonas: ?Pecho. ?Vientre (abdomen). ?Vomita m?s de una vez. ?Tiene dificultad para respirar. ?Resumen ?El t?rmino hipertensi?n es otra forma de denominar a la presi?n arterial elevada. ?La presi?n arterial elevada fuerza al coraz?n a trabajar m?s para bombear la sangre. ?Para la mayor?a de las personas, una presi?n arterial normal es menor que 120/80. ?Las decisiones saludables pueden ayudarle  a disminuir su presi?n arterial. Si no puede bajar su presi?n arterial mediante decisiones saludables, es posible que deba tomar medicamentos. ?Esta informaci?n no tiene como fin reemplazar el consejo del m?dico. Aseg?rese de hacerle al m?dico cualquier pregunta que tenga. ?Document Revised: 11/21/2017 Document Reviewed: 11/21/2017 ?Elsevier Patient Education ? 2022 Elsevier Inc. ? ?

## 2020-11-30 NOTE — Assessment & Plan Note (Signed)
Continue Plavix 75 mg daily and baby aspirin daily.

## 2020-11-30 NOTE — Assessment & Plan Note (Signed)
Well-controlled hypertension.  Continue valsartan 320 mg, carvedilol 12.5 mg twice a day, and amlodipine 5 mg daily. BP Readings from Last 3 Encounters:  11/30/20 116/60  10/19/20 104/60  09/20/20 124/62

## 2020-11-30 NOTE — Progress Notes (Signed)
[name] 80 y.o.   Chief Complaint  Patient presents with   Hypertension    6 month f/u. Med refills on lasix, crestor,valsartan, plavix, and amlodipine.    HISTORY OF PRESENT ILLNESS: This is a 80 y.o. male here for hypertension follow-up.  History of hypertensive heart disease. Patient also has history of coronary artery disease. Recently found to have a pericardial mass.  Follow-up chest CT scan done about 3 weeks ago.  Results reviewed with patient.  No significant changes from previous ones. History of ischemic stroke on Plavix and baby aspirin. Overall doing well.  Has no complaints or medical concerns. Most recent cardiologist visit assessment and plan as follows: ASSESSMENT:     1. Pericardial mass   2. Coronary artery disease involving native coronary artery of native heart without angina pectoris   3. Chronic diastolic heart failure ([place])   4. Essential hypertension   5. Mixed hyperlipidemia     PLAN:     In order of problems listed above:   Reviewed imaging findings in detail with the patient and his wife.  I think the only option for definitive diagnosis would be a biopsy.  The patient really is not interested in pursuing any invasive evaluation.  I think it is reasonable considering his completely asymptomatic status that we follow this up with a 16-monthCT scan for follow-up.  Discussed the possibility of surgery and/or biopsy and again he declines this. Stable without symptoms of angina.  Continue aspirin and clopidogrel.  Continue high intensity statin drug. The patient looks very good today.  His volume status is good as he appears euvolemic.  LV function is normal on most recent echocardiogram.  Continue current management. Blood pressure is well controlled on a combination of amlodipine, carvedilol, and valsartan. Lipids have been at goal on rosuvastatin 40 mg daily.   Hypertension Pertinent negatives include no chest pain, headaches, palpitations or  shortness of breath.    Prior to Admission medications   Medication Sig Start Date End Date Taking? Authorizing Provider  acetaminophen (TYLENOL) 325 MG tablet Take 325 mg by mouth daily.   Yes [provider]  amLODipine (NORVASC) 5 MG tablet Take 1 tablet (5 mg total) by mouth daily. 09/20/20 11/30/20 Yes Ed Rayson,Ines Bloomer MD  aspirin EC 81 MG tablet Take 81 mg by mouth daily.   Yes [provider]  carvedilol (COREG) 12.5 MG tablet Take 1 tablet (12.5 mg total) by mouth 2 (two) times daily. 07/27/20 07/22/21 Yes CSherren Mocha MD  clopidogrel (PLAVIX) 75 MG tablet Take 1 tablet (75 mg total) by mouth daily. 08/19/20 08/14/21 Yes CSherren Mocha MD  furosemide (LASIX) 40 MG tablet Take 1 tablet (40 mg total) by mouth every other day. 08/19/20 08/14/21 Yes CSherren Mocha MD  pantoprazole (PROTONIX) 40 MG tablet Take 1 tablet (40 mg total) by mouth daily. 09/20/20  Yes Shealyn Sean, MInes Bloomer MD  potassium chloride SA (KLOR-CON) 20 MEQ tablet Take 1 tablet (20 mEq total) by mouth every other day. 04/07/20 04/02/21 Yes Bhagat, Bhavinkumar, PA  rosuvastatin (CRESTOR) 40 MG tablet Take 1 tablet (40 mg total) by mouth daily. 08/19/20 08/14/21 Yes CSherren Mocha MD  valsartan (DIOVAN) 320 MG tablet Take 1 tablet (320 mg total) by mouth daily. 08/19/20  Yes CSherren Mocha MD    Allergies  Allergen Reactions   Lipitor [Atorvastatin] Shortness Of Breath and Other (See Comments)    Sores non head   Morphine And Related Shortness Of Breath, Swelling and Rash  Throat swelling   Pork-Derived Products Swelling and Rash    Tongue swelling No pork products -     Patient Active Problem List   Diagnosis Date Noted   Gait instability 09/20/2020   Balance problem 09/20/2020   Chronic low back pain 08/24/2020   Angina of effort ([place]) 08/08/2020   Type 2 diabetes mellitus with other specified complication, without long-term current use of insulin ([place]) 05/31/2020   Aortic atherosclerosis  ([place]) 05/31/2020   Acute on chronic diastolic heart failure ([place]) 02/14/2018   Pericardial effusion    Non-ST elevation (NSTEMI) myocardial infarction [facility])    Essential hypertension 05/17/2016   Ischemic stroke ([place]) 05/02/2016   pandiverticulosis 04/22/2012   Internal hemorrhoids 04/19/2012   Hyperlipidemia with target low density lipoprotein (LDL) cholesterol less than 70 mg/dL 06/14/2008   Hypertensive heart disease 06/14/2008   Coronary artery disease of native heart with stable angina pectoris, unspecified vessel or lesion type ([place]) 06/14/2008    Past Medical History:  Diagnosis Date   Blurred vision 05/17/2016   CAD (coronary artery disease), native coronary artery 06/14/2008   a. BMS to LCx & OM 2008 b. 03/2017 CABG x 4 (LIMA to LAD, SVG to diag 1, SVG to distal Circ, SVG to PDA)   Chest pain 04/16/2012   Diplopia    Echocardiogram    Echo 10/19: mod LVH, EF 55-60, no RWMA, Gr 1 DD, trivial AI, MAC, mod LAE, prob small post effusion   Essential hypertension 05/17/2016   Hyperlipidemia with target low density lipoprotein (LDL) cholesterol less than 70 mg/dL 06/14/2008   Qualifier: Diagnosis of  By: Mare Ferrari, RMA, [name]     Hypertension    Hypertensive heart disease 06/14/2008   Qualifier: Diagnosis of  By: Mare Ferrari, RMA, [name]     Hypertensive urgency, malignant 04/16/2012   Hypokalemia 04/14/2017   Internal hemorrhoids 04/19/2012   Ischemic stroke ([place]) 05/02/2016   Non-ST elevation (NSTEMI) myocardial infarction [facility])    pandiverticulosis 04/22/2012   12/17/2011. [place]. Juanita Craver MD. Colonoscopy. Moderate sized internal hemorrhoids and extensive pandiverticulosis. Repeat 5 years.     Pericardial effusion    Echo 11/19: mild focal basal septal hypertrophy, EF 55-60, Gr 2 DD, trivial AI, trivial TR, trivial PI, small to mod eff post to heart - no evidence of RV collapse.>> repeat limited echo in 03/2018 // Echo 03/2018: EF 55-60, mild LVH, +diastolic dysfunction, no  RWMA, PASP 23, trivial pericardial effusion    Polycythemia vera(238.4) 04/17/2012   S/P CABG x 4 04/17/2017   Syncope and collapse 04/17/2012   Pt syncopized while sitting in bed giving history @ time of admission to hospital Tachycardic (appeared sinus) to 120s-130s and hypotensive to 50s/30s.  Unresponsive initially >> Spontaneously resolved after 2-3 minutes >> return to baseline ~30 minutes    Vertigo     Past Surgical History:  Procedure Laterality Date   CORONARY ARTERY BYPASS GRAFT N/A 04/17/2017   Procedure: CORONARY ARTERY BYPASS GRAFTING (CABG) x4 , using left internal mammary artery  to LAD and right leg greater saphenous vein harvested endoscopically  to PDA, Diagonal I and Circumflex;  Surgeon: [name], MD;  Location: [facility];  Service: Open Heart Surgery;  Laterality: N/A;   CORONARY ARTERY BYPASS GRAFT  2020   CORONARY STENT PLACEMENT     LEFT HEART CATH AND CORONARY ANGIOGRAPHY N/A 04/16/2017   Procedure: LEFT HEART CATH AND CORONARY ANGIOGRAPHY;  Surgeon: Sherren Mocha, MD;  Location: [facility];  Service: Cardiovascular;  Laterality: N/A;   TEE WITHOUT CARDIOVERSION N/A 04/17/2017   Procedure: TRANSESOPHAGEAL ECHOCARDIOGRAM (TEE);  Surgeon: [name], MD;  Location: [facility];  Service: Open Heart Surgery;  Laterality: N/A;    Social History   Socioeconomic History   Marital status: Married    Spouse name: Not on file   Number of children: Not on file   Years of education: Not on file   Highest education level: Not on file  Occupational History   Not on file  Tobacco Use   Smoking status: Never   Smokeless tobacco: Never  Vaping Use   Vaping Use: Never used  Substance and Sexual Activity   Alcohol use: No    Alcohol/week: 0.0 standard drinks   Drug use: No   Sexual activity: Not on file  Other Topics Concern   Not on file  Social History Narrative   Lives in [place] with Wife and 2 sons.  From [place].  To [place] ~2000.     Currently  retired but worked in [place] Strain: Low Risk    Difficulty of Paying Living Expenses: Not hard at all  Food Insecurity: No Food Insecurity   Worried About Charity fundraiser in the Last Year: Never true   Arboriculturist in the Last Year: Never true  Transportation Needs: No Transportation Needs   Lack of Transportation (Medical): No   Lack of Transportation (Non-Medical): No  Physical Activity: Inactive   Days of Exercise per Week: 0 days   Minutes of Exercise per Session: 0 min  Stress: No Stress Concern Present   Feeling of Stress : Not at all  Social Connections: Socially Integrated   Frequency of Communication with Friends and Family: More than three times a week   Frequency of Social Gatherings with Friends and Family: Three times a week   Attends Religious Services: More than 4 times per year   Active Member of Clubs or Organizations: No   Attends Music therapist: More than 4 times per year   Marital Status: Married  Human resources officer Violence: Not At Risk   Fear of Current or Ex-Partner: No   Emotionally Abused: No   Physically Abused: No   Sexually Abused: No    Family History  Problem Relation Age of Onset   Heart disease Mother    Cancer Sister      Review of Systems  Constitutional: Negative.  Negative for chills and fever.  HENT: Negative.  Negative for congestion and sore throat.   Respiratory: Negative.  Negative for cough and shortness of breath.   Cardiovascular: Negative.  Negative for chest pain and palpitations.  Gastrointestinal: Negative.  Negative for abdominal pain, diarrhea, nausea and vomiting.  Genitourinary: Negative.  Negative for dysuria.  Musculoskeletal: Negative.   Skin: Negative.  Negative for rash.  Neurological: Negative.  Negative for dizziness and headaches.  All other systems reviewed and are negative.  Today's Vitals   11/30/20 1317  BP:  116/60  Pulse: 63  Temp: 98 F (36.7 C)  TempSrc: Oral  SpO2: 95%  Weight: 175 lb (79.4 kg)  Height: _0  (1.651 m)   Body mass index is 29.12 kg/m. Wt Readings from Last 3 Encounters:  11/30/20 175 lb (79.4 kg)  10/19/20 173 lb 6.4 oz (78.7 kg)  09/20/20 179 lb (81.2 kg)    Physical Exam Vitals reviewed.  Constitutional:  Appearance: Normal appearance.  HENT:     Head: Normocephalic.  Eyes:     Extraocular Movements: Extraocular movements intact.     Conjunctiva/sclera: Conjunctivae normal.     Pupils: Pupils are equal, round, and reactive to light.  Cardiovascular:     Rate and Rhythm: Normal rate and regular rhythm.     Pulses: Normal pulses.     Heart sounds: Normal heart sounds.  Pulmonary:     Effort: Pulmonary effort is normal.     Breath sounds: Normal breath sounds.  Abdominal:     Palpations: Abdomen is soft.     Tenderness: There is no abdominal tenderness.  Musculoskeletal:        General: Normal range of motion.     Cervical back: Normal range of motion and neck supple. No tenderness.     Right lower leg: No edema.     Left lower leg: No edema.  Skin:    General: Skin is warm and dry.     Capillary Refill: Capillary refill takes less than 2 seconds.  Neurological:     General: No focal deficit present.     Mental Status: He is alert and oriented to person, place, and time.  Psychiatric:        Mood and Affect: Mood normal.        Behavior: Behavior normal.     ASSESSMENT & PLAN: Problem List Items Addressed This Visit       Cardiovascular and Mediastinum   Hypertensive heart disease (Chronic)    Stable.  Euvolemic.  No signs of CHF. Continue Lasix 20 mg every other day.      Relevant Medications   valsartan (DIOVAN) 320 MG tablet   rosuvastatin (CRESTOR) 40 MG tablet   amLODipine (NORVASC) 5 MG tablet   carvedilol (COREG) 12.5 MG tablet   furosemide (LASIX) 40 MG tablet   Coronary artery disease of native heart with stable angina  pectoris, unspecified vessel or lesion type (HCC)    Stable.  No chest pain episodes      Relevant Medications   valsartan (DIOVAN) 320 MG tablet   rosuvastatin (CRESTOR) 40 MG tablet   amLODipine (NORVASC) 5 MG tablet   carvedilol (COREG) 12.5 MG tablet   furosemide (LASIX) 40 MG tablet   Ischemic stroke (HCC)    Continue Plavix 75 mg daily and baby aspirin daily.      Relevant Medications   valsartan (DIOVAN) 320 MG tablet   rosuvastatin (CRESTOR) 40 MG tablet   amLODipine (NORVASC) 5 MG tablet   carvedilol (COREG) 12.5 MG tablet   furosemide (LASIX) 40 MG tablet   Essential hypertension - Primary    Well-controlled hypertension.  Continue valsartan 320 mg, carvedilol 12.5 mg twice a day, and amlodipine 5 mg daily. BP Readings from Last 3 Encounters:  11/30/20 116/60  10/19/20 104/60  09/20/20 124/62         Relevant Medications   valsartan (DIOVAN) 320 MG tablet   rosuvastatin (CRESTOR) 40 MG tablet   amLODipine (NORVASC) 5 MG tablet   carvedilol (COREG) 12.5 MG tablet   furosemide (LASIX) 40 MG tablet   Aortic atherosclerosis (HCC)    Diet and nutrition discussed.  Continue rosuvastatin 40 mg daily.      Relevant Medications   valsartan (DIOVAN) 320 MG tablet   rosuvastatin (CRESTOR) 40 MG tablet   amLODipine (NORVASC) 5 MG tablet   carvedilol (COREG) 12.5 MG tablet   furosemide (LASIX) 40 MG tablet  Endocrine   Type 2 diabetes mellitus with other specified complication, without long-term current use of insulin (HCC)    Diet controlled. Lab Results  Component Value Date   HGBA1C 6.1 05/31/2020         Relevant Medications   valsartan (DIOVAN) 320 MG tablet   rosuvastatin (CRESTOR) 40 MG tablet   Other Visit Diagnoses     Need for influenza vaccination       Relevant Orders   Flu Vaccine QUAD High Dose(Fluad) (Completed)   Dyslipidemia       Relevant Medications   rosuvastatin (CRESTOR) 40 MG tablet   Chronic diastolic heart failure (HCC)        Relevant Medications   valsartan (DIOVAN) 320 MG tablet   rosuvastatin (CRESTOR) 40 MG tablet   amLODipine (NORVASC) 5 MG tablet   carvedilol (COREG) 12.5 MG tablet   furosemide (LASIX) 40 MG tablet   History of stroke       Relevant Medications   clopidogrel (PLAVIX) 75 MG tablet      Patient Instructions  Hipertensin en los adultos Hypertension, Adult El trmino hipertensin es otra forma de denominar a la presin arterial elevada. La presin arterial elevada fuerza al corazn a trabajar ms para bombear la sangre. Esto puede causar problemas con el paso del [place]. Una lectura de presin arterial est compuesta por 2 nmeros. Hay un nmero superior (sistlico) sobre un nmero inferior (diastlico). Lo ideal es tener la presin arterial por debajo de 120/80. Las elecciones saludables pueden ayudar a Engineer, materials presin arterial, o tal vez necesite medicamentos para bajarla. Cules son las causas? Se desconoce la causa de esta afeccin. Algunas afecciones pueden estar relacionadas con la presin arterial alta. Qu incrementa el riesgo? Fumar. Tener diabetes mellitus tipo 2, colesterol alto, o ambos. No hacer la cantidad suficiente de actividad fsica o ejercicio. Tener sobrepeso. Consumir mucha grasa, azcar, caloras o sal (sodio) en su dieta. Beber alcohol en exceso. Tener una enfermedad renal a largo plazo (crnica). Tener antecedentes familiares de presin arterial alta. Edad. Los riesgos aumentan con la edad. Raza. El riesgo es mayor para las Retail banker. Sexo. Antes de los 45 aos, los hombres corren ms [employer]. Despus de los 65 aos, las mujeres corren ms [employer]. Tener apnea obstructiva del sueo. Estrs. Cules son los signos o los sntomas? Es posible que la presin arterial alta puede no cause sntomas. La presin arterial muy alta (crisis hipertensiva) puede provocar: Dolor de cabeza. Sensaciones de preocupacin o  nerviosismo (ansiedad). Falta de aire. Hemorragia nasal. Sensacin de malestar en el estmago (nuseas). Vmitos. Cambios en la forma de ver. Dolor muy intenso en el pecho. Convulsiones. Cmo se trata? Esta afeccin se trata haciendo cambios saludables en el estilo de vida, por ejemplo: Consumir alimentos saludables. Hacer ms ejercicio. Beber menos alcohol. El mdico puede recetarle medicamentos si los cambios en el estilo de vida no son suficientes para Child psychotherapist la presin arterial y si: El nmero de arriba est por encima de 130. El nmero de abajo est por encima de 80. Su presin arterial personal ideal puede variar. Siga estas instrucciones en su casa: Comida y bebida  Si se lo dicen, siga el plan de alimentacin de DASH (Dietary Approaches to Stop Hypertension, Maneras de alimentarse para detener la hipertensin). Para seguir este plan: Llene la mitad del plato de cada comida con frutas y verduras. Llene un cuarto del plato de cada comida con cereales integrales. Los cereales  integrales incluyen pasta integral, arroz integral y pan integral. Coma y beba productos lcteos con bajo contenido de grasa, como leche descremada o yogur bajo en grasas. Llene un cuarto del plato de cada comida con protenas bajas en grasa (magras). Las protenas bajas en grasa incluyen pescado, pollo sin piel, huevos, frijoles y tofu. Evite consumir carne grasa, carne curada y procesada, o pollo con piel. Evite consumir alimentos prehechos o procesados. Consuma menos de 1500 mg de sal por da. No beba alcohol si: El mdico le indica que no lo haga. Est embarazada, puede estar embarazada o est tratando de [place]. Si bebe alcohol: Limite la cantidad que bebe a lo siguiente: De 0 a 1 medida por da para las mujeres. De 0 a 2 medidas por da para los hombres. Est atento a la cantidad de alcohol que hay en las bebidas que toma. En los [place], una medida equivale a una  botella de cerveza de 12 oz (355 ml), un vaso de vino de 5 oz (148 ml) o un vaso de una bebida alcohlica de alta graduacin de 1 oz (44 ml). Estilo de vida  Trabaje con su mdico para mantenerse en un peso saludable o para perder peso. Pregntele a su mdico cul es el peso recomendable para usted. Haga al menos 30 minutos de ejercicio la [employer] de la [place]. Estos pueden incluir caminar, nadar o andar en bicicleta. Realice al menos 30 minutos de ejercicio que fortalezca sus msculos (ejercicios de resistencia) al menos 3 das a la [place]. Estos pueden incluir levantar pesas o hacer [place]. No consuma ningn producto que contenga nicotina o tabaco, como cigarrillos, cigarrillos electrnicos y tabaco de Higher education careers adviser. Si necesita ayuda para dejar de fumar, consulte al [employer]. Controle su presin arterial en su casa tal como le indic el mdico. Concurra a todas las visitas de seguimiento como se lo haya indicado el mdico. Esto es importante. Medicamentos [employer] de venta libre y los recetados solamente como se lo haya indicado el mdico. Siga cuidadosamente las indicaciones. No omita las dosis de medicamentos para la presin arterial. Los medicamentos pierden eficacia si omite dosis. El hecho de omitir las dosis tambin [place] el riesgo de otros problemas. Pregntele a su mdico a qu efectos secundarios o reacciones a los Careers information officer. Comunquese con un mdico si: Piensa que tiene [place] reaccin a los medicamentos que est tomando. Tiene dolores de cabeza frecuentes (recurrentes). Se siente mareado. Tiene hinchazn en los tobillos. Tiene problemas de visin. Solicite ayuda inmediatamente si: Siente un dolor de cabeza muy intenso. Empieza a sentirse desorientado (confundido). Se siente dbil o adormecido. Siente que va a desmayarse. Tiene un dolor muy intenso en las siguientes zonas: Pecho. Vientre (abdomen). Vomita ms de una vez. Tiene  dificultad para respirar. Resumen El trmino hipertensin es otra forma de denominar a la presin arterial elevada. La presin arterial elevada fuerza al corazn a trabajar ms para bombear la sangre. Para la [employer], una presin arterial normal es menor que 120/80. Las decisiones saludables pueden ayudarle a disminuir su presin arterial. Si no puede bajar su presin arterial mediante decisiones saludables, es posible que deba tomar medicamentos. Esta informacin no tiene Marine scientist el consejo del mdico. Asegrese de hacerle al mdico cualquier pregunta que tenga. Document Revised: 11/21/2017 Document Reviewed: 11/21/2017 Elsevier Patient Education  2022 [place], MD [facility]

## 2020-11-30 NOTE — Assessment & Plan Note (Signed)
Diet controlled. Lab Results  Component Value Date   HGBA1C 6.1 05/31/2020

## 2020-11-30 NOTE — Assessment & Plan Note (Signed)
Stable.  No chest pain episodes

## 2020-11-30 NOTE — Assessment & Plan Note (Signed)
Stable.  Euvolemic.  No signs of CHF. Continue Lasix 20 mg every other day.

## 2021-02-22 ENCOUNTER — Ambulatory Visit: Payer: Medicare HMO | Admitting: Cardiovascular Disease

## 2021-03-01 ENCOUNTER — Telehealth: Payer: Self-pay

## 2021-03-01 DIAGNOSIS — E785 Hyperlipidemia, unspecified: Secondary | ICD-10-CM

## 2021-03-01 DIAGNOSIS — I10 Essential (primary) hypertension: Secondary | ICD-10-CM

## 2021-03-01 NOTE — Telephone Encounter (Signed)
Patient came up to the office to complain about his Lasix. Patient takes Lasix 40 mg every other day, and he stated that the medication is making him crazy. Patient complaining about having to go to the bathroom all the time and even at night, and that he is unable to sleep. Patient wants to stop taking Lasix. Patient does not have any recent BP and HR readings. Patient stated if he needs to stay on it, that he needs refills on the lasix and other cardiac medications. Patient has follow up with Dr. Burt Knack in March. Will forward to Dr. Burt Knack for advisement.

## 2021-03-02 MED ORDER — CARVEDILOL 12.5 MG PO TABS: 12.5000 mg | ORAL_TABLET | Freq: Two times a day (BID) | ORAL | 3 refills | Status: DC

## 2021-03-02 MED ORDER — AMLODIPINE BESYLATE 5 MG PO TABS: 5.0000 mg | ORAL_TABLET | Freq: Every day | ORAL | 3 refills | Status: DC

## 2021-03-02 MED ORDER — ROSUVASTATIN CALCIUM 40 MG PO TABS: 40.0000 mg | ORAL_TABLET | Freq: Every day | ORAL | 3 refills | Status: DC

## 2021-03-02 MED ORDER — VALSARTAN 320 MG PO TABS: 320.0000 mg | ORAL_TABLET | Freq: Every day | ORAL | 3 refills | Status: DC

## 2021-03-02 NOTE — Telephone Encounter (Signed)
I would be ok with him coming off of lasix and just using it as needed since he is having so much trouble with it. He should continue all of cardiac medications. thanks

## 2021-03-02 NOTE — Telephone Encounter (Signed)
Called patient with Dr. Antionette Char advisement and he verbalized understanding. Will update medication list.

## 2021-03-02 NOTE — Telephone Encounter (Signed)
Call pt. No answer. Voicemail not set up.

## 2021-03-07 ENCOUNTER — Other Ambulatory Visit: Payer: Self-pay

## 2021-03-07 ENCOUNTER — Ambulatory Visit (INDEPENDENT_AMBULATORY_CARE_PROVIDER_SITE_OTHER): Payer: Medicare HMO | Admitting: Emergency Medicine

## 2021-03-07 ENCOUNTER — Ambulatory Visit: Payer: Medicare HMO | Admitting: Emergency Medicine

## 2021-03-07 ENCOUNTER — Encounter: Payer: Self-pay | Admitting: Emergency Medicine

## 2021-03-07 VITALS — BP 120/70 | HR 71 | Temp 97.8°F | Ht 65.0 in | Wt 174.0 lb

## 2021-03-07 DIAGNOSIS — E119 Type 2 diabetes mellitus without complications: Secondary | ICD-10-CM | POA: Diagnosis not present

## 2021-03-07 DIAGNOSIS — E785 Hyperlipidemia, unspecified: Secondary | ICD-10-CM

## 2021-03-07 DIAGNOSIS — I10 Essential (primary) hypertension: Secondary | ICD-10-CM

## 2021-03-07 DIAGNOSIS — I5032 Chronic diastolic (congestive) heart failure: Secondary | ICD-10-CM | POA: Diagnosis not present

## 2021-03-07 DIAGNOSIS — Z8673 Personal history of transient ischemic attack (TIA), and cerebral infarction without residual deficits: Secondary | ICD-10-CM

## 2021-03-07 DIAGNOSIS — I110 Hypertensive heart disease with heart failure: Secondary | ICD-10-CM | POA: Diagnosis not present

## 2021-03-07 LAB — COMPREHENSIVE METABOLIC PANEL
ALT: 14 U/L (ref 0–53)
AST: 15 U/L (ref 0–37)
Albumin: 4 g/dL (ref 3.5–5.2)
Alkaline Phosphatase: 90 U/L (ref 39–117)
BUN: 14 mg/dL (ref 6–23)
CO2: 28 mEq/L (ref 19–32)
Calcium: 9.6 mg/dL (ref 8.4–10.5)
Chloride: 102 mEq/L (ref 96–112)
Creatinine, Ser: 0.8 mg/dL (ref 0.40–1.50)
GFR: 84.58 mL/min (ref >60.00)
Glucose, Bld: 100 mg/dL — ABNORMAL HIGH (ref 70–99)
Potassium: 4 mEq/L (ref 3.5–5.1)
Sodium: 138 mEq/L (ref 135–145)
Total Bilirubin: 0.8 mg/dL (ref 0.2–1.2)
Total Protein: 7.8 g/dL (ref 6.0–8.3)

## 2021-03-07 LAB — HEMOGLOBIN A1C: Hgb A1c MFr Bld: 6 % (ref 4.6–6.5)

## 2021-03-07 LAB — LIPID PANEL
Cholesterol: 111 mg/dL (ref 0–200)
HDL: 39.1 mg/dL (ref >39.00)
LDL Cholesterol: 51 mg/dL (ref 0–99)
NonHDL: 71.85
Total CHOL/HDL Ratio: 3
Triglycerides: 102 mg/dL (ref 0.0–149.0)
VLDL: 20.4 mg/dL (ref 0.0–40.0)

## 2021-03-07 MED ORDER — ASPIRIN EC 81 MG PO TBEC: 81.0000 mg | DELAYED_RELEASE_TABLET | Freq: Every day | ORAL | 3 refills | Status: AC

## 2021-03-07 MED ORDER — ROSUVASTATIN CALCIUM 40 MG PO TABS: 40.0000 mg | ORAL_TABLET | Freq: Every day | ORAL | 3 refills | Status: DC

## 2021-03-07 MED ORDER — AMLODIPINE BESYLATE 5 MG PO TABS: 5.0000 mg | ORAL_TABLET | Freq: Every day | ORAL | 3 refills | Status: DC

## 2021-03-07 MED ORDER — CARVEDILOL 12.5 MG PO TABS: 12.5000 mg | ORAL_TABLET | Freq: Two times a day (BID) | ORAL | 3 refills | Status: DC

## 2021-03-07 MED ORDER — PANTOPRAZOLE SODIUM 40 MG PO TBEC: 40.0000 mg | DELAYED_RELEASE_TABLET | Freq: Every day | ORAL | 3 refills | Status: DC

## 2021-03-07 MED ORDER — VALSARTAN 320 MG PO TABS: 320.0000 mg | ORAL_TABLET | Freq: Every day | ORAL | 3 refills | Status: DC

## 2021-03-07 NOTE — Progress Notes (Signed)
Henry Utsey 81 y.o.   Chief Complaint  Patient presents with   Hypertension    3 month f /u, choleserol    HISTORY OF PRESENT ILLNESS: This is a 82 y.o. male here for follow-up of hypertension and dyslipidemia. Has history of congestive heart failure.  Stable.  No longer taking Lasix due to excessive diuresis affecting quality of life.  Change was directed by cardiologist. Doing well.  Has no complaints or medical concerns today.  Hypertension Pertinent negatives include no chest pain, headaches, palpitations or shortness of breath.    Prior to Admission medications   Medication Sig Start Date End Date Taking? Authorizing Provider  acetaminophen (TYLENOL) 325 MG tablet Take 325 mg by mouth daily.   Yes [provider]  amLODipine (NORVASC) 5 MG tablet Take 1 tablet (5 mg total) by mouth daily. 03/02/21  Yes Sherren Mocha, MD  aspirin EC 81 MG tablet Take 81 mg by mouth daily.   Yes [provider]  carvedilol (COREG) 12.5 MG tablet Take 1 tablet (12.5 mg total) by mouth 2 (two) times daily. 03/02/21  Yes Sherren Mocha, MD  pantoprazole (PROTONIX) 40 MG tablet Take 1 tablet (40 mg total) by mouth daily. 09/20/20  Yes Lindzy Rupert, Ines Bloomer, MD  rosuvastatin (CRESTOR) 40 MG tablet Take 1 tablet (40 mg total) by mouth daily. 03/02/21  Yes Sherren Mocha, MD  valsartan (DIOVAN) 320 MG tablet Take 1 tablet (320 mg total) by mouth daily. 03/02/21  Yes Sherren Mocha, MD    Allergies  Allergen Reactions   Lipitor [Atorvastatin] Shortness Of Breath and Other (See Comments)    Sores non head   Morphine And Related Shortness Of Breath, Swelling and Rash    Throat swelling   Pork-Derived Products Swelling and Rash    Tongue swelling No pork products -     Patient Active Problem List   Diagnosis Date Noted   Gait instability 09/20/2020   Balance problem 09/20/2020   Chronic low back pain 08/24/2020   Angina of effort ([place]) 08/08/2020   Type 2 diabetes mellitus  with other specified complication, without long-term current use of insulin ([place]) 05/31/2020   Aortic atherosclerosis ([place]) 05/31/2020   Acute on chronic diastolic heart failure ([place]) 02/14/2018   Pericardial effusion    Non-ST elevation (NSTEMI) myocardial infarction [facility])    Essential hypertension 05/17/2016   Ischemic stroke ([place]) 05/02/2016   pandiverticulosis 04/22/2012   Internal hemorrhoids 04/19/2012   Hyperlipidemia with target low density lipoprotein (LDL) cholesterol less than 70 mg/dL 06/14/2008   Hypertensive heart disease 06/14/2008   Coronary artery disease of native heart with stable angina pectoris, unspecified vessel or lesion type ([place]) 06/14/2008    Past Medical History:  Diagnosis Date   Blurred vision 05/17/2016   CAD (coronary artery disease), native coronary artery 06/14/2008   a. BMS to LCx & OM 2008 b. 03/2017 CABG x 4 (LIMA to LAD, SVG to diag 1, SVG to distal Circ, SVG to PDA)   Chest pain 04/16/2012   Diplopia    Echocardiogram    Echo 10/19: mod LVH, EF 55-60, no RWMA, Gr 1 DD, trivial AI, MAC, mod LAE, prob small post effusion   Essential hypertension 05/17/2016   Hyperlipidemia with target low density lipoprotein (LDL) cholesterol less than 70 mg/dL 06/14/2008   Qualifier: Diagnosis of  By: Mare Ferrari, RMA, [name]     Hypertension    Hypertensive heart disease 06/14/2008   Qualifier: Diagnosis of  By: Mare Ferrari, [place], [name]  Hypertensive urgency, malignant 04/16/2012   Hypokalemia 04/14/2017   Internal hemorrhoids 04/19/2012   Ischemic stroke ([place]) 05/02/2016   Non-ST elevation (NSTEMI) myocardial infarction [facility])    pandiverticulosis 04/22/2012   12/17/2011. [place]. Juanita Craver MD. Colonoscopy. Moderate sized internal hemorrhoids and extensive pandiverticulosis. Repeat 5 years.     Pericardial effusion    Echo 11/19: mild focal basal septal hypertrophy, EF 55-60, Gr 2 DD, trivial AI, trivial TR, trivial PI, small to mod eff post to heart - no  evidence of RV collapse.>> repeat limited echo in 03/2018 // Echo 03/2018: EF 55-60, mild LVH, +diastolic dysfunction, no RWMA, PASP 23, trivial pericardial effusion    Polycythemia vera(238.4) 04/17/2012   S/P CABG x 4 04/17/2017   Syncope and collapse 04/17/2012   Pt syncopized while sitting in bed giving history @ time of admission to hospital Tachycardic (appeared sinus) to 120s-130s and hypotensive to 50s/30s.  Unresponsive initially >> Spontaneously resolved after 2-3 minutes >> return to baseline ~30 minutes    Vertigo     Past Surgical History:  Procedure Laterality Date   CORONARY ARTERY BYPASS GRAFT N/A 04/17/2017   Procedure: CORONARY ARTERY BYPASS GRAFTING (CABG) x4 , using left internal mammary artery  to LAD and right leg greater saphenous vein harvested endoscopically  to PDA, Diagonal I and Circumflex;  Surgeon: [name], MD;  Location: [place];  Service: Open Heart Surgery;  Laterality: N/A;   CORONARY ARTERY BYPASS GRAFT  2020   CORONARY STENT PLACEMENT     LEFT HEART CATH AND CORONARY ANGIOGRAPHY N/A 04/16/2017   Procedure: LEFT HEART CATH AND CORONARY ANGIOGRAPHY;  Surgeon: Sherren Mocha, MD;  Location: [facility] [facility];  Service: Cardiovascular;  Laterality: N/A;   TEE WITHOUT CARDIOVERSION N/A 04/17/2017   Procedure: TRANSESOPHAGEAL ECHOCARDIOGRAM (TEE);  Surgeon: [name], MD;  Location: [place];  Service: Open Heart Surgery;  Laterality: N/A;    Social History   Socioeconomic History   Marital status: Married    Spouse name: Not on file   Number of children: Not on file   Years of education: Not on file   Highest education level: Not on file  Occupational History   Not on file  Tobacco Use   Smoking status: Never   Smokeless tobacco: Never  Vaping Use   Vaping Use: Never used  Substance and Sexual Activity   Alcohol use: No    Alcohol/week: 0.0 standard drinks   Drug use: No   Sexual activity: Not on file  Other Topics Concern   Not on file   Social History Narrative   Lives in [place] with Wife and 2 sons.  From [place].  To [place] ~2000.     Currently retired but worked in [place] Strain: Low Risk    Difficulty of Paying Living Expenses: Not hard at all  Food Insecurity: No Food Insecurity   Worried About Charity fundraiser in the Last Year: Never true   Arboriculturist in the Last Year: Never true  Transportation Needs: No Transportation Needs   Lack of Transportation (Medical): No   Lack of Transportation (Non-Medical): No  Physical Activity: Inactive   Days of Exercise per Week: 0 days   Minutes of Exercise per Session: 0 min  Stress: No Stress Concern Present   Feeling of Stress : Not at all  Social Connections: Socially Integrated   Frequency of  Communication with Friends and Family: More than three times a week   Frequency of Social Gatherings with Friends and Family: Three times a week   Attends Religious Services: More than 4 times per year   Active Member of Clubs or Organizations: No   Attends Music therapist: More than 4 times per year   Marital Status: Married  Human resources officer Violence: Not At Risk   Fear of Current or Ex-Partner: No   Emotionally Abused: No   Physically Abused: No   Sexually Abused: No    Family History  Problem Relation Age of Onset   Heart disease Mother    Cancer Sister      Review of Systems  Constitutional: Negative.  Negative for chills and fever.  HENT: Negative.  Negative for congestion and sore throat.   Respiratory: Negative.  Negative for cough and shortness of breath.   Cardiovascular: Negative.  Negative for chest pain and palpitations.  Gastrointestinal: Negative.  Negative for abdominal pain, diarrhea, nausea and vomiting.  Genitourinary: Negative.  Negative for dysuria and hematuria.  Skin: Negative.  Negative for rash.  Neurological: Negative.  Negative for dizziness and  headaches.  All other systems reviewed and are negative.  Today's Vitals   03/07/21 1055  BP: 140/78  Pulse: 71  Temp: 97.8 F (36.6 C)  TempSrc: Oral  SpO2: 97%  Weight: 174 lb (78.9 kg)  Height: _0  (1.651 m)   Body mass index is 28.96 kg/m.  Physical Exam Vitals reviewed.  Constitutional:      Appearance: Normal appearance.  HENT:     Head: Normocephalic.  Eyes:     Extraocular Movements: Extraocular movements intact.     Conjunctiva/sclera: Conjunctivae normal.     Pupils: Pupils are equal, round, and reactive to light.  Cardiovascular:     Rate and Rhythm: Normal rate and regular rhythm.     Pulses: Normal pulses.     Heart sounds: Normal heart sounds.  Pulmonary:     Effort: Pulmonary effort is normal.     Breath sounds: Normal breath sounds.  Abdominal:     Palpations: Abdomen is soft.     Tenderness: There is no abdominal tenderness.  Musculoskeletal:     Cervical back: Neck supple. No tenderness.     Right lower leg: No edema.     Left lower leg: No edema.  Lymphadenopathy:     Cervical: No cervical adenopathy.  Skin:    General: Skin is warm and dry.     Capillary Refill: Capillary refill takes less than 2 seconds.  Neurological:     General: No focal deficit present.     Mental Status: He is alert and oriented to person, place, and time.  Psychiatric:        Mood and Affect: Mood normal.        Behavior: Behavior normal.     ASSESSMENT & PLAN: Problem List Items Addressed This Visit       Cardiovascular and Mediastinum   Hypertensive heart disease (Chronic)    Well-controlled hypertension. BP Readings from Last 3 Encounters:  03/07/21 120/70  11/30/20 116/60  10/19/20 104/60  Continue valsartan 320 mg daily, amlodipine 5 mg daily and carvedilol 12.5 mg twice a day.       Relevant Medications   valsartan (DIOVAN) 320 MG tablet   rosuvastatin (CRESTOR) 40 MG tablet   carvedilol (COREG) 12.5 MG tablet   aspirin EC 81 MG tablet    amLODipine (NORVASC)  5 MG tablet   Essential hypertension - Primary   Relevant Medications   valsartan (DIOVAN) 320 MG tablet   rosuvastatin (CRESTOR) 40 MG tablet   carvedilol (COREG) 12.5 MG tablet   aspirin EC 81 MG tablet   amLODipine (NORVASC) 5 MG tablet   Other Relevant Orders   Comprehensive metabolic panel   Hemoglobin A1c   Lipid panel   Chronic diastolic heart failure (HCC)    Stable.  Euvolemic.  Off Lasix at present time and doing well.      Relevant Medications   valsartan (DIOVAN) 320 MG tablet   rosuvastatin (CRESTOR) 40 MG tablet   carvedilol (COREG) 12.5 MG tablet   aspirin EC 81 MG tablet   amLODipine (NORVASC) 5 MG tablet     Other   Dyslipidemia    Stable.  Diet and nutrition discussed. Continue rosuvastatin 40 mg daily.      Relevant Medications   rosuvastatin (CRESTOR) 40 MG tablet   Other Relevant Orders   Hemoglobin A1c   Lipid panel   History of stroke   Patient Instructions  Mantenimiento de la salud despus de los 10 aos de edad Health Maintenance After Age 38 Despus de los 65 aos de edad, corre un riesgo mayor de Tourist information centre manager enfermedades e infecciones a Barrister's clerk, como tambin de sufrir lesiones por cadas. Las cadas son la causa principal de las fracturas de huesos y lesiones en la cabeza de personas mayores de 41 aos de edad. Recibir cuidados preventivos de forma regular puede ayudarlo a mantenerse saludable y en buen [place]. Los cuidados preventivos incluyen realizarse anlisis de forma regular y Actor en el estilo de vida segn las recomendaciones del mdico. Converse con el mdico sobre lo siguiente: Las pruebas de deteccin y los anlisis que debe Dispensing optician. Una prueba de deteccin es un estudio que se para Hydrographic surveyor la presencia de una enfermedad cuando no tiene sntomas. Un plan de dieta y ejercicios adecuado para usted. Qu debo saber sobre las pruebas de deteccin y los anlisis para prevenir cadas? Realizarse  pruebas de deteccin y [employer] es la mejor manera de Hydrographic surveyor un problema de salud de forma temprana. El diagnstico y tratamiento tempranos le brindan la mejor oportunidad de Chief Technology Officer las afecciones mdicas que son comunes despus de los 65 aos de edad. Ciertas afecciones y elecciones de estilo de vida pueden hacer que sea ms propenso a sufrir Engineer, manufacturing. El mdico puede recomendarle lo siguiente: Controles regulares de la visin. Una visin deficiente y afecciones como las cataratas pueden hacer que sea ms propenso a sufrir Engineer, manufacturing. Si [place] lentes, asegrese de obtener una receta actualizada si su visin cambia. Revisin de medicamentos. Revise regularmente con el mdico todos los medicamentos que toma, incluidos los medicamentos de [place]. Consulte al [employer] efectos secundarios que pueden hacer que sea ms propenso a sufrir Engineer, manufacturing. Informe al mdico si alguno de los medicamentos que toma lo hace sentir mareado o somnoliento. Controles de fuerza y equilibrio. El mdico puede recomendar ciertos estudios para controlar su fuerza y equilibrio al estar de pie, al caminar o al cambiar de posicin. Examen de los pies. El dolor y Chiropractor en los pies, como tambin no utilizar el calzado [place], pueden hacer que sea ms propenso a sufrir Engineer, manufacturing. Pruebas de deteccin, que incluyen las siguientes: Pruebas de deteccin para la osteoporosis. La osteoporosis es una afeccin que hace que los huesos se tornen ms dbiles y se quiebren con ms  facilidad. Pruebas de deteccin para la presin arterial. Los cambios en la presin arterial y los medicamentos para Chief Technology Officer la presin arterial pueden hacerlo sentir mareado. Prueba de deteccin de la depresin. Es ms probable que sufra una cada si tiene miedo a caerse, se siente deprimido o se siente incapaz de Patent examiner. Prueba de deteccin de consumo de alcohol. Beber demasiado alcohol puede afectar su  equilibrio y puede hacer que sea ms propenso a sufrir Engineer, manufacturing. Siga estas indicaciones en su casa: Estilo de vida No beba alcohol si: Su mdico le indica no hacerlo. Si bebe alcohol: Limite la cantidad que bebe a lo siguiente: De 0 a 1 medida por da para las mujeres. De 0 a 2 medidas por da para los hombres. Sepa cunta cantidad de alcohol hay en las bebidas que toma. En los [place], una medida equivale a una botella de cerveza de 12 oz (355 ml), un vaso de vino de 5 oz (148 ml) o un vaso de una bebida alcohlica de alta graduacin de 1 oz (44 ml). No consuma ningn producto que contenga nicotina o tabaco. Estos productos incluyen cigarrillos, tabaco para Higher education careers adviser y aparatos de vapeo, como los Psychologist, sport and exercise. Si necesita ayuda para dejar de consumir estos productos, consulte al [employer]. Actividad  Siga un programa de ejercicio regular para mantenerse en forma. Esto lo ayudar a Contractor equilibrio. Consulte al mdico qu tipos de ejercicios son adecuados para usted. Si necesita un bastn o un andador, selo segn las recomendaciones del mdico. Utilice calzado con buen apoyo y suela antideslizante. Seguridad  Retire los [place] puedan causar tropiezos tales como alfombras, cables u obstculos. Instale equipos de seguridad, como barras para sostn en los baos y barandas de seguridad en las escaleras. [place] habitaciones y los pasillos bien iluminados. Indicaciones generales Hable con el mdico sobre sus riesgos de sufrir una cada. Infrmele a su mdico si: Se cae. Asegrese de informarle a su mdico acerca de todas las cadas, incluso aquellas que parecen ser [employer] Se siente mareado, cansado (tiene fatiga) o siente que pierde el equilibrio. Use los medicamentos de venta libre y los recetados solamente como se lo haya indicado el mdico. Estos incluyen suplementos. Siga una dieta sana y [place] un peso saludable. Una dieta saludable incluye productos  lcteos descremados, carnes bajas en contenido de grasa ([place]), fibra de granos enteros, frijoles y [place] frutas y verduras. [place]. Realcese los estudios de rutina de la salud, dentales y de Public librarian. Resumen Tener un estilo de vida saludable y recibir cuidados preventivos pueden ayudar a Theatre stage manager salud y el bienestar despus de los 83 aos de [place]. Realizarse pruebas de deteccin y [employer] es la mejor manera de Hydrographic surveyor un problema de salud de forma temprana y Lourena Simmonds a Product/process development scientist una cada. El diagnstico y tratamiento tempranos le brindan la mejor oportunidad de Chief Technology Officer las afecciones mdicas ms comunes en las personas mayores de 33 aos de edad. Las cadas son la causa principal de las fracturas de huesos y lesiones en la cabeza de personas mayores de 70 aos de edad. Tome precauciones para evitar una cada en su casa. Trabaje con el mdico para saber qu cambios que puede hacer para mejorar su salud y [place], y [place]. Esta informacin no tiene Marine scientist el consejo del mdico. Asegrese de hacerle al mdico cualquier pregunta que tenga. Document Revised: 07/13/2020 Document Reviewed: 07/13/2020 Elsevier Patient Education  2022 Elsevier  Inc.     Agustina Caroli, MD [facility]

## 2021-03-07 NOTE — Assessment & Plan Note (Signed)
Stable.  Euvolemic.  Off Lasix at present time and doing well.

## 2021-03-07 NOTE — Patient Instructions (Signed)
Mantenimiento de la salud despu?s de los 65 a?os de edad ?Health Maintenance After Age 79 ?Despu?s de los 65 a?os de edad, corre un riesgo mayor de padecer ciertas enfermedades e infecciones a largo plazo, como tambi?n de sufrir lesiones por ca?das. Las ca?das son la causa principal de las fracturas de huesos y lesiones en la cabeza de personas mayores de 65 a?os de edad. Recibir cuidados preventivos de forma regular puede ayudarlo a mantenerse saludable y en buen estado. Los cuidados preventivos incluyen realizarse an?lisis de forma regular y realizar cambios en el estilo de vida seg?n las recomendaciones del m?dico. Converse con el m?dico sobre lo siguiente: ?Las pruebas de detecci?n y los an?lisis que debe realizarse. Una prueba de detecci?n es un estudio que se para detectar la presencia de una enfermedad cuando no tiene s?ntomas. ?Un plan de dieta y ejercicios adecuado para usted. ??Qu? debo saber sobre las pruebas de detecci?n y los an?lisis para prevenir ca?das? ?Realizarse pruebas de detecci?n y an?lisis es la mejor manera de detectar un problema de salud de forma temprana. El diagn?stico y tratamiento tempranos le brindan la mejor oportunidad de controlar las afecciones m?dicas que son comunes despu?s de los 65 a?os de edad. Ciertas afecciones y elecciones de estilo de vida pueden hacer que sea m?s propenso a sufrir una ca?da. El m?dico puede recomendarle lo siguiente: ?Controles regulares de la visi?n. Una visi?n deficiente y afecciones como las cataratas pueden hacer que sea m?s propenso a sufrir una ca?da. Si usa lentes, aseg?rese de obtener una receta actualizada si su visi?n cambia. ?Revisi?n de medicamentos. Revise regularmente con el m?dico todos los medicamentos que toma, incluidos los medicamentos de venta libre. Consulte al m?dico sobre los efectos secundarios que pueden hacer que sea m?s propenso a sufrir una ca?da. Informe al m?dico si alguno de los medicamentos que toma lo hace sentir mareado o  somnoliento. ?Controles de fuerza y equilibrio. El m?dico puede recomendar ciertos estudios para controlar su fuerza y equilibrio al estar de pie, al caminar o al cambiar de posici?n. ?Examen de los pies. El dolor y el adormecimiento en los pies, como tambi?n no utilizar el calzado adecuado, pueden hacer que sea m?s propenso a sufrir una ca?da. ?Pruebas de detecci?n, que incluyen las siguientes: ?Pruebas de detecci?n para la osteoporosis. La osteoporosis es una afecci?n que hace que los huesos se tornen m?s d?biles y se quiebren con m?s facilidad. ?Pruebas de detecci?n para la presi?n arterial. Los cambios en la presi?n arterial y los medicamentos para controlar la presi?n arterial pueden hacerlo sentir mareado. ?Prueba de detecci?n de la depresi?n. Es m?s probable que sufra una ca?da si tiene miedo a caerse, se siente deprimido o se siente incapaz de realizar actividades que sol?a hacer. ?Prueba de detecci?n de consumo de alcohol. Beber demasiado alcohol puede afectar su equilibrio y puede hacer que sea m?s propenso a sufrir una ca?da. ?Siga estas indicaciones en su casa: ?Estilo de vida ?No beba alcohol si: ?Su m?dico le indica no hacerlo. ?Si bebe alcohol: ?Limite la cantidad que bebe a lo siguiente: ?De 0 a 1 medida por d?a para las mujeres. ?De 0 a 2 medidas por d?a para los hombres. ?Sepa cu?nta cantidad de alcohol hay en las bebidas que toma. En los [place], una medida equivale a una botella de cerveza de 12 oz (355 ml), un vaso de vino de 5 oz (148 ml) o un vaso de una bebida alcoh?lica de alta graduaci?n de 1? oz (44 ml). ?No consuma ning?n producto que   contenga nicotina o tabaco. Estos productos incluyen cigarrillos, tabaco para mascar y aparatos de vapeo, como los cigarrillos electr?nicos. Si necesita ayuda para dejar de consumir estos productos, consulte al m?dico. ?Actividad ? ?Siga un programa de ejercicio regular para mantenerse en forma. Esto lo ayudar? a mantener el equilibrio. Consulte al  m?dico qu? tipos de ejercicios son adecuados para usted. ?Si necesita un bast?n o un andador, ?selo seg?n las recomendaciones del m?dico. ?Utilice calzado con buen apoyo y suela antideslizante. ?Seguridad ? ?Retire los objetos que puedan causar tropiezos tales como alfombras, cables u obst?culos. ?Instale equipos de seguridad, como barras para sost?n en los ba?os y barandas de seguridad en las escaleras. ?Mantenga las habitaciones y los pasillos bien iluminados. ?Indicaciones generales ?Hable con el m?dico sobre sus riesgos de sufrir una ca?da. Inf?rmele a su m?dico si: ?Se cae. Aseg?rese de informarle a su m?dico acerca de todas las ca?das, incluso aquellas que parecen ser menores. ?Se siente mareado, cansado (tiene fatiga) o siente que pierde el equilibrio. ?Use los medicamentos de venta libre y los recetados solamente como se lo haya indicado el m?dico. Estos incluyen suplementos. ?Siga una dieta sana y mantenga un peso saludable. Una dieta saludable incluye productos l?cteos descremados, carnes bajas en contenido de grasa (magras), fibra de granos enteros, frijoles y muchas frutas y verduras. ?Mant?ngase al d?a con las vacunas. ?Real?cese los estudios de rutina de la salud, dentales y de la vista. ?Resumen ?Tener un estilo de vida saludable y recibir cuidados preventivos pueden ayudar a promover la salud y el bienestar despu?s de los 65 a?os de edad. ?Realizarse pruebas de detecci?n y an?lisis es la mejor manera de detectar un problema de salud de forma temprana y ayudarlo a evitar una ca?da. El diagn?stico y tratamiento tempranos le brindan la mejor oportunidad de controlar las afecciones m?dicas m?s comunes en las personas mayores de 65 a?os de edad. ?Las ca?das son la causa principal de las fracturas de huesos y lesiones en la cabeza de personas mayores de 65 a?os de edad. Tome precauciones para evitar una ca?da en su casa. ?Trabaje con el m?dico para saber qu? cambios que puede hacer para mejorar su salud y  bienestar, y para prevenir las ca?das. ?Esta informaci?n no tiene como fin reemplazar el consejo del m?dico. Aseg?rese de hacerle al m?dico cualquier pregunta que tenga. ?Document Revised: 07/13/2020 Document Reviewed: 07/13/2020 ?Elsevier Patient Education ? 2022 Elsevier Inc. ? ?

## 2021-03-07 NOTE — Assessment & Plan Note (Signed)
Well-controlled hypertension. BP Readings from Last 3 Encounters:  03/07/21 120/70  11/30/20 116/60  10/19/20 104/60  Continue valsartan 320 mg daily, amlodipine 5 mg daily and carvedilol 12.5 mg twice a day.

## 2021-03-07 NOTE — Assessment & Plan Note (Signed)
Stable.  Diet and nutrition discussed. Continue rosuvastatin 40 mg daily. 

## 2021-05-18 ENCOUNTER — Encounter: Payer: Self-pay | Admitting: Cardiovascular Disease

## 2021-05-18 ENCOUNTER — Ambulatory Visit: Payer: Medicare HMO | Admitting: Cardiovascular Disease

## 2021-05-18 VITALS — BP 130/82 | HR 66 | Ht 65.0 in | Wt 180.2 lb

## 2021-05-18 DIAGNOSIS — I5032 Chronic diastolic (congestive) heart failure: Secondary | ICD-10-CM

## 2021-05-18 DIAGNOSIS — I10 Essential (primary) hypertension: Secondary | ICD-10-CM | POA: Diagnosis not present

## 2021-05-18 DIAGNOSIS — I318 Other specified diseases of pericardium: Secondary | ICD-10-CM | POA: Diagnosis not present

## 2021-05-18 DIAGNOSIS — I2510 Atherosclerotic heart disease of native coronary artery without angina pectoris: Secondary | ICD-10-CM | POA: Diagnosis not present

## 2021-05-18 DIAGNOSIS — E782 Mixed hyperlipidemia: Secondary | ICD-10-CM | POA: Diagnosis not present

## 2021-05-18 NOTE — Patient Instructions (Signed)
Medication Instructions:  ?Your physician recommends that you continue on your current medications as directed. Please refer to the Current Medication list given to you today. ? ?*If you need a refill on your cardiac medications before your next appointment, please call your pharmacy* ? ? ?Lab Work: ?NONE ?If you have labs (blood work) drawn today and your tests are completely normal, you will receive your results only by: ?MyChart Message (if you have MyChart) OR ?A paper copy in the mail ?If you have any lab test that is abnormal or we need to change your treatment, we will call you to review the results. ? ? ?Testing/Procedures: ?Chest CT scan ?Non-Cardiac CT scanning, (CAT scanning), is a noninvasive, special x-ray that produces cross-sectional images of the body using x-rays and a computer. CT scans help physicians diagnose and treat medical conditions. For some CT exams, a contrast material is used to enhance visibility in the area of the body being studied. CT scans provide greater clarity and reveal more details than regular x-ray exams. ? ?Follow-Up: ?At [facility], you and your health needs are our priority.  As part of our continuing mission to provide you with exceptional heart care, we have created designated Provider Care Teams.  These Care Teams include your primary Cardiologist (physician) and Advanced Practice Providers (APPs -  Physician Assistants and Nurse Practitioners) who all work together to provide you with the care you need, when you need it. ? ?We recommend signing up for the patient portal called "MyChart".  Sign up information is provided on this After Visit Summary.  MyChart is used to connect with patients for Virtual Visits (Telemedicine).  Patients are able to view lab/test results, encounter notes, upcoming appointments, etc.  Non-urgent messages can be sent to your provider as well.   ?To learn more about what you can do with MyChart, go to [email or web address].   ? ?Your  next appointment:   ?6 month(s) ? ?The format for your next appointment:   ?In Person ? ?Provider:   ?Sherren Mocha, MD   ? ?  ?

## 2021-05-18 NOTE — Progress Notes (Signed)
?Cardiology Office Note:   ? ?Date:  05/18/2021  ? ?ID:  Enoch Moffa, DOB 05-Mar-1940, MRN [id number] ? ?PCP:  Horald Pollen, MD ?  ?[facility] Providers ?Cardiologist:  Sherren Mocha, MD    ? ?Referring MD: Horald Pollen, *  ? ?Chief Complaint  ?Patient presents with  ? Coronary Artery Disease  ? ? ?History of Present Illness:   ? ?Jaymison Luber is a 81 y.o. male with a hx of coronary artery disease status post CABG 2019, presenting follow-up evaluation.  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  A PET scan was ultimately done which demonstrated a hypermetabolic soft tissue thickening along the lateral wall of the left ventricle within the pericardial space.  After a shared decision making conversation, we elected on imaging surveillance. ? ?The patient is here with his wife today.  He is doing fairly well at present.  He denies any symptoms of chest pain, chest pressure, shortness of breath, lightheadedness, or heart palpitations.  He complains of generalized weakness.  He is able to get out of a chair without much difficulty, but he has trouble getting up off of the floor.  He states that he just feels generally weak.  This has been a longstanding issue with some progression over time.  No other specific complaints today.  He is compliant with his medical program.  He has stopped taking furosemide because of the frequent urination.  He denies orthopnea, PND, or other complaints at present.  Again ? ?Past Medical History:  ?Diagnosis Date  ? Blurred vision 05/17/2016  ? CAD (coronary artery disease), native coronary artery 06/14/2008  ? a. BMS to LCx & OM 2008 b. 03/2017 CABG x 4 (LIMA to LAD, SVG to diag 1, SVG to distal Circ, SVG to PDA)  ? Chest pain 04/16/2012  ? Diplopia   ? Echocardiogram   ? Echo 10/19: mod LVH, EF 55-60, no RWMA, Gr 1 DD, trivial AI, MAC, mod LAE, prob  small post effusion  ? Essential hypertension 05/17/2016  ? Hyperlipidemia with target low density lipoprotein (LDL) cholesterol less than 70 mg/dL 06/14/2008  ? Qualifier: Diagnosis of  By: Mare Ferrari, [place], [place]    ? Hypertension   ? Hypertensive heart disease 06/14/2008  ? Qualifier: Diagnosis of  By: Mare Ferrari, [place], [place]    ? Hypertensive urgency, malignant 04/16/2012  ? Hypokalemia 04/14/2017  ? Internal hemorrhoids 04/19/2012  ? Ischemic stroke ([place]) 05/02/2016  ? Non-ST elevation (NSTEMI) myocardial infarction [facility])   ? pandiverticulosis 04/22/2012  ? 12/17/2011. [place]. Juanita Craver MD. Colonoscopy. Moderate sized internal hemorrhoids and extensive pandiverticulosis. Repeat 5 years.    ? Pericardial effusion   ? Echo 11/19: mild focal basal septal hypertrophy, EF 55-60, Gr 2 DD, trivial AI, trivial TR, trivial PI, small to mod eff post to heart - no evidence of RV collapse.>> repeat limited echo in 03/2018 // Echo 03/2018: EF 55-60, mild LVH, +diastolic dysfunction, no RWMA, PASP 23, trivial pericardial effusion   ? Polycythemia vera(238.4) 04/17/2012  ? S/P CABG x 4 04/17/2017  ? Syncope and collapse 04/17/2012  ? Pt syncopized while sitting in bed giving history @ time of admission to hospital Tachycardic (appeared sinus) to 120s-130s and hypotensive to 50s/30s.  Unresponsive initially >> Spontaneously resolved after 2-3 minutes >> return to baseline ~30 minutes   ? Vertigo   ? ? ?Past Surgical History:  ?Procedure Laterality Date  ?  CORONARY ARTERY BYPASS GRAFT N/A 04/17/2017  ? Procedure: CORONARY ARTERY BYPASS GRAFTING (CABG) x4 , using left internal mammary artery  to LAD and right leg greater saphenous vein harvested endoscopically  to PDA, Diagonal I and Circumflex;  Surgeon: [name], MD;  Location: [facility];  Service: Open Heart Surgery;  Laterality: N/A;  ? CORONARY ARTERY BYPASS GRAFT  2020  ? CORONARY STENT PLACEMENT    ? LEFT HEART CATH AND CORONARY ANGIOGRAPHY N/A 04/16/2017  ?  Procedure: LEFT HEART CATH AND CORONARY ANGIOGRAPHY;  Surgeon: Sherren Mocha, MD;  Location: [facility];  Service: Cardiovascular;  Laterality: N/A;  ? TEE WITHOUT CARDIOVERSION N/A 04/17/2017  ? Procedure: TRANSESOPHAGEAL ECHOCARDIOGRAM (TEE);  Surgeon: [name], MD;  Location: [facility];  Service: Open Heart Surgery;  Laterality: N/A;  ? ? ?Current Medications: ?Current Meds  ?Medication Sig  ? acetaminophen (TYLENOL) 325 MG tablet Take 325 mg by mouth daily.  ? amLODipine (NORVASC) 5 MG tablet Take 1 tablet (5 mg total) by mouth daily.  ? aspirin EC 81 MG tablet Take 1 tablet (81 mg total) by mouth daily.  ? carvedilol (COREG) 12.5 MG tablet Take 1 tablet (12.5 mg total) by mouth 2 (two) times daily.  ? pantoprazole (PROTONIX) 40 MG tablet Take 1 tablet (40 mg total) by mouth daily.  ? rosuvastatin (CRESTOR) 40 MG tablet Take 1 tablet (40 mg total) by mouth daily.  ? valsartan (DIOVAN) 320 MG tablet Take 1 tablet (320 mg total) by mouth daily.  ?  ? ?Allergies:   Lipitor [atorvastatin], Morphine and related, and Pork-derived products  ? ?Social History  ? ?Socioeconomic History  ? Marital status: Married  ?  Spouse name: Not on file  ? Number of children: Not on file  ? Years of education: Not on file  ? Highest education level: Not on file  ?Occupational History  ? Not on file  ?Tobacco Use  ? Smoking status: Never  ? Smokeless tobacco: Never  ?Vaping Use  ? Vaping Use: Never used  ?Substance and Sexual Activity  ? Alcohol use: No  ?  Alcohol/week: 0.0 standard drinks  ? Drug use: No  ? Sexual activity: Not on file  ?Other Topics Concern  ? Not on file  ?Social History Narrative  ? Lives in [place] with Wife and 2 sons.  From [place].  To [place] ~2000.    ? Currently retired but worked in Veterinary surgeon  ? ?Social Determinants of Health  ? ?Financial Resource Strain: Low Risk   ? Difficulty of Paying Living Expenses: Not hard at all  ?Food Insecurity: No Food Insecurity  ? Worried About  Charity fundraiser in the Last Year: Never true  ? Ran Out of Food in the Last Year: Never true  ?Transportation Needs: No Transportation Needs  ? Lack of Transportation (Medical): No  ? Lack of Transportation (Non-Medical): No  ?Physical Activity: Inactive  ? Days of Exercise per Week: 0 days  ? Minutes of Exercise per Session: 0 min  ?Stress: No Stress Concern Present  ? Feeling of Stress : Not at all  ?Social Connections: Socially Integrated  ? Frequency of Communication with Friends and Family: More than three times a week  ? Frequency of Social Gatherings with Friends and Family: Three times a week  ? Attends Religious Services: More than 4 times per year  ? Active Member of Clubs or Organizations: No  ? Attends Archivist Meetings: More  than 4 times per year  ? Marital Status: Married  ?  ? ?Family History: ?The patient's family history includes Cancer in his sister; Heart disease in his mother. ? ?ROS:   ?Please see the history of present illness.    ?All other systems reviewed and are negative. ? ?EKGs/Labs/Other Studies Reviewed:   ? ?The following studies were reviewed today: ?CT Chest 11/08/20: ?IMPRESSION: ?Focal thickening and masslike appearance of the LEFT pericardium ?along the LEFT heart border is little changed accounting for slight ?differences in position of the heart over a series of prior studies, ?perhaps enlarged since 2017/04/26. Neoplasm or sequela of chronic ?inflammation are both considered. Refer to prior cardiac MRI for ?better detail. ?  ?No adenopathy in the chest. ?  ?Three-vessel coronary artery disease. Post median sternotomy for ?CABG. ?  ?Lobular hepatic contours with caudate enlargement. Findings may ?reflect cirrhosis/liver disease. ?  ?Small hiatal hernia. ? ?Cardiac MRI 08/30/2020: ?FINDINGS: ?1. Normal left ventricular size, with LVEDD 41 mm, and LVEDVi 61 ?mL/m2. ?  ?Normal left ventricular thickness, with intraventricular septal ?thickness of 9 mm, posterior wall  thickness of 10 mm. ?  ?Mild left ventricular systolic dysfunction (LVEF =49%). There are no ?regional wall motion abnormalities; mild global hypokinesis. ?  ?2. Normal right ventricular size with RVEDVI 46 mL/m2. ?

## 2021-05-30 ENCOUNTER — Ambulatory Visit (INDEPENDENT_AMBULATORY_CARE_PROVIDER_SITE_OTHER)
Admission: RE | Admit: 2021-05-30 | Discharge: 2021-05-30 | Disposition: A | Discharge status: Home / Self Care | Payer: Medicare HMO | Source: Ambulatory Visit | Attending: Cardiovascular Disease | Admitting: Cardiovascular Disease

## 2021-05-30 DIAGNOSIS — I318 Other specified diseases of pericardium: Secondary | ICD-10-CM | POA: Diagnosis not present

## 2021-05-30 DIAGNOSIS — I700 Atherosclerosis of aorta: Secondary | ICD-10-CM | POA: Diagnosis not present

## 2021-07-20 ENCOUNTER — Encounter: Payer: Self-pay | Admitting: Cardiovascular Disease

## 2021-07-20 ENCOUNTER — Ambulatory Visit: Payer: Medicare HMO | Admitting: Cardiovascular Disease

## 2021-07-20 ENCOUNTER — Ambulatory Visit
Admission: RE | Admit: 2021-07-20 | Discharge: 2021-07-20 | Disposition: A | Discharge status: Home / Self Care | Payer: Medicare HMO | Source: Ambulatory Visit | Attending: Cardiovascular Disease | Admitting: Cardiovascular Disease

## 2021-07-20 VITALS — BP 114/70 | HR 65 | Ht 66.0 in | Wt 180.6 lb

## 2021-07-20 DIAGNOSIS — E782 Mixed hyperlipidemia: Secondary | ICD-10-CM

## 2021-07-20 DIAGNOSIS — I2510 Atherosclerotic heart disease of native coronary artery without angina pectoris: Secondary | ICD-10-CM | POA: Diagnosis not present

## 2021-07-20 DIAGNOSIS — I318 Other specified diseases of pericardium: Secondary | ICD-10-CM | POA: Diagnosis not present

## 2021-07-20 DIAGNOSIS — R918 Other nonspecific abnormal finding of lung field: Secondary | ICD-10-CM | POA: Diagnosis not present

## 2021-07-20 DIAGNOSIS — M47814 Spondylosis without myelopathy or radiculopathy, thoracic region: Secondary | ICD-10-CM | POA: Diagnosis not present

## 2021-07-20 DIAGNOSIS — I10 Essential (primary) hypertension: Secondary | ICD-10-CM | POA: Diagnosis not present

## 2021-07-20 DIAGNOSIS — I5032 Chronic diastolic (congestive) heart failure: Secondary | ICD-10-CM | POA: Diagnosis not present

## 2021-07-20 DIAGNOSIS — I700 Atherosclerosis of aorta: Secondary | ICD-10-CM | POA: Diagnosis not present

## 2021-07-20 DIAGNOSIS — I3139 Other pericardial effusion (noninflammatory): Secondary | ICD-10-CM | POA: Diagnosis not present

## 2021-07-20 MED ORDER — VALSARTAN 160 MG PO TABS: 160.0000 mg | ORAL_TABLET | Freq: Every day | ORAL | 3 refills | Status: DC

## 2021-07-20 MED ORDER — AMLODIPINE BESYLATE 2.5 MG PO TABS: 2.5000 mg | ORAL_TABLET | Freq: Every day | ORAL | 3 refills | Status: DC

## 2021-07-20 NOTE — Patient Instructions (Signed)
Medication Instructions:  DECREASE Valsartan to '160mg'$  daily DECREASE Amlodipine to 2.'5mg'$  daily *If you need a refill on your cardiac medications before your next appointment, please call your pharmacy*   Lab Work: CBC, CMET TODAY If you have labs (blood work) drawn today and your tests are completely normal, you will receive your results only by: [place] (if you have MyChart) OR A paper copy in the mail If you have any lab test that is abnormal or we need to change your treatment, we will call you to review the results.   Testing/Procedures: Chest X-ray A chest x-ray takes a picture of the organs and structures inside the chest, including the heart, lungs, and blood vessels. This test can show several things, including, whether the heart is enlarges; whether fluid is building up in the lungs; and whether pacemaker / defibrillator leads are still in place.  ECHO Your physician has requested that you have an echocardiogram. Echocardiography is a painless test that uses sound waves to create images of your heart. It provides your doctor with information about the size and shape of your heart and how well your heart's chambers and valves are working. This procedure takes approximately one hour. There are no restrictions for this procedure.  Follow-Up: At [facility], you and your health needs are our priority.  As part of our continuing mission to provide you with exceptional heart care, we have created designated Provider Care Teams.  These Care Teams include your primary Cardiologist (physician) and Advanced Practice Providers (APPs -  Physician Assistants and Nurse Practitioners) who all work together to provide you with the care you need, when you need it.  We recommend signing up for the patient portal called "MyChart".  Sign up information is provided on this After Visit Summary.  MyChart is used to connect with patients for Virtual Visits (Telemedicine).  Patients are able to  view lab/test results, encounter notes, upcoming appointments, etc.  Non-urgent messages can be sent to your provider as well.   To learn more about what you can do with MyChart, go to [email or web address].    Your next appointment:   3 month(s)  The format for your next appointment:   In Person  Provider:   Robbie Lis, PA-C, Christen Bame, NP, or Richardson Dopp, PA-C     Then, Sherren Mocha, MD will plan to see you again in 6 month(s).{  Important Information About Sugar

## 2021-07-20 NOTE — Progress Notes (Signed)
Cardiology Office Note:    Date:  07/20/2021   ID:  [name], DOB September 09, 1940, MRN [id number]  PCP:  Horald Pollen, MD   [place] Providers Cardiologist:  Sherren Mocha, MD     Referring MD: Horald Pollen, *   Chief Complaint  Patient presents with   Shortness of Breath    History of Present Illness:    [name] is a 81 y.o. male with a hx of coronary artery disease status post CABG 2019, presenting follow-up evaluation.  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  A PET scan was ultimately done which demonstrated a hypermetabolic soft tissue thickening along the lateral wall of the left ventricle within the pericardial space.  After a shared decision making conversation, we elected on imaging surveillance.  The patient is here with his wife today.  He has not been feeling well.  He has had 2 recent falls, both of which have they were mechanical falls related to leg weakness.  He does not admit to frank syncope.  He complains of progressive shortness of breath but no orthopnea or PND.  States that he just does not feel well and has no energy.  He has not had any recent chest pain or pressure.  No heart palpitations or leg swelling.   Past Medical History:  Diagnosis Date   Blurred vision 05/17/2016   CAD (coronary artery disease), native coronary artery 06/14/2008   a. BMS to LCx & OM 2008 b. 03/2017 CABG x 4 (LIMA to LAD, SVG to diag 1, SVG to distal Circ, SVG to PDA)   Chest pain 04/16/2012   Diplopia    Echocardiogram    Echo 10/19: mod LVH, EF 55-60, no RWMA, Gr 1 DD, trivial AI, MAC, mod LAE, prob small post effusion   Essential hypertension 05/17/2016   Hyperlipidemia with target low density lipoprotein (LDL) cholesterol less than 70 mg/dL 06/14/2008   Qualifier: Diagnosis of  By: Mare Ferrari, RMA, [name]     Hypertension     Hypertensive heart disease 06/14/2008   Qualifier: Diagnosis of  By: Mare Ferrari, RMA, [name]     Hypertensive urgency, malignant 04/16/2012   Hypokalemia 04/14/2017   Internal hemorrhoids 04/19/2012   Ischemic stroke ([place]) 05/02/2016   Non-ST elevation (NSTEMI) myocardial infarction [facility])    pandiverticulosis 04/22/2012   12/17/2011. [place]. Juanita Craver MD. Colonoscopy. Moderate sized internal hemorrhoids and extensive pandiverticulosis. Repeat 5 years.     Pericardial effusion    Echo 11/19: mild focal basal septal hypertrophy, EF 55-60, Gr 2 DD, trivial AI, trivial TR, trivial PI, small to mod eff post to heart - no evidence of RV collapse.>> repeat limited echo in 03/2018 // Echo 03/2018: EF 55-60, mild LVH, +diastolic dysfunction, no RWMA, PASP 23, trivial pericardial effusion    Polycythemia vera(238.4) 04/17/2012   S/P CABG x 4 04/17/2017   Syncope and collapse 04/17/2012   Pt syncopized while sitting in bed giving history @ time of admission to hospital Tachycardic (appeared sinus) to 120s-130s and hypotensive to 50s/30s.  Unresponsive initially >> Spontaneously resolved after 2-3 minutes >> return to baseline ~30 minutes    Vertigo     Past Surgical History:  Procedure Laterality Date   CORONARY ARTERY BYPASS GRAFT N/A 04/17/2017   Procedure: CORONARY ARTERY BYPASS GRAFTING (CABG) x4 , using left internal mammary artery  to LAD and right leg greater saphenous vein harvested endoscopically  to PDA, Diagonal I and Circumflex;  Surgeon: [name], MD;  Location: [facility];  Service: Open Heart Surgery;  Laterality: N/A;   CORONARY ARTERY BYPASS GRAFT  2020   CORONARY STENT PLACEMENT     LEFT HEART CATH AND CORONARY ANGIOGRAPHY N/A 04/16/2017   Procedure: LEFT HEART CATH AND CORONARY ANGIOGRAPHY;  Surgeon: Sherren Mocha, MD;  Location: [facility];  Service: Cardiovascular;  Laterality: N/A;   TEE WITHOUT CARDIOVERSION N/A 04/17/2017   Procedure: TRANSESOPHAGEAL  ECHOCARDIOGRAM (TEE);  Surgeon: [name], MD;  Location: [facility];  Service: Open Heart Surgery;  Laterality: N/A;    Current Medications: Current Meds  Medication Sig   acetaminophen (TYLENOL) 325 MG tablet Take 325 mg by mouth daily.   amLODipine (NORVASC) 2.5 MG tablet Take 1 tablet (2.5 mg total) by mouth daily.   carvedilol (COREG) 12.5 MG tablet Take 1 tablet (12.5 mg total) by mouth 2 (two) times daily.   pantoprazole (PROTONIX) 40 MG tablet Take 1 tablet (40 mg total) by mouth daily.   rosuvastatin (CRESTOR) 40 MG tablet Take 1 tablet (40 mg total) by mouth daily.   valsartan (DIOVAN) 160 MG tablet Take 1 tablet (160 mg total) by mouth daily.   [DISCONTINUED] amLODipine (NORVASC) 5 MG tablet Take 1 tablet (5 mg total) by mouth daily.   [DISCONTINUED] valsartan (DIOVAN) 320 MG tablet Take 1 tablet (320 mg total) by mouth daily.     Allergies:   Lipitor [atorvastatin], Morphine and related, and Pork-derived products   Social History   Socioeconomic History   Marital status: Married    Spouse name: Not on file   Number of children: Not on file   Years of education: Not on file   Highest education level: Not on file  Occupational History   Not on file  Tobacco Use   Smoking status: Never   Smokeless tobacco: Never  Vaping Use   Vaping Use: Never used  Substance and Sexual Activity   Alcohol use: No    Alcohol/week: 0.0 standard drinks   Drug use: No   Sexual activity: Not on file  Other Topics Concern   Not on file  Social History Narrative   Lives in [place] with Wife and 2 sons.  From [place].  To [place] ~2000.     Currently retired but worked in [place] Strain: Low Risk    Difficulty of Paying Living Expenses: Not hard at all  Food Insecurity: No Food Insecurity   Worried About Charity fundraiser in the Last Year: Never true   Arboriculturist in the Last Year: Never true   Transportation Needs: No Transportation Needs   Lack of Transportation (Medical): No   Lack of Transportation (Non-Medical): No  Physical Activity: Inactive   Days of Exercise per Week: 0 days   Minutes of Exercise per Session: 0 min  Stress: No Stress Concern Present   Feeling of Stress : Not at all  Social Connections: Socially Integrated   Frequency of Communication with Friends and Family: More than three times a week   Frequency of Social Gatherings with Friends and Family: Three times a week   Attends Religious Services: More than 4 times per year   Active Member of Clubs or Organizations: No   Attends Music therapist: More than 4 times per year   Marital Status: Married     Family  History: The patient's family history includes Cancer in his sister; Heart disease in his mother.  ROS:   Please see the history of present illness.    All other systems reviewed and are negative.  EKGs/Labs/Other Studies Reviewed:    The following studies were reviewed today: CT Chest: IMPRESSION: 1. Unchanged, masslike thickening of the pericardium overlying the lateral wall of the left ventricle. This was in general better characterized by prior cardiac MR. 2. Small hiatal hernia. 3. Coronary artery disease.  Echo 08/09/2020:  1. Left ventricular ejection fraction, by estimation, is 60 to 65%. The  left ventricle has normal function. The left ventricle has no regional  wall motion abnormalities. There is mild left ventricular hypertrophy.  Left ventricular diastolic parameters  are consistent with Grade II diastolic dysfunction (pseudonormalization).  Elevated left atrial pressure.   2. Right ventricular systolic function is normal. The right ventricular  size is normal.   3. Left atrial size was moderately dilated.   4. The mitral valve is normal in structure. Trivial mitral valve  regurgitation. No evidence of mitral stenosis.   5. The aortic valve is tricuspid.  Aortic valve regurgitation is trivial.  Mild aortic valve sclerosis is present, with no evidence of aortic valve  stenosis.   6. The inferior vena cava is normal in size with greater than 50%  respiratory variability, suggesting right atrial pressure of 3 mmHg.   EKG:  EKG is ordered today.  The ekg ordered today demonstrates normal sinus rhythm 65 bpm, T wave abnormality consider inferolateral ischemia  Recent Labs: 09/10/2020: Hemoglobin 13.9; Platelets 206 03/07/2021: ALT 14; BUN 14; Creatinine, Ser 0.80; Potassium 4.0; Sodium 138  Recent Lipid Panel    Component Value Date/Time   CHOL 111 03/07/2021 1145   CHOL 105 08/14/2019 0956   TRIG 102.0 03/07/2021 1145   HDL 39.10 03/07/2021 1145   HDL 37 (L) 08/14/2019 0956   CHOLHDL 3 03/07/2021 1145   VLDL 20.4 03/07/2021 1145   LDLCALC 51 03/07/2021 1145   LDLCALC 50 08/14/2019 0956   LDLDIRECT 150.8 06/30/2008 1007     Risk Assessment/Calculations:           Physical Exam:    VS:  BP 114/70   Pulse 65   Ht _0  (1.676 m)   Wt 180 lb 9.6 oz (81.9 kg)   SpO2 96%   BMI 29.15 kg/m     Wt Readings from Last 3 Encounters:  07/20/21 180 lb 9.6 oz (81.9 kg)  05/18/21 180 lb 3.2 oz (81.7 kg)  03/07/21 174 lb (78.9 kg)     GEN:  Well nourished, well developed in no acute distress HEENT: Normal NECK: No JVD; No carotid bruits LYMPHATICS: No lymphadenopathy CARDIAC: RRR, no murmurs, rubs, gallops RESPIRATORY:  Clear to auscultation without rales, wheezing or rhonchi  ABDOMEN: Soft, non-tender, non-distended MUSCULOSKELETAL:  No edema; No deformity  SKIN: Warm and dry NEUROLOGIC:  Alert and oriented x 3 PSYCHIATRIC:  Normal affect   ASSESSMENT:    1. Essential hypertension   2. Pericardial mass   3. Chronic diastolic heart failure ([place])   4. Coronary artery disease involving native coronary artery of native heart without angina pectoris   5. Mixed hyperlipidemia    PLAN:    In order of problems listed  above:  Blood pressure may be overtreated.  The patient's primary complaint is weakness and fatigue.  I am going to reduce his valsartan to 160 mg daily and amlodipine to 2.5 mg  daily.  He will continue on his current dose of carvedilol. Follow-up CT has been completed and as outlined above. Check 2D echocardiogram to update LV function and any other abnormalities that might be contributing to the patient's progressive symptoms.  Medication changes as outlined above.  Not requiring any diuretic therapy at present.  No signs or symptoms of fluid overload on exam. No anginal symptoms noted.  Continue beta-blocker and statin drug. Treated with rosuvastatin 40 mg daily.  Cholesterol is 111, LDL 51 With the patient's progressive weakness and shortness of breath, I am going to check an echo and chest x-ray.  I would like him to have a CBC and metabolic panel today as well.     Medication Adjustments/Labs and Tests Ordered: Current medicines are reviewed at length with the patient today.  Concerns regarding medicines are outlined above.  Orders Placed This Encounter  Procedures   DG Chest 2 View   CBC   Comprehensive metabolic panel   EKG [id number]   ECHOCARDIOGRAM COMPLETE   Meds ordered this encounter  Medications   valsartan (DIOVAN) 160 MG tablet    Sig: Take 1 tablet (160 mg total) by mouth daily.    Dispense:  90 tablet    Refill:  3    Dose decrease   amLODipine (NORVASC) 2.5 MG tablet    Sig: Take 1 tablet (2.5 mg total) by mouth daily.    Dispense:  90 tablet    Refill:  3    Dose decrease    Patient Instructions  Medication Instructions:  DECREASE Valsartan to 140m daily DECREASE Amlodipine to 2.573mdaily *If you need a refill on your cardiac medications before your next appointment, please call your pharmacy*   Lab Work: CBC, CMET TODAY If you have labs (blood work) drawn today and your tests are completely normal, you will receive your results only by: [facility](if you have MyChart) OR A paper copy in the mail If you have any lab test that is abnormal or we need to change your treatment, we will call you to review the results.   Testing/Procedures: Chest X-ray A chest x-ray takes a picture of the organs and structures inside the chest, including the heart, lungs, and blood vessels. This test can show several things, including, whether the heart is enlarges; whether fluid is building up in the lungs; and whether pacemaker / defibrillator leads are still in place.  ECHO Your physician has requested that you have an echocardiogram. Echocardiography is a painless test that uses sound waves to create images of your heart. It provides your doctor with information about the size and shape of your heart and how well your heart's chambers and valves are working. This procedure takes approximately one hour. There are no restrictions for this procedure.  Follow-Up: [facility] and your health needs are our priority.  As part of our continuing mission to provide you with exceptional heart care, we have created designated Provider Care Teams.  These Care Teams include your primary Cardiologist (physician) and Advanced Practice Providers (APPs -  Physician Assistants and Nurse Practitioners) who all work together to provide you with the care you need, when you need it.  We recommend signing up for the patient portal called "MyChart".  Sign up information is provided on this After Visit Summary.  MyChart is used to connect with patients for Virtual Visits (Telemedicine).  Patients are able to view lab/test results, encounter notes, upcoming appointments, etc.  Non-urgent  messages can be sent to your provider as well.   To learn more about what you can do with MyChart, go to [email or web address].    Your next appointment:   3 month(s)  The format for your next appointment:   In Person  Provider:   Robbie Lis, PA-C, Christen Bame, NP, or Richardson Dopp, PA-C     Then, Sherren Mocha, MD will plan to see you again in 6 month(s).{  Important Information About Sugar         Signed, Sherren Mocha, MD  07/20/2021 4:57 PM    [name]

## 2021-07-21 LAB — CBC
Hematocrit: 42.8 % (ref 37.5–51.0)
Hemoglobin: 14.1 g/dL (ref 13.0–17.7)
MCH: 29.2 pg (ref 26.6–33.0)
MCHC: 32.9 g/dL (ref 31.5–35.7)
MCV: 89 fL (ref 79–97)
Platelets: 244 10*3/uL (ref 150–450)
RBC: 4.83 x10E6/uL (ref 4.14–5.80)
RDW: 14.3 % (ref 11.6–15.4)
WBC: 8.9 10*3/uL (ref 3.4–10.8)

## 2021-07-21 LAB — COMPREHENSIVE METABOLIC PANEL
ALT: 21 IU/L (ref 0–44)
AST: 23 IU/L (ref 0–40)
Albumin/Globulin Ratio: 1.4 (ref 1.2–2.2)
Albumin: 4.2 g/dL (ref 3.7–4.7)
Alkaline Phosphatase: 97 IU/L (ref 44–121)
BUN/Creatinine Ratio: 18 (ref 10–24)
BUN: 17 mg/dL (ref 8–27)
Bilirubin Total: 0.4 mg/dL (ref 0.0–1.2)
CO2: 24 mmol/L (ref 20–29)
Calcium: 9.6 mg/dL (ref 8.6–10.2)
Chloride: 103 mmol/L (ref 96–106)
Creatinine, Ser: 0.97 mg/dL (ref 0.76–1.27)
Globulin, Total: 3 g/dL (ref 1.5–4.5)
Glucose: 81 mg/dL (ref 70–99)
Potassium: 4.7 mmol/L (ref 3.5–5.2)
Sodium: 142 mmol/L (ref 134–144)
Total Protein: 7.2 g/dL (ref 6.0–8.5)
eGFR: 79 mL/min/{1.73_m2} (ref >59)

## 2021-08-09 ENCOUNTER — Ambulatory Visit (HOSPITAL_COMMUNITY): Payer: Medicare HMO | Attending: Cardiology

## 2021-08-09 DIAGNOSIS — I2510 Atherosclerotic heart disease of native coronary artery without angina pectoris: Secondary | ICD-10-CM | POA: Diagnosis not present

## 2021-08-09 DIAGNOSIS — I5032 Chronic diastolic (congestive) heart failure: Secondary | ICD-10-CM | POA: Diagnosis not present

## 2021-08-09 DIAGNOSIS — I318 Other specified diseases of pericardium: Secondary | ICD-10-CM | POA: Insufficient documentation

## 2021-08-09 LAB — ECHOCARDIOGRAM COMPLETE
Area-P 1/2: 3.13 cm2
S' Lateral: 2.3 cm

## 2021-09-06 ENCOUNTER — Encounter: Payer: Self-pay | Admitting: Emergency Medicine

## 2021-09-06 ENCOUNTER — Encounter: Payer: Self-pay | Admitting: Neurology

## 2021-09-06 ENCOUNTER — Ambulatory Visit (INDEPENDENT_AMBULATORY_CARE_PROVIDER_SITE_OTHER): Payer: Medicare HMO | Admitting: Emergency Medicine

## 2021-09-06 VITALS — BP 132/82 | HR 68 | Temp 98.6°F | Ht 66.0 in | Wt 179.0 lb

## 2021-09-06 DIAGNOSIS — E785 Hyperlipidemia, unspecified: Secondary | ICD-10-CM | POA: Diagnosis not present

## 2021-09-06 DIAGNOSIS — I25118 Atherosclerotic heart disease of native coronary artery with other forms of angina pectoris: Secondary | ICD-10-CM

## 2021-09-06 DIAGNOSIS — I700 Atherosclerosis of aorta: Secondary | ICD-10-CM | POA: Diagnosis not present

## 2021-09-06 DIAGNOSIS — Z8673 Personal history of transient ischemic attack (TIA), and cerebral infarction without residual deficits: Secondary | ICD-10-CM | POA: Diagnosis not present

## 2021-09-06 DIAGNOSIS — R269 Unspecified abnormalities of gait and mobility: Secondary | ICD-10-CM

## 2021-09-06 DIAGNOSIS — I110 Hypertensive heart disease with heart failure: Secondary | ICD-10-CM

## 2021-09-06 DIAGNOSIS — I5032 Chronic diastolic (congestive) heart failure: Secondary | ICD-10-CM | POA: Diagnosis not present

## 2021-09-06 NOTE — Assessment & Plan Note (Signed)
No recent anginal episodes. Continue beta-blocker carvedilol 12.5 mg twice a day

## 2021-09-06 NOTE — Assessment & Plan Note (Signed)
Slow walking with a cane and shuffling his feet.  No other parkinsonian features. Good muscle strength and preserved DTRs. Needs neurology evaluation. Referral placed today.

## 2021-09-06 NOTE — Assessment & Plan Note (Signed)
Well-controlled hypertension. BP Readings from Last 3 Encounters:  09/06/21 132/82  07/20/21 114/70  05/18/21 130/82  Continue valsartan 160 mg and amlodipine 2.5 mg daily Continue carvedilol 12.5 mg twice a day. No signs or symptoms of fluid overload on exam. No anginal symptoms noted.  Continue beta-blocker and rosuvastatin 40 mg daily

## 2021-09-06 NOTE — Assessment & Plan Note (Signed)
Stable.  Continue rosuvastatin 40 mg daily.

## 2021-09-06 NOTE — Assessment & Plan Note (Signed)
Stable.  Diet and nutrition discussed. Continue rosuvastatin 40 mg daily. 

## 2021-09-06 NOTE — Patient Instructions (Signed)
Mantenimiento de la salud despus de los 65 aos de edad Health Maintenance After Age 79 Despus de los 65 aos de edad, corre un riesgo mayor de padecer ciertas enfermedades e infecciones a largo plazo, como tambin de sufrir lesiones por cadas. Las cadas son la causa principal de las fracturas de huesos y lesiones en la cabeza de personas mayores de 65 aos de edad. Recibir cuidados preventivos de forma regular puede ayudarlo a mantenerse saludable y en buen estado. Los cuidados preventivos incluyen realizarse anlisis de forma regular y realizar cambios en el estilo de vida segn las recomendaciones del mdico. Converse con el mdico sobre lo siguiente: Las pruebas de deteccin y los anlisis que debe realizarse. Una prueba de deteccin es un estudio que se para detectar la presencia de una enfermedad cuando no tiene sntomas. Un plan de dieta y ejercicios adecuado para usted. Qu debo saber sobre las pruebas de deteccin y los anlisis para prevenir cadas? Realizarse pruebas de deteccin y anlisis es la mejor manera de detectar un problema de salud de forma temprana. El diagnstico y tratamiento tempranos le brindan la mejor oportunidad de controlar las afecciones mdicas que son comunes despus de los 65 aos de edad. Ciertas afecciones y elecciones de estilo de vida pueden hacer que sea ms propenso a sufrir una cada. El mdico puede recomendarle lo siguiente: Controles regulares de la visin. Una visin deficiente y afecciones como las cataratas pueden hacer que sea ms propenso a sufrir una cada. Si usa lentes, asegrese de obtener una receta actualizada si su visin cambia. Revisin de medicamentos. Revise regularmente con el mdico todos los medicamentos que toma, incluidos los medicamentos de venta libre. Consulte al mdico sobre los efectos secundarios que pueden hacer que sea ms propenso a sufrir una cada. Informe al mdico si alguno de los medicamentos que toma lo hace sentir mareado o  somnoliento. Controles de fuerza y equilibrio. El mdico puede recomendar ciertos estudios para controlar su fuerza y equilibrio al estar de pie, al caminar o al cambiar de posicin. Examen de los pies. El dolor y el adormecimiento en los pies, como tambin no utilizar el calzado adecuado, pueden hacer que sea ms propenso a sufrir una cada. Pruebas de deteccin, que incluyen las siguientes: Pruebas de deteccin para la osteoporosis. La osteoporosis es una afeccin que hace que los huesos se tornen ms dbiles y se quiebren con ms facilidad. Pruebas de deteccin para la presin arterial. Los cambios en la presin arterial y los medicamentos para controlar la presin arterial pueden hacerlo sentir mareado. Prueba de deteccin de la depresin. Es ms probable que sufra una cada si tiene miedo a caerse, se siente deprimido o se siente incapaz de realizar actividades que sola hacer. Prueba de deteccin de consumo de alcohol. Beber demasiado alcohol puede afectar su equilibrio y puede hacer que sea ms propenso a sufrir una cada. Siga estas indicaciones en su casa: Estilo de vida No beba alcohol si: Su mdico le indica no hacerlo. Si bebe alcohol: Limite la cantidad que bebe a lo siguiente: De 0 a 1 medida por da para las mujeres. De 0 a 2 medidas por da para los hombres. Sepa cunta cantidad de alcohol hay en las bebidas que toma. En los [place], una medida equivale a una botella de cerveza de 12 oz (355 ml), un vaso de vino de 5 oz (148 ml) o un vaso de una bebida alcohlica de alta graduacin de 1 oz (44 ml). No consuma ningn producto que   contenga nicotina o tabaco. Estos productos incluyen cigarrillos, tabaco para mascar y aparatos de vapeo, como los cigarrillos electrnicos. Si necesita ayuda para dejar de consumir estos productos, consulte al mdico. Actividad  Siga un programa de ejercicio regular para mantenerse en forma. Esto lo ayudar a mantener el equilibrio. Consulte al  mdico qu tipos de ejercicios son adecuados para usted. Si necesita un bastn o un andador, selo segn las recomendaciones del mdico. Utilice calzado con buen apoyo y suela antideslizante. Seguridad  Retire los objetos que puedan causar tropiezos tales como alfombras, cables u obstculos. Instale equipos de seguridad, como barras para sostn en los baos y barandas de seguridad en las escaleras. Mantenga las habitaciones y los pasillos bien iluminados. Indicaciones generales Hable con el mdico sobre sus riesgos de sufrir una cada. Infrmele a su mdico si: Se cae. Asegrese de informarle a su mdico acerca de todas las cadas, incluso aquellas que parecen ser menores. Se siente mareado, cansado (tiene fatiga) o siente que pierde el equilibrio. Use los medicamentos de venta libre y los recetados solamente como se lo haya indicado el mdico. Estos incluyen suplementos. Siga una dieta sana y mantenga un peso saludable. Una dieta saludable incluye productos lcteos descremados, carnes bajas en contenido de grasa (magras), fibra de granos enteros, frijoles y muchas frutas y verduras. Mantngase al da con las vacunas. Realcese los estudios de rutina de la salud, dentales y de la vista. Resumen Tener un estilo de vida saludable y recibir cuidados preventivos pueden ayudar a promover la salud y el bienestar despus de los 65 aos de edad. Realizarse pruebas de deteccin y anlisis es la mejor manera de detectar un problema de salud de forma temprana y ayudarlo a evitar una cada. El diagnstico y tratamiento tempranos le brindan la mejor oportunidad de controlar las afecciones mdicas ms comunes en las personas mayores de 65 aos de edad. Las cadas son la causa principal de las fracturas de huesos y lesiones en la cabeza de personas mayores de 65 aos de edad. Tome precauciones para evitar una cada en su casa. Trabaje con el mdico para saber qu cambios que puede hacer para mejorar su salud y  bienestar, y para prevenir las cadas. Esta informacin no tiene como fin reemplazar el consejo del mdico. Asegrese de hacerle al mdico cualquier pregunta que tenga. Document Revised: 07/13/2020 Document Reviewed: 07/13/2020 Elsevier Patient Education  2023 [employer].  

## 2021-09-06 NOTE — Progress Notes (Signed)
[name] 81 y.o.   Chief Complaint  Patient presents with   64mofollow up    More weak than before and can hardly walk. Low back pain has gotten better    HISTORY OF PRESENT ILLNESS: This is a 81y.o. male complaining of bilateral leg weakness and gait problems.  Walks with a cane. However he was able to mow the lawn 2 weeks ago without problems. Occasional low back pain which is better today. Was referred to neurology last year for something similar but appointment was never scheduled. States he is mostly "shuffling my feet". No other associated symptoms. No other complaints or medical concerns today.  HPI   Prior to Admission medications   Medication Sig Start Date End Date Taking? Authorizing Provider  acetaminophen (TYLENOL) 325 MG tablet Take 325 mg by mouth daily.   Yes [provider]  amLODipine (NORVASC) 2.5 MG tablet Take 1 tablet (2.5 mg total) by mouth daily. 07/20/21  Yes CSherren Mocha MD  carvedilol (COREG) 12.5 MG tablet Take 1 tablet (12.5 mg total) by mouth 2 (two) times daily. 03/07/21  Yes Paisely Brick, MInes Bloomer MD  pantoprazole (PROTONIX) 40 MG tablet Take 1 tablet (40 mg total) by mouth daily. 03/07/21  Yes Kaeley Vinje, MInes Bloomer MD  rosuvastatin (CRESTOR) 40 MG tablet Take 1 tablet (40 mg total) by mouth daily. 03/07/21  Yes Inaaya Vellucci, MInes Bloomer MD  valsartan (DIOVAN) 160 MG tablet Take 1 tablet (160 mg total) by mouth daily. 07/20/21  Yes CSherren Mocha MD    Allergies  Allergen Reactions   Lipitor [Atorvastatin] Shortness Of Breath and Other (See Comments)    Sores non head   Morphine And Related Shortness Of Breath, Swelling and Rash    Throat swelling   Pork-Derived Products Swelling and Rash    Tongue swelling No pork products -     Patient Active Problem List   Diagnosis Date Noted   History of stroke 03/07/2021   Chronic low back pain 08/24/2020   Angina of effort ([facility] 08/08/2020   Type 2 diabetes mellitus with other  specified complication, without long-term current use of insulin ([facility] 05/31/2020   Aortic atherosclerosis ([facility] 05/31/2020   Chronic diastolic heart failure ([facility] 02/14/2018   Non-ST elevation (NSTEMI) myocardial infarction ([facility]    Essential hypertension 05/17/2016   Ischemic stroke ([facility] 05/02/2016   pandiverticulosis 04/22/2012   Internal hemorrhoids 04/19/2012   Dyslipidemia 06/14/2008   Hypertensive heart disease 06/14/2008   Coronary artery disease of native heart with stable angina pectoris, unspecified vessel or lesion type ([facility] 06/14/2008    Past Medical History:  Diagnosis Date   Blurred vision 05/17/2016   CAD (coronary artery disease), native coronary artery 06/14/2008   a. BMS to LCx & OM 2008 b. 03/2017 CABG x 4 (LIMA to LAD, SVG to diag 1, SVG to distal Circ, SVG to PDA)   Chest pain 04/16/2012   Diplopia    Echocardiogram    Echo 10/19: mod LVH, EF 55-60, no RWMA, Gr 1 DD, trivial AI, MAC, mod LAE, prob small post effusion   Essential hypertension 05/17/2016   Hyperlipidemia with target low density lipoprotein (LDL) cholesterol less than 70 mg/dL 06/14/2008   Qualifier: Diagnosis of  By: FMare Ferrari RMA, [name]     Hypertension    Hypertensive heart disease 06/14/2008   Qualifier: Diagnosis of  By: FMare Ferrari RMA, [name]     Hypertensive urgency, malignant 04/16/2012   Hypokalemia 04/14/2017   Internal hemorrhoids 04/19/2012   Ischemic  stroke ([place]) 05/02/2016   Non-ST elevation (NSTEMI) myocardial infarction [facility])    pandiverticulosis 04/22/2012   12/17/2011. [place]. Juanita Craver MD. Colonoscopy. Moderate sized internal hemorrhoids and extensive pandiverticulosis. Repeat 5 years.     Pericardial effusion    Echo 11/19: mild focal basal septal hypertrophy, EF 55-60, Gr 2 DD, trivial AI, trivial TR, trivial PI, small to mod eff post to heart - no evidence of RV collapse.>> repeat limited echo in 03/2018 // Echo 03/2018: EF 55-60, mild LVH, +diastolic dysfunction, no  RWMA, PASP 23, trivial pericardial effusion    Polycythemia vera(238.4) 04/17/2012   S/P CABG x 4 04/17/2017   Syncope and collapse 04/17/2012   Pt syncopized while sitting in bed giving history @ time of admission to hospital Tachycardic (appeared sinus) to 120s-130s and hypotensive to 50s/30s.  Unresponsive initially >> Spontaneously resolved after 2-3 minutes >> return to baseline ~30 minutes    Vertigo     Past Surgical History:  Procedure Laterality Date   CORONARY ARTERY BYPASS GRAFT N/A 04/17/2017   Procedure: CORONARY ARTERY BYPASS GRAFTING (CABG) x4 , using left internal mammary artery  to LAD and right leg greater saphenous vein harvested endoscopically  to PDA, Diagonal I and Circumflex;  Surgeon: [name], MD;  Location: [place];  Service: Open Heart Surgery;  Laterality: N/A;   CORONARY ARTERY BYPASS GRAFT  2020   CORONARY STENT PLACEMENT     LEFT HEART CATH AND CORONARY ANGIOGRAPHY N/A 04/16/2017   Procedure: LEFT HEART CATH AND CORONARY ANGIOGRAPHY;  Surgeon: Sherren Mocha, MD;  Location: [place] [facility];  Service: Cardiovascular;  Laterality: N/A;   TEE WITHOUT CARDIOVERSION N/A 04/17/2017   Procedure: TRANSESOPHAGEAL ECHOCARDIOGRAM (TEE);  Surgeon: [name], MD;  Location: [place];  Service: Open Heart Surgery;  Laterality: N/A;    Social History   Socioeconomic History   Marital status: Married    Spouse name: Not on file   Number of children: Not on file   Years of education: Not on file   Highest education level: Not on file  Occupational History   Not on file  Tobacco Use   Smoking status: Never   Smokeless tobacco: Never  Vaping Use   Vaping Use: Never used  Substance and Sexual Activity   Alcohol use: No    Alcohol/week: 0.0 standard drinks of alcohol   Drug use: No   Sexual activity: Not on file  Other Topics Concern   Not on file  Social History Narrative   Lives in [place] with Wife and 2 sons.  From [place].  To [place] ~2000.      Currently retired but worked in [place] Strain: [place]  (08/18/2020)   Overall Financial Resource Strain (CARDIA)    Difficulty of Paying Living Expenses: Not hard at all  Food Insecurity: No Food Insecurity (08/18/2020)   Hunger Vital Sign    Worried About Running Out of Food in the Last Year: Never true    [place] in the Last Year: Never true  Transportation Needs: No Transportation Needs (08/18/2020)   PRAPARE - Hydrologist (Medical): No    Lack of Transportation (Non-Medical): No  Physical Activity: Inactive (08/18/2020)   Exercise Vital Sign    Days of Exercise per Week: 0 days    Minutes of Exercise per Session: 0 min  Stress: No Stress Concern  Present (08/18/2020)   [place]    Feeling of Stress : Not at all  Social Connections: [place] (08/18/2020)   Social Connection and Isolation Panel [NHANES]    Frequency of Communication with Friends and Family: More than three times a week    Frequency of Social Gatherings with Friends and Family: Three times a week    Attends Religious Services: More than 4 times per year    Active Member of Clubs or Organizations: No    Attends Archivist Meetings: More than 4 times per year    Marital Status: Married  Human resources officer Violence: Not At Risk (08/18/2020)   Humiliation, Afraid, Rape, and Kick questionnaire    Fear of Current or Ex-Partner: No    Emotionally Abused: No    Physically Abused: No    Sexually Abused: No    Family History  Problem Relation Age of Onset   Heart disease Mother    Cancer Sister      Review of Systems  Constitutional: Negative.  Negative for chills and fever.  HENT: Negative.  Negative for congestion and sore throat.   Respiratory: Negative.  Negative for cough and shortness of breath.   Cardiovascular:  Negative.  Negative for chest pain and palpitations.  Gastrointestinal:  Negative for abdominal pain, blood in stool, diarrhea, melena, nausea and vomiting.  Genitourinary: Negative.  Negative for dysuria and hematuria.  Musculoskeletal:  Positive for back pain. Negative for falls and neck pain.  Skin: Negative.  Negative for rash.  Neurological:  Positive for weakness. Negative for dizziness, sensory change, speech change, focal weakness, loss of consciousness and headaches.  All other systems reviewed and are negative.  Today's Vitals   09/06/21 1101  BP: 132/82  Pulse: 68  Temp: 98.6 F (37 C)  SpO2: 94%  Weight: 179 lb (81.2 kg)  Height: _0  (1.676 m)   Body mass index is 28.89 kg/m.   Physical Exam Vitals reviewed.  Constitutional:      Appearance: Normal appearance.  HENT:     Head: Normocephalic.     Mouth/Throat:     Mouth: Mucous membranes are moist.     Pharynx: Oropharynx is clear.  Eyes:     Extraocular Movements: Extraocular movements intact.     Conjunctiva/sclera: Conjunctivae normal.     Pupils: Pupils are equal, round, and reactive to light.  Cardiovascular:     Rate and Rhythm: Normal rate and regular rhythm.     Pulses: Normal pulses.     Heart sounds: Normal heart sounds.  Pulmonary:     Effort: Pulmonary effort is normal.     Breath sounds: Normal breath sounds.  Abdominal:     Palpations: Abdomen is soft.     Tenderness: There is no abdominal tenderness.  Musculoskeletal:     Cervical back: No tenderness.  Lymphadenopathy:     Cervical: No cervical adenopathy.  Neurological:     General: No focal deficit present.     Mental Status: He is alert and oriented to person, place, and time.     Cranial Nerves: No cranial nerve deficit.     Sensory: No sensory deficit.     Motor: No weakness.     Coordination: Coordination normal.     Gait: Gait abnormal (Slow gait with shuffling).     Deep Tendon Reflexes: Reflexes normal.  Psychiatric:         Mood and Affect: Mood normal.  Behavior: Behavior normal.      ASSESSMENT & PLAN: A total of 49 minutes was spent with the patient and counseling/coordination of care regarding preparing for this visit, review of most recent office visit notes, review of multiple chronic medical problems and their management, review of all medications, need for neurology evaluation for bilateral leg weakness and gait problems, education on nutrition, prognosis, documentation and need for follow-up.  Problem List Items Addressed This Visit       Cardiovascular and Mediastinum   Hypertensive heart disease (Chronic)    Well-controlled hypertension. BP Readings from Last 3 Encounters:  09/06/21 132/82  07/20/21 114/70  05/18/21 130/82  Continue valsartan 160 mg and amlodipine 2.5 mg daily Continue carvedilol 12.5 mg twice a day. No signs or symptoms of fluid overload on exam. No anginal symptoms noted.  Continue beta-blocker and rosuvastatin 40 mg daily       Coronary artery disease of native heart with stable angina pectoris, unspecified vessel or lesion type ([place])    No recent anginal episodes. Continue beta-blocker carvedilol 12.5 mg twice a day      Aortic atherosclerosis (HCC)    Stable.  Continue rosuvastatin 40 mg daily.        Other   Dyslipidemia    Stable.  Diet and nutrition discussed. Continue rosuvastatin 40 mg daily.      Gait disturbance - Primary    Slow walking with a cane and shuffling his feet.  No other parkinsonian features. Good muscle strength and preserved DTRs. Needs neurology evaluation. Referral placed today.      Relevant Orders   Ambulatory referral to Neurology   CBC with Differential/Platelet   Comprehensive metabolic panel   Vitamin Q65   Lipid panel   Hemoglobin A1c   History of stroke    Patient Instructions  Mantenimiento de la salud despus de los 67 aos de edad Health Maintenance After Age 3 Despus de los 65 aos de edad, corre  un riesgo mayor de Tourist information centre manager enfermedades e infecciones a Barrister's clerk, como tambin de sufrir lesiones por cadas. Las cadas son la causa principal de las fracturas de huesos y lesiones en la cabeza de personas mayores de 21 aos de edad. Recibir cuidados preventivos de forma regular puede ayudarlo a mantenerse saludable y en buen [place]. Los cuidados preventivos incluyen realizarse anlisis de forma regular y Actor en el estilo de vida segn las recomendaciones del mdico. Converse con el mdico sobre lo siguiente: Las pruebas de deteccin y los anlisis que debe Dispensing optician. Una prueba de deteccin es un estudio que se para Hydrographic surveyor la presencia de una enfermedad cuando no tiene sntomas. Un plan de dieta y ejercicios adecuado para usted. Qu debo saber sobre las pruebas de deteccin y los anlisis para prevenir cadas? Realizarse pruebas de deteccin y [employer] es la mejor manera de Hydrographic surveyor un problema de salud de forma temprana. El diagnstico y tratamiento tempranos le brindan la mejor oportunidad de Chief Technology Officer las afecciones mdicas que son comunes despus de los 76 aos de edad. Ciertas afecciones y elecciones de estilo de vida pueden hacer que sea ms propenso a sufrir Engineer, manufacturing. El mdico puede recomendarle lo siguiente: Controles regulares de la visin. Una visin deficiente y afecciones como las cataratas pueden hacer que sea ms propenso a sufrir Engineer, manufacturing. Si [place] lentes, asegrese de obtener una receta actualizada si su visin cambia. Revisin de medicamentos. Revise regularmente con el mdico todos los medicamentos que toma, incluidos los medicamentos de  venta libre. Consulte al [employer] efectos secundarios que pueden hacer que sea ms propenso a sufrir Engineer, manufacturing. Informe al mdico si alguno de los medicamentos que toma lo hace sentir mareado o somnoliento. Controles de fuerza y equilibrio. El mdico puede recomendar ciertos estudios para controlar su fuerza y  equilibrio al estar de pie, al caminar o al cambiar de posicin. Examen de los pies. El dolor y Chiropractor en los pies, como tambin no utilizar el calzado [place], pueden hacer que sea ms propenso a sufrir Engineer, manufacturing. Pruebas de deteccin, que incluyen las siguientes: Pruebas de deteccin para la osteoporosis. La osteoporosis es una afeccin que hace que los huesos se tornen ms dbiles y se quiebren con ms facilidad. Pruebas de deteccin para la presin arterial. Los cambios en la presin arterial y los medicamentos para Chief Technology Officer la presin arterial pueden hacerlo sentir mareado. Prueba de deteccin de la depresin. Es ms probable que sufra una cada si tiene miedo a caerse, se siente deprimido o se siente incapaz de Patent examiner. Prueba de deteccin de consumo de alcohol. Beber demasiado alcohol puede afectar su equilibrio y puede hacer que sea ms propenso a sufrir Engineer, manufacturing. Siga estas indicaciones en su casa: Estilo de vida No beba alcohol si: Su mdico le indica no hacerlo. Si bebe alcohol: Limite la cantidad que bebe a lo siguiente: De 0 a 1 medida por da para las mujeres. De 0 a 2 medidas por da para los hombres. Sepa cunta cantidad de alcohol hay en las bebidas que toma. En los [place], una medida equivale a una botella de cerveza de 12 oz (355 ml), un vaso de vino de 5 oz (148 ml) o un vaso de una bebida alcohlica de alta graduacin de 1 oz (44 ml). No consuma ningn producto que contenga nicotina o tabaco. Estos productos incluyen cigarrillos, tabaco para Higher education careers adviser y aparatos de vapeo, como los Psychologist, sport and exercise. Si necesita ayuda para dejar de consumir estos productos, consulte al [employer]. Actividad  Siga un programa de ejercicio regular para mantenerse en forma. Esto lo ayudar a Contractor equilibrio. Consulte al mdico qu tipos de ejercicios son adecuados para usted. Si necesita un bastn o un andador, selo segn las  recomendaciones del mdico. Utilice calzado con buen apoyo y suela antideslizante. Seguridad  Retire los [place] puedan causar tropiezos tales como alfombras, cables u obstculos. Instale equipos de seguridad, como barras para sostn en los baos y barandas de seguridad en las escaleras. [place] habitaciones y los pasillos bien iluminados. Indicaciones generales Hable con el mdico sobre sus riesgos de sufrir una cada. Infrmele a su mdico si: Se cae. Asegrese de informarle a su mdico acerca de todas las cadas, incluso aquellas que parecen ser [employer] Se siente mareado, cansado (tiene fatiga) o siente que pierde el equilibrio. Use los medicamentos de venta libre y los recetados solamente como se lo haya indicado el mdico. Estos incluyen suplementos. Siga una dieta sana y [place] un peso saludable. Una dieta saludable incluye productos lcteos descremados, carnes bajas en contenido de grasa ([place]), fibra de granos enteros, frijoles y [place] frutas y verduras. [place]. Realcese los estudios de rutina de la salud, dentales y de Public librarian. Resumen Tener un estilo de vida saludable y recibir cuidados preventivos pueden ayudar a Theatre stage manager salud y el bienestar despus de los 32 aos de [place]. Realizarse pruebas de deteccin y anlisis es la mejor manera de Hydrographic surveyor un  problema de salud de forma temprana y ayudarlo a Education officer, environmental cada. El diagnstico y tratamiento tempranos le brindan la mejor oportunidad de Chief Technology Officer las afecciones mdicas ms comunes en las personas mayores de 23 aos de edad. Las cadas son la causa principal de las fracturas de huesos y lesiones en la cabeza de personas mayores de 22 aos de edad. Tome precauciones para evitar una cada en su casa. Trabaje con el mdico para saber qu cambios que puede hacer para mejorar su salud y [place], y [place]. Esta informacin no tiene Marine scientist el consejo del mdico.  Asegrese de hacerle al mdico cualquier pregunta que tenga. Document Revised: 07/13/2020 Document Reviewed: 07/13/2020 Elsevier Patient Education  [place], MD [facility]

## 2021-09-18 ENCOUNTER — Ambulatory Visit (INDEPENDENT_AMBULATORY_CARE_PROVIDER_SITE_OTHER): Payer: Medicare HMO

## 2021-09-18 VITALS — BP 130/80 | HR 61 | Temp 97.4°F | Ht 66.0 in | Wt 179.6 lb

## 2021-09-18 DIAGNOSIS — Z0000 Encounter for general adult medical examination without abnormal findings: Secondary | ICD-10-CM

## 2021-09-18 NOTE — Patient Instructions (Addendum)
[name] , Thank you for taking time to come for your Medicare Wellness Visit. I appreciate your ongoing commitment to your health goals. Please review the following plan we discussed and let me know if I can assist you in the future.   Screening recommendations/referrals: Colonoscopy: discontinued due to age Recommended yearly ophthalmology/optometry visit for glaucoma screening and checkup Recommended yearly dental visit for hygiene and checkup  Vaccinations: Influenza vaccine: 11/30/2020 Pneumococcal vaccine: 05/09/2018; due second dose Tdap vaccine: declined Shingles vaccine: declined   Covid-19: declined  Advanced directives: No  Conditions/risks identified: Yes  Next appointment: Please schedule your next Medicare Wellness Visit with your Nurse Health Advisor in 1 year by calling [phone].   Preventive Care 21 Years and Older, Male Preventive care refers to lifestyle choices and visits with your health care provider that can promote health and wellness. What does preventive care include? A yearly physical exam. This is also called an annual well check. Dental exams once or twice a year. Routine eye exams. Ask your health care provider how often you should have your eyes checked. Personal lifestyle choices, including: Daily care of your teeth and gums. Regular physical activity. Eating a healthy diet. Avoiding tobacco and drug use. Limiting alcohol use. Practicing safe sex. Taking low doses of aspirin every day. Taking vitamin and mineral supplements as recommended by your health care provider. What happens during an annual well check? The services and screenings done by your health care provider during your annual well check will depend on your age, overall health, lifestyle risk factors, and family history of disease. Counseling  Your health care provider may ask you questions about your: Alcohol use. Tobacco use. Drug use. Emotional well-being. Home and  relationship well-being. Sexual activity. Eating habits. History of falls. Memory and ability to understand (cognition). Work and work Statistician. Screening  You may have the following tests or measurements: Height, weight, and BMI. Blood pressure. Lipid and cholesterol levels. These may be checked every 5 years, or more frequently if you are over 26 years old. Skin check. Lung cancer screening. You may have this screening every year starting at age 56 if you have a 30-pack-year history of smoking and currently smoke or have quit within the past 15 years. Fecal occult blood test (FOBT) of the stool. You may have this test every year starting at age 7. Flexible sigmoidoscopy or colonoscopy. You may have a sigmoidoscopy every 5 years or a colonoscopy every 10 years starting at age 61. Prostate cancer screening. Recommendations will vary depending on your family history and other risks. Hepatitis C blood test. Hepatitis B blood test. Sexually transmitted disease (STD) testing. Diabetes screening. This is done by checking your blood sugar (glucose) after you have not eaten for a while (fasting). You may have this done every 1-3 years. Abdominal aortic aneurysm (AAA) screening. You may need this if you are a current or former smoker. Osteoporosis. You may be screened starting at age 77 if you are at high risk. Talk with your health care provider about your test results, treatment options, and if necessary, the need for more tests. Vaccines  Your health care provider may recommend certain vaccines, such as: Influenza vaccine. This is recommended every year. Tetanus, diphtheria, and acellular pertussis (Tdap, Td) vaccine. You may need a Td booster every 10 years. Zoster vaccine. You may need this after age 52. Pneumococcal 13-valent conjugate (PCV13) vaccine. One dose is recommended after age 53. Pneumococcal polysaccharide (PPSV23) vaccine. One dose is recommended after  age 2. Talk to your  health care provider about which screenings and vaccines you need and how often you need them. This information is not intended to replace advice given to you by your health care provider. Make sure you discuss any questions you have with your health care provider. Document Released: 03/04/2015 Document Revised: 10/26/2015 Document Reviewed: 12/07/2014 Elsevier Interactive Patient Education  2017 [place] Prevention in the Home Falls can cause injuries. They can happen to people of all ages. There are many things you can do to make your home safe and to help prevent falls. What can I do on the outside of my home? Regularly fix the edges of walkways and driveways and fix any cracks. Remove anything that might make you trip as you walk through a door, such as a raised step or threshold. Trim any bushes or trees on the path to your home. Use bright outdoor lighting. Clear any walking paths of anything that might make someone trip, such as rocks or tools. Regularly check to see if handrails are loose or broken. Make sure that both sides of any steps have handrails. Any raised decks and porches should have guardrails on the edges. Have any leaves, snow, or ice cleared regularly. Use sand or salt on walking paths during winter. Clean up any spills in your garage right away. This includes oil or grease spills. What can I do in the bathroom? Use night lights. Install grab bars by the toilet and in the tub and shower. Do not use towel bars as grab bars. Use non-skid mats or decals in the tub or shower. If you need to sit down in the shower, use a plastic, non-slip stool. Keep the floor dry. Clean up any water that spills on the floor as soon as it happens. Remove soap buildup in the tub or shower regularly. Attach bath mats securely with double-sided non-slip rug tape. Do not have throw rugs and other things on the floor that can make you trip. What can I do in the bedroom? Use night  lights. Make sure that you have a light by your bed that is easy to reach. Do not use any sheets or blankets that are too big for your bed. They should not hang down onto the floor. Have a firm chair that has side arms. You can use this for support while you get dressed. Do not have throw rugs and other things on the floor that can make you trip. What can I do in the kitchen? Clean up any spills right away. Avoid walking on wet floors. Keep items that you use a lot in easy-to-reach places. If you need to reach something above you, use a strong step stool that has a grab bar. Keep electrical cords out of the way. Do not use floor polish or wax that makes floors slippery. If you must use wax, use non-skid floor wax. Do not have throw rugs and other things on the floor that can make you trip. What can I do with my stairs? Do not leave any items on the stairs. Make sure that there are handrails on both sides of the stairs and use them. Fix handrails that are broken or loose. Make sure that handrails are as long as the stairways. Check any carpeting to make sure that it is firmly attached to the stairs. Fix any carpet that is loose or worn. Avoid having throw rugs at the top or bottom of the stairs. If you do  have throw rugs, attach them to the floor with carpet tape. Make sure that you have a light switch at the top of the stairs and the bottom of the stairs. If you do not have them, ask someone to add them for you. What else can I do to help prevent falls? Wear shoes that: Do not have high heels. Have rubber bottoms. Are comfortable and fit you well. Are closed at the toe. Do not wear sandals. If you use a stepladder: Make sure that it is fully opened. Do not climb a closed stepladder. Make sure that both sides of the stepladder are locked into place. Ask someone to hold it for you, if possible. Clearly mark and make sure that you can see: Any grab bars or handrails. First and last  steps. Where the edge of each step is. Use tools that help you move around (mobility aids) if they are needed. These include: Canes. Walkers. Scooters. Crutches. Turn on the lights when you go into a dark area. Replace any light bulbs as soon as they burn out. Set up your furniture so you have a clear path. Avoid moving your furniture around. If any of your floors are uneven, fix them. If there are any pets around you, be aware of where they are. Review your medicines with your doctor. Some medicines can make you feel dizzy. This can increase your chance of falling. Ask your doctor what other things that you can do to help prevent falls. This information is not intended to replace advice given to you by your health care provider. Make sure you discuss any questions you have with your health care provider. Document Released: 12/02/2008 Document Revised: 07/14/2015 Document Reviewed: 03/12/2014 Elsevier Interactive Patient Education  2017 [employer].

## 2021-09-18 NOTE — Progress Notes (Signed)
Subjective:   [name] is a 81 y.o. male who presents for Medicare Annual/Subsequent preventive examination.  Review of Systems     Cardiac Risk Factors include: advanced age (>59mn, >>61women);diabetes mellitus;dyslipidemia;family history of premature cardiovascular disease;hypertension;male gender     Objective:    Today's Vitals   09/18/21 1020  BP: 130/80  Pulse: 61  Temp: (!) 97.4 F (36.3 C)  SpO2: 98%  Weight: 179 lb 9.6 oz (81.5 kg)  Height: _0  (1.676 m)  PainSc: 0-No pain   Body mass index is 28.99 kg/m.     09/18/2021   10:31 AM 08/08/2020   11:00 PM 11/05/2017    7:04 AM 11/04/2017    2:39 PM 04/14/2017    9:52 PM 05/03/2016    8:00 PM 05/02/2016    9:55 AM  Advanced Directives  Does Patient Have a Medical Advance Directive? No No  No No No No  Would patient like information on creating a medical advance directive? No - Patient declined No - Patient declined No - Patient declined  No - Patient declined No - Patient declined     Current Medications (verified) Outpatient Encounter Medications as of 09/18/2021  Medication Sig   acetaminophen (TYLENOL) 325 MG tablet Take 325 mg by mouth daily.   amLODipine (NORVASC) 2.5 MG tablet Take 1 tablet (2.5 mg total) by mouth daily.   carvedilol (COREG) 12.5 MG tablet Take 1 tablet (12.5 mg total) by mouth 2 (two) times daily.   pantoprazole (PROTONIX) 40 MG tablet Take 1 tablet (40 mg total) by mouth daily.   rosuvastatin (CRESTOR) 40 MG tablet Take 1 tablet (40 mg total) by mouth daily.   valsartan (DIOVAN) 160 MG tablet Take 1 tablet (160 mg total) by mouth daily.   No facility-administered encounter medications on file as of 09/18/2021.    Allergies (verified) Lipitor [atorvastatin], Morphine and related, and Pork-derived products   History: Past Medical History:  Diagnosis Date   Blurred vision 05/17/2016   CAD (coronary artery disease), native coronary artery 06/14/2008   a. BMS to LCx & OM 2008 b.  03/2017 CABG x 4 (LIMA to LAD, SVG to diag 1, SVG to distal Circ, SVG to PDA)   Chest pain 04/16/2012   Diplopia    Echocardiogram    Echo 10/19: mod LVH, EF 55-60, no RWMA, Gr 1 DD, trivial AI, MAC, mod LAE, prob small post effusion   Essential hypertension 05/17/2016   Hyperlipidemia with target low density lipoprotein (LDL) cholesterol less than 70 mg/dL 06/14/2008   Qualifier: Diagnosis of  By: FMare Ferrari RMA, [name]     Hypertension    Hypertensive heart disease 06/14/2008   Qualifier: Diagnosis of  By: FMare Ferrari RMA, [name]     Hypertensive urgency, malignant 04/16/2012   Hypokalemia 04/14/2017   Internal hemorrhoids 04/19/2012   [facility]schemic stroke ([facility] 05/02/2016   Non-ST elevation (NSTEMI) myocardial infarction ([facility]    pandiverticulosis 04/22/2012   12/17/2011. [facility] JJuanita CraverMD. Colonoscopy. Moderate sized internal hemorrhoids and extensive pandiverticulosis. Repeat 5 years.     Pericardial effusion    Echo 11/19: mild focal basal septal hypertrophy, EF 55-60, Gr 2 DD, trivial AI, trivial TR, trivial PI, small to mod eff post to heart - no evidence of RV collapse.>> repeat limited echo in 03/2018 // Echo 03/2018: EF 55-60, mild LVH, +diastolic dysfunction, no RWMA, PASP 23, trivial pericardial effusion    Polycythemia vera(238.4) 04/17/2012   S/P CABG x 4 04/17/2017  Syncope and collapse 04/17/2012   Pt syncopized while sitting in bed giving history @ time of admission to hospital Tachycardic (appeared sinus) to 120s-130s and hypotensive to 50s/30s.  Unresponsive initially >> Spontaneously resolved after 2-3 minutes >> return to baseline ~30 minutes    Vertigo    Past Surgical History:  Procedure Laterality Date   CORONARY ARTERY BYPASS GRAFT N/A 04/17/2017   Procedure: CORONARY ARTERY BYPASS GRAFTING (CABG) x4 , using left internal mammary artery  to LAD and right leg greater saphenous vein harvested endoscopically  to PDA, Diagonal I and Circumflex;  Surgeon: [name], MD;  Location: [facility];  Service: Open Heart Surgery;  Laterality: N/A;   CORONARY ARTERY BYPASS GRAFT  2020   CORONARY STENT PLACEMENT     LEFT HEART CATH AND CORONARY ANGIOGRAPHY N/A 04/16/2017   Procedure: LEFT HEART CATH AND CORONARY ANGIOGRAPHY;  Surgeon: Sherren Mocha, MD;  Location: [facility];  Service: Cardiovascular;  Laterality: N/A;   TEE WITHOUT CARDIOVERSION N/A 04/17/2017   Procedure: TRANSESOPHAGEAL ECHOCARDIOGRAM (TEE);  Surgeon: [name], MD;  Location: [facility];  Service: Open Heart Surgery;  Laterality: N/A;   Family History  Problem Relation Age of Onset   Heart disease Mother    Cancer Sister    Social History   Socioeconomic History   Marital status: Married    Spouse name: Not on file   Number of children: Not on file   Years of education: Not on file   Highest education level: Not on file  Occupational History   Not on file  Tobacco Use   Smoking status: Never   Smokeless tobacco: Never  Vaping Use   Vaping Use: Never used  Substance and Sexual Activity   Alcohol use: No    Alcohol/week: 0.0 standard drinks of alcohol   Drug use: No   Sexual activity: Not on file  Other Topics Concern   Not on file  Social History Narrative   Lives in [place] with Wife and 2 sons.  From [place].  To [place] ~2000.     Currently retired but worked in [place] Strain: [place]  (09/18/2021)   Overall Financial Resource Strain (CARDIA)    Difficulty of Paying Living Expenses: Not hard at all  Food Insecurity: No Food Insecurity (09/18/2021)   Hunger Vital Sign    Worried About Running Out of Food in the Last Year: Never true    [place] in the Last Year: Never true  Transportation Needs: No Transportation Needs (09/18/2021)   PRAPARE - Hydrologist (Medical): No    Lack of Transportation (Non-Medical): No  Physical Activity: Sufficiently  Active (09/18/2021)   Exercise Vital Sign    Days of Exercise per Week: 5 days    Minutes of Exercise per Session: 30 min  Stress: No Stress Concern Present (09/18/2021)   [place]    Feeling of Stress : Not at all  Social Connections: [place] (09/18/2021)   Social Connection and Isolation Panel [NHANES]    Frequency of Communication with Friends and Family: More than three times a week    Frequency of Social Gatherings with Friends and Family: Three times a week    Attends Religious Services: More than 4 times per year    Active Member of Clubs or Organizations: No  Attends Music therapist: More than 4 times per year    Marital Status: Married    Tobacco Counseling Counseling given: Not Answered   Clinical Intake:  Pre-visit preparation completed: Yes  Pain : No/denies pain Pain Score: 0-No pain     BMI - recorded: 28.99 Nutritional Status: BMI 25 -29 Overweight Nutritional Risks: None Diabetes: Yes CBG done?: No Did pt. bring in CBG monitor from home?: No  How often do you need to have someone help you when you read instructions, pamphlets, or other written materials from your doctor or pharmacy?: 1 - Never What is the last grade level you completed in school?: HSG  Diabetic? yes  Interpreter Needed?: No  Information entered by :: Lisette Abu, LPN.   Activities of Daily Living    09/18/2021   10:45 AM  In your present state of health, do you have any difficulty performing the following activities:  Hearing? 0  Vision? 0  Difficulty concentrating or making decisions? 0  Walking or climbing stairs? 1  Comment uses a cane  Dressing or bathing? 0  Doing errands, shopping? 0  Preparing Food and eating ? N  Using the Toilet? N  In the past six months, have you accidently leaked urine? N  Do you have problems with loss of bowel control? N  Managing your  Medications? N  Managing your Finances? N  Housekeeping or managing your Housekeeping? N    Patient Care Team: Horald Pollen, MD as PCP - General (Internal Medicine) Sherren Mocha, MD as PCP - Cardiology (Cardiology) Robyn Haber, MD (Family Medicine) [facility] as Consulting Physician (Ophthalmology)  Indicate any recent Medical Services you may have received from other than [facility] providers in the past year (date may be approximate).     Assessment:   This is a routine wellness examination for Raequan.  Hearing/Vision screen Hearing Screening - Comments:: Patient denied any hearing difficulty.   No hearing aids.  Vision Screening - Comments:: Patient does not wear any corrective lenses/contacts.    Dietary issues and exercise activities discussed: Current Exercise Habits: Home exercise routine, Type of exercise: walking;Other - see comments (yard work, gardening), Time (Minutes): 30, Frequency (Times/Week): 5, Weekly Exercise (Minutes/Week): 150, Intensity: Mild, Exercise limited by: orthopedic condition(s);cardiac condition(s)   Goals Addressed             This Visit's Progress    My goal is to lose some weight.  My weight goal is 170 pounds.        Depression Screen    09/18/2021   10:43 AM 11/30/2020    1:17 PM 08/18/2020    3:12 PM 05/31/2020    1:10 PM 05/09/2018   10:23 AM 11/12/2017    3:05 PM 10/28/2017   12:16 PM  PHQ 2/9 Scores  PHQ - 2 Score 0 0 0 3 0 0 0  PHQ- 9 Score     0      Fall Risk    09/18/2021   10:45 AM 11/30/2020    1:17 PM 08/18/2020    3:12 PM 05/31/2020    1:10 PM 05/09/2018   10:23 AM  Fall Risk   Falls in the past year? 0 0 1 1 0  Number falls in past yr: 0 0 1 1   Injury with Fall? 0 0 1 0   Risk for fall due to : History of fall(s);Impaired balance/gait  Impaired balance/gait;History of fall(s) Impaired balance/gait;Impaired mobility;History of fall(s)  Follow up Falls evaluation completed  Falls  evaluation completed Falls prevention discussed     FALL RISK PREVENTION PERTAINING TO THE HOME:  Any stairs in or around the home? No  If so, are there any without handrails? No  Home free of loose throw rugs in walkways, pet beds, electrical cords, etc? Yes  Adequate lighting in your home to reduce risk of falls? Yes   ASSISTIVE DEVICES UTILIZED TO PREVENT FALLS:  Life alert? No  Use of a cane, walker or w/c? Yes  Grab bars in the bathroom? Yes  Shower chair or bench in shower? Yes  Elevated toilet seat or a handicapped toilet? No   TIMED UP AND GO:  Was the test performed? Yes .  Length of time to ambulate 10 feet: 13 sec.   Gait slow and steady with assistive device  Cognitive Function:    09/18/2021   10:47 AM  MMSE - Mini Mental State Exam  Orientation to time 5  Orientation to Place 5  Registration 3  Attention/ Calculation 5  Recall 3  Language- name 2 objects 2  Language- repeat 1  Language- follow 3 step command 3  Language- read & follow direction 1  Write a sentence 1  Copy design 1  Total score 30        Immunizations Immunization History  Administered Date(s) Administered   Fluad Quad(high Dose 65+) 11/30/2020   PFIZER(Purple Top)SARS-COV-2 Vaccination 04/21/2019, 05/19/2019, 11/24/2019   Pneumococcal Conjugate-13 05/09/2018    TDAP status: Due, Education has been provided regarding the importance of this vaccine. Advised may receive this vaccine at local pharmacy or Health Dept. Aware to provide a copy of the vaccination record if obtained from local pharmacy or Health Dept. Verbalized acceptance and understanding.  Flu Vaccine status: Up to date  Pneumococcal vaccine status: Due, Education has been provided regarding the importance of this vaccine. Advised may receive this vaccine at local pharmacy or Health Dept. Aware to provide a copy of the vaccination record if obtained from local pharmacy or Health Dept. Verbalized acceptance and  understanding.  Covid-19 vaccine status: Completed vaccines  Qualifies for Shingles Vaccine? Yes   Zostavax completed No   Shingrix Completed?: No.    Education has been provided regarding the importance of this vaccine. Patient has been advised to call insurance company to determine out of pocket expense if they have not yet received this vaccine. Advised may also receive vaccine at local pharmacy or Health Dept. Verbalized acceptance and understanding.  Screening Tests Health Maintenance  Topic Date Due   FOOT EXAM  Never done   OPHTHALMOLOGY EXAM  Never done   Diabetic kidney evaluation - Urine ACR  Never done   TETANUS/TDAP  Never done   Zoster Vaccines- Shingrix (1 of 2) Never done   Pneumonia Vaccine 81+ Years old (2 - PPSV23 or PCV20) 05/09/2019   COVID-19 Vaccine (4 - Pfizer series) 01/19/2020   HEMOGLOBIN A1C  09/04/2021   INFLUENZA VACCINE  09/19/2021   Diabetic kidney evaluation - GFR measurement  07/21/2022   Hepatitis C Screening  Completed   HPV VACCINES  Aged Out    Health Maintenance  Health Maintenance Due  Topic Date Due   FOOT EXAM  Never done   OPHTHALMOLOGY EXAM  Never done   Diabetic kidney evaluation - Urine ACR  Never done   TETANUS/TDAP  Never done   Zoster Vaccines- Shingrix (1 of 2) Never done   Pneumonia Vaccine 77+ Years old (2 -  PPSV23 or PCV20) 05/09/2019   COVID-19 Vaccine (4 - Pfizer series) 01/19/2020   HEMOGLOBIN A1C  09/04/2021    Colorectal cancer screening: No longer required.   Lung Cancer Screening: (Low Dose CT Chest recommended if Age 48-80 years, 30 pack-year currently smoking OR have quit w/in 15years.) does not qualify.   Lung Cancer Screening Referral: no  Additional Screening:  Hepatitis C Screening: does qualify; Completed 05/31/2020  Vision Screening: Recommended annual ophthalmology exams for early detection of glaucoma and other disorders of the eye. Is the patient up to date with their annual eye exam?  No  Who is  the provider or what is the name of the office in which the patient attends annual eye exams? Patient refused. If pt is not established with a provider, would they like to be referred to a provider to establish care? No .   Dental Screening: Recommended annual dental exams for proper oral hygiene  Community Resource Referral / Chronic Care Management: CRR required this visit?  No   CCM required this visit?  No      Plan:     I have personally reviewed and noted the following in the patient's chart:   Medical and social history Use of alcohol, tobacco or illicit drugs  Current medications and supplements including opioid prescriptions. Patient is not currently taking opioid prescriptions. Functional ability and status Nutritional status Physical activity Advanced directives List of other physicians Hospitalizations, surgeries, and ER visits in previous 12 months Vitals Screenings to include cognitive, depression, and falls Referrals and appointments  In addition, I have reviewed and discussed with patient certain preventive protocols, quality metrics, and best practice recommendations. A written personalized care plan for preventive services as well as general preventive health recommendations were provided to patient.     Sheral Flow, LPN   4/33/2951   Nurse Notes:  Hearing Screening - Comments:: Patient denied any hearing difficulty.   No hearing aids.  Vision Screening - Comments:: Patient does not wear any corrective lenses/contacts.

## 2021-10-18 NOTE — Progress Notes (Deleted)
Assessment/Plan:    ***  Subjective:   [name] was seen today in the movement disorders clinic for neurologic consultation at the request of Horald Pollen, *.  The consultation is for the evaluation of shuffling gait.  Patient was referred here about 1 year ago for the same, but when we called him for the appointment, it was declined, stating that he was already seeing someone.   Specific Symptoms:  Tremor: {yes no:314532} Family hx of similar:  {yes no:314532} Voice: *** Sleep: ***  Vivid Dreams:  {yes no:314532}  Acting out dreams:  {yes no:314532} Wet Pillows: {yes no:314532} Postural symptoms:  {yes no:314532}  Falls?  {yes no:314532} Bradykinesia symptoms: {parkinson brady:18041} Loss of smell:  {yes no:314532} Loss of taste:  {yes no:314532} Urinary Incontinence:  {yes no:314532} Difficulty Swallowing:  {yes no:314532} Handwriting, micrographia: {yes no:314532} Trouble with ADL's:  {yes no:314532}  Trouble buttoning clothing: {yes no:314532} Depression:  {yes no:314532} Memory changes:  {yes no:314532} Hallucinations:  {yes no:314532}  visual distortions: {yes no:314532} N/V:  {yes no:314532} Lightheaded:  {yes no:314532}  Syncope: {yes no:314532} Diplopia:  {yes no:314532} Dyskinesia:  {yes no:314532} Prior exposure to reglan/antipsychotics: {yes no:314532}  CT brain performed in June, 2022 which just demonstrated small vessel disease.  MRI brain last performed in September, 2019 with evidence of moderate small vessel disease and atrophy.  There was old lacunes in left basal ganglia, thalamus, pons, cerebellum and multiple areas of chronic microhemorrhages.  PREVIOUS MEDICATIONS: {Parkinson's RX:18200}  ALLERGIES:   Allergies  Allergen Reactions   Lipitor [Atorvastatin] Shortness Of Breath and Other (See Comments)    Sores non head   Morphine And Related Shortness Of Breath, Swelling and Rash    Throat swelling   Pork-Derived Products  Swelling and Rash    Tongue swelling No pork products -     CURRENT MEDICATIONS:  No outpatient medications have been marked as taking for the 10/24/21 encounter (Appointment) with Norman Piacentini, Eustace Quail, DO.     Objective:   VITALS:  There were no vitals filed for this visit.  GEN:  The patient appears stated age and is in NAD. HEENT:  Normocephalic, atraumatic.  The mucous membranes are moist. The superficial temporal arteries are without ropiness or tenderness. CV:  RRR Lungs:  CTAB Neck/HEME:  There are no carotid bruits bilaterally.  Neurological examination:  Orientation: The patient is alert and oriented x3.  Cranial nerves: There is good facial symmetry. Extraocular muscles are intact. The visual fields are full to confrontational testing. The speech is fluent and clear. Soft palate rises symmetrically and there is no tongue deviation. Hearing is intact to conversational tone. Sensation: Sensation is intact to light and pinprick throughout (facial, trunk, extremities). Vibration is intact at the bilateral big toe. There is no extinction with double simultaneous stimulation. There is no sensory dermatomal level identified. Motor: Strength is 5/5 in the bilateral upper and lower extremities.   Shoulder shrug is equal and symmetric.  There is no pronator drift. Deep tendon reflexes: Deep tendon reflexes are 2/4 at the bilateral biceps, triceps, brachioradialis, patella and achilles. Plantar responses are downgoing bilaterally.  Movement examination: Tone: There is ***tone in the bilateral upper extremities.  The tone in the lower extremities is ***.  Abnormal movements: *** Coordination:  There is *** decremation with RAM's, *** Gait and Station: The patient has *** difficulty arising out of a deep-seated chair without the use of the hands. The patient's stride length is ***.  The  patient has a *** pull test.     I have reviewed and interpreted the following labs independently   Chemistry       Component Value Date/Time   NA 142 07/20/2021 1522   K 4.7 07/20/2021 1522   CL 103 07/20/2021 1522   CO2 24 07/20/2021 1522   BUN 17 07/20/2021 1522   CREATININE 0.97 07/20/2021 1522   CREATININE 0.95 01/20/2016 1047      Component Value Date/Time   CALCIUM 9.6 07/20/2021 1522   ALKPHOS 97 07/20/2021 1522   AST 23 07/20/2021 1522   ALT 21 07/20/2021 1522   BILITOT 0.4 07/20/2021 1522      Lab Results  Component Value Date   TSH 2.26 05/31/2020   Lab Results  Component Value Date   WBC 8.9 07/20/2021   HGB 14.1 07/20/2021   HCT 42.8 07/20/2021   MCV 89 07/20/2021   PLT 244 07/20/2021     Total time spent on today's visit was ***60 minutes, including both face-to-face time and nonface-to-face time.  Time included that spent on review of records (prior notes available to me/labs/imaging if pertinent), discussing treatment and goals, answering patient's questions and coordinating care.  Cc:  Horald Pollen, MD

## 2021-10-20 ENCOUNTER — Ambulatory Visit: Payer: Medicare HMO | Admitting: Nurse Practitioner

## 2021-10-24 ENCOUNTER — Ambulatory Visit: Payer: Medicare HMO | Admitting: Neurology

## 2021-10-27 ENCOUNTER — Ambulatory Visit: Payer: Medicare HMO | Admitting: Cardiovascular Disease

## 2021-12-27 ENCOUNTER — Other Ambulatory Visit: Payer: Self-pay

## 2021-12-27 ENCOUNTER — Encounter (HOSPITAL_BASED_OUTPATIENT_CLINIC_OR_DEPARTMENT_OTHER): Payer: Self-pay

## 2021-12-27 ENCOUNTER — Emergency Department (HOSPITAL_BASED_OUTPATIENT_CLINIC_OR_DEPARTMENT_OTHER): Payer: Medicare HMO | Admitting: Radiology

## 2021-12-27 ENCOUNTER — Emergency Department (HOSPITAL_BASED_OUTPATIENT_CLINIC_OR_DEPARTMENT_OTHER): Payer: Medicare HMO

## 2021-12-27 ENCOUNTER — Emergency Department (HOSPITAL_BASED_OUTPATIENT_CLINIC_OR_DEPARTMENT_OTHER)
Admission: EM | Admit: 2021-12-27 | Discharge: 2021-12-27 | Disposition: A | Discharge status: Home / Self Care | Payer: Medicare HMO | Attending: Emergency Medicine | Admitting: Emergency Medicine

## 2021-12-27 ENCOUNTER — Encounter (HOSPITAL_COMMUNITY): Payer: Self-pay

## 2021-12-27 DIAGNOSIS — I10 Essential (primary) hypertension: Secondary | ICD-10-CM | POA: Diagnosis not present

## 2021-12-27 DIAGNOSIS — R0602 Shortness of breath: Secondary | ICD-10-CM | POA: Diagnosis not present

## 2021-12-27 DIAGNOSIS — Z79899 Other long term (current) drug therapy: Secondary | ICD-10-CM | POA: Insufficient documentation

## 2021-12-27 DIAGNOSIS — R059 Cough, unspecified: Secondary | ICD-10-CM | POA: Diagnosis not present

## 2021-12-27 DIAGNOSIS — I2510 Atherosclerotic heart disease of native coronary artery without angina pectoris: Secondary | ICD-10-CM | POA: Insufficient documentation

## 2021-12-27 DIAGNOSIS — Z951 Presence of aortocoronary bypass graft: Secondary | ICD-10-CM | POA: Diagnosis not present

## 2021-12-27 DIAGNOSIS — R519 Headache, unspecified: Secondary | ICD-10-CM | POA: Diagnosis present

## 2021-12-27 DIAGNOSIS — E876 Hypokalemia: Secondary | ICD-10-CM | POA: Diagnosis not present

## 2021-12-27 DIAGNOSIS — I2693 Single subsegmental pulmonary embolism without acute cor pulmonale: Secondary | ICD-10-CM | POA: Diagnosis not present

## 2021-12-27 DIAGNOSIS — U071 COVID-19: Secondary | ICD-10-CM | POA: Diagnosis not present

## 2021-12-27 DIAGNOSIS — I2699 Other pulmonary embolism without acute cor pulmonale: Secondary | ICD-10-CM | POA: Diagnosis not present

## 2021-12-27 LAB — RESP PANEL BY RT-PCR (FLU A&B, COVID) ARPGX2
Influenza A by PCR: NEGATIVE
Influenza B by PCR: NEGATIVE
SARS Coronavirus 2 by RT PCR: POSITIVE — AB

## 2021-12-27 LAB — BASIC METABOLIC PANEL
Anion gap: 10 (ref 5–15)
BUN: 13 mg/dL (ref 8–23)
CO2: 28 mmol/L (ref 22–32)
Calcium: 9.5 mg/dL (ref 8.9–10.3)
Chloride: 102 mmol/L (ref 98–111)
Creatinine, Ser: 0.82 mg/dL (ref 0.61–1.24)
GFR, Estimated: >60 mL/min (ref >60)
Glucose, Bld: 95 mg/dL (ref 70–99)
Potassium: 3.4 mmol/L — ABNORMAL LOW (ref 3.5–5.1)
Sodium: 140 mmol/L (ref 135–145)

## 2021-12-27 LAB — CBC
HCT: 44.4 % (ref 39.0–52.0)
Hemoglobin: 14.8 g/dL (ref 13.0–17.0)
MCH: 28.9 pg (ref 26.0–34.0)
MCHC: 33.3 g/dL (ref 30.0–36.0)
MCV: 86.7 fL (ref 80.0–100.0)
Platelets: 199 10*3/uL (ref 150–400)
RBC: 5.12 MIL/uL (ref 4.22–5.81)
RDW: 14.5 % (ref 11.5–15.5)
WBC: 6.5 10*3/uL (ref 4.0–10.5)
nRBC: 0 % (ref 0.0–0.2)

## 2021-12-27 LAB — D-DIMER, QUANTITATIVE: D-Dimer, Quant: 0.66 ug/mL-FEU — ABNORMAL HIGH (ref 0.00–0.50)

## 2021-12-27 MED ORDER — APIXABAN 2.5 MG PO TABS
10.0000 mg | ORAL_TABLET | Freq: Once | ORAL | Status: AC
  Administered 2021-12-27: 10 mg via ORAL
  Filled 2021-12-27: qty 4

## 2021-12-27 MED ORDER — APIXABAN (ELIQUIS) VTE STARTER PACK (10MG AND 5MG): ORAL_TABLET | ORAL | 0 refills | Status: DC

## 2021-12-27 MED ORDER — APIXABAN (ELIQUIS) EDUCATION KIT FOR DVT/PE PATIENTS: PACK | Freq: Once | Status: AC

## 2021-12-27 MED ORDER — IOHEXOL 350 MG/ML SOLN
100.0000 mL | Freq: Once | INTRAVENOUS | Status: AC | PRN
  Administered 2021-12-27: 64 mL via INTRAVENOUS

## 2021-12-27 MED ORDER — ACETAMINOPHEN 500 MG PO TABS
1000.0000 mg | ORAL_TABLET | Freq: Once | ORAL | Status: AC
  Administered 2021-12-27: 1000 mg via ORAL
  Filled 2021-12-27: qty 2

## 2021-12-27 NOTE — ED Provider Notes (Signed)
[facility] EMERGENCY DEPT Provider Note   CSN: [id number] Arrival date & time: 12/27/21  1050     History  Chief Complaint  Patient presents with   Covid Exposure    [name] is a 81 y.o. male with history of hypertension, hyperlipidemia, stroke in 2018, CAD s/p CABG x4 in 2019 who presents to the emergency department complaining of cough, headache, and fatigue for the past 10 days.  States that his wife was diagnosed with COVID last week.  He reports feeling increasingly weak at home, causing him to have several falls.  Most recent fall yesterday.  Denies any head trauma from these falls, and is not on chronic anticoagulation.  Per son at bedside during repeat evaluation, son reports patient has been having symptoms for two days and refuses to take any medication.   HPI     Home Medications Prior to Admission medications   Medication Sig Start Date End Date Taking? Authorizing Provider  APIXABAN Everlene Balls) VTE STARTER PACK (10MG  AND 5MG ) Take as directed on package: start with two-5mg  tablets twice daily for 7 days. On day 8, switch to one-5mg  tablet twice daily. 12/27/21  Yes Hayven Croy T, PA-C  acetaminophen (TYLENOL) 325 MG tablet Take 325 mg by mouth daily.    [provider]  amLODipine (NORVASC) 2.5 MG tablet Take 1 tablet (2.5 mg total) by mouth daily. 07/20/21   Tonny Bollman, MD  carvedilol (COREG) 12.5 MG tablet Take 1 tablet (12.5 mg total) by mouth 2 (two) times daily. 03/07/21   Georgina Quint, MD  pantoprazole (PROTONIX) 40 MG tablet Take 1 tablet (40 mg total) by mouth daily. 03/07/21   Georgina Quint, MD  rosuvastatin (CRESTOR) 40 MG tablet Take 1 tablet (40 mg total) by mouth daily. 03/07/21   Georgina Quint, MD  valsartan (DIOVAN) 160 MG tablet Take 1 tablet (160 mg total) by mouth daily. 07/20/21   Tonny Bollman, MD      Allergies    Lipitor [atorvastatin], Morphine and related, and Pork-derived products     Review of Systems   Review of Systems  Constitutional:  Positive for fatigue.  HENT:  Positive for congestion.   Respiratory:  Positive for cough and shortness of breath.   Musculoskeletal:  Positive for myalgias.  Neurological:  Positive for dizziness and headaches. Negative for syncope.  All other systems reviewed and are negative.   Physical Exam Updated Vital Signs BP (!) 189/104 (BP Location: Left Arm)   Pulse 62   Temp 97.8 F (36.6 C)   Resp 20   Ht 5\' 6"  (1.676 m)   Wt 78.9 kg   SpO2 100%   BMI 28.08 kg/m  Physical Exam Vitals and nursing note reviewed.  Constitutional:      Appearance: Normal appearance.     Comments: Appears fatigued  HENT:     Head: Normocephalic and atraumatic.     Nose: Congestion present.  Eyes:     Conjunctiva/sclera: Conjunctivae normal.  Cardiovascular:     Rate and Rhythm: Normal rate and regular rhythm.  Pulmonary:     Effort: Pulmonary effort is normal. No respiratory distress.     Breath sounds: Normal breath sounds.     Comments: Slightly increased work of breathing Abdominal:     General: There is no distension.     Palpations: Abdomen is soft.     Tenderness: There is no abdominal tenderness.  Skin:    General: Skin is warm and dry.  Neurological:     General: No focal deficit present.     Mental Status: He is alert.     ED Results / Procedures / Treatments   Labs (all labs ordered are listed, but only abnormal results are displayed) Labs Reviewed  RESP PANEL BY RT-PCR (FLU A&B, COVID) ARPGX2 - Abnormal; Notable for the following components:      Result Value   SARS Coronavirus 2 by RT PCR POSITIVE (*)    All other components within normal limits  D-DIMER, QUANTITATIVE - Abnormal; Notable for the following components:   D-Dimer, Quant 0.66 (*)    All other components within normal limits  BASIC METABOLIC PANEL - Abnormal; Notable for the following components:   Potassium 3.4 (*)    All other components within  normal limits  CBC    EKG EKG Interpretation  Date/Time:  Wednesday December 27 2021 14:50:09 EST Ventricular Rate:  59 PR Interval:  152 QRS Duration: 89 QT Interval:  447 QTC Calculation: 443 R Axis:   46 Text Interpretation: Sinus rhythm Borderline T abnormalities, diffuse leads t wave changes new since last tracing Confirmed by Linwood Dibbles [phone]) on 12/27/2021 4:36:53 PM  Radiology CT Angio Chest PE W and/or Wo Contrast  Result Date: 12/27/2021 CLINICAL DATA:  Pulmonary embolism (PE) suspected, positive D-dimer Cough and fatigue. EXAM: CT ANGIOGRAPHY CHEST WITH CONTRAST TECHNIQUE: Multidetector CT imaging of the chest was performed using the standard protocol during bolus administration of intravenous contrast. Multiplanar CT image reconstructions and MIPs were obtained to evaluate the vascular anatomy. RADIATION DOSE REDUCTION: This exam was performed according to the departmental dose-optimization program which includes automated exposure control, adjustment of the mA and/or kV according to patient size and/or use of iterative reconstruction technique. CONTRAST:  64mL OMNIPAQUE IOHEXOL 350 MG/ML SOLN COMPARISON:  Chest radiograph earlier today. Chest CT 05/30/2021. PET CT 09/29/2020 FINDINGS: Cardiovascular: Tiny filling defect consistent with pulmonary emboli within the subsegmental branches of the right lower lobe, series 4, image 82. Thromboembolic burden is minimal. Patient is post median sternotomy and CABG. Dense calcification of native coronary arteries. Pericardial soft tissue adjacent to the left ventricle measures approximately 7.5 x 1.3 cm, unchanged from prior exam, previously hypermetabolic on PET. There is no significant pericardial effusion. Atherosclerosis of the thoracic aorta, no acute aortic findings. Mediastinum/Nodes: No mediastinal or hilar adenopathy. No enlarged axillary nodes. No thyroid nodule. Small to moderate-sized hiatal hernia. Lungs/Pleura: No pulmonary infarct  slight central bronchial thickening without acute airspace disease. Minor scarring in the bilateral lung basis. No pleural effusion. Trachea and central airways are patent. Upper Abdomen: No acute or unexpected findings. Musculoskeletal: Median sternotomy. Thoracic spondylosis. There are no acute or suspicious osseous abnormalities. Review of the MIP images confirms the above findings. IMPRESSION: 1. Tiny subsegmental pulmonary emboli within the right lower lobe. Thromboembolic burden is minimal. 2. Mild central bronchial thickening without focal airspace disease. 3. Unchanged masslike pericardial soft tissue density adjacent to the left ventricle, previously hypermetabolic on PET. 4. Small to moderate-sized hiatal hernia. Aortic Atherosclerosis (ICD10-I70.0). Critical Value/emergent results were called by telephone at the time of interpretation on 12/27/2021 at 4:16 pm to provider [facility] Jacky Dross , who verbally acknowledged these results. Electronically Signed   By: Narda Rutherford M.D.   On: 12/27/2021 16:20   DG Chest 2 View  Result Date: 12/27/2021 CLINICAL DATA:  Cough, shortness of breath, exposure to COVID EXAM: CHEST - 2 VIEW COMPARISON:  07/20/2021 FINDINGS: Cardiac size is within normal limits. There is  previous coronary artery bypass surgery. There are no signs of pulmonary edema or focal pulmonary consolidation. There is no pleural effusion or pneumothorax. IMPRESSION: No active cardiopulmonary disease. Electronically Signed   By: Ernie Avena M.D.   On: 12/27/2021 12:21    Procedures Procedures    Medications Ordered in ED Medications  acetaminophen (TYLENOL) tablet 1,000 mg (1,000 mg Oral Given 12/27/21 1457)  iohexol (OMNIPAQUE) 350 MG/ML injection 100 mL (64 mLs Intravenous Contrast Given 12/27/21 1601)  apixaban (ELIQUIS) tablet 10 mg (10 mg Oral Given 12/27/21 1748)  apixaban Everlene Balls) Education Kit for DVT/PE patients ( Does not apply Given 12/27/21 1750)    ED Course/ Medical  Decision Making/ A&P                           Medical Decision Making Amount and/or Complexity of Data Reviewed Labs: ordered. Radiology: ordered.  Risk OTC drugs. Prescription drug management.  This patient is a 81 y.o. male who presents to the ED for concern of fatigue and shortness of breath, this involves an extensive number of treatment options, and is a complaint that carries with it a high risk of complications and morbidity. The emergent differential diagnosis prior to evaluation includes, but is not limited to,  CHF, pericardial effusion/tamponade, arrhythmias, ACS, COPD, asthma, bronchitis, pneumonia, pneumothorax, PE, anemia. This is not an exhaustive differential.   Past Medical History / Co-morbidities / Social History: hypertension, hyperlipidemia, stroke in 2018, CAD s/p CABG x4 in 2019  Wife tested positive for COVID-19 last week.  Additional history: Chart reviewed. Pertinent results include: normal EF per echocardiogram in June 2023  Physical Exam: Physical exam performed. The pertinent findings include: Normal vital signs, normal oxygen saturation on room air.  Slightly increased work of breathing, but clear lung sounds.  Patient appears fatigued.  Lab Tests: I ordered, and personally interpreted labs.  The pertinent results include: No leukocytosis, normal hemoglobin.  Potassium 3.4, otherwise BMP unremarkable.  Positive for COVID.  D-dimer 0.66.   Imaging Studies: I ordered imaging studies including chest x-ray and CT PE study. I independently visualized and interpreted imaging which showed x-ray with no acute cardiopulmonary abnormalities, CT with tiny subsegmental pulmonary emboli within right lower lobe, minimal thromboembolic burden.. I agree with the radiologist interpretation.   Cardiac Monitoring:  The patient was maintained on a cardiac monitor.  My attending physician Dr. Lynelle Doctor viewed and interpreted the cardiac monitored which showed an underlying  rhythm of: Sinus rhythm with borderline T wave changes. I agree with this interpretation.   Medications: I ordered medication including Tylenol and Eliquis for headache and pulmonary embolus. Reevaluation of the patient after these medicines showed that the patient improved. I have reviewed the patients home medicines and have made adjustments as needed.  Consultations Obtained: I requested consultation with the hospitalist Dr. Jerral Ralph,  and discussed lab and imaging findings as well as pertinent plan - they recommend: reevaluating the patient's respiratory status prior to admitting. Explained that with the patient's complex cardiac history, I think he would benefit from admission.    Had a shared decision-making conversation with the patient and his family members about admission versus discharge to home on Eliquis.  They feel more comfortable with the patient going home, and the son ensures that he will help the patient with the Eliquis dosing.  I think this is reasonable as patient is hemodynamically stable, in no acute distress, and there is low thromboembolic burden on his  CT scan.   Disposition: After consideration of the diagnostic results and the patients response to treatment, I feel that emergency department workup does not suggest an emergent condition requiring admission or immediate intervention beyond what has been performed at this time. The plan is: Charged home on Eliquis starter pack.  Initially had plan to discharge the patient on Paxlovid for COVID, but consulted with ED pharmacist who advised that this is contraindicated with Eliquis due to increased risk of bleeding.. The patient is safe for discharge and has been instructed to return immediately for worsening symptoms, change in symptoms or any other concerns.  I discussed this case with my attending physician Dr. Lynelle Doctor who cosigned this note including patient's presenting symptoms, physical exam, and planned diagnostics and  interventions. Attending physician stated agreement with plan or made changes to plan which were implemented.    Final Clinical Impression(s) / ED Diagnoses Final diagnoses:  COVID-19  Single subsegmental pulmonary embolism without acute cor pulmonale (HCC)    Rx / DC Orders ED Discharge Orders          Ordered     12/27/21 1716    APIXABAN (ELIQUIS) VTE STARTER PACK (10MG  AND 5MG )        12/27/21 1910           Portions of this report may have been transcribed using voice recognition software. Every effort was made to ensure accuracy; however, inadvertent computerized transcription errors may be present.    Jeanella Flattery 12/28/21 1203    Lonell Grandchild, MD 12/28/21 1430

## 2021-12-27 NOTE — Discharge Instructions (Addendum)
You were seen in the emergency department for symptoms related to COVID-19.  As we discussed, on the scan of your chest you have a very small blood clot in your right lung. We will treat this with a blood thinner called Eliquis, for at least 1 month.  Unfortunately, the anti-viral medication for COVID called Paxlovid is contraindicated with the blood thinner as it causes a severely increased risk of bleeding.   However, we can treat the other symptoms of COVID with over the counter medications like tylenol for pain and fever, and decongestants as needed.  I want you to follow up with your cardiologist as soon as possible to evaluate you while on the blood thinner, and return to the ER immediately for any new or worsening symptoms.

## 2021-12-27 NOTE — ED Triage Notes (Signed)
Pt c/o cough, headache, and fatigue for the past several days. His wife had the same symptoms and recently tested positive for Covid.

## 2021-12-27 NOTE — ED Notes (Signed)
Patient transported to CT 

## 2021-12-29 ENCOUNTER — Telehealth: Payer: Self-pay | Admitting: Cardiovascular Disease

## 2021-12-29 NOTE — Telephone Encounter (Signed)
Returned call to patient who states that "they didn't give medicine." He asks that I call his son [name]. Spoke with son listed on DPR and son states that the pharmacy didn't have Eliquis available for pick up until today and states at time of call, he's on his way to go get from pharmacy. Son says they were told he should have a follow-up appt in 30 days with cardiology to determine continuation of Eliquis. Pt previously had 01/19/22 appt as scheduled 6 month follow-up, but rescheduled for 01/30/22 per request by son to follow hospital recommendations. Pt was diagnosed with COVID (wife has COVID) and this was what drove him to the ED initially. ED notes from 12/27/21:  CT with tiny subsegmental pulmonary emboli within right lower lobe, minimal thromboembolic burden.. I agree with the radiologist interpretation.  Medications: I ordered medication including Tylenol and Eliquis for headache and pulmonary embolus. Reevaluation of the patient after these medicines showed that the patient improved. I have reviewed the patients home medicines and have made adjustments as needed.

## 2021-12-29 NOTE — Telephone Encounter (Signed)
Pt would like a callback from nurse regarding resent ED visit. Please advise

## 2022-01-19 ENCOUNTER — Ambulatory Visit: Payer: Medicare HMO | Admitting: Cardiovascular Disease

## 2022-01-30 ENCOUNTER — Ambulatory Visit: Payer: Medicare HMO | Attending: Cardiovascular Disease | Admitting: Cardiovascular Disease

## 2022-01-30 ENCOUNTER — Encounter: Payer: Self-pay | Admitting: Cardiovascular Disease

## 2022-01-30 VITALS — BP 150/90 | HR 65 | Ht 66.0 in | Wt 174.2 lb

## 2022-01-30 DIAGNOSIS — I2699 Other pulmonary embolism without acute cor pulmonale: Secondary | ICD-10-CM | POA: Diagnosis not present

## 2022-01-30 DIAGNOSIS — E785 Hyperlipidemia, unspecified: Secondary | ICD-10-CM | POA: Diagnosis not present

## 2022-01-30 DIAGNOSIS — I2510 Atherosclerotic heart disease of native coronary artery without angina pectoris: Secondary | ICD-10-CM

## 2022-01-30 DIAGNOSIS — I5032 Chronic diastolic (congestive) heart failure: Secondary | ICD-10-CM

## 2022-01-30 DIAGNOSIS — I10 Essential (primary) hypertension: Secondary | ICD-10-CM | POA: Diagnosis not present

## 2022-01-30 MED ORDER — AMLODIPINE BESYLATE 5 MG PO TABS
5.0000 mg | ORAL_TABLET | Freq: Every day | ORAL | 3 refills | Status: DC
Start: 2022-01-30 — End: 2022-08-08

## 2022-01-30 MED ORDER — ROSUVASTATIN CALCIUM 40 MG PO TABS: 40.0000 mg | ORAL_TABLET | Freq: Every day | ORAL | 3 refills | Status: DC

## 2022-01-30 MED ORDER — APIXABAN 5 MG PO TABS: 5.0000 mg | ORAL_TABLET | Freq: Two times a day (BID) | ORAL | 5 refills | Status: DC

## 2022-01-30 NOTE — Progress Notes (Unsigned)
Cardiology Office Note:    Date:  01/30/2022   ID:  [name], DOB 06/04/1940, MRN [id number]  PCP:  Horald Pollen, [place] Providers Cardiologist:  Sherren Mocha, MD     Referring MD: Horald Pollen, *   Chief Complaint  Patient presents with   Coronary Artery Disease    History of Present Illness:    [name] is a 81 y.o. male with a hx of coronary artery disease status post CABG 2019, presenting follow-up evaluation.  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  A PET scan was ultimately done which demonstrated a hypermetabolic soft tissue thickening along the lateral wall of the left ventricle within the pericardial space.  After a shared decision making conversation, we elected on imaging surveillance.  In recent years has had a difficult time with leg weakness and fatigue.  He was evaluated last month at [facility] emergency department with cough and shortness of breath.  He tested positive for COVID but also was found to have small subsegmental pulmonary emboli and was started on apixaban starter pack.  He continues to have leg weakness and fatigue but denies chest pain or shortness of breath with his normal activities.  Past Medical History:  Diagnosis Date   Blurred vision 05/17/2016   CAD (coronary artery disease), native coronary artery 06/14/2008   a. BMS to LCx & OM 2008 b. 03/2017 CABG x 4 (LIMA to LAD, SVG to diag 1, SVG to distal Circ, SVG to PDA)   Chest pain 04/16/2012   Diplopia    Echocardiogram    Echo 10/19: mod LVH, EF 55-60, no RWMA, Gr 1 DD, trivial AI, MAC, mod LAE, prob small post effusion   Essential hypertension 05/17/2016   Hyperlipidemia with target low density lipoprotein (LDL) cholesterol less than 70 mg/dL 06/14/2008   Qualifier: Diagnosis of  By: Mare Ferrari, RMA, [name]      Hypertension    Hypertensive heart disease 06/14/2008   Qualifier: Diagnosis of  By: Mare Ferrari, RMA, [name]     Hypertensive urgency, malignant 04/16/2012   Hypokalemia 04/14/2017   Internal hemorrhoids 04/19/2012   Ischemic stroke ([place]) 05/02/2016   Non-ST elevation (NSTEMI) myocardial infarction [facility])    pandiverticulosis 04/22/2012   12/17/2011. [place]. Juanita Craver MD. Colonoscopy. Moderate sized internal hemorrhoids and extensive pandiverticulosis. Repeat 5 years.     Pericardial effusion    Echo 11/19: mild focal basal septal hypertrophy, EF 55-60, Gr 2 DD, trivial AI, trivial TR, trivial PI, small to mod eff post to heart - no evidence of RV collapse.>> repeat limited echo in 03/2018 // Echo 03/2018: EF 55-60, mild LVH, +diastolic dysfunction, no RWMA, PASP 23, trivial pericardial effusion    Polycythemia vera(238.4) 04/17/2012   S/P CABG x 4 04/17/2017   Syncope and collapse 04/17/2012   Pt syncopized while sitting in bed giving history @ time of admission to hospital Tachycardic (appeared sinus) to 120s-130s and hypotensive to 50s/30s.  Unresponsive initially >> Spontaneously resolved after 2-3 minutes >> return to baseline ~30 minutes    Vertigo     Past Surgical History:  Procedure Laterality Date   CORONARY ARTERY BYPASS GRAFT N/A 04/17/2017   Procedure: CORONARY ARTERY BYPASS GRAFTING (CABG) x4 , using left internal mammary artery  to LAD and right leg greater saphenous vein harvested endoscopically  to PDA, Diagonal I and Circumflex;  Surgeon: Lanelle Bal  B, MD;  Location: [place];  Service: Open Heart Surgery;  Laterality: N/A;   CORONARY ARTERY BYPASS GRAFT  2020   CORONARY STENT PLACEMENT     LEFT HEART CATH AND CORONARY ANGIOGRAPHY N/A 04/16/2017   Procedure: LEFT HEART CATH AND CORONARY ANGIOGRAPHY;  Surgeon: Sherren Mocha, MD;  Location: [place] [facility];  Service: Cardiovascular;  Laterality: N/A;   TEE WITHOUT CARDIOVERSION N/A 04/17/2017   Procedure:  TRANSESOPHAGEAL ECHOCARDIOGRAM (TEE);  Surgeon: [name], MD;  Location: [place];  Service: Open Heart Surgery;  Laterality: N/A;    Current Medications: Current Meds  Medication Sig   acetaminophen (TYLENOL) 325 MG tablet Take 325 mg by mouth daily.   amLODipine (NORVASC) 5 MG tablet Take 1 tablet (5 mg total) by mouth daily.   apixaban (ELIQUIS) 5 MG TABS tablet Take 1 tablet (5 mg total) by mouth 2 (two) times daily.   aspirin EC 81 MG tablet Take 81 mg by mouth daily. Swallow whole.   carvedilol (COREG) 12.5 MG tablet Take 1 tablet (12.5 mg total) by mouth 2 (two) times daily.   pantoprazole (PROTONIX) 40 MG tablet Take 1 tablet (40 mg total) by mouth daily.   valsartan (DIOVAN) 160 MG tablet Take 1 tablet (160 mg total) by mouth daily.   [DISCONTINUED] amLODipine (NORVASC) 2.5 MG tablet Take 1 tablet (2.5 mg total) by mouth daily.   [DISCONTINUED] APIXABAN (ELIQUIS) VTE STARTER PACK (10MG AND 5MG) Take as directed on package: start with two-60m tablets twice daily for 7 days. On day 8, switch to one-557mtablet twice daily.   [DISCONTINUED] rosuvastatin (CRESTOR) 40 MG tablet Take 1 tablet (40 mg total) by mouth daily.     Allergies:   Lipitor [atorvastatin], Morphine and related, and Pork-derived products   Social History   Socioeconomic History   Marital status: Married    Spouse name: Not on file   Number of children: Not on file   Years of education: Not on file   Highest education level: Not on file  Occupational History   Not on file  Tobacco Use   Smoking status: Never   Smokeless tobacco: Never  Vaping Use   Vaping Use: Never used  Substance and Sexual Activity   Alcohol use: No    Alcohol/week: 0.0 standard drinks of alcohol   Drug use: No   Sexual activity: Not on file  Other Topics Concern   Not on file  Social History Narrative   [place].  [id number]     Currently retired but [place] in [place](09/18/2021[place]   Overall Financial Resource Strain (CARDIA)    Difficulty of Paying Living Expenses: Not hard at all  Food Insecurity: No Food Insecurity (09/18/2021)   Hunger Vital Sign    Worried About Running Out of Food in the Last Year: Never true    [place] the Last Year: Never true  Transportation Needs: No Transportation Needs (09/18/2021)   PRAPARE - TrHydrologistMedical): No    Lack of Transportation (Non-Medical): No  Physical Activity: Sufficiently Active (09/18/2021)   Exercise Vital Sign    Days of Exercise per Week: 5 days    Minutes of Exercise per Session: 30 min  Stress: No Stress Concern Present (09/18/2021)   [employer] Occupational  Health - Occupational Stress Questionnaire    Feeling of Stress : Not at all  Social Connections: Socially Integrated (09/18/2021)   Social Connection and Isolation Panel [NHANES]    Frequency of Communication with Friends and Family: More than three times a week    Frequency of Social Gatherings with Friends and Family: Three times a week    Attends Religious Services: More than 4 times per year    Active Member of Clubs or Organizations: No    Attends Music therapist: More than 4 times per year    Marital Status: Married     Family History: The patient's family history includes Cancer in his sister; Heart disease in his mother.  ROS:   Please see the history of present illness.    All other systems reviewed and are negative.  EKGs/Labs/Other Studies Reviewed:    The following studies were reviewed today: CTA Chest 12/27/2021:  IMPRESSION: 1. Tiny subsegmental pulmonary emboli within the right lower lobe. Thromboembolic burden is minimal. 2. Mild central bronchial thickening without focal airspace disease. 3. Unchanged masslike pericardial soft tissue density adjacent to the left  ventricle, previously hypermetabolic on PET. 4. Small to moderate-sized hiatal hernia.   Aortic Atherosclerosis (ICD10-I70.0).  Echo 08/09/2021: 1. Left ventricular ejection fraction, by estimation, is 55 to 60%. The  left ventricle has normal function. The left ventricle has no regional  wall motion abnormalities. There is mild concentric left ventricular  hypertrophy. Left ventricular diastolic  parameters were normal.   2. Right ventricular systolic function is normal. The right ventricular  size is normal.   3. Left atrial size was moderately dilated.   4. Known pericardial mass not well visualized on current study.   5. The mitral valve is grossly normal. Trivial mitral valve  regurgitation. No evidence of mitral stenosis.   6. The aortic valve is grossly normal. There is mild calcification of the  aortic valve. Aortic valve regurgitation is trivial. Aortic valve  sclerosis is present, with no evidence of aortic valve stenosis.   7. The inferior vena cava is normal in size with greater than 50%  respiratory variability, suggesting right atrial pressure of 3 mmHg.   Comparison(s): No significant change from prior study.   Conclusion(s)/Recommendation(s): Otherwise normal echocardiogram, with  minor abnormalities described in the report.   EKG:  EKG is not ordered today.    Recent Labs: 07/20/2021: ALT 21 12/27/2021: BUN 13; Creatinine, Ser 0.82; Hemoglobin 14.8; Platelets 199; Potassium 3.4; Sodium 140  Recent Lipid Panel    Component Value Date/Time   CHOL 111 03/07/2021 1145   CHOL 105 08/14/2019 0956   TRIG 102.0 03/07/2021 1145   HDL 39.10 03/07/2021 1145   HDL 37 (L) 08/14/2019 0956   CHOLHDL 3 03/07/2021 1145   VLDL 20.4 03/07/2021 1145   LDLCALC 51 03/07/2021 1145   LDLCALC 50 08/14/2019 0956   LDLDIRECT 150.8 06/30/2008 1007     Risk Assessment/Calculations:      HYPERTENSION CONTROL Vitals:   01/30/22 1546 01/30/22 1616  BP: (!) 170/100 (!) 150/90     The patient's blood pressure is elevated above target today. {Click here if intervention needs to be changed Refresh Note :1}  In order to address the patient's elevated BP: A current anti-hypertensive medication was adjusted today.            Physical Exam:    VS:  BP (!) 150/90   Pulse 65   Ht _0  (1.676 m)  Wt 174 lb 3.2 oz (79 kg)   SpO2 97%   BMI 28.12 kg/m     Wt Readings from Last 3 Encounters:  01/30/22 174 lb 3.2 oz (79 kg)  12/27/21 174 lb (78.9 kg)  09/18/21 179 lb 9.6 oz (81.5 kg)     GEN:  Well nourished, well developed pleasant elderly male in no acute distress HEENT: Normal NECK: No JVD; No carotid bruits LYMPHATICS: No lymphadenopathy CARDIAC: RRR, no murmurs, rubs, gallops RESPIRATORY:  Clear to auscultation without rales, wheezing or rhonchi  ABDOMEN: Soft, non-tender, non-distended MUSCULOSKELETAL:  No edema; No deformity  SKIN: Warm and dry NEUROLOGIC:  Alert and oriented x 3 PSYCHIATRIC:  Normal affect   ASSESSMENT:    1. Chronic diastolic heart failure ([place])   2. Dyslipidemia   3. Coronary artery disease involving native coronary artery of native heart without angina pectoris   4. Other pulmonary embolism without acute cor pulmonale, unspecified chronicity ([place])   5. Essential hypertension    PLAN:    In order of problems listed above:  Clinically stable.  Continue valsartan and carvedilol.  Not on diuretic therapy.  Appears euvolemic on exam. Treated with rosuvastatin.  Refill written today.  LDL cholesterol on last check is 51 mg/dL. Stable without symptoms of angina, treated with a beta-blocker and high intensity statin drug as well as aspirin 81 mg daily. Reviewed ER note and chest CT result.  He had tiny subsegmental pulmonary emboli.  I will refill his apixaban until he has primary care follow-up.  I would not think that he would require oral anticoagulation for greater than 6 months. On my recheck his blood pressure is 150/90  mmHg.  Increase amlodipine from 2.5 up to 5 mg daily.  Arrange APP follow-up in about 4 to 6 weeks.           Medication Adjustments/Labs and Tests Ordered: Current medicines are reviewed at length with the patient today.  Concerns regarding medicines are outlined above.  No orders of the defined types were placed in this encounter.  Meds ordered this encounter  Medications   rosuvastatin (CRESTOR) 40 MG tablet    Sig: Take 1 tablet (40 mg total) by mouth daily.    Dispense:  90 tablet    Refill:  3   apixaban (ELIQUIS) 5 MG TABS tablet    Sig: Take 1 tablet (5 mg total) by mouth 2 (two) times daily.    Dispense:  60 tablet    Refill:  5   amLODipine (NORVASC) 5 MG tablet    Sig: Take 1 tablet (5 mg total) by mouth daily.    Dispense:  90 tablet    Refill:  3    Patient Instructions  Medication Instructions:  INCREASE Amlodipine to 44m daily CONTINUE Eliquis 540mtwice daily for 6 months REFILLED Rosuvastatin **DO NOT TAKE Clopidogrel (plavix)** *If you need a refill on your cardiac medications before your next appointment, please call your pharmacy*   Lab Work: NONE If you have labs (blood work) drawn today and your tests are completely normal, you will receive your results only by: [place]if you have MyChart) OR A paper copy in the mail If you have any lab test that is abnormal or we need to change your treatment, we will call you to review the results.   Testing/Procedures: NONE   Follow-Up: [facility].  As part of our continuing mission to provide  you with exceptional heart care, we have created designated Provider Care Teams.  These Care Teams include your primary Cardiologist (physician) and Advanced Practice Providers (APPs -  Physician Assistants and Nurse Practitioners) who all work together to provide you with the care you need, when you need it.  We recommend signing up for the patient  portal called "MyChart".  Sign up information is provided on this After Visit Summary.  MyChart is used to connect with patients for Virtual Visits (Telemedicine).  Patients are able to view lab/test results, encounter notes, upcoming appointments, etc.  Non-urgent messages can be sent to your provider as well.   To learn more about what you can do with MyChart, go to [email or web address].    Your next appointment:   02/27/22 @ 9:15am  The format for your next appointment:   In Person  Provider:   Estella Husk, [place]         Signed, Sherren Mocha, MD  01/30/2022 4:20 PM    [place]

## 2022-01-30 NOTE — Patient Instructions (Signed)
Medication Instructions:  INCREASE Amlodipine to '5mg'$  daily CONTINUE Eliquis '5mg'$  twice daily for 6 months REFILLED Rosuvastatin **DO NOT TAKE Clopidogrel (plavix)** *If you need a refill on your cardiac medications before your next appointment, please call your pharmacy*   Lab Work: NONE If you have labs (blood work) drawn today and your tests are completely normal, you will receive your results only by: [place] (if you have MyChart) OR A paper copy in the mail If you have any lab test that is abnormal or we need to change your treatment, we will call you to review the results.   Testing/Procedures: NONE   Follow-Up: At [facility], you and your health needs are our priority.  As part of our continuing mission to provide you with exceptional heart care, we have created designated Provider Care Teams.  These Care Teams include your primary Cardiologist (physician) and Advanced Practice Providers (APPs -  Physician Assistants and Nurse Practitioners) who all work together to provide you with the care you need, when you need it.  We recommend signing up for the patient portal called "MyChart".  Sign up information is provided on this After Visit Summary.  MyChart is used to connect with patients for Virtual Visits (Telemedicine).  Patients are able to view lab/test results, encounter notes, upcoming appointments, etc.  Non-urgent messages can be sent to your provider as well.   To learn more about what you can do with MyChart, go to [email or web address].    Your next appointment:   02/27/22 @ 9:15am  The format for your next appointment:   In Person  Provider:   Estella Husk, PA       Important Information About Sugar

## 2022-02-15 NOTE — Progress Notes (Signed)
Cardiology Office Note:    Date:  02/27/2022   ID:  [name], DOB December 10, 1940, MRN [id number]  PCP:  Horald Pollen, [place] Providers Cardiologist:  Sherren Mocha, MD     Referring MD: Horald Pollen, *   Chief Complaint:  Dizziness     History of Present Illness:   [name] is a 81 y.o. male with a hx of coronary artery disease status post CABG 2019Comorbid conditions include pericardial mass found postoperatively that was evaluated with multimodality imaging including CAT scan and cardiac MRI studies.  A PET scan was ultimately done which demonstrated a hypermetabolic soft tissue thickening along the lateral wall of the left ventricle within the pericardial space.  After a shared decision making conversation, we elected on imaging surveillance.  In recent years has had a difficult time with leg weakness and fatigue.    He was evaluated 12/2021 at [facility] emergency department with cough and shortness of breath.  He tested positive for COVID but also was found to have small subsegmental pulmonary emboli and was started on apixaban starter pack.     Patient saw Dr. Burt Knack 01/30/22 and BP high. Amlodipine increased 5 mg daily and now here for f/u.  Patient comes in with his wife. Yesterday when walking out of the house he fell down. Didn't pass out. Feels very weak. When I laid him down for orthostatics he could hardly walk to exam table-holding on-now dizzy. His wife says he was fine driving here. Now his head hurts. Denies bleeding problems.      Past Medical History:  Diagnosis Date   Blurred vision 05/17/2016   CAD (coronary artery disease), native coronary artery 06/14/2008   a. BMS to LCx & OM 2008 b. 03/2017 CABG x 4 (LIMA to LAD, SVG to diag 1, SVG to distal Circ, SVG to PDA)   Chest pain 04/16/2012   Diplopia    Echocardiogram    Echo 10/19: mod LVH, EF 55-60, no RWMA, Gr 1 DD, trivial AI, MAC, mod LAE, prob small post  effusion   Essential hypertension 05/17/2016   Hyperlipidemia with target low density lipoprotein (LDL) cholesterol less than 70 mg/dL 06/14/2008   Qualifier: Diagnosis of  By: Mare Ferrari, RMA, [name]     Hypertension    Hypertensive heart disease 06/14/2008   Qualifier: Diagnosis of  By: Mare Ferrari, RMA, [name]     Hypertensive urgency, malignant 04/16/2012   Hypokalemia 04/14/2017   Internal hemorrhoids 04/19/2012   Ischemic stroke ([place]) 05/02/2016   Non-ST elevation (NSTEMI) myocardial infarction [facility])    pandiverticulosis 04/22/2012   12/17/2011. [place]. Juanita Craver MD. Colonoscopy. Moderate sized internal hemorrhoids and extensive pandiverticulosis. Repeat 5 years.     Pericardial effusion    Echo 11/19: mild focal basal septal hypertrophy, EF 55-60, Gr 2 DD, trivial AI, trivial TR, trivial PI, small to mod eff post to heart - no evidence of RV collapse.>> repeat limited echo in 03/2018 // Echo 03/2018: EF 55-60, mild LVH, +diastolic dysfunction, no RWMA, PASP 23, trivial pericardial effusion    Polycythemia vera(238.4) 04/17/2012   S/P CABG x 4 04/17/2017   Syncope and collapse 04/17/2012   Pt syncopized while sitting in bed giving history @ time of admission to hospital Tachycardic (appeared sinus) to 120s-130s and hypotensive to 50s/30s.  Unresponsive initially >> Spontaneously resolved after 2-3 minutes >> return to baseline ~30 minutes    Vertigo    Current Medications: Current Meds  Medication  Sig   acetaminophen (TYLENOL) 325 MG tablet Take 325 mg by mouth daily.   amLODipine (NORVASC) 5 MG tablet Take 1 tablet (5 mg total) by mouth daily.   apixaban (ELIQUIS) 5 MG TABS tablet Take 1 tablet (5 mg total) by mouth 2 (two) times daily.   carvedilol (COREG) 12.5 MG tablet Take 1 tablet (12.5 mg total) by mouth 2 (two) times daily.   pantoprazole (PROTONIX) 40 MG tablet Take 1 tablet (40 mg total) by mouth daily.   rosuvastatin (CRESTOR) 40 MG tablet Take 1 tablet (40 mg total) by  mouth daily.   valsartan (DIOVAN) 160 MG tablet Take 1 tablet (160 mg total) by mouth daily.   [DISCONTINUED] aspirin EC 81 MG tablet Take 81 mg by mouth daily. Swallow whole.    Allergies:   Lipitor [atorvastatin], Morphine and related, and Pork-derived products   Social History   Tobacco Use   Smoking status: Never   Smokeless tobacco: Never  Vaping Use   Vaping Use: Never used  Substance Use Topics   Alcohol use: No    Alcohol/week: 0.0 standard drinks of alcohol   Drug use: No    Family Hx: The patient's family history includes Cancer in his sister; Heart disease in his mother.  ROS     Physical Exam:    VS:  BP 130/70   Pulse 77   Ht _0  (1.676 m)   Wt 172 lb 12.8 oz (78.4 kg)   SpO2 98%   BMI 27.89 kg/m     Wt Readings from Last 3 Encounters:  02/27/22 172 lb 12.8 oz (78.4 kg)  01/30/22 174 lb 3.2 oz (79 kg)  12/27/21 174 lb (78.9 kg)    Physical Exam  GEN: Well nourished, well developed, in no acute distress  Neck: no JVD, carotid bruits, or masses Cardiac:RRR; no murmurs, rubs, or gallops  Respiratory:  clear to auscultation bilaterally, normal work of breathing GI: soft, nontender, nondistended, + BS Ext: without cyanosis, clubbing, or edema, Good distal pulses bilaterally Neuro:  Alert and Oriented x 3,  Psych: euthymic mood, full affect        EKGs/Labs/Other Test Reviewed:    EKG:  EKG is  not ordered today.     Recent Labs: 07/20/2021: ALT 21 12/27/2021: BUN 13; Creatinine, Ser 0.82; Hemoglobin 14.8; Platelets 199; Potassium 3.4; Sodium 140   Recent Lipid Panel Recent Labs    03/07/21 1145  CHOL 111  TRIG 102.0  HDL 39.10  VLDL 20.4  LDLCALC 51     Prior CV Studies:   CTA Chest 12/27/2021:  IMPRESSION: 1. Tiny subsegmental pulmonary emboli within the right lower lobe. Thromboembolic burden is minimal. 2. Mild central bronchial thickening without focal airspace disease. 3. Unchanged masslike pericardial soft tissue density  adjacent to the left ventricle, previously hypermetabolic on PET. 4. Small to moderate-sized hiatal hernia.   Aortic Atherosclerosis (ICD10-I70.0).   Echo 08/09/2021: 1. Left ventricular ejection fraction, by estimation, is 55 to 60%. The  left ventricle has normal function. The left ventricle has no regional  wall motion abnormalities. There is mild concentric left ventricular  hypertrophy. Left ventricular diastolic  parameters were normal.   2. Right ventricular systolic function is normal. The right ventricular  size is normal.   3. Left atrial size was moderately dilated.   4. Known pericardial mass not well visualized on current study.   5. The mitral valve is grossly normal. Trivial mitral valve  regurgitation. No evidence of mitral  stenosis.   6. The aortic valve is grossly normal. There is mild calcification of the  aortic valve. Aortic valve regurgitation is trivial. Aortic valve  sclerosis is present, with no evidence of aortic valve stenosis.   7. The inferior vena cava is normal in size with greater than 50%  respiratory variability, suggesting right atrial pressure of 3 mmHg.   Comparison(s): No significant change from prior study.   Conclusion(s)/Recommendation(s): Otherwise normal echocardiogram, with  minor abnormalities described in the report.      Risk Assessment/Calculations/Metrics:              ASSESSMENT & PLAN:   No problem-specific Assessment & Plan notes found for this encounter.   HTN-BP now controled but patient weak, dizzy, off balance, headache. Not orthostatic. Advised patient he needs to go to ED but he refuses. Will do blood work. His wife will drive him home and he says if he's not feeling better he'll have his son take him to ED. Extensive review of chart indicates he's had frequent falls since November. He needs to f/u with PCP and possibly see neurology.  Chronic diastolic CHF-compensated  CAD CABG 2019-no angina-stop ASA while on  eliquis  PE on eliquis and ASA. Will stop ASA while on Eliquis and check labs including CBC. He denies bleeding.   HLD on rosuvastatin            Dispo:  No follow-ups on file.   Medication Adjustments/Labs and Tests Ordered: Current medicines are reviewed at length with the patient today.  Concerns regarding medicines are outlined above.  Tests Ordered: Orders Placed This Encounter  Procedures   CBC   Comprehensive metabolic panel   TSH   Medication Changes: No orders of the defined types were placed in this encounter.  Sumner Boast, PA-C  02/27/2022 10:25 AM    [address], [place], [place]  [id number] Phone: [phone]; Fax: [phone]

## 2022-02-27 ENCOUNTER — Ambulatory Visit: Payer: Medicare HMO | Attending: Physician Assistant | Admitting: Physician Assistant

## 2022-02-27 ENCOUNTER — Encounter: Payer: Self-pay | Admitting: Physician Assistant

## 2022-02-27 VITALS — BP 130/70 | HR 77 | Ht 66.0 in | Wt 172.8 lb

## 2022-02-27 DIAGNOSIS — E782 Mixed hyperlipidemia: Secondary | ICD-10-CM | POA: Diagnosis not present

## 2022-02-27 DIAGNOSIS — R5383 Other fatigue: Secondary | ICD-10-CM | POA: Diagnosis not present

## 2022-02-27 DIAGNOSIS — I5032 Chronic diastolic (congestive) heart failure: Secondary | ICD-10-CM

## 2022-02-27 DIAGNOSIS — I2510 Atherosclerotic heart disease of native coronary artery without angina pectoris: Secondary | ICD-10-CM

## 2022-02-27 DIAGNOSIS — I10 Essential (primary) hypertension: Secondary | ICD-10-CM

## 2022-02-27 NOTE — Patient Instructions (Addendum)
Medication Instructions:  1.Stop aspirin 81 mg *If you need a refill on your cardiac medications before your next appointment, please call your pharmacy*   Lab Work: CBC, CMET, and TSH today If you have labs (blood work) drawn today and your tests are completely normal, you will receive your results only by: [place] (if you have MyChart) OR A paper copy in the mail If you have any lab test that is abnormal or we need to change your treatment, we will call you to review the results.   Follow-Up: At [facility], you and your health needs are our priority.  As part of our continuing mission to provide you with exceptional heart care, we have created designated Provider Care Teams.  These Care Teams include your primary Cardiologist (physician) and Advanced Practice Providers (APPs -  Physician Assistants and Nurse Practitioners) who all work together to provide you with the care you need, when you need it.   Your next appointment:   6 month(s)  The format for your next appointment:   In Person  Provider:   Sherren Mocha, MD    Other Instructions Your provider recommends that your report to the ER and also call and schedule a follow up with your primary care provider.  Important Information About Sugar

## 2022-02-28 LAB — COMPREHENSIVE METABOLIC PANEL
ALT: 10 IU/L (ref 0–44)
AST: 13 IU/L (ref 0–40)
Albumin/Globulin Ratio: 1.4 (ref 1.2–2.2)
Albumin: 4.1 g/dL (ref 3.8–4.8)
Alkaline Phosphatase: 99 IU/L (ref 44–121)
BUN/Creatinine Ratio: 17 (ref 10–24)
BUN: 15 mg/dL (ref 8–27)
Bilirubin Total: 0.6 mg/dL (ref 0.0–1.2)
CO2: 24 mmol/L (ref 20–29)
Calcium: 9.4 mg/dL (ref 8.6–10.2)
Chloride: 105 mmol/L (ref 96–106)
Creatinine, Ser: 0.87 mg/dL (ref 0.76–1.27)
Globulin, Total: 2.9 g/dL (ref 1.5–4.5)
Glucose: 105 mg/dL — ABNORMAL HIGH (ref 70–99)
Potassium: 4 mmol/L (ref 3.5–5.2)
Sodium: 143 mmol/L (ref 134–144)
Total Protein: 7 g/dL (ref 6.0–8.5)
eGFR: 88 mL/min/{1.73_m2} (ref >59)

## 2022-02-28 LAB — CBC
Hematocrit: 44.7 % (ref 37.5–51.0)
Hemoglobin: 14.8 g/dL (ref 13.0–17.7)
MCH: 29.2 pg (ref 26.6–33.0)
MCHC: 33.1 g/dL (ref 31.5–35.7)
MCV: 88 fL (ref 79–97)
Platelets: 244 10*3/uL (ref 150–450)
RBC: 5.06 x10E6/uL (ref 4.14–5.80)
RDW: 14.8 % (ref 11.6–15.4)
WBC: 8.8 10*3/uL (ref 3.4–10.8)

## 2022-02-28 LAB — TSH: TSH: 2.11 u[IU]/mL (ref 0.450–4.500)

## 2022-03-07 ENCOUNTER — Ambulatory Visit (INDEPENDENT_AMBULATORY_CARE_PROVIDER_SITE_OTHER): Payer: Medicare HMO | Admitting: Emergency Medicine

## 2022-03-07 ENCOUNTER — Encounter: Payer: Self-pay | Admitting: Emergency Medicine

## 2022-03-07 VITALS — BP 122/72 | HR 62 | Temp 97.8°F | Ht 66.0 in | Wt 174.4 lb

## 2022-03-07 DIAGNOSIS — I2693 Single subsegmental pulmonary embolism without acute cor pulmonale: Secondary | ICD-10-CM

## 2022-03-07 DIAGNOSIS — I10 Essential (primary) hypertension: Secondary | ICD-10-CM

## 2022-03-07 DIAGNOSIS — I25118 Atherosclerotic heart disease of native coronary artery with other forms of angina pectoris: Secondary | ICD-10-CM

## 2022-03-07 DIAGNOSIS — I700 Atherosclerosis of aorta: Secondary | ICD-10-CM

## 2022-03-07 DIAGNOSIS — Z23 Encounter for immunization: Secondary | ICD-10-CM

## 2022-03-07 DIAGNOSIS — E785 Hyperlipidemia, unspecified: Secondary | ICD-10-CM | POA: Diagnosis not present

## 2022-03-07 NOTE — Assessment & Plan Note (Signed)
Clinically stable.  No red flag signs or symptoms. Diagnosed last November 2023 Tolerating Eliquis well.  No clinical bleeding episodes or complications. Will continue Eliquis 5 mg twice a day for 3 to 6 months.

## 2022-03-07 NOTE — Assessment & Plan Note (Signed)
Stable.  Diet and nutrition discussed. Continue rosuvastatin 40 mg daily.

## 2022-03-07 NOTE — Assessment & Plan Note (Signed)
Well-controlled hypertension. Continue valsartan 160 mg daily and carvedilol 12.5 mg twice a day Also continue amlodipine 5 mg daily BP Readings from Last 3 Encounters:  03/07/22 122/72  02/27/22 130/70  01/30/22 (!) 150/90  Dietary approaches to stop hypertension discussed

## 2022-03-07 NOTE — Progress Notes (Signed)
[name] 82 y.o.   Chief Complaint  Patient presents with   Follow-up    17mnh fu appt, no concerns     HISTORY OF PRESENT ILLNESS: This is a 82y.o. male here for 639-monthollow-up of chronic medical conditions. Other than generalized chronic weakness, no other complaint or medical concerns today. Since his last visit: [place] last [place] chest showed tiny subsegmental pulmonary emboli within right lower lobe, minimal thromboembolic burden.  Was started on Eliquis 5 mg twice a day and sent home.  Doing well.  No complications. Recent cardiologist office visit notes reviewed.  Assessment and plan as follows: ASSESSMENT:     1. Chronic diastolic heart failure (HC[facility]  2. Dyslipidemia   3. Coronary artery disease involving native coronary artery of native heart without angina pectoris   4. Other pulmonary embolism without acute cor pulmonale, unspecified chronicity ([facility]  5. Essential hypertension     PLAN:     In order of problems listed above:   Clinically stable.  Continue valsartan and carvedilol.  Not on diuretic therapy.  Appears euvolemic on exam. Treated with rosuvastatin.  Refill written today.  LDL cholesterol on last check is 51 mg/dL. Stable without symptoms of angina, treated with a beta-blocker and high intensity statin drug as well as aspirin 81 mg daily. Reviewed ER note and chest CT result.  He had tiny subsegmental pulmonary emboli.  I will refill his apixaban until he has primary care follow-up.  I would not think that he would require oral anticoagulation for greater than 6 months. On my recheck his blood pressure is 150/90 mmHg.  Increase amlodipine from 2.5 up to 5 mg daily.  Arrange APP follow-up in about 4 to 6 weeks.    HPI   Prior to Admission medications   Medication Sig Start Date End Date Taking? Authorizing Provider  acetaminophen (TYLENOL) 325 MG tablet Take 325 mg by mouth daily.   Yes [provider]   amLODipine (NORVASC) 5 MG tablet Take 1 tablet (5 mg total) by mouth daily. 01/30/22  Yes CoSherren MochaMD  apixaban (ELIQUIS) 5 MG TABS tablet Take 1 tablet (5 mg total) by mouth 2 (two) times daily. 01/30/22  Yes CoSherren MochaMD  carvedilol (COREG) 12.5 MG tablet Take 1 tablet (12.5 mg total) by mouth 2 (two) times daily. 03/07/21  Yes Zuriel Roskos, MiInes BloomerMD  pantoprazole (PROTONIX) 40 MG tablet Take 1 tablet (40 mg total) by mouth daily. 03/07/21  Yes Kashauna Celmer, MiInes BloomerMD  rosuvastatin (CRESTOR) 40 MG tablet Take 1 tablet (40 mg total) by mouth daily. 01/30/22  Yes CoSherren MochaMD  valsartan (DIOVAN) 160 MG tablet Take 1 tablet (160 mg total) by mouth daily. 07/20/21  Yes CoSherren MochaMD    Allergies  Allergen Reactions   Lipitor [Atorvastatin] Shortness Of Breath and Other (See Comments)    Sores non head   Morphine And Related Shortness Of Breath, Swelling and Rash    Throat swelling   Pork-Derived Products Swelling and Rash    Tongue swelling No pork products -     Patient Active Problem List   Diagnosis Date Noted   History of stroke 03/07/2021   Gait disturbance 09/20/2020   Chronic low back pain 08/24/2020   Angina of effort 08/08/2020   Type 2 diabetes mellitus with other specified complication, without long-term current use of insulin (HC[facility]04/01/2021   Aortic atherosclerosis (HCA[facility]04/01/2021   Chronic  diastolic heart failure ([place]) 02/14/2018   Non-ST elevation (NSTEMI) myocardial infarction [facility])    Essential hypertension 05/17/2016   Ischemic stroke ([place]) 05/02/2016   pandiverticulosis 04/22/2012   Internal hemorrhoids 04/19/2012   Dyslipidemia 06/14/2008   Hypertensive heart disease 06/14/2008   Coronary artery disease of native heart with stable angina pectoris, unspecified vessel or lesion type ([place]) 06/14/2008    Past Medical History:  Diagnosis Date   Blurred vision 05/17/2016   CAD (coronary artery disease), native coronary artery  06/14/2008   a. BMS to LCx & OM 2008 b. 03/2017 CABG x 4 (LIMA to LAD, SVG to diag 1, SVG to distal Circ, SVG to PDA)   Chest pain 04/16/2012   Diplopia    Echocardiogram    Echo 10/19: mod LVH, EF 55-60, no RWMA, Gr 1 DD, trivial AI, MAC, mod LAE, prob small post effusion   Essential hypertension 05/17/2016   Hyperlipidemia with target low density lipoprotein (LDL) cholesterol less than 70 mg/dL 06/14/2008   Qualifier: Diagnosis of  By: Mare Ferrari, RMA, [name]     Hypertension    Hypertensive heart disease 06/14/2008   Qualifier: Diagnosis of  By: Mare Ferrari, RMA, [name]     Hypertensive urgency, malignant 04/16/2012   Hypokalemia 04/14/2017   Internal hemorrhoids 04/19/2012   Ischemic stroke ([place]) 05/02/2016   Non-ST elevation (NSTEMI) myocardial infarction [facility])    pandiverticulosis 04/22/2012   12/17/2011. [place]. Juanita Craver MD. Colonoscopy. Moderate sized internal hemorrhoids and extensive pandiverticulosis. Repeat 5 years.     Pericardial effusion    Echo 11/19: mild focal basal septal hypertrophy, EF 55-60, Gr 2 DD, trivial AI, trivial TR, trivial PI, small to mod eff post to heart - no evidence of RV collapse.>> repeat limited echo in 03/2018 // Echo 03/2018: EF 55-60, mild LVH, +diastolic dysfunction, no RWMA, PASP 23, trivial pericardial effusion    Polycythemia vera(238.4) 04/17/2012   S/P CABG x 4 04/17/2017   Syncope and collapse 04/17/2012   Pt syncopized while sitting in bed giving history @ time of admission to hospital Tachycardic (appeared sinus) to 120s-130s and hypotensive to 50s/30s.  Unresponsive initially >> Spontaneously resolved after 2-3 minutes >> return to baseline ~30 minutes    Vertigo     Past Surgical History:  Procedure Laterality Date   CORONARY ARTERY BYPASS GRAFT N/A 04/17/2017   Procedure: CORONARY ARTERY BYPASS GRAFTING (CABG) x4 , using left internal mammary artery  to LAD and right leg greater saphenous vein harvested endoscopically  to PDA, Diagonal  I and Circumflex;  Surgeon: [name], MD;  Location: [facility];  Service: Open Heart Surgery;  Laterality: N/A;   CORONARY ARTERY BYPASS GRAFT  2020   CORONARY STENT PLACEMENT     LEFT HEART CATH AND CORONARY ANGIOGRAPHY N/A 04/16/2017   Procedure: LEFT HEART CATH AND CORONARY ANGIOGRAPHY;  Surgeon: Sherren Mocha, MD;  Location: [place] [facility];  Service: Cardiovascular;  Laterality: N/A;   TEE WITHOUT CARDIOVERSION N/A 04/17/2017   Procedure: TRANSESOPHAGEAL ECHOCARDIOGRAM (TEE);  Surgeon: [name], MD;  Location: [place];  Service: Open Heart Surgery;  Laterality: N/A;    Social History   Socioeconomic History   Marital status: Married    Spouse name: Not on file   Number of children: Not on file   Years of education: Not on file   Highest education level: Not on file  Occupational History   Not on file  Tobacco Use   Smoking status: Never  Smokeless tobacco: Never  Vaping Use   Vaping Use: Never used  Substance and Sexual Activity   Alcohol use: No    Alcohol/week: 0.0 standard drinks of alcohol   Drug use: No   Sexual activity: Not on file  Other Topics Concern   Not on file  Social History Narrative   Lives in [place] with Wife and 2 sons.  From [place].  To [place] ~2000.     Currently retired but worked in [place] Strain: [place]  (09/18/2021)   Overall Financial Resource Strain (CARDIA)    Difficulty of Paying Living Expenses: Not hard at all  Food Insecurity: No Food Insecurity (09/18/2021)   Hunger Vital Sign    Worried About Running Out of Food in the Last Year: Never true    [place] in the Last Year: Never true  Transportation Needs: No Transportation Needs (09/18/2021)   PRAPARE - Hydrologist (Medical): No    Lack of Transportation (Non-Medical): No  Physical Activity: Sufficiently Active (09/18/2021)   Exercise Vital Sign    Days of  Exercise per Week: 5 days    Minutes of Exercise per Session: 30 min  Stress: No Stress Concern Present (09/18/2021)   [place]    Feeling of Stress : Not at all  Social Connections: [place] (09/18/2021)   Social Connection and Isolation Panel [NHANES]    Frequency of Communication with Friends and Family: More than three times a week    Frequency of Social Gatherings with Friends and Family: Three times a week    Attends Religious Services: More than 4 times per year    Active Member of Clubs or Organizations: No    Attends Archivist Meetings: More than 4 times per year    Marital Status: Married  Human resources officer Violence: Not At Risk (09/18/2021)   Humiliation, Afraid, Rape, and Kick questionnaire    Fear of Current or Ex-Partner: No    Emotionally Abused: No    Physically Abused: No    Sexually Abused: No    Family History  Problem Relation Age of Onset   Heart disease Mother    Cancer Sister      Review of Systems  Constitutional: Negative.  Negative for chills and fever.  HENT: Negative.  Negative for congestion and sore throat.   Respiratory: Negative.  Negative for cough, hemoptysis and shortness of breath.   Cardiovascular: Negative.  Negative for chest pain and palpitations.  Gastrointestinal:  Negative for abdominal pain, diarrhea, nausea and vomiting.  Genitourinary: Negative.  Negative for dysuria and hematuria.  Skin: Negative.  Negative for rash.  Neurological:  Positive for weakness. Negative for dizziness and headaches.  All other systems reviewed and are negative.   Today's Vitals   03/07/22 1037  BP: 122/72  Pulse: 62  Temp: 97.8 F (36.6 C)  TempSrc: Oral  SpO2: 97%  Weight: 174 lb 6 oz (79.1 kg)  Height: _0  (1.676 m)   Body mass index is 28.14 kg/m. Wt Readings from Last 3 Encounters:  03/07/22 174 lb 6 oz (79.1 kg)  02/27/22 172 lb 12.8 oz (78.4 kg)   01/30/22 174 lb 3.2 oz (79 kg)    Physical Exam Vitals reviewed.  Constitutional:      Appearance: Normal appearance.  HENT:     Head: Normocephalic.  Eyes:     Extraocular Movements: Extraocular movements intact.     Conjunctiva/sclera: Conjunctivae normal.     Pupils: Pupils are equal, round, and reactive to light.  Cardiovascular:     Rate and Rhythm: Normal rate and regular rhythm.     Pulses: Normal pulses.     Heart sounds: Normal heart sounds.  Pulmonary:     Effort: Pulmonary effort is normal.     Breath sounds: Normal breath sounds.  Abdominal:     Palpations: Abdomen is soft.     Tenderness: There is no abdominal tenderness.  Musculoskeletal:     Cervical back: No tenderness.     Right lower leg: No edema.     Left lower leg: No edema.  Lymphadenopathy:     Cervical: No cervical adenopathy.  Skin:    General: Skin is warm and dry.     Capillary Refill: Capillary refill takes less than 2 seconds.  Neurological:     General: No focal deficit present.     Mental Status: He is alert and oriented to person, place, and time.  Psychiatric:        Mood and Affect: Mood normal.        Behavior: Behavior normal.      ASSESSMENT & PLAN: A total of 48 minutes was spent with the patient and counseling/coordination of care regarding preparing for this visit, review of most recent office visit notes, review of most recent cardiologist office visit notes, review of emergency department visit last November, review of multiple chronic medical conditions and their management, review of all medications, review of most recent blood work results, prognosis, documentation, education on nutrition, and need for follow-up  Problem List Items Addressed This Visit       Cardiovascular and Mediastinum   Coronary artery disease of native heart with stable angina pectoris, unspecified vessel or lesion type ([place])    Stable.  No recent anginal episodes. Continue beta-blocker with  carvedilol 12.5 mg twice a day      Essential hypertension - Primary    Well-controlled hypertension. Continue valsartan 160 mg daily and carvedilol 12.5 mg twice a day Also continue amlodipine 5 mg daily BP Readings from Last 3 Encounters:  03/07/22 122/72  02/27/22 130/70  01/30/22 (!) 150/90  Dietary approaches to stop hypertension discussed       Aortic atherosclerosis (HCC)    Stable.  Diet and nutrition discussed. Continue rosuvastatin 40 mg daily.      Single subsegmental pulmonary embolism without acute cor pulmonale (HCC)    Clinically stable.  No red flag signs or symptoms. Diagnosed last November 2023 Tolerating Eliquis well.  No clinical bleeding episodes or complications. Will continue Eliquis 5 mg twice a day for 3 to 6 months.        Other   Dyslipidemia    Stable.  Diet and nutrition discussed. Continue rosuvastatin 40 mg daily.      Other Visit Diagnoses     Need for vaccination       Relevant Orders   Flu Vaccine QUAD High Dose(Fluad) (Completed)      Patient Instructions  Mantenimiento de la salud despus de los 64 aos de edad Health Maintenance After Age 52 Despus de los 65 aos de edad, corre un riesgo mayor de Tourist information centre manager enfermedades e infecciones a Barrister's clerk, como tambin de sufrir lesiones por cadas. Las cadas son la causa principal de las fracturas de huesos y lesiones en la cabeza de Engineer, manufacturing  mayores de 55 aos de edad. Recibir cuidados preventivos de forma regular puede ayudarlo a mantenerse saludable y en buen Umbarger. Los cuidados preventivos incluyen realizarse anlisis de forma regular y Actor en el estilo de vida segn las recomendaciones del mdico. Converse con el mdico sobre lo siguiente: Las pruebas de deteccin y los anlisis que debe Dispensing optician. Una prueba de deteccin es un estudio que se para Hydrographic surveyor la presencia de una enfermedad cuando no tiene sntomas. Un plan de dieta y ejercicios adecuado para  usted. Qu debo saber sobre las pruebas de deteccin y los anlisis para prevenir cadas? Realizarse pruebas de deteccin y [employer] es la mejor manera de Hydrographic surveyor un problema de salud de forma temprana. El diagnstico y tratamiento tempranos le brindan la mejor oportunidad de Chief Technology Officer las afecciones mdicas que son comunes despus de los 51 aos de edad. Ciertas afecciones y elecciones de estilo de vida pueden hacer que sea ms propenso a sufrir Engineer, manufacturing. El mdico puede recomendarle lo siguiente: Controles regulares de la visin. Una visin deficiente y afecciones como las cataratas pueden hacer que sea ms propenso a sufrir Engineer, manufacturing. Si [place] lentes, asegrese de obtener una receta actualizada si su visin cambia. Revisin de medicamentos. Revise regularmente con el mdico todos los medicamentos que toma, incluidos los medicamentos de [place]. Consulte al [employer] efectos secundarios que pueden hacer que sea ms propenso a sufrir Engineer, manufacturing. Informe al mdico si alguno de los medicamentos que toma lo hace sentir mareado o somnoliento. Controles de fuerza y equilibrio. El mdico puede recomendar ciertos estudios para controlar su fuerza y equilibrio al estar de pie, al caminar o al cambiar de posicin. Examen de los pies. El dolor y Chiropractor en los pies, como tambin no utilizar el calzado [place], pueden hacer que sea ms propenso a sufrir Engineer, manufacturing. Pruebas de deteccin, que incluyen las siguientes: Pruebas de deteccin para la osteoporosis. La osteoporosis es una afeccin que hace que los huesos se tornen ms dbiles y se quiebren con ms facilidad. Pruebas de deteccin para la presin arterial. Los cambios en la presin arterial y los medicamentos para Chief Technology Officer la presin arterial pueden hacerlo sentir mareado. Prueba de deteccin de la depresin. Es ms probable que sufra una cada si tiene miedo a caerse, se siente deprimido o se siente incapaz de Engineer, technical sales. Prueba de deteccin de consumo de alcohol. Beber demasiado alcohol puede afectar su equilibrio y puede hacer que sea ms propenso a sufrir Engineer, manufacturing. Siga estas indicaciones en su casa: Estilo de vida No beba alcohol si: Su mdico le indica no hacerlo. Si bebe alcohol: Limite la cantidad que bebe a lo siguiente: De 0 a 1 medida por da para las mujeres. De 0 a 2 medidas por da para los hombres. Sepa cunta cantidad de alcohol hay en las bebidas que toma. En los [place], una medida equivale a una botella de cerveza de 12 oz (355 ml), un vaso de vino de 5 oz (148 ml) o un vaso de una bebida alcohlica de alta graduacin de 1 oz (44 ml). No consuma ningn producto que contenga nicotina o tabaco. Estos productos incluyen cigarrillos, tabaco para Higher education careers adviser y aparatos de vapeo, como los Psychologist, sport and exercise. Si necesita ayuda para dejar de consumir estos productos, consulte al [employer]. Actividad  Siga un programa de ejercicio regular para mantenerse en forma. Esto lo ayudar a Contractor equilibrio. Consulte al mdico qu tipos de ejercicios son adecuados para  usted. Si necesita un bastn o un andador, selo segn las recomendaciones del mdico. Utilice calzado con buen apoyo y suela antideslizante. Seguridad  Retire los [place] puedan causar tropiezos tales como alfombras, cables u obstculos. Instale equipos de seguridad, como barras para sostn en los baos y barandas de seguridad en las escaleras. [place] habitaciones y los pasillos bien iluminados. Indicaciones generales Hable con el mdico sobre sus riesgos de sufrir una cada. Infrmele a su mdico si: Se cae. Asegrese de informarle a su mdico acerca de todas las cadas, incluso aquellas que parecen ser [employer] Se siente mareado, cansado (tiene fatiga) o siente que pierde el equilibrio. Use los medicamentos de venta libre y los recetados solamente como se lo haya indicado el mdico. Estos incluyen  suplementos. Siga una dieta sana y [place] un peso saludable. Una dieta saludable incluye productos lcteos descremados, carnes bajas en contenido de grasa ([place]), fibra de granos enteros, frijoles y [place] frutas y verduras. [place]. Realcese los estudios de rutina de la salud, dentales y de Public librarian. Resumen Tener un estilo de vida saludable y recibir cuidados preventivos pueden ayudar a Theatre stage manager salud y el bienestar despus de los 74 aos de [place]. Realizarse pruebas de deteccin y [employer] es la mejor manera de Hydrographic surveyor un problema de salud de forma temprana y Lourena Simmonds a Product/process development scientist una cada. El diagnstico y tratamiento tempranos le brindan la mejor oportunidad de Chief Technology Officer las afecciones mdicas ms comunes en las personas mayores de 40 aos de edad. Las cadas son la causa principal de las fracturas de huesos y lesiones en la cabeza de personas mayores de 3 aos de edad. Tome precauciones para evitar una cada en su casa. Trabaje con el mdico para saber qu cambios que puede hacer para mejorar su salud y [place], y [place]. Esta informacin no tiene Marine scientist el consejo del mdico. Asegrese de hacerle al mdico cualquier pregunta que tenga. Document Revised: 07/13/2020 Document Reviewed: 07/13/2020 Elsevier Patient Education  [place], MD [facility]

## 2022-03-07 NOTE — Assessment & Plan Note (Signed)
Stable.  No recent anginal episodes. Continue beta-blocker with carvedilol 12.5 mg twice a day

## 2022-03-07 NOTE — Patient Instructions (Signed)
Mantenimiento de la salud despus de los 65 aos de edad Health Maintenance After Age 80 Despus de los 65 aos de edad, corre un riesgo mayor de padecer ciertas enfermedades e infecciones a largo plazo, como tambin de sufrir lesiones por cadas. Las cadas son la causa principal de las fracturas de huesos y lesiones en la cabeza de personas mayores de 65 aos de edad. Recibir cuidados preventivos de forma regular puede ayudarlo a mantenerse saludable y en buen estado. Los cuidados preventivos incluyen realizarse anlisis de forma regular y realizar cambios en el estilo de vida segn las recomendaciones del mdico. Converse con el mdico sobre lo siguiente: Las pruebas de deteccin y los anlisis que debe realizarse. Una prueba de deteccin es un estudio que se para detectar la presencia de una enfermedad cuando no tiene sntomas. Un plan de dieta y ejercicios adecuado para usted. Qu debo saber sobre las pruebas de deteccin y los anlisis para prevenir cadas? Realizarse pruebas de deteccin y anlisis es la mejor manera de detectar un problema de salud de forma temprana. El diagnstico y tratamiento tempranos le brindan la mejor oportunidad de controlar las afecciones mdicas que son comunes despus de los 65 aos de edad. Ciertas afecciones y elecciones de estilo de vida pueden hacer que sea ms propenso a sufrir una cada. El mdico puede recomendarle lo siguiente: Controles regulares de la visin. Una visin deficiente y afecciones como las cataratas pueden hacer que sea ms propenso a sufrir una cada. Si usa lentes, asegrese de obtener una receta actualizada si su visin cambia. Revisin de medicamentos. Revise regularmente con el mdico todos los medicamentos que toma, incluidos los medicamentos de venta libre. Consulte al mdico sobre los efectos secundarios que pueden hacer que sea ms propenso a sufrir una cada. Informe al mdico si alguno de los medicamentos que toma lo hace sentir mareado o  somnoliento. Controles de fuerza y equilibrio. El mdico puede recomendar ciertos estudios para controlar su fuerza y equilibrio al estar de pie, al caminar o al cambiar de posicin. Examen de los pies. El dolor y el adormecimiento en los pies, como tambin no utilizar el calzado adecuado, pueden hacer que sea ms propenso a sufrir una cada. Pruebas de deteccin, que incluyen las siguientes: Pruebas de deteccin para la osteoporosis. La osteoporosis es una afeccin que hace que los huesos se tornen ms dbiles y se quiebren con ms facilidad. Pruebas de deteccin para la presin arterial. Los cambios en la presin arterial y los medicamentos para controlar la presin arterial pueden hacerlo sentir mareado. Prueba de deteccin de la depresin. Es ms probable que sufra una cada si tiene miedo a caerse, se siente deprimido o se siente incapaz de realizar actividades que sola hacer. Prueba de deteccin de consumo de alcohol. Beber demasiado alcohol puede afectar su equilibrio y puede hacer que sea ms propenso a sufrir una cada. Siga estas indicaciones en su casa: Estilo de vida No beba alcohol si: Su mdico le indica no hacerlo. Si bebe alcohol: Limite la cantidad que bebe a lo siguiente: De 0 a 1 medida por da para las mujeres. De 0 a 2 medidas por da para los hombres. Sepa cunta cantidad de alcohol hay en las bebidas que toma. En los [place], una medida equivale a una botella de cerveza de 12 oz (355 ml), un vaso de vino de 5 oz (148 ml) o un vaso de una bebida alcohlica de alta graduacin de 1 oz (44 ml). No consuma ningn producto que   contenga nicotina o tabaco. Estos productos incluyen cigarrillos, tabaco para mascar y aparatos de vapeo, como los cigarrillos electrnicos. Si necesita ayuda para dejar de consumir estos productos, consulte al mdico. Actividad  Siga un programa de ejercicio regular para mantenerse en forma. Esto lo ayudar a mantener el equilibrio. Consulte al  mdico qu tipos de ejercicios son adecuados para usted. Si necesita un bastn o un andador, selo segn las recomendaciones del mdico. Utilice calzado con buen apoyo y suela antideslizante. Seguridad  Retire los objetos que puedan causar tropiezos tales como alfombras, cables u obstculos. Instale equipos de seguridad, como barras para sostn en los baos y barandas de seguridad en las escaleras. Mantenga las habitaciones y los pasillos bien iluminados. Indicaciones generales Hable con el mdico sobre sus riesgos de sufrir una cada. Infrmele a su mdico si: Se cae. Asegrese de informarle a su mdico acerca de todas las cadas, incluso aquellas que parecen ser menores. Se siente mareado, cansado (tiene fatiga) o siente que pierde el equilibrio. Use los medicamentos de venta libre y los recetados solamente como se lo haya indicado el mdico. Estos incluyen suplementos. Siga una dieta sana y mantenga un peso saludable. Una dieta saludable incluye productos lcteos descremados, carnes bajas en contenido de grasa (magras), fibra de granos enteros, frijoles y muchas frutas y verduras. Mantngase al da con las vacunas. Realcese los estudios de rutina de la salud, dentales y de la vista. Resumen Tener un estilo de vida saludable y recibir cuidados preventivos pueden ayudar a promover la salud y el bienestar despus de los 65 aos de edad. Realizarse pruebas de deteccin y anlisis es la mejor manera de detectar un problema de salud de forma temprana y ayudarlo a evitar una cada. El diagnstico y tratamiento tempranos le brindan la mejor oportunidad de controlar las afecciones mdicas ms comunes en las personas mayores de 65 aos de edad. Las cadas son la causa principal de las fracturas de huesos y lesiones en la cabeza de personas mayores de 65 aos de edad. Tome precauciones para evitar una cada en su casa. Trabaje con el mdico para saber qu cambios que puede hacer para mejorar su salud y  bienestar, y para prevenir las cadas. Esta informacin no tiene como fin reemplazar el consejo del mdico. Asegrese de hacerle al mdico cualquier pregunta que tenga. Document Revised: 07/13/2020 Document Reviewed: 07/13/2020 Elsevier Patient Education  2023 Elsevier Inc.  

## 2022-07-23 ENCOUNTER — Telehealth: Payer: Self-pay | Admitting: Cardiovascular Disease

## 2022-07-23 NOTE — Telephone Encounter (Signed)
New Message:    Patient called in and said his entire body was hurting. He denied chest pain and shortness and did not want to go the ER for any emergency. Attempted to ask questions, refused to answer any questions.Continue to say his entire body was hurting. He kept asking to speak to Dr Excell Seltzer nurse. He was told that she would call him after note was sent. Patient hung up after he was told this information.

## 2022-07-25 ENCOUNTER — Ambulatory Visit (HOSPITAL_COMMUNITY)
Admission: EM | Admit: 2022-07-25 | Discharge: 2022-07-25 | Disposition: A | Discharge status: Home / Self Care | Payer: Medicare HMO | Attending: Internal Medicine | Admitting: Internal Medicine

## 2022-07-25 ENCOUNTER — Ambulatory Visit (INDEPENDENT_AMBULATORY_CARE_PROVIDER_SITE_OTHER): Payer: Medicare HMO

## 2022-07-25 ENCOUNTER — Encounter (HOSPITAL_COMMUNITY): Payer: Self-pay | Admitting: Emergency Medicine

## 2022-07-25 DIAGNOSIS — M533 Sacrococcygeal disorders, not elsewhere classified: Secondary | ICD-10-CM | POA: Diagnosis not present

## 2022-07-25 DIAGNOSIS — S300XXA Contusion of lower back and pelvis, initial encounter: Secondary | ICD-10-CM

## 2022-07-25 MED ORDER — METHYLPREDNISOLONE SODIUM SUCC 125 MG IJ SOLR
80.0000 mg | Freq: Once | INTRAMUSCULAR | Status: AC
  Administered 2022-07-25: 80 mg via INTRAMUSCULAR

## 2022-07-25 MED ORDER — KETOROLAC TROMETHAMINE 30 MG/ML IJ SOLN
30.0000 mg | Freq: Once | INTRAMUSCULAR | Status: AC
  Administered 2022-07-25: 30 mg via INTRAMUSCULAR

## 2022-07-25 MED ORDER — METHYLPREDNISOLONE SODIUM SUCC 125 MG IJ SOLR
INTRAMUSCULAR | Status: AC
  Filled 2022-07-25: qty 2

## 2022-07-25 MED ORDER — KETOROLAC TROMETHAMINE 30 MG/ML IJ SOLN
INTRAMUSCULAR | Status: AC
  Filled 2022-07-25: qty 1

## 2022-07-25 MED ORDER — OXYCODONE-ACETAMINOPHEN 5-325 MG PO TABS: 1.0000 | ORAL_TABLET | Freq: Four times a day (QID) | ORAL | 0 refills | Status: AC | PRN

## 2022-07-25 NOTE — ED Triage Notes (Signed)
Pt reports falling when getting out of the shower and landing on his tailbone. Now has tailbone pain and difficulty ambulating. Also c/o head pain on the right side. Unsure if he hit his head.

## 2022-07-25 NOTE — ED Provider Notes (Signed)
[facility]   [id number] 07/25/22 Arrival Time: 8657  ASSESSMENT & PLAN:  1. Pelvic contusion, initial encounter    -X-rays negative for fracture.  This is a contusion.  He is really uncomfortable and in pain today.  Will treat him with IM injections of Toradol and methylprednisolone.  Also call in 3 days of Percocet for acute pain.  PDMP reviewed by me during this visit.  Encouraged to follow-up with his primary doctor, he already has an upcoming appointment on Monday.  All questions answered and agrees to plan.  Meds ordered this encounter  Medications   ketorolac (TORADOL) 30 MG/ML injection 30 mg   methylPREDNISolone sodium succinate (SOLU-MEDROL) 125 mg/2 mL injection 80 mg   oxyCODONE-acetaminophen (PERCOCET/ROXICET) 5-325 MG tablet    Sig: Take 1 tablet by mouth every 6 (six) hours as needed for up to 3 days for severe pain.    Dispense:  12 tablet    Refill:  0     Discharge Instructions      Take pain medicine only as needed Ice the area Take care to avoid falls  It was nice to meet you today!      Follow-up Information     Sagardia, Eilleen Kempf, MD.   Specialty: Internal Medicine Why: If symptoms worsen Contact information: [address] [place] [address] [phone]                  Reviewed expectations re: course of current medical issues. Questions answered. Outlined signs and symptoms indicating need for more acute intervention. Patient verbalized understanding. After Visit Summary given.   SUBJECTIVE: 82 year old male brought to clinic by his son to be evaluated for left hip pain.  The son says his father fell twice in the past week.  Most recently he fell on Sunday while in the shower.  He landed on the left side of his hip.  He reports significant constant aching pain over the left low back and top of the hip.  Is difficult to sit or weight-bear.  He has never had pain like this before.  The pain does not radiate.  He has  tried taking Tylenol but has not made it better.  No LMP for male patient. Past Surgical History:  Procedure Laterality Date   CORONARY ARTERY BYPASS GRAFT N/A 04/17/2017   Procedure: CORONARY ARTERY BYPASS GRAFTING (CABG) x4 , using left internal mammary artery  to LAD and right leg greater saphenous vein harvested endoscopically  to PDA, Diagonal I and Circumflex;  Surgeon: Delight Ovens, MD;  Location: [facility] OR;  Service: Open Heart Surgery;  Laterality: N/A;   CORONARY ARTERY BYPASS GRAFT  2020   CORONARY STENT PLACEMENT     LEFT HEART CATH AND CORONARY ANGIOGRAPHY N/A 04/16/2017   Procedure: LEFT HEART CATH AND CORONARY ANGIOGRAPHY;  Surgeon: Tonny Bollman, MD;  Location: [facility] INVASIVE CV LAB;  Service: Cardiovascular;  Laterality: N/A;   TEE WITHOUT CARDIOVERSION N/A 04/17/2017   Procedure: TRANSESOPHAGEAL ECHOCARDIOGRAM (TEE);  Surgeon: Delight Ovens, MD;  Location: [facility] OR;  Service: Open Heart Surgery;  Laterality: N/A;     OBJECTIVE:  Vitals:   07/25/22 1027  BP: 138/82  Pulse: 69  Resp: 18  Temp: 97.9 F (36.6 C)  TempSrc: Oral  SpO2: 98%     Physical Exam Vitals reviewed.  Constitutional:      General: He is in acute distress.     Appearance: Normal appearance.  HENT:     Head:  Normocephalic.  Eyes:     Extraocular Movements: Extraocular movements intact.  Cardiovascular:     Rate and Rhythm: Normal rate.  Pulmonary:     Effort: Pulmonary effort is normal.  Musculoskeletal:     Cervical back: Normal range of motion.     Comments: L Hip - no overlying skin changes.  No ecchymoses or erythema.  Tender to palpation at the ischial tuberosity.  Range of motion of the hip limited by pain.  Able to weight-bear but with some difficulty.  Antalgic gait.      Labs: Results for orders placed or performed in visit on 02/27/22  CBC  Result Value Ref Range   WBC 8.8 3.4 - 10.8 x10E3/uL   RBC 5.06 4.14 - 5.80 x10E6/uL   Hemoglobin 14.8 13.0 - 17.7 g/dL    Hematocrit 16.1 09.6 - 51.0 %   MCV 88 79 - 97 fL   MCH 29.2 26.6 - 33.0 pg   MCHC 33.1 31.5 - 35.7 g/dL   RDW 04.5 40.9 - 81.1 %   Platelets 244 150 - 450 x10E3/uL  Comprehensive metabolic panel  Result Value Ref Range   Glucose 105 (H) 70 - 99 mg/dL   BUN 15 8 - 27 mg/dL   Creatinine, Ser 9.14 0.76 - 1.27 mg/dL   eGFR 88 >78 GN/FAO/1.30   BUN/Creatinine Ratio 17 10 - 24   Sodium 143 134 - 144 mmol/L   Potassium 4.0 3.5 - 5.2 mmol/L   Chloride 105 96 - 106 mmol/L   CO2 24 20 - 29 mmol/L   Calcium 9.4 8.6 - 10.2 mg/dL   Total Protein 7.0 6.0 - 8.5 g/dL   Albumin 4.1 3.8 - 4.8 g/dL   Globulin, Total 2.9 1.5 - 4.5 g/dL   Albumin/Globulin Ratio 1.4 1.2 - 2.2   Bilirubin Total 0.6 0.0 - 1.2 mg/dL   Alkaline Phosphatase 99 44 - 121 IU/L   AST 13 0 - 40 IU/L   ALT 10 0 - 44 IU/L  TSH  Result Value Ref Range   TSH 2.110 0.450 - 4.500 uIU/mL   Labs Reviewed - No data to display  Imaging: DG Sacrum/Coccyx  Result Date: 07/25/2022 CLINICAL DATA:  Pain after fall while getting out of shower. Landed on tailbone. EXAM: SACRUM AND COCCYX - 2+ VIEW COMPARISON:  08/08/2020 FINDINGS: There is no evidence of fracture or other focal bone lesions. Degenerative disc disease noted within the lumbar spine at the L4-5 and L5-S1 level. Aortic atherosclerotic calcifications. IMPRESSION: 1. No acute findings. 2. Degenerative disc disease. Electronically Signed   By: Signa Kell M.D.   On: 07/25/2022 11:04     Allergies  Allergen Reactions   Lipitor [Atorvastatin] Shortness Of Breath and Other (See Comments)    Sores non head   Morphine And Codeine Shortness Of Breath, Swelling and Rash    Throat swelling   Pork-Derived Products Swelling and Rash    Tongue swelling No pork products -                                                Past Medical History:  Diagnosis Date   Blurred vision 05/17/2016   CAD (coronary artery disease), native coronary artery 06/14/2008   a. BMS to LCx & OM 2008 b.  03/2017 CABG x 4 (LIMA to LAD, SVG to diag 1,  SVG to distal Circ, SVG to PDA)   Chest pain 04/16/2012   Diplopia    Echocardiogram    Echo 10/19: mod LVH, EF 55-60, no RWMA, Gr 1 DD, trivial AI, MAC, mod LAE, prob small post effusion   Essential hypertension 05/17/2016   Hyperlipidemia with target low density lipoprotein (LDL) cholesterol less than 70 mg/dL 1/61/0960   Qualifier: Diagnosis of  By: Bascom Levels, RMA, [name]     Hypertension    Hypertensive heart disease 06/14/2008   Qualifier: Diagnosis of  By: Bascom Levels, RMA, [name]     Hypertensive urgency, malignant 04/16/2012   Hypokalemia 04/14/2017   Internal hemorrhoids 04/19/2012   Ischemic stroke (HCC) 05/02/2016   Non-ST elevation (NSTEMI) myocardial infarction [facility])    pandiverticulosis 04/22/2012   12/17/2011. [facility]. Charna Elizabeth MD. Colonoscopy. Moderate sized internal hemorrhoids and extensive pandiverticulosis. Repeat 5 years.     Pericardial effusion    Echo 11/19: mild focal basal septal hypertrophy, EF 55-60, Gr 2 DD, trivial AI, trivial TR, trivial PI, small to mod eff post to heart - no evidence of RV collapse.>> repeat limited echo in 03/2018 // Echo 03/2018: EF 55-60, mild LVH, +diastolic dysfunction, no RWMA, PASP 23, trivial pericardial effusion    Polycythemia vera(238.4) 04/17/2012   S/P CABG x 4 04/17/2017   Syncope and collapse 04/17/2012   Pt syncopized while sitting in bed giving history @ time of admission to hospital Tachycardic (appeared sinus) to 120s-130s and hypotensive to 50s/30s.  Unresponsive initially >> Spontaneously resolved after 2-3 minutes >> return to baseline ~30 minutes    Vertigo     Social History   Socioeconomic History   Marital status: Married    Spouse name: Not on file   Number of children: Not on file   Years of education: Not on file   Highest education level: Not on file  Occupational History   Not on file  Tobacco Use   Smoking status: Never   Smokeless tobacco: Never   Vaping Use   Vaping Use: Never used  Substance and Sexual Activity   Alcohol use: No    Alcohol/week: 0.0 standard drinks of alcohol   Drug use: No   Sexual activity: Not on file  Other Topics Concern   Not on file  Social History Narrative   Lives in [place] with Wife and 2 sons.  From [place].  To [place] ~2000.     Currently retired but worked in Theatre manager   Social Determinants of Corporate investment banker Strain: Low Risk  (09/18/2021)   Overall Financial Resource Strain (CARDIA)    Difficulty of Paying Living Expenses: Not hard at all  Food Insecurity: No Food Insecurity (09/18/2021)   Hunger Vital Sign    Worried About Running Out of Food in the Last Year: Never true    Ran Out of Food in the Last Year: Never true  Transportation Needs: No Transportation Needs (09/18/2021)   PRAPARE - Administrator, Civil Service (Medical): No    Lack of Transportation (Non-Medical): No  Physical Activity: Sufficiently Active (09/18/2021)   Exercise Vital Sign    Days of Exercise per Week: 5 days    Minutes of Exercise per Session: 30 min  Stress: No Stress Concern Present (09/18/2021)   [employer] of Occupational Health - Occupational Stress Questionnaire    Feeling of Stress : Not at all  Social Connections: Socially Integrated (09/18/2021)   Social Connection and Isolation Panel [NHANES]  Frequency of Communication with Friends and Family: More than three times a week    Frequency of Social Gatherings with Friends and Family: Three times a week    Attends Religious Services: More than 4 times per year    Active Member of Clubs or Organizations: No    Attends Banker Meetings: More than 4 times per year    Marital Status: Married  Catering manager Violence: Not At Risk (09/18/2021)   Humiliation, Afraid, Rape, and Kick questionnaire    Fear of Current or Ex-Partner: No    Emotionally Abused: No    Physically Abused: No    Sexually  Abused: No    Family History  Problem Relation Age of Onset   Heart disease Mother    Cancer Sister       Arvella Nigh, MD 07/25/22 1152

## 2022-07-25 NOTE — Discharge Instructions (Signed)
Take pain medicine only as needed Ice the area Take care to avoid falls  It was nice to meet you today!

## 2022-07-27 NOTE — Telephone Encounter (Signed)
Been unable to get ahold of patient or wife for approximately four days. Contacted daughter on [address] who lives in [place]. She is currently up here to help mother get to doctor appointments. States patients wife (her mother) has dementia, now early stage alzheimer's and is getting worse, which is a major stressor for her dad who thinks she is "just going to get better." Kandis Cocking states patient has begun falling "a lot" and estimates 6 falls in last month. Attests to 2 falls last week-one while in the yard outside, one in the shower. Thinks that they are mostly mechanical, but states he has started dragging his feet lately. Educated that we can rule out cardiac cause of fall, but that she should really speak with Dr Alvy Bimler. Denies any real  injuries with falls, but patient did go to Urgent Care after a fall last Sunday in the shower because he kept complaining of his back hurting. X-ray negative for injuries, given pain medication. Daughter requests current July follow-up appointment with Dr Excell Seltzer be moved up as the patient is adament that he feels Dr Excell Seltzer has the final answer for things. Appt moved to 08/08/22.

## 2022-07-30 ENCOUNTER — Encounter: Payer: Self-pay | Admitting: Emergency Medicine

## 2022-07-30 ENCOUNTER — Ambulatory Visit (INDEPENDENT_AMBULATORY_CARE_PROVIDER_SITE_OTHER): Payer: Medicare HMO | Admitting: Emergency Medicine

## 2022-07-30 ENCOUNTER — Ambulatory Visit (INDEPENDENT_AMBULATORY_CARE_PROVIDER_SITE_OTHER): Payer: Medicare HMO

## 2022-07-30 VITALS — BP 142/80 | HR 71 | Temp 97.8°F | Ht 66.0 in | Wt 173.2 lb

## 2022-07-30 DIAGNOSIS — S39012A Strain of muscle, fascia and tendon of lower back, initial encounter: Secondary | ICD-10-CM | POA: Insufficient documentation

## 2022-07-30 DIAGNOSIS — S7002XA Contusion of left hip, initial encounter: Secondary | ICD-10-CM | POA: Diagnosis not present

## 2022-07-30 DIAGNOSIS — M545 Low back pain, unspecified: Secondary | ICD-10-CM

## 2022-07-30 DIAGNOSIS — R102 Pelvic and perineal pain: Secondary | ICD-10-CM | POA: Diagnosis not present

## 2022-07-30 DIAGNOSIS — I700 Atherosclerosis of aorta: Secondary | ICD-10-CM | POA: Diagnosis not present

## 2022-07-30 DIAGNOSIS — I5032 Chronic diastolic (congestive) heart failure: Secondary | ICD-10-CM

## 2022-07-30 DIAGNOSIS — I25118 Atherosclerotic heart disease of native coronary artery with other forms of angina pectoris: Secondary | ICD-10-CM

## 2022-07-30 DIAGNOSIS — R2681 Unsteadiness on feet: Secondary | ICD-10-CM

## 2022-07-30 DIAGNOSIS — E785 Hyperlipidemia, unspecified: Secondary | ICD-10-CM | POA: Diagnosis not present

## 2022-07-30 DIAGNOSIS — Z9181 History of falling: Secondary | ICD-10-CM | POA: Insufficient documentation

## 2022-07-30 DIAGNOSIS — I110 Hypertensive heart disease with heart failure: Secondary | ICD-10-CM | POA: Diagnosis not present

## 2022-07-30 DIAGNOSIS — I10 Essential (primary) hypertension: Secondary | ICD-10-CM

## 2022-07-30 MED ORDER — KETOROLAC TROMETHAMINE 60 MG/2ML IM SOLN
60.0000 mg | Freq: Once | INTRAMUSCULAR | Status: AC
Start: 2022-07-30 — End: 2022-07-30
  Administered 2022-07-30: 60 mg via INTRAMUSCULAR

## 2022-07-30 MED ORDER — HYDROCODONE-ACETAMINOPHEN 5-325 MG PO TABS
1.0000 | ORAL_TABLET | Freq: Four times a day (QID) | ORAL | 0 refills | Status: DC | PRN
Start: 2022-07-30 — End: 2023-11-02

## 2022-07-30 MED ORDER — CYCLOBENZAPRINE HCL 10 MG PO TABS
10.0000 mg | ORAL_TABLET | Freq: Every day | ORAL | 0 refills | Status: AC
Start: 2022-07-30 — End: 2022-08-06

## 2022-07-30 NOTE — Assessment & Plan Note (Signed)
No fracture on x-rays done today Recommend to use walker instead of cane Pain management discussed.

## 2022-07-30 NOTE — Progress Notes (Signed)
[name] 82 y.o.   Chief Complaint  Patient presents with   Medical Management of Chronic Issues    f/u appt, patient fell twice week, went to ED on 6/5 had x ray, patient states he still having pain left side pain, pain meds not working     HISTORY OF PRESENT ILLNESS: This is a 82 y.o. male fell Sunday, 07/22/2022 while getting out of the shower.  Went to urgent care center on 07/25/2022 Negative x-rays of sacrum and coccyx.  Complaining of persistent pain to left buttock area since. Was given injections of Toradol and Solu-Medrol.  Sent home on Percocet.  Oxycodone not helping.  Still struggling with pain on ambulation Has fallen twice in the past couple weeks. No other injuries. Accompanied by wife and daughter today No other complaints or medical concerns today.  HPI   Prior to Admission medications   Medication Sig Start Date End Date Taking? Authorizing Provider  acetaminophen (TYLENOL) 325 MG tablet Take 325 mg by mouth daily.   Yes [provider]  amLODipine (NORVASC) 5 MG tablet Take 1 tablet (5 mg total) by mouth daily. 01/30/22  Yes Tonny Bollman, MD  apixaban (ELIQUIS) 5 MG TABS tablet Take 1 tablet (5 mg total) by mouth 2 (two) times daily. 01/30/22  Yes Tonny Bollman, MD  carvedilol (COREG) 12.5 MG tablet Take 1 tablet (12.5 mg total) by mouth 2 (two) times daily. 03/07/21  Yes Ayianna Darnold, Eilleen Kempf, MD  HYDROcodone-acetaminophen (NORCO) 5-325 MG tablet Take 1 tablet by mouth every 6 (six) hours as needed for moderate pain. 07/30/22  Yes Calena Salem, Eilleen Kempf, MD  pantoprazole (PROTONIX) 40 MG tablet Take 1 tablet (40 mg total) by mouth daily. 03/07/21  Yes Katieann Hungate, Eilleen Kempf, MD  rosuvastatin (CRESTOR) 40 MG tablet Take 1 tablet (40 mg total) by mouth daily. 01/30/22  Yes Tonny Bollman, MD  valsartan (DIOVAN) 160 MG tablet Take 1 tablet (160 mg total) by mouth daily. 07/20/21  Yes Tonny Bollman, MD    Allergies  Allergen Reactions   Lipitor  [Atorvastatin] Shortness Of Breath and Other (See Comments)    Sores non head   Morphine And Codeine Shortness Of Breath, Swelling and Rash    Throat swelling   Pork-Derived Products Swelling and Rash    Tongue swelling No pork products -     Patient Active Problem List   Diagnosis Date Noted   History of stroke 03/07/2021   Gait disturbance 09/20/2020   Chronic low back pain 08/24/2020   Type 2 diabetes mellitus with other specified complication, without long-term current use of insulin (HCC) 05/31/2020   Aortic atherosclerosis (HCC) 05/31/2020   Chronic diastolic heart failure (HCC) 02/14/2018   Essential hypertension 05/17/2016   pandiverticulosis 04/22/2012   Internal hemorrhoids 04/19/2012   Dyslipidemia 06/14/2008   Hypertensive heart disease 06/14/2008   Coronary artery disease of native heart with stable angina pectoris, unspecified vessel or lesion type (HCC) 06/14/2008    Past Medical History:  Diagnosis Date   Blurred vision 05/17/2016   CAD (coronary artery disease), native coronary artery 06/14/2008   a. BMS to LCx & OM 2008 b. 03/2017 CABG x 4 (LIMA to LAD, SVG to diag 1, SVG to distal Circ, SVG to PDA)   Chest pain 04/16/2012   Diplopia    Echocardiogram    Echo 10/19: mod LVH, EF 55-60, no RWMA, Gr 1 DD, trivial AI, MAC, mod LAE, prob small post effusion   Essential hypertension 05/17/2016  Hyperlipidemia with target low density lipoprotein (LDL) cholesterol less than 70 mg/dL 09/29/9145   Qualifier: Diagnosis of  By: Bascom Levels, RMA, [name]     Hypertension    Hypertensive heart disease 06/14/2008   Qualifier: Diagnosis of  By: Bascom Levels, RMA, [name]     Hypertensive urgency, malignant 04/16/2012   Hypokalemia 04/14/2017   Internal hemorrhoids 04/19/2012   Ischemic stroke (HCC) 05/02/2016   Non-ST elevation (NSTEMI) myocardial infarction [facility])    pandiverticulosis 04/22/2012   12/17/2011. [facility]. Charna Elizabeth MD. Colonoscopy. Moderate sized internal  hemorrhoids and extensive pandiverticulosis. Repeat 5 years.     Pericardial effusion    Echo 11/19: mild focal basal septal hypertrophy, EF 55-60, Gr 2 DD, trivial AI, trivial TR, trivial PI, small to mod eff post to heart - no evidence of RV collapse.>> repeat limited echo in 03/2018 // Echo 03/2018: EF 55-60, mild LVH, +diastolic dysfunction, no RWMA, PASP 23, trivial pericardial effusion    Polycythemia vera(238.4) 04/17/2012   S/P CABG x 4 04/17/2017   Syncope and collapse 04/17/2012   Pt syncopized while sitting in bed giving history @ time of admission to hospital Tachycardic (appeared sinus) to 120s-130s and hypotensive to 50s/30s.  Unresponsive initially >> Spontaneously resolved after 2-3 minutes >> return to baseline ~30 minutes    Vertigo     Past Surgical History:  Procedure Laterality Date   CORONARY ARTERY BYPASS GRAFT N/A 04/17/2017   Procedure: CORONARY ARTERY BYPASS GRAFTING (CABG) x4 , using left internal mammary artery  to LAD and right leg greater saphenous vein harvested endoscopically  to PDA, Diagonal I and Circumflex;  Surgeon: Delight Ovens, MD;  Location: [facility] OR;  Service: Open Heart Surgery;  Laterality: N/A;   CORONARY ARTERY BYPASS GRAFT  2020   CORONARY STENT PLACEMENT     LEFT HEART CATH AND CORONARY ANGIOGRAPHY N/A 04/16/2017   Procedure: LEFT HEART CATH AND CORONARY ANGIOGRAPHY;  Surgeon: Tonny Bollman, MD;  Location: [facility] INVASIVE CV LAB;  Service: Cardiovascular;  Laterality: N/A;   TEE WITHOUT CARDIOVERSION N/A 04/17/2017   Procedure: TRANSESOPHAGEAL ECHOCARDIOGRAM (TEE);  Surgeon: Delight Ovens, MD;  Location: [facility] OR;  Service: Open Heart Surgery;  Laterality: N/A;    Social History   Socioeconomic History   Marital status: Married    Spouse name: Not on file   Number of children: Not on file   Years of education: Not on file   Highest education level: Not on file  Occupational History   Not on file  Tobacco Use   Smoking status: Never    Smokeless tobacco: Never  Vaping Use   Vaping Use: Never used  Substance and Sexual Activity   Alcohol use: No    Alcohol/week: 0.0 standard drinks of alcohol   Drug use: No   Sexual activity: Not on file  Other Topics Concern   Not on file  Social History Narrative   Lives in [place] with Wife and 2 sons.  From [place].  To [place] ~2000.     Currently retired but worked in Theatre manager   Social Determinants of Corporate investment banker Strain: Low Risk  (09/18/2021)   Overall Financial Resource Strain (CARDIA)    Difficulty of Paying Living Expenses: Not hard at all  Food Insecurity: No Food Insecurity (09/18/2021)   Hunger Vital Sign    Worried About Running Out of Food in the Last Year: Never true    Ran Out of Food in the Last Year:  Never true  Transportation Needs: No Transportation Needs (09/18/2021)   PRAPARE - Administrator, Civil Service (Medical): No    Lack of Transportation (Non-Medical): No  Physical Activity: Sufficiently Active (09/18/2021)   Exercise Vital Sign    Days of Exercise per Week: 5 days    Minutes of Exercise per Session: 30 min  Stress: No Stress Concern Present (09/18/2021)   [employer] of Occupational Health - Occupational Stress Questionnaire    Feeling of Stress : Not at all  Social Connections: Socially Integrated (09/18/2021)   Social Connection and Isolation Panel [NHANES]    Frequency of Communication with Friends and Family: More than three times a week    Frequency of Social Gatherings with Friends and Family: Three times a week    Attends Religious Services: More than 4 times per year    Active Member of Clubs or Organizations: No    Attends Banker Meetings: More than 4 times per year    Marital Status: Married  Catering manager Violence: Not At Risk (09/18/2021)   Humiliation, Afraid, Rape, and Kick questionnaire    Fear of Current or Ex-Partner: No    Emotionally Abused: No    Physically  Abused: No    Sexually Abused: No    Family History  Problem Relation Age of Onset   Heart disease Mother    Cancer Sister      Review of Systems  Constitutional: Negative.  Negative for chills and fever.  HENT: Negative.  Negative for congestion and sore throat.   Respiratory: Negative.  Negative for cough and shortness of breath.   Cardiovascular: Negative.  Negative for chest pain and palpitations.  Gastrointestinal:  Negative for abdominal pain, diarrhea, nausea and vomiting.  Genitourinary: Negative.  Negative for dysuria and hematuria.  Musculoskeletal:  Positive for back pain.  Skin: Negative.  Negative for rash.  Neurological: Negative.  Negative for dizziness and headaches.  All other systems reviewed and are negative.   Vitals:   07/30/22 1353  BP: (!) 140/88  Pulse: 71  Temp: 97.8 F (36.6 C)  SpO2: 98%    Physical Exam Vitals reviewed.  Constitutional:      Appearance: Normal appearance.  HENT:     Head: Normocephalic.     Mouth/Throat:     Mouth: Mucous membranes are moist.     Pharynx: Oropharynx is clear.  Eyes:     Extraocular Movements: Extraocular movements intact.     Pupils: Pupils are equal, round, and reactive to light.  Cardiovascular:     Rate and Rhythm: Normal rate and regular rhythm.     Pulses: Normal pulses.     Heart sounds: Normal heart sounds.  Pulmonary:     Effort: Pulmonary effort is normal.     Breath sounds: Normal breath sounds.  Musculoskeletal:     Cervical back: No tenderness.     Lumbar back: Tenderness present. No bony tenderness. Decreased range of motion.     Comments: No hip tenderness. Positive tenderness to left buttock area and left lumbar spine No bony tenderness  Lymphadenopathy:     Cervical: No cervical adenopathy.  Skin:    General: Skin is warm and dry.     Capillary Refill: Capillary refill takes less than 2 seconds.  Neurological:     General: No focal deficit present.     Mental Status: He is  alert and oriented to person, place, and time.  Psychiatric:  Mood and Affect: Mood normal.        Behavior: Behavior normal.   DG Lumbar Spine 2-3 Views  Result Date: 07/30/2022 CLINICAL DATA:  Low back pain, recent fall. EXAM: LUMBAR SPINE - 2-3 VIEW COMPARISON:  Lumbar radiograph 10/28/2017 FINDINGS: Five non-rib-bearing lumbar vertebra. Straightening of normal lordosis, unchanged. No fracture compression deformity. Vertebral body heights are normal. There is mild diffuse disc space narrowing and spurring. Moderate L2-L3, L4-L5 and L5-S1 facet hypertrophy. No evidence of focal bone abnormality or bone destruction. The sacroiliac joints are congruent. Included sacrum is intact. IMPRESSION: 1. No acute fracture. 2. Mild multilevel degenerative disc disease. Moderate L4-L5 and L5-S1 facet hypertrophy. Electronically Signed   By: Narda Rutherford M.D.   On: 07/30/2022 15:07   DG HIP UNILAT W OR W/O PELVIS 2-3 VIEWS LEFT  Result Date: 07/30/2022 CLINICAL DATA:  Pelvic pain, recent trauma. Left hip and low back pain after fall. EXAM: DG HIP (WITH OR WITHOUT PELVIS) 2-3V LEFT COMPARISON:  None Available. FINDINGS: No acute or healing fracture of the pelvis or left hip. The femoral head is well seated in the acetabulum. There is mild acetabular spurring. Intact pubic rami. Pubic symphysis and sacroiliac joints are congruent. No erosion or focal bone abnormality. Scattered pelvic phleboliths. IMPRESSION: No fracture of the pelvis or left hip. Mild osteoarthritis. Electronically Signed   By: Narda Rutherford M.D.   On: 07/30/2022 15:04      ASSESSMENT & PLAN: A total of 48 minutes was spent with the patient and counseling/coordination of care regarding preparing for this visit, review of most recent office visit notes, review of most recent urgent care center notes, review of most recent x-rays including today's, review of multiple chronic medical conditions under management, review of all  medications, pain management, recommendation for physical therapy, fall precautions and necessary use of a walker, prognosis, documentation, and need for follow-up.  Problem List Items Addressed This Visit       Cardiovascular and Mediastinum   Hypertensive heart disease (Chronic)    BP Readings from Last 3 Encounters:  07/30/22 (!) 142/80  07/25/22 138/82  03/07/22 122/72  Elevated blood pressure today due to pain. Continue amlodipine 5 mg daily and carvedilol 12.5 mg twice a day and valsartan 160 mg daily No signs or symptoms of acute CHF.       Relevant Orders   AMB Referral to Chronic Care Management Services   Coronary artery disease of native heart with stable angina pectoris, unspecified vessel or lesion type (HCC)    Stable.  No recent anginal symptoms Continues Eliquis 5 mg twice a day and beta-blocker with carvedilol 12.5 mg twice a day      Relevant Medications   HYDROcodone-acetaminophen (NORCO) 5-325 MG tablet   cyclobenzaprine (FLEXERIL) 10 MG tablet   Other Relevant Orders   AMB Referral to Chronic Care Management Services   Essential hypertension   Relevant Orders   AMB Referral to Chronic Care Management Services   Chronic diastolic heart failure (HCC)   Aortic atherosclerosis (HCC)     Musculoskeletal and Integument   Strain of lumbar region    Secondary to recent fall Pain management discussed. Knows to avoid NSAIDs Oxycodone not working. Recommend Norco 5/325 as needed      Relevant Medications   HYDROcodone-acetaminophen (NORCO) 5-325 MG tablet   Other Relevant Orders   DG Lumbar Spine 2-3 Views (Completed)     Other   Dyslipidemia    Chronic stable condition. Continue rosuvastatin  40 mg daily      Lumbar pain    Secondary to recent fall Pain management discussed No fracture on x-rays done today      Relevant Medications   HYDROcodone-acetaminophen (NORCO) 5-325 MG tablet   cyclobenzaprine (FLEXERIL) 10 MG tablet   Gait instability     Leading to multiple falls Recommend physical therapy Recommend use of a walker      Relevant Orders   For home use only DME 4 wheeled rolling walker with seat ([id number])   Ambulatory referral to Physical Therapy   AMB Referral to Chronic Care Management Services   Contusion of left hip - Primary    No fracture on x-rays done today Recommend to use walker instead of cane Pain management discussed.      Relevant Medications   HYDROcodone-acetaminophen (NORCO) 5-325 MG tablet   cyclobenzaprine (FLEXERIL) 10 MG tablet   Other Relevant Orders   DG HIP UNILAT W OR W/O PELVIS 2-3 VIEWS LEFT (Completed)   History of recent fall   Relevant Orders   For home use only DME 4 wheeled rolling walker with seat ([id number])   Ambulatory referral to Physical Therapy   AMB Referral to Chronic Care Management Services   Patient Instructions  Contusin Contusion Una contusin es un hematoma profundo. Es el resultado de una lesin que causa sangrado debajo de la piel. Los sntomas de hematoma incluyen dolor, hinchazn y cambio de color en la piel. La piel puede ponerse azul, morada o [place]. Siga estas indicaciones en su casa: Control del dolor, la rigidez y la hinchazn Puede usar RHCE. Esto significa: Reposo. Hielo. Compresin, o ejercer presin sobre la zona lesionada. Elevar, o poner en alto la zona lesionada. Para seguir este mtodo, haga lo siguiente: Mantenga la zona de la lesin en reposo. Aplique hielo sobre la zona lesionada si se lo indican. Para hacer esto: Ponga el hielo en una bolsa plstica. Coloque una toalla entre la piel y Copy. Aplique el hielo durante 20 minutos, 2 o 3 veces por da. Si la piel se le pone de color rojo brillante, retire el hielo de inmediato para evitar daos en la piel. El riesgo de dao en la piel es mayor si no puede sentir dolor, calor o fro. Si se lo indican, ejerza compresin en la zona de la lesin con una venda elstica. Asegrese de que la  venda no est [place]. Si siente hormigueo o ha perdido la sensibilidad (adormecimiento) en la zona, qutesela y vuelva a colocarla como se lo haya indicado el mdico. Si es posible, cuando est sentado o acostado, eleve la zona lesionada por encima del nivel del corazn.  Indicaciones generales Use los medicamentos de venta libre y los recetados solamente como se lo haya indicado el mdico. Concurra a todas las visitas de seguimiento. Es posible que su mdico desee ver cmo est recuperndose la contusin con Scientist, research (medical). Comunquese con un mdico si: Los sntomas no mejoran despus de [address] de [place]. Sus sntomas empeoran. Tiene dificultad para mover la zona de la lesin. Solicite ayuda de inmediato si: Nurse, adult. Pierde la sensibilidad (adormecimiento) en una mano o un pie. La mano o el pie estn plidos o fros. Esta informacin no tiene Theme park manager el consejo del mdico. Asegrese de hacerle al mdico cualquier pregunta que tenga. Document Revised: 08/21/2021 Document Reviewed: 08/21/2021 Elsevier Patient Education  2024 Elsevier Inc.     Edwina Barth, MD [facility]

## 2022-07-30 NOTE — Assessment & Plan Note (Signed)
Secondary to recent fall Pain management discussed. Knows to avoid NSAIDs Oxycodone not working. Recommend Norco 5/325 as needed

## 2022-07-30 NOTE — Assessment & Plan Note (Addendum)
BP Readings from Last 3 Encounters:  07/30/22 (!) 142/80  07/25/22 138/82  03/07/22 122/72  Elevated blood pressure today due to pain. Continue amlodipine 5 mg daily and carvedilol 12.5 mg twice a day and valsartan 160 mg daily No signs or symptoms of acute CHF.

## 2022-07-30 NOTE — Assessment & Plan Note (Signed)
Chronic stable condition. Continue rosuvastatin 40 mg daily

## 2022-07-30 NOTE — Assessment & Plan Note (Signed)
Secondary to recent fall Pain management discussed No fracture on x-rays done today

## 2022-07-30 NOTE — Patient Instructions (Signed)
Contusin Contusion Una contusin es un hematoma profundo. Es el resultado de una lesin que causa sangrado debajo de la piel. Los sntomas de hematoma incluyen dolor, hinchazn y cambio de color en la piel. La piel puede ponerse azul, morada o [place]. Siga estas indicaciones en su casa: Control del dolor, la rigidez y la hinchazn Puede usar RHCE. Esto significa: Reposo. Hielo. Compresin, o ejercer presin sobre la zona lesionada. Elevar, o poner en alto la zona lesionada. Para seguir este mtodo, haga lo siguiente: Mantenga la zona de la lesin en reposo. Aplique hielo sobre la zona lesionada si se lo indican. Para hacer esto: Ponga el hielo en una bolsa plstica. Coloque una toalla entre la piel y Copy. Aplique el hielo durante 20 minutos, 2 o 3 veces por da. Si la piel se le pone de color rojo brillante, retire el hielo de inmediato para evitar daos en la piel. El riesgo de dao en la piel es mayor si no puede sentir dolor, calor o fro. Si se lo indican, ejerza compresin en la zona de la lesin con una venda elstica. Asegrese de que la venda no est [place]. Si siente hormigueo o ha perdido la sensibilidad (adormecimiento) en la zona, qutesela y vuelva a colocarla como se lo haya indicado el mdico. Si es posible, cuando est sentado o acostado, eleve la zona lesionada por encima del nivel del corazn.  Indicaciones generales Use los medicamentos de venta libre y los recetados solamente como se lo haya indicado el mdico. Concurra a todas las visitas de seguimiento. Es posible que su mdico desee ver cmo est recuperndose la contusin con Scientist, research (medical). Comunquese con un mdico si: Los sntomas no mejoran despus de [address] de [place]. Sus sntomas empeoran. Tiene dificultad para mover la zona de la lesin. Solicite ayuda de inmediato si: Nurse, adult. Pierde la sensibilidad (adormecimiento) en una mano o un pie. La mano o el pie estn plidos o  fros. Esta informacin no tiene Theme park manager el consejo del mdico. Asegrese de hacerle al mdico cualquier pregunta que tenga. Document Revised: 08/21/2021 Document Reviewed: 08/21/2021 Elsevier Patient Education  2024 [employer].

## 2022-07-30 NOTE — Assessment & Plan Note (Signed)
Stable.  No recent anginal symptoms Continues Eliquis 5 mg twice a day and beta-blocker with carvedilol 12.5 mg twice a day

## 2022-07-30 NOTE — Assessment & Plan Note (Signed)
Leading to multiple falls Recommend physical therapy Recommend use of a walker

## 2022-08-06 ENCOUNTER — Telehealth: Payer: Self-pay | Admitting: Emergency Medicine

## 2022-08-06 NOTE — Telephone Encounter (Signed)
A representative for [employer] called and said a rollator was ordered for the patient and the patient declined it. The patient said his wife has one and that it is too big. Best callback is [phone].

## 2022-08-07 NOTE — Telephone Encounter (Signed)
Called patient daughter and left message for her to call office back in reference to order for rollator walker

## 2022-08-08 ENCOUNTER — Other Ambulatory Visit: Payer: Self-pay | Admitting: *Deleted

## 2022-08-08 ENCOUNTER — Ambulatory Visit: Payer: Medicare HMO | Attending: Cardiovascular Disease | Admitting: Cardiovascular Disease

## 2022-08-08 ENCOUNTER — Encounter: Payer: Self-pay | Admitting: Cardiovascular Disease

## 2022-08-08 VITALS — BP 146/94 | HR 73 | Ht 66.0 in | Wt 175.6 lb

## 2022-08-08 DIAGNOSIS — E782 Mixed hyperlipidemia: Secondary | ICD-10-CM

## 2022-08-08 DIAGNOSIS — E785 Hyperlipidemia, unspecified: Secondary | ICD-10-CM | POA: Diagnosis not present

## 2022-08-08 DIAGNOSIS — R2681 Unsteadiness on feet: Secondary | ICD-10-CM

## 2022-08-08 DIAGNOSIS — I2510 Atherosclerotic heart disease of native coronary artery without angina pectoris: Secondary | ICD-10-CM

## 2022-08-08 DIAGNOSIS — I318 Other specified diseases of pericardium: Secondary | ICD-10-CM | POA: Diagnosis not present

## 2022-08-08 DIAGNOSIS — I5032 Chronic diastolic (congestive) heart failure: Secondary | ICD-10-CM | POA: Diagnosis not present

## 2022-08-08 DIAGNOSIS — I10 Essential (primary) hypertension: Secondary | ICD-10-CM | POA: Diagnosis not present

## 2022-08-08 DIAGNOSIS — Z9181 History of falling: Secondary | ICD-10-CM

## 2022-08-08 MED ORDER — ROSUVASTATIN CALCIUM 40 MG PO TABS
40.0000 mg | ORAL_TABLET | Freq: Every day | ORAL | 3 refills | Status: DC
Start: 2022-08-08 — End: 2023-11-04

## 2022-08-08 MED ORDER — AMLODIPINE BESYLATE 5 MG PO TABS: 5.0000 mg | ORAL_TABLET | Freq: Every day | ORAL | 3 refills | Status: DC

## 2022-08-08 MED ORDER — VALSARTAN 160 MG PO TABS: 160.0000 mg | ORAL_TABLET | Freq: Every day | ORAL | 3 refills | Status: DC

## 2022-08-08 MED ORDER — CARVEDILOL 12.5 MG PO TABS
12.5000 mg | ORAL_TABLET | Freq: Two times a day (BID) | ORAL | 3 refills | Status: DC
Start: 2022-08-08 — End: 2023-11-04

## 2022-08-08 NOTE — Patient Instructions (Addendum)
Medication Instructions:  Your physician recommends that you continue on your current medications as directed. Please refer to the Current Medication list given to you today.  *If you need a refill on your cardiac medications before your next appointment, please call your pharmacy*  Lab Work: If you have labs (blood work) drawn today and your tests are completely normal, you will receive your results only by: MyChart Message (if you have MyChart) OR A paper copy in the mail If you have any lab test that is abnormal or we need to change your treatment, we will call you to review the results.  Testing/Procedures: None ordered today.  Follow-Up: At [facility], you and your health needs are our priority.  As part of our continuing mission to provide you with exceptional heart care, we have created designated Provider Care Teams.  These Care Teams include your primary Cardiologist (physician) and Advanced Practice Providers (APPs -  Physician Assistants and Nurse Practitioners) who all work together to provide you with the care you need, when you need it.  We recommend signing up for the patient portal called "MyChart".  Sign up information is provided on this After Visit Summary.  MyChart is used to connect with patients for Virtual Visits (Telemedicine).  Patients are able to view lab/test results, encounter notes, upcoming appointments, etc.  Non-urgent messages can be sent to your provider as well.   To learn more about what you can do with MyChart, go to [email or web address].    Your next appointment:   6 months  Provider:   Tonny Bollman, MD

## 2022-08-08 NOTE — Progress Notes (Signed)
Cardiology Office Note:    Date:  08/08/2022   ID:  [name], DOB 1940/03/13, MRN [id number]  PCP:  Georgina Quint, MD   [facility] Providers Cardiologist:  Tonny Bollman, MD     Referring MD: Georgina Quint, *   Chief Complaint  Patient presents with   Coronary Artery Disease    History of Present Illness:    [name] is a 82 y.o. male with a hx of coronary artery disease status post CABG 2019, chronic diastolic heart failure, and HTN, presenting follow-up evaluation.  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  A PET scan was ultimately done which demonstrated a hypermetabolic soft tissue thickening along the lateral wall of the left ventricle within the pericardial space. We have continued with observation and he remains asymptomatic.   The patient is here with his daughter today. They report a challenging social situation as his wife has developed progressive dementia and they continue to live independently. He isn't in good enough health to care for her around the clock, but is doing his best. His grown children are all supportive, but nobody is there in the house with them all day. He denies CP, dyspnea, or edema. He has frequent dizziness. He's ambulating with a cane but probably needs a walker and his PCP has ordered one for him but the patient doesn't want it. His daughter who is present today states that they are inquiring about assisted living but early in the process.   Past Medical History:  Diagnosis Date   Blurred vision 05/17/2016   CAD (coronary artery disease), native coronary artery 06/14/2008   a. BMS to LCx & OM 2008 b. 03/2017 CABG x 4 (LIMA to LAD, SVG to diag 1, SVG to distal Circ, SVG to PDA)   Chest pain 04/16/2012   Diplopia    Echocardiogram    Echo 10/19: mod LVH, EF 55-60, no RWMA, Gr 1 DD, trivial AI,  MAC, mod LAE, prob small post effusion   Essential hypertension 05/17/2016   Hyperlipidemia with target low density lipoprotein (LDL) cholesterol less than 70 mg/dL 7/82/9562   Qualifier: Diagnosis of  By: Bascom Levels, RMA, [name]     Hypertension    Hypertensive heart disease 06/14/2008   Qualifier: Diagnosis of  By: Bascom Levels, RMA, [name]     Hypertensive urgency, malignant 04/16/2012   Hypokalemia 04/14/2017   Internal hemorrhoids 04/19/2012   Ischemic stroke (HCC) 05/02/2016   Non-ST elevation (NSTEMI) myocardial infarction [facility])    pandiverticulosis 04/22/2012   12/17/2011. [facility]. Charna Elizabeth MD. Colonoscopy. Moderate sized internal hemorrhoids and extensive pandiverticulosis. Repeat 5 years.     Pericardial effusion    Echo 11/19: mild focal basal septal hypertrophy, EF 55-60, Gr 2 DD, trivial AI, trivial TR, trivial PI, small to mod eff post to heart - no evidence of RV collapse.>> repeat limited echo in 03/2018 // Echo 03/2018: EF 55-60, mild LVH, +diastolic dysfunction, no RWMA, PASP 23, trivial pericardial effusion    Polycythemia vera(238.4) 04/17/2012   S/P CABG x 4 04/17/2017   Syncope and collapse 04/17/2012   Pt syncopized while sitting in bed giving history @ time of admission to hospital Tachycardic (appeared sinus) to 120s-130s and hypotensive to 50s/30s.  Unresponsive initially >> Spontaneously resolved after 2-3 minutes >> return to baseline ~30 minutes    Vertigo     Past Surgical History:  Procedure Laterality Date  CORONARY ARTERY BYPASS GRAFT N/A 04/17/2017   Procedure: CORONARY ARTERY BYPASS GRAFTING (CABG) x4 , using left internal mammary artery  to LAD and right leg greater saphenous vein harvested endoscopically  to PDA, Diagonal I and Circumflex;  Surgeon: Delight Ovens, MD;  Location: [facility] OR;  Service: Open Heart Surgery;  Laterality: N/A;   CORONARY ARTERY BYPASS GRAFT  2020   CORONARY STENT PLACEMENT     LEFT HEART CATH AND CORONARY ANGIOGRAPHY N/A  04/16/2017   Procedure: LEFT HEART CATH AND CORONARY ANGIOGRAPHY;  Surgeon: Tonny Bollman, MD;  Location: [facility] INVASIVE CV LAB;  Service: Cardiovascular;  Laterality: N/A;   TEE WITHOUT CARDIOVERSION N/A 04/17/2017   Procedure: TRANSESOPHAGEAL ECHOCARDIOGRAM (TEE);  Surgeon: Delight Ovens, MD;  Location: [facility] OR;  Service: Open Heart Surgery;  Laterality: N/A;    Current Medications: Current Meds  Medication Sig   acetaminophen (TYLENOL) 325 MG tablet Take 325 mg by mouth daily.   apixaban (ELIQUIS) 5 MG TABS tablet Take 1 tablet (5 mg total) by mouth 2 (two) times daily.   HYDROcodone-acetaminophen (NORCO) 5-325 MG tablet Take 1 tablet by mouth every 6 (six) hours as needed for moderate pain.   pantoprazole (PROTONIX) 40 MG tablet Take 1 tablet (40 mg total) by mouth daily.   [DISCONTINUED] amLODipine (NORVASC) 5 MG tablet Take 1 tablet (5 mg total) by mouth daily.   [DISCONTINUED] carvedilol (COREG) 12.5 MG tablet Take 1 tablet (12.5 mg total) by mouth 2 (two) times daily.   [DISCONTINUED] rosuvastatin (CRESTOR) 40 MG tablet Take 1 tablet (40 mg total) by mouth daily.   [DISCONTINUED] valsartan (DIOVAN) 160 MG tablet Take 1 tablet (160 mg total) by mouth daily.     Allergies:   Lipitor [atorvastatin], Morphine and codeine, and Pork-derived products   Social History   Socioeconomic History   Marital status: Married    Spouse name: Not on file   Number of children: Not on file   Years of education: Not on file   Highest education level: Not on file  Occupational History   Not on file  Tobacco Use   Smoking status: Never   Smokeless tobacco: Never  Vaping Use   Vaping Use: Never used  Substance and Sexual Activity   Alcohol use: No    Alcohol/week: 0.0 standard drinks of alcohol   Drug use: No   Sexual activity: Not on file  Other Topics Concern   Not on file  Social History Narrative   Lives in [place] with Wife and 2 sons.  From [place].  To [place] ~2000.     Currently  retired but worked in Theatre manager   Social Determinants of Corporate investment banker Strain: Low Risk  (09/18/2021)   Overall Financial Resource Strain (CARDIA)    Difficulty of Paying Living Expenses: Not hard at all  Food Insecurity: No Food Insecurity (09/18/2021)   Hunger Vital Sign    Worried About Running Out of Food in the Last Year: Never true    Ran Out of Food in the Last Year: Never true  Transportation Needs: No Transportation Needs (09/18/2021)   PRAPARE - Administrator, Civil Service (Medical): No    Lack of Transportation (Non-Medical): No  Physical Activity: Sufficiently Active (09/18/2021)   Exercise Vital Sign    Days of Exercise per Week: 5 days    Minutes of Exercise per Session: 30 min  Stress: No Stress Concern Present (09/18/2021)   [employer] of  Occupational Health - Occupational Stress Questionnaire    Feeling of Stress : Not at all  Social Connections: Socially Integrated (09/18/2021)   Social Connection and Isolation Panel [NHANES]    Frequency of Communication with Friends and Family: More than three times a week    Frequency of Social Gatherings with Friends and Family: Three times a week    Attends Religious Services: More than 4 times per year    Active Member of Clubs or Organizations: No    Attends Engineer, structural: More than 4 times per year    Marital Status: Married     Family History: The patient's family history includes Cancer in his sister; Heart disease in his mother.  ROS:   Please see the history of present illness.    All other systems reviewed and are negative.  EKGs/Labs/Other Studies Reviewed:    The following studies were reviewed today: Cardiac Studies & Procedures   CARDIAC CATHETERIZATION  CARDIAC CATHETERIZATION 04/16/2017  Narrative  The left ventricular systolic function is normal.  LV end diastolic pressure is mildly elevated.  The left ventricular ejection fraction is  55-65% by visual estimate.  Prox RCA lesion is 50% stenosed.  Mid RCA lesion is 95% stenosed.  Dist RCA lesion is 50% stenosed with 90% stenosed side branch in Ost RPDA.  Dist LM to Ost LAD lesion is 90% stenosed.  Ost 1st Diag lesion is 100% stenosed.  Prox LAD lesion is 85% stenosed.  Mid LAD lesion is 80% stenosed.  Ost 1st Mrg to 1st Mrg lesion is 80% stenosed.  Ost Cx lesion is 90% stenosed.  Prox Cx lesion is 50% stenosed.  1.  Critical left main and multivessel coronary artery disease with severe proximal LAD stenosis, severe ostial left circumflex stenosis and severe in-stent restenosis, and severe mid RCA stenosis 2.  Normal LV systolic function with mildly elevated LVEDP  Recommendations: The patient will be transferred to a cardiac stepdown bed.  IV heparin will be restarted and he will continue on IV nitroglycerin.  A cardiac surgical consultation will be placed for urgent coronary bypass surgery.  The patient is chest pain-free at the completion of the procedure but has critical multivessel disease.  Findings Coronary Findings Diagnostic  Dominance: Right  Left Main Dist LM to Ost LAD lesion is 90% stenosed. There is a critical lesion at the distal left main with 90% stenosis present.  Left Anterior Descending Prox LAD lesion is 85% stenosed. The proximal LAD has severe diffuse 80-85% stenosis extending to the first diagonal origin. Mid LAD lesion is 80% stenosed. There is a tight napkin ring stenosis in the distal LAD as well as diffuse disease throughout that region.  First Diagonal Branch Ost 1st Diag lesion is 100% stenosed. The first diagonal branch is totally occluded.  The vessel can be seen filling late from left to left collaterals.  Left Circumflex Ost Cx lesion is 90% stenosed. There is severe ostial circumflex stenosis, likely part of the distal left main complex plaque. Prox Cx lesion is 50% stenosed. The lesion was previously treated using a bare  metal stent over 2 years ago.  First Obtuse Marginal Branch Ost 1st Mrg to 1st Mrg lesion is 80% stenosed. The lesion was previously treated using a bare metal stent over 2 years ago. There is severe in-stent restenosis in the mid circumflex stent that extends into the first obtuse marginal branch.  Right Coronary Artery Prox RCA lesion is 50% stenosed. Mid RCA lesion is  95% stenosed. There is diffuse disease in the mid RCA extending into a critical hypodense 95% stenosis. Dist RCA lesion is 50% stenosed with 90% stenosed side branch in Ost RPDA.  Inferior Septal The PDA is diffusely diseased after a severe stenosis at its ostium  Intervention  No interventions have been documented.   STRESS TESTS  NM MYOCAR MULTI W/SPECT W 08/09/2020  Narrative CLINICAL DATA:  Chest pain. History of previous median sternotomy and CABG.  EXAM: MYOCARDIAL IMAGING WITH SPECT (REST AND PHARMACOLOGIC-STRESS)  GATED LEFT VENTRICULAR WALL MOTION STUDY  LEFT VENTRICULAR EJECTION FRACTION  TECHNIQUE: Standard myocardial SPECT imaging was performed after resting intravenous injection of 10 mCi Tc-1m tetrofosmin. Subsequently, intravenous infusion of Lexiscan was performed under the supervision of the Cardiology staff. At peak effect of the drug, 30 mCi Tc-21m tetrofosmin was injected intravenously and standard myocardial SPECT imaging was performed. Quantitative gated imaging was also performed to evaluate left ventricular wall motion, and estimate left ventricular ejection fraction.  COMPARISON:  Chest radiograph-08/08/2020; CT the chest, abdomen and pelvis-08/08/2020  FINDINGS: Raw images: Mild breast and chest wall attenuation is seen on both provided rest and stress images. Mild GI attenuation is seen, worse on the provided stress images. Mild patient motion artifact is seen, worse on the provided rest images.  Perfusion: No decreased activity in the left ventricle on stress imaging to  suggest reversible ischemia or infarction.  Wall Motion: Normal left ventricular wall motion. No left ventricular dilation.  Left Ventricular Ejection Fraction: 58 %  End diastolic volume 70 ml  End systolic volume 29 ml  IMPRESSION: 1. No scintigraphic evidence of prior infarction or pharmacologically induced ischemia.  2. Normal left ventricular wall motion.  3. Left ventricular ejection fraction 58%  4. Non invasive risk stratification*: Low  *2012 Appropriate Use Criteria for Coronary Revascularization Focused Update: J Am Coll Cardiol. 2012;59(9):857-881. http://content.dementiazones.com.aspx?articleid=1201161   Electronically Signed By: Simonne Come M.D. On: 08/09/2020 14:53   ECHOCARDIOGRAM  ECHOCARDIOGRAM COMPLETE 08/09/2021  Narrative ECHOCARDIOGRAM REPORT    Patient Name:   JR KOVACICH Date of Exam: 08/09/2021 Medical Rec #:  [id number]        Height:       66.0 in Accession #:    [id number]       Weight:       180.6 lb Date of Birth:  06-Nov-1940        BSA:          1.915 m Patient Age:    81 years         BP:           114/70 mmHg Patient Gender: M                HR:           60 bpm. Exam Location:  [address]  Procedure: 2D Echo, Cardiac Doppler and Color Doppler  Indications:    I31.8 Pericardial mass; I25.10 Coronary artery disease  History:        Patient has prior history of Echocardiogram examinations, most recent 08/09/2020. CHF, Previous Myocardial Infarction, Prior CABG, Stroke, Signs/Symptoms:Syncope; Risk Factors:Hypertension, Diabetes and Dyslipidemia. Hypertensive heart disease. Aortic atherosclerosis. Pericardial effusion.  Sonographer:    Cathie Beams RCS Referring Phys: [phone] Graylon Amory  IMPRESSIONS   1. Left ventricular ejection fraction, by estimation, is 55 to 60%. The left ventricle has normal function. The left ventricle has no regional wall motion abnormalities. There is mild concentric left ventricular  hypertrophy. Left  ventricular diastolic parameters were normal. 2. Right ventricular systolic function is normal. The right ventricular size is normal. 3. Left atrial size was moderately dilated. 4. Known pericardial mass not well visualized on current study. 5. The mitral valve is grossly normal. Trivial mitral valve regurgitation. No evidence of mitral stenosis. 6. The aortic valve is grossly normal. There is mild calcification of the aortic valve. Aortic valve regurgitation is trivial. Aortic valve sclerosis is present, with no evidence of aortic valve stenosis. 7. The inferior vena cava is normal in size with greater than 50% respiratory variability, suggesting right atrial pressure of 3 mmHg.  Comparison(s): No significant change from prior study.  Conclusion(s)/Recommendation(s): Otherwise normal echocardiogram, with minor abnormalities described in the report.  FINDINGS Left Ventricle: Left ventricular ejection fraction, by estimation, is 55 to 60%. The left ventricle has normal function. The left ventricle has no regional wall motion abnormalities. The left ventricular internal cavity size was normal in size. There is mild concentric left ventricular hypertrophy. Left ventricular diastolic parameters were normal.  Right Ventricle: The right ventricular size is normal. No increase in right ventricular wall thickness. Right ventricular systolic function is normal.  Left Atrium: Left atrial size was moderately dilated.  Right Atrium: Right atrial size was normal in size.  Pericardium: Known pericardial mass not well visualized on current study. There is no evidence of pericardial effusion.  Mitral Valve: The mitral valve is grossly normal. Mild mitral annular calcification. Trivial mitral valve regurgitation. No evidence of mitral valve stenosis.  Tricuspid Valve: The tricuspid valve is grossly normal. Tricuspid valve regurgitation is trivial. No evidence of tricuspid  stenosis.  Aortic Valve: The aortic valve is grossly normal. There is mild calcification of the aortic valve. Aortic valve regurgitation is trivial. Aortic valve sclerosis is present, with no evidence of aortic valve stenosis.  Pulmonic Valve: The pulmonic valve was grossly normal. Pulmonic valve regurgitation is trivial. No evidence of pulmonic stenosis.  Aorta: The aortic root, ascending aorta and aortic arch are all structurally normal, with no evidence of dilitation or obstruction.  Venous: The inferior vena cava is normal in size with greater than 50% respiratory variability, suggesting right atrial pressure of 3 mmHg.  IAS/Shunts: The atrial septum is grossly normal.   LEFT VENTRICLE PLAX 2D LVIDd:         3.30 cm   Diastology LVIDs:         2.30 cm   LV e' medial:    7.18 cm/s LV PW:         2.20 cm   LV E/e' medial:  12.7 LV IVS:        1.40 cm   LV e' lateral:   6.15 cm/s LVOT diam:     2.10 cm   LV E/e' lateral: 14.9 LV SV:         60 LV SV Index:   31 LVOT Area:     3.46 cm   RIGHT VENTRICLE RV Basal diam:  2.10 cm RV S prime:     12.00 cm/s TAPSE (M-mode): 1.6 cm  LEFT ATRIUM             Index        RIGHT ATRIUM           Index LA diam:        4.80 cm 2.51 cm/m   RA Area:     15.40 cm LA Vol (A2C):   63.6 ml 33.21 ml/m  RA Volume:   31.40  ml  16.40 ml/m LA Vol (A4C):   37.9 ml 19.79 ml/m LA Biplane Vol: 48.9 ml 25.54 ml/m AORTIC VALVE LVOT Vmax:   77.50 cm/s LVOT Vmean:  52.200 cm/s LVOT VTI:    0.172 m  AORTA Ao Root diam: 3.20 cm Ao Asc diam:  3.00 cm  MITRAL VALVE MV Area (PHT): 3.13 cm    SHUNTS MV Decel Time: 242 msec    Systemic VTI:  0.17 m MV E velocity: 91.50 cm/s  Systemic Diam: 2.10 cm MV A velocity: 90.70 cm/s MV E/A ratio:  1.01  Jodelle Red MD Electronically signed by Jodelle Red MD Signature Date/Time: 08/09/2021/4:39:52 PM    Final   TEE  ECHO TEE 04/18/2017  Interpretation Summary  Septum: No  Patent Foramen Ovale present.  Left atrium: Patent foramen ovale not present.  Aortic valve: The valve is trileaflet. No stenosis. Mild regurgitation.  Mitral valve: Trace regurgitation.  Right ventricle: Normal cavity size and ejection fraction.  Tricuspid valve: Trace regurgitation.     CARDIAC MRI  MR CARDIAC MORPHOLOGY W WO CONTRAST 08/31/2020  Narrative CLINICAL DATA:  Clinical question of pericardial mass 82 year old male  EXAM: CARDIAC MRI  TECHNIQUE: The patient was scanned on a 1.5 Tesla GE magnet. A dedicated cardiac coil was used. Functional imaging was done using Fiesta sequences. 2,3, and 4 chamber views were done to assess for RWMA's. Modified Simpson's rule using a short axis stack was used to calculate an ejection fraction on a dedicated work Research officer, trade union. The patient received 10 cc of Gadavist. After 10 minutes inversion recovery sequences were used to assess for infiltration and scar tissue.  CONTRAST:  10 cc  of Gadavist  FINDINGS: 1. Normal left ventricular size, with LVEDD 41 mm, and LVEDVi 61 mL/m2.  Normal left ventricular thickness, with intraventricular septal thickness of 9 mm, posterior wall thickness of 10 mm.  Mild left ventricular systolic dysfunction (LVEF =49%). There are no regional wall motion abnormalities; mild global hypokinesis.  2. Normal right ventricular size with RVEDVI 46 mL/m2.  Normal right ventricular thickness.  Normal right ventricular systolic function (RVEF =56%). There are no regional wall motion abnormalities or aneurysms.  3.  Normal right atrial size and moderate left atrial dilation.  4. Normal size of the aortic root, ascending aorta and pulmonary artery.  5.  No significant valvular abnormalities.  6. There is a moderate (max dimension 17 mm) heterogeneous effusion overlying the lateral wall of the left ventricle (most prominent over the anterolateral base. There is intermediate but  largely iso-intense native T1 signal intensity (1159 ms max average 1070 ms) with an intermediate to iso-intense T2 signal (65 ms at max, average 50 ms). There is evidence of contrast enhancement and pericardial thickening. No evidence of fat deposition based on dark blood assessment. These is an area of lack of enhancement best seen in the PSIR view.  7. There is evidence of sternotomy wires and both breath hold and wrap around artifact. Grossly, no other extracardiac findings. Recommended dedicated study if concerned for non-cardiac pathology.  IMPRESSION: Complex pericardial confluence as described above. Reaching out to primary MD.  Riley Lam MD   Electronically Signed By: Riley Lam MD On: 08/31/2020 08:19          EKG:  EKG is not ordered today.  The ekg ordered today demonstrates:  EKG Interpretation  Date/Time:    Ventricular Rate:    PR Interval:    QRS Duration:  QT Interval:    QTC Calculation:   R Axis:     Text Interpretation:           Recent Labs: 02/27/2022: ALT 10; BUN 15; Creatinine, Ser 0.87; Hemoglobin 14.8; Platelets 244; Potassium 4.0; Sodium 143; TSH 2.110  Recent Lipid Panel    Component Value Date/Time   CHOL 111 03/07/2021 1145   CHOL 105 08/14/2019 0956   TRIG 102.0 03/07/2021 1145   HDL 39.10 03/07/2021 1145   HDL 37 (L) 08/14/2019 0956   CHOLHDL 3 03/07/2021 1145   VLDL 20.4 03/07/2021 1145   LDLCALC 51 03/07/2021 1145   LDLCALC 50 08/14/2019 0956   LDLDIRECT 150.8 06/30/2008 1007     Risk Assessment/Calculations:           Physical Exam:    VS:  BP (!) 146/94   Pulse 73   Ht 5\' 6"  (1.676 m)   Wt 175 lb 9.6 oz (79.7 kg)   SpO2 98%   BMI 28.34 kg/m     Wt Readings from Last 3 Encounters:  08/08/22 175 lb 9.6 oz (79.7 kg)  07/30/22 173 lb 4 oz (78.6 kg)  03/07/22 174 lb 6 oz (79.1 kg)     GEN: elderly male,  Well nourished, well developed in no acute distress HEENT: Normal NECK: No JVD;  No carotid bruits LYMPHATICS: No lymphadenopathy CARDIAC: RRR, no murmurs, rubs, gallops RESPIRATORY:  Clear to auscultation without rales, wheezing or rhonchi  ABDOMEN: Soft, non-tender, non-distended MUSCULOSKELETAL:  No edema; No deformity  SKIN: Warm and dry NEUROLOGIC:  Alert and oriented x 3 PSYCHIATRIC:  Normal affect   ASSESSMENT:    1. Coronary artery disease involving native coronary artery of native heart without angina pectoris   2. Chronic diastolic heart failure (HCC)   3. Mixed hyperlipidemia   4. Essential hypertension   5. Pericardial mass   6. Dyslipidemia    PLAN:    In order of problems listed above:  Stable on current med Rx. No angina Euvolemic on exam and no HF symptoms at present. He's more limited by weakness and instability than anything else. Continue current Rx - control BP. Treated with rosuvastatin. Goal LDL < 70.  BP controlled based on home readings. He didn't take meds today because his wife was 'fighting with him' when they left.  Has been stable on serial imaging studies.  LDL at goal (51 mg/dL) on last check. Continue same Rx.      I'll plan to see him back in 6 months. Difficult situation at home as outlined above. Tried to offer support.    Medication Adjustments/Labs and Tests Ordered: Current medicines are reviewed at length with the patient today.  Concerns regarding medicines are outlined above.  No orders of the defined types were placed in this encounter.  Meds ordered this encounter  Medications   amLODipine (NORVASC) 5 MG tablet    Sig: Take 1 tablet (5 mg total) by mouth daily.    Dispense:  90 tablet    Refill:  3   carvedilol (COREG) 12.5 MG tablet    Sig: Take 1 tablet (12.5 mg total) by mouth 2 (two) times daily.    Dispense:  180 tablet    Refill:  3   rosuvastatin (CRESTOR) 40 MG tablet    Sig: Take 1 tablet (40 mg total) by mouth daily.    Dispense:  90 tablet    Refill:  3   valsartan (DIOVAN) 160 MG tablet  Sig: Take 1 tablet (160 mg total) by mouth daily.    Dispense:  90 tablet    Refill:  3    Patient Instructions  Medication Instructions:  Your physician recommends that you continue on your current medications as directed. Please refer to the Current Medication list given to you today.  *If you need a refill on your cardiac medications before your next appointment, please call your pharmacy*  Lab Work: If you have labs (blood work) drawn today and your tests are completely normal, you will receive your results only by: MyChart Message (if you have MyChart) OR A paper copy in the mail If you have any lab test that is abnormal or we need to change your treatment, we will call you to review the results.  Testing/Procedures: None ordered today.  Follow-Up: At [facility], you and your health needs are our priority.  As part of our continuing mission to provide you with exceptional heart care, we have created designated Provider Care Teams.  These Care Teams include your primary Cardiologist (physician) and Advanced Practice Providers (APPs -  Physician Assistants and Nurse Practitioners) who all work together to provide you with the care you need, when you need it.  We recommend signing up for the patient portal called "MyChart".  Sign up information is provided on this After Visit Summary.  MyChart is used to connect with patients for Virtual Visits (Telemedicine).  Patients are able to view lab/test results, encounter notes, upcoming appointments, etc.  Non-urgent messages can be sent to your provider as well.   To learn more about what you can do with MyChart, go to [email or web address].    Your next appointment:   6 months  Provider:   Tonny Bollman, MD        Signed, Tonny Bollman, MD  08/08/2022 1:43 PM    [facility]

## 2022-08-15 ENCOUNTER — Telehealth: Payer: Self-pay

## 2022-08-15 NOTE — Progress Notes (Signed)
  Chronic Care Management   Note  08/15/2022 Name: [name] MRN: [id number] DOB: [birth year]  [name] is a 82 y.o. year old male who is a primary care patient of Alvy Bimler, Eilleen Kempf, MD. I reached out to Elvera Maria by phone today in response to a referral sent by [name] PCP.  [name] was given information about Chronic Care Management services today including:  CCM service includes personalized support from designated clinical staff supervised by the physician, including individualized plan of care and coordination with other care providers 24/7 contact phone numbers for assistance for urgent and routine care needs. Service will only be billed when office clinical staff spend 20 minutes or more in a month to coordinate care. Only one practitioner may furnish and bill the service in a calendar month. The patient may stop CCM services at amy time (effective at the end of the month) by phone call to the office staff. The patient will be responsible for cost sharing (co-pay) or up to 20% of the service fee (after annual deductible is met)  [name]  agreedto scheduling an appointment with the CCM RN Case Manager   Follow up plan: Patient agreed to scheduled appointment with RN Case Manager on 08/17/2022(date/time).   Penne Lash, RMA Care Guide [facility]  [place], [place] [address] Direct Dial: [phone] [email or web address] [email or web address]

## 2022-08-16 NOTE — Therapy (Unsigned)
OUTPATIENT PHYSICAL THERAPY THORACOLUMBAR EVALUATION   Patient Name: [name] MRN: [id number] DOB:1940/12/13, 82 y.o., male Today's Date: 08/21/2022  END OF SESSION:  PT End of Session - 08/21/22 1030     Visit Number 1    Number of Visits 1    Date for PT Re-Evaluation 10/16/22    Authorization Type [employer] MCR    PT Start Time 1445    PT Stop Time 1530    PT Time Calculation (min) 45 min    Activity Tolerance Treatment limited secondary to agitation;Other (comment)    Behavior During Therapy Restless;Impulsive             Past Medical History:  Diagnosis Date   Blurred vision 05/17/2016   CAD (coronary artery disease), native coronary artery 06/14/2008   a. BMS to LCx & OM 2008 b. 03/2017 CABG x 4 (LIMA to LAD, SVG to diag 1, SVG to distal Circ, SVG to PDA)   Chest pain 04/16/2012   Diplopia    Echocardiogram    Echo 10/19: mod LVH, EF 55-60, no RWMA, Gr 1 DD, trivial AI, MAC, mod LAE, prob small post effusion   Essential hypertension 05/17/2016   Hyperlipidemia with target low density lipoprotein (LDL) cholesterol less than 70 mg/dL 05/28/8117   Qualifier: Diagnosis of  By: Bascom Levels, RMA, [name]     Hypertension    Hypertensive heart disease 06/14/2008   Qualifier: Diagnosis of  By: Bascom Levels, RMA, [name]     Hypertensive urgency, malignant 04/16/2012   Hypokalemia 04/14/2017   Internal hemorrhoids 04/19/2012   Ischemic stroke (HCC) 05/02/2016   Non-ST elevation (NSTEMI) myocardial infarction [facility])    pandiverticulosis 04/22/2012   12/17/2011. [facility]. Charna Elizabeth MD. Colonoscopy. Moderate sized internal hemorrhoids and extensive pandiverticulosis. Repeat 5 years.     Pericardial effusion    Echo 11/19: mild focal basal septal hypertrophy, EF 55-60, Gr 2 DD, trivial AI, trivial TR, trivial PI, small to mod eff post to heart - no evidence of RV collapse.>> repeat limited echo in 03/2018 // Echo 03/2018: EF 55-60, mild LVH, +diastolic dysfunction, no RWMA, PASP  23, trivial pericardial effusion    Polycythemia vera(238.4) 04/17/2012   S/P CABG x 4 04/17/2017   Syncope and collapse 04/17/2012   Pt syncopized while sitting in bed giving history @ time of admission to hospital Tachycardic (appeared sinus) to 120s-130s and hypotensive to 50s/30s.  Unresponsive initially >> Spontaneously resolved after 2-3 minutes >> return to baseline ~30 minutes    Vertigo    Past Surgical History:  Procedure Laterality Date   CORONARY ARTERY BYPASS GRAFT N/A 04/17/2017   Procedure: CORONARY ARTERY BYPASS GRAFTING (CABG) x4 , using left internal mammary artery  to LAD and right leg greater saphenous vein harvested endoscopically  to PDA, Diagonal I and Circumflex;  Surgeon: Delight Ovens, MD;  Location: [facility] OR;  Service: Open Heart Surgery;  Laterality: N/A;   CORONARY ARTERY BYPASS GRAFT  2020   CORONARY STENT PLACEMENT     LEFT HEART CATH AND CORONARY ANGIOGRAPHY N/A 04/16/2017   Procedure: LEFT HEART CATH AND CORONARY ANGIOGRAPHY;  Surgeon: Tonny Bollman, MD;  Location: [facility] INVASIVE CV LAB;  Service: Cardiovascular;  Laterality: N/A;   TEE WITHOUT CARDIOVERSION N/A 04/17/2017   Procedure: TRANSESOPHAGEAL ECHOCARDIOGRAM (TEE);  Surgeon: Delight Ovens, MD;  Location: [facility] OR;  Service: Open Heart Surgery;  Laterality: N/A;   Patient Active Problem List   Diagnosis Date Noted   Contusion of left hip 07/30/2022  Strain of lumbar region 07/30/2022   History of recent fall 07/30/2022   History of stroke 03/07/2021   Gait instability 09/20/2020   Lumbar pain 08/24/2020   Aortic atherosclerosis (HCC) 05/31/2020   Chronic diastolic heart failure (HCC) 02/14/2018   Essential hypertension 05/17/2016   pandiverticulosis 04/22/2012   Internal hemorrhoids 04/19/2012   Dyslipidemia 06/14/2008   Hypertensive heart disease 06/14/2008   Coronary artery disease of native heart with stable angina pectoris, unspecified vessel or lesion type (HCC) 06/14/2008    PCP:  Georgina Quint, MD   REFERRING PROVIDER: Georgina Quint, MD  REFERRING DIAG: [phone] (ICD-10-CM) - History of recent fall R26.81 (ICD-10-CM) - Gait instability  Rationale for Evaluation and Treatment: Rehabilitation  THERAPY DIAG:  Other abnormalities of gait and mobility  Muscle weakness (generalized)  Repeated falls  ONSET DATE: 07/22/22  SUBJECTIVE:                                                                                                                                                                                           SUBJECTIVE STATEMENT: Patient referred to PT due to frequent falls   PERTINENT HISTORY:  This is a 82 y.o. male fell Sunday, 07/22/2022 while getting out of the shower.  Went to urgent care center on 07/25/2022 Negative x-rays of sacrum and coccyx.  Complaining of persistent pain to left buttock area since. Was given injections of Toradol and Solu-Medrol.  Sent home on Percocet.  Oxycodone not helping.  Still struggling with pain on ambulation Has fallen twice in the past couple weeks.  PAIN:  Are you having pain? Yes: NPRS scale: 2/10 Pain location: L hip Pain description: ache/sore Aggravating factors: activity Relieving factors: rest  PRECAUTIONS: Fall  WEIGHT BEARING RESTRICTIONS: No  FALLS:  Has patient fallen in last 6 months? Yes. Number of falls 3  LIVING ENVIRONMENT: Lives with: lives with their family Lives in: House/apartment Stairs:  yes Has following equipment at home: Single point cane and Environmental consultant - 4 wheeled  OCCUPATION: retired  PLOF: Independent  PATIENT GOALS: Prevent falls  NEXT MD VISIT: PRN  OBJECTIVE:   DIAGNOSTIC FINDINGS:  DG Lumbar Spine 2-3 Views   Result Date: 07/30/2022 CLINICAL DATA:  Low back pain, recent fall. EXAM: LUMBAR SPINE - 2-3 VIEW COMPARISON:  Lumbar radiograph 10/28/2017 FINDINGS: Five non-rib-bearing lumbar vertebra. Straightening of normal lordosis, unchanged. No fracture  compression deformity. Vertebral body heights are normal. There is mild diffuse disc space narrowing and spurring. Moderate L2-L3, L4-L5 and L5-S1 facet hypertrophy. No evidence of focal bone abnormality or bone destruction. The sacroiliac joints are congruent. Included sacrum is intact. IMPRESSION:  1. No acute fracture. 2. Mild multilevel degenerative disc disease. Moderate L4-L5 and L5-S1 facet hypertrophy. Electronically Signed   By: Narda Rutherford M.D.   On: 07/30/2022 15:07    DG HIP UNILAT W OR W/O PELVIS 2-3 VIEWS LEFT   Result Date: 07/30/2022 CLINICAL DATA:  Pelvic pain, recent trauma. Left hip and low back pain after fall. EXAM: DG HIP (WITH OR WITHOUT PELVIS) 2-3V LEFT COMPARISON:  None Available. FINDINGS: No acute or healing fracture of the pelvis or left hip. The femoral head is well seated in the acetabulum. There is mild acetabular spurring. Intact pubic rami. Pubic symphysis and sacroiliac joints are congruent. No erosion or focal bone abnormality. Scattered pelvic phleboliths. IMPRESSION: No fracture of the pelvis or left hip. Mild osteoarthritis. Electronically Signed   By: Narda Rutherford M.D.   On: 07/30/2022 15:04    PATIENT SURVEYS:  FOTO UTA  SCREENING FOR RED FLAGS: negative   MUSCLE LENGTH: Deferred  POSTURE: rounded shoulders, decreased lumbar lordosis, and varus  PALPATION: deferred  LUMBAR ROM: deferred  AROM eval  Flexion   Extension   Right lateral flexion   Left lateral flexion   Right rotation   Left rotation    (Blank rows = not tested)  LOWER EXTREMITY ROM:   Limited throughout BLEs  Active  Right eval Left eval  Hip flexion    Hip extension    Hip abduction    Hip adduction    Hip internal rotation    Hip external rotation    Knee flexion    Knee extension    Ankle dorsiflexion    Ankle plantarflexion    Ankle inversion    Ankle eversion     (Blank rows = not tested)  LOWER EXTREMITY MMT:  Grossly 3+/5  MMT Right eval  Left eval  Hip flexion    Hip extension    Hip abduction    Hip adduction    Hip internal rotation    Hip external rotation    Knee flexion    Knee extension    Ankle dorsiflexion    Ankle plantarflexion    Ankle inversion    Ankle eversion     (Blank rows = not tested)  LUMBAR SPECIAL TESTS:  deferred  FUNCTIONAL TESTS:  30 seconds chair stand test 4 reps with UE support needed   08/20/22 0001  Standardized Balance Assessment  Standardized Balance Assessment Berg Balance Test  Berg Balance Test  Sit to Stand 2  Standing Unsupported 3  Sitting with Back Unsupported but Feet Supported on Floor or Stool 4  Stand to Sit 3  Transfers 3  Standing Unsupported with Eyes Closed 3  Standing Unsupported with Feet Together 1  From Standing, Reach Forward with Outstretched Arm 1  From Standing Position, Pick up Object from Floor 0  From Standing Position, Turn to Look Behind Over each Shoulder 2  Turn 360 Degrees 2  Standing Unsupported, Alternately Place Feet on Step/Stool 0  Standing Unsupported, One Foot in Front 0  Standing on One Leg 0  Total Score 24   GAIT: Distance walked: 38ft x2 Assistive device utilized: Single point cane Level of assistance: SBA Comments: slow cadence with B varus deformity  TODAY'S TREATMENT:  DATE: 08/20/22 Eval only   PATIENT EDUCATION:  Education details: Discussed eval findings, rehab rationale and POC and patient is in agreement  Person educated: Patient and Caregiver spouse Education method: Explanation Education comprehension: verbalized understanding and needs further education  HOME EXERCISE PROGRAM: UTA  ASSESSMENT:  CLINICAL IMPRESSION: Patient is a 82 y.o. male who was seen today for physical therapy evaluation and treatment for balance deficits resulting in several falls.  Past medical history shows  several referrals to neurology to assess balance issues however patient has not attended those appointments.  He was recently issued a rollator walker for safety and fall prevention however has declined to use it.  Patient presents with marked lower extremity weakness and generalized decrease mobility and stiffness.  Berg balance test was assessed however patient required multiple rest periods to accommodate issues of anxiety as well as liable behavior.  Vital signs were monitored throughout assessment with blood pressure oxygen and heart rate all remaining within normal limits.  Due to patient's anxiety level he felt he could not drive home and reached out to his daughter for transportation.  I did speak with her regarding results of assessment reassuring her vital signs were all within normal parameters.  At this time patient and family feel home health PT would be a better option which I agree with.  OBJECTIVE IMPAIRMENTS: Abnormal gait, decreased activity tolerance, decreased balance, decreased coordination, decreased endurance, decreased knowledge of condition, decreased knowledge of use of DME, decreased mobility, difficulty walking, decreased ROM, decreased strength, decreased safety awareness, improper body mechanics, postural dysfunction, and anxiety .   ACTIVITY LIMITATIONS: carrying, lifting, bending, sitting, standing, squatting, and stairs  PERSONAL FACTORS: Age, Behavior pattern, and Time since onset of injury/illness/exacerbation are also affecting patient's functional outcome.   REHAB POTENTIAL: Poor due to anxiety and history of poor compliance with medical recommendations  CLINICAL DECISION MAKING: Evolving/moderate complexity  EVALUATION COMPLEXITY: Low   GOALS: Goals reviewed with patient? No  SHORT TERM GOALS: Target date: 09/04/2022    Refer to HHPT Baseline: Not appropriate for OPPT at this time Goal status: INITIAL  PLAN:  PT FREQUENCY: 1w1  PT DURATION: 1  week  PLANNED INTERVENTIONS: Therapeutic exercises, Therapeutic activity, Neuromuscular re-education, Balance training, Gait training, Patient/Family education, Self Care, Joint mobilization, Stair training, DME instructions, and Re-evaluation.  PLAN FOR NEXT SESSION: DC to HHPT   Hildred Laser, PT 08/21/2022, 10:31 AM   Referring diagnosis? Frequent falls Treatment diagnosis? (if different than referring diagnosis) frequent falls What was this (referring dx) caused by? []  Surgery [x]  Fall []  Ongoing issue []  Arthritis []  Other: ____________  Laterality: []  Rt []  Lt [x]  Both  Check all possible CPT codes:  *CHOOSE 10 OR LESS*    []  40981 (Therapeutic Exercise)  []  92507 (SLP Treatment)  []  19147 (Neuro Re-ed)   []  92526 (Swallowing Treatment)   [x]  82956 (Gait Training)   []  [id number] (Cognitive Training, 1st 15 minutes) []  97140 (Manual Therapy)   []  97130 (Cognitive Training, each add'l 15 minutes)  []  97164 (Re-evaluation)                              []  Other, List CPT Code ____________  []  97530 (Therapeutic Activities)     []  97535 (Self Care)   []  All codes above (97110 - 97535)  []  97012 (Mechanical Traction)  []  97014 (E-stim Unattended)  []  97032 (E-stim manual)  []  97033 (Ionto)  []   16109 (Ultrasound) []  97750 (Physical Performance Training) []  [id number] (Aquatic Therapy) []  97016 (Vasopneumatic Device) []  [id number] (Paraffin) []  97034 (Contrast Bath) []  [phone] (Wound Care 1st 20 sq cm) []  97598 (Wound Care each add'l 20 sq cm) []  97760 (Orthotic Fabrication, Fitting, Training Initial) []  [phone] (Prosthetic Management and Training Initial) []  [id number] (Orthotic or Prosthetic Training/ Modification Subsequent)

## 2022-08-17 ENCOUNTER — Ambulatory Visit: Payer: Medicare HMO

## 2022-08-20 ENCOUNTER — Other Ambulatory Visit: Payer: Self-pay

## 2022-08-20 ENCOUNTER — Ambulatory Visit: Payer: Medicare HMO | Attending: Emergency Medicine

## 2022-08-20 DIAGNOSIS — R296 Repeated falls: Secondary | ICD-10-CM | POA: Insufficient documentation

## 2022-08-20 DIAGNOSIS — R2681 Unsteadiness on feet: Secondary | ICD-10-CM | POA: Diagnosis not present

## 2022-08-20 DIAGNOSIS — R2689 Other abnormalities of gait and mobility: Secondary | ICD-10-CM | POA: Insufficient documentation

## 2022-08-20 DIAGNOSIS — Z9181 History of falling: Secondary | ICD-10-CM | POA: Insufficient documentation

## 2022-08-20 DIAGNOSIS — M6281 Muscle weakness (generalized): Secondary | ICD-10-CM | POA: Diagnosis not present

## 2022-08-24 ENCOUNTER — Telehealth: Payer: Medicare HMO

## 2022-08-24 ENCOUNTER — Telehealth: Payer: Self-pay

## 2022-08-24 NOTE — Telephone Encounter (Signed)
   CCM RN Visit Note   01/03/23 Name: [name] MRN: [id number]      DOB: Feb 15, 1941  Subjective: [name] is a 82 y.o. year old male who is a primary care patient of Edwina Barth, MD. The patient was referred to the Chronic Care Management team for assistance with care management needs subsequent to provider initiation of CCM services and plan of care.      An unsuccessful outreach attempt was made today for a scheduled CCM visit.   PLAN: A HIPAA compliant phone message was left for the patient providing contact information and requesting a return call.    [facility]/Chronic Care Management [phone]

## 2022-09-13 ENCOUNTER — Ambulatory Visit: Payer: Medicare HMO | Admitting: Cardiovascular Disease

## 2022-09-21 NOTE — Chronic Care Management (AMB) (Signed)
  Chronic Care Management   CCM RN Visit Note   Name: Tymeir Weathington MRN: [id number] DOB: [birth year]  Subjective: Lyncoln Ledgerwood is a 82 y.o. year old male who is a primary care patient of Georgina Quint, MD. The patient was referred to the Chronic Care Management team for assistance with care management needs subsequent to provider initiation of CCM services and plan of care.

## 2022-09-24 ENCOUNTER — Ambulatory Visit (INDEPENDENT_AMBULATORY_CARE_PROVIDER_SITE_OTHER): Payer: Medicare HMO

## 2022-09-24 VITALS — BP 124/80 | HR 66 | Temp 97.8°F | Ht 66.0 in | Wt 173.4 lb

## 2022-09-24 DIAGNOSIS — Z0000 Encounter for general adult medical examination without abnormal findings: Secondary | ICD-10-CM | POA: Diagnosis not present

## 2022-09-24 DIAGNOSIS — Z599 Problem related to housing and economic circumstances, unspecified: Secondary | ICD-10-CM

## 2022-09-24 NOTE — Patient Instructions (Addendum)
[name] , Thank you for taking time to come for your Medicare Wellness Visit. I appreciate your ongoing commitment to your health goals. Please review the following plan we discussed and let me know if I can assist you in the future.   Referrals/Orders/Follow-Ups/Clinician Recommendations: No  This is a list of the screening recommended for you and due dates:  Health Maintenance  Topic Date Due   Complete foot exam   Never done   Eye exam for diabetics  Never done   Yearly kidney health urinalysis for diabetes  Never done   Hemoglobin A1C  09/04/2021   COVID-19 Vaccine (4 - 2023-24 season) 10/20/2021   Flu Shot  09/20/2022   Zoster (Shingles) Vaccine (1 of 2) 10/30/2022*   Pneumonia Vaccine (2 of 2 - PPSV23 or PCV20) 03/08/2023*   Yearly kidney function blood test for diabetes  02/28/2023   Medicare Annual Wellness Visit  09/24/2023   HPV Vaccine  Aged Out   DTaP/Tdap/Td vaccine  Discontinued   Hepatitis C Screening  Discontinued  *Topic was postponed. The date shown is not the original due date.    Advanced directives: (Declined) Advance directive discussed with you today. Even though you declined this today, please call our office should you change your mind, and we can give you the proper paperwork for you to fill out.  Next Medicare Annual Wellness Visit scheduled for next year: No  Preventive Care 67 Years and Older, Male  Preventive care refers to lifestyle choices and visits with your health care provider that can promote health and wellness. What does preventive care include? A yearly physical exam. This is also called an annual well check. Dental exams once or twice a year. Routine eye exams. Ask your health care provider how often you should have your eyes checked. Personal lifestyle choices, including: Daily care of your teeth and gums. Regular physical activity. Eating a healthy diet. Avoiding tobacco and drug use. Limiting alcohol use. Practicing safe  sex. Taking low doses of aspirin every day. Taking vitamin and mineral supplements as recommended by your health care provider. What happens during an annual well check? The services and screenings done by your health care provider during your annual well check will depend on your age, overall health, lifestyle risk factors, and family history of disease. Counseling  Your health care provider may ask you questions about your: Alcohol use. Tobacco use. Drug use. Emotional well-being. Home and relationship well-being. Sexual activity. Eating habits. History of falls. Memory and ability to understand (cognition). Work and work Astronomer. Screening  You may have the following tests or measurements: Height, weight, and BMI. Blood pressure. Lipid and cholesterol levels. These may be checked every 5 years, or more frequently if you are over 30 years old. Skin check. Lung cancer screening. You may have this screening every year starting at age 59 if you have a 30-pack-year history of smoking and currently smoke or have quit within the past 15 years. Fecal occult blood test (FOBT) of the stool. You may have this test every year starting at age 67. Flexible sigmoidoscopy or colonoscopy. You may have a sigmoidoscopy every 5 years or a colonoscopy every 10 years starting at age 40. Prostate cancer screening. Recommendations will vary depending on your family history and other risks. Hepatitis C blood test. Hepatitis B blood test. Sexually transmitted disease (STD) testing. Diabetes screening. This is done by checking your blood sugar (glucose) after you have not eaten for a while (fasting). You may  have this done every 1-3 years. Abdominal aortic aneurysm (AAA) screening. You may need this if you are a current or former smoker. Osteoporosis. You may be screened starting at age 72 if you are at high risk. Talk with your health care provider about your test results, treatment options, and if  necessary, the need for more tests. Vaccines  Your health care provider may recommend certain vaccines, such as: Influenza vaccine. This is recommended every year. Tetanus, diphtheria, and acellular pertussis (Tdap, Td) vaccine. You may need a Td booster every 10 years. Zoster vaccine. You may need this after age 10. Pneumococcal 13-valent conjugate (PCV13) vaccine. One dose is recommended after age 3. Pneumococcal polysaccharide (PPSV23) vaccine. One dose is recommended after age 92. Talk to your health care provider about which screenings and vaccines you need and how often you need them. This information is not intended to replace advice given to you by your health care provider. Make sure you discuss any questions you have with your health care provider. Document Released: 03/04/2015 Document Revised: 10/26/2015 Document Reviewed: 12/07/2014 Elsevier Interactive Patient Education  2017 [employer].  Fall Prevention in the Home Falls can cause injuries. They can happen to people of all ages. There are many things you can do to make your home safe and to help prevent falls. What can I do on the outside of my home? Regularly fix the edges of walkways and driveways and fix any cracks. Remove anything that might make you trip as you walk through a door, such as a raised step or threshold. Trim any bushes or trees on the path to your home. Use bright outdoor lighting. Clear any walking paths of anything that might make someone trip, such as rocks or tools. Regularly check to see if handrails are loose or broken. Make sure that both sides of any steps have handrails. Any raised decks and porches should have guardrails on the edges. Have any leaves, snow, or ice cleared regularly. Use sand or salt on walking paths during winter. Clean up any spills in your garage right away. This includes oil or grease spills. What can I do in the bathroom? Use night lights. Install grab bars by the toilet  and in the tub and shower. Do not use towel bars as grab bars. Use non-skid mats or decals in the tub or shower. If you need to sit down in the shower, use a plastic, non-slip stool. Keep the floor dry. Clean up any water that spills on the floor as soon as it happens. Remove soap buildup in the tub or shower regularly. Attach bath mats securely with double-sided non-slip rug tape. Do not have throw rugs and other things on the floor that can make you trip. What can I do in the bedroom? Use night lights. Make sure that you have a light by your bed that is easy to reach. Do not use any sheets or blankets that are too big for your bed. They should not hang down onto the floor. Have a firm chair that has side arms. You can use this for support while you get dressed. Do not have throw rugs and other things on the floor that can make you trip. What can I do in the kitchen? Clean up any spills right away. Avoid walking on wet floors. Keep items that you use a lot in easy-to-reach places. If you need to reach something above you, use a strong step stool that has a grab bar. Keep electrical cords  out of the way. Do not use floor polish or wax that makes floors slippery. If you must use wax, use non-skid floor wax. Do not have throw rugs and other things on the floor that can make you trip. What can I do with my stairs? Do not leave any items on the stairs. Make sure that there are handrails on both sides of the stairs and use them. Fix handrails that are broken or loose. Make sure that handrails are as long as the stairways. Check any carpeting to make sure that it is firmly attached to the stairs. Fix any carpet that is loose or worn. Avoid having throw rugs at the top or bottom of the stairs. If you do have throw rugs, attach them to the floor with carpet tape. Make sure that you have a light switch at the top of the stairs and the bottom of the stairs. If you do not have them, ask someone to add  them for you. What else can I do to help prevent falls? Wear shoes that: Do not have high heels. Have rubber bottoms. Are comfortable and fit you well. Are closed at the toe. Do not wear sandals. If you use a stepladder: Make sure that it is fully opened. Do not climb a closed stepladder. Make sure that both sides of the stepladder are locked into place. Ask someone to hold it for you, if possible. Clearly mark and make sure that you can see: Any grab bars or handrails. First and last steps. Where the edge of each step is. Use tools that help you move around (mobility aids) if they are needed. These include: Canes. Walkers. Scooters. Crutches. Turn on the lights when you go into a dark area. Replace any light bulbs as soon as they burn out. Set up your furniture so you have a clear path. Avoid moving your furniture around. If any of your floors are uneven, fix them. If there are any pets around you, be aware of where they are. Review your medicines with your doctor. Some medicines can make you feel dizzy. This can increase your chance of falling. Ask your doctor what other things that you can do to help prevent falls. This information is not intended to replace advice given to you by your health care provider. Make sure you discuss any questions you have with your health care provider. Document Released: 12/02/2008 Document Revised: 07/14/2015 Document Reviewed: 03/12/2014 Elsevier Interactive Patient Education  2017 [employer].

## 2022-09-24 NOTE — Progress Notes (Signed)
Subjective:   [name] is a 82 y.o. male who presents for Medicare Annual/Subsequent preventive examination.  Visit Complete: In person  Review of Systems     Cardiac Risk Factors include: advanced age (>73men, >8 women);dyslipidemia;family history of premature cardiovascular disease;hypertension;male gender;sedentary lifestyle     Objective:    Today's Vitals   09/24/22 1326  BP: 124/80  Pulse: 66  Temp: 97.8 F (36.6 C)  TempSrc: Temporal  SpO2: 97%  Weight: 173 lb 6.4 oz (78.7 kg)  Height: 5\' 6"  (1.676 m)  PainSc: 0-No pain   Body mass index is 27.99 kg/m.     09/24/2022    2:41 PM 12/27/2021   11:31 AM 09/18/2021   10:31 AM 08/08/2020   11:00 PM 11/05/2017    7:04 AM 11/04/2017    2:39 PM 04/14/2017    9:52 PM  Advanced Directives  Does Patient Have a Medical Advance Directive? No No No No  No No  Would patient like information on creating a medical advance directive? No - Patient declined  No - Patient declined No - Patient declined No - Patient declined  No - Patient declined    Current Medications (verified) Outpatient Encounter Medications as of 09/24/2022  Medication Sig   acetaminophen (TYLENOL) 325 MG tablet Take 325 mg by mouth daily.   amLODipine (NORVASC) 5 MG tablet Take 1 tablet (5 mg total) by mouth daily.   apixaban (ELIQUIS) 5 MG TABS tablet Take 1 tablet (5 mg total) by mouth 2 (two) times daily.   carvedilol (COREG) 12.5 MG tablet Take 1 tablet (12.5 mg total) by mouth 2 (two) times daily.   HYDROcodone-acetaminophen (NORCO) 5-325 MG tablet Take 1 tablet by mouth every 6 (six) hours as needed for moderate pain.   pantoprazole (PROTONIX) 40 MG tablet Take 1 tablet (40 mg total) by mouth daily.   rosuvastatin (CRESTOR) 40 MG tablet Take 1 tablet (40 mg total) by mouth daily.   valsartan (DIOVAN) 160 MG tablet Take 1 tablet (160 mg total) by mouth daily.   No facility-administered encounter medications on file as of 09/24/2022.    Allergies  (verified) Lipitor [atorvastatin], Morphine and codeine, and Pork-derived products   History: Past Medical History:  Diagnosis Date   Blurred vision 05/17/2016   CAD (coronary artery disease), native coronary artery 06/14/2008   a. BMS to LCx & OM 2008 b. 03/2017 CABG x 4 (LIMA to LAD, SVG to diag 1, SVG to distal Circ, SVG to PDA)   Chest pain 04/16/2012   Diplopia    Echocardiogram    Echo 10/19: mod LVH, EF 55-60, no RWMA, Gr 1 DD, trivial AI, MAC, mod LAE, prob small post effusion   Essential hypertension 05/17/2016   Hyperlipidemia with target low density lipoprotein (LDL) cholesterol less than 70 mg/dL 6/57/8469   Qualifier: Diagnosis of  By: Bascom Levels, RMA, [name]     Hypertension    Hypertensive heart disease 06/14/2008   Qualifier: Diagnosis of  By: Bascom Levels, RMA, [name]     Hypertensive urgency, malignant 04/16/2012   Hypokalemia 04/14/2017   Internal hemorrhoids 04/19/2012   Ischemic stroke (HCC) 05/02/2016   Non-ST elevation (NSTEMI) myocardial infarction [facility])    pandiverticulosis 04/22/2012   12/17/2011. [facility]. Charna Elizabeth MD. Colonoscopy. Moderate sized internal hemorrhoids and extensive pandiverticulosis. Repeat 5 years.     Pericardial effusion    Echo 11/19: mild focal basal septal hypertrophy, EF 55-60, Gr 2 DD, trivial AI, trivial TR, trivial PI, small to  mod eff post to heart - no evidence of RV collapse.>> repeat limited echo in 03/2018 // Echo 03/2018: EF 55-60, mild LVH, +diastolic dysfunction, no RWMA, PASP 23, trivial pericardial effusion    Polycythemia vera(238.4) 04/17/2012   S/P CABG x 4 04/17/2017   Syncope and collapse 04/17/2012   Pt syncopized while sitting in bed giving history @ time of admission to hospital Tachycardic (appeared sinus) to 120s-130s and hypotensive to 50s/30s.  Unresponsive initially >> Spontaneously resolved after 2-3 minutes >> return to baseline ~30 minutes    Vertigo    Past Surgical History:  Procedure Laterality Date    CORONARY ARTERY BYPASS GRAFT N/A 04/17/2017   Procedure: CORONARY ARTERY BYPASS GRAFTING (CABG) x4 , using left internal mammary artery  to LAD and right leg greater saphenous vein harvested endoscopically  to PDA, Diagonal I and Circumflex;  Surgeon: Delight Ovens, MD;  Location: [facility] OR;  Service: Open Heart Surgery;  Laterality: N/A;   CORONARY ARTERY BYPASS GRAFT  2020   CORONARY STENT PLACEMENT     LEFT HEART CATH AND CORONARY ANGIOGRAPHY N/A 04/16/2017   Procedure: LEFT HEART CATH AND CORONARY ANGIOGRAPHY;  Surgeon: Tonny Bollman, MD;  Location: [facility] INVASIVE CV LAB;  Service: Cardiovascular;  Laterality: N/A;   TEE WITHOUT CARDIOVERSION N/A 04/17/2017   Procedure: TRANSESOPHAGEAL ECHOCARDIOGRAM (TEE);  Surgeon: Delight Ovens, MD;  Location: [facility] OR;  Service: Open Heart Surgery;  Laterality: N/A;   Family History  Problem Relation Age of Onset   Heart disease Mother    Cancer Sister    Social History   Socioeconomic History   Marital status: Married    Spouse name: Not on file   Number of children: Not on file   Years of education: Not on file   Highest education level: Not on file  Occupational History   Not on file  Tobacco Use   Smoking status: Never   Smokeless tobacco: Never  Vaping Use   Vaping status: Never Used  Substance and Sexual Activity   Alcohol use: No    Alcohol/week: 0.0 standard drinks of alcohol   Drug use: No   Sexual activity: Not on file  Other Topics Concern   Not on file  Social History Narrative   Lives in [place] with Wife and 2 sons.  From [place].  To [place] ~2000.     Currently retired but worked in Theatre manager   Social Determinants of Corporate investment banker Strain: High Risk (09/24/2022)   Overall Financial Resource Strain (CARDIA)    Difficulty of Paying Living Expenses: Very hard  Food Insecurity: No Food Insecurity (09/24/2022)   Hunger Vital Sign    Worried About Running Out of Food in the Last Year: Never true     Ran Out of Food in the Last Year: Never true  Transportation Needs: No Transportation Needs (09/24/2022)   PRAPARE - Administrator, Civil Service (Medical): No    Lack of Transportation (Non-Medical): No  Physical Activity: Inactive (09/24/2022)   Exercise Vital Sign    Days of Exercise per Week: 0 days    Minutes of Exercise per Session: 0 min  Stress: No Stress Concern Present (09/24/2022)   [employer] of Occupational Health - Occupational Stress Questionnaire    Feeling of Stress : Not at all  Social Connections: Socially Integrated (09/24/2022)   Social Connection and Isolation Panel [NHANES]    Frequency of Communication with Friends and Family: More than  three times a week    Frequency of Social Gatherings with Friends and Family: Three times a week    Attends Religious Services: More than 4 times per year    Active Member of Clubs or Organizations: No    Attends Engineer, structural: More than 4 times per year    Marital Status: Married    Tobacco Counseling Counseling given: Not Answered   Clinical Intake:  Pre-visit preparation completed: Yes  Pain : No/denies pain Pain Score: 0-No pain     BMI - recorded: 27.99 Nutritional Status: BMI 25 -29 Overweight Nutritional Risks: None Diabetes: No  How often do you need to have someone help you when you read instructions, pamphlets, or other written materials from your doctor or pharmacy?: 1 - Never What is the last grade level you completed in school?: HSG  Interpreter Needed?: No  Information entered by :: Leimomi Zervas N. Lorelle Macaluso, LPN.   Activities of Daily Living    09/24/2022    2:42 PM  In your present state of health, do you have any difficulty performing the following activities:  Hearing? 0  Vision? 0  Difficulty concentrating or making decisions? 0  Walking or climbing stairs? 1  Dressing or bathing? 0  Doing errands, shopping? 1  Preparing Food and eating ? Y  Using the Toilet? N   In the past six months, have you accidently leaked urine? N  Do you have problems with loss of bowel control? N  Managing your Medications? N  Managing your Finances? N  Housekeeping or managing your Housekeeping? Y    Patient Care Team: Georgina Quint, MD as PCP - General (Internal Medicine) Tonny Bollman, MD as PCP - Cardiology (Cardiology) Elvina Sidle, MD (Family Medicine) [facility] as Consulting Physician (Ophthalmology) Juanell Fairly, RN as Case Manager  Indicate any recent Medical Services you may have received from other than [facility] providers in the past year (date may be approximate).     Assessment:   This is a routine wellness examination for Ora.  Hearing/Vision screen Hearing Screening - Comments:: Patient denied any hearing difficulty.   No hearing aids.  Vision Screening - Comments:: Patient does not wear any corrective lenses/contacts.   No eye exam due to financial strain/coverage.   Dietary issues and exercise activities discussed:     Goals Addressed   None   Depression Screen    09/24/2022    2:38 PM 07/30/2022    1:55 PM 03/07/2022   10:38 AM 09/18/2021   10:43 AM 11/30/2020    1:17 PM 08/18/2020    3:12 PM 05/31/2020    1:10 PM  PHQ 2/9 Scores  PHQ - 2 Score 0 0 0 0 0 0 3  PHQ- 9 Score 3          Fall Risk    09/24/2022    2:42 PM 07/30/2022    1:54 PM 03/07/2022   10:38 AM 09/18/2021   10:45 AM 11/30/2020    1:17 PM  Fall Risk   Falls in the past year? 1 1 0 0 0  Number falls in past yr: 1 1 0 0 0  Injury with Fall? 1 1 0 0 0  Risk for fall due to : History of fall(s);Impaired balance/gait;Orthopedic patient Impaired balance/gait No Fall Risks History of fall(s);Impaired balance/gait   Follow up Education provided;Falls prevention discussed;Falls evaluation completed Falls evaluation completed Falls evaluation completed Falls evaluation completed     MEDICARE RISK AT  HOME:  Medicare Risk at Home -  09/24/22 1443     Any stairs in or around the home? No    If so, are there any without handrails? No    Home free of loose throw rugs in walkways, pet beds, electrical cords, etc? Yes    Adequate lighting in your home to reduce risk of falls? Yes    Life alert? No    Use of a cane, walker or w/c? Yes    Grab bars in the bathroom? No    Shower chair or bench in shower? Yes    Elevated toilet seat or a handicapped toilet? No             TIMED UP AND GO:  Was the test performed?  Yes  Length of time to ambulate 10 feet: 12 sec Gait slow and steady with assistive device and daughter held on to father.  Cognitive Function:    09/18/2021   10:47 AM  MMSE - Mini Mental State Exam  Orientation to time 5  Orientation to Place 5  Registration 3  Attention/ Calculation 5  Recall 3  Language- name 2 objects 2  Language- repeat 1  Language- follow 3 step command 3  Language- read & follow direction 1  Write a sentence 1  Copy design 1  Total score 30        09/24/2022    2:43 PM  6CIT Screen  What Year? 0 points  What month? 0 points  What time? 0 points  Count back from 20 0 points  Months in reverse 0 points  Repeat phrase 0 points  Total Score 0 points    Immunizations Immunization History  Administered Date(s) Administered   Fluad Quad(high Dose 65+) 11/30/2020, 03/07/2022   PFIZER(Purple Top)SARS-COV-2 Vaccination 04/21/2019, 05/19/2019, 11/24/2019   Pneumococcal Conjugate-13 05/09/2018    TDAP status: Due, Education has been provided regarding the importance of this vaccine. Advised may receive this vaccine at local pharmacy or Health Dept. Aware to provide a copy of the vaccination record if obtained from local pharmacy or Health Dept. Verbalized acceptance and understanding.  Flu Vaccine status: Up to date  Pneumococcal vaccine status: Up to date  Covid-19 vaccine status: Completed vaccines  Qualifies for Shingles Vaccine? Yes   Zostavax completed No    Shingrix Completed?: No.    Education has been provided regarding the importance of this vaccine. Patient has been advised to call insurance company to determine out of pocket expense if they have not yet received this vaccine. Advised may also receive vaccine at local pharmacy or Health Dept. Verbalized acceptance and understanding.  Screening Tests Health Maintenance  Topic Date Due   FOOT EXAM  Never done   OPHTHALMOLOGY EXAM  Never done   Diabetic kidney evaluation - Urine ACR  Never done   HEMOGLOBIN A1C  09/04/2021   COVID-19 Vaccine (4 - 2023-24 season) 10/20/2021   INFLUENZA VACCINE  09/20/2022   Zoster Vaccines- Shingrix (1 of 2) 10/30/2022 (Originally 05/08/1992)   Pneumonia Vaccine 49+ Years old (2 of 2 - PPSV23 or PCV20) 03/08/2023 (Originally 05/09/2019)   Diabetic kidney evaluation - eGFR measurement  02/28/2023   Medicare Annual Wellness (AWV)  09/24/2023   HPV VACCINES  Aged Out   DTaP/Tdap/Td  Discontinued   Hepatitis C Screening  Discontinued    Health Maintenance  Health Maintenance Due  Topic Date Due   FOOT EXAM  Never done   OPHTHALMOLOGY EXAM  Never done  Diabetic kidney evaluation - Urine ACR  Never done   HEMOGLOBIN A1C  09/04/2021   COVID-19 Vaccine (4 - 2023-24 season) 10/20/2021   INFLUENZA VACCINE  09/20/2022    Colorectal cancer screening: No longer required.   Lung Cancer Screening: (Low Dose CT Chest recommended if Age 6-80 years, 20 pack-year currently smoking OR have quit w/in 15years.) does not qualify.   Lung Cancer Screening Referral: no  Additional Screening:  Hepatitis C Screening: does qualify; Completed 05/31/2020  Vision Screening: Recommended annual ophthalmology exams for early detection of glaucoma and other disorders of the eye. Is the patient up to date with their annual eye exam?  No  Who is the provider or what is the name of the office in which the patient attends annual eye exams? Patient declined due to financial strain  with paying out of pocket for exam. If pt is not established with a provider, would they like to be referred to a provider to establish care? No .   Dental Screening: Recommended annual dental exams for proper oral hygiene  Diabetic Foot Exam: No  Community Resource Referral / Chronic Care Management: CRR required this visit?  No   CCM required this visit?  PCP informed of CCM need     Plan:     I have personally reviewed and noted the following in the patient's chart:   Medical and social history Use of alcohol, tobacco or illicit drugs  Current medications and supplements including opioid prescriptions. Patient is currently taking opioid prescriptions. Information provided to patient regarding non-opioid alternatives. Patient advised to discuss non-opioid treatment plan with their provider. Functional ability and status Nutritional status Physical activity Advanced directives List of other physicians Hospitalizations, surgeries, and ER visits in previous 12 months Vitals Screenings to include cognitive, depression, and falls Referrals and appointments  In addition, I have reviewed and discussed with patient certain preventive protocols, quality metrics, and best practice recommendations. A written personalized care plan for preventive services as well as general preventive health recommendations were provided to patient.     Mickeal Needy, LPN   4/0/9811   After Visit Summary: Printed and given to patient.  Nurse Notes: Normal cognitive status assessed by direct observation by this Nurse Health Advisor. No abnormalities found.

## 2022-09-25 ENCOUNTER — Telehealth: Payer: Self-pay | Admitting: *Deleted

## 2022-09-25 NOTE — Progress Notes (Signed)
  Care Coordination   Note   09/25/2022 Name: [name] MRN: [id number] DOB: 1941/02/04  [name] is a 82 y.o. year old male who sees Sagardia, Eilleen Kempf, MD for primary care. I reached out to Elvera Maria by phone today to offer care coordination services.  [name] was given information about Care Coordination services today including:   The Care Coordination services include support from the care team which includes your Nurse Coordinator, Clinical Social Worker, or Pharmacist.  The Care Coordination team is here to help remove barriers to the health concerns and goals most important to you. Care Coordination services are voluntary, and the patient may decline or stop services at any time by request to their care team member.   Care Coordination Consent Status: Patient agreed to services and verbal consent obtained.   Follow up plan:  Telephone appointment with care coordination team member scheduled for:  09/28/22  Encounter Outcome:  patient scheduled   [facility] Coordination Care Guide  Direct Dial: [phone]

## 2022-09-28 ENCOUNTER — Ambulatory Visit: Payer: Self-pay

## 2022-09-28 NOTE — Patient Outreach (Signed)
  Care Coordination   Initial Visit Note   09/28/2022 Name: [name] MRN: [id number] DOB: 1940-12-10  [name] is a 82 y.o. year old male who sees Sagardia, Eilleen Kempf, MD for primary care. I  spoke with patients daughter [name] to assist with care coordination needs.  What matters to the patients health and wellness today?  Identify health plans to assist with dental and vision costs    Goals Addressed             This Visit's Progress    Care Coordination Activities       Care Coordination Interventions: Discussed the patient has a difficult time affording dental and vision services due to high copay amounts. Patient is interested in enrolling in a dual complete plan he saw a commercial for Educated patients daughter that a dual complete plan is for individuals who have both Medicare and Medicaid Reviewed eligibility guidelines to qualify for Medicaid as patient only has Medicare Provided patients daughter with a brochure the [employer] advising they can consult with an insurance expert to determine if switching to a different health plan will provide better dental and vision coverage         SDOH assessments and interventions completed:  No     Care Coordination Interventions:  Yes, provided   Interventions Today    Flowsheet Row Most Recent Value  Chronic Disease   Chronic disease during today's visit Hypertension (HTN), Congestive Heart Failure (CHF)  General Interventions   General Interventions Discussed/Reviewed General Interventions Discussed, IT trainer Provided Provided Education, Provided Web-based Education  Provided Engineer, petroleum [employer]        Follow up plan: Follow up call scheduled for 8/14    Encounter Outcome:  Pt. Visit Completed   [name], BSW, CDP Social Worker, Certified Dementia Practitioner [facility] Care Management  Care  Coordination [phone]

## 2022-09-28 NOTE — Patient Instructions (Signed)
Visit Information  Thank you for taking time to visit with me today. Please don't hesitate to contact me if I can be of assistance to you.   Following are the goals we discussed today:  - Review provided resource for    Our next appointment is by telephone on 8/14 at 9:30  Please call the care guide team at [phone] if you need to cancel or reschedule your appointment.   If you are experiencing a Mental Health or Behavioral Health Crisis or need someone to talk to, please call 1-800-273-TALK (toll free, 24 hour hotline) go to [facility] [address], [place] [phone]) call 911  Patient verbalizes understanding of instructions and care plan provided today and agrees to view in MyChart. Active MyChart status and patient understanding of how to access instructions and care plan via MyChart confirmed with patient.     [name], BSW, CDP Social Worker, Certified Dementia Practitioner [facility] Care Management  Care Coordination [phone]

## 2022-10-03 ENCOUNTER — Ambulatory Visit: Payer: Self-pay

## 2022-10-03 NOTE — Patient Instructions (Signed)
Visit Information  Thank you for taking time to visit with me today. Please don't hesitate to contact me if I can be of assistance to you.   Following are the goals we discussed today:  - Contact your primary care provider as needed  If you are experiencing a Mental Health or Behavioral Health Crisis or need someone to talk to, please call 1-800-273-TALK (toll free, 24 hour hotline) go to [facility] [address] [place] [phone]) call 911  Patient verbalizes understanding of instructions and care plan provided today and agrees to view in MyChart. Active MyChart status and patient understanding of how to access instructions and care plan via MyChart confirmed with patient.     No further follow up required: Please contact me as needed.  [name], BSW, CDP Social Worker, Certified Dementia Practitioner [facility] Care Management  Care Coordination [phone]

## 2022-10-03 NOTE — Patient Outreach (Signed)
  Care Coordination   Follow Up Visit Note   10/03/2022 Name: Mak Sneeringer MRN: [id number] DOB: Jul 15, 1940  Tywain Sulkowski is a 82 y.o. year old male who sees Sagardia, Eilleen Kempf, MD for primary care. I  spoke with the patients daughter [name] by phone to follow up on care coordination needs.  What matters to the patients health and wellness today?  Discussed health plan resources.    Goals Addressed             This Visit's Progress    COMPLETED: Care Coordination Activities       Care Coordination Interventions: Confirmed receipt of provided resources Discussed the patient would like to remain with his current health plan at this time Encouraged family to contact SW as needed with future resource needs         SDOH assessments and interventions completed:  No     Care Coordination Interventions:  Yes, provided   Interventions Today    Flowsheet Row Most Recent Value  Chronic Disease   Chronic disease during today's visit Hypertension (HTN), Congestive Heart Failure (CHF)  General Interventions   General Interventions Discussed/Reviewed General Interventions Reviewed        Follow up plan: No further intervention required.   Encounter Outcome:  Pt. Visit Completed   [name], BSW, CDP Social Worker, Certified Dementia Practitioner [facility] Care Management  Care Coordination [phone]

## 2022-11-15 ENCOUNTER — Emergency Department (HOSPITAL_COMMUNITY)
Admission: EM | Admit: 2022-11-15 | Discharge: 2022-11-15 | Disposition: A | Discharge status: Home / Self Care | Payer: Medicare HMO | Attending: Emergency Medicine | Admitting: Emergency Medicine

## 2022-11-15 ENCOUNTER — Emergency Department (HOSPITAL_COMMUNITY): Payer: Medicare HMO

## 2022-11-15 ENCOUNTER — Other Ambulatory Visit: Payer: Self-pay

## 2022-11-15 DIAGNOSIS — I491 Atrial premature depolarization: Secondary | ICD-10-CM | POA: Diagnosis not present

## 2022-11-15 DIAGNOSIS — Z7901 Long term (current) use of anticoagulants: Secondary | ICD-10-CM | POA: Insufficient documentation

## 2022-11-15 DIAGNOSIS — I10 Essential (primary) hypertension: Secondary | ICD-10-CM | POA: Diagnosis not present

## 2022-11-15 DIAGNOSIS — Z951 Presence of aortocoronary bypass graft: Secondary | ICD-10-CM | POA: Insufficient documentation

## 2022-11-15 DIAGNOSIS — G4489 Other headache syndrome: Secondary | ICD-10-CM | POA: Diagnosis not present

## 2022-11-15 DIAGNOSIS — R0789 Other chest pain: Secondary | ICD-10-CM | POA: Diagnosis not present

## 2022-11-15 DIAGNOSIS — R079 Chest pain, unspecified: Secondary | ICD-10-CM | POA: Diagnosis not present

## 2022-11-15 DIAGNOSIS — R918 Other nonspecific abnormal finding of lung field: Secondary | ICD-10-CM | POA: Diagnosis not present

## 2022-11-15 LAB — CBC WITH DIFFERENTIAL/PLATELET
Abs Immature Granulocytes: 0.02 10*3/uL (ref 0.00–0.07)
Basophils Absolute: 0.1 10*3/uL (ref 0.0–0.1)
Basophils Relative: 1 %
Eosinophils Absolute: 0.5 10*3/uL (ref 0.0–0.5)
Eosinophils Relative: 5 %
HCT: 39.4 % (ref 39.0–52.0)
Hemoglobin: 12.5 g/dL — ABNORMAL LOW (ref 13.0–17.0)
Immature Granulocytes: 0 %
Lymphocytes Relative: 27 %
Lymphs Abs: 2.4 10*3/uL (ref 0.7–4.0)
MCH: 27.6 pg (ref 26.0–34.0)
MCHC: 31.7 g/dL (ref 30.0–36.0)
MCV: 87 fL (ref 80.0–100.0)
Monocytes Absolute: 0.6 10*3/uL (ref 0.1–1.0)
Monocytes Relative: 6 %
Neutro Abs: 5.5 10*3/uL (ref 1.7–7.7)
Neutrophils Relative %: 61 %
Platelets: 248 10*3/uL (ref 150–400)
RBC: 4.53 MIL/uL (ref 4.22–5.81)
RDW: 14.4 % (ref 11.5–15.5)
WBC: 9.1 10*3/uL (ref 4.0–10.5)
nRBC: 0 % (ref 0.0–0.2)

## 2022-11-15 LAB — COMPREHENSIVE METABOLIC PANEL
ALT: 11 U/L (ref 0–44)
AST: 14 U/L — ABNORMAL LOW (ref 15–41)
Albumin: 3.2 g/dL — ABNORMAL LOW (ref 3.5–5.0)
Alkaline Phosphatase: 83 U/L (ref 38–126)
Anion gap: 13 (ref 5–15)
BUN: 11 mg/dL (ref 8–23)
CO2: 23 mmol/L (ref 22–32)
Calcium: 8.8 mg/dL — ABNORMAL LOW (ref 8.9–10.3)
Chloride: 102 mmol/L (ref 98–111)
Creatinine, Ser: 0.91 mg/dL (ref 0.61–1.24)
GFR, Estimated: >60 mL/min (ref >60)
Glucose, Bld: 97 mg/dL (ref 70–99)
Potassium: 3.2 mmol/L — ABNORMAL LOW (ref 3.5–5.1)
Sodium: 138 mmol/L (ref 135–145)
Total Bilirubin: 0.8 mg/dL (ref 0.3–1.2)
Total Protein: 6.8 g/dL (ref 6.5–8.1)

## 2022-11-15 LAB — TROPONIN I (HIGH SENSITIVITY)
Troponin I (High Sensitivity): 8 ng/L (ref <18)
Troponin I (High Sensitivity): 10 ng/L (ref <18)

## 2022-11-15 LAB — LIPASE, BLOOD: Lipase: 21 U/L (ref 11–51)

## 2022-11-15 NOTE — ED Triage Notes (Signed)
Pt had chest pain this morning at 11am while sitting down and arguing with wife. Pain is central to left side and improved with nitroglycerin. Pain is sharp and worse when the area is palpated. BP 133/100 P-72 with PACs.

## 2022-11-15 NOTE — ED Notes (Signed)
Wife is at bedside, patient denies chest pain at this time.

## 2022-11-15 NOTE — Discharge Instructions (Signed)
1.  At this time your EKG and your heart labs do not show a heart attack.  You need to follow-up with your doctor for recheck as soon as possible. 2.  Return to the emergency department if you get new, worsening or concerning symptoms.

## 2022-11-15 NOTE — ED Provider Notes (Signed)
[place] [facility] AT [facility] Provider Note   CSN: [id number] Arrival date & time: 11/15/22  1552     History  Chief Complaint  Patient presents with   Chest Pain    [name] is a 82 y.o. male.  HPI Patient reports he awakened from sleep this morning and was not having any chest pain.  He reports he got up to go into the living room and lie down on the couch.  He reports he started having an argument with his wife and she was yelling at him.  He reports that he became very upset and his blood pressure went high up to the 180s systolic.  He then started getting left-sided chest pain.  Reports that sharp and uncomfortable.  He walked over to his son's house who lives right next door.  Patient reports he continued to get worsening chest pain and then felt like he was so weak he could not really even stand up.  He reports he was nauseated at the time but he is not anymore.  EMS was called.  He reports he did get nitroglycerin from EMS which helped with the pain.  Patient reports he has a history of heart problems and is very concerned that his heart hurts.  No fever that he is aware of.  He reports he did have a cough a while ago but it has gone away.  No swelling or pain in the legs.    Home Medications Prior to Admission medications   Medication Sig Start Date End Date Taking? Authorizing Provider  acetaminophen (TYLENOL) 325 MG tablet Take 325 mg by mouth daily.    [provider]  amLODipine (NORVASC) 5 MG tablet Take 1 tablet (5 mg total) by mouth daily. 08/08/22   Tonny Bollman, MD  apixaban (ELIQUIS) 5 MG TABS tablet Take 1 tablet (5 mg total) by mouth 2 (two) times daily. 01/30/22   Tonny Bollman, MD  carvedilol (COREG) 12.5 MG tablet Take 1 tablet (12.5 mg total) by mouth 2 (two) times daily. 08/08/22   Tonny Bollman, MD  HYDROcodone-acetaminophen (NORCO) 5-325 MG tablet Take 1 tablet by mouth every 6 (six) hours as needed for moderate  pain. 07/30/22   Georgina Quint, MD  pantoprazole (PROTONIX) 40 MG tablet Take 1 tablet (40 mg total) by mouth daily. 03/07/21   Georgina Quint, MD  rosuvastatin (CRESTOR) 40 MG tablet Take 1 tablet (40 mg total) by mouth daily. 08/08/22   Tonny Bollman, MD  valsartan (DIOVAN) 160 MG tablet Take 1 tablet (160 mg total) by mouth daily. 08/08/22   Tonny Bollman, MD      Allergies    Lipitor [atorvastatin], Morphine and codeine, and Pork-derived products    Review of Systems   Review of Systems  Physical Exam Updated Vital Signs BP (!) 178/95   Pulse 88   Temp 98.5 F (36.9 C)   Resp 19   Ht 5\' 6"  (1.676 m)   Wt 78.7 kg   SpO2 100%   BMI 28.00 kg/m  Physical Exam Constitutional:      Comments: Patient is alert and nontoxic.  He is very anxious in appearance and borderline tearful.  Hyperventilating a little bit.  Well-nourished well-developed for age.  HENT:     Mouth/Throat:     Pharynx: Oropharynx is clear.  Eyes:     Extraocular Movements: Extraocular movements intact.  Cardiovascular:     Rate and Rhythm: Normal rate and regular rhythm.  Pulmonary:     Comments: Patient is hyperventilating mildly.  No appearance of respiratory distress.  Breath sounds are soft at the bases.  No significant crackle or wheeze. Abdominal:     General: There is no distension.     Palpations: Abdomen is soft.     Tenderness: There is no abdominal tenderness. There is no guarding.  Musculoskeletal:        General: No swelling or tenderness. Normal range of motion.     Right lower leg: No edema.     Left lower leg: No edema.  Skin:    General: Skin is warm and dry.  Neurological:     General: No focal deficit present.     Mental Status: He is oriented to person, place, and time.     Motor: No weakness.     Coordination: Coordination normal.  Psychiatric:     Comments: Very anxious.     ED Results / Procedures / Treatments   Labs (all labs ordered are listed, but only  abnormal results are displayed) Labs Reviewed  COMPREHENSIVE METABOLIC PANEL - Abnormal; Notable for the following components:      Result Value   Potassium 3.2 (*)    Calcium 8.8 (*)    Albumin 3.2 (*)    AST 14 (*)    All other components within normal limits  CBC WITH DIFFERENTIAL/PLATELET - Abnormal; Notable for the following components:   Hemoglobin 12.5 (*)    All other components within normal limits  LIPASE, BLOOD  TROPONIN I (HIGH SENSITIVITY)  TROPONIN I (HIGH SENSITIVITY)    EKG EKG Interpretation Date/Time:  Thursday November 15 2022 15:59:12 EDT Ventricular Rate:  69 PR Interval:  146 QRS Duration:  110 QT Interval:  413 QTC Calculation: 420 R Axis:   39  Text Interpretation: Sinus rhythm Paired ventricular premature complexes similar to previous in chest leads but PVCs predominate in inferior leads intertpretaion Confirmed by Arby Barrette [phone]) on 11/15/2022 4:06:55 PM  Radiology DG Chest Port 1 View  Result Date: 11/15/2022 CLINICAL DATA:  Chest pain EXAM: PORTABLE CHEST 1 VIEW COMPARISON:  12/27/2021 FINDINGS: Post sternotomy changes. Scarring at the left base. No acute airspace disease, pleural effusion or pneumothorax. Possible hiatal hernia IMPRESSION: No active disease.  Mild atelectasis or scarring at left base Electronically Signed   By: Jasmine Pang M.D.   On: 11/15/2022 19:24    Procedures Procedures    Medications Ordered in ED Medications - No data to display  ED Course/ Medical Decision Making/ A&P                                 Medical Decision Making Amount and/or Complexity of Data Reviewed Labs: ordered. Radiology: ordered.   EMR review indicates patient has history of CABG 2019.  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  Patient has had recurrent difficulty for a few years with leg weakness and fatigue.  He is anticoagulated due to a small PE identified in 2023.  Patient is presenting with  chest pain which he describes as having been triggered by a very stressful situation this morning.  Will proceed with diagnostic testing including troponin, basic lab work and chest x-ray.  I have reviewed EKG and did not identify acute appearing changes at this time.  Will continue to monitor closely.  Monitor shows a sinus rhythm with intermittent PVCs.  Current blood pressures are 130s over 80s.  Troponin 4.  Comprehensive metabolic panel normal except potassium 3.2, GFR greater than 60 white count 9.1 H&H 12.5 and 39.  Chest x-ray interpreted by radiology no acute findings.  EKG interpreted by myself no acute ischemic changes compared to previous.  At this time, with patient having had chest pain precipitated by very stressful event and diagnostic evaluation negative for ACS, I do feel patient is stable for discharge.  Patient has remained stable throughout his stay in the emergency department.  He is very anxious to leave and go home.  Patient was very anxious on arrival.  My recommendation is close follow-up with PCP and return for worsening or changing symptoms.  Patient agrees and stresses that he is ready for discharge.          Final Clinical Impression(s) / ED Diagnoses Final diagnoses:  Chest pain, unspecified type    Rx / DC Orders ED Discharge Orders     None         Arby Barrette, MD 11/15/22 2111

## 2023-01-21 ENCOUNTER — Ambulatory Visit: Payer: Medicare HMO | Attending: Cardiovascular Disease | Admitting: Cardiovascular Disease

## 2023-01-21 ENCOUNTER — Encounter: Payer: Self-pay | Admitting: Cardiovascular Disease

## 2023-01-21 VITALS — BP 130/80 | HR 64 | Ht 66.0 in | Wt 175.2 lb

## 2023-01-21 DIAGNOSIS — I5032 Chronic diastolic (congestive) heart failure: Secondary | ICD-10-CM | POA: Diagnosis not present

## 2023-01-21 DIAGNOSIS — I2510 Atherosclerotic heart disease of native coronary artery without angina pectoris: Secondary | ICD-10-CM | POA: Diagnosis not present

## 2023-01-21 DIAGNOSIS — I10 Essential (primary) hypertension: Secondary | ICD-10-CM

## 2023-01-21 DIAGNOSIS — I318 Other specified diseases of pericardium: Secondary | ICD-10-CM | POA: Diagnosis not present

## 2023-01-21 DIAGNOSIS — E782 Mixed hyperlipidemia: Secondary | ICD-10-CM

## 2023-01-21 NOTE — Assessment & Plan Note (Signed)
Blood pressure well-controlled on amlodipine, carvedilol, and valsartan.  Last labs demonstrated a creatinine of 0.91.

## 2023-01-21 NOTE — Patient Instructions (Signed)
Follow-Up: At [facility], you and your health needs are our priority.  As part of our continuing mission to provide you with exceptional heart care, we have created designated Provider Care Teams.  These Care Teams include your primary Cardiologist (physician) and Advanced Practice Providers (APPs -  Physician Assistants and Nurse Practitioners) who all work together to provide you with the care you need, when you need it.  Your next appointment:   6 month(s)  Provider:   Tonny Bollman, MD

## 2023-01-21 NOTE — Assessment & Plan Note (Signed)
Stable without dyspnea or edema, no evidence of volume overload on exam.  Continue current management.

## 2023-01-21 NOTE — Progress Notes (Signed)
Cardiology Office Note:    Date:  01/21/2023   ID:  [name], DOB 1941/01/01, MRN [id number]  PCP:  Georgina Quint, MD   [facility] Providers Cardiologist:  Tonny Bollman, MD     Referring MD: Georgina Quint, *   No chief complaint on file.   History of Present Illness:    [name] is a 82 y.o. male with a hx of coronary artery disease status post CABG 2019, chronic diastolic heart failure, and HTN, presenting follow-up evaluation.  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  A PET scan was ultimately done which demonstrated a hypermetabolic soft tissue thickening along the lateral wall of the left ventricle within the pericardial space. We have continued with observation and he remains asymptomatic.   The patient is here with his wife today.  He has been getting along pretty well.  He has chronic complaints of dizziness and leg weakness/generalized weakness.  This is unchanged over a long time.  He has not had any presyncope or frank syncope.  He denies chest pain, chest pressure, or shortness of breath.  No heart palpitations, edema, orthopnea, or PND.  Current Medications: Current Meds  Medication Sig   acetaminophen (TYLENOL) 325 MG tablet Take 325 mg by mouth daily.   amLODipine (NORVASC) 5 MG tablet Take 1 tablet (5 mg total) by mouth daily.   apixaban (ELIQUIS) 5 MG TABS tablet Take 1 tablet (5 mg total) by mouth 2 (two) times daily.   carvedilol (COREG) 12.5 MG tablet Take 1 tablet (12.5 mg total) by mouth 2 (two) times daily.   HYDROcodone-acetaminophen (NORCO) 5-325 MG tablet Take 1 tablet by mouth every 6 (six) hours as needed for moderate pain.   pantoprazole (PROTONIX) 40 MG tablet Take 1 tablet (40 mg total) by mouth daily.   rosuvastatin (CRESTOR) 40 MG tablet Take 1 tablet (40 mg total) by mouth daily.   valsartan  (DIOVAN) 160 MG tablet Take 1 tablet (160 mg total) by mouth daily.     Allergies:   Lipitor [atorvastatin], Morphine and codeine, and Pork-derived products   ROS:   Please see the history of present illness.    All other systems reviewed and are negative.  EKGs/Labs/Other Studies Reviewed:    The following studies were reviewed today: Cardiac Studies & Procedures   CARDIAC CATHETERIZATION  CARDIAC CATHETERIZATION 04/16/2017  Narrative  The left ventricular systolic function is normal.  LV end diastolic pressure is mildly elevated.  The left ventricular ejection fraction is 55-65% by visual estimate.  Prox RCA lesion is 50% stenosed.  Mid RCA lesion is 95% stenosed.  Dist RCA lesion is 50% stenosed with 90% stenosed side branch in Ost RPDA.  Dist LM to Ost LAD lesion is 90% stenosed.  Ost 1st Diag lesion is 100% stenosed.  Prox LAD lesion is 85% stenosed.  Mid LAD lesion is 80% stenosed.  Ost 1st Mrg to 1st Mrg lesion is 80% stenosed.  Ost Cx lesion is 90% stenosed.  Prox Cx lesion is 50% stenosed.  1.  Critical left main and multivessel coronary artery disease with severe proximal LAD stenosis, severe ostial left circumflex stenosis and severe in-stent restenosis, and severe mid RCA stenosis 2.  Normal LV systolic function with mildly elevated LVEDP  Recommendations: The patient will be transferred to a cardiac stepdown bed.  IV heparin will be restarted and he will continue on IV nitroglycerin.  A cardiac surgical consultation will be placed for urgent coronary bypass surgery.  The patient is chest pain-free at the completion of the procedure but has critical multivessel disease.  Findings Coronary Findings Diagnostic  Dominance: Right  Left Main Dist LM to Ost LAD lesion is 90% stenosed. There is a critical lesion at the distal left main with 90% stenosis present.  Left Anterior Descending Prox LAD lesion is 85% stenosed. The proximal LAD has severe diffuse  80-85% stenosis extending to the first diagonal origin. Mid LAD lesion is 80% stenosed. There is a tight napkin ring stenosis in the distal LAD as well as diffuse disease throughout that region.  First Diagonal Branch Ost 1st Diag lesion is 100% stenosed. The first diagonal branch is totally occluded.  The vessel can be seen filling late from left to left collaterals.  Left Circumflex Ost Cx lesion is 90% stenosed. There is severe ostial circumflex stenosis, likely part of the distal left main complex plaque. Prox Cx lesion is 50% stenosed. The lesion was previously treated using a bare metal stent over 2 years ago.  First Obtuse Marginal Branch Ost 1st Mrg to 1st Mrg lesion is 80% stenosed. The lesion was previously treated using a bare metal stent over 2 years ago. There is severe in-stent restenosis in the mid circumflex stent that extends into the first obtuse marginal branch.  Right Coronary Artery Prox RCA lesion is 50% stenosed. Mid RCA lesion is 95% stenosed. There is diffuse disease in the mid RCA extending into a critical hypodense 95% stenosis. Dist RCA lesion is 50% stenosed with 90% stenosed side branch in Ost RPDA.  Inferior Septal The PDA is diffusely diseased after a severe stenosis at its ostium  Intervention  No interventions have been documented.   STRESS TESTS  NM MYOCAR MULTI W/SPECT W 08/09/2020  Narrative CLINICAL DATA:  Chest pain. History of previous median sternotomy and CABG.  EXAM: MYOCARDIAL IMAGING WITH SPECT (REST AND PHARMACOLOGIC-STRESS)  GATED LEFT VENTRICULAR WALL MOTION STUDY  LEFT VENTRICULAR EJECTION FRACTION  TECHNIQUE: Standard myocardial SPECT imaging was performed after resting intravenous injection of 10 mCi Tc-39m tetrofosmin. Subsequently, intravenous infusion of Lexiscan was performed under the supervision of the Cardiology staff. At peak effect of the drug, 30 mCi Tc-17m tetrofosmin was injected intravenously and standard  myocardial SPECT imaging was performed. Quantitative gated imaging was also performed to evaluate left ventricular wall motion, and estimate left ventricular ejection fraction.  COMPARISON:  Chest radiograph-08/08/2020; CT the chest, abdomen and pelvis-08/08/2020  FINDINGS: Raw images: Mild breast and chest wall attenuation is seen on both provided rest and stress images. Mild GI attenuation is seen, worse on the provided stress images. Mild patient motion artifact is seen, worse on the provided rest images.  Perfusion: No decreased activity in the left ventricle on stress imaging to suggest reversible ischemia or infarction.  Wall Motion: Normal left ventricular wall motion. No left ventricular dilation.  Left Ventricular Ejection Fraction: 58 %  End diastolic volume 70 ml  End systolic volume 29 ml  IMPRESSION: 1. No scintigraphic evidence of prior infarction or pharmacologically induced ischemia.  2. Normal left ventricular wall motion.  3. Left ventricular ejection fraction 58%  4. Non invasive risk stratification*: Low  *2012 Appropriate Use Criteria for Coronary Revascularization Focused Update: J Am Coll Cardiol. 2012;59(9):857-881. http://content.dementiazones.com.aspx?articleid=1201161   Electronically Signed By: Simonne Come M.D. On: 08/09/2020 14:53   ECHOCARDIOGRAM  ECHOCARDIOGRAM COMPLETE 08/09/2021  Narrative ECHOCARDIOGRAM REPORT    Patient Name:  Edouard Brenes Date of Exam: 08/09/2021 Medical Rec #:  [id number]        Height:       66.0 in Accession #:    [id number]       Weight:       180.6 lb Date of Birth:  July 23, 1940        BSA:          1.915 m Patient Age:    81 years         BP:           114/70 mmHg Patient Gender: M                HR:           60 bpm. Exam Location:  [address]  Procedure: 2D Echo, Cardiac Doppler and Color Doppler  Indications:    I31.8 Pericardial mass; I25.10 Coronary artery disease  History:         Patient has prior history of Echocardiogram examinations, most recent 08/09/2020. CHF, Previous Myocardial Infarction, Prior CABG, Stroke, Signs/Symptoms:Syncope; Risk Factors:Hypertension, Diabetes and Dyslipidemia. Hypertensive heart disease. Aortic atherosclerosis. Pericardial effusion.  Sonographer:    Cathie Beams RCS Referring Phys: [phone] Sahiti Joswick  IMPRESSIONS   1. Left ventricular ejection fraction, by estimation, is 55 to 60%. The left ventricle has normal function. The left ventricle has no regional wall motion abnormalities. There is mild concentric left ventricular hypertrophy. Left ventricular diastolic parameters were normal. 2. Right ventricular systolic function is normal. The right ventricular size is normal. 3. Left atrial size was moderately dilated. 4. Known pericardial mass not well visualized on current study. 5. The mitral valve is grossly normal. Trivial mitral valve regurgitation. No evidence of mitral stenosis. 6. The aortic valve is grossly normal. There is mild calcification of the aortic valve. Aortic valve regurgitation is trivial. Aortic valve sclerosis is present, with no evidence of aortic valve stenosis. 7. The inferior vena cava is normal in size with greater than 50% respiratory variability, suggesting right atrial pressure of 3 mmHg.  Comparison(s): No significant change from prior study.  Conclusion(s)/Recommendation(s): Otherwise normal echocardiogram, with minor abnormalities described in the report.  FINDINGS Left Ventricle: Left ventricular ejection fraction, by estimation, is 55 to 60%. The left ventricle has normal function. The left ventricle has no regional wall motion abnormalities. The left ventricular internal cavity size was normal in size. There is mild concentric left ventricular hypertrophy. Left ventricular diastolic parameters were normal.  Right Ventricle: The right ventricular size is normal. No increase in right ventricular  wall thickness. Right ventricular systolic function is normal.  Left Atrium: Left atrial size was moderately dilated.  Right Atrium: Right atrial size was normal in size.  Pericardium: Known pericardial mass not well visualized on current study. There is no evidence of pericardial effusion.  Mitral Valve: The mitral valve is grossly normal. Mild mitral annular calcification. Trivial mitral valve regurgitation. No evidence of mitral valve stenosis.  Tricuspid Valve: The tricuspid valve is grossly normal. Tricuspid valve regurgitation is trivial. No evidence of tricuspid stenosis.  Aortic Valve: The aortic valve is grossly normal. There is mild calcification of the aortic valve. Aortic valve regurgitation is trivial. Aortic valve sclerosis is present, with no evidence of aortic valve stenosis.  Pulmonic Valve: The pulmonic valve was grossly normal. Pulmonic valve regurgitation is trivial. No evidence of pulmonic stenosis.  Aorta: The aortic root, ascending aorta and aortic arch are all structurally normal, with no evidence of dilitation or  obstruction.  Venous: The inferior vena cava is normal in size with greater than 50% respiratory variability, suggesting right atrial pressure of 3 mmHg.  IAS/Shunts: The atrial septum is grossly normal.   LEFT VENTRICLE PLAX 2D LVIDd:         3.30 cm   Diastology LVIDs:         2.30 cm   LV e' medial:    7.18 cm/s LV PW:         2.20 cm   LV E/e' medial:  12.7 LV IVS:        1.40 cm   LV e' lateral:   6.15 cm/s LVOT diam:     2.10 cm   LV E/e' lateral: 14.9 LV SV:         60 LV SV Index:   31 LVOT Area:     3.46 cm   RIGHT VENTRICLE RV Basal diam:  2.10 cm RV S prime:     12.00 cm/s TAPSE (M-mode): 1.6 cm  LEFT ATRIUM             Index        RIGHT ATRIUM           Index LA diam:        4.80 cm 2.51 cm/m   RA Area:     15.40 cm LA Vol (A2C):   63.6 ml 33.21 ml/m  RA Volume:   31.40 ml  16.40 ml/m LA Vol (A4C):   37.9 ml 19.79  ml/m LA Biplane Vol: 48.9 ml 25.54 ml/m AORTIC VALVE LVOT Vmax:   77.50 cm/s LVOT Vmean:  52.200 cm/s LVOT VTI:    0.172 m  AORTA Ao Root diam: 3.20 cm Ao Asc diam:  3.00 cm  MITRAL VALVE MV Area (PHT): 3.13 cm    SHUNTS MV Decel Time: 242 msec    Systemic VTI:  0.17 m MV E velocity: 91.50 cm/s  Systemic Diam: 2.10 cm MV A velocity: 90.70 cm/s MV E/A ratio:  1.01  Jodelle Red MD Electronically signed by Jodelle Red MD Signature Date/Time: 08/09/2021/4:39:52 PM    Final   TEE  ECHO TEE 04/17/2017  Interpretation Summary  Septum: No Patent Foramen Ovale present.  Left atrium: Patent foramen ovale not present.  Aortic valve: The valve is trileaflet. No stenosis. Mild regurgitation.  Mitral valve: Trace regurgitation.  Right ventricle: Normal cavity size and ejection fraction.  Tricuspid valve: Trace regurgitation.     CARDIAC MRI  MR CARDIAC MORPHOLOGY W WO CONTRAST 08/30/2020  Narrative CLINICAL DATA:  Clinical question of pericardial mass 82 year old male  EXAM: CARDIAC MRI  TECHNIQUE: The patient was scanned on a 1.5 Tesla GE magnet. A dedicated cardiac coil was used. Functional imaging was done using Fiesta sequences. 2,3, and 4 chamber views were done to assess for RWMA's. Modified Simpson's rule using a short axis stack was used to calculate an ejection fraction on a dedicated work [employer]. The patient received 10 cc of Gadavist. After 10 minutes inversion recovery sequences were used to assess for infiltration and scar tissue.  CONTRAST:  10 cc  of Gadavist  FINDINGS: 1. Normal left ventricular size, with LVEDD 41 mm, and LVEDVi 61 mL/m2.  Normal left ventricular thickness, with intraventricular septal thickness of 9 mm, posterior wall thickness of 10 mm.  Mild left ventricular systolic dysfunction (LVEF =49%). There are no regional wall motion abnormalities; mild global hypokinesis.  2. Normal  right ventricular size with  RVEDVI 46 mL/m2.  Normal right ventricular thickness.  Normal right ventricular systolic function (RVEF =56%). There are no regional wall motion abnormalities or aneurysms.  3.  Normal right atrial size and moderate left atrial dilation.  4. Normal size of the aortic root, ascending aorta and pulmonary artery.  5.  No significant valvular abnormalities.  6. There is a moderate (max dimension 17 mm) heterogeneous effusion overlying the lateral wall of the left ventricle (most prominent over the anterolateral base. There is intermediate but largely iso-intense native T1 signal intensity (1159 ms max average 1070 ms) with an intermediate to iso-intense T2 signal (65 ms at max, average 50 ms). There is evidence of contrast enhancement and pericardial thickening. No evidence of fat deposition based on dark blood assessment. These is an area of lack of enhancement best seen in the PSIR view.  7. There is evidence of sternotomy wires and both breath hold and wrap around artifact. Grossly, no other extracardiac findings. Recommended dedicated study if concerned for non-cardiac pathology.  IMPRESSION: Complex pericardial confluence as described above. Reaching out to primary MD.  Riley Lam MD   Electronically Signed By: Riley Lam MD On: 08/31/2020 08:19         EKG:        Recent Labs: 02/27/2022: TSH 2.110 11/15/2022: ALT 11; BUN 11; Creatinine, Ser 0.91; Hemoglobin 12.5; Platelets 248; Potassium 3.2; Sodium 138  Recent Lipid Panel    Component Value Date/Time   CHOL 111 03/07/2021 1145   CHOL 105 08/14/2019 0956   TRIG 102.0 03/07/2021 1145   HDL 39.10 03/07/2021 1145   HDL 37 (L) 08/14/2019 0956   CHOLHDL 3 03/07/2021 1145   VLDL 20.4 03/07/2021 1145   LDLCALC 51 03/07/2021 1145   LDLCALC 50 08/14/2019 0956   LDLDIRECT 150.8 06/30/2008 1007     Risk Assessment/Calculations:                Physical Exam:     VS:  BP 130/80   Pulse 64   Ht 5\' 6"  (1.676 m)   Wt 175 lb 3.2 oz (79.5 kg)   SpO2 97%   BMI 28.28 kg/m     Wt Readings from Last 3 Encounters:  01/21/23 175 lb 3.2 oz (79.5 kg)  11/15/22 173 lb 8 oz (78.7 kg)  09/24/22 173 lb 6.4 oz (78.7 kg)     GEN:  Well nourished, well developed in no acute distress HEENT: Normal NECK: No JVD; No carotid bruits LYMPHATICS: No lymphadenopathy CARDIAC: RRR, 2/6 SEM at the RUSB RESPIRATORY:  Clear to auscultation without rales, wheezing or rhonchi  ABDOMEN: Soft, non-tender, non-distended MUSCULOSKELETAL:  No edema; No deformity  SKIN: Warm and dry NEUROLOGIC:  Alert and oriented x 3 PSYCHIATRIC:  Normal affect   Assessment & Plan Pericardial mass This has been stable on serial imaging studies.  Her been no compressive symptoms.  Continue observation. Essential hypertension Blood pressure well-controlled on amlodipine, carvedilol, and valsartan.  Last labs demonstrated a creatinine of 0.91. Mixed hyperlipidemia Treated with rosuvastatin.  LDL cholesterol 51. Chronic diastolic heart failure (HCC) Stable without dyspnea or edema, no evidence of volume overload on exam.  Continue current management. Coronary artery disease involving native coronary artery of native heart without angina pectoris No anginal symptoms on amlodipine.  No antiplatelet therapy because of apixaban which he takes for history of PE.  Overall the patient appears to be clinically stable from a cardiac perspective.  His benign outflow murmur is related  to aortic sclerosis.  I will plan to see him back in 6 months for follow-up evaluation.  If he has not had any labs drawn by his primary care physician I will probably update his labs at the time of his next office visit.    Medication Adjustments/Labs and Tests Ordered: Current medicines are reviewed at length with the patient today.  Concerns regarding medicines are outlined above.  No orders of the defined types were  placed in this encounter.  No orders of the defined types were placed in this encounter.   There are no Patient Instructions on file for this visit.   Signed, Tonny Bollman, MD  01/21/2023 10:40 AM    [facility]

## 2023-09-30 ENCOUNTER — Ambulatory Visit

## 2023-09-30 ENCOUNTER — Ambulatory Visit: Admitting: Emergency Medicine

## 2023-11-02 ENCOUNTER — Other Ambulatory Visit: Payer: Self-pay

## 2023-11-02 ENCOUNTER — Emergency Department (HOSPITAL_COMMUNITY)

## 2023-11-02 ENCOUNTER — Encounter (HOSPITAL_COMMUNITY): Payer: Self-pay

## 2023-11-02 ENCOUNTER — Inpatient Hospital Stay (HOSPITAL_COMMUNITY)
Admission: EM | Admit: 2023-11-02 | Discharge: 2023-11-04 | DRG: 064 | Disposition: A | Discharge status: Home / Self Care | Attending: Internal Medicine | Admitting: Internal Medicine

## 2023-11-02 DIAGNOSIS — Z888 Allergy status to other drugs, medicaments and biological substances status: Secondary | ICD-10-CM | POA: Diagnosis not present

## 2023-11-02 DIAGNOSIS — I700 Atherosclerosis of aorta: Secondary | ICD-10-CM | POA: Diagnosis not present

## 2023-11-02 DIAGNOSIS — I2510 Atherosclerotic heart disease of native coronary artery without angina pectoris: Secondary | ICD-10-CM | POA: Diagnosis present

## 2023-11-02 DIAGNOSIS — R29810 Facial weakness: Secondary | ICD-10-CM | POA: Diagnosis present

## 2023-11-02 DIAGNOSIS — Z955 Presence of coronary angioplasty implant and graft: Secondary | ICD-10-CM | POA: Diagnosis not present

## 2023-11-02 DIAGNOSIS — Z91014 Allergy to mammalian meats: Secondary | ICD-10-CM

## 2023-11-02 DIAGNOSIS — Z79899 Other long term (current) drug therapy: Secondary | ICD-10-CM

## 2023-11-02 DIAGNOSIS — I639 Cerebral infarction, unspecified: Secondary | ICD-10-CM | POA: Diagnosis not present

## 2023-11-02 DIAGNOSIS — J9811 Atelectasis: Secondary | ICD-10-CM | POA: Diagnosis not present

## 2023-11-02 DIAGNOSIS — I618 Other nontraumatic intracerebral hemorrhage: Secondary | ICD-10-CM | POA: Diagnosis present

## 2023-11-02 DIAGNOSIS — I491 Atrial premature depolarization: Secondary | ICD-10-CM | POA: Diagnosis not present

## 2023-11-02 DIAGNOSIS — I6381 Other cerebral infarction due to occlusion or stenosis of small artery: Principal | ICD-10-CM | POA: Diagnosis present

## 2023-11-02 DIAGNOSIS — Z8673 Personal history of transient ischemic attack (TIA), and cerebral infarction without residual deficits: Secondary | ICD-10-CM

## 2023-11-02 DIAGNOSIS — Z7901 Long term (current) use of anticoagulants: Secondary | ICD-10-CM | POA: Diagnosis not present

## 2023-11-02 DIAGNOSIS — Z8249 Family history of ischemic heart disease and other diseases of the circulatory system: Secondary | ICD-10-CM

## 2023-11-02 DIAGNOSIS — Z7982 Long term (current) use of aspirin: Secondary | ICD-10-CM | POA: Diagnosis not present

## 2023-11-02 DIAGNOSIS — I6389 Other cerebral infarction: Secondary | ICD-10-CM | POA: Diagnosis not present

## 2023-11-02 DIAGNOSIS — Z885 Allergy status to narcotic agent status: Secondary | ICD-10-CM

## 2023-11-02 DIAGNOSIS — Z91148 Patient's other noncompliance with medication regimen for other reason: Secondary | ICD-10-CM | POA: Diagnosis not present

## 2023-11-02 DIAGNOSIS — I110 Hypertensive heart disease with heart failure: Secondary | ICD-10-CM | POA: Diagnosis present

## 2023-11-02 DIAGNOSIS — I252 Old myocardial infarction: Secondary | ICD-10-CM

## 2023-11-02 DIAGNOSIS — Z951 Presence of aortocoronary bypass graft: Secondary | ICD-10-CM | POA: Diagnosis not present

## 2023-11-02 DIAGNOSIS — R471 Dysarthria and anarthria: Secondary | ICD-10-CM | POA: Diagnosis present

## 2023-11-02 DIAGNOSIS — R0989 Other specified symptoms and signs involving the circulatory and respiratory systems: Secondary | ICD-10-CM | POA: Diagnosis not present

## 2023-11-02 DIAGNOSIS — I2584 Coronary atherosclerosis due to calcified coronary lesion: Secondary | ICD-10-CM | POA: Diagnosis not present

## 2023-11-02 DIAGNOSIS — Z7902 Long term (current) use of antithrombotics/antiplatelets: Secondary | ICD-10-CM | POA: Diagnosis not present

## 2023-11-02 DIAGNOSIS — E785 Hyperlipidemia, unspecified: Secondary | ICD-10-CM | POA: Diagnosis present

## 2023-11-02 DIAGNOSIS — I5032 Chronic diastolic (congestive) heart failure: Secondary | ICD-10-CM | POA: Diagnosis present

## 2023-11-02 DIAGNOSIS — R079 Chest pain, unspecified: Secondary | ICD-10-CM | POA: Diagnosis present

## 2023-11-02 DIAGNOSIS — R569 Unspecified convulsions: Secondary | ICD-10-CM | POA: Diagnosis not present

## 2023-11-02 DIAGNOSIS — M7989 Other specified soft tissue disorders: Secondary | ICD-10-CM | POA: Diagnosis not present

## 2023-11-02 DIAGNOSIS — I69320 Aphasia following cerebral infarction: Secondary | ICD-10-CM | POA: Diagnosis not present

## 2023-11-02 DIAGNOSIS — R55 Syncope and collapse: Secondary | ICD-10-CM | POA: Diagnosis present

## 2023-11-02 DIAGNOSIS — I10 Essential (primary) hypertension: Secondary | ICD-10-CM | POA: Diagnosis not present

## 2023-11-02 DIAGNOSIS — R29702 NIHSS score 2: Secondary | ICD-10-CM | POA: Diagnosis not present

## 2023-11-02 DIAGNOSIS — I739 Peripheral vascular disease, unspecified: Secondary | ICD-10-CM | POA: Diagnosis not present

## 2023-11-02 DIAGNOSIS — R29701 NIHSS score 1: Secondary | ICD-10-CM | POA: Diagnosis present

## 2023-11-02 DIAGNOSIS — I499 Cardiac arrhythmia, unspecified: Secondary | ICD-10-CM | POA: Diagnosis not present

## 2023-11-02 DIAGNOSIS — R29818 Other symptoms and signs involving the nervous system: Secondary | ICD-10-CM | POA: Diagnosis not present

## 2023-11-02 DIAGNOSIS — R4701 Aphasia: Secondary | ICD-10-CM | POA: Diagnosis not present

## 2023-11-02 DIAGNOSIS — R404 Transient alteration of awareness: Secondary | ICD-10-CM | POA: Diagnosis not present

## 2023-11-02 DIAGNOSIS — I771 Stricture of artery: Secondary | ICD-10-CM | POA: Diagnosis not present

## 2023-11-02 LAB — I-STAT CHEM 8, ED
BUN: 9 mg/dL (ref 8–23)
Calcium, Ion: 1.14 mmol/L — ABNORMAL LOW (ref 1.15–1.40)
Chloride: 102 mmol/L (ref 98–111)
Creatinine, Ser: 1.1 mg/dL (ref 0.61–1.24)
Glucose, Bld: 130 mg/dL — ABNORMAL HIGH (ref 70–99)
HCT: 40 % (ref 39.0–52.0)
Hemoglobin: 13.6 g/dL (ref 13.0–17.0)
Potassium: 3.7 mmol/L (ref 3.5–5.1)
Sodium: 139 mmol/L (ref 135–145)
TCO2: 25 mmol/L (ref 22–32)

## 2023-11-02 LAB — URINALYSIS, ROUTINE W REFLEX MICROSCOPIC
Bilirubin Urine: NEGATIVE
Glucose, UA: NEGATIVE mg/dL
Hgb urine dipstick: NEGATIVE
Ketones, ur: NEGATIVE mg/dL
Leukocytes,Ua: NEGATIVE
Nitrite: NEGATIVE
Protein, ur: NEGATIVE mg/dL
Specific Gravity, Urine: >1.046 — ABNORMAL HIGH (ref 1.005–1.030)
pH: 9 — ABNORMAL HIGH (ref 5.0–8.0)

## 2023-11-02 LAB — DIFFERENTIAL
Abs Immature Granulocytes: 0.04 K/uL (ref 0.00–0.07)
Basophils Absolute: 0.1 K/uL (ref 0.0–0.1)
Basophils Relative: 1 %
Eosinophils Absolute: 0.4 K/uL (ref 0.0–0.5)
Eosinophils Relative: 4 %
Immature Granulocytes: 0 %
Lymphocytes Relative: 22 %
Lymphs Abs: 1.9 K/uL (ref 0.7–4.0)
Monocytes Absolute: 0.4 K/uL (ref 0.1–1.0)
Monocytes Relative: 5 %
Neutro Abs: 6.1 K/uL (ref 1.7–7.7)
Neutrophils Relative %: 68 %

## 2023-11-02 LAB — COMPREHENSIVE METABOLIC PANEL WITH GFR
ALT: 10 U/L (ref 0–44)
AST: 13 U/L — ABNORMAL LOW (ref 15–41)
Albumin: 3.2 g/dL — ABNORMAL LOW (ref 3.5–5.0)
Alkaline Phosphatase: 92 U/L (ref 38–126)
Anion gap: 10 (ref 5–15)
BUN: 9 mg/dL (ref 8–23)
CO2: 24 mmol/L (ref 22–32)
Calcium: 8.9 mg/dL (ref 8.9–10.3)
Chloride: 102 mmol/L (ref 98–111)
Creatinine, Ser: 1.02 mg/dL (ref 0.61–1.24)
GFR, Estimated: >60 mL/min (ref >60)
Glucose, Bld: 131 mg/dL — ABNORMAL HIGH (ref 70–99)
Potassium: 3.7 mmol/L (ref 3.5–5.1)
Sodium: 136 mmol/L (ref 135–145)
Total Bilirubin: 0.8 mg/dL (ref 0.0–1.2)
Total Protein: 6.9 g/dL (ref 6.5–8.1)

## 2023-11-02 LAB — CBC
HCT: 41.8 % (ref 39.0–52.0)
Hemoglobin: 13.6 g/dL (ref 13.0–17.0)
MCH: 28.6 pg (ref 26.0–34.0)
MCHC: 32.5 g/dL (ref 30.0–36.0)
MCV: 88 fL (ref 80.0–100.0)
Platelets: 234 K/uL (ref 150–400)
RBC: 4.75 MIL/uL (ref 4.22–5.81)
RDW: 14 % (ref 11.5–15.5)
WBC: 9 K/uL (ref 4.0–10.5)
nRBC: 0 % (ref 0.0–0.2)

## 2023-11-02 LAB — RAPID URINE DRUG SCREEN, HOSP PERFORMED
Amphetamines: NOT DETECTED
Barbiturates: NOT DETECTED
Benzodiazepines: NOT DETECTED
Cocaine: NOT DETECTED
Opiates: NOT DETECTED
Tetrahydrocannabinol: NOT DETECTED

## 2023-11-02 LAB — PROTIME-INR
INR: 1.1 (ref 0.8–1.2)
Prothrombin Time: 14.4 s (ref 11.4–15.2)

## 2023-11-02 LAB — TROPONIN I (HIGH SENSITIVITY)
Troponin I (High Sensitivity): 6 ng/L (ref <18)
Troponin I (High Sensitivity): 6 ng/L (ref <18)

## 2023-11-02 LAB — APTT: aPTT: 27 s (ref 24–36)

## 2023-11-02 LAB — ETHANOL: Alcohol, Ethyl (B): <15 mg/dL (ref <15)

## 2023-11-02 MED ORDER — STROKE: EARLY STAGES OF RECOVERY BOOK: Freq: Once | Status: DC

## 2023-11-02 MED ORDER — LORAZEPAM 2 MG/ML IJ SOLN
1.0000 mg | Freq: Once | INTRAMUSCULAR | Status: AC
  Administered 2023-11-02: 1 mg via INTRAVENOUS
  Filled 2023-11-02: qty 1

## 2023-11-02 MED ORDER — ACETAMINOPHEN 160 MG/5ML PO SOLN: 650.0000 mg | Freq: Four times a day (QID) | ORAL | Status: DC | PRN

## 2023-11-02 MED ORDER — ACETAMINOPHEN 650 MG RE SUPP: 650.0000 mg | Freq: Four times a day (QID) | RECTAL | Status: DC | PRN

## 2023-11-02 MED ORDER — ACETAMINOPHEN 325 MG PO TABS: 650.0000 mg | ORAL_TABLET | Freq: Four times a day (QID) | ORAL | Status: DC | PRN

## 2023-11-02 MED ORDER — ACETAMINOPHEN 325 MG PO TABS
650.0000 mg | ORAL_TABLET | Freq: Once | ORAL | Status: AC
  Administered 2023-11-02: 650 mg via ORAL
  Filled 2023-11-02: qty 2

## 2023-11-02 MED ORDER — IOHEXOL 350 MG/ML SOLN
75.0000 mL | Freq: Once | INTRAVENOUS | Status: AC | PRN
  Administered 2023-11-02: 75 mL via INTRAVENOUS

## 2023-11-02 NOTE — Progress Notes (Incomplete)
 STROKE TEAM PROGRESS NOTE    SIGNIFICANT HOSPITAL EVENTS  9/13: presented post-syncope with dysarthria and chest pain.  MRI: right putamen nucleus acute infarct, multiple chronic infarcts and micro hemorrhages suggesting vasculitis  INTERIM HISTORY/SUBJECTIVE    OBJECTIVE  CBC    Component Value Date/Time   WBC 9.0 11/02/2023 1243   RBC 4.75 11/02/2023 1243   HGB 13.6 11/02/2023 1248   HGB 14.8 02/27/2022 0954   HCT 40.0 11/02/2023 1248   HCT 44.7 02/27/2022 0954   PLT 234 11/02/2023 1243   PLT 244 02/27/2022 0954   MCV 88.0 11/02/2023 1243   MCV 88 02/27/2022 0954   MCH 28.6 11/02/2023 1243   MCHC 32.5 11/02/2023 1243   RDW 14.0 11/02/2023 1243   RDW 14.8 02/27/2022 0954   LYMPHSABS 1.9 11/02/2023 1243   LYMPHSABS 2.5 05/25/2020 1607   MONOABS 0.4 11/02/2023 1243   EOSABS 0.4 11/02/2023 1243   EOSABS 0.5 (H) 05/25/2020 1607   BASOSABS 0.1 11/02/2023 1243   BASOSABS 0.1 05/25/2020 1607    BMET    Component Value Date/Time   NA 139 11/02/2023 1248   NA 143 02/27/2022 0954   K 3.7 11/02/2023 1248   CL 102 11/02/2023 1248   CO2 24 11/02/2023 1243   GLUCOSE 130 (H) 11/02/2023 1248   BUN 9 11/02/2023 1248   BUN 15 02/27/2022 0954   CREATININE 1.10 11/02/2023 1248   CREATININE 0.95 01/20/2016 1047   CALCIUM  8.9 11/02/2023 1243   EGFR 88 02/27/2022 0954   GFRNONAA >60 11/02/2023 1243    IMAGING past 24 hours MR BRAIN WO CONTRAST Result Date: 11/02/2023 EXAM: MRI BRAIN WITHOUT CONTRAST 11/02/2023 02:24:40 PM TECHNIQUE: Multiplanar multisequence MRI of the head/brain was performed without the administration of intravenous contrast. COMPARISON: CT head and CTA head and neck 11/02/2023 CLINICAL HISTORY: Neuro deficit, acute, stroke suspected. FINDINGS: BRAIN AND VENTRICLES: An acute 10 mm nonhemorrhagic infarct is present in the lateral right thalamus. T2 and FLAIR signal is associated. Remote lacunar infarcts are present within the thalami bilaterally. Remote lacunar  infarcts are present in the right paramedian pons and within the left cerebellum. Confluent periventricular and subcortical white matter T2 signal changes are advanced for age. Multiple punctate foci of susceptibility are present within the thalami, cerebellum, and brainstem and to a lesser extent scattered in the subcortical white matter over the convexities. Prominent vascular spaces are present in the basal ganglia. ORBITS: No acute abnormality. SINUSES AND MASTOIDS: Polyps are present within the nasal cavity. Mild mucosal thickening is present in the left greater than right maxillary sinus. Scattered mucosal thickening is present throughout the ethmoid air cells and sphenoid sinuses. The right frontal sinus is opacified. BONES AND SOFT TISSUES: Normal marrow signal. No acute soft tissue abnormality. IMPRESSION: 1. Acute 10 mm nonhemorrhagic infarct in the lateral right putamen nucleus with associated T2 and FLAIR signal. 2. Advanced confluent periventricular and subcortical white matter T2 signal changes for age. 3. Multiple punctate foci of susceptibility within the thalami, cerebellum, brainstem, and scattered in the subcortical white matter over the convexities. This is consistent with microhemorrhages and suggests underlying vasculitis 4. Remote lacunar infarcts in the thalami bilaterally, right paramedian pons, and left cerebellum. The pertinent results were texted to Dr. Vanessa via the [facility] system at 3:32 pm. Electronically signed by: Lonni Necessary MD 11/02/2023 03:35 PM EDT RP Workstation: [id number]   CT ANGIO HEAD NECK W WO CM (CODE STROKE) Result Date: 11/02/2023 CLINICAL DATA:  83 year old male code stroke presentation, aphasia.  EXAM: CT ANGIOGRAPHY HEAD AND NECK TECHNIQUE: Multidetector CT imaging of the head and neck was performed using the standard protocol during bolus administration of intravenous contrast. Multiplanar CT image reconstructions and MIPs were obtained to evaluate the  vascular anatomy. Carotid stenosis measurements (when applicable) are obtained utilizing NASCET criteria, using the distal internal carotid diameter as the denominator. RADIATION DOSE REDUCTION: This exam was performed according to the departmental dose-optimization program which includes automated exposure control, adjustment of the mA and/or kV according to patient size and/or use of iterative reconstruction technique. CONTRAST:  75mL OMNIPAQUE  IOHEXOL  350 MG/ML SOLN COMPARISON:  Head CT 1250 hours today. CTA head and neck 05/02/2016. FINDINGS: CTA NECK Skeleton: Sternotomy. Ordinary cervical spine degeneration. No acute osseous abnormality identified. Upper chest: Sternotomy may be new since 2018. Evidence of CABG. Visible central pulmonary arteries appear patent, enhancing. Mild upper lung atelectasis. Other neck: Nonvascular neck soft tissue spaces appear stable since 2018. Small chronic retention cyst of the right epiglottic fold suspected (series 7, image 106), stable. No acute or suspicious finding identified. Aortic arch: Calcified aortic atherosclerosis.  3 vessel arch. Right carotid system: Brachiocephalic artery tortuosity and atherosclerosis without stenosis. Mildly tortuous proximal right CCA. Patent right carotid bifurcation with mild plaque. No significant stenosis to the skull base. Left carotid system: Left CCA origin plaque without stenosis. Tortuous left CCA. Mild plaque from the left carotid bifurcation through the left ICA bulb without stenosis. Vertebral arteries: Soft plaque proximal right subclavian artery is mild without stenosis. Mild calcified plaque at the right vertebral artery origin without stenosis. Non dominant right vertebral artery has a stable caliber since 2018, remains diminutive but patent to the skull base. No stenosis in the neck. Proximal left subclavian artery soft calcified plaque without stenosis. Mild tortuosity. Calcified plaque at the left vertebral origin. No  significant stenosis. Dominant left vertebral artery, tortuous V1 segment. No other left vertebral plaque or stenosis to the skull base. CTA HEAD Posterior circulation: Dominant left vertebral artery with normal PICA origins primarily supplies the basilar without plaque or stenosis. Right V4 segment is diminutive but remains patent to the vertebrobasilar junction. Right AICA appears to be dominant. Patent basilar artery with mild irregularity. No basilar stenosis. Patent SCA and PCA origins. Posterior communicating arteries are diminutive or absent. Stable basilar tip since 2018. Chronic bilateral PCA atherosclerosis and irregularity. Bilateral PCA branches remain patent. Up to moderate left P2 irregularity, mild-to-moderate stenosis does appear increased since 2018 on series 605, image 45. Contralateral mild right P2 irregularity appears stable. Anterior circulation: Both ICA siphons are patent. Bulky left siphon cavernous and proximal supraclinoid segment calcified plaque. And moderate distal cavernous and anterior genu left ICA stenosis appears increased since 2018. Contralateral right siphon moderate calcified plaque with mild cavernous but up to moderate supraclinoid right ICA stenosis (series 5, image 104) appears stable since 2018. Carotid termini are patent. MCA and ACA origins are patent. Dominant left A1 again noted. Normal anterior communicating artery. Bilateral ACA branches are patent. But moderate left ACA A2 irregularity and stenosis has increased since 2018 on series 9, image 39. Left MCA M1 segment and bifurcation are patent without stenosis. Right MCA M1 segment and trifurcation are patent with mild irregularity, no stenosis. Bilateral MCA branches are stable. No significant branch stenosis identified. Venous sinuses: Early contrast timing, not evaluated. Anatomic variants: Dominant left vertebral artery, right is diminutive. Dominant left ACA A1. Review of the MIP images confirms the above  findings IMPRESSION: 1. Negative for large vessel occlusion.  2. Positive for abundant atherosclerosis in the head and neck. Notable and/or increased since 2018 CTA stenoses: - Moderate LICA siphon stenosis appears progressed since 2018. Moderate Right siphon stenosis appears stable. - Left ACA A2 segment Moderate stenosis is increased. - Moderate Left PCA P2 stenosis appears increased 3. No significant extracranial stenosis. 4.  Aortic Atherosclerosis (ICD10-I70.0). Electronically Signed   By: VEAR Hurst M.D.   On: 11/02/2023 13:16   CT HEAD CODE STROKE WO CONTRAST Result Date: 11/02/2023 CLINICAL DATA:  Code stroke.  83 year old male.  Aphasia. EXAM: CT HEAD WITHOUT CONTRAST TECHNIQUE: Contiguous axial images were obtained from the base of the skull through the vertex without intravenous contrast. RADIATION DOSE REDUCTION: This exam was performed according to the departmental dose-optimization program which includes automated exposure control, adjustment of the mA and/or kV according to patient size and/or use of iterative reconstruction technique. COMPARISON:  Brain MRI 11/04/2017.  Head CT 08/08/2020. FINDINGS: Brain: Stable cerebral volume. No ventriculomegaly. No midline shift, mass effect, or evidence of intracranial mass lesion. No acute intracranial hemorrhage identified. Advanced chronic small vessel disease. Confluent, widespread bilateral cerebral white matter hypodensity and extensive heterogeneity in the bilateral deep gray nuclei. No significant change from the 2022 CT. Posterior fossa appears relatively spared. No cortically based acute infarct identified. Vascular: Calcified atherosclerosis at the skull base. No suspicious intracranial vascular hyperdensity. Skull: Stable and intact. Sinuses/Orbits: Chronic sinonasal polyposis, stable since 2022. Areas of associated chronic paranasal sinus periosteal thickening. Tympanic cavities and mastoids remain well aerated. Other: No gaze deviation. Visualized  orbits and scalp soft tissues are within normal limits. ASPECTS [facility] Stroke Program Early CT Score) Total score (0-10 with 10 being normal): 10 IMPRESSION: 1. No acute cortically based infarct or acute intracranial hemorrhage identified. ASPECTS 10. 2. Advanced chronic small vessel disease appears stable by CT since 2022. 3. Chronic sinonasal polyposis. #1 and #2 communicated to Dr. Khaliqdina at 12:59 pm on 11/02/2023 by text page via the [facility] messaging system. Electronically Signed   By: VEAR Hurst M.D.   On: 11/02/2023 13:00    Vitals:   11/02/23 1530 11/02/23 1600 11/02/23 1630 11/02/23 1725  BP: (!) 165/98 108/82 (!) 146/97   Pulse: (!) 57 (!) 59 (!) 57   Resp: 17 18 17    Temp:    98.8 F (37.1 C)  TempSrc:    Oral  SpO2: 100% 100% 99%   Weight:      Height:         PHYSICAL EXAM General:  Alert, well-nourished, well-developed patient in no acute distress Psych:  Mood and affect appropriate for situation CV: Regular rate and rhythm on monitor Respiratory:  Regular, unlabored respirations on room air GI: Abdomen soft and nontender   NEURO:  Mental Status: AA&Ox3, patient is able to give clear and coherent history Speech/Language: speech is without dysarthria or aphasia.  Naming, repetition, fluency, and comprehension intact.  Cranial Nerves:  II: PERRL. Visual fields full.  III, IV, VI: EOMI. Eyelids elevate symmetrically.  V: Sensation is intact to light touch and symmetrical to face.  VII: Face is symmetrical resting and smiling VIII: hearing intact to voice. IX, X: Palate elevates symmetrically. Phonation is normal.  KP:Dynloizm shrug 5/5. XII: tongue is midline without fasciculations. Motor: 5/5 strength to all muscle groups tested.  Tone: is normal and bulk is normal Sensation- Intact to light touch bilaterally. Extinction absent to light touch to DSS.   Coordination: FTN intact bilaterally, HKS: no ataxia in BLE.No drift.  Gait- deferred  Most Recent NIH ***      ASSESSMENT/PLAN  [name] is a 83 y.o. male with history of CAD with CABGx4, PE previously on eliquis  who presented post-syncope with dysarthria and chest pain. Imaging revealed right putamen nucleus acute infarct, multiple chronic infarcts and micro hemorrhages suggesting vasculitis, admitted for full stroke workup.  NIH on Admission 1.  Acute Ischemic Infarct:  right putamen nucleus  Etiology:  ***  Code Stroke CT head  No acute abnormality. Small vessel disease. ASPECTS 10.    CTA head & neck  Negative for large vessel occlusion  Moderate LICA siphon stenosis appears progressed since 2018. Moderate Right siphon stenosis appears stable. Left ACA A2 segment Moderate stenosis is increased. Moderate Left PCA P2 stenosis appears increased MRI   Acute 10 mm nonhemorrhagic infarct in the lateral right putamen nucleus with associated T2 and FLAIR signal. Advanced confluent periventricular and subcortical white matter T2 signal changes for age. Multiple punctate foci of susceptibility within the thalami, cerebellum, brainstem, and scattered in the subcortical white matter over the convexities. This is consistent with microhemorrhages and suggests underlying vasculitis Remote lacunar infarcts in the thalami bilaterally, right paramedian pons, and left cerebellum. 2D Echo *** LDL 51 HgbA1c 6.0 VTE prophylaxis - *** Eliquis  (apixaban ) daily prior to admission, now on {anticoagulants:31417} for *** weeks and then *** alone. Therapy recommendations:  {Therapy Recommendations at Discharge:29703} Disposition:  ***  Hx of Stroke/TIA ***  Atrial fibrillation Home Meds:  Continue telemetry monitoring Begin anticoagulation with ***   Hypertension S/p CABG x4 Home meds:  Coreg  12.5mg  BID, Norvasc  5mg  daily, Valsartan  160mg  daily CABG x4 2019 Uns***Stable {Blood Pressure Goal:29586}   Hyperlipidemia Home meds:  Crestor  40mg , resumed in hospital LDL 51, goal < 70 Continue  statin at discharge  Diabetes type II, Prediabetic Home meds:  none HgbA1c 6.0, goal < 7.0 CBGs SSI Recommend close follow-up with PCP for better DM control  Dysphagia Patient has post-stroke dysphagia, SLP consulted    Diet   Diet NPO time specified   Advance diet as tolerated  Other Stroke Risk Factors Coronary artery disease   Other Active Problems   Hospital day # 0    To contact Stroke Continuity provider, please refer to [email or web address]. After hours, contact General Neurology

## 2023-11-02 NOTE — ED Notes (Signed)
 Patient transported to MRI

## 2023-11-02 NOTE — ED Provider Notes (Signed)
 [place] EMERGENCY DEPARTMENT AT [facility] Provider Note   CSN: [id number] Arrival date & time: 11/02/23  1242     Patient presents with: No chief complaint on file.   [name] is a 83 y.o. male.  Spanish-speaking male brought in by EMS as a possible code stroke.  He was apparently at church when he had a syncopal event and was incontinent of urine.  Reportedly had difficulty speaking and swallowing.  Brought emergently to the emergency department and evaluated by neurology and taken to CT. he is only complaint to me right now is that his throat hurts.   The history is provided by the patient, the EMS personnel and a relative.  Loss of Consciousness Episode history:  Single Most recent episode:  Today Progression:  Resolved Chronicity:  New Associated symptoms: no chest pain, no fever, no headaches, no nausea, no shortness of breath and no vomiting        Prior to Admission medications   Medication Sig Start Date End Date Taking? Authorizing Provider  acetaminophen  (TYLENOL ) 325 MG tablet Take 325 mg by mouth daily.    [provider]  amLODipine  (NORVASC ) 5 MG tablet Take 1 tablet (5 mg total) by mouth daily. 08/08/22   [name], MD  apixaban  (ELIQUIS ) 5 MG TABS tablet Take 1 tablet (5 mg total) by mouth 2 (two) times daily. 01/30/22   [name], MD  carvedilol  (COREG ) 12.5 MG tablet Take 1 tablet (12.5 mg total) by mouth 2 (two) times daily. 08/08/22   [name], MD  HYDROcodone -acetaminophen  (NORCO) 5-325 MG tablet Take 1 tablet by mouth every 6 (six) hours as needed for moderate pain. 07/30/22   Purcell [name], MD  pantoprazole  (PROTONIX ) 40 MG tablet Take 1 tablet (40 mg total) by mouth daily. 03/07/21   Purcell [name], MD  rosuvastatin  (CRESTOR ) 40 MG tablet Take 1 tablet (40 mg total) by mouth daily. 08/08/22   [name], MD  valsartan  (DIOVAN ) 160 MG tablet Take 1 tablet (160 mg total) by mouth daily. 08/08/22    [name], MD    Allergies: Lipitor [atorvastatin ], Morphine  and codeine, and Pork-derived products    Review of Systems  Constitutional:  Negative for fever.  HENT:  Positive for sore throat (??).   Respiratory:  Negative for shortness of breath.   Cardiovascular:  Positive for syncope. Negative for chest pain.  Gastrointestinal:  Negative for nausea and vomiting.  Neurological:  Positive for facial asymmetry (??) and speech difficulty (??). Negative for headaches.    Updated Vital Signs BP 108/82   Pulse (!) 59   Temp (!) 97.2 F (36.2 C) (Oral)   Resp 18   Ht 5' 6 (1.676 m)   Wt 72.6 kg   SpO2 100%   BMI 25.82 kg/m   Physical Exam Vitals and nursing note reviewed.  Constitutional:      Appearance: Normal appearance. He is well-developed.  HENT:     Head: Normocephalic and atraumatic.  Eyes:     Conjunctiva/sclera: Conjunctivae normal.  Cardiovascular:     Rate and Rhythm: Normal rate and regular rhythm.     Heart sounds: No murmur heard. Pulmonary:     Effort: Pulmonary effort is normal. No respiratory distress.     Breath sounds: Normal breath sounds.  Abdominal:     Palpations: Abdomen is soft.     Tenderness: There is no abdominal tenderness. There is no guarding or rebound.  Musculoskeletal:  General: No deformity.     Cervical back: Neck supple.  Skin:    General: Skin is warm and dry.  Neurological:     General: No focal deficit present.     Mental Status: He is alert.     GCS: GCS eye subscore is 4. GCS verbal subscore is 5. GCS motor subscore is 6.     Cranial Nerves: No cranial nerve deficit.     Sensory: No sensory deficit.     Motor: No weakness.     Comments: Patient has no evidence of facial droop.  He has normal speech.  No focal numbness or weakness.     (all labs ordered are listed, but only abnormal results are displayed) Labs Reviewed  COMPREHENSIVE METABOLIC PANEL WITH GFR - Abnormal; Notable for the following components:       Result Value   Glucose, Bld 131 (*)    Albumin  3.2 (*)    AST 13 (*)    All other components within normal limits  URINALYSIS, ROUTINE W REFLEX MICROSCOPIC - Abnormal; Notable for the following components:   Specific Gravity, Urine >1.046 (*)    pH 9.0 (*)    All other components within normal limits  I-STAT CHEM 8, ED - Abnormal; Notable for the following components:   Glucose, Bld 130 (*)    Calcium , Ion 1.14 (*)    All other components within normal limits  ETHANOL  PROTIME-INR  APTT  CBC  DIFFERENTIAL  RAPID URINE DRUG SCREEN, HOSP PERFORMED  TROPONIN I (HIGH SENSITIVITY)  TROPONIN I (HIGH SENSITIVITY)    EKG: EKG Interpretation Date/Time:  Saturday November 02 2023 13:07:48 EDT Ventricular Rate:  62 PR Interval:  151 QRS Duration:  88 QT Interval:  433 QTC Calculation: 440 R Axis:   73  Text Interpretation: Sinus rhythm Borderline repolarization abnormality nonspecific t waves compared with prior 9/24 Confirmed by Towana Sharper [phone]) on 11/02/2023 1:09:46 PM  Radiology: MR BRAIN WO CONTRAST Result Date: 11/02/2023 EXAM: MRI BRAIN WITHOUT CONTRAST 11/02/2023 02:24:40 PM TECHNIQUE: Multiplanar multisequence MRI of the head/brain was performed without the administration of intravenous contrast. COMPARISON: CT head and CTA head and neck 11/02/2023 CLINICAL HISTORY: Neuro deficit, acute, stroke suspected. FINDINGS: BRAIN AND VENTRICLES: An acute 10 mm nonhemorrhagic infarct is present in the lateral right thalamus. T2 and FLAIR signal is associated. Remote lacunar infarcts are present within the thalami bilaterally. Remote lacunar infarcts are present in the right paramedian pons and within the left cerebellum. Confluent periventricular and subcortical white matter T2 signal changes are advanced for age. Multiple punctate foci of susceptibility are present within the thalami, cerebellum, and brainstem and to a lesser extent scattered in the subcortical white matter over  the convexities. Prominent vascular spaces are present in the basal ganglia. ORBITS: No acute abnormality. SINUSES AND MASTOIDS: Polyps are present within the nasal cavity. Mild mucosal thickening is present in the left greater than right maxillary sinus. Scattered mucosal thickening is present throughout the ethmoid air cells and sphenoid sinuses. The right frontal sinus is opacified. BONES AND SOFT TISSUES: Normal marrow signal. No acute soft tissue abnormality. IMPRESSION: 1. Acute 10 mm nonhemorrhagic infarct in the lateral right putamen nucleus with associated T2 and FLAIR signal. 2. Advanced confluent periventricular and subcortical white matter T2 signal changes for age. 3. Multiple punctate foci of susceptibility within the thalami, cerebellum, brainstem, and scattered in the subcortical white matter over the convexities. This is consistent with microhemorrhages and suggests underlying vasculitis 4. Remote  lacunar infarcts in the thalami bilaterally, right paramedian pons, and left cerebellum. The pertinent results were texted to Dr. Vanessa via the [facility] system at 3:32 pm. Electronically signed by: Lonni Necessary MD 11/02/2023 03:35 PM EDT RP Workstation: [id number]   CT ANGIO HEAD NECK W WO CM (CODE STROKE) Result Date: 11/02/2023 CLINICAL DATA:  83 year old male code stroke presentation, aphasia. EXAM: CT ANGIOGRAPHY HEAD AND NECK TECHNIQUE: Multidetector CT imaging of the head and neck was performed using the standard protocol during bolus administration of intravenous contrast. Multiplanar CT image reconstructions and MIPs were obtained to evaluate the vascular anatomy. Carotid stenosis measurements (when applicable) are obtained utilizing NASCET criteria, using the distal internal carotid diameter as the denominator. RADIATION DOSE REDUCTION: This exam was performed according to the departmental dose-optimization program which includes automated exposure control, adjustment of the mA and/or  kV according to patient size and/or use of iterative reconstruction technique. CONTRAST:  75mL OMNIPAQUE  IOHEXOL  350 MG/ML SOLN COMPARISON:  Head CT 1250 hours today. CTA head and neck 05/02/2016. FINDINGS: CTA NECK Skeleton: Sternotomy. Ordinary cervical spine degeneration. No acute osseous abnormality identified. Upper chest: Sternotomy may be new since 2018. Evidence of CABG. Visible central pulmonary arteries appear patent, enhancing. Mild upper lung atelectasis. Other neck: Nonvascular neck soft tissue spaces appear stable since 2018. Small chronic retention cyst of the right epiglottic fold suspected (series 7, image 106), stable. No acute or suspicious finding identified. Aortic arch: Calcified aortic atherosclerosis.  3 vessel arch. Right carotid system: Brachiocephalic artery tortuosity and atherosclerosis without stenosis. Mildly tortuous proximal right CCA. Patent right carotid bifurcation with mild plaque. No significant stenosis to the skull base. Left carotid system: Left CCA origin plaque without stenosis. Tortuous left CCA. Mild plaque from the left carotid bifurcation through the left ICA bulb without stenosis. Vertebral arteries: Soft plaque proximal right subclavian artery is mild without stenosis. Mild calcified plaque at the right vertebral artery origin without stenosis. Non dominant right vertebral artery has a stable caliber since 2018, remains diminutive but patent to the skull base. No stenosis in the neck. Proximal left subclavian artery soft calcified plaque without stenosis. Mild tortuosity. Calcified plaque at the left vertebral origin. No significant stenosis. Dominant left vertebral artery, tortuous V1 segment. No other left vertebral plaque or stenosis to the skull base. CTA HEAD Posterior circulation: Dominant left vertebral artery with normal PICA origins primarily supplies the basilar without plaque or stenosis. Right V4 segment is diminutive but remains patent to the  vertebrobasilar junction. Right AICA appears to be dominant. Patent basilar artery with mild irregularity. No basilar stenosis. Patent SCA and PCA origins. Posterior communicating arteries are diminutive or absent. Stable basilar tip since 2018. Chronic bilateral PCA atherosclerosis and irregularity. Bilateral PCA branches remain patent. Up to moderate left P2 irregularity, mild-to-moderate stenosis does appear increased since 2018 on series 605, image 45. Contralateral mild right P2 irregularity appears stable. Anterior circulation: Both ICA siphons are patent. Bulky left siphon cavernous and proximal supraclinoid segment calcified plaque. And moderate distal cavernous and anterior genu left ICA stenosis appears increased since 2018. Contralateral right siphon moderate calcified plaque with mild cavernous but up to moderate supraclinoid right ICA stenosis (series 5, image 104) appears stable since 2018. Carotid termini are patent. MCA and ACA origins are patent. Dominant left A1 again noted. Normal anterior communicating artery. Bilateral ACA branches are patent. But moderate left ACA A2 irregularity and stenosis has increased since 2018 on series 9, image 39. Left MCA M1 segment and bifurcation are  patent without stenosis. Right MCA M1 segment and trifurcation are patent with mild irregularity, no stenosis. Bilateral MCA branches are stable. No significant branch stenosis identified. Venous sinuses: Early contrast timing, not evaluated. Anatomic variants: Dominant left vertebral artery, right is diminutive. Dominant left ACA A1. Review of the MIP images confirms the above findings IMPRESSION: 1. Negative for large vessel occlusion. 2. Positive for abundant atherosclerosis in the head and neck. Notable and/or increased since 2018 CTA stenoses: - Moderate LICA siphon stenosis appears progressed since 2018. Moderate Right siphon stenosis appears stable. - Left ACA A2 segment Moderate stenosis is increased. - Moderate  Left PCA P2 stenosis appears increased 3. No significant extracranial stenosis. 4.  Aortic Atherosclerosis (ICD10-I70.0). Electronically Signed   By: VEAR Hurst M.D.   On: 11/02/2023 13:16   CT HEAD CODE STROKE WO CONTRAST Result Date: 11/02/2023 CLINICAL DATA:  Code stroke.  83 year old male.  Aphasia. EXAM: CT HEAD WITHOUT CONTRAST TECHNIQUE: Contiguous axial images were obtained from the base of the skull through the vertex without intravenous contrast. RADIATION DOSE REDUCTION: This exam was performed according to the departmental dose-optimization program which includes automated exposure control, adjustment of the mA and/or kV according to patient size and/or use of iterative reconstruction technique. COMPARISON:  Brain MRI 11/04/2017.  Head CT 08/08/2020. FINDINGS: Brain: Stable cerebral volume. No ventriculomegaly. No midline shift, mass effect, or evidence of intracranial mass lesion. No acute intracranial hemorrhage identified. Advanced chronic small vessel disease. Confluent, widespread bilateral cerebral white matter hypodensity and extensive heterogeneity in the bilateral deep gray nuclei. No significant change from the 2022 CT. Posterior fossa appears relatively spared. No cortically based acute infarct identified. Vascular: Calcified atherosclerosis at the skull base. No suspicious intracranial vascular hyperdensity. Skull: Stable and intact. Sinuses/Orbits: Chronic sinonasal polyposis, stable since 2022. Areas of associated chronic paranasal sinus periosteal thickening. Tympanic cavities and mastoids remain well aerated. Other: No gaze deviation. Visualized orbits and scalp soft tissues are within normal limits. ASPECTS [facility] Stroke Program Early CT Score) Total score (0-10 with 10 being normal): 10 IMPRESSION: 1. No acute cortically based infarct or acute intracranial hemorrhage identified. ASPECTS 10. 2. Advanced chronic small vessel disease appears stable by CT since 2022. 3. Chronic sinonasal  polyposis. #1 and #2 communicated to Dr. Khaliqdina at 12:59 pm on 11/02/2023 by text page via the [facility] messaging system. Electronically Signed   By: VEAR Hurst M.D.   On: 11/02/2023 13:00     Procedures   Medications Ordered in the ED  iohexol  (OMNIPAQUE ) 350 MG/ML injection 75 mL (75 mLs Intravenous Contrast Given 11/02/23 1300)  LORazepam  (ATIVAN ) injection 1 mg (1 mg Intravenous Given 11/02/23 1357)  acetaminophen  (TYLENOL ) tablet 650 mg (650 mg Oral Given 11/02/23 1506)    Clinical Course as of 11/02/23 1624  Sat Nov 02, 2023  1339 Patient was seen by neurology Dr. Vanessa.  He does not feel he is a TNK candidate without any focal deficits.  They are ordering an MRI for further evaluation. [MB]    Clinical Course User Index [MB] Towana Ozell BROCKS, MD                                 Medical Decision Making Amount and/or Complexity of Data Reviewed Labs: ordered.  Risk OTC drugs. Prescription drug management.   This patient complains of syncopal event possible slurred speech facial droop; this involves an extensive number of treatment Options and is  a complaint that carries with it a high risk of complications and morbidity. The differential includes seizure, stroke, arrhythmia, ACS  I ordered, reviewed and interpreted labs, which included CBC unremarkable, chemistries with a mildly elevated glucose, urinalysis without signs of infection, troponins flat I ordered medication IV Ativan  for sedation MRI and reviewed PMP when indicated. I ordered imaging studies which included CT head CT angio head and neck MRI brain and I independently    visualized and interpreted imaging which showed acute stroke Additional history obtained from patient's son and EMS Previous records obtained and reviewed in epic including outpatient cardiology and PCP notes I consulted neurohospitalist Dr. Vanessa and discussed lab and imaging findings and discussed disposition.  Cardiac monitoring  reviewed, sinus rhythm Social determinants considered, financial insecurity physically inactive Critical Interventions: Stroke activation requiring bedside presence and discussions with neurology, interpretation of complex data  After the interventions stated above, I reevaluated the patient and found patient to be feeling better with no gross focal deficits Admission and further testing considered, will review with neurology but anticipate may need admission at the hospital.  Care signed out to Dr. Elnor to follow-up on neurorecommendations.      Final diagnoses:  Acute CVA (cerebrovascular accident) [facility])  Syncope and collapse    ED Discharge Orders     None          Towana Ozell BROCKS, MD 11/02/23 1627

## 2023-11-02 NOTE — ED Notes (Signed)
 Repeat attempts at NIH assessment, patient's mentation making it quite difficult to fully assess. All 4 extremities moving well, patient can speak but family states is different from his normal.

## 2023-11-02 NOTE — ED Notes (Signed)
 Patient's mentation much better, able to perform neuro exam. Patient had urinated on self, so he was changed and condom cath placed.

## 2023-11-02 NOTE — ED Triage Notes (Addendum)
 Patient arrived from church by EMS after slumping over at church. Patient then unable to swallow and confused. Family also reports speech different than normal. CBG  131

## 2023-11-02 NOTE — ED Provider Notes (Addendum)
 3:00 PM Patient signed out to me by previous ED physician. Pt is a 83 yo male who was brought to ED by EMS after slumping over in church... possibly facial droop or slurred speech so code stroke activated but none on arrival.  Code stroke was discontinued. No neurovascular deficits. Mri ordered and pending.   Complains of a sore throat.. pt very liable and tearful which family says is unlike him.  Imaging pending.   Physical Exam  BP (!) 153/90   Pulse (!) 58   Temp (!) 97.2 F (36.2 C) (Oral)   Resp 17   Ht 5' 6 (1.676 m)   Wt 72.6 kg   SpO2 100%   BMI 25.82 kg/m   Physical Exam  Procedures  .Critical Care  Performed by: Elnor Bernarda SQUIBB, DO Authorized by: Elnor Bernarda SQUIBB, DO   Critical care provider statement:    Critical care time (minutes):  75   Critical care was necessary to treat or prevent imminent or life-threatening deterioration of the following conditions: acute cva.   Critical care was time spent personally by me on the following activities:  Development of treatment plan with patient or surrogate, discussions with consultants, evaluation of patient's response to treatment, examination of patient, ordering and review of laboratory studies, ordering and review of radiographic studies, ordering and performing treatments and interventions, pulse oximetry, re-evaluation of patient's condition and review of old charts   Care discussed with: admitting provider     Care discussed with comment:  Neurology   ED Course / MDM   Clinical Course as of 11/02/23 1500  Sat Nov 02, 2023  1339 Patient was seen by neurology Dr. Vanessa.  He does not feel he is a TNK candidate without any focal deficits.  They are ordering an MRI for further evaluation. [MB]    Clinical Course User Index [MB] Towana Ozell BROCKS, MD   Medical Decision Making Amount and/or Complexity of Data Reviewed Labs: ordered.  Risk OTC drugs. Prescription drug management. Decision regarding  hospitalization.    MRI demonstrates: 1. Acute 10 mm nonhemorrhagic infarct in the lateral right putamen nucleus with associated T2 and FLAIR signal. 2. Advanced confluent periventricular and subcortical white matter T2 signal changes for age. 3. Multiple punctate foci of susceptibility within the thalami, cerebellum, brainstem, and scattered in the subcortical white matter over the convexities. This is consistent with microhemorrhages and suggests underlying vasculitis 4. Remote lacunar infarcts in the thalami bilaterally, right paramedian pons, and left cerebellum. The pertinent results were texted to Dr. Vanessa via the [facility] system at 3:32 pm.  Neurology consulted Admitting team paged  Family up to date and going to go home. Will return in the morning during visiting hours.   Pt states he was out of some of his meds: family confirmed he has empty bottles for rovastatin, plavix , and lasix  at the house. Has full bottles for amlodipine  and carvedilol . Pt states he has been out for aprox one month-family was unaware. Was on eliquis  for one time PE previously but no longer taking.   Patient accepted for admission by Dr. Alfornia.      Elnor Bernarda SQUIBB, DO 11/02/23 1949

## 2023-11-02 NOTE — Consult Note (Signed)
 NEUROLOGY CONSULT NOTE   Date of service: November 02, 2023 Patient Name: [name] MRN:  [id number] DOB:  08/30/1940 Chief Complaint: Code Stroke Requesting Provider: Towana Ozell BROCKS, MD  History of Present Illness  [name] is a 83 y.o. male with hx of CAD with CABGx4, PE previously on eliquis  presenting from church after a syncopal episode with dysarthria, slurred speech, chest pain. Family states that he did have slurred speech this morning but otherwise felt fine. On arrival he appears uncomfortable, not answering questions for us , grabbing at his chest and throat. After imaging in the room he states that he does not remember what happened. Exam is non focal. BP 168/84. Troponin added to labs. Glucose 130. Patient does not have any focal deficits, but appears agitated, tearful, and confused.   LKW: 1130 Modified rankin score: 1-No significant post stroke disability and can perform usual duties with stroke symptoms IV Thrombolysis: No, too mild to treat EVT: No, no LVO    NIHSS components Score: Comment  1a Level of Conscious 0[]  1[]  2[]  3[]      1b LOC Questions 0[]  1[x]  2[]       1c LOC Commands 0[]  1[]  2[]       2 Best Gaze 0[]  1[]  2[]       3 Visual 0[]  1[]  2[]  3[]      4 Facial Palsy 0[]  1[]  2[]  3[]      5a Motor Arm - left 0[]  1[]  2[]  3[]  4[]  UN[]    5b Motor Arm - Right 0[]  1[]  2[]  3[]  4[]  UN[]    6a Motor Leg - Left 0[]  1[]  2[]  3[]  4[]  UN[]    6b Motor Leg - Right 0[]  1[]  2[]  3[]  4[]  UN[]    7 Limb Ataxia 0[]  1[]  2[]  UN[]      8 Sensory 0[]  1[]  2[]  UN[]      9 Best Language 0[]  1[]  2[]  3[]      10 Dysarthria 0[]  1[]  2[]  UN[]      11 Extinct. and Inattention 0[]  1[]  2[]       TOTAL:1       ROS  Comprehensive ROS performed and pertinent positives documented in HPI   Past History   Past Medical History:  Diagnosis Date   Blurred vision 05/17/2016   CAD (coronary artery disease), native coronary artery 06/14/2008   a. BMS to LCx & OM 2008 b. 03/2017 CABG x 4  (LIMA to LAD, SVG to diag 1, SVG to distal Circ, SVG to PDA)   Chest pain 04/16/2012   Diplopia    Echocardiogram    Echo 10/19: mod LVH, EF 55-60, no RWMA, Gr 1 DD, trivial AI, MAC, mod LAE, prob small post effusion   Essential hypertension 05/17/2016   Hyperlipidemia with target low density lipoprotein (LDL) cholesterol less than 70 mg/dL 5/73/7989   Qualifier: Diagnosis of  By: [name], RMA, [name]     Hypertension    Hypertensive heart disease 06/14/2008   Qualifier: Diagnosis of  By: [name], RMA, [name]     Hypertensive urgency, malignant 04/16/2012   Hypokalemia 04/14/2017   Internal hemorrhoids 04/19/2012   Ischemic stroke (HCC) 05/02/2016   Non-ST elevation (NSTEMI) myocardial infarction [facility])    pandiverticulosis 04/22/2012   12/17/2011. [facility]. Renaye Sous MD. Colonoscopy. Moderate sized internal hemorrhoids and extensive pandiverticulosis. Repeat 5 years.     Pericardial effusion    Echo 11/19: mild focal basal septal hypertrophy, EF 55-60, Gr 2 DD, trivial AI, trivial TR, trivial PI, small to mod eff post to heart - no  evidence of RV collapse.>> repeat limited echo in 03/2018 // Echo 03/2018: EF 55-60, mild LVH, +diastolic dysfunction, no RWMA, PASP 23, trivial pericardial effusion    Polycythemia vera(238.4) 04/17/2012   S/P CABG x 4 04/17/2017   Syncope and collapse 04/17/2012   Pt syncopized while sitting in bed giving history @ time of admission to hospital Tachycardic (appeared sinus) to 120s-130s and hypotensive to 50s/30s.  Unresponsive initially >> Spontaneously resolved after 2-3 minutes >> return to baseline ~30 minutes    Vertigo     Past Surgical History:  Procedure Laterality Date   CORONARY ARTERY BYPASS GRAFT N/A 04/17/2017   Procedure: CORONARY ARTERY BYPASS GRAFTING (CABG) x4 , using left internal mammary artery  to LAD and right leg greater saphenous vein harvested endoscopically  to PDA, Diagonal I and Circumflex;  Surgeon: [name], MD;   Location: [facility] OR;  Service: Open Heart Surgery;  Laterality: N/A;   CORONARY ARTERY BYPASS GRAFT  2020   CORONARY STENT PLACEMENT     LEFT HEART CATH AND CORONARY ANGIOGRAPHY N/A 04/16/2017   Procedure: LEFT HEART CATH AND CORONARY ANGIOGRAPHY;  Surgeon: [name], MD;  Location: [facility] INVASIVE CV LAB;  Service: Cardiovascular;  Laterality: N/A;   TEE WITHOUT CARDIOVERSION N/A 04/17/2017   Procedure: TRANSESOPHAGEAL ECHOCARDIOGRAM (TEE);  Surgeon: [name], MD;  Location: [facility] OR;  Service: Open Heart Surgery;  Laterality: N/A;    Family History: Family History  Problem Relation Age of Onset   Heart disease Mother    Cancer Sister     Social History  reports that he has never smoked. He has never used smokeless tobacco. He reports that he does not drink alcohol and does not use drugs.  Allergies  Allergen Reactions   Lipitor [Atorvastatin ] Shortness Of Breath and Other (See Comments)    Sores non head   Morphine  And Codeine Shortness Of Breath, Swelling and Rash    Throat swelling   Pork-Derived Products Swelling and Rash    Tongue swelling No pork products -     Medications  No current facility-administered medications for this encounter.  Current Outpatient Medications:    acetaminophen  (TYLENOL ) 325 MG tablet, Take 325 mg by mouth daily., Disp: , Rfl:    amLODipine  (NORVASC ) 5 MG tablet, Take 1 tablet (5 mg total) by mouth daily., Disp: 90 tablet, Rfl: 3   apixaban  (ELIQUIS ) 5 MG TABS tablet, Take 1 tablet (5 mg total) by mouth 2 (two) times daily., Disp: 60 tablet, Rfl: 5   carvedilol  (COREG ) 12.5 MG tablet, Take 1 tablet (12.5 mg total) by mouth 2 (two) times daily., Disp: 180 tablet, Rfl: 3   HYDROcodone -acetaminophen  (NORCO) 5-325 MG tablet, Take 1 tablet by mouth every 6 (six) hours as needed for moderate pain., Disp: 15 tablet, Rfl: 0   pantoprazole  (PROTONIX ) 40 MG tablet, Take 1 tablet (40 mg total) by mouth daily., Disp: 90 tablet, Rfl: 3   rosuvastatin   (CRESTOR ) 40 MG tablet, Take 1 tablet (40 mg total) by mouth daily., Disp: 90 tablet, Rfl: 3   valsartan  (DIOVAN ) 160 MG tablet, Take 1 tablet (160 mg total) by mouth daily., Disp: 90 tablet, Rfl: 3  Vitals   There were no vitals filed for this visit.  There is no height or weight on file to calculate BMI.   Physical Exam   Constitutional: Appears well-developed and well-nourished.  Psych: Affect appropriate to situation.  Eyes: No scleral injection.  HENT: No OP obstruction.  Head: Normocephalic.  Cardiovascular: Normal rate and regular rhythm.  Respiratory: Effort normal, non-labored breathing.  GI: Soft.  No distension. There is no tenderness.  Skin: WDI.   Neurologic Examination    Neuro: Mental Status: Patient is awake, alert, oriented to person and year,. Unable to answer location or month on initial exam  Unable to tell use what happened  No signs of aphasia or neglect Cranial Nerves: II: Visual Fields are full. Pupils are equal, round, and reactive to light.   III,IV, VI: EOMI without ptosis or diploplia.  V: Facial sensation is symmetric to temperature VII: Facial movement is symmetric resting and smiling VIII: Hearing is intact to voice X: Palate elevates symmetrically XI: Shoulder shrug is symmetric. XII: Tongue protrudes midline without atrophy or fasciculations.  Motor: Tone is normal. Bulk is normal. 5/5 strength was present in all four extremities. No drift  Sensory: Sensation is symmetric to light touch and temperature in the arms and legs.  Cerebellar: FNF and HKS are intact bilaterally   Labs/Imaging/Neurodiagnostic studies   CBC: No results for input(s): WBC, NEUTROABS, HGB, HCT, MCV, PLT in the last 168 hours. Basic Metabolic Panel:  Lab Results  Component Value Date   NA 138 11/15/2022   K 3.2 (L) 11/15/2022   CO2 23 11/15/2022   GLUCOSE 97 11/15/2022   BUN 11 11/15/2022   CREATININE 0.91 11/15/2022   CALCIUM  8.8 (L)  11/15/2022   GFRNONAA >60 11/15/2022   GFRAA 85 08/14/2019   Lipid Panel:  Lab Results  Component Value Date   LDLCALC 51 03/07/2021   HgbA1c:  Lab Results  Component Value Date   HGBA1C 6.0 03/07/2021   Urine Drug Screen: No results found for: LABOPIA, COCAINSCRNUR, LABBENZ, AMPHETMU, THCU, LABBARB  Alcohol Level No results found for: [facility] INR  Lab Results  Component Value Date   INR 1.55 04/17/2017   APTT  Lab Results  Component Value Date   APTT 28 04/17/2017    CT Head without contrast(Personally reviewed): No acute cortically based infarct or acute intracranial hemorrhage identified. ASPECTS 10.  CT angio Head and Neck with contrast(Personally reviewed): Positive for abundant atherosclerosis in the head and neck. Notable and/or increased since 2018 CTA stenoses:  Moderate LICA siphon stenosis appears progressed since 2018. Moderate Right siphon stenosis appears stable.  Left ACA A2 segment Moderate stenosis is increased. Moderate Left PCA P2 stenosis appears increased No significant extracranial stenosis. Aortic Atherosclerosis (ICD10-I70.0).   ASSESSMENT   Jester Klingberg is a 83 y.o. male with a PMH of CABG, PE presenting after a syncopal episode with slurred speech. No focal weakness on exam. Code stroke cancelled, syncope work up per EDP.   RECOMMENDATIONS  - Syncope work up per EDP - Troponin pending  - MRI brain wo  ______________________________________________________________________    Signed, Jorene Last, NP [facility]   NEUROHOSPITALIST ADDENDUM Performed a face to face diagnostic evaluation.   I have reviewed the contents of history and physical exam as documented by PA/ARNP/Resident and agree with above documentation.  I have discussed and formulated the above plan as documented. Edits to the note have been made as needed.  Impression/Key exam findings/Plan: attempted to talk to patient with a video interpretor.  He is very distraught, holding onto his chest and throat. He is able to state his name, age. Almost looks like he is in a panic attack. Neuro exam however, is non focal. Further hx from son with a facial droop and drooling while at church after syncopal episode.  He was not deemed candidate for tnkase due to no deficit, he was not deemed candidate for thrombectomy due to no LVO.  If the MRI brain is negative, can be discharged with outpatient follow up. Even if the MRI shows a stroke, I dont think that would explain the constellation of his syncope, chest and throat discomfort.  Trisa Cranor, MD [facility] [phone]   If 7pm to 7am, please call on call as listed on AMION.

## 2023-11-02 NOTE — H&P (Signed)
 History and Physical    [name] FMW:[id number] DOB: 1940/05/18 DOA: 11/02/2023  PCP: Purcell [name], MD  Patient coming from: Home  Chief Complaint: Syncope  HPI: [name] is a 83 y.o. male with medical history significant of CAD status post CABG x 4 in 2019, pericardial mass, history of recurrent presyncope/syncope, hypertension, hyperlipidemia, CVA, polycythemia vera, HFpEF, history of PE no longer on anticoagulation presented to the ED from church after syncopal episode with dysarthria, slurred speech, and chest pain.  Last known well at 11:30 AM.  Evaluated by neurology on arrival and code stroke canceled as patient did not have any focal neurodeficits but appeared agitated, tearful, and confused.  CT head negative for acute intracranial abnormality.  CTA head and neck negative for LVO.  Brain MRI showing acute 10 mm nonhemorrhagic infarct in the lateral right putamen nucleus with associated T2 and FLAIR signal.  Advanced confluent periventricular and subcortical white matter T2 signal changes for age.  Showing multiple punctate foci of susceptibility within the thalami, cerebellum, brainstem, and scattered in the subcortical white matter over the convexities.  Findings consistent with microhemorrhages and suggesting underlying vasculitis.  Remote lacunar infarcts in the thalami bilaterally, right paramedian pons, and left cerebellum.  No significant lab abnormalities on CBC and CMP.  Ethanol level <15, UDS negative, UA not suggestive of infection, troponin negative x 2.  Patient was given Tylenol  and Ativan .  TR[facility] called to admit.  Patient states he was sitting at church listening to the priest and then does not remember what happened.  He was told by family and bystanders that he had passed out and EMS was called.  Denies preceding lightheadedness/dizziness, palpitations, or chest pain.  He does mention that when they initially brought him to the ED, he could not speak and  felt like his throat was closing and was also having some upper chest discomfort at that time which has since resolved.  Denies shortness of breath.  He reports history of CAD with prior CABG and takes aspirin  81 mg daily.  He is no longer on anticoagulation.  He is not aware of having history of previous stroke.  No other complaints.  Denies nausea, vomiting, or abdominal pain.  Review of Systems:  Review of Systems  All other systems reviewed and are negative.   Past Medical History:  Diagnosis Date   Blurred vision 05/17/2016   CAD (coronary artery disease), native coronary artery 06/14/2008   a. BMS to LCx & OM 2008 b. 03/2017 CABG x 4 (LIMA to LAD, SVG to diag 1, SVG to distal Circ, SVG to PDA)   Chest pain 04/16/2012   Diplopia    Echocardiogram    Echo 10/19: mod LVH, EF 55-60, no RWMA, Gr 1 DD, trivial AI, MAC, mod LAE, prob small post effusion   Essential hypertension 05/17/2016   Hyperlipidemia with target low density lipoprotein (LDL) cholesterol less than 70 mg/dL 5/73/7989   Qualifier: Diagnosis of  By: [name], RMA, [name]     Hypertension    Hypertensive heart disease 06/14/2008   Qualifier: Diagnosis of  By: [name], RMA, [name]     Hypertensive urgency, malignant 04/16/2012   Hypokalemia 04/14/2017   Internal hemorrhoids 04/19/2012   Ischemic stroke (HCC) 05/02/2016   Non-ST elevation (NSTEMI) myocardial infarction [facility])    pandiverticulosis 04/22/2012   12/17/2011. [facility]. Renaye Sous MD. Colonoscopy. Moderate sized internal hemorrhoids and extensive pandiverticulosis. Repeat 5 years.     Pericardial effusion    Echo  11/19: mild focal basal septal hypertrophy, EF 55-60, Gr 2 DD, trivial AI, trivial TR, trivial PI, small to mod eff post to heart - no evidence of RV collapse.>> repeat limited echo in 03/2018 // Echo 03/2018: EF 55-60, mild LVH, +diastolic dysfunction, no RWMA, PASP 23, trivial pericardial effusion    Polycythemia vera(238.4) 04/17/2012   S/P CABG x  4 04/17/2017   Syncope and collapse 04/17/2012   Pt syncopized while sitting in bed giving history @ time of admission to hospital Tachycardic (appeared sinus) to 120s-130s and hypotensive to 50s/30s.  Unresponsive initially >> Spontaneously resolved after 2-3 minutes >> return to baseline ~30 minutes    Vertigo     Past Surgical History:  Procedure Laterality Date   CORONARY ARTERY BYPASS GRAFT N/A 04/17/2017   Procedure: CORONARY ARTERY BYPASS GRAFTING (CABG) x4 , using left internal mammary artery  to LAD and right leg greater saphenous vein harvested endoscopically  to PDA, Diagonal I and Circumflex;  Surgeon: [name], MD;  Location: [facility] OR;  Service: Open Heart Surgery;  Laterality: N/A;   CORONARY ARTERY BYPASS GRAFT  2020   CORONARY STENT PLACEMENT     LEFT HEART CATH AND CORONARY ANGIOGRAPHY N/A 04/16/2017   Procedure: LEFT HEART CATH AND CORONARY ANGIOGRAPHY;  Surgeon: [name], MD;  Location: [facility] INVASIVE CV LAB;  Service: Cardiovascular;  Laterality: N/A;   TEE WITHOUT CARDIOVERSION N/A 04/17/2017   Procedure: TRANSESOPHAGEAL ECHOCARDIOGRAM (TEE);  Surgeon: [name], MD;  Location: [facility] OR;  Service: Open Heart Surgery;  Laterality: N/A;     reports that he has never smoked. He has never used smokeless tobacco. He reports that he does not drink alcohol and does not use drugs.  Allergies  Allergen Reactions   Lipitor [Atorvastatin ] Shortness Of Breath and Other (See Comments)    Sores non head   Morphine  And Codeine Shortness Of Breath, Swelling and Rash    Throat swelling   Pork-Derived Products Swelling and Rash    Tongue swelling No pork products -     Family History  Problem Relation Age of Onset   Heart disease Mother    Cancer Sister     Prior to Admission medications   Medication Sig Start Date End Date Taking? Authorizing Provider  acetaminophen  (TYLENOL ) 325 MG tablet Take 325 mg by mouth daily.    [provider]  amLODipine   (NORVASC ) 5 MG tablet Take 1 tablet (5 mg total) by mouth daily. 08/08/22   [name], MD  apixaban  (ELIQUIS ) 5 MG TABS tablet Take 1 tablet (5 mg total) by mouth 2 (two) times daily. 01/30/22   [name], MD  carvedilol  (COREG ) 12.5 MG tablet Take 1 tablet (12.5 mg total) by mouth 2 (two) times daily. 08/08/22   [name], MD  HYDROcodone -acetaminophen  (NORCO) 5-325 MG tablet Take 1 tablet by mouth every 6 (six) hours as needed for moderate pain. 07/30/22   Purcell [name], MD  pantoprazole  (PROTONIX ) 40 MG tablet Take 1 tablet (40 mg total) by mouth daily. 03/07/21   Sagardia, Miguel Jose, MD  rosuvastatin  (CRESTOR ) 40 MG tablet Take 1 tablet (40 mg total) by mouth daily. 08/08/22   [name], MD  valsartan  (DIOVAN ) 160 MG tablet Take 1 tablet (160 mg total) by mouth daily. 08/08/22   [name], MD    Physical Exam: Vitals:   11/02/23 1630 11/02/23 1725 11/02/23 1900 11/02/23 2000  BP: (!) 146/97  (!) 152/85 (!) 159/79  Pulse: (!) 57  ROLLEN)  56 (!) 53  Resp: 17  12 16   Temp:  98.8 F (37.1 C)    TempSrc:  Oral    SpO2: 99%  97% 98%  Weight:      Height:        Physical Exam Vitals reviewed.  Constitutional:      General: He is not in acute distress. HENT:     Head: Normocephalic and atraumatic.  Eyes:     Extraocular Movements: Extraocular movements intact.  Cardiovascular:     Rate and Rhythm: Normal rate and regular rhythm.     Heart sounds: Normal heart sounds.  Pulmonary:     Effort: Pulmonary effort is normal. No respiratory distress.     Breath sounds: Normal breath sounds. No stridor. No wheezing, rhonchi or rales.  Abdominal:     General: Bowel sounds are normal. There is no distension.     Palpations: Abdomen is soft.     Tenderness: There is no abdominal tenderness. There is no guarding.  Musculoskeletal:     Cervical back: Normal range of motion.     Right lower leg: No edema.     Left lower leg: No edema.  Skin:    General: Skin  is warm and dry.  Neurological:     General: No focal deficit present.     Mental Status: He is alert and oriented to person, place, and time.     Cranial Nerves: No cranial nerve deficit.     Sensory: No sensory deficit.     Motor: No weakness.     Labs on Admission: I have personally reviewed following labs and imaging studies  CBC: Recent Labs  Lab 11/02/23 1243 11/02/23 1248  WBC 9.0  --   NEUTROABS 6.1  --   HGB 13.6 13.6  HCT 41.8 40.0  MCV 88.0  --   PLT 234  --    Basic Metabolic Panel: Recent Labs  Lab 11/02/23 1243 11/02/23 1248  NA 136 139  K 3.7 3.7  CL 102 102  CO2 24  --   GLUCOSE 131* 130*  BUN 9 9  CREATININE 1.02 1.10  CALCIUM  8.9  --    GFR: Estimated Creatinine Clearance: 47.5 mL/min (by C-G formula based on SCr of 1.1 mg/dL). Liver Function Tests: Recent Labs  Lab 11/02/23 1243  AST 13*  ALT 10  ALKPHOS 92  BILITOT 0.8  PROT 6.9  ALBUMIN  3.2*   No results for input(s): LIPASE, AMYLASE in the last 168 hours. No results for input(s): AMMONIA in the last 168 hours. Coagulation Profile: Recent Labs  Lab 11/02/23 1243  INR 1.1   Cardiac Enzymes: No results for input(s): CKTOTAL, CKMB, CKMBINDEX, TROPONINI in the last 168 hours. BNP (last 3 results) No results for input(s): PROBNP in the last 8760 hours. HbA1C: No results for input(s): HGBA1C in the last 72 hours. CBG: No results for input(s): GLUCAP in the last 168 hours. Lipid Profile: No results for input(s): CHOL, HDL, LDLCALC, TRIG, CHOLHDL, LDLDIRECT in the last 72 hours. Thyroid  Function Tests: No results for input(s): TSH, T4TOTAL, FREET4, T3FREE, THYROIDAB in the last 72 hours. Anemia Panel: No results for input(s): VITAMINB12, FOLATE, FERRITIN, TIBC, IRON, RETICCTPCT in the last 72 hours. Urine analysis:    Component Value Date/Time   COLORURINE YELLOW 11/02/2023 1329   APPEARANCEUR CLEAR 11/02/2023 1329    LABSPEC >1.046 (H) 11/02/2023 1329   PHURINE 9.0 (H) 11/02/2023 1329   GLUCOSEU NEGATIVE 11/02/2023 1329   HGBUR  NEGATIVE 11/02/2023 1329   BILIRUBINUR NEGATIVE 11/02/2023 1329   BILIRUBINUR small 05/31/2014 1146   KETONESUR NEGATIVE 11/02/2023 1329   PROTEINUR NEGATIVE 11/02/2023 1329   UROBILINOGEN 0.2 05/31/2014 1146   UROBILINOGEN 0.2 01/21/2007 2134   NITRITE NEGATIVE 11/02/2023 1329   LEUKOCYTESUR NEGATIVE 11/02/2023 1329    Radiological Exams on Admission: MR BRAIN WO CONTRAST Result Date: 11/02/2023 EXAM: MRI BRAIN WITHOUT CONTRAST 11/02/2023 02:24:40 PM TECHNIQUE: Multiplanar multisequence MRI of the head/brain was performed without the administration of intravenous contrast. COMPARISON: CT head and CTA head and neck 11/02/2023 CLINICAL HISTORY: Neuro deficit, acute, stroke suspected. FINDINGS: BRAIN AND VENTRICLES: An acute 10 mm nonhemorrhagic infarct is present in the lateral right thalamus. T2 and FLAIR signal is associated. Remote lacunar infarcts are present within the thalami bilaterally. Remote lacunar infarcts are present in the right paramedian pons and within the left cerebellum. Confluent periventricular and subcortical white matter T2 signal changes are advanced for age. Multiple punctate foci of susceptibility are present within the thalami, cerebellum, and brainstem and to a lesser extent scattered in the subcortical white matter over the convexities. Prominent vascular spaces are present in the basal ganglia. ORBITS: No acute abnormality. SINUSES AND MASTOIDS: Polyps are present within the nasal cavity. Mild mucosal thickening is present in the left greater than right maxillary sinus. Scattered mucosal thickening is present throughout the ethmoid air cells and sphenoid sinuses. The right frontal sinus is opacified. BONES AND SOFT TISSUES: Normal marrow signal. No acute soft tissue abnormality. IMPRESSION: 1. Acute 10 mm nonhemorrhagic infarct in the lateral right putamen  nucleus with associated T2 and FLAIR signal. 2. Advanced confluent periventricular and subcortical white matter T2 signal changes for age. 3. Multiple punctate foci of susceptibility within the thalami, cerebellum, brainstem, and scattered in the subcortical white matter over the convexities. This is consistent with microhemorrhages and suggests underlying vasculitis 4. Remote lacunar infarcts in the thalami bilaterally, right paramedian pons, and left cerebellum. The pertinent results were texted to Dr. Vanessa via the [facility] system at 3:32 pm. Electronically signed by: Lonni Necessary MD 11/02/2023 03:35 PM EDT RP Workstation: [id number]   CT ANGIO HEAD NECK W WO CM (CODE STROKE) Result Date: 11/02/2023 CLINICAL DATA:  83 year old male code stroke presentation, aphasia. EXAM: CT ANGIOGRAPHY HEAD AND NECK TECHNIQUE: Multidetector CT imaging of the head and neck was performed using the standard protocol during bolus administration of intravenous contrast. Multiplanar CT image reconstructions and MIPs were obtained to evaluate the vascular anatomy. Carotid stenosis measurements (when applicable) are obtained utilizing NASCET criteria, using the distal internal carotid diameter as the denominator. RADIATION DOSE REDUCTION: This exam was performed according to the departmental dose-optimization program which includes automated exposure control, adjustment of the mA and/or kV according to patient size and/or use of iterative reconstruction technique. CONTRAST:  75mL OMNIPAQUE  IOHEXOL  350 MG/ML SOLN COMPARISON:  Head CT 1250 hours today. CTA head and neck 05/02/2016. FINDINGS: CTA NECK Skeleton: Sternotomy. Ordinary cervical spine degeneration. No acute osseous abnormality identified. Upper chest: Sternotomy may be new since 2018. Evidence of CABG. Visible central pulmonary arteries appear patent, enhancing. Mild upper lung atelectasis. Other neck: Nonvascular neck soft tissue spaces appear stable since 2018.  Small chronic retention cyst of the right epiglottic fold suspected (series 7, image 106), stable. No acute or suspicious finding identified. Aortic arch: Calcified aortic atherosclerosis.  3 vessel arch. Right carotid system: Brachiocephalic artery tortuosity and atherosclerosis without stenosis. Mildly tortuous proximal right CCA. Patent right carotid bifurcation with mild plaque. No  significant stenosis to the skull base. Left carotid system: Left CCA origin plaque without stenosis. Tortuous left CCA. Mild plaque from the left carotid bifurcation through the left ICA bulb without stenosis. Vertebral arteries: Soft plaque proximal right subclavian artery is mild without stenosis. Mild calcified plaque at the right vertebral artery origin without stenosis. Non dominant right vertebral artery has a stable caliber since 2018, remains diminutive but patent to the skull base. No stenosis in the neck. Proximal left subclavian artery soft calcified plaque without stenosis. Mild tortuosity. Calcified plaque at the left vertebral origin. No significant stenosis. Dominant left vertebral artery, tortuous V1 segment. No other left vertebral plaque or stenosis to the skull base. CTA HEAD Posterior circulation: Dominant left vertebral artery with normal PICA origins primarily supplies the basilar without plaque or stenosis. Right V4 segment is diminutive but remains patent to the vertebrobasilar junction. Right AICA appears to be dominant. Patent basilar artery with mild irregularity. No basilar stenosis. Patent SCA and PCA origins. Posterior communicating arteries are diminutive or absent. Stable basilar tip since 2018. Chronic bilateral PCA atherosclerosis and irregularity. Bilateral PCA branches remain patent. Up to moderate left P2 irregularity, mild-to-moderate stenosis does appear increased since 2018 on series 605, image 45. Contralateral mild right P2 irregularity appears stable. Anterior circulation: Both ICA siphons  are patent. Bulky left siphon cavernous and proximal supraclinoid segment calcified plaque. And moderate distal cavernous and anterior genu left ICA stenosis appears increased since 2018. Contralateral right siphon moderate calcified plaque with mild cavernous but up to moderate supraclinoid right ICA stenosis (series 5, image 104) appears stable since 2018. Carotid termini are patent. MCA and ACA origins are patent. Dominant left A1 again noted. Normal anterior communicating artery. Bilateral ACA branches are patent. But moderate left ACA A2 irregularity and stenosis has increased since 2018 on series 9, image 39. Left MCA M1 segment and bifurcation are patent without stenosis. Right MCA M1 segment and trifurcation are patent with mild irregularity, no stenosis. Bilateral MCA branches are stable. No significant branch stenosis identified. Venous sinuses: Early contrast timing, not evaluated. Anatomic variants: Dominant left vertebral artery, right is diminutive. Dominant left ACA A1. Review of the MIP images confirms the above findings IMPRESSION: 1. Negative for large vessel occlusion. 2. Positive for abundant atherosclerosis in the head and neck. Notable and/or increased since 2018 CTA stenoses: - Moderate LICA siphon stenosis appears progressed since 2018. Moderate Right siphon stenosis appears stable. - Left ACA A2 segment Moderate stenosis is increased. - Moderate Left PCA P2 stenosis appears increased 3. No significant extracranial stenosis. 4.  Aortic Atherosclerosis (ICD10-I70.0). Electronically Signed   By: VEAR Hurst M.D.   On: 11/02/2023 13:16   CT HEAD CODE STROKE WO CONTRAST Result Date: 11/02/2023 CLINICAL DATA:  Code stroke.  83 year old male.  Aphasia. EXAM: CT HEAD WITHOUT CONTRAST TECHNIQUE: Contiguous axial images were obtained from the base of the skull through the vertex without intravenous contrast. RADIATION DOSE REDUCTION: This exam was performed according to the departmental  dose-optimization program which includes automated exposure control, adjustment of the mA and/or kV according to patient size and/or use of iterative reconstruction technique. COMPARISON:  Brain MRI 11/04/2017.  Head CT 08/08/2020. FINDINGS: Brain: Stable cerebral volume. No ventriculomegaly. No midline shift, mass effect, or evidence of intracranial mass lesion. No acute intracranial hemorrhage identified. Advanced chronic small vessel disease. Confluent, widespread bilateral cerebral white matter hypodensity and extensive heterogeneity in the bilateral deep gray nuclei. No significant change from the 2022 CT. Posterior fossa appears  relatively spared. No cortically based acute infarct identified. Vascular: Calcified atherosclerosis at the skull base. No suspicious intracranial vascular hyperdensity. Skull: Stable and intact. Sinuses/Orbits: Chronic sinonasal polyposis, stable since 2022. Areas of associated chronic paranasal sinus periosteal thickening. Tympanic cavities and mastoids remain well aerated. Other: No gaze deviation. Visualized orbits and scalp soft tissues are within normal limits. ASPECTS [facility] Stroke Program Early CT Score) Total score (0-10 with 10 being normal): 10 IMPRESSION: 1. No acute cortically based infarct or acute intracranial hemorrhage identified. ASPECTS 10. 2. Advanced chronic small vessel disease appears stable by CT since 2022. 3. Chronic sinonasal polyposis. #1 and #2 communicated to Dr. Khaliqdina at 12:59 pm on 11/02/2023 by text page via the [facility] messaging system. Electronically Signed   By: VEAR Hurst M.D.   On: 11/02/2023 13:00    EKG: Independently reviewed.  Sinus rhythm, nonspecific T wave abnormalities.  Assessment and Plan  Acute CVA Patient [place] the ED from church after syncopal episode with dysarthria/slurred speech.  Last known well at 11:30 AM.  No focal neurodeficit on exam at this time. CT head negative for acute intracranial abnormality.  CTA head  and neck negative for LVO.  Brain MRI showing acute 10 mm nonhemorrhagic infarct in the lateral right putamen nucleus with associated T2 and FLAIR signal.  Advanced confluent periventricular and subcortical white matter T2 signal changes for age.  Showing multiple punctate foci of susceptibility within the thalami, cerebellum, brainstem, and scattered in the subcortical white matter over the convexities.  Findings consistent with microhemorrhages and suggesting underlying vasculitis.  Remote lacunar infarcts in the thalami bilaterally, right paramedian pons, and left cerebellum.  -Neurology consulting -Antithrombotic therapy per neurology -Telemetry monitoring -TTE -Hemoglobin A1c, fasting lipid panel -Frequent neurochecks -PT, OT, speech therapy. -N.p.o. until cleared by bedside swallow evaluation or formal speech evaluation   Chest pain/syncope Workup not suggestive of ACS.  PE less likely given no tachycardia or hypoxia.  UDS negative.  Patient is currently chest pain-free and resting comfortably.  Continue cardiac monitoring and echocardiogram ordered.  Hold home antihypertensives at this time to allow permissive hypertension in the setting of acute stroke.  CAD status post CABG x 4 in 2019 Workup not suggestive of ACS.  Hypertension Holding antihypertensives at this time to allow permissive hypertension as above.  Chronic HFpEF Last echo done in June 2023 showing EF 55 to 60%, trivial mitral regurgitation, and trivial aortic regurgitation.  No signs of volume overload at this time.  Repeat echo ordered given acute stroke as above.  DVT prophylaxis: SCDs Code Status: Full Code (discussed with the patient) Family Communication: No family available at this time. Consults called: Neurology Level of care: Telemetry bed Admission status: It is my clinical opinion that admission to INPATIENT is reasonable and necessary because of the expectation that this patient will require hospital care  that crosses at least 2 midnights to treat this condition based on the medical complexity of the problems presented.  Given the aforementioned information, the predictability of an adverse outcome is felt to be significant.  Editha Ram MD [facility]  If 7PM-7AM, please contact night-coverage [email or web address]  11/02/2023, 8:46 PM

## 2023-11-02 NOTE — ED Notes (Signed)
 Patient brought in as code stroke, per neurology code stroke cancelled. Patient very upset and irate in room.

## 2023-11-03 ENCOUNTER — Inpatient Hospital Stay (HOSPITAL_COMMUNITY)

## 2023-11-03 ENCOUNTER — Encounter: Payer: Self-pay | Admitting: Cardiovascular Disease

## 2023-11-03 ENCOUNTER — Encounter (HOSPITAL_COMMUNITY)

## 2023-11-03 DIAGNOSIS — I6389 Other cerebral infarction: Secondary | ICD-10-CM | POA: Diagnosis not present

## 2023-11-03 DIAGNOSIS — Z7902 Long term (current) use of antithrombotics/antiplatelets: Secondary | ICD-10-CM

## 2023-11-03 DIAGNOSIS — M7989 Other specified soft tissue disorders: Secondary | ICD-10-CM

## 2023-11-03 DIAGNOSIS — Z7982 Long term (current) use of aspirin: Secondary | ICD-10-CM

## 2023-11-03 DIAGNOSIS — R29702 NIHSS score 2: Secondary | ICD-10-CM

## 2023-11-03 DIAGNOSIS — I739 Peripheral vascular disease, unspecified: Secondary | ICD-10-CM

## 2023-11-03 DIAGNOSIS — E785 Hyperlipidemia, unspecified: Secondary | ICD-10-CM

## 2023-11-03 DIAGNOSIS — I639 Cerebral infarction, unspecified: Secondary | ICD-10-CM | POA: Diagnosis not present

## 2023-11-03 LAB — LIPID PANEL
Cholesterol: 174 mg/dL (ref 0–200)
HDL: 36 mg/dL — ABNORMAL LOW (ref >40)
LDL Cholesterol: 122 mg/dL — ABNORMAL HIGH (ref 0–99)
Total CHOL/HDL Ratio: 4.8 ratio
Triglycerides: 80 mg/dL (ref <150)
VLDL: 16 mg/dL (ref 0–40)

## 2023-11-03 LAB — ECHOCARDIOGRAM COMPLETE
AR max vel: 2.02 cm2
AV Peak grad: 5.4 mmHg
Ao pk vel: 1.16 m/s
Area-P 1/2: 2.91 cm2
Height: 66 in
S' Lateral: 2.2 cm
Weight: 2560 [oz_av]

## 2023-11-03 LAB — HEMOGLOBIN A1C
Hgb A1c MFr Bld: 5.2 % (ref 4.8–5.6)
Mean Plasma Glucose: 102.54 mg/dL

## 2023-11-03 MED ORDER — LORAZEPAM 2 MG/ML IJ SOLN: 2.0000 mg | Freq: Once | INTRAMUSCULAR | Status: DC

## 2023-11-03 MED ORDER — ROSUVASTATIN CALCIUM 20 MG PO TABS
20.0000 mg | ORAL_TABLET | Freq: Every day | ORAL | Status: DC
  Administered 2023-11-03 – 2023-11-04 (×2): 20 mg via ORAL
  Filled 2023-11-03 (×2): qty 1

## 2023-11-03 MED ORDER — ASPIRIN 81 MG PO TBEC
81.0000 mg | DELAYED_RELEASE_TABLET | Freq: Every day | ORAL | Status: DC
Start: 2023-11-04 — End: 2023-11-04
  Administered 2023-11-04: 81 mg via ORAL
  Filled 2023-11-03: qty 1

## 2023-11-03 MED ORDER — IOHEXOL 350 MG/ML SOLN
75.0000 mL | Freq: Once | INTRAVENOUS | Status: AC | PRN
  Administered 2023-11-03: 75 mL via INTRAVENOUS

## 2023-11-03 MED ORDER — ASPIRIN 325 MG PO TBEC
325.0000 mg | DELAYED_RELEASE_TABLET | Freq: Once | ORAL | Status: AC
  Administered 2023-11-03: 325 mg via ORAL
  Filled 2023-11-03: qty 1

## 2023-11-03 MED ORDER — CARVEDILOL 6.25 MG PO TABS
6.2500 mg | ORAL_TABLET | Freq: Two times a day (BID) | ORAL | Status: DC
  Administered 2023-11-03 – 2023-11-04 (×3): 6.25 mg via ORAL
  Filled 2023-11-03: qty 1
  Filled 2023-11-03: qty 2
  Filled 2023-11-03: qty 1

## 2023-11-03 MED ORDER — CLOPIDOGREL BISULFATE 75 MG PO TABS
75.0000 mg | ORAL_TABLET | Freq: Every day | ORAL | Status: DC
  Administered 2023-11-03 – 2023-11-04 (×2): 75 mg via ORAL
  Filled 2023-11-03 (×2): qty 1

## 2023-11-03 NOTE — ED Notes (Signed)
 Breakfast tray ordered

## 2023-11-03 NOTE — Progress Notes (Signed)
 PT Cancellation Note  Patient Details Name: [name] MRN: [id number] DOB: September 17, 1940   Cancelled Treatment:    Reason Eval/Treat Not Completed: Patient at procedure or test/unavailable. Pt at test.   Rodgers ORN [facility] 11/03/2023, 9:22 AM Rodgers Opal PT Acute Rehabilitation Services Office [phone]

## 2023-11-03 NOTE — Progress Notes (Signed)
 Echocardiogram 2D Echocardiogram has been performed.  [name] 11/03/2023, 9:30 AM

## 2023-11-03 NOTE — Progress Notes (Signed)
 [facility]   ROUNDING   NOTE Mazin Emma FMW:[id number]  DOB: 26-Oct-1940  DOA: 11/02/2023  PCP: Purcell [name], MD  11/03/2023,7:15 AM  LOS: 1 day    Code Status:  full     From:  home    83 year old Spanish-speaking male from home CABG X4 2019-?  Pericardial mass previously-admission 08/09/2018 22-6 22 2022 low risk Lexiscan  treated for GERD MRI heart done subsequently 08/30/2020 showed complex pericardial confluence on follow-up Dr. Wonda saw the patient PET scan eventually = hypermetabolic soft tissue thickening with no mass effect under observation by him Previous PE 11/04/2017 admission-no DVT?  False positive test per attending physician at that time Polycythemia vera Previous CVA 05/06/2017 with ischemic CVA Previous presyncope presumed secondary to orthostasis  9/13 presented to slumped over from church facial droop dysarthria chest pain Code stroke canceled-further history reveals out of multiple meds Crestor  Plavix  Lasix  and has been out for month Patient able to speak to me in clear English does not need translator tells me he slumped over drooping of the mouth on the right side does not recall that he has been admitted previously for previous presyncope back in 04/2012 when he was admitted for some hypertensive emergency and chest pain  Labs Sodium 136 potassium 3.7 BUN/creatinine 9/1.1 LFTs normal WBC 9 hemoglobin 13 platelet 234 A1c 5.2 LDL 122 HDL 36  Imaging CXR no active disease scarring at left base MR brain acute 10 mm nonhemorrhagic left right putamina nucleus infarct, advanced confluent periventricular subcortical white matter changes-punctate foci of susceptibility in thalami cerebellum brainstem microhemorrhages?  Vasculitis Remote lacunar infarcts bilateral thalami paramedian pons and left cerebellum  Neurology saw patient Echo pending   Assessment  & Plan :    Nonhemorrhagic left right putamen nucleus infarct Discussed with neurology-unlikely to be causing  his symptoms Will get orthostatic vital signs given history of similar situation 2014 Will rule out other causes such as PE with duplex lower extremity, CT chest as patient noncompliant on meds and does not seem to know what meds there are that he takes Medication noncompliance Known history CABG 2019 Complex pericardial confluence follows with Dr. Drinda Holding amlodipine  5 (not taking) resume Coreg  at 6.25 dosing instead of 12.5, hold statin for now, hold Diovan  for now We will try to characterize what is going on with his pericardial confluence I am not sure if that had any bearing on his presentation-this has been noted by Dr. Wonda in the past to be benign with a PET scan previously-see his notes False positive previous PE Documentation notes that in 2019 he had a false positive concern on CT chest Repeating CT chest stat given history of polycythemia vera and his presenting history with syncope Echocardiogram pending EKG my over read shows no sinus tach so unlikely to be arrhythmia/PE would present with sinus tach in some cases-given we need to rule out recurrence and the prior study was deemed as a false positive instead of dimer proceed straight to CT chest  Polycythemia vera h/o Raises risk of the above Previous syncope See discussion    Data Reviewed:   No labs today  DVT prophylaxis: Lovenox   Status is: Inpatient Remains inpatient appropriate because:   Await further data And then we will call son Gib [phone] once we have more info   Current Dispo: Inpatient     Subjective:    Looks well feels fair able to recount history (as above) pretty well no chest pain no fever no chills currently  I asked him about chest pain several times Seems close to normal cannot tell me date time year Asks me if he can eat    Objective + exam Vitals:   11/02/23 2130 11/02/23 2300 11/03/23 0100 11/03/23 0446  BP:  (!) 146/79 (!) 145/82 (!) 148/80  Pulse:  (!) 56  65 60  Resp:  19 17 20   Temp: 98.5 F (36.9 C)  98.5 F (36.9 C) 97.6 F (36.4 C)  TempSrc:   Oral Oral  SpO2:  99% 97% 99%  Weight:      Height:       Filed Weights   11/02/23 1311  Weight: 72.6 kg     Examination: Coherent awake alert poor dentition no JVD no icterus no pallor Neck soft supple No thyromegaly Multiple SKs over back Chest is clear Abdomen soft no rebound no guarding Sinus rhythm-monitors reviewed No lower extremity edema Power 5/5   Scheduled Meds:   stroke: early stages of recovery book   Does not apply Once    stroke: early stages of recovery book   Does not apply Once   carvedilol   6.25 mg Oral BID   Continuous Infusions:  Time 28  Colen Grimes, MD  [facility]

## 2023-11-03 NOTE — Progress Notes (Addendum)
 STROKE TEAM PROGRESS NOTE    SIGNIFICANT HOSPITAL EVENTS  9/13: presented post-syncope with dysarthria and chest pain.  MRI: right putamen nucleus acute infarct, multiple chronic infarcts and micro hemorrhages suggesting vasculitis  INTERIM HISTORY/SUBJECTIVE  Patient sitting up on ED stretcher. Disoriented to month. Follows commands, MAEs. Some subjective sensory loss to left leg.  Somewhat fidgety and anxious, but also seems that he just wants to get out of the room and walk around some.   OBJECTIVE  CBC    Component Value Date/Time   WBC 9.0 11/02/2023 1243   RBC 4.75 11/02/2023 1243   HGB 13.6 11/02/2023 1248   HGB 14.8 02/27/2022 0954   HCT 40.0 11/02/2023 1248   HCT 44.7 02/27/2022 0954   PLT 234 11/02/2023 1243   PLT 244 02/27/2022 0954   MCV 88.0 11/02/2023 1243   MCV 88 02/27/2022 0954   MCH 28.6 11/02/2023 1243   MCHC 32.5 11/02/2023 1243   RDW 14.0 11/02/2023 1243   RDW 14.8 02/27/2022 0954   LYMPHSABS 1.9 11/02/2023 1243   LYMPHSABS 2.5 05/25/2020 1607   MONOABS 0.4 11/02/2023 1243   EOSABS 0.4 11/02/2023 1243   EOSABS 0.5 (H) 05/25/2020 1607   BASOSABS 0.1 11/02/2023 1243   BASOSABS 0.1 05/25/2020 1607    BMET    Component Value Date/Time   NA 139 11/02/2023 1248   NA 143 02/27/2022 0954   K 3.7 11/02/2023 1248   CL 102 11/02/2023 1248   CO2 24 11/02/2023 1243   GLUCOSE 130 (H) 11/02/2023 1248   BUN 9 11/02/2023 1248   BUN 15 02/27/2022 0954   CREATININE 1.10 11/02/2023 1248   CREATININE 0.95 01/20/2016 1047   CALCIUM  8.9 11/02/2023 1243   EGFR 88 02/27/2022 0954   GFRNONAA >60 11/02/2023 1243    IMAGING past 24 hours ECHOCARDIOGRAM COMPLETE Result Date: 11/03/2023    ECHOCARDIOGRAM REPORT   Patient Name:   [name] Date of Exam: 11/03/2023 Medical Rec #:  [id number]        Height:       66.0 in Accession #:    [id number]       Weight:       160.0 lb Date of Birth:  1940/03/12        BSA:          1.819 m Patient Age:    83 years          BP:           149/97 mmHg Patient Gender: M                HR:           57 bpm. Exam Location:  Inpatient Procedure: 2D Echo, Cardiac Doppler and Color Doppler (Both Spectral and Color            Flow Doppler were utilized during procedure). Indications:    Stroke I63.9  History:        Patient has prior history of Echocardiogram examinations, most                 recent 08/09/2021. CHF, CAD, Stroke, Signs/Symptoms:Chest Pain                 and Syncope; Risk Factors:Hypertension and Dyslipidemia.  Sonographer:    Thea Norlander RCS Referring Phys: ARY CUMMINS IMPRESSIONS  1. Left ventricular ejection fraction, by estimation, is 60 to 65%. The left ventricle has normal function. The left ventricle has no regional wall  motion abnormalities. Left ventricular diastolic parameters are consistent with Grade I diastolic dysfunction (impaired relaxation).  2. Right ventricular systolic function is normal. The right ventricular size is normal. Tricuspid regurgitation signal is inadequate for assessing PA pressure.  3. The mitral valve is normal in structure. No evidence of mitral valve regurgitation. No evidence of mitral stenosis.  4. The aortic valve is tricuspid. Aortic valve regurgitation is trivial. Aortic valve sclerosis/calcification is present, without any evidence of aortic stenosis. Aortic valve Vmax measures 1.16 m/s.  5. The inferior vena cava is normal in size with greater than 50% respiratory variability, suggesting right atrial pressure of 3 mmHg. FINDINGS  Left Ventricle: Left ventricular ejection fraction, by estimation, is 60 to 65%. The left ventricle has normal function. The left ventricle has no regional wall motion abnormalities. The left ventricular internal cavity size was normal in size. There is  no left ventricular hypertrophy. Left ventricular diastolic parameters are consistent with Grade I diastolic dysfunction (impaired relaxation). Normal left ventricular filling pressure. Right Ventricle:  The right ventricular size is normal. No increase in right ventricular wall thickness. Right ventricular systolic function is normal. Tricuspid regurgitation signal is inadequate for assessing PA pressure. Left Atrium: Left atrial size was normal in size. Right Atrium: Right atrial size was normal in size. Pericardium: There is no evidence of pericardial effusion. Mitral Valve: The mitral valve is normal in structure. Mild to moderate mitral annular calcification. No evidence of mitral valve regurgitation. No evidence of mitral valve stenosis. Tricuspid Valve: The tricuspid valve is normal in structure. Tricuspid valve regurgitation is trivial. No evidence of tricuspid stenosis. Aortic Valve: The aortic valve is tricuspid. Aortic valve regurgitation is trivial. Aortic valve sclerosis/calcification is present, without any evidence of aortic stenosis. Aortic valve peak gradient measures 5.4 mmHg. Pulmonic Valve: The pulmonic valve was normal in structure. Pulmonic valve regurgitation is not visualized. No evidence of pulmonic stenosis. Aorta: The aortic root is normal in size and structure. Venous: The inferior vena cava is normal in size with greater than 50% respiratory variability, suggesting right atrial pressure of 3 mmHg. IAS/Shunts: No atrial level shunt detected by color flow Doppler.  LEFT VENTRICLE PLAX 2D LVIDd:         3.20 cm   Diastology LVIDs:         2.20 cm   LV e' medial:    6.74 cm/s LV PW:         1.00 cm   LV E/e' medial:  11.7 LV IVS:        1.10 cm   LV e' lateral:   6.20 cm/s LVOT diam:     2.00 cm   LV E/e' lateral: 12.8 LV SV:         55 LV SV Index:   30 LVOT Area:     3.14 cm  RIGHT VENTRICLE             IVC RV S prime:     12.90 cm/s  IVC diam: 1.50 cm TAPSE (M-mode): 1.1 cm LEFT ATRIUM             Index        RIGHT ATRIUM           Index LA diam:        4.20 cm 2.31 cm/m   RA Area:     15.10 cm LA Vol (A2C):   45.1 ml 24.80 ml/m  RA Volume:   31.50 ml  17.32 ml/m LA Vol (  A4C):    29.5 ml 16.22 ml/m LA Biplane Vol: 38.5 ml 21.17 ml/m  AORTIC VALVE AV Area (Vmax): 2.02 cm AV Vmax:        115.70 cm/s AV Peak Grad:   5.4 mmHg LVOT Vmax:      74.40 cm/s LVOT Vmean:     49.800 cm/s LVOT VTI:       0.174 m  AORTA Ao Root diam: 3.50 cm Ao Asc diam:  3.50 cm MITRAL VALVE MV Area (PHT): 2.91 cm     SHUNTS MV Decel Time: 261 msec     Systemic VTI:  0.17 m MV E velocity: 79.10 cm/s   Systemic Diam: 2.00 cm MV A velocity: 103.00 cm/s MV E/A ratio:  0.77 Wilbert Bihari MD Electronically signed by Wilbert Bihari MD Signature Date/Time: 11/03/2023/11:23:14 AM    Final    VAS US  LOWER EXTREMITY VENOUS (DVT) Result Date: 11/03/2023  Lower Venous DVT Study Patient Name:  [name]  Date of Exam:   11/03/2023 Medical Rec #: [id number]         Accession #:    [id number] Date of Birth: Oct 19, 1940         Patient Gender: M Patient Age:   21 years Exam Location:  [facility] Procedure:      VAS US  LOWER EXTREMITY VENOUS (DVT) Referring Phys: COLEN GRIMES --------------------------------------------------------------------------------  Indications: Stroke, and Syncope, chest pain.  Risk Factors: Remote PE. Comparison Study: Prior negative bilateral LEV done 11/05/2017 Performing Technologist: Alberta Lis RVS  Examination Guidelines: A complete evaluation includes B-mode imaging, spectral Doppler, color Doppler, and power Doppler as needed of all accessible portions of each vessel. Bilateral testing is considered an integral part of a complete examination. Limited examinations for reoccurring indications may be performed as noted. The reflux portion of the exam is performed with the patient in reverse Trendelenburg.  +---------+---------------+---------+-----------+----------+--------------+ RIGHT    CompressibilityPhasicitySpontaneityPropertiesThrombus Aging +---------+---------------+---------+-----------+----------+--------------+ CFV      Full           Yes      Yes                                  +---------+---------------+---------+-----------+----------+--------------+ SFJ      Full                                                        +---------+---------------+---------+-----------+----------+--------------+ FV Prox  Full           Yes      No                   Rouleaux flow  +---------+---------------+---------+-----------+----------+--------------+ FV Mid   Full                                                        +---------+---------------+---------+-----------+----------+--------------+ FV DistalFull                                                        +---------+---------------+---------+-----------+----------+--------------+  PFV      Full           Yes      No                                  +---------+---------------+---------+-----------+----------+--------------+ POP      Full           Yes      No                                  +---------+---------------+---------+-----------+----------+--------------+ PTV      Full                                                        +---------+---------------+---------+-----------+----------+--------------+ PERO     Full                                                        +---------+---------------+---------+-----------+----------+--------------+ Gastroc  Full                                                        +---------+---------------+---------+-----------+----------+--------------+   +---------+---------------+---------+-----------+----------+--------------+ LEFT     CompressibilityPhasicitySpontaneityPropertiesThrombus Aging +---------+---------------+---------+-----------+----------+--------------+ CFV      Full           Yes      Yes                                 +---------+---------------+---------+-----------+----------+--------------+ SFJ      Full                                                         +---------+---------------+---------+-----------+----------+--------------+ FV Prox  Full           Yes      No                                  +---------+---------------+---------+-----------+----------+--------------+ FV Mid   Full                                                        +---------+---------------+---------+-----------+----------+--------------+ FV DistalFull                                                        +---------+---------------+---------+-----------+----------+--------------+  PFV      Full           Yes      No                                  +---------+---------------+---------+-----------+----------+--------------+ POP      Full           Yes      No                   Rouleaux flow  +---------+---------------+---------+-----------+----------+--------------+ PTV      Full                                                        +---------+---------------+---------+-----------+----------+--------------+ PERO     Full                                                        +---------+---------------+---------+-----------+----------+--------------+ Gastroc  Full                                                        +---------+---------------+---------+-----------+----------+--------------+    Summary: RIGHT: - There is no evidence of deep vein thrombosis in the lower extremity.  - No cystic structure found in the popliteal fossa. Rouleaux flow noted  LEFT: - There is no evidence of deep vein thrombosis in the lower extremity.  - No cystic structure found in the popliteal fossa. Rouleaux flow noted.  *See table(s) above for measurements and observations.    Preliminary    CT Angio Chest Pulmonary Embolism (PE) W or WO Contrast Result Date: 11/03/2023 CLINICAL DATA:  83 year old male code stroke presentation yesterday. Right basal ganglia lacunar infarct. Bilateral lower extremity swelling. EXAM: CT ANGIOGRAPHY CHEST WITH CONTRAST  TECHNIQUE: Multidetector CT imaging of the chest was performed using the standard protocol during bolus administration of intravenous contrast. Multiplanar CT image reconstructions and MIPs were obtained to evaluate the vascular anatomy. RADIATION DOSE REDUCTION: This exam was performed according to the departmental dose-optimization program which includes automated exposure control, adjustment of the mA and/or kV according to patient size and/or use of iterative reconstruction technique. CONTRAST:  75mL OMNIPAQUE  IOHEXOL  350 MG/ML SOLN COMPARISON:  CTA head and neck yesterday.  CTA chest 12/27/2021. FINDINGS: Cardiovascular: Excellent contrast bolus timing in the pulmonary arterial tree. No saddle or lobar pulmonary artery filling defect. Right lower lobe segmental or subsegmental chronic filling defect on series 7, image 235, same branch was affected on 2023 CTA. This appears stable and nonocclusive. No acute or additional pulmonary artery filling defect identified. Heart size stable since 2023. No pericardial effusion. Prior CABG. Calcified aortic atherosclerosis. No contrast in the aorta. Mediastinum/Nodes: Chronic moderate to large gastric hiatal hernia. No superimposed mediastinal mass or lymphadenopathy. Lungs/Pleura: Mildly lower lung volumes compared to 2023. Major airways are patent. Increased but fairly symmetric bilateral lower lung atelectasis, slightly greater on the left related to hiatal hernia.  No pleural effusion. No consolidation or convincing active lung inflammation. Upper Abdomen: Vicarious contrast excretion in the gallbladder. Negative visible mostly noncontrast liver, spleen, pancreas, adrenal glands, bowel in the upper abdomen. Faint contrast excretion from both kidneys to nondilated collecting systems. Musculoskeletal: Healed sternotomy. No acute osseous abnormality identified. Review of the MIP images confirms the above findings. IMPRESSION: 1. Negative for acute pulmonary embolus.  Positive for a small chronic right lower lobe PE which appears nonocclusive and stable since 2023. 2. Moderate to large gastric hiatal hernia.  Lung base atelectasis. 3.  Aortic Atherosclerosis (ICD10-I70.0).  CABG. Electronically Signed   By: VEAR Hurst M.D.   On: 11/03/2023 10:38   MR BRAIN WO CONTRAST Result Date: 11/02/2023 EXAM: MRI BRAIN WITHOUT CONTRAST 11/02/2023 02:24:40 PM TECHNIQUE: Multiplanar multisequence MRI of the head/brain was performed without the administration of intravenous contrast. COMPARISON: CT head and CTA head and neck 11/02/2023 CLINICAL HISTORY: Neuro deficit, acute, stroke suspected. FINDINGS: BRAIN AND VENTRICLES: An acute 10 mm nonhemorrhagic infarct is present in the lateral right thalamus. T2 and FLAIR signal is associated. Remote lacunar infarcts are present within the thalami bilaterally. Remote lacunar infarcts are present in the right paramedian pons and within the left cerebellum. Confluent periventricular and subcortical white matter T2 signal changes are advanced for age. Multiple punctate foci of susceptibility are present within the thalami, cerebellum, and brainstem and to a lesser extent scattered in the subcortical white matter over the convexities. Prominent vascular spaces are present in the basal ganglia. ORBITS: No acute abnormality. SINUSES AND MASTOIDS: Polyps are present within the nasal cavity. Mild mucosal thickening is present in the left greater than right maxillary sinus. Scattered mucosal thickening is present throughout the ethmoid air cells and sphenoid sinuses. The right frontal sinus is opacified. BONES AND SOFT TISSUES: Normal marrow signal. No acute soft tissue abnormality. IMPRESSION: 1. Acute 10 mm nonhemorrhagic infarct in the lateral right putamen nucleus with associated T2 and FLAIR signal. 2. Advanced confluent periventricular and subcortical white matter T2 signal changes for age. 3. Multiple punctate foci of susceptibility within the thalami,  cerebellum, brainstem, and scattered in the subcortical white matter over the convexities. This is consistent with microhemorrhages and suggests underlying vasculitis 4. Remote lacunar infarcts in the thalami bilaterally, right paramedian pons, and left cerebellum. The pertinent results were texted to Dr. Vanessa via the [facility]: Lambert Rhonda W system at 3:32 pm. Electronically signed by: Lonni Necessary MD 11/02/2023 03:35 PM EDT RP Workstation: [id number]   CT ANGIO HEAD NECK W WO CM (CODE STROKE) Result Date: 11/02/2023 CLINICAL DATA:  83 year old male code stroke presentation, aphasia. EXAM: CT ANGIOGRAPHY HEAD AND NECK TECHNIQUE: Multidetector CT imaging of the head and neck was performed using the standard protocol during bolus administration of intravenous contrast. Multiplanar CT image reconstructions and MIPs were obtained to evaluate the vascular anatomy. Carotid stenosis measurements (when applicable) are obtained utilizing NASCET criteria, using the distal internal carotid diameter as the denominator. RADIATION DOSE REDUCTION: This exam was performed according to the departmental dose-optimization program which includes automated exposure control, adjustment of the mA and/or kV according to patient size and/or use of iterative reconstruction technique. CONTRAST:  75mL OMNIPAQUE  IOHEXOL  350 MG/ML SOLN COMPARISON:  Head CT 1250 hours today. CTA head and neck 05/02/2016. FINDINGS: CTA NECK Skeleton: Sternotomy. Ordinary cervical spine degeneration. No acute osseous abnormality identified. Upper chest: Sternotomy may be new since 2018. Evidence of CABG. Visible central pulmonary arteries appear patent, enhancing. Mild upper lung atelectasis. Other neck: Nonvascular neck soft tissue  spaces appear stable since 2018. Small chronic retention cyst of the right epiglottic fold suspected (series 7, image 106), stable. No acute or suspicious finding identified. Aortic arch: Calcified aortic atherosclerosis.  3 vessel arch.  Right carotid system: Brachiocephalic artery tortuosity and atherosclerosis without stenosis. Mildly tortuous proximal right CCA. Patent right carotid bifurcation with mild plaque. No significant stenosis to the skull base. Left carotid system: Left CCA origin plaque without stenosis. Tortuous left CCA. Mild plaque from the left carotid bifurcation through the left ICA bulb without stenosis. Vertebral arteries: Soft plaque proximal right subclavian artery is mild without stenosis. Mild calcified plaque at the right vertebral artery origin without stenosis. Non dominant right vertebral artery has a stable caliber since 2018, remains diminutive but patent to the skull base. No stenosis in the neck. Proximal left subclavian artery soft calcified plaque without stenosis. Mild tortuosity. Calcified plaque at the left vertebral origin. No significant stenosis. Dominant left vertebral artery, tortuous V1 segment. No other left vertebral plaque or stenosis to the skull base. CTA HEAD Posterior circulation: Dominant left vertebral artery with normal PICA origins primarily supplies the basilar without plaque or stenosis. Right V4 segment is diminutive but remains patent to the vertebrobasilar junction. Right AICA appears to be dominant. Patent basilar artery with mild irregularity. No basilar stenosis. Patent SCA and PCA origins. Posterior communicating arteries are diminutive or absent. Stable basilar tip since 2018. Chronic bilateral PCA atherosclerosis and irregularity. Bilateral PCA branches remain patent. Up to moderate left P2 irregularity, mild-to-moderate stenosis does appear increased since 2018 on series 605, image 45. Contralateral mild right P2 irregularity appears stable. Anterior circulation: Both ICA siphons are patent. Bulky left siphon cavernous and proximal supraclinoid segment calcified plaque. And moderate distal cavernous and anterior genu left ICA stenosis appears increased since 2018. Contralateral  right siphon moderate calcified plaque with mild cavernous but up to moderate supraclinoid right ICA stenosis (series 5, image 104) appears stable since 2018. Carotid termini are patent. MCA and ACA origins are patent. Dominant left A1 again noted. Normal anterior communicating artery. Bilateral ACA branches are patent. But moderate left ACA A2 irregularity and stenosis has increased since 2018 on series 9, image 39. Left MCA M1 segment and bifurcation are patent without stenosis. Right MCA M1 segment and trifurcation are patent with mild irregularity, no stenosis. Bilateral MCA branches are stable. No significant branch stenosis identified. Venous sinuses: Early contrast timing, not evaluated. Anatomic variants: Dominant left vertebral artery, right is diminutive. Dominant left ACA A1. Review of the MIP images confirms the above findings IMPRESSION: 1. Negative for large vessel occlusion. 2. Positive for abundant atherosclerosis in the head and neck. Notable and/or increased since 2018 CTA stenoses: - Moderate LICA siphon stenosis appears progressed since 2018. Moderate Right siphon stenosis appears stable. - Left ACA A2 segment Moderate stenosis is increased. - Moderate Left PCA P2 stenosis appears increased 3. No significant extracranial stenosis. 4.  Aortic Atherosclerosis (ICD10-I70.0). Electronically Signed   By: VEAR Hurst M.D.   On: 11/02/2023 13:16   CT HEAD CODE STROKE WO CONTRAST Result Date: 11/02/2023 CLINICAL DATA:  Code stroke.  83 year old male.  Aphasia. EXAM: CT HEAD WITHOUT CONTRAST TECHNIQUE: Contiguous axial images were obtained from the base of the skull through the vertex without intravenous contrast. RADIATION DOSE REDUCTION: This exam was performed according to the departmental dose-optimization program which includes automated exposure control, adjustment of the mA and/or kV according to patient size and/or use of iterative reconstruction technique. COMPARISON:  Brain MRI 11/04/2017.  Head  CT 08/08/2020. FINDINGS: Brain: Stable cerebral volume. No ventriculomegaly. No midline shift, mass effect, or evidence of intracranial mass lesion. No acute intracranial hemorrhage identified. Advanced chronic small vessel disease. Confluent, widespread bilateral cerebral white matter hypodensity and extensive heterogeneity in the bilateral deep gray nuclei. No significant change from the 2022 CT. Posterior fossa appears relatively spared. No cortically based acute infarct identified. Vascular: Calcified atherosclerosis at the skull base. No suspicious intracranial vascular hyperdensity. Skull: Stable and intact. Sinuses/Orbits: Chronic sinonasal polyposis, stable since 2022. Areas of associated chronic paranasal sinus periosteal thickening. Tympanic cavities and mastoids remain well aerated. Other: No gaze deviation. Visualized orbits and scalp soft tissues are within normal limits. ASPECTS [facility] Stroke Program Early CT Score) Total score (0-10 with 10 being normal): 10 IMPRESSION: 1. No acute cortically based infarct or acute intracranial hemorrhage identified. ASPECTS 10. 2. Advanced chronic small vessel disease appears stable by CT since 2022. 3. Chronic sinonasal polyposis. #1 and #2 communicated to Dr. Khaliqdina at 12:59 pm on 11/02/2023 by text page via the [facility] messaging system. Electronically Signed   By: VEAR Hurst M.D.   On: 11/02/2023 13:00    Vitals:   11/03/23 0100 11/03/23 0446 11/03/23 0700 11/03/23 0800  BP: (!) 145/82 (!) 148/80 (!) 149/97 (!) 164/86  Pulse: 65 60 (!) 58 (!) 58  Resp: 17 20 17 19   Temp: 98.5 F (36.9 C) 97.6 F (36.4 C)    TempSrc: Oral Oral    SpO2: 97% 99% 98% 100%  Weight:      Height:         PHYSICAL EXAM General:  Alert, well-nourished, well-developed patient in no acute distress Psych:  somewhat fidgety and anxious CV: Regular rate and rhythm on monitor Respiratory:  Regular, unlabored respirations on room air   NEURO:  Mental Status: AA.  Disoriented to month. Patient is able to give clear and coherent history Speech/Language: speech is without dysarthria or aphasia.  Naming, repetition, fluency, and comprehension intact.  Cranial Nerves:  II: PERRL. Visual fields full.  III, IV, VI: EOMI. Eyelids elevate symmetrically.  V: Sensation is intact to light touch and symmetrical to face.  VII: Face is symmetrical resting and smiling VIII: hearing intact to voice. IX, X: Palate elevates symmetrically. Phonation is normal.  KP:Dynloizm shrug 5/5. XII: tongue is midline without fasciculations. Motor:  LLE: 4+/5, no drift Tone: is normal and bulk is normal Sensation-decreased to LLE Coordination: FTN intact bilaterally, HKS: no ataxia in BLE.No drift.  Gait- deferred  Most Recent NIH: 2     ASSESSMENT/PLAN  [name] is a 83 y.o. male with history of CAD with CABGx4, PE previously on eliquis  who presented post-syncope with dysarthria and chest pain. Imaging revealed right putamen nucleus acute infarct, multiple chronic infarcts and micro hemorrhages suggesting vasculitis, admitted for full stroke workup.  NIH on Admission 1.  Stroke:  right lateral putamen small infarct, etiology likely small vessel disease  Code Stroke CT head No acute abnormality. Small vessel disease. ASPECTS 10.    CTA head & neck Negative for large vessel occlusion. Moderate LICA siphon stenosis appears progressed since 2018. Moderate Right siphon stenosis appears stable. Left ACA A2 segment Moderate stenosis is increased. Moderate Left PCA P2 stenosis appears increased MRI  Acute 10 mm nonhemorrhagic infarct in the lateral right putamen nucleus with associated T2 and FLAIR signal. Remote lacunar infarcts in the thalami bilaterally, right paramedian pons, and left cerebellum. CT angio chest PE: Negative for acute PE.  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 CTA head and neck b/l siphon atherosclerosis. EF 60-65%. LDL 74 and A1C 5.7. Discharged on DAPT and zocor  20  ? Seizure like activity Per family report, pt was in church and had sudden onset slump to the left, staring off, not responding, b/l hand mildly shaking, lasted about several minutes. On EMS arrival, pt was able to speak but slurry and not able to recall what happened. In ER, was reported some confusion and agitation. Pt back to baseline about one hour. No hx of seizure EEG pending  No AEDs needed at this time Outpt follow up with neurology  Hypertension Home meds:  Coreg  12.5mg  BID, Norvasc  5mg  daily, Valsartan  160mg  daily BP Stable now Blood pressure goal normotensive. Avoid low BP  Hyperlipidemia Home meds: none LDL 122, goal < 70 Now on crestor  20 Continue statin at discharge  Other Stroke Risk Factors Advanced Age CAD s/p CABG x4, follows with Dr. Wonda cardiology Hx of PE 12/2021, now repeat CTA showed chronic PE, off eliquis   Other medical issue Arrhythmia on tele 9/14 - EKG stat pending  Hospital day # 1   Pt seen by Neuro NP/APP and later by MD. Note/plan to be edited by MD as needed.    Rocky JAYSON Likes, DNP, AGACNP-BC [facility] Please use AMION for contact information & EPIC for messaging.  ATTENDING NOTE: I reviewed above note and agree with the assessment and plan. Pt was seen and examined.   Son and wife are at the bedside. Pt now neuro intact but per family report, pt was in church and had sudden onset slump to the left, staring off, not responding, b/l hand mildly shaking, lasted about several minutes. On  EMS arrival, pt was able to speak but slurry and not able to recall what happened. In ER, was reported some confusion and agitation. Pt back to baseline about one hour. So far work up only showed small right lateral BG infarct, EEG pending. On DAPT and statin. No AEDs needed at this time. EKG repeat to rule out afib. Will follow.   For detailed assessment and plan, please refer to above as I have made changes wherever appropriate.   Ary Cummins, MD PhD Stroke Neurology 11/03/2023 3:52 PM     To contact Stroke Continuity provider, please refer to [email or web address]. After hours, contact General Neurology

## 2023-11-03 NOTE — Evaluation (Signed)
 Occupational Therapy Evaluation Patient Details Name: [name] MRN: [id number] DOB: August 06, 1940 Today's Date: 11/03/2023   History of Present Illness   Pt is 83 year old presented to [facility] on  11/02/23 for syncope episode with dysarthria. MRI showed acute nonhemorrhagic infarct in rt lateral putamen nucleus and findings consistent with microhemorrhages and suggesting underlying vasculitis. PMH - CAD, CABG, pericardial mass, recurrent presyncope/syncope, htn, chf, PE     Clinical Impressions PTA, pt lived with spouse and was independent in ADL with son who lives nearby assisting with IADL/transportation. Upon eval, pt needing up to CGA for functional mobility and BADL. Per son, pt has good support and is near his baseline. Pt oriented to day/month/year, location, general situation. Will follow acutely but do not anticipate need for follow up OT after discharge.    If plan is discharge home, recommend the following:   A little help with walking and/or transfers;A little help with bathing/dressing/bathroom;Assistance with cooking/housework;Assist for transportation;Help with stairs or ramp for entrance;Direct supervision/assist for financial management;Direct supervision/assist for medications management     Functional Status Assessment   Patient has had a recent decline in their functional status and demonstrates the ability to make significant improvements in function in a reasonable and predictable amount of time.     Equipment Recommendations   Tub/shower seat     Recommendations for Other Services         Precautions/Restrictions   Precautions Precautions: Fall Restrictions Weight Bearing Restrictions Per Provider Order: No     Mobility Bed Mobility               General bed mobility comments: standing next to bed with RN for orthostatics on arrival    Transfers Overall transfer level: Needs assistance Equipment used: None Transfers: Sit to/from  Stand Sit to Stand: Contact guard assist           General transfer comment: Assist for safety      Balance Overall balance assessment: Needs assistance Sitting-balance support: No upper extremity supported, Feet supported Sitting balance-Leahy Scale: Good     Standing balance support: No upper extremity supported, During functional activity Standing balance-Leahy Scale: Fair                             ADL either performed or assessed with clinical judgement   ADL Overall ADL's : Needs assistance/impaired Eating/Feeding: Modified independent;Sitting   Grooming: Contact guard assist;Standing   Upper Body Bathing: Set up;Sitting   Lower Body Bathing: Contact guard assist;Sit to/from stand   Upper Body Dressing : Set up;Sitting   Lower Body Dressing: Contact guard assist;Sitting/lateral leans;Sit to/from stand   Toilet Transfer: Contact guard assist;Ambulation [facility])           Functional mobility during ADLs: Contact guard assist;Cane       Vision Patient Visual Report: No change from baseline       Perception         Praxis         Pertinent Vitals/Pain Pain Assessment Pain Assessment: No/denies pain     Extremity/Trunk Assessment Upper Extremity Assessment Upper Extremity Assessment: Generalized weakness;Right hand dominant   Lower Extremity Assessment Lower Extremity Assessment: Defer to PT evaluation       Communication Communication Communication: No apparent difficulties   Cognition Arousal: Alert Behavior During Therapy: WFL for tasks assessed/performed Cognition: Difficult to assess Difficult to assess due to: Non-English speaking  Following commands: Intact       Cueing  General Comments   Cueing Techniques: Verbal cues  orthostatics taken at beginning of session; noted 20 point drop after standing 3 mins. See flowsheets for more as RN initiated prior to arrival    Exercises     Shoulder Instructions      Home Living Family/patient expects to be discharged to:: Private residence Living Arrangements: Spouse/significant other;Children Available Help at Discharge: Family;Available 24 hours/day Type of Home: House Home Access: Stairs to enter [employer] of Steps: 2 Entrance Stairs-Rails: None Home Layout: One level     Bathroom Shower/Tub: Chief Strategy Officer: Standard     Home Equipment: Cane - single point          Prior Functioning/Environment Prior Level of Function : Independent/Modified Independent             Mobility Comments: Uses cane for amb ADLs Comments: pt family assists with home management; pt able to perform his own medication management, but forgets to tell son when he runs out    OT Problem List: Decreased strength;Decreased activity tolerance;Impaired balance (sitting and/or standing);Decreased cognition;Decreased knowledge of use of DME or AE   OT Treatment/Interventions: Self-care/ADL training;Therapeutic exercise;DME and/or AE instruction;Therapeutic activities;Cognitive remediation/compensation;Patient/family education;Balance training      OT Goals(Current goals can be found in the care plan section)   Acute Rehab OT Goals Patient Stated Goal: get better OT Goal Formulation: With patient Time For Goal Achievement: 11/17/23 Potential to Achieve Goals: Good   OT Frequency:  Min 2X/week    Co-evaluation              AM-PAC OT 6 Clicks Daily Activity     Outcome Measure Help from another person eating meals?: None Help from another person taking care of personal grooming?: A Little Help from another person toileting, which includes using toliet, bedpan, or urinal?: A Little Help from another person bathing (including washing, rinsing, drying)?: A Little Help from another person to put on and taking off regular upper body clothing?: A Little Help from another  person to put on and taking off regular lower body clothing?: A Little 6 Click Score: 19   End of Session Equipment Utilized During Treatment: Gait belt;Other (comment) [facility]) Nurse Communication: Mobility status;Other (comment) (handoff to transport team for floor/bed transfer)  Activity Tolerance: Patient tolerated treatment well Patient left: Other (comment) (in hall in transport chair with transport staff to go to new room; accompanied by family)  OT Visit Diagnosis: Unsteadiness on feet (R26.81);Muscle weakness (generalized) (M62.81);Other symptoms and signs involving cognitive function                Time: 8594-8581 OT Time Calculation (min): 13 min Charges:  OT General Charges $OT Visit: 1 Visit OT Evaluation $OT Eval Low Complexity: 1 Low  Elma JONETTA Lebron FREDERICK, OTR/L [facility] Acute Rehabilitation Office: [phone]   Elma JONETTA Lebron 11/03/2023, 3:09 PM

## 2023-11-03 NOTE — ED Notes (Addendum)
 Pt to vascular.

## 2023-11-03 NOTE — Progress Notes (Signed)
 EEG complete - results pending

## 2023-11-03 NOTE — Progress Notes (Signed)
 VASCULAR LAB    Bilateral lower extremity venous duplex has been performed.  See CV proc for preliminary results.   Pamla Pangle, RVT 11/03/2023, 9:54 AM

## 2023-11-03 NOTE — Evaluation (Signed)
 Physical Therapy Evaluation Patient Details Name: [name] MRN: [id number] DOB: [birth year] Today's Date: 11/03/2023  History of Present Illness  Pt is 83 year old presented to [facility] on  11/02/23 for syncope episode with dysarthria. MRI showed acute nonhemorrhagic infarct in rt lateral putamen nucleus and findings consistent with microhemorrhages and suggesting underlying vasculitis. PMH - CAD, CABG, pericardial mass, recurrent presyncope/syncope, htn, chf, PE  Clinical Impression  Pt presents to PT close to baseline with mobility. A little unsteady initially. Likely due to last 24 hours in bed. Uses cane at home and expect he will return to that baseline. Will follow while here but don't feel he will need any PT at DC.         If plan is discharge home, recommend the following: Help with stairs or ramp for entrance;Assist for transportation;Assistance with cooking/housework   Can travel by private vehicle        Equipment Recommendations None recommended by PT  Recommendations for Other Services       Functional Status Assessment Patient has had a recent decline in their functional status and demonstrates the ability to make significant improvements in function in a reasonable and predictable amount of time.     Precautions / Restrictions Precautions Precautions: Fall Restrictions Weight Bearing Restrictions Per Provider Order: No      Mobility  Bed Mobility Overal bed mobility: Modified Independent                  Transfers Overall transfer level: Needs assistance Equipment used: None Transfers: Sit to/from Stand Sit to Stand: Contact guard assist           General transfer comment: Assist for safety    Ambulation/Gait Ambulation/Gait assistance: Contact guard assist Gait Distance (Feet): 200 Feet Assistive device: 1 person hand held assist, None, Rollator (4 wheels) Gait Pattern/deviations: Step-through pattern, Decreased stride length Gait  velocity: decr Gait velocity interpretation: 1.31 - 2.62 ft/sec, indicative of limited community ambulator   General Gait Details: Began amb with HH support and pt reaching out with free hand for other support. Switched to rollator with pt displaying more confident gait. When returned to room pt able to amb in room with no rollator and intermittent support of furniture for UE.  Stairs            Wheelchair Mobility     Tilt Bed    Modified Rankin (Stroke Patients Only)       Balance Overall balance assessment: Needs assistance Sitting-balance support: No upper extremity supported, Feet supported Sitting balance-Leahy Scale: Good     Standing balance support: No upper extremity supported, During functional activity Standing balance-Leahy Scale: Fair                               Pertinent Vitals/Pain Pain Assessment Pain Assessment: No/denies pain    Home Living Family/patient expects to be discharged to:: Private residence Living Arrangements: Spouse/significant other;Children Available Help at Discharge: Family;Available 24 hours/day Type of Home: House Home Access: Stairs to enter Entrance Stairs-Rails: None Entrance Stairs-Number of Steps: 2   Home Layout: One level Home Equipment: Cane - single point      Prior Function Prior Level of Function : Independent/Modified Independent             Mobility Comments: Uses cane for amb       Extremity/Trunk Assessment   Upper Extremity Assessment Upper Extremity Assessment: Defer to  OT evaluation    Lower Extremity Assessment Lower Extremity Assessment: Generalized weakness       Communication   Communication Communication: No apparent difficulties    Cognition Arousal: Alert Behavior During Therapy: WFL for tasks assessed/performed   PT - Cognitive impairments: No apparent impairments                         Following commands: Intact       Cueing Cueing  Techniques: Verbal cues     General Comments General comments (skin integrity, edema, etc.): VSS on RA    Exercises     Assessment/Plan    PT Assessment Patient needs continued PT services  PT Problem List Decreased balance;Decreased mobility;Decreased strength       PT Treatment Interventions DME instruction;Stair training;Gait training;Functional mobility training;Therapeutic activities;Therapeutic exercise;Balance training;Patient/family education    PT Goals (Current goals can be found in the Care Plan section)  Acute Rehab PT Goals Patient Stated Goal: return home PT Goal Formulation: With patient Time For Goal Achievement: 11/17/23 Potential to Achieve Goals: Good    Frequency Min 3X/week     Co-evaluation               AM-PAC PT 6 Clicks Mobility  Outcome Measure Help needed turning from your back to your side while in a flat bed without using bedrails?: None Help needed moving from lying on your back to sitting on the side of a flat bed without using bedrails?: None Help needed moving to and from a bed to a chair (including a wheelchair)?: A Little Help needed standing up from a chair using your arms (e.g., wheelchair or bedside chair)?: A Little Help needed to walk in hospital room?: A Little Help needed climbing 3-5 steps with a railing? : A Little 6 Click Score: 20    End of Session Equipment Utilized During Treatment: Gait belt Activity Tolerance: Patient tolerated treatment well Patient left: in chair;with call bell/phone within reach Nurse Communication: Mobility status PT Visit Diagnosis: Unsteadiness on feet (R26.81);Muscle weakness (generalized) (M62.81)    Time: 8850-8796 PT Time Calculation (min) (ACUTE ONLY): 14 min   Charges:   PT Evaluation $PT Eval Low Complexity: 1 Low   PT General Charges $$ ACUTE PT VISIT: 1 Visit         [facility] PT Acute Rehabilitation Services Office [phone]   Rodgers ORN [facility] 11/03/2023,  1:37 PM

## 2023-11-04 ENCOUNTER — Other Ambulatory Visit: Payer: Self-pay | Admitting: Cardiology

## 2023-11-04 DIAGNOSIS — R569 Unspecified convulsions: Secondary | ICD-10-CM

## 2023-11-04 DIAGNOSIS — I10 Essential (primary) hypertension: Secondary | ICD-10-CM

## 2023-11-04 DIAGNOSIS — R55 Syncope and collapse: Secondary | ICD-10-CM | POA: Diagnosis not present

## 2023-11-04 DIAGNOSIS — I2584 Coronary atherosclerosis due to calcified coronary lesion: Secondary | ICD-10-CM

## 2023-11-04 DIAGNOSIS — I639 Cerebral infarction, unspecified: Secondary | ICD-10-CM | POA: Diagnosis not present

## 2023-11-04 DIAGNOSIS — I2510 Atherosclerotic heart disease of native coronary artery without angina pectoris: Secondary | ICD-10-CM | POA: Diagnosis not present

## 2023-11-04 MED ORDER — VALSARTAN 160 MG PO TABS: 160.0000 mg | ORAL_TABLET | Freq: Every day | ORAL | 2 refills | Status: DC

## 2023-11-04 MED ORDER — ASPIRIN 81 MG PO TBEC: 81.0000 mg | DELAYED_RELEASE_TABLET | Freq: Every day | ORAL | 0 refills | Status: AC

## 2023-11-04 MED ORDER — ROSUVASTATIN CALCIUM 40 MG PO TABS: 40.0000 mg | ORAL_TABLET | Freq: Every day | ORAL | 2 refills | Status: DC

## 2023-11-04 MED ORDER — CLOPIDOGREL BISULFATE 75 MG PO TABS: 75.0000 mg | ORAL_TABLET | Freq: Every day | ORAL | 3 refills | Status: AC

## 2023-11-04 MED ORDER — CARVEDILOL 6.25 MG PO TABS
6.2500 mg | ORAL_TABLET | Freq: Two times a day (BID) | ORAL | 2 refills | Status: DC
Start: 2023-11-04 — End: 2023-11-08

## 2023-11-04 NOTE — Plan of Care (Signed)

## 2023-11-04 NOTE — Procedures (Signed)
 Patient Name: [name]  MRN: [id number]  Epilepsy Attending: Arlin MALVA Krebs  Referring Physician/Provider: Jerri Pfeiffer, MD  Date: 11/03/2023 Duration: 22.25 mins  Patient history:  83 y.Gerald. male with history of CAD with CABGx4, PE previously on eliquis  who presented post-syncope. EEG to evaluate for seizure  Level of alertness: Awake  AEDs during EEG study: None  Technical aspects: This EEG study was done with scalp electrodes positioned according to the 10-20 International system of electrode placement. Electrical activity was reviewed with band pass filter of 1-70Hz , sensitivity of 7 uV/mm, display speed of 93mm/sec with a 60Hz  notched filter applied as appropriate. EEG data were recorded continuously and digitally stored.  Video monitoring was available and reviewed as appropriate.  Description: The posterior dominant rhythm consists of 8 Hz activity of moderate voltage (25-35 uV) seen predominantly in posterior head regions, symmetric and reactive to eye opening and eye closing. EEG showed intermittent generalized 3 to 6 Hz theta-delta slowing. Hyperventilation and photic stimulation were not performed.     ABNORMALITY - Intermittent slow, generalized  IMPRESSION: This study is suggestive of mild diffuse encephalopathy. No seizures or epileptiform discharges were seen throughout the recording.  [name] [name]

## 2023-11-04 NOTE — Discharge Summary (Signed)
 PATIENT DETAILS Name: [name] Age: 83 y.o. Sex: male Date of Birth: 1941-01-28 MRN: [id number]. Admitting Physician: Editha Ram, MD ERE:Djhjmipj, [name], MD  Admit Date: 11/02/2023 Discharge date: 11/04/2023  Recommendations for Outpatient Follow-up:  Follow up with PCP in 1-2 weeks Please obtain CMP/CBC in one week Please ensure follow-up with cardiology-needs 30-day event monitor.  Admitted From:  Home  Disposition: Home   Discharge Condition: good  CODE STATUS:   Code Status: Full Code   Diet recommendation:  Diet Order             Diet - low sodium heart healthy           Diet Heart Room service appropriate? Yes; Fluid consistency: Thin  Diet effective now                    Brief Summary: 83 year old with history of CAD s/p CABG, PE-completed a course of Eliquis  sometime in 2020 04/2022-who was at church when he sustained a what sounds like a syncopal episode without any prodrome.  He was brought to the ED-where further workup revealed acute CVA.  Brief Hospital Course: Acute right lateral predominant small ischemic infarct Likely secondary to small vessel disease CTA head/neck negative for LVO/significant stenosis-echo stable EF, LDL 122, A1c 5.2 Discussed with stroke MD-recommendations are for aspirin /Plavix  x 3 weeks then Plavix  alone  ?  Syncope Had a sudden onset of what sounds like loss of consciousness while at church without any prodrome.  Per patient-he has had several similar episodes of syncope in the past. Telemetry negative for arrhythmias Echo with stable EF CTA chest negative for acute PE-Dopplers negative for DVT Discussed with cardiology-Dr. Toney O'Neal-recommendations are to pursue outpatient cardiac monitoring, cardiology coordinator contacted via epic requesting 30-day cardiac monitoring followed by follow-up with primary cardiologist. Patient advised not to drive or operate heavy machinery. Note-EEG was negative.   Not felt to have seizures-no AEDs recommended by neurology.  HTN Resume antihypertensives on discharge  HLD Continue statin on discharge  History of chronic small right lower lobe PE since 2019 Repeat CTA chest shows similar small/tiny right lower lobe PE-reviewed records-he has had the same lesion since 2019-this could just be organized scar tissue rather than PE at this point.  Spoke with pulmonology-Dr. Rolan Smith-since he is essentially asymptomatic-Dopplers are negative-echo with stable RV function-not felt to need treatment for this.  Furthermore he has already completed approximately 6 months of treatment somewhere in 2020 04/2022.  CAD s/p CABG 2019 Without any anginal symptoms. On DAPT/statin/beta-blocker.  Chronic HFpEF Euvolemic  History of known pericardial mass Followed by Dr. Napoleon observation and follow-up with Dr. Wonda.  Discharge Diagnoses:  Principal Problem:   Acute CVA (cerebrovascular accident) [facility]) Active Problems:   CAD (coronary artery disease)   Essential hypertension   Syncope   Chest pain   Discharge Instructions:  Activity:  As tolerated with Full fall precautions use walker/cane & assistance as needed   Discharge Instructions     Ambulatory referral to Neurology   Complete by: As directed    An appointment is requested in approximately: 4 weeks   Call MD for:  difficulty breathing, headache or visual disturbances   Complete by: As directed    Diet - low sodium heart healthy   Complete by: As directed    Discharge instructions   Complete by: As directed    Follow with Primary MD  Purcell [name], MD in 1-2 weeks  Please do  not drive or operate heavy machinery-you have unexplained syncope, please talk to your primary care practitioner or primary cardiologist before attempting to drive.  Please get a complete blood count and chemistry panel checked by your Primary MD at your next visit, and again as instructed by your  Primary MD.  Get Medicines reviewed and adjusted: Please take all your medications with you for your next visit with your Primary MD  Laboratory/radiological data: Please request your Primary MD to go over all hospital tests and procedure/radiological results at the follow up, please ask your Primary MD to get all Hospital records sent to his/her office.  In some cases, they will be blood work, cultures and biopsy results pending at the time of your discharge. Please request that your primary care M.D. follows up on these results.  Also Note the following: If you experience worsening of your admission symptoms, develop shortness of breath, life threatening emergency, suicidal or homicidal thoughts you must seek medical attention immediately by calling 911 or calling your MD immediately  if symptoms less severe.  You must read complete instructions/literature along with all the possible adverse reactions/side effects for all the Medicines you take and that have been prescribed to you. Take any new Medicines after you have completely understood and accpet all the possible adverse reactions/side effects.   Do not drive when taking Pain medications or sleeping medications (Benzodaizepines)  Do not take more than prescribed Pain, Sleep and Anxiety Medications. It is not advisable to combine anxiety,sleep and pain medications without talking with your primary care practitioner  Special Instructions: If you have smoked or chewed Tobacco  in the last 2 yrs please stop smoking, stop any regular Alcohol  and or any Recreational drug use.  Wear Seat belts while driving.  Please note: You were cared for by a hospitalist during your hospital stay. Once you are discharged, your primary care physician will handle any further medical issues. Please note that NO REFILLS for any discharge medications will be authorized once you are discharged, as it is imperative that you return to your primary care physician  (or establish a relationship with a primary care physician if you do not have one) for your post hospital discharge needs so that they can reassess your need for medications and monitor your lab values.   Increase activity slowly   Complete by: As directed       Allergies as of 11/04/2023       Reactions   Lipitor [atorvastatin ] Shortness Of Breath, Other (See Comments)   Sores non head   Morphine  And Codeine Shortness Of Breath, Swelling, Rash   Throat swelling   Pork-derived Products Swelling, Rash   Tongue swelling No pork products -         Medication List     STOP taking these medications    acetaminophen  325 MG tablet Commonly known as: TYLENOL    amLODipine  5 MG tablet Commonly known as: NORVASC    apixaban  5 MG Tabs tablet Commonly known as: ELIQUIS        TAKE these medications    aspirin  EC 81 MG tablet Take 1 tablet (81 mg total) by mouth daily for 21 days. Swallow whole.   carvedilol  6.25 MG tablet Commonly known as: COREG  Take 1 tablet (6.25 mg total) by mouth 2 (two) times daily with a meal. What changed:  medication strength how much to take when to take this   clopidogrel  75 MG tablet Commonly known as: PLAVIX  Take 1 tablet (75  mg total) by mouth daily.   rosuvastatin  40 MG tablet Commonly known as: CRESTOR  Take 1 tablet (40 mg total) by mouth daily.   valsartan  160 MG tablet Commonly known as: Diovan  Take 1 tablet (160 mg total) by mouth daily. Start taking on: November 05, 2023        Allergies  Allergen Reactions   Lipitor [Atorvastatin ] Shortness Of Breath and Other (See Comments)    Sores non head   Morphine  And Codeine Shortness Of Breath, Swelling and Rash    Throat swelling   Pork-Derived Products Swelling and Rash    Tongue swelling No pork products -      Other Procedures/Studies: EEG adult Result Date: 11/04/2023 Shelton Arlin KIDD, MD     11/04/2023  8:34 AM Patient Name: Deontra Pereyra MRN: [id number] Epilepsy  Attending: Arlin KIDD Shelton Referring Physician/Provider: Jerri Pfeiffer, MD Date: 11/03/2023 Duration: 22.25 mins Patient history:  83 y.o. male with history of CAD with CABGx4, PE previously on eliquis  who presented post-syncope. EEG to evaluate for seizure Level of alertness: Awake AEDs during EEG study: None Technical aspects: This EEG study was done with scalp electrodes positioned according to the 10-20 International system of electrode placement. Electrical activity was reviewed with band pass filter of 1-70Hz , sensitivity of 7 uV/mm, display speed of 10mm/sec with a 60Hz  notched filter applied as appropriate. EEG data were recorded continuously and digitally stored.  Video monitoring was available and reviewed as appropriate. Description: The posterior dominant rhythm consists of 8 Hz activity of moderate voltage (25-35 uV) seen predominantly in posterior head regions, symmetric and reactive to eye opening and eye closing. EEG showed intermittent generalized 3 to 6 Hz theta-delta slowing. Hyperventilation and photic stimulation were not performed.   ABNORMALITY - Intermittent slow, generalized IMPRESSION: This study is suggestive of mild diffuse encephalopathy. No seizures or epileptiform discharges were seen throughout the recording. Arlin KIDD Shelton   ECHOCARDIOGRAM COMPLETE Result Date: 11/03/2023    ECHOCARDIOGRAM REPORT   Patient Name:   Gust Eugene Date of Exam: 11/03/2023 Medical Rec #:  [id number]        Height:       66.0 in Accession #:    [id number]       Weight:       160.0 lb Date of Birth:  Nov 18, 1940        BSA:          1.819 m Patient Age:    83 years         BP:           149/97 mmHg Patient Gender: M                HR:           57 bpm. Exam Location:  Inpatient Procedure: 2D Echo, Cardiac Doppler and Color Doppler (Both Spectral and Color            Flow Doppler were utilized during procedure). Indications:    Stroke I63.9  History:        Patient has prior history of Echocardiogram  examinations, most                 recent 08/09/2021. CHF, CAD, Stroke, Signs/Symptoms:Chest Pain                 and Syncope; Risk Factors:Hypertension and Dyslipidemia.  Sonographer:    Thea Norlander RCS Referring Phys: PFEIFFER JERRI IMPRESSIONS  1. Left ventricular ejection fraction, by estimation, is  60 to 65%. The left ventricle has normal function. The left ventricle has no regional wall motion abnormalities. Left ventricular diastolic parameters are consistent with Grade I diastolic dysfunction (impaired relaxation).  2. Right ventricular systolic function is normal. The right ventricular size is normal. Tricuspid regurgitation signal is inadequate for assessing PA pressure.  3. The mitral valve is normal in structure. No evidence of mitral valve regurgitation. No evidence of mitral stenosis.  4. The aortic valve is tricuspid. Aortic valve regurgitation is trivial. Aortic valve sclerosis/calcification is present, without any evidence of aortic stenosis. Aortic valve Vmax measures 1.16 m/s.  5. The inferior vena cava is normal in size with greater than 50% respiratory variability, suggesting right atrial pressure of 3 mmHg. FINDINGS  Left Ventricle: Left ventricular ejection fraction, by estimation, is 60 to 65%. The left ventricle has normal function. The left ventricle has no regional wall motion abnormalities. The left ventricular internal cavity size was normal in size. There is  no left ventricular hypertrophy. Left ventricular diastolic parameters are consistent with Grade I diastolic dysfunction (impaired relaxation). Normal left ventricular filling pressure. Right Ventricle: The right ventricular size is normal. No increase in right ventricular wall thickness. Right ventricular systolic function is normal. Tricuspid regurgitation signal is inadequate for assessing PA pressure. Left Atrium: Left atrial size was normal in size. Right Atrium: Right atrial size was normal in size. Pericardium: There is no  evidence of pericardial effusion. Mitral Valve: The mitral valve is normal in structure. Mild to moderate mitral annular calcification. No evidence of mitral valve regurgitation. No evidence of mitral valve stenosis. Tricuspid Valve: The tricuspid valve is normal in structure. Tricuspid valve regurgitation is trivial. No evidence of tricuspid stenosis. Aortic Valve: The aortic valve is tricuspid. Aortic valve regurgitation is trivial. Aortic valve sclerosis/calcification is present, without any evidence of aortic stenosis. Aortic valve peak gradient measures 5.4 mmHg. Pulmonic Valve: The pulmonic valve was normal in structure. Pulmonic valve regurgitation is not visualized. No evidence of pulmonic stenosis. Aorta: The aortic root is normal in size and structure. Venous: The inferior vena cava is normal in size with greater than 50% respiratory variability, suggesting right atrial pressure of 3 mmHg. IAS/Shunts: No atrial level shunt detected by color flow Doppler.  LEFT VENTRICLE PLAX 2D LVIDd:         3.20 cm   Diastology LVIDs:         2.20 cm   LV e' medial:    6.74 cm/s LV PW:         1.00 cm   LV E/e' medial:  11.7 LV IVS:        1.10 cm   LV e' lateral:   6.20 cm/s LVOT diam:     2.00 cm   LV E/e' lateral: 12.8 LV SV:         55 LV SV Index:   30 LVOT Area:     3.14 cm  RIGHT VENTRICLE             IVC RV S prime:     12.90 cm/s  IVC diam: 1.50 cm TAPSE (M-mode): 1.1 cm LEFT ATRIUM             Index        RIGHT ATRIUM           Index LA diam:        4.20 cm 2.31 cm/m   RA Area:     15.10 cm LA Vol (A2C):  45.1 ml 24.80 ml/m  RA Volume:   31.50 ml  17.32 ml/m LA Vol (A4C):   29.5 ml 16.22 ml/m LA Biplane Vol: 38.5 ml 21.17 ml/m  AORTIC VALVE AV Area (Vmax): 2.02 cm AV Vmax:        115.70 cm/s AV Peak Grad:   5.4 mmHg LVOT Vmax:      74.40 cm/s LVOT Vmean:     49.800 cm/s LVOT VTI:       0.174 m  AORTA Ao Root diam: 3.50 cm Ao Asc diam:  3.50 cm MITRAL VALVE MV Area (PHT): 2.91 cm     SHUNTS MV Decel  Time: 261 msec     Systemic VTI:  0.17 m MV E velocity: 79.10 cm/s   Systemic Diam: 2.00 cm MV A velocity: 103.00 cm/s MV E/A ratio:  0.77 Wilbert Bihari MD Electronically signed by Wilbert Bihari MD Signature Date/Time: 11/03/2023/11:23:14 AM    Final    VAS US  LOWER EXTREMITY VENOUS (DVT) Result Date: 11/03/2023  Lower Venous DVT Study Patient Name:  TOBBY FAWCETT  Date of Exam:   11/03/2023 Medical Rec #: [id number]         Accession #:    [id number] Date of Birth: 05-18-1940         Patient Gender: M Patient Age:   69 years Exam Location:  [facility] Procedure:      VAS US  LOWER EXTREMITY VENOUS (DVT) Referring Phys: COLEN GRIMES --------------------------------------------------------------------------------  Indications: Stroke, and Syncope, chest pain.  Risk Factors: Remote PE. Comparison Study: Prior negative bilateral LEV done 11/05/2017 Performing Technologist: Alberta Lis RVS  Examination Guidelines: A complete evaluation includes B-mode imaging, spectral Doppler, color Doppler, and power Doppler as needed of all accessible portions of each vessel. Bilateral testing is considered an integral part of a complete examination. Limited examinations for reoccurring indications may be performed as noted. The reflux portion of the exam is performed with the patient in reverse Trendelenburg.  +---------+---------------+---------+-----------+----------+--------------+ RIGHT    CompressibilityPhasicitySpontaneityPropertiesThrombus Aging +---------+---------------+---------+-----------+----------+--------------+ CFV      Full           Yes      Yes                                 +---------+---------------+---------+-----------+----------+--------------+ SFJ      Full                                                        +---------+---------------+---------+-----------+----------+--------------+ FV Prox  Full           Yes      No                   Rouleaux flow   +---------+---------------+---------+-----------+----------+--------------+ FV Mid   Full                                                        +---------+---------------+---------+-----------+----------+--------------+ FV DistalFull                                                        +---------+---------------+---------+-----------+----------+--------------+  PFV      Full           Yes      No                                  +---------+---------------+---------+-----------+----------+--------------+ POP      Full           Yes      No                                  +---------+---------------+---------+-----------+----------+--------------+ PTV      Full                                                        +---------+---------------+---------+-----------+----------+--------------+ PERO     Full                                                        +---------+---------------+---------+-----------+----------+--------------+ Gastroc  Full                                                        +---------+---------------+---------+-----------+----------+--------------+   +---------+---------------+---------+-----------+----------+--------------+ LEFT     CompressibilityPhasicitySpontaneityPropertiesThrombus Aging +---------+---------------+---------+-----------+----------+--------------+ CFV      Full           Yes      Yes                                 +---------+---------------+---------+-----------+----------+--------------+ SFJ      Full                                                        +---------+---------------+---------+-----------+----------+--------------+ FV Prox  Full           Yes      No                                  +---------+---------------+---------+-----------+----------+--------------+ FV Mid   Full                                                         +---------+---------------+---------+-----------+----------+--------------+ FV DistalFull                                                        +---------+---------------+---------+-----------+----------+--------------+  PFV      Full           Yes      No                                  +---------+---------------+---------+-----------+----------+--------------+ POP      Full           Yes      No                   Rouleaux flow  +---------+---------------+---------+-----------+----------+--------------+ PTV      Full                                                        +---------+---------------+---------+-----------+----------+--------------+ PERO     Full                                                        +---------+---------------+---------+-----------+----------+--------------+ Gastroc  Full                                                        +---------+---------------+---------+-----------+----------+--------------+    Summary: RIGHT: - There is no evidence of deep vein thrombosis in the lower extremity.  - No cystic structure found in the popliteal fossa. Rouleaux flow noted  LEFT: - There is no evidence of deep vein thrombosis in the lower extremity.  - No cystic structure found in the popliteal fossa. Rouleaux flow noted.  *See table(s) above for measurements and observations.    Preliminary    CT Angio Chest Pulmonary Embolism (PE) W or WO Contrast Result Date: 11/03/2023 CLINICAL DATA:  83 year old male code stroke presentation yesterday. Right basal ganglia lacunar infarct. Bilateral lower extremity swelling. EXAM: CT ANGIOGRAPHY CHEST WITH CONTRAST TECHNIQUE: Multidetector CT imaging of the chest was performed using the standard protocol during bolus administration of intravenous contrast. Multiplanar CT image reconstructions and MIPs were obtained to evaluate the vascular anatomy. RADIATION DOSE REDUCTION: This exam was performed according to the  departmental dose-optimization program which includes automated exposure control, adjustment of the mA and/or kV according to patient size and/or use of iterative reconstruction technique. CONTRAST:  75mL OMNIPAQUE  IOHEXOL  350 MG/ML SOLN COMPARISON:  CTA head and neck yesterday.  CTA chest 12/27/2021. FINDINGS: Cardiovascular: Excellent contrast bolus timing in the pulmonary arterial tree. No saddle or lobar pulmonary artery filling defect. Right lower lobe segmental or subsegmental chronic filling defect on series 7, image 235, same branch was affected on 2023 CTA. This appears stable and nonocclusive. No acute or additional pulmonary artery filling defect identified. Heart size stable since 2023. No pericardial effusion. Prior CABG. Calcified aortic atherosclerosis. No contrast in the aorta. Mediastinum/Nodes: Chronic moderate to large gastric hiatal hernia. No superimposed mediastinal mass or lymphadenopathy. Lungs/Pleura: Mildly lower lung volumes compared to 2023. Major airways are patent. Increased but fairly symmetric bilateral lower lung atelectasis, slightly greater on the left related to hiatal hernia.  No pleural effusion. No consolidation or convincing active lung inflammation. Upper Abdomen: Vicarious contrast excretion in the gallbladder. Negative visible mostly noncontrast liver, spleen, pancreas, adrenal glands, bowel in the upper abdomen. Faint contrast excretion from both kidneys to nondilated collecting systems. Musculoskeletal: Healed sternotomy. No acute osseous abnormality identified. Review of the MIP images confirms the above findings. IMPRESSION: 1. Negative for acute pulmonary embolus. Positive for a small chronic right lower lobe PE which appears nonocclusive and stable since 2023. 2. Moderate to large gastric hiatal hernia.  Lung base atelectasis. 3.  Aortic Atherosclerosis (ICD10-I70.0).  CABG. Electronically Signed   By: VEAR Hurst M.D.   On: 11/03/2023 10:38   MR BRAIN WO  CONTRAST Result Date: 11/02/2023 EXAM: MRI BRAIN WITHOUT CONTRAST 11/02/2023 02:24:40 PM TECHNIQUE: Multiplanar multisequence MRI of the head/brain was performed without the administration of intravenous contrast. COMPARISON: CT head and CTA head and neck 11/02/2023 CLINICAL HISTORY: Neuro deficit, acute, stroke suspected. FINDINGS: BRAIN AND VENTRICLES: An acute 10 mm nonhemorrhagic infarct is present in the lateral right thalamus. T2 and FLAIR signal is associated. Remote lacunar infarcts are present within the thalami bilaterally. Remote lacunar infarcts are present in the right paramedian pons and within the left cerebellum. Confluent periventricular and subcortical white matter T2 signal changes are advanced for age. Multiple punctate foci of susceptibility are present within the thalami, cerebellum, and brainstem and to a lesser extent scattered in the subcortical white matter over the convexities. Prominent vascular spaces are present in the basal ganglia. ORBITS: No acute abnormality. SINUSES AND MASTOIDS: Polyps are present within the nasal cavity. Mild mucosal thickening is present in the left greater than right maxillary sinus. Scattered mucosal thickening is present throughout the ethmoid air cells and sphenoid sinuses. The right frontal sinus is opacified. BONES AND SOFT TISSUES: Normal marrow signal. No acute soft tissue abnormality. IMPRESSION: 1. Acute 10 mm nonhemorrhagic infarct in the lateral right putamen nucleus with associated T2 and FLAIR signal. 2. Advanced confluent periventricular and subcortical white matter T2 signal changes for age. 3. Multiple punctate foci of susceptibility within the thalami, cerebellum, brainstem, and scattered in the subcortical white matter over the convexities. This is consistent with microhemorrhages and suggests underlying vasculitis 4. Remote lacunar infarcts in the thalami bilaterally, right paramedian pons, and left cerebellum. The pertinent results were  texted to Dr. Vanessa via the [facility] system at 3:32 pm. Electronically signed by: Lonni Necessary MD 11/02/2023 03:35 PM EDT RP Workstation: [id number]   CT ANGIO HEAD NECK W WO CM (CODE STROKE) Result Date: 11/02/2023 CLINICAL DATA:  83 year old male code stroke presentation, aphasia. EXAM: CT ANGIOGRAPHY HEAD AND NECK TECHNIQUE: Multidetector CT imaging of the head and neck was performed using the standard protocol during bolus administration of intravenous contrast. Multiplanar CT image reconstructions and MIPs were obtained to evaluate the vascular anatomy. Carotid stenosis measurements (when applicable) are obtained utilizing NASCET criteria, using the distal internal carotid diameter as the denominator. RADIATION DOSE REDUCTION: This exam was performed according to the departmental dose-optimization program which includes automated exposure control, adjustment of the mA and/or kV according to patient size and/or use of iterative reconstruction technique. CONTRAST:  75mL OMNIPAQUE  IOHEXOL  350 MG/ML SOLN COMPARISON:  Head CT 1250 hours today. CTA head and neck 05/02/2016. FINDINGS: CTA NECK Skeleton: Sternotomy. Ordinary cervical spine degeneration. No acute osseous abnormality identified. Upper chest: Sternotomy may be new since 2018. Evidence of CABG. Visible central pulmonary arteries appear patent, enhancing. Mild upper lung atelectasis. Other neck: Nonvascular neck soft tissue  spaces appear stable since 2018. Small chronic retention cyst of the right epiglottic fold suspected (series 7, image 106), stable. No acute or suspicious finding identified. Aortic arch: Calcified aortic atherosclerosis.  3 vessel arch. Right carotid system: Brachiocephalic artery tortuosity and atherosclerosis without stenosis. Mildly tortuous proximal right CCA. Patent right carotid bifurcation with mild plaque. No significant stenosis to the skull base. Left carotid system: Left CCA origin plaque without stenosis.  Tortuous left CCA. Mild plaque from the left carotid bifurcation through the left ICA bulb without stenosis. Vertebral arteries: Soft plaque proximal right subclavian artery is mild without stenosis. Mild calcified plaque at the right vertebral artery origin without stenosis. Non dominant right vertebral artery has a stable caliber since 2018, remains diminutive but patent to the skull base. No stenosis in the neck. Proximal left subclavian artery soft calcified plaque without stenosis. Mild tortuosity. Calcified plaque at the left vertebral origin. No significant stenosis. Dominant left vertebral artery, tortuous V1 segment. No other left vertebral plaque or stenosis to the skull base. CTA HEAD Posterior circulation: Dominant left vertebral artery with normal PICA origins primarily supplies the basilar without plaque or stenosis. Right V4 segment is diminutive but remains patent to the vertebrobasilar junction. Right AICA appears to be dominant. Patent basilar artery with mild irregularity. No basilar stenosis. Patent SCA and PCA origins. Posterior communicating arteries are diminutive or absent. Stable basilar tip since 2018. Chronic bilateral PCA atherosclerosis and irregularity. Bilateral PCA branches remain patent. Up to moderate left P2 irregularity, mild-to-moderate stenosis does appear increased since 2018 on series 605, image 45. Contralateral mild right P2 irregularity appears stable. Anterior circulation: Both ICA siphons are patent. Bulky left siphon cavernous and proximal supraclinoid segment calcified plaque. And moderate distal cavernous and anterior genu left ICA stenosis appears increased since 2018. Contralateral right siphon moderate calcified plaque with mild cavernous but up to moderate supraclinoid right ICA stenosis (series 5, image 104) appears stable since 2018. Carotid termini are patent. MCA and ACA origins are patent. Dominant left A1 again noted. Normal anterior communicating artery.  Bilateral ACA branches are patent. But moderate left ACA A2 irregularity and stenosis has increased since 2018 on series 9, image 39. Left MCA M1 segment and bifurcation are patent without stenosis. Right MCA M1 segment and trifurcation are patent with mild irregularity, no stenosis. Bilateral MCA branches are stable. No significant branch stenosis identified. Venous sinuses: Early contrast timing, not evaluated. Anatomic variants: Dominant left vertebral artery, right is diminutive. Dominant left ACA A1. Review of the MIP images confirms the above findings IMPRESSION: 1. Negative for large vessel occlusion. 2. Positive for abundant atherosclerosis in the head and neck. Notable and/or increased since 2018 CTA stenoses: - Moderate LICA siphon stenosis appears progressed since 2018. Moderate Right siphon stenosis appears stable. - Left ACA A2 segment Moderate stenosis is increased. - Moderate Left PCA P2 stenosis appears increased 3. No significant extracranial stenosis. 4.  Aortic Atherosclerosis (ICD10-I70.0). Electronically Signed   By: VEAR Hurst M.D.   On: 11/02/2023 13:16   CT HEAD CODE STROKE WO CONTRAST Result Date: 11/02/2023 CLINICAL DATA:  Code stroke.  83 year old male.  Aphasia. EXAM: CT HEAD WITHOUT CONTRAST TECHNIQUE: Contiguous axial images were obtained from the base of the skull through the vertex without intravenous contrast. RADIATION DOSE REDUCTION: This exam was performed according to the departmental dose-optimization program which includes automated exposure control, adjustment of the mA and/or kV according to patient size and/or use of iterative reconstruction technique. COMPARISON:  Brain MRI 11/04/2017.  Head CT 08/08/2020. FINDINGS: Brain: Stable cerebral volume. No ventriculomegaly. No midline shift, mass effect, or evidence of intracranial mass lesion. No acute intracranial hemorrhage identified. Advanced chronic small vessel disease. Confluent, widespread bilateral cerebral white matter  hypodensity and extensive heterogeneity in the bilateral deep gray nuclei. No significant change from the 2022 CT. Posterior fossa appears relatively spared. No cortically based acute infarct identified. Vascular: Calcified atherosclerosis at the skull base. No suspicious intracranial vascular hyperdensity. Skull: Stable and intact. Sinuses/Orbits: Chronic sinonasal polyposis, stable since 2022. Areas of associated chronic paranasal sinus periosteal thickening. Tympanic cavities and mastoids remain well aerated. Other: No gaze deviation. Visualized orbits and scalp soft tissues are within normal limits. ASPECTS [facility] Stroke Program Early CT Score) Total score (0-10 with 10 being normal): 10 IMPRESSION: 1. No acute cortically based infarct or acute intracranial hemorrhage identified. ASPECTS 10. 2. Advanced chronic small vessel disease appears stable by CT since 2022. 3. Chronic sinonasal polyposis. #1 and #2 communicated to Dr. Khaliqdina at 12:59 pm on 11/02/2023 by text page via the [facility] messaging system. Electronically Signed   By: VEAR Hurst M.D.   On: 11/02/2023 13:00     TODAY-DAY OF DISCHARGE:  Subjective:   Rafferty Postlewait today has no headache,no chest abdominal pain,no new weakness tingling or numbness, feels much better wants to go home today.   Objective:   Blood pressure (!) 144/74, pulse (!) 59, temperature (!) 97.3 F (36.3 C), temperature source Oral, resp. rate 18, height 5' 6 (1.676 m), weight 72.6 kg, SpO2 97%.  Intake/Output Summary (Last 24 hours) at 11/04/2023 0924 Last data filed at 11/04/2023 0400 Gross per 24 hour  Intake --  Output 200 ml  Net -200 ml   Filed Weights   11/02/23 1311  Weight: 72.6 kg    Exam: Awake Alert, Oriented *3, No new F.N deficits, Normal affect [place].AT,PERRAL Supple Neck,No JVD, No cervical lymphadenopathy appriciated.  Symmetrical Chest wall movement, Good air movement bilaterally, CTAB RRR,No Gallops,Rubs or new Murmurs, No Parasternal  Heave +ve B.Sounds, Abd Soft, Non tender, No organomegaly appriciated, No rebound -guarding or rigidity. No Cyanosis, Clubbing or edema, No new Rash or bruise   PERTINENT RADIOLOGIC STUDIES: EEG adult Result Date: 11/04/2023 Shelton Arlin KIDD, MD     11/04/2023  8:34 AM Patient Name: Wyett Narine MRN: [id number] Epilepsy Attending: Arlin KIDD Shelton Referring Physician/Provider: Jerri Pfeiffer, MD Date: 11/03/2023 Duration: 22.25 mins Patient history:  83 y.o. male with history of CAD with CABGx4, PE previously on eliquis  who presented post-syncope. EEG to evaluate for seizure Level of alertness: Awake AEDs during EEG study: None Technical aspects: This EEG study was done with scalp electrodes positioned according to the 10-20 International system of electrode placement. Electrical activity was reviewed with band pass filter of 1-70Hz , sensitivity of 7 uV/mm, display speed of 15mm/sec with a 60Hz  notched filter applied as appropriate. EEG data were recorded continuously and digitally stored.  Video monitoring was available and reviewed as appropriate. Description: The posterior dominant rhythm consists of 8 Hz activity of moderate voltage (25-35 uV) seen predominantly in posterior head regions, symmetric and reactive to eye opening and eye closing. EEG showed intermittent generalized 3 to 6 Hz theta-delta slowing. Hyperventilation and photic stimulation were not performed.   ABNORMALITY - Intermittent slow, generalized IMPRESSION: This study is suggestive of mild diffuse encephalopathy. No seizures or epileptiform discharges were seen throughout the recording. Arlin KIDD Shelton   ECHOCARDIOGRAM COMPLETE Result Date: 11/03/2023    ECHOCARDIOGRAM REPORT  Patient Name:   Jaydin Boniface Date of Exam: 11/03/2023 Medical Rec #:  [id number]        Height:       66.0 in Accession #:    [id number]       Weight:       160.0 lb Date of Birth:  19-Aug-1940        BSA:          1.819 m Patient Age:    83 years         BP:            149/97 mmHg Patient Gender: M                HR:           57 bpm. Exam Location:  Inpatient Procedure: 2D Echo, Cardiac Doppler and Color Doppler (Both Spectral and Color            Flow Doppler were utilized during procedure). Indications:    Stroke I63.9  History:        Patient has prior history of Echocardiogram examinations, most                 recent 08/09/2021. CHF, CAD, Stroke, Signs/Symptoms:Chest Pain                 and Syncope; Risk Factors:Hypertension and Dyslipidemia.  Sonographer:    Thea Norlander RCS Referring Phys: ARY CUMMINS IMPRESSIONS  1. Left ventricular ejection fraction, by estimation, is 60 to 65%. The left ventricle has normal function. The left ventricle has no regional wall motion abnormalities. Left ventricular diastolic parameters are consistent with Grade I diastolic dysfunction (impaired relaxation).  2. Right ventricular systolic function is normal. The right ventricular size is normal. Tricuspid regurgitation signal is inadequate for assessing PA pressure.  3. The mitral valve is normal in structure. No evidence of mitral valve regurgitation. No evidence of mitral stenosis.  4. The aortic valve is tricuspid. Aortic valve regurgitation is trivial. Aortic valve sclerosis/calcification is present, without any evidence of aortic stenosis. Aortic valve Vmax measures 1.16 m/s.  5. The inferior vena cava is normal in size with greater than 50% respiratory variability, suggesting right atrial pressure of 3 mmHg. FINDINGS  Left Ventricle: Left ventricular ejection fraction, by estimation, is 60 to 65%. The left ventricle has normal function. The left ventricle has no regional wall motion abnormalities. The left ventricular internal cavity size was normal in size. There is  no left ventricular hypertrophy. Left ventricular diastolic parameters are consistent with Grade I diastolic dysfunction (impaired relaxation). Normal left ventricular filling pressure. Right Ventricle: The  right ventricular size is normal. No increase in right ventricular wall thickness. Right ventricular systolic function is normal. Tricuspid regurgitation signal is inadequate for assessing PA pressure. Left Atrium: Left atrial size was normal in size. Right Atrium: Right atrial size was normal in size. Pericardium: There is no evidence of pericardial effusion. Mitral Valve: The mitral valve is normal in structure. Mild to moderate mitral annular calcification. No evidence of mitral valve regurgitation. No evidence of mitral valve stenosis. Tricuspid Valve: The tricuspid valve is normal in structure. Tricuspid valve regurgitation is trivial. No evidence of tricuspid stenosis. Aortic Valve: The aortic valve is tricuspid. Aortic valve regurgitation is trivial. Aortic valve sclerosis/calcification is present, without any evidence of aortic stenosis. Aortic valve peak gradient measures 5.4 mmHg. Pulmonic Valve: The pulmonic valve was normal in structure. Pulmonic valve regurgitation is not visualized. No evidence of  pulmonic stenosis. Aorta: The aortic root is normal in size and structure. Venous: The inferior vena cava is normal in size with greater than 50% respiratory variability, suggesting right atrial pressure of 3 mmHg. IAS/Shunts: No atrial level shunt detected by color flow Doppler.  LEFT VENTRICLE PLAX 2D LVIDd:         3.20 cm   Diastology LVIDs:         2.20 cm   LV e' medial:    6.74 cm/s LV PW:         1.00 cm   LV E/e' medial:  11.7 LV IVS:        1.10 cm   LV e' lateral:   6.20 cm/s LVOT diam:     2.00 cm   LV E/e' lateral: 12.8 LV SV:         55 LV SV Index:   30 LVOT Area:     3.14 cm  RIGHT VENTRICLE             IVC RV S prime:     12.90 cm/s  IVC diam: 1.50 cm TAPSE (M-mode): 1.1 cm LEFT ATRIUM             Index        RIGHT ATRIUM           Index LA diam:        4.20 cm 2.31 cm/m   RA Area:     15.10 cm LA Vol (A2C):   45.1 ml 24.80 ml/m  RA Volume:   31.50 ml  17.32 ml/m LA Vol (A4C):   29.5 ml  16.22 ml/m LA Biplane Vol: 38.5 ml 21.17 ml/m  AORTIC VALVE AV Area (Vmax): 2.02 cm AV Vmax:        115.70 cm/s AV Peak Grad:   5.4 mmHg LVOT Vmax:      74.40 cm/s LVOT Vmean:     49.800 cm/s LVOT VTI:       0.174 m  AORTA Ao Root diam: 3.50 cm Ao Asc diam:  3.50 cm MITRAL VALVE MV Area (PHT): 2.91 cm     SHUNTS MV Decel Time: 261 msec     Systemic VTI:  0.17 m MV E velocity: 79.10 cm/s   Systemic Diam: 2.00 cm MV A velocity: 103.00 cm/s MV E/A ratio:  0.77 Wilbert Bihari MD Electronically signed by Wilbert Bihari MD Signature Date/Time: 11/03/2023/11:23:14 AM    Final    VAS US  LOWER EXTREMITY VENOUS (DVT) Result Date: 11/03/2023  Lower Venous DVT Study Patient Name:  ATLEY NEUBERT  Date of Exam:   11/03/2023 Medical Rec #: [id number]         Accession #:    [id number] Date of Birth: June 08, 1940         Patient Gender: M Patient Age:   47 years Exam Location:  [facility] Procedure:      VAS US  LOWER EXTREMITY VENOUS (DVT) Referring Phys: COLEN GRIMES --------------------------------------------------------------------------------  Indications: Stroke, and Syncope, chest pain.  Risk Factors: Remote PE. Comparison Study: Prior negative bilateral LEV done 11/05/2017 Performing Technologist: Alberta Lis RVS  Examination Guidelines: A complete evaluation includes B-mode imaging, spectral Doppler, color Doppler, and power Doppler as needed of all accessible portions of each vessel. Bilateral testing is considered an integral part of a complete examination. Limited examinations for reoccurring indications may be performed as noted. The reflux portion of the exam is performed with the patient in reverse Trendelenburg.  +---------+---------------+---------+-----------+----------+--------------+ RIGHT  CompressibilityPhasicitySpontaneityPropertiesThrombus Aging +---------+---------------+---------+-----------+----------+--------------+ CFV      Full           Yes      Yes                                  +---------+---------------+---------+-----------+----------+--------------+ SFJ      Full                                                        +---------+---------------+---------+-----------+----------+--------------+ FV Prox  Full           Yes      No                   Rouleaux flow  +---------+---------------+---------+-----------+----------+--------------+ FV Mid   Full                                                        +---------+---------------+---------+-----------+----------+--------------+ FV DistalFull                                                        +---------+---------------+---------+-----------+----------+--------------+ PFV      Full           Yes      No                                  +---------+---------------+---------+-----------+----------+--------------+ POP      Full           Yes      No                                  +---------+---------------+---------+-----------+----------+--------------+ PTV      Full                                                        +---------+---------------+---------+-----------+----------+--------------+ PERO     Full                                                        +---------+---------------+---------+-----------+----------+--------------+ Gastroc  Full                                                        +---------+---------------+---------+-----------+----------+--------------+   +---------+---------------+---------+-----------+----------+--------------+ LEFT     CompressibilityPhasicitySpontaneityPropertiesThrombus Aging +---------+---------------+---------+-----------+----------+--------------+ CFV  Full           Yes      Yes                                 +---------+---------------+---------+-----------+----------+--------------+ SFJ      Full                                                         +---------+---------------+---------+-----------+----------+--------------+ FV Prox  Full           Yes      No                                  +---------+---------------+---------+-----------+----------+--------------+ FV Mid   Full                                                        +---------+---------------+---------+-----------+----------+--------------+ FV DistalFull                                                        +---------+---------------+---------+-----------+----------+--------------+ PFV      Full           Yes      No                                  +---------+---------------+---------+-----------+----------+--------------+ POP      Full           Yes      No                   Rouleaux flow  +---------+---------------+---------+-----------+----------+--------------+ PTV      Full                                                        +---------+---------------+---------+-----------+----------+--------------+ PERO     Full                                                        +---------+---------------+---------+-----------+----------+--------------+ Gastroc  Full                                                        +---------+---------------+---------+-----------+----------+--------------+    Summary: RIGHT: - There is no evidence of deep vein thrombosis in the lower extremity.  - No cystic structure  found in the popliteal fossa. Rouleaux flow noted  LEFT: - There is no evidence of deep vein thrombosis in the lower extremity.  - No cystic structure found in the popliteal fossa. Rouleaux flow noted.  *See table(s) above for measurements and observations.    Preliminary    CT Angio Chest Pulmonary Embolism (PE) W or WO Contrast Result Date: 11/03/2023 CLINICAL DATA:  83 year old male code stroke presentation yesterday. Right basal ganglia lacunar infarct. Bilateral lower extremity swelling. EXAM: CT ANGIOGRAPHY CHEST WITH CONTRAST  TECHNIQUE: Multidetector CT imaging of the chest was performed using the standard protocol during bolus administration of intravenous contrast. Multiplanar CT image reconstructions and MIPs were obtained to evaluate the vascular anatomy. RADIATION DOSE REDUCTION: This exam was performed according to the departmental dose-optimization program which includes automated exposure control, adjustment of the mA and/or kV according to patient size and/or use of iterative reconstruction technique. CONTRAST:  75mL OMNIPAQUE  IOHEXOL  350 MG/ML SOLN COMPARISON:  CTA head and neck yesterday.  CTA chest 12/27/2021. FINDINGS: Cardiovascular: Excellent contrast bolus timing in the pulmonary arterial tree. No saddle or lobar pulmonary artery filling defect. Right lower lobe segmental or subsegmental chronic filling defect on series 7, image 235, same branch was affected on 2023 CTA. This appears stable and nonocclusive. No acute or additional pulmonary artery filling defect identified. Heart size stable since 2023. No pericardial effusion. Prior CABG. Calcified aortic atherosclerosis. No contrast in the aorta. Mediastinum/Nodes: Chronic moderate to large gastric hiatal hernia. No superimposed mediastinal mass or lymphadenopathy. Lungs/Pleura: Mildly lower lung volumes compared to 2023. Major airways are patent. Increased but fairly symmetric bilateral lower lung atelectasis, slightly greater on the left related to hiatal hernia. No pleural effusion. No consolidation or convincing active lung inflammation. Upper Abdomen: Vicarious contrast excretion in the gallbladder. Negative visible mostly noncontrast liver, spleen, pancreas, adrenal glands, bowel in the upper abdomen. Faint contrast excretion from both kidneys to nondilated collecting systems. Musculoskeletal: Healed sternotomy. No acute osseous abnormality identified. Review of the MIP images confirms the above findings. IMPRESSION: 1. Negative for acute pulmonary embolus.  Positive for a small chronic right lower lobe PE which appears nonocclusive and stable since 2023. 2. Moderate to large gastric hiatal hernia.  Lung base atelectasis. 3.  Aortic Atherosclerosis (ICD10-I70.0).  CABG. Electronically Signed   By: VEAR Hurst M.D.   On: 11/03/2023 10:38   MR BRAIN WO CONTRAST Result Date: 11/02/2023 EXAM: MRI BRAIN WITHOUT CONTRAST 11/02/2023 02:24:40 PM TECHNIQUE: Multiplanar multisequence MRI of the head/brain was performed without the administration of intravenous contrast. COMPARISON: CT head and CTA head and neck 11/02/2023 CLINICAL HISTORY: Neuro deficit, acute, stroke suspected. FINDINGS: BRAIN AND VENTRICLES: An acute 10 mm nonhemorrhagic infarct is present in the lateral right thalamus. T2 and FLAIR signal is associated. Remote lacunar infarcts are present within the thalami bilaterally. Remote lacunar infarcts are present in the right paramedian pons and within the left cerebellum. Confluent periventricular and subcortical white matter T2 signal changes are advanced for age. Multiple punctate foci of susceptibility are present within the thalami, cerebellum, and brainstem and to a lesser extent scattered in the subcortical white matter over the convexities. Prominent vascular spaces are present in the basal ganglia. ORBITS: No acute abnormality. SINUSES AND MASTOIDS: Polyps are present within the nasal cavity. Mild mucosal thickening is present in the left greater than right maxillary sinus. Scattered mucosal thickening is present throughout the ethmoid air cells and sphenoid sinuses. The right frontal sinus is opacified. BONES AND SOFT TISSUES: Normal marrow  signal. No acute soft tissue abnormality. IMPRESSION: 1. Acute 10 mm nonhemorrhagic infarct in the lateral right putamen nucleus with associated T2 and FLAIR signal. 2. Advanced confluent periventricular and subcortical white matter T2 signal changes for age. 3. Multiple punctate foci of susceptibility within the thalami,  cerebellum, brainstem, and scattered in the subcortical white matter over the convexities. This is consistent with microhemorrhages and suggests underlying vasculitis 4. Remote lacunar infarcts in the thalami bilaterally, right paramedian pons, and left cerebellum. The pertinent results were texted to Dr. Vanessa via the [facility] system at 3:32 pm. Electronically signed by: Lonni Necessary MD 11/02/2023 03:35 PM EDT RP Workstation: [id number]   CT ANGIO HEAD NECK W WO CM (CODE STROKE) Result Date: 11/02/2023 CLINICAL DATA:  83 year old male code stroke presentation, aphasia. EXAM: CT ANGIOGRAPHY HEAD AND NECK TECHNIQUE: Multidetector CT imaging of the head and neck was performed using the standard protocol during bolus administration of intravenous contrast. Multiplanar CT image reconstructions and MIPs were obtained to evaluate the vascular anatomy. Carotid stenosis measurements (when applicable) are obtained utilizing NASCET criteria, using the distal internal carotid diameter as the denominator. RADIATION DOSE REDUCTION: This exam was performed according to the departmental dose-optimization program which includes automated exposure control, adjustment of the mA and/or kV according to patient size and/or use of iterative reconstruction technique. CONTRAST:  75mL OMNIPAQUE  IOHEXOL  350 MG/ML SOLN COMPARISON:  Head CT 1250 hours today. CTA head and neck 05/02/2016. FINDINGS: CTA NECK Skeleton: Sternotomy. Ordinary cervical spine degeneration. No acute osseous abnormality identified. Upper chest: Sternotomy may be new since 2018. Evidence of CABG. Visible central pulmonary arteries appear patent, enhancing. Mild upper lung atelectasis. Other neck: Nonvascular neck soft tissue spaces appear stable since 2018. Small chronic retention cyst of the right epiglottic fold suspected (series 7, image 106), stable. No acute or suspicious finding identified. Aortic arch: Calcified aortic atherosclerosis.  3 vessel arch.  Right carotid system: Brachiocephalic artery tortuosity and atherosclerosis without stenosis. Mildly tortuous proximal right CCA. Patent right carotid bifurcation with mild plaque. No significant stenosis to the skull base. Left carotid system: Left CCA origin plaque without stenosis. Tortuous left CCA. Mild plaque from the left carotid bifurcation through the left ICA bulb without stenosis. Vertebral arteries: Soft plaque proximal right subclavian artery is mild without stenosis. Mild calcified plaque at the right vertebral artery origin without stenosis. Non dominant right vertebral artery has a stable caliber since 2018, remains diminutive but patent to the skull base. No stenosis in the neck. Proximal left subclavian artery soft calcified plaque without stenosis. Mild tortuosity. Calcified plaque at the left vertebral origin. No significant stenosis. Dominant left vertebral artery, tortuous V1 segment. No other left vertebral plaque or stenosis to the skull base. CTA HEAD Posterior circulation: Dominant left vertebral artery with normal PICA origins primarily supplies the basilar without plaque or stenosis. Right V4 segment is diminutive but remains patent to the vertebrobasilar junction. Right AICA appears to be dominant. Patent basilar artery with mild irregularity. No basilar stenosis. Patent SCA and PCA origins. Posterior communicating arteries are diminutive or absent. Stable basilar tip since 2018. Chronic bilateral PCA atherosclerosis and irregularity. Bilateral PCA branches remain patent. Up to moderate left P2 irregularity, mild-to-moderate stenosis does appear increased since 2018 on series 605, image 45. Contralateral mild right P2 irregularity appears stable. Anterior circulation: Both ICA siphons are patent. Bulky left siphon cavernous and proximal supraclinoid segment calcified plaque. And moderate distal cavernous and anterior genu left ICA stenosis appears increased since 2018. Contralateral  right  siphon moderate calcified plaque with mild cavernous but up to moderate supraclinoid right ICA stenosis (series 5, image 104) appears stable since 2018. Carotid termini are patent. MCA and ACA origins are patent. Dominant left A1 again noted. Normal anterior communicating artery. Bilateral ACA branches are patent. But moderate left ACA A2 irregularity and stenosis has increased since 2018 on series 9, image 39. Left MCA M1 segment and bifurcation are patent without stenosis. Right MCA M1 segment and trifurcation are patent with mild irregularity, no stenosis. Bilateral MCA branches are stable. No significant branch stenosis identified. Venous sinuses: Early contrast timing, not evaluated. Anatomic variants: Dominant left vertebral artery, right is diminutive. Dominant left ACA A1. Review of the MIP images confirms the above findings IMPRESSION: 1. Negative for large vessel occlusion. 2. Positive for abundant atherosclerosis in the head and neck. Notable and/or increased since 2018 CTA stenoses: - Moderate LICA siphon stenosis appears progressed since 2018. Moderate Right siphon stenosis appears stable. - Left ACA A2 segment Moderate stenosis is increased. - Moderate Left PCA P2 stenosis appears increased 3. No significant extracranial stenosis. 4.  Aortic Atherosclerosis (ICD10-I70.0). Electronically Signed   By: VEAR Hurst M.D.   On: 11/02/2023 13:16   CT HEAD CODE STROKE WO CONTRAST Result Date: 11/02/2023 CLINICAL DATA:  Code stroke.  83 year old male.  Aphasia. EXAM: CT HEAD WITHOUT CONTRAST TECHNIQUE: Contiguous axial images were obtained from the base of the skull through the vertex without intravenous contrast. RADIATION DOSE REDUCTION: This exam was performed according to the departmental dose-optimization program which includes automated exposure control, adjustment of the mA and/or kV according to patient size and/or use of iterative reconstruction technique. COMPARISON:  Brain MRI 11/04/2017.  Head  CT 08/08/2020. FINDINGS: Brain: Stable cerebral volume. No ventriculomegaly. No midline shift, mass effect, or evidence of intracranial mass lesion. No acute intracranial hemorrhage identified. Advanced chronic small vessel disease. Confluent, widespread bilateral cerebral white matter hypodensity and extensive heterogeneity in the bilateral deep gray nuclei. No significant change from the 2022 CT. Posterior fossa appears relatively spared. No cortically based acute infarct identified. Vascular: Calcified atherosclerosis at the skull base. No suspicious intracranial vascular hyperdensity. Skull: Stable and intact. Sinuses/Orbits: Chronic sinonasal polyposis, stable since 2022. Areas of associated chronic paranasal sinus periosteal thickening. Tympanic cavities and mastoids remain well aerated. Other: No gaze deviation. Visualized orbits and scalp soft tissues are within normal limits. ASPECTS [facility] Stroke Program Early CT Score) Total score (0-10 with 10 being normal): 10 IMPRESSION: 1. No acute cortically based infarct or acute intracranial hemorrhage identified. ASPECTS 10. 2. Advanced chronic small vessel disease appears stable by CT since 2022. 3. Chronic sinonasal polyposis. #1 and #2 communicated to Dr. Khaliqdina at 12:59 pm on 11/02/2023 by text page via the [facility] messaging system. Electronically Signed   By: VEAR Hurst M.D.   On: 11/02/2023 13:00     PERTINENT LAB RESULTS: CBC: Recent Labs    11/02/23 1243 11/02/23 1248  WBC 9.0  --   HGB 13.6 13.6  HCT 41.8 40.0  PLT 234  --    CMET CMP     Component Value Date/Time   NA 139 11/02/2023 1248   NA 143 02/27/2022 0954   K 3.7 11/02/2023 1248   CL 102 11/02/2023 1248   CO2 24 11/02/2023 1243   GLUCOSE 130 (H) 11/02/2023 1248   BUN 9 11/02/2023 1248   BUN 15 02/27/2022 0954   CREATININE 1.10 11/02/2023 1248   CREATININE 0.95 01/20/2016 1047   CALCIUM  8.9  11/02/2023 1243   PROT 6.9 11/02/2023 1243   PROT 7.0 02/27/2022 0954    ALBUMIN  3.2 (L) 11/02/2023 1243   ALBUMIN  4.1 02/27/2022 0954   AST 13 (L) 11/02/2023 1243   ALT 10 11/02/2023 1243   ALKPHOS 92 11/02/2023 1243   BILITOT 0.8 11/02/2023 1243   BILITOT 0.6 02/27/2022 0954   GFR 84.58 03/07/2021 1145   EGFR 88 02/27/2022 0954   GFRNONAA >60 11/02/2023 1243    GFR Estimated Creatinine Clearance: 47.5 mL/min (by C-G formula based on SCr of 1.1 mg/dL). No results for input(s): LIPASE, AMYLASE in the last 72 hours. No results for input(s): CKTOTAL, CKMB, CKMBINDEX, TROPONINI in the last 72 hours. Invalid input(s): POCBNP No results for input(s): DDIMER in the last 72 hours. Recent Labs    11/03/23 0442  HGBA1C 5.2   Recent Labs    11/03/23 0442  CHOL 174  HDL 36*  LDLCALC 122*  TRIG 80  CHOLHDL 4.8   No results for input(s): TSH, T4TOTAL, T3FREE, THYROIDAB in the last 72 hours.  Invalid input(s): FREET3 No results for input(s): VITAMINB12, FOLATE, FERRITIN, TIBC, IRON, RETICCTPCT in the last 72 hours. Coags: Recent Labs    11/02/23 1243  INR 1.1   Microbiology: No results found for this or any previous visit (from the past 240 hours).  FURTHER DISCHARGE INSTRUCTIONS:  Get Medicines reviewed and adjusted: Please take all your medications with you for your next visit with your Primary MD  Laboratory/radiological data: Please request your Primary MD to go over all hospital tests and procedure/radiological results at the follow up, please ask your Primary MD to get all Hospital records sent to his/her office.  In some cases, they will be blood work, cultures and biopsy results pending at the time of your discharge. Please request that your primary care M.D. goes through all the records of your hospital data and follows up on these results.  Also Note the following: If you experience worsening of your admission symptoms, develop shortness of breath, life threatening emergency, suicidal or homicidal  thoughts you must seek medical attention immediately by calling 911 or calling your MD immediately  if symptoms less severe.  You must read complete instructions/literature along with all the possible adverse reactions/side effects for all the Medicines you take and that have been prescribed to you. Take any new Medicines after you have completely understood and accpet all the possible adverse reactions/side effects.   Do not drive when taking Pain medications or sleeping medications (Benzodaizepines)  Do not take more than prescribed Pain, Sleep and Anxiety Medications. It is not advisable to combine anxiety,sleep and pain medications without talking with your primary care practitioner  Special Instructions: If you have smoked or chewed Tobacco  in the last 2 yrs please stop smoking, stop any regular Alcohol  and or any Recreational drug use.  Wear Seat belts while driving.  Please note: You were cared for by a hospitalist during your hospital stay. Once you are discharged, your primary care physician will handle any further medical issues. Please note that NO REFILLS for any discharge medications will be authorized once you are discharged, as it is imperative that you return to your primary care physician (or establish a relationship with a primary care physician if you do not have one) for your post hospital discharge needs so that they can reassess your need for medications and monitor your lab values.  Total Time spent coordinating discharge including counseling, education and face to face  time equals greater than 30 minutes.  SignedBETHA Donalda Applebaum 11/04/2023 9:24 AM

## 2023-11-04 NOTE — TOC CAGE-AID Note (Signed)
 Transition of Care [facility]) - CAGE-AID Screening   Patient Details  Name: [name] MRN: [id number] Date of Birth: 1940-10-10  Transition of Care [facility]) CM/SW Contact:    Joon Pohle E Sho Salguero, LCSW Phone Number: 11/04/2023, 10:26 AM   Clinical Narrative: No SA noted.   CAGE-AID Screening:    Have You Ever Felt You Ought to Cut Down on Your Drinking or Drug Use?: No Have People Annoyed You By Critizing Your Drinking Or Drug Use?: No Have You Felt Bad Or Guilty About Your Drinking Or Drug Use?: No Have You Ever Had a Drink or Used Drugs First Thing In The Morning to Steady Your Nerves or to Get Rid of a Hangover?: No CAGE-AID Score: 0  Substance Abuse Education Offered: No

## 2023-11-04 NOTE — Progress Notes (Signed)
 Ordering 30 day monitor at request of neurology for stroke. Dr. Wonda to read

## 2023-11-04 NOTE — Progress Notes (Signed)
 STROKE TEAM PROGRESS NOTE    SIGNIFICANT HOSPITAL EVENTS 9/13: presented post-syncope with dysarthria and chest pain.  MRI: right putamen nucleus acute infarct, multiple chronic infarcts and micro hemorrhages suggesting vasculitis  INTERIM HISTORY/SUBJECTIVE No acute event overnight.  Patient neuro stable.  EEG no seizure.  EKG yesterday showed frequent PVCs, no A-fib.  However recommend 30-day cardiac event monitor as outpatient.  OBJECTIVE  CBC    Component Value Date/Time   WBC 9.0 11/02/2023 1243   RBC 4.75 11/02/2023 1243   HGB 13.6 11/02/2023 1248   HGB 14.8 02/27/2022 0954   HCT 40.0 11/02/2023 1248   HCT 44.7 02/27/2022 0954   PLT 234 11/02/2023 1243   PLT 244 02/27/2022 0954   MCV 88.0 11/02/2023 1243   MCV 88 02/27/2022 0954   MCH 28.6 11/02/2023 1243   MCHC 32.5 11/02/2023 1243   RDW 14.0 11/02/2023 1243   RDW 14.8 02/27/2022 0954   LYMPHSABS 1.9 11/02/2023 1243   LYMPHSABS 2.5 05/25/2020 1607   MONOABS 0.4 11/02/2023 1243   EOSABS 0.4 11/02/2023 1243   EOSABS 0.5 (H) 05/25/2020 1607   BASOSABS 0.1 11/02/2023 1243   BASOSABS 0.1 05/25/2020 1607    BMET    Component Value Date/Time   NA 139 11/02/2023 1248   NA 143 02/27/2022 0954   K 3.7 11/02/2023 1248   CL 102 11/02/2023 1248   CO2 24 11/02/2023 1243   GLUCOSE 130 (H) 11/02/2023 1248   BUN 9 11/02/2023 1248   BUN 15 02/27/2022 0954   CREATININE 1.10 11/02/2023 1248   CREATININE 0.95 01/20/2016 1047   CALCIUM  8.9 11/02/2023 1243   EGFR 88 02/27/2022 0954   GFRNONAA >60 11/02/2023 1243    IMAGING past 24 hours EEG adult Result Date: 11/04/2023 Gerald Arlin KIDD, MD     11/04/2023  8:34 AM Patient Name: Gerald Gerald MRN: [id number] Epilepsy Attending: Arlin Foster Gerald Referring Physician/Provider: Jerri Pfeiffer, MD Date: 11/03/2023 Duration: 22.25 mins Patient history:  83 y.o. male with history of CAD with CABGx4, PE previously on eliquis  who presented post-syncope. EEG to evaluate for seizure Level  of alertness: Awake AEDs during EEG study: None Technical aspects: This EEG study was done with scalp electrodes positioned according to the 10-20 International system of electrode placement. Electrical activity was reviewed with band pass filter of 1-70Hz , sensitivity of 7 uV/mm, display speed of 59mm/sec with a 60Hz  notched filter applied as appropriate. EEG data were recorded continuously and digitally stored.  Video monitoring was available and reviewed as appropriate. Description: The posterior dominant rhythm consists of 8 Hz activity of moderate voltage (25-35 uV) seen predominantly in posterior head regions, symmetric and reactive to eye opening and eye closing. EEG showed intermittent generalized 3 to 6 Hz theta-delta slowing. Hyperventilation and photic stimulation were not performed.   ABNORMALITY - Intermittent slow, generalized IMPRESSION: This study is suggestive of mild diffuse encephalopathy. No seizures or epileptiform discharges were seen throughout the recording. Gerald Gerald    Vitals:   11/03/23 1600 11/03/23 2329 11/04/23 0400 11/04/23 0825  BP: 135/81 (!) 156/101 105/62 (!) 144/74  Pulse: 64  (!) 51 (!) 59  Resp:    18  Temp:  97.8 F (36.6 C) 98.3 F (36.8 C) (!) 97.3 F (36.3 C)  TempSrc:  Oral Oral Oral  SpO2:  96% 96% 97%  Weight:      Height:         PHYSICAL EXAM General:  Alert, well-nourished, well-developed patient in no acute  distress Psych:  somewhat fidgety and anxious CV: Regular rate and rhythm on monitor Respiratory:  Regular, unlabored respirations on room air   NEURO:  Mental Status: AA. Disoriented to month. Patient is able to give clear and coherent history Speech/Language: speech is without dysarthria or aphasia.  Naming, repetition, fluency, and comprehension intact.  Cranial Nerves:  II: PERRL. Visual fields full.  III, IV, VI: EOMI. Eyelids elevate symmetrically.  V: Sensation is intact to light touch and symmetrical to face.  VII:  Face is symmetrical resting and smiling VIII: hearing intact to voice. IX, X: Palate elevates symmetrically. Phonation is normal.  KP:Dynloizm shrug 5/5. XII: tongue is midline without fasciculations. Motor:  LLE: 4+/5, no drift Tone: is normal and bulk is normal Sensation-decreased to LLE Coordination: FTN intact bilaterally, HKS: no ataxia in BLE.No drift.  Gait- deferred  Most Recent NIH: 2     ASSESSMENT/PLAN  Mr. Gerald Gerald is a 83 y.o. male with history of CAD with CABGx4, PE previously on eliquis  who presented post-syncope with dysarthria and chest pain. Imaging revealed right putamen nucleus acute infarct, multiple chronic infarcts and micro hemorrhages suggesting vasculitis, admitted for full stroke workup.  NIH on Admission 1.  Stroke:  right lateral putamen small infarct, etiology likely small vessel disease  Code Stroke CT head No acute abnormality. Small vessel disease. ASPECTS 10.    CTA head & neck Negative for large vessel occlusion. Moderate LICA siphon stenosis appears progressed since 2018. Moderate Right siphon stenosis appears stable. Left ACA A2 segment Moderate stenosis is increased. Moderate Left PCA P2 stenosis appears increased MRI  Acute 10 mm nonhemorrhagic infarct in the lateral right putamen nucleus with associated T2 and FLAIR signal. Remote lacunar infarcts in the thalami bilaterally, right paramedian pons, and left cerebellum. CT angio chest PE: Negative for acute PE. Positive for small chronic right lower lobe PE, nonocclusive and stable since 2023 2D Echo: EF 60 to 65%, grade 1 diastolic dysfunction, trivial AVR LDL 122 HgbA1c 5.2 UDS neg VTE prophylaxis - SCDs ASA 81 daily prior to admission, now on aspirin  81 and plavix  DAPT for 3 weeks and then plavix  alone Therapy recommendations:  No follow up needed  Disposition:  home today  Hx of Stroke 04/2016: Dorsal pontine infarct secondary to small vessel disease source. CTA head and neck b/l  siphon atherosclerosis. EF 60-65%. LDL 74 and A1C 5.7. Discharged on DAPT and zocor  20  ? Seizure like activity Per family report, pt was in church and had sudden onset slump to the left, staring off, not responding, b/l hand mildly shaking, lasted about several minutes. On EMS arrival, pt was able to speak but slurry and not able to recall what happened. In ER, was reported some confusion and agitation. Pt back to baseline about one hour. No hx of seizure EEG mild diffuse encephalopathy, no seizure No AEDs needed at this time Outpt follow up with neurology  Hypertension Home meds:  Coreg  12.5mg  BID, Norvasc  5mg  daily, Valsartan  160mg  daily BP Stable now Blood pressure goal normotensive. Avoid low BP  Hyperlipidemia Home meds: none LDL 122, goal < 70 Now on crestor  20 Continue statin at discharge  Other Stroke Risk Factors Advanced Age CAD s/p CABG x4, follows with Dr. Wonda cardiology Hx of PE 12/2021, now repeat CTA showed chronic PE, off eliquis   Other medical issue Arrhythmia on tele 9/14 - EKG stat frequent PVCs --recommend 30-day cardiac event monitoring as outpatient to rule out A-fib or other arrhythmia  Hospital  day # 2  Neurology will sign off. Please call with questions. Pt will follow up with stroke clinic NP at [facility] in about 4 weeks. Thanks for the consult.    Gerald Cummins, MD PhD Stroke Neurology 11/04/2023 1:31 PM     To contact Stroke Continuity provider, please refer to [email or web address]. After hours, contact General Neurology

## 2023-11-04 NOTE — Progress Notes (Signed)
 Discharge instructions completed and pt verbally understood all discharge POC .  Patient refused Spanish interpreter and states that he understands English well.  Patient appropriated understands discharge with feedback and teachback instructions.  Bed monitor EKG removed/CCMDShea.  No PIV during discharge.  Transferring to Lounge.

## 2023-11-05 ENCOUNTER — Telehealth: Payer: Self-pay | Admitting: *Deleted

## 2023-11-05 NOTE — Transitions of Care (Post Inpatient/ED Visit) (Signed)
   11/05/2023  Name: Damel Querry MRN: [id number] DOB: 27-Feb-1940  Today's TOC FU Call Status: Today's TOC FU Call Status:: Unsuccessful Call (1st Attempt) Unsuccessful Call (1st Attempt) Date: 11/05/23  Attempted to reach the patient regarding the most recent Inpatient visit.  Interpreter services left HIPAA compliant voice message   Follow Up Plan: Additional outreach attempts will be made to reach the patient to complete the Transitions of Care (Post Inpatient/ED visit) call.   Pls call/ message for questions,  Zabella Wease Mckinney Orville Widmann, RN, BSN, CCRN Alumnus RN Care Manager  Transitions of Care  [facility] - [facility] [phone]: direct office

## 2023-11-06 ENCOUNTER — Telehealth: Payer: Self-pay | Admitting: *Deleted

## 2023-11-06 NOTE — Transitions of Care (Post Inpatient/ED Visit) (Signed)
   11/06/2023  Name: [name] MRN: [id number] DOB: [birth year]  Today's TOC FU Call Status: Today's TOC FU Call Status:: Unsuccessful Call (2nd Attempt) Unsuccessful Call (2nd Attempt) Date: 11/06/23  Attempted to reach the patient regarding the most recent Inpatient/ED visit.  Follow Up Plan: Additional outreach attempts will be made to reach the patient to complete the Transitions of Care (Post Inpatient/ED visit) call.   Andrea Dimes RN, BSN [place]  [facility] RN Care Manager [phone]

## 2023-11-07 ENCOUNTER — Telehealth: Payer: Self-pay | Admitting: *Deleted

## 2023-11-07 NOTE — Transitions of Care (Post Inpatient/ED Visit) (Signed)
 11/07/2023  Name: [name] MRN: [id number] DOB: 1940-10-04  Today's TOC FU Call Status: Today's TOC FU Call Status:: Successful TOC FU Call Completed TOC FU Call Complete Date: 11/07/23 Patient's Name and Date of Birth NOT confirmed.  Male person answered phone and reports she is patient's daughter, [name]- confirmed this person is not included on current DPR; asked that daughter take phone to patient so he can provide HIPAA identifiers and verbal consent to speak with her   Transition Care Management Follow-up Telephone Call Date of Discharge: 11/04/23 Discharge Facility: [facility]) Type of Discharge: Inpatient Admission Primary Inpatient Discharge Diagnosis:: Acute CVA with syncopal episode Any questions or concerns?: Yes Patient Questions/Concerns:: Male person that answered the phone reports she spoke with another nurse yesterday and I already told her that he is not going to talk to y'all- and my mother can't talk to you- she has dementia- why didn't she tell me all of this yesterday? Patient Questions/Concerns Addressed: Other: (Asked that daughter take phone to patient so he can provide HIPAA identifiers and verbal consent to speak with her- she reports unable: Talking on phone makes him nervous; Provided education around purpose of DPR/ process to update DPR)  Male person identifying herself as [name]/ patient's daughter very upset by third TOC call today: Reports, I got a call from a nurse yesterday--- she just would not talk to me--- why didn't she tell me all of this that you are telling me now?  She just said someone would call again later- she refused to talk to me at all- I told her my Dad can't take the phone calls because calls make him nervous, and my mother has dementia. I told her that today would be just like yesterday was- he will not talk, but I will, and that there was no point in having anyone else call me again if they can't talk to me!  Also-- we  don't really need a Spanish interpreter--- we all speak English  Provided education/ explained purpose of TOC call and why HIPAA/ DPR specifications on file must be complied with; as well language alerts which were verified at outset of call Explained need for prompt hospital follow up PCP office visit: she reports she will call to schedule Purpose of/ and importance of having updated DPR completed-- provided education around process of updating DPR encouraged her to update at time of upcoming PCP appointment once it is scheduled Process to take to have language preferences updated- she will discuss with her father (patient) and decide if they wish to keep Spanish or change to [place] Service recovery provided around lack of communication after yesterday's second attempt, which was simply marked as unsuccessful without additional context  Unable to complete TOC assessments/ call as per above: extensive amount of time spent in services recovery and provision of information/ education- as per documentation  Items Reviewed:    Medications Reviewed Today: Medications Reviewed Today     Reviewed by Dolores Ewing M, RN (Registered Nurse) on 11/07/23 at 1458  Med List Status: <None>   Medication Order Taking? Sig Documenting Provider Last Dose Status Informant  aspirin  EC 81 MG tablet [id number]  Take 1 tablet (81 mg total) by mouth daily for 21 days. Swallow whole. Raenelle Donalda HERO, MD  Active   carvedilol  (COREG ) 6.25 MG tablet [id number]  Take 1 tablet (6.25 mg total) by mouth 2 (two) times daily with a meal. Ghimire, Donalda HERO, MD  Active   clopidogrel  (PLAVIX ) 75  MG tablet [id number]  Take 1 tablet (75 mg total) by mouth daily. Raenelle Donalda HERO, MD  Active   rosuvastatin  (CRESTOR ) 40 MG tablet [id number]  Take 1 tablet (40 mg total) by mouth daily. Raenelle Donalda HERO, MD  Active   valsartan  (DIOVAN ) 160 MG tablet [id number]  Take 1 tablet (160 mg total) by mouth daily. Raenelle Donalda HERO, MD   Active             Home Care and Equipment/Supplies:    Functional Questionnaire:    Follow up appointments reviewed:      Pls call/ message for questions,  Jozie Wulf Mckinney Atziri Zubiate, RN, BSN, CCRN Alumnus RN Care Manager  Transitions of Care  [facility] - [facility] [phone]: direct office

## 2023-11-08 ENCOUNTER — Ambulatory Visit: Attending: Cardiovascular Disease | Admitting: Cardiovascular Disease

## 2023-11-08 ENCOUNTER — Encounter: Payer: Self-pay | Admitting: Cardiovascular Disease

## 2023-11-08 VITALS — BP 110/70 | HR 58 | Ht 66.0 in | Wt 168.0 lb

## 2023-11-08 DIAGNOSIS — E782 Mixed hyperlipidemia: Secondary | ICD-10-CM

## 2023-11-08 DIAGNOSIS — I639 Cerebral infarction, unspecified: Secondary | ICD-10-CM | POA: Diagnosis not present

## 2023-11-08 DIAGNOSIS — I5032 Chronic diastolic (congestive) heart failure: Secondary | ICD-10-CM | POA: Diagnosis not present

## 2023-11-08 DIAGNOSIS — I10 Essential (primary) hypertension: Secondary | ICD-10-CM | POA: Diagnosis not present

## 2023-11-08 DIAGNOSIS — I2510 Atherosclerotic heart disease of native coronary artery without angina pectoris: Secondary | ICD-10-CM | POA: Diagnosis not present

## 2023-11-08 MED ORDER — CARVEDILOL 3.125 MG PO TABS: 3.1250 mg | ORAL_TABLET | Freq: Two times a day (BID) | ORAL | 3 refills | Status: AC

## 2023-11-08 NOTE — Assessment & Plan Note (Signed)
 No anginal symptoms.  On appropriate medical therapy with aspirin , high intensity statin drug, ARB, and beta-blocker.

## 2023-11-08 NOTE — Assessment & Plan Note (Signed)
 The patient was recently hospitalized with small vessel stroke.  He remains on aspirin , clopidogrel , and rosuvastatin .  A 30-day monitor was ordered but I am not sure that he is really able to navigate this.  He has not put it on yet.  Since his stroke appeared to be small vessel, I do not think he needs to be screened for atrial fibrillation at this time.  He had no A-fib detected during the hospital stay and we have not seen A-fib over the years and has close follow-up.

## 2023-11-08 NOTE — Assessment & Plan Note (Signed)
 His blood pressure appears to be running too low at home.  Will reduce carvedilol  to 3.125 mg twice daily.  If he continues to run on the low side, we will have to reduce valsartan  as well.  I advised him on the importance of adequate fluid hydration.  He is not drinking much water at home.

## 2023-11-08 NOTE — Patient Instructions (Signed)
 Medication Instructions:  DECREASE Carvedilol  (Coreg ) to 3.125 mg twice daily   *If you need a refill on your cardiac medications before your next appointment, please call your pharmacy*  Lab Work: None ordered today. If you have labs (blood work) drawn today and your tests are completely normal, you will receive your results only by: MyChart Message (if you have MyChart) OR A paper copy in the mail If you have any lab test that is abnormal or we need to change your treatment, we will call you to review the results.  Testing/Procedures: None ordered today.  Follow-Up: At [facility], you and your health needs are our priority.  As part of our continuing mission to provide you with exceptional heart care, our providers are all part of one team.  This team includes your primary Cardiologist (physician) and Advanced Practice Providers or APPs (Physician Assistants and Nurse Practitioners) who all work together to provide you with the care you need, when you need it.  Your next appointment:   6 month(s)  Provider:   Ozell Fell, MD    We recommend signing up for the patient portal called MyChart.  Sign up information is provided on this After Visit Summary.  MyChart is used to connect with patients for Virtual Visits (Telemedicine).  Patients are able to view lab/test results, encounter notes, upcoming appointments, etc.  Non-urgent messages can be sent to your provider as well.   To learn more about what you can do with MyChart, go to [email or web address].   Other Instructions You have been referred to [facility] for an assessment. Someone will be in contact with you in the near future.

## 2023-11-08 NOTE — Progress Notes (Signed)
 Cardiology Office Note:    Date:  11/08/2023   ID:  [name], DOB 11/29/1940, MRN [id number]  PCP:  Purcell [name], MD   [facility] Providers Cardiologist:  Ozell Fell, MD     Referring MD: Purcell [name], *   Chief Complaint  Patient presents with   Weakness    History of Present Illness:    [name] is a 83 y.o. male with a hx of CAD status post CABG, hypertension, chronic HFpEF, presenting for hospital follow-up evaluation.  The patient was hospitalized with an acute stroke after presenting with an acute right lateral small ischemic infarct felt to be secondary to small vessel disease.  The patient presented with dysarthria.  MRI showed multiple punctate foci in the thalami, cerebellum, brainstem, and subcortical white matter.  He reportedly had a syncopal episode.  The patient is here with his wife, daughter, and son today.  He was just discharged from the hospital few days ago.  He continues to have a difficult time.  His wife has advanced dementia and the 2 of them need a lot of help.  His son takes care of them on the weekends and evenings and the daughter is able to oversee things during the day is much as possible.  Today, he took his wife out in a car for breakfast, but upon returning home he was so weak that he had to lay down outside and was unable to get up.  His daughter could not pick him up because she has some back problems.  The patient denies chest pain or shortness of breath but has generally been feeling weak.  His blood pressure this morning was reportedly 78/62 but at 2 PM had improved to 118/62.  When I reviewed his blood pressures during his hospitalization he was consistently elevated in the 140s to 160s over 80s to 90s.   Current Medications: Current Meds  Medication Sig   aspirin  EC 81 MG tablet Take 1 tablet (81 mg total) by mouth daily for 21 days. Swallow whole.   clopidogrel  (PLAVIX ) 75 MG tablet Take 1 tablet  (75 mg total) by mouth daily.   rosuvastatin  (CRESTOR ) 40 MG tablet Take 1 tablet (40 mg total) by mouth daily.   valsartan  (DIOVAN ) 160 MG tablet Take 1 tablet (160 mg total) by mouth daily.   [DISCONTINUED] carvedilol  (COREG ) 6.25 MG tablet Take 1 tablet (6.25 mg total) by mouth 2 (two) times daily with a meal.     Allergies:   Lipitor [atorvastatin ], Morphine  and codeine, and Pork-derived products   ROS:   Please see the history of present illness.    All other systems reviewed and are negative.  EKGs/Labs/Other Studies Reviewed:    The following studies were reviewed today: Cardiac Studies & Procedures   ______________________________________________________________________________________________ CARDIAC CATHETERIZATION  CARDIAC CATHETERIZATION 04/16/2017  Conclusion  The left ventricular systolic function is normal.  LV end diastolic pressure is mildly elevated.  The left ventricular ejection fraction is 55-65% by visual estimate.  Prox RCA lesion is 50% stenosed.  Mid RCA lesion is 95% stenosed.  Dist RCA lesion is 50% stenosed with 90% stenosed side branch in Ost RPDA.  Dist LM to Ost LAD lesion is 90% stenosed.  Ost 1st Diag lesion is 100% stenosed.  Prox LAD lesion is 85% stenosed.  Mid LAD lesion is 80% stenosed.  Ost 1st Mrg to 1st Mrg lesion is 80% stenosed.  Ost Cx lesion is 90% stenosed.  Prox Cx lesion  is 50% stenosed.  1.  Critical left main and multivessel coronary artery disease with severe proximal LAD stenosis, severe ostial left circumflex stenosis and severe in-stent restenosis, and severe mid RCA stenosis 2.  Normal LV systolic function with mildly elevated LVEDP  Recommendations: The patient will be transferred to a cardiac stepdown bed.  IV heparin  will be restarted and he will continue on IV nitroglycerin .  A cardiac surgical consultation will be placed for urgent coronary bypass surgery.  The patient is chest pain-free at the completion  of the procedure but has critical multivessel disease.  Findings Coronary Findings Diagnostic  Dominance: Right  Left Main Dist LM to Ost LAD lesion is 90% stenosed. There is a critical lesion at the distal left main with 90% stenosis present.  Left Anterior Descending Prox LAD lesion is 85% stenosed. The proximal LAD has severe diffuse 80-85% stenosis extending to the first diagonal origin. Mid LAD lesion is 80% stenosed. There is a tight napkin ring stenosis in the distal LAD as well as diffuse disease throughout that region.  First Diagonal Branch Ost 1st Diag lesion is 100% stenosed. The first diagonal branch is totally occluded.  The vessel can be seen filling late from left to left collaterals.  Left Circumflex Ost Cx lesion is 90% stenosed. There is severe ostial circumflex stenosis, likely part of the distal left main complex plaque. Prox Cx lesion is 50% stenosed. The lesion was previously treated using a bare metal stent over 2 years ago.  First Obtuse Marginal Branch Ost 1st Mrg to 1st Mrg lesion is 80% stenosed. The lesion was previously treated using a bare metal stent over 2 years ago. There is severe in-stent restenosis in the mid circumflex stent that extends into the first obtuse marginal branch.  Right Coronary Artery Prox RCA lesion is 50% stenosed. Mid RCA lesion is 95% stenosed. There is diffuse disease in the mid RCA extending into a critical hypodense 95% stenosis. Dist RCA lesion is 50% stenosed with 90% stenosed side branch in Ost RPDA.  Inferior Septal The PDA is diffusely diseased after a severe stenosis at its ostium  Intervention  No interventions have been documented.   STRESS TESTS  NM MYOCAR MULTI W/SPECT W 08/09/2020  Narrative CLINICAL DATA:  Chest pain. History of previous median sternotomy and CABG.  EXAM: MYOCARDIAL IMAGING WITH SPECT (REST AND PHARMACOLOGIC-STRESS)  GATED LEFT VENTRICULAR WALL MOTION STUDY  LEFT VENTRICULAR  EJECTION FRACTION  TECHNIQUE: Standard myocardial SPECT imaging was performed after resting intravenous injection of 10 mCi Tc-57m tetrofosmin . Subsequently, intravenous infusion of Lexiscan  was performed under the supervision of the Cardiology staff. At peak effect of the drug, 30 mCi Tc-24m tetrofosmin  was injected intravenously and standard myocardial SPECT imaging was performed. Quantitative gated imaging was also performed to evaluate left ventricular wall motion, and estimate left ventricular ejection fraction.  COMPARISON:  Chest radiograph-08/08/2020; CT the chest, abdomen and pelvis-08/08/2020  FINDINGS: Raw images: Mild breast and chest wall attenuation is seen on both provided rest and stress images. Mild GI attenuation is seen, worse on the provided stress images. Mild patient motion artifact is seen, worse on the provided rest images.  Perfusion: No decreased activity in the left ventricle on stress imaging to suggest reversible ischemia or infarction.  Wall Motion: Normal left ventricular wall motion. No left ventricular dilation.  Left Ventricular Ejection Fraction: 58 %  End diastolic volume 70 ml  End systolic volume 29 ml  IMPRESSION: 1. No scintigraphic evidence of prior infarction or  pharmacologically induced ischemia.  2. Normal left ventricular wall motion.  3. Left ventricular ejection fraction 58%  4. Non invasive risk stratification*: Low  *2012 Appropriate Use Criteria for Coronary Revascularization Focused Update: J Am Coll Cardiol. 2012;59(9):857-881. http://content.dementiazones.com.aspx?articleid=1201161   Electronically Signed By: Norleen Roulette M.D. On: 08/09/2020 14:53   ECHOCARDIOGRAM  ECHOCARDIOGRAM COMPLETE 11/03/2023  Narrative ECHOCARDIOGRAM REPORT    Patient Name:   Martavis Gurney Date of Exam: 11/03/2023 Medical Rec #:  [id number]        Height:       66.0 in Accession #:    [id number]       Weight:       160.0  lb Date of Birth:  June 07, 1940        BSA:          1.819 m Patient Age:    83 years         BP:           149/97 mmHg Patient Gender: M                HR:           57 bpm. Exam Location:  Inpatient  Procedure: 2D Echo, Cardiac Doppler and Color Doppler (Both Spectral and Color Flow Doppler were utilized during procedure).  Indications:    Stroke I63.9  History:        Patient has prior history of Echocardiogram examinations, most recent 08/09/2021. CHF, CAD, Stroke, Signs/Symptoms:Chest Pain and Syncope; Risk Factors:Hypertension and Dyslipidemia.  Sonographer:    Thea Norlander RCS Referring Phys: ARY CUMMINS  IMPRESSIONS   1. Left ventricular ejection fraction, by estimation, is 60 to 65%. The left ventricle has normal function. The left ventricle has no regional wall motion abnormalities. Left ventricular diastolic parameters are consistent with Grade I diastolic dysfunction (impaired relaxation). 2. Right ventricular systolic function is normal. The right ventricular size is normal. Tricuspid regurgitation signal is inadequate for assessing PA pressure. 3. The mitral valve is normal in structure. No evidence of mitral valve regurgitation. No evidence of mitral stenosis. 4. The aortic valve is tricuspid. Aortic valve regurgitation is trivial. Aortic valve sclerosis/calcification is present, without any evidence of aortic stenosis. Aortic valve Vmax measures 1.16 m/s. 5. The inferior vena cava is normal in size with greater than 50% respiratory variability, suggesting right atrial pressure of 3 mmHg.  FINDINGS Left Ventricle: Left ventricular ejection fraction, by estimation, is 60 to 65%. The left ventricle has normal function. The left ventricle has no regional wall motion abnormalities. The left ventricular internal cavity size was normal in size. There is no left ventricular hypertrophy. Left ventricular diastolic parameters are consistent with Grade I diastolic dysfunction  (impaired relaxation). Normal left ventricular filling pressure.  Right Ventricle: The right ventricular size is normal. No increase in right ventricular wall thickness. Right ventricular systolic function is normal. Tricuspid regurgitation signal is inadequate for assessing PA pressure.  Left Atrium: Left atrial size was normal in size.  Right Atrium: Right atrial size was normal in size.  Pericardium: There is no evidence of pericardial effusion.  Mitral Valve: The mitral valve is normal in structure. Mild to moderate mitral annular calcification. No evidence of mitral valve regurgitation. No evidence of mitral valve stenosis.  Tricuspid Valve: The tricuspid valve is normal in structure. Tricuspid valve regurgitation is trivial. No evidence of tricuspid stenosis.  Aortic Valve: The aortic valve is tricuspid. Aortic valve regurgitation is trivial. Aortic valve sclerosis/calcification is present, without any  evidence of aortic stenosis. Aortic valve peak gradient measures 5.4 mmHg.  Pulmonic Valve: The pulmonic valve was normal in structure. Pulmonic valve regurgitation is not visualized. No evidence of pulmonic stenosis.  Aorta: The aortic root is normal in size and structure.  Venous: The inferior vena cava is normal in size with greater than 50% respiratory variability, suggesting right atrial pressure of 3 mmHg.  IAS/Shunts: No atrial level shunt detected by color flow Doppler.   LEFT VENTRICLE PLAX 2D LVIDd:         3.20 cm   Diastology LVIDs:         2.20 cm   LV e' medial:    6.74 cm/s LV PW:         1.00 cm   LV E/e' medial:  11.7 LV IVS:        1.10 cm   LV e' lateral:   6.20 cm/s LVOT diam:     2.00 cm   LV E/e' lateral: 12.8 LV SV:         55 LV SV Index:   30 LVOT Area:     3.14 cm   RIGHT VENTRICLE             IVC RV S prime:     12.90 cm/s  IVC diam: 1.50 cm TAPSE (M-mode): 1.1 cm  LEFT ATRIUM             Index        RIGHT ATRIUM           Index LA diam:         4.20 cm 2.31 cm/m   RA Area:     15.10 cm LA Vol (A2C):   45.1 ml 24.80 ml/m  RA Volume:   31.50 ml  17.32 ml/m LA Vol (A4C):   29.5 ml 16.22 ml/m LA Biplane Vol: 38.5 ml 21.17 ml/m AORTIC VALVE AV Area (Vmax): 2.02 cm AV Vmax:        115.70 cm/s AV Peak Grad:   5.4 mmHg LVOT Vmax:      74.40 cm/s LVOT Vmean:     49.800 cm/s LVOT VTI:       0.174 m  AORTA Ao Root diam: 3.50 cm Ao Asc diam:  3.50 cm  MITRAL VALVE MV Area (PHT): 2.91 cm     SHUNTS MV Decel Time: 261 msec     Systemic VTI:  0.17 m MV E velocity: 79.10 cm/s   Systemic Diam: 2.00 cm MV A velocity: 103.00 cm/s MV E/A ratio:  0.77  Wilbert Bihari MD Electronically signed by Wilbert Bihari MD Signature Date/Time: 11/03/2023/11:23:14 AM    Final   TEE  ECHO TEE 04/17/2017  Interpretation Summary  Septum: No Patent Foramen Ovale present.  Left atrium: Patent foramen ovale not present.  Aortic valve: The valve is trileaflet. No stenosis. Mild regurgitation.  Mitral valve: Trace regurgitation.  Right ventricle: Normal cavity size and ejection fraction.  Tricuspid valve: Trace regurgitation.      CARDIAC MRI  MR CARDIAC MORPHOLOGY W WO CONTRAST 08/30/2020  Narrative CLINICAL DATA:  Clinical question of pericardial mass 83 year old male  EXAM: CARDIAC MRI  TECHNIQUE: The patient was scanned on a 1.5 Tesla GE magnet. A dedicated cardiac coil was used. Functional imaging was done using [employer] sequences. 2,3, and 4 chamber views were done to assess for RWMA's. Modified Simpson's rule using a short axis stack was used to calculate an ejection fraction on a dedicated work  station using [employer]. The patient received 10 cc of Gadavist . After 10 minutes inversion recovery sequences were used to assess for infiltration and scar tissue.  CONTRAST:  10 cc  of Gadavist   FINDINGS: 1. Normal left ventricular size, with LVEDD 41 mm, and LVEDVi 61 mL/m2.  Normal left ventricular thickness,  with intraventricular septal thickness of 9 mm, posterior wall thickness of 10 mm.  Mild left ventricular systolic dysfunction (LVEF =49%). There are no regional wall motion abnormalities; mild global hypokinesis.  2. Normal right ventricular size with RVEDVI 46 mL/m2.  Normal right ventricular thickness.  Normal right ventricular systolic function (RVEF =56%). There are no regional wall motion abnormalities or aneurysms.  3.  Normal right atrial size and moderate left atrial dilation.  4. Normal size of the aortic root, ascending aorta and pulmonary artery.  5.  No significant valvular abnormalities.  6. There is a moderate (max dimension 17 mm) heterogeneous effusion overlying the lateral wall of the left ventricle (most prominent over the anterolateral base. There is intermediate but largely iso-intense native T1 signal intensity (1159 ms max average 1070 ms) with an intermediate to iso-intense T2 signal (65 ms at max, average 50 ms). There is evidence of contrast enhancement and pericardial thickening. No evidence of fat deposition based on dark blood assessment. These is an area of lack of enhancement best seen in the PSIR view.  7. There is evidence of sternotomy wires and both breath hold and wrap around artifact. Grossly, no other extracardiac findings. Recommended dedicated study if concerned for non-cardiac pathology.  IMPRESSION: Complex pericardial confluence as described above. Reaching out to primary MD.  Stanly Leavens MD   Electronically Signed By: Stanly Leavens MD On: 08/31/2020 08:19   ______________________________________________________________________________________________      EKG:        Recent Labs: 11/02/2023: ALT 10; BUN 9; Creatinine, Ser 1.10; Hemoglobin 13.6; Platelets 234; Potassium 3.7; Sodium 139  Recent Lipid Panel    Component Value Date/Time   CHOL 174 11/03/2023 0442   CHOL 105 08/14/2019 0956   TRIG 80  11/03/2023 0442   HDL 36 (L) 11/03/2023 0442   HDL 37 (L) 08/14/2019 0956   CHOLHDL 4.8 11/03/2023 0442   VLDL 16 11/03/2023 0442   LDLCALC 122 (H) 11/03/2023 0442   LDLCALC 50 08/14/2019 0956   LDLDIRECT 150.8 06/30/2008 1007     Risk Assessment/Calculations:                Physical Exam:    VS:  BP 110/70   Pulse (!) 58   Ht 5' 6 (1.676 m)   Wt 168 lb (76.2 kg)   SpO2 95%   BMI 27.12 kg/m     Wt Readings from Last 3 Encounters:  11/08/23 168 lb (76.2 kg)  11/02/23 160 lb (72.6 kg)  01/21/23 175 lb 3.2 oz (79.5 kg)     GEN:  Well nourished, well developed in no acute distress HEENT: Normal NECK: No JVD; No carotid bruits LYMPHATICS: No lymphadenopathy CARDIAC: RRR, no murmurs, rubs, gallops RESPIRATORY:  Clear to auscultation without rales, wheezing or rhonchi  ABDOMEN: Soft, non-tender, non-distended MUSCULOSKELETAL:  No edema; No deformity  SKIN: Warm and dry NEUROLOGIC:  Alert and oriented x 3 PSYCHIATRIC:  Normal affect   Assessment & Plan Acute CVA (cerebrovascular accident) [facility]) The patient was recently hospitalized with small vessel stroke.  He remains on aspirin , clopidogrel , and rosuvastatin .  A 30-day monitor was ordered but I am not sure  that he is really able to navigate this.  He has not put it on yet.  Since his stroke appeared to be small vessel, I do not think he needs to be screened for atrial fibrillation at this time.  He had no A-fib detected during the hospital stay and we have not seen A-fib over the years and has close follow-up. Essential hypertension His blood pressure appears to be running too low at home.  Will reduce carvedilol  to 3.125 mg twice daily.  If he continues to run on the low side, we will have to reduce valsartan  as well.  I advised him on the importance of adequate fluid hydration.  He is not drinking much water at home. Mixed hyperlipidemia Treated with rosuvastatin  40 mg.  Continue current management. Chronic diastolic  heart failure (HCC) No evidence of volume overload.  Recent echo shows normal LVEF 60 to 65%. Coronary artery disease involving native coronary artery of native heart without angina pectoris No anginal symptoms.  On appropriate medical therapy with aspirin , high intensity statin drug, ARB, and beta-blocker.  I had extensive discussion with the patient and his family members today about his difficult living situation.  I think they would benefit from a home health aide and some additional support if possible.  He would possibly benefit from physical therapy as well.  A referral for home health aide and home health PT will be made.     Medication Adjustments/Labs and Tests Ordered: Current medicines are reviewed at length with the patient today.  Concerns regarding medicines are outlined above.  Orders Placed This Encounter  Procedures   Ambulatory referral to Home Health   Meds ordered this encounter  Medications   carvedilol  (COREG ) 3.125 MG tablet    Sig: Take 1 tablet (3.125 mg total) by mouth 2 (two) times daily with a meal.    Dispense:  60 tablet    Refill:  3    Decreased dose to 3.125 mg BID    Patient Instructions  Medication Instructions:  DECREASE Carvedilol  (Coreg ) to 3.125 mg twice daily   *If you need a refill on your cardiac medications before your next appointment, please call your pharmacy*  Lab Work: None ordered today. If you have labs (blood work) drawn today and your tests are completely normal, you will receive your results only by: MyChart Message (if you have MyChart) OR A paper copy in the mail If you have any lab test that is abnormal or we need to change your treatment, we will call you to review the results.  Testing/Procedures: None ordered today.  Follow-Up: At [facility], you and your health needs are our priority.  As part of our continuing mission to provide you with exceptional heart care, our providers are all part of one team.   This team includes your primary Cardiologist (physician) and Advanced Practice Providers or APPs (Physician Assistants and Nurse Practitioners) who all work together to provide you with the care you need, when you need it.  Your next appointment:   6 month(s)  Provider:   Ozell Fell, MD    We recommend signing up for the patient portal called MyChart.  Sign up information is provided on this After Visit Summary.  MyChart is used to connect with patients for Virtual Visits (Telemedicine).  Patients are able to view lab/test results, encounter notes, upcoming appointments, etc.  Non-urgent messages can be sent to your provider as well.   To learn more about what you can  do with MyChart, go to [email or web address].   Other Instructions You have been referred to [facility] for an assessment. Someone will be in contact with you in the near future.     Signed, Ozell Fell, MD  11/08/2023 4:37 PM    [facility]

## 2023-11-08 NOTE — Assessment & Plan Note (Signed)
 No evidence of volume overload.  Recent echo shows normal LVEF 60 to 65%.

## 2023-11-11 ENCOUNTER — Ambulatory Visit: Admitting: Emergency Medicine

## 2023-11-11 ENCOUNTER — Encounter: Payer: Self-pay | Admitting: Emergency Medicine

## 2023-11-11 VITALS — BP 124/84 | HR 86 | Temp 98.0°F | Ht 66.0 in | Wt 170.0 lb

## 2023-11-11 DIAGNOSIS — I700 Atherosclerosis of aorta: Secondary | ICD-10-CM | POA: Diagnosis not present

## 2023-11-11 DIAGNOSIS — I5032 Chronic diastolic (congestive) heart failure: Secondary | ICD-10-CM

## 2023-11-11 DIAGNOSIS — I10 Essential (primary) hypertension: Secondary | ICD-10-CM

## 2023-11-11 DIAGNOSIS — Z09 Encounter for follow-up examination after completed treatment for conditions other than malignant neoplasm: Secondary | ICD-10-CM | POA: Diagnosis not present

## 2023-11-11 DIAGNOSIS — I2584 Coronary atherosclerosis due to calcified coronary lesion: Secondary | ICD-10-CM | POA: Diagnosis not present

## 2023-11-11 DIAGNOSIS — Z8673 Personal history of transient ischemic attack (TIA), and cerebral infarction without residual deficits: Secondary | ICD-10-CM

## 2023-11-11 DIAGNOSIS — E785 Hyperlipidemia, unspecified: Secondary | ICD-10-CM

## 2023-11-11 DIAGNOSIS — I2510 Atherosclerotic heart disease of native coronary artery without angina pectoris: Secondary | ICD-10-CM

## 2023-11-11 DIAGNOSIS — I110 Hypertensive heart disease with heart failure: Secondary | ICD-10-CM

## 2023-11-11 NOTE — Assessment & Plan Note (Signed)
Chronic stable condition. Continue rosuvastatin 40 mg daily

## 2023-11-11 NOTE — Assessment & Plan Note (Signed)
 No anginal symptoms.  On appropriate medical therapy with aspirin , high intensity statin drug, ARB, and beta-blocker.

## 2023-11-11 NOTE — Progress Notes (Signed)
 [name] 83 y.o.   Chief Complaint  Patient presents with   Hospitalization Follow-up    Patient here for HFU patient has stroke on 11/02/23. He did have a visit with his cardiologist last Friday. Patient is doing better today.     HISTORY OF PRESENT ILLNESS: This is a 83 y.o. male accompanied by family members here for follow-up of recent hospital admission Was able to follow-up with cardiologist last Friday. Overall feeling better.  Has no complaints or any other medical concerns today. Discharge summary as follows: PATIENT DETAILS Name: [name] Age: 83 y.o. Sex: male Date of Birth: 14-Feb-1941 MRN: [id number]. Admitting Physician: Editha Ram, MD ERE:[id number], [name], MD   Admit Date: 11/02/2023 Discharge date: 11/04/2023   Recommendations for Outpatient Follow-up:  Follow up with PCP in 1-2 weeks Please obtain CMP/CBC in one week Please ensure follow-up with cardiology-needs 30-day event monitor.   Admitted From:  Home   Disposition: Home   Discharge Condition: good   CODE STATUS:   Code Status: Full Code    Diet recommendation:  Diet Order                  Diet - low sodium heart healthy             Diet Heart Room service appropriate? Yes; Fluid consistency: Thin  Diet effective now                         Brief Summary: 83 year old with history of CAD s/p CABG, PE-completed a course of Eliquis  sometime in 2020 04/2022-who was at church when he sustained a what sounds like a syncopal episode without any prodrome.  He was brought to the ED-where further workup revealed acute CVA.   Brief Hospital Course: Acute right lateral predominant small ischemic infarct Likely secondary to small vessel disease CTA head/neck negative for LVO/significant stenosis-echo stable EF, LDL 122, A1c 5.2 Discussed with stroke MD-recommendations are for aspirin /Plavix  x 3 weeks then Plavix  alone   ?  Syncope Had a sudden onset of what sounds like  loss of consciousness while at church without any prodrome.  Per patient-he has had several similar episodes of syncope in the past. Telemetry negative for arrhythmias Echo with stable EF CTA chest negative for acute PE-Dopplers negative for DVT Discussed with cardiology-Dr. Toney O'Neal-recommendations are to pursue outpatient cardiac monitoring, cardiology coordinator contacted via epic requesting 30-day cardiac monitoring followed by follow-up with primary cardiologist. Patient advised not to drive or operate heavy machinery. Note-EEG was negative.  Not felt to have seizures-no AEDs recommended by neurology.   HTN Resume antihypertensives on discharge   HLD Continue statin on discharge   History of chronic small right lower lobe PE since 2019 Repeat CTA chest shows similar small/tiny right lower lobe PE-reviewed records-he has had the same lesion since 2019-this could just be organized scar tissue rather than PE at this point.  Spoke with pulmonology-Dr. Rolan Smith-since he is essentially asymptomatic-Dopplers are negative-echo with stable RV function-not felt to need treatment for this.  Furthermore he has already completed approximately 6 months of treatment somewhere in 2020 04/2022.   CAD s/p CABG 2019 Without any anginal symptoms. On DAPT/statin/beta-blocker.   Chronic HFpEF Euvolemic   History of known pericardial mass Followed by Dr. Napoleon observation and follow-up with Dr. Wonda.   Discharge Diagnoses:  Principal Problem:   Acute CVA (cerebrovascular accident) [facility]) Active Problems:   CAD (coronary artery disease)  Essential hypertension   Syncope   Chest pain   Last cardiologist office visit note as follows: Assessment & Plan Acute CVA (cerebrovascular accident) [facility]) The patient was recently hospitalized with small vessel stroke.  He remains on aspirin , clopidogrel , and rosuvastatin .  A 30-day monitor was ordered but I am not sure that he is really able to  navigate this.  He has not put it on yet.  Since his stroke appeared to be small vessel, I do not think he needs to be screened for atrial fibrillation at this time.  He had no A-fib detected during the hospital stay and we have not seen A-fib over the years and has close follow-up. Essential hypertension His blood pressure appears to be running too low at home.  Will reduce carvedilol  to 3.125 mg twice daily.  If he continues to run on the low side, we will have to reduce valsartan  as well.  I advised him on the importance of adequate fluid hydration.  He is not drinking much water at home. Mixed hyperlipidemia Treated with rosuvastatin  40 mg.  Continue current management. Chronic diastolic heart failure (HCC) No evidence of volume overload.  Recent echo shows normal LVEF 60 to 65%. Coronary artery disease involving native coronary artery of native heart without angina pectoris No anginal symptoms.  On appropriate medical therapy with aspirin , high intensity statin drug, ARB, and beta-blocker.  HPI   Prior to Admission medications   Medication Sig Start Date End Date Taking? Authorizing Provider  aspirin  EC 81 MG tablet Take 1 tablet (81 mg total) by mouth daily for 21 days. Swallow whole. 11/04/23 11/25/23 Yes Ghimire, Donalda HERO, MD  carvedilol  (COREG ) 3.125 MG tablet Take 1 tablet (3.125 mg total) by mouth 2 (two) times daily with a meal. 11/08/23  Yes [name], MD  clopidogrel  (PLAVIX ) 75 MG tablet Take 1 tablet (75 mg total) by mouth daily. 11/04/23  Yes Ghimire, Donalda HERO, MD  rosuvastatin  (CRESTOR ) 40 MG tablet Take 1 tablet (40 mg total) by mouth daily. 11/04/23  Yes Ghimire, Donalda HERO, MD  valsartan  (DIOVAN ) 160 MG tablet Take 1 tablet (160 mg total) by mouth daily. 11/05/23  Yes Ghimire, Donalda HERO, MD    Allergies  Allergen Reactions   Lipitor [Atorvastatin ] Shortness Of Breath and Other (See Comments)    Sores non head   Morphine  And Codeine Shortness Of Breath, Swelling and  Rash    Throat swelling   Pork-Derived Products Swelling and Rash    Tongue swelling No pork products -     Patient Active Problem List   Diagnosis Date Noted   Acute CVA (cerebrovascular accident) (HCC) 11/02/2023   Contusion of left hip 07/30/2022   Strain of lumbar region 07/30/2022   History of recent fall 07/30/2022   History of stroke 03/07/2021   Gait instability 09/20/2020   Chest pain 08/24/2020   Lumbar pain 08/24/2020   Aortic atherosclerosis 05/31/2020   Chronic diastolic heart failure (HCC) 02/14/2018   Syncope 11/04/2017   Essential hypertension 05/17/2016   pandiverticulosis 04/22/2012   Internal hemorrhoids 04/19/2012   Dyslipidemia 06/14/2008   Hypertensive heart disease 06/14/2008   CAD (coronary artery disease) 06/14/2008    Past Medical History:  Diagnosis Date   Blurred vision 05/17/2016   CAD (coronary artery disease), native coronary artery 06/14/2008   a. BMS to LCx & OM 2008 b. 03/2017 CABG x 4 (LIMA to LAD, SVG to diag 1, SVG to distal Circ, SVG to PDA)   Chest  pain 04/16/2012   Diplopia    Echocardiogram    Echo 10/19: mod LVH, EF 55-60, no RWMA, Gr 1 DD, trivial AI, MAC, mod LAE, prob small post effusion   Essential hypertension 05/17/2016   Hyperlipidemia with target low density lipoprotein (LDL) cholesterol less than 70 mg/dL 5/73/7989   Qualifier: Diagnosis of  By: [name], RMA, [name]     Hypertension    Hypertensive heart disease 06/14/2008   Qualifier: Diagnosis of  By: [name], RMA, [name]     Hypertensive urgency, malignant 04/16/2012   Hypokalemia 04/14/2017   Internal hemorrhoids 04/19/2012   Ischemic stroke (HCC) 05/02/2016   Non-ST elevation (NSTEMI) myocardial infarction [facility])    pandiverticulosis 04/22/2012   12/17/2011. [facility]. Renaye Sous MD. Colonoscopy. Moderate sized internal hemorrhoids and extensive pandiverticulosis. Repeat 5 years.     Pericardial effusion    Echo 11/19: mild focal basal septal hypertrophy,  EF 55-60, Gr 2 DD, trivial AI, trivial TR, trivial PI, small to mod eff post to heart - no evidence of RV collapse.>> repeat limited echo in 03/2018 // Echo 03/2018: EF 55-60, mild LVH, +diastolic dysfunction, no RWMA, PASP 23, trivial pericardial effusion    Polycythemia vera(238.4) 04/17/2012   S/P CABG x 4 04/17/2017   Syncope and collapse 04/17/2012   Pt syncopized while sitting in bed giving history @ time of admission to hospital Tachycardic (appeared sinus) to 120s-130s and hypotensive to 50s/30s.  Unresponsive initially >> Spontaneously resolved after 2-3 minutes >> return to baseline ~30 minutes    Vertigo     Past Surgical History:  Procedure Laterality Date   CORONARY ARTERY BYPASS GRAFT N/A 04/17/2017   Procedure: CORONARY ARTERY BYPASS GRAFTING (CABG) x4 , using left internal mammary artery  to LAD and right leg greater saphenous vein harvested endoscopically  to PDA, Diagonal I and Circumflex;  Surgeon: [name], MD;  Location: [facility] OR;  Service: Open Heart Surgery;  Laterality: N/A;   CORONARY ARTERY BYPASS GRAFT  2020   CORONARY STENT PLACEMENT     LEFT HEART CATH AND CORONARY ANGIOGRAPHY N/A 04/16/2017   Procedure: LEFT HEART CATH AND CORONARY ANGIOGRAPHY;  Surgeon: [name], MD;  Location: [facility] INVASIVE CV LAB;  Service: Cardiovascular;  Laterality: N/A;   TEE WITHOUT CARDIOVERSION N/A 04/17/2017   Procedure: TRANSESOPHAGEAL ECHOCARDIOGRAM (TEE);  Surgeon: [name], MD;  Location: [facility] OR;  Service: Open Heart Surgery;  Laterality: N/A;    Social History   Socioeconomic History   Marital status: Married    Spouse name: Not on file   Number of children: Not on file   Years of education: Not on file   Highest education level: Not on file  Occupational History   Not on file  Tobacco Use   Smoking status: Never   Smokeless tobacco: Never  Vaping Use   Vaping status: Never Used  Substance and Sexual Activity   Alcohol use: No    Alcohol/week: 0.0  standard drinks of alcohol   Drug use: No   Sexual activity: Not on file  Other Topics Concern   Not on file  Social History Narrative   Lives in [place] with Wife and 2 sons.  From [place].  To [place].     Currently retired but worked in Theatre manager   Social Drivers of Corporate investment banker Strain: High Risk (09/24/2022)   Overall Financial Resource Strain (CARDIA)    Difficulty of Paying Living Expenses: Very hard  Food  Insecurity: No Food Insecurity (09/24/2022)   Hunger Vital Sign    Worried About Running Out of Food in the Last Year: Never true    Ran Out of Food in the Last Year: Never true  Transportation Needs: No Transportation Needs (09/24/2022)   PRAPARE - Administrator, Civil Service (Medical): No    Lack of Transportation (Non-Medical): No  Physical Activity: Inactive (09/24/2022)   Exercise Vital Sign    Days of Exercise per Week: 0 days    Minutes of Exercise per Session: 0 min  Stress: No Stress Concern Present (09/24/2022)   [employer] of Occupational Health - Occupational Stress Questionnaire    Feeling of Stress : Not at all  Social Connections: Socially Integrated (09/24/2022)   Social Connection and Isolation Panel    Frequency of Communication with Friends and Family: More than three times a week    Frequency of Social Gatherings with Friends and Family: Three times a week    Attends Religious Services: More than 4 times per year    Active Member of Clubs or Organizations: No    Attends Banker Meetings: More than 4 times per year    Marital Status: Married  Catering manager Violence: Not At Risk (09/24/2022)   Humiliation, Afraid, Rape, and Kick questionnaire    Fear of Current or Ex-Partner: No    Emotionally Abused: No    Physically Abused: No    Sexually Abused: No    Family History  Problem Relation Age of Onset   Heart disease Mother    Cancer Sister      Review of Systems  Constitutional:  Negative.  Negative for chills and fever.  HENT: Negative.  Negative for congestion and sore throat.   Respiratory: Negative.  Negative for cough and shortness of breath.   Cardiovascular: Negative.  Negative for chest pain and palpitations.  Gastrointestinal:  Negative for abdominal pain, diarrhea, nausea and vomiting.  Genitourinary: Negative.  Negative for dysuria and hematuria.  Skin: Negative.  Negative for rash.  Neurological: Negative.  Negative for dizziness and headaches.    Vitals:   11/11/23 1435  BP: 124/84  Pulse: 86  Temp: 98 F (36.7 C)  SpO2: 97%    Physical Exam Vitals reviewed.  Constitutional:      Appearance: Normal appearance.  HENT:     Head: Normocephalic.     Mouth/Throat:     Mouth: Mucous membranes are moist.     Pharynx: Oropharynx is clear.  Eyes:     Extraocular Movements: Extraocular movements intact.     Conjunctiva/sclera: Conjunctivae normal.     Pupils: Pupils are equal, round, and reactive to light.  Cardiovascular:     Rate and Rhythm: Normal rate and regular rhythm.     Pulses: Normal pulses.     Heart sounds: Normal heart sounds.  Pulmonary:     Effort: Pulmonary effort is normal.     Breath sounds: Normal breath sounds.  Abdominal:     Palpations: Abdomen is soft.     Tenderness: There is no abdominal tenderness.  Musculoskeletal:     Cervical back: No tenderness.  Lymphadenopathy:     Cervical: No cervical adenopathy.  Skin:    General: Skin is warm and dry.     Capillary Refill: Capillary refill takes less than 2 seconds.  Neurological:     General: No focal deficit present.     Mental Status: He is alert and oriented to person,  place, and time.  Psychiatric:        Mood and Affect: Mood normal.        Behavior: Behavior normal.      ASSESSMENT & PLAN: A total of 43 minutes was spent with the patient and counseling/coordination of care regarding preparing for this visit, review of most recent office visit notes,  review of most recent hospital discharge summary, review of most recent cardiologist office visit notes, review of multiple chronic medical conditions and their management, review of all medications, review of most recent bloodwork results, review of health maintenance items, education on nutrition, prognosis, documentation, and need for follow up.   Problem List Items Addressed This Visit       Cardiovascular and Mediastinum   Hypertensive heart disease (Chronic)   Well-controlled hypertension No signs or symptoms of congestive heart failure      Relevant Orders   AMB Referral [facility] Care Management   CAD (coronary artery disease)   No anginal symptoms. On appropriate medical therapy with aspirin , high intensity statin drug, ARB, and beta-blocker.       Relevant Orders   AMB Referral [facility] Care Management   Essential hypertension - Primary   BP Readings from Last 3 Encounters:  11/11/23 124/84  11/08/23 110/70  11/04/23 (!) 144/74  Well-controlled hypertension Carvedilol  was recently reduced to 3.125 mg twice a day Continues valsartan  160 mg daily       Relevant Orders   AMB Referral [facility] Care Management   Chronic diastolic heart failure (HCC)   No evidence of volume overload. Recent echo shows normal LVEF 60 to 65%.       Relevant Orders   AMB Referral [facility] Care Management   Aortic atherosclerosis   Stable.  Diet and nutrition discussed. Continue rosuvastatin  40 mg daily.      Relevant Orders   AMB Referral [facility] Care Management     Other   Dyslipidemia   Chronic stable condition. Continue rosuvastatin  40 mg daily      Relevant Orders   AMB Referral [facility] Care Management   History of stroke   Secondary stroke prevention measures discussed Well-controlled hypertension Not diabetic On rosuvastatin  40 mg daily On aspirin  and Plavix       Relevant Orders   AMB Referral [facility] Care Management   Other Visit Diagnoses       Hospital discharge follow-up             Patient Instructions  Mantenimiento de la salud despus de los 65 aos de edad Health Maintenance After Age 45 Despus de los 65 aos de edad, corre un riesgo mayor de Film/video editor enfermedades e infecciones a largo plazo, como tambin de sufrir lesiones por cadas. Las cadas son la causa principal de las fracturas de huesos y lesiones en la cabeza de personas mayores de 65 aos de edad. Recibir cuidados preventivos de forma regular puede ayudarlo a mantenerse saludable y en buen [place]. Los cuidados preventivos incluyen realizarse anlisis de forma regular y Forensic psychologist en el estilo de vida segn las recomendaciones del mdico. Converse con el mdico sobre lo siguiente: Las pruebas de deteccin y los anlisis que debe International aid/development worker. Una prueba de deteccin es un estudio que se para Engineer, manufacturing la presencia de una enfermedad cuando no tiene sntomas. Un plan de dieta y ejercicios adecuado para usted. Qu debo saber sobre las pruebas de deteccin y los anlisis para prevenir cadas? Realizarse pruebas de deteccin y anlisis es la mejor manera de detectar  un problema de salud de forma temprana. El diagnstico y tratamiento tempranos le brindan la mejor oportunidad de Chief Operating Officer las afecciones mdicas que son comunes despus de los 65 aos de edad. Ciertas afecciones y elecciones de estilo de vida pueden hacer que sea ms propenso a sufrir Engineer, site. El mdico puede recomendarle lo siguiente: Controles regulares de la visin. Una visin deficiente y afecciones como las cataratas pueden hacer que sea ms propenso a sufrir Engineer, site. Si usa  lentes, asegrese de obtener una receta actualizada si su visin cambia. Revisin de medicamentos. Revise regularmente con el mdico todos los medicamentos que toma, incluidos los medicamentos de [place]. Consulte al [employer] efectos secundarios que pueden hacer que sea ms propenso a sufrir Engineer, site. Informe al mdico si alguno de los medicamentos  que toma lo hace sentir mareado o somnoliento. Controles de fuerza y equilibrio. El mdico puede recomendar ciertos estudios para controlar su fuerza y equilibrio al estar de pie, al caminar o al cambiar de posicin. Examen de los pies. El dolor y Materials engineer en los pies, como tambin no utilizar el calzado adecuado, pueden hacer que sea ms propenso a sufrir Engineer, site. Pruebas de deteccin, que incluyen las siguientes: Pruebas de deteccin para la osteoporosis. La osteoporosis es una afeccin que hace que los huesos se tornen ms dbiles y se quiebren con ms facilidad. Pruebas de deteccin para la presin arterial. Los cambios en la presin arterial y los medicamentos para Chief Operating Officer la presin arterial pueden hacerlo sentir mareado. Prueba de deteccin de la depresin. Es ms probable que sufra una cada si tiene miedo a caerse, se siente deprimido o se siente incapaz de Probation officer. Prueba de deteccin de consumo de alcohol. Beber demasiado alcohol puede afectar su equilibrio y puede hacer que sea ms propenso a sufrir Engineer, site. Siga estas indicaciones en su casa: Estilo de vida No beba alcohol si: Su mdico le indica no hacerlo. Si bebe alcohol: Limite la cantidad que bebe a lo siguiente: De 0 a 1 medida por da para las mujeres. De 0 a 2 medidas por da para los hombres. Sepa cunta cantidad de alcohol hay en las bebidas que toma. En los [address], una medida equivale a una botella de cerveza de 12 oz (355 ml), un vaso de vino de 5 oz (148 ml) o un vaso de una bebida alcohlica de alta graduacin de 1 oz (44 ml). No consuma ningn producto que contenga nicotina o tabaco. Estos productos incluyen cigarrillos, tabaco para Theatre manager y aparatos de vapeo, como los cigarrillos electrnicos. Si necesita ayuda para dejar de consumir estos productos, consulte al [employer]. Actividad  Siga un programa de ejercicio regular para mantenerse en forma. Esto lo ayudar a  Radio producer equilibrio. Consulte al mdico qu tipos de ejercicios son adecuados para usted. Si necesita un bastn o un andador, selo segn las recomendaciones del mdico. Utilice calzado con buen apoyo y suela antideslizante. Seguridad  Retire los [employer] puedan causar tropiezos tales como alfombras, cables u obstculos. Instale equipos de seguridad, como barras para sostn en los baos y barandas de seguridad en las escaleras. Mantenga las habitaciones y los pasillos bien iluminados. Indicaciones generales Hable con el mdico sobre sus riesgos de sufrir una cada. Infrmele a su mdico si: Se cae. Asegrese de informarle a su mdico acerca de todas las cadas, incluso aquellas que parecen ser [employer]. Se siente mareado, cansado (tiene fatiga) o siente que pierde el equilibrio. Use los medicamentos  de venta libre y los recetados solamente como se lo haya indicado el mdico. Estos incluyen suplementos. Siga una dieta sana y [place] un peso saludable. Una dieta saludable incluye productos lcteos descremados, carnes bajas en contenido de grasa ([place]), fibra de granos enteros, frijoles y [place] frutas y verduras. Mantngase al da con las vacunas. Realcese los estudios de rutina de la salud, dentales y de Wellsite geologist. Resumen Tener un estilo de vida saludable y recibir cuidados preventivos pueden ayudar a Research scientist (physical sciences) salud y el bienestar despus de los 65 aos de [place]. Realizarse pruebas de deteccin y anlisis es la mejor manera de Engineer, manufacturing un problema de salud de forma temprana y ayudarlo a Automotive engineer una cada. El diagnstico y tratamiento tempranos le brindan la mejor oportunidad de Chief Operating Officer las afecciones mdicas ms comunes en las personas mayores de 65 aos de edad. Las cadas son la causa principal de las fracturas de huesos y lesiones en la cabeza de personas mayores de 65 aos de edad. Tome precauciones para evitar una cada en su casa. Trabaje con el mdico para saber qu cambios que  puede hacer para mejorar su salud y [place], y para prevenir las cadas. Esta informacin no tiene Theme park manager el consejo del mdico. Asegrese de hacerle al mdico cualquier pregunta que tenga. Document Revised: 07/13/2020 Document Reviewed: 07/13/2020 Elsevier Patient Education  2024 Elsevier Inc.    Emil Schaumann, MD [facility]

## 2023-11-11 NOTE — Assessment & Plan Note (Signed)
 BP Readings from Last 3 Encounters:  11/11/23 124/84  11/08/23 110/70  11/04/23 (!) 144/74  Well-controlled hypertension Carvedilol  was recently reduced to 3.125 mg twice a day Continues valsartan  160 mg daily

## 2023-11-11 NOTE — Assessment & Plan Note (Signed)
 Secondary stroke prevention measures discussed Well-controlled hypertension Not diabetic On rosuvastatin  40 mg daily On aspirin  and Plavix 

## 2023-11-11 NOTE — Assessment & Plan Note (Signed)
Stable.  Diet and nutrition discussed. Continue rosuvastatin 40 mg daily. 

## 2023-11-11 NOTE — Patient Instructions (Signed)
 Mantenimiento de la salud despus de los 65 aos de edad Health Maintenance After Age 81 Despus de los 65 aos de edad, corre un riesgo mayor de Film/video editor enfermedades e infecciones a largo plazo, como tambin de sufrir lesiones por cadas. Las cadas son la causa principal de las fracturas de huesos y lesiones en la cabeza de personas mayores de 65 aos de edad. Recibir cuidados preventivos de forma regular puede ayudarlo a mantenerse saludable y en buen [place]. Los cuidados preventivos incluyen realizarse anlisis de forma regular y Forensic psychologist en el estilo de vida segn las recomendaciones del mdico. Converse con el mdico sobre lo siguiente: Las pruebas de deteccin y los anlisis que debe International aid/development worker. Una prueba de deteccin es un estudio que se para Engineer, manufacturing la presencia de una enfermedad cuando no tiene sntomas. Un plan de dieta y ejercicios adecuado para usted. Qu debo saber sobre las pruebas de deteccin y los anlisis para prevenir cadas? Realizarse pruebas de deteccin y [employer] es la mejor manera de Engineer, manufacturing un problema de salud de forma temprana. El diagnstico y tratamiento tempranos le brindan la mejor oportunidad de Chief Operating Officer las afecciones mdicas que son comunes despus de los 65 aos de edad. Ciertas afecciones y elecciones de estilo de vida pueden hacer que sea ms propenso a sufrir Engineer, site. El mdico puede recomendarle lo siguiente: Controles regulares de la visin. Una visin deficiente y afecciones como las cataratas pueden hacer que sea ms propenso a sufrir Engineer, site. Si usa  lentes, asegrese de obtener una receta actualizada si su visin cambia. Revisin de medicamentos. Revise regularmente con el mdico todos los medicamentos que toma, incluidos los medicamentos de [place]. Consulte al [employer] efectos secundarios que pueden hacer que sea ms propenso a sufrir Engineer, site. Informe al mdico si alguno de los medicamentos que toma lo hace sentir mareado o  somnoliento. Controles de fuerza y equilibrio. El mdico puede recomendar ciertos estudios para controlar su fuerza y equilibrio al estar de pie, al caminar o al cambiar de posicin. Examen de los pies. El dolor y Materials engineer en los pies, como tambin no utilizar el calzado adecuado, pueden hacer que sea ms propenso a sufrir Engineer, site. Pruebas de deteccin, que incluyen las siguientes: Pruebas de deteccin para la osteoporosis. La osteoporosis es una afeccin que hace que los huesos se tornen ms dbiles y se quiebren con ms facilidad. Pruebas de deteccin para la presin arterial. Los cambios en la presin arterial y los medicamentos para Chief Operating Officer la presin arterial pueden hacerlo sentir mareado. Prueba de deteccin de la depresin. Es ms probable que sufra una cada si tiene miedo a caerse, se siente deprimido o se siente incapaz de Probation officer. Prueba de deteccin de consumo de alcohol. Beber demasiado alcohol puede afectar su equilibrio y puede hacer que sea ms propenso a sufrir Engineer, site. Siga estas indicaciones en su casa: Estilo de vida No beba alcohol si: Su mdico le indica no hacerlo. Si bebe alcohol: Limite la cantidad que bebe a lo siguiente: De 0 a 1 medida por da para las mujeres. De 0 a 2 medidas por da para los hombres. Sepa cunta cantidad de alcohol hay en las bebidas que toma. En los [address], una medida equivale a una botella de cerveza de 12 oz (355 ml), un vaso de vino de 5 oz (148 ml) o un vaso de una bebida alcohlica de alta graduacin de 1 oz (44 ml). No consuma ningn producto que  contenga nicotina o tabaco. Estos productos incluyen cigarrillos, tabaco para mascar y aparatos de vapeo, como los cigarrillos electrnicos. Si necesita ayuda para dejar de consumir estos productos, consulte al [employer]. Actividad  Siga un programa de ejercicio regular para mantenerse en forma. Esto lo ayudar a Radio producer equilibrio. Consulte al  mdico qu tipos de ejercicios son adecuados para usted. Si necesita un bastn o un andador, selo segn las recomendaciones del mdico. Utilice calzado con buen apoyo y suela antideslizante. Seguridad  [employer] los [employer] puedan causar tropiezos tales como alfombras, cables u obstculos. Instale equipos de seguridad, como barras para sostn en los baos y barandas de seguridad en las escaleras. Mantenga las habitaciones y los pasillos bien iluminados. Indicaciones generales Hable con el mdico sobre sus riesgos de sufrir una cada. Infrmele a su mdico si: Se cae. Asegrese de informarle a su mdico acerca de todas las cadas, incluso aquellas que parecen ser [employer]. Se siente mareado, cansado (tiene fatiga) o siente que pierde el equilibrio. Use los medicamentos de venta libre y los recetados solamente como se lo haya indicado el mdico. Estos incluyen suplementos. Siga una dieta sana y [place] un peso saludable. Una dieta saludable incluye productos lcteos descremados, carnes bajas en contenido de grasa ([place]), fibra de granos enteros, frijoles y [place] frutas y verduras. Mantngase al da con las vacunas. Realcese los estudios de rutina de la salud, dentales y de Wellsite geologist. Resumen Tener un estilo de vida saludable y recibir cuidados preventivos pueden ayudar a Research scientist (physical sciences) salud y el bienestar despus de los 65 aos de [place]. Realizarse pruebas de deteccin y anlisis es la mejor manera de Engineer, manufacturing un problema de salud de forma temprana y ayudarlo a Automotive engineer una cada. El diagnstico y tratamiento tempranos le brindan la mejor oportunidad de Chief Operating Officer las afecciones mdicas ms comunes en las personas mayores de 65 aos de edad. Las cadas son la causa principal de las fracturas de huesos y lesiones en la cabeza de personas mayores de 65 aos de edad. Tome precauciones para evitar una cada en su casa. Trabaje con el mdico para saber qu cambios que puede hacer para mejorar su salud y  [place], y para prevenir las cadas. Esta informacin no tiene Theme park manager el consejo del mdico. Asegrese de hacerle al mdico cualquier pregunta que tenga. Document Revised: 07/13/2020 Document Reviewed: 07/13/2020 Elsevier Patient Education  2024 [employer].

## 2023-11-11 NOTE — Assessment & Plan Note (Signed)
 No evidence of volume overload.  Recent echo shows normal LVEF 60 to 65%.

## 2023-11-11 NOTE — Assessment & Plan Note (Signed)
 Well-controlled hypertension No signs or symptoms of congestive heart failure

## 2023-11-14 ENCOUNTER — Telehealth: Payer: Self-pay | Admitting: *Deleted

## 2023-11-14 NOTE — Progress Notes (Unsigned)
 Complex Care Management Note Care Guide Note  11/14/2023 Name: [name] MRN: [id number] DOB: 1940-12-11   Complex Care Management Outreach Attempts: An unsuccessful telephone outreach was attempted today to offer the patient information about available complex care management services.  Follow Up Plan:  Additional outreach attempts will be made to offer the patient complex care management information and services.   Encounter Outcome:  No Answer  Thedford Franks, CMA, Care Guide [facility]  [facility], [facility] Guide Direct Dial: [phone]  Fax: [phone] Website: [email or web address]

## 2023-11-15 NOTE — Progress Notes (Signed)
 Complex Care Management Note  Care Guide Note 11/15/2023 Name: [name] MRN: [id number] DOB: 1940/09/22  [name] is a 83 y.o. year old male who sees Sagardia, [name], MD for primary care. I reached out to Zannie Stallion by phone today to offer complex care management services.  [name] was given information about Complex Care Management services today including:   The Complex Care Management services include support from the care team which includes your Nurse Care Manager, Clinical Social Worker, or Pharmacist.  The Complex Care Management team is here to help remove barriers to the health concerns and goals most important to you. Complex Care Management services are voluntary, and the patient may decline or stop services at any time by request to their care team member.   Complex Care Management Consent Status: Patient agreed to services and verbal consent obtained.   Follow up plan:  Telephone appointment with complex care management team member scheduled for:  12/02/2023  Encounter Outcome:  Patient Scheduled  Thedford Franks, CMA [place]  [facility], [facility] Guide Direct Dial: [phone]  Fax: [phone] Website: [email or web address]

## 2023-12-02 ENCOUNTER — Telehealth: Payer: Self-pay

## 2023-12-03 ENCOUNTER — Encounter: Payer: Self-pay | Admitting: Neurology

## 2023-12-03 ENCOUNTER — Ambulatory Visit: Admitting: Neurology

## 2023-12-03 VITALS — BP 157/98 | HR 106 | Resp 15 | Ht 66.0 in | Wt 171.0 lb

## 2023-12-03 DIAGNOSIS — F32A Depression, unspecified: Secondary | ICD-10-CM | POA: Diagnosis not present

## 2023-12-03 DIAGNOSIS — R531 Weakness: Secondary | ICD-10-CM

## 2023-12-03 DIAGNOSIS — R351 Nocturia: Secondary | ICD-10-CM | POA: Diagnosis not present

## 2023-12-03 DIAGNOSIS — R269 Unspecified abnormalities of gait and mobility: Secondary | ICD-10-CM | POA: Diagnosis not present

## 2023-12-03 DIAGNOSIS — I6381 Other cerebral infarction due to occlusion or stenosis of small artery: Secondary | ICD-10-CM | POA: Diagnosis not present

## 2023-12-03 NOTE — Progress Notes (Unsigned)
 [facility]  PATIENT: [name] DOB: [birth year]  REQUESTING CLINICIAN: Ghimire, Donalda HERO, MD HISTORY FROM: Patient/Daughter/Spouse and Chart review  REASON FOR VISIT: CVA    HISTORICAL  CHIEF COMPLAINT:  Chief Complaint  Patient presents with   Hospitalization Follow-up    Rm13, wife and daughter present, HOSPITAL FU - Saw Dr. Jerri and needs a f/u w/ MD: gets tired easily. Gait issues(use cane).     HISTORY OF PRESENT ILLNESS:  Discussed the use of AI scribe software for clinical note transcription with the patient, who gave verbal consent to proceed.  [name] is an 83 year old male with a history hypertension, hyperlipidemia, who presents after a stroke on September 13 with generalized weakness and worsening crying spells.   He experienced a fall at church last month, during which he passed out and exhibited facial drooping. He was taken to the hospital and was told he had a small stroke. Stroke etiology likely small vessel disease. Since discharge, he has been experiencing generalized weakness and dizziness. His walking, which was slow even before the stroke, has worsened.  He is currently on Plavix  and Crestor  for stroke prevention and cholesterol management, respectively. He was previously on aspirin  before the stroke and was given both aspirin  and Plavix  for three weeks post-stroke. He is also on two medications for blood pressure management, though the specific medications and dosages were not mentioned.  He wakes up three to four times a night to urinate, which disrupts his sleep. No known urinary infections, but he has not had a recent check-up for this issue. He feels more tired than before the stroke, which his caregiver attributes to a decline in his health and increased difficulty with mobility.  His past medical history includes open-heart surgery and a stent placement a few years ago. He has also been experiencing symptoms of  depression for the past few months, which his caregiver believes may be related to his declining health and loss of independence, as he can no longer drive due to dizziness and vertigo.  Socially, he recently moved from [place] , and he has not visited his home country of [place] in over twenty years. He wants to visit again.   He was very emotional, stating that he is depressed, he no longer able to do things that he did in the past.   Brief Hospital Course and Summary: 83 year old with history of CAD s/p CABG, PE-completed a course of Eliquis  sometime in 2020 04/2022-who was at church when he sustained a what sounds like a syncopal episode without any prodrome.  He was brought to the ED-where further workup revealed acute CVA.   Acute right lateral predominant small ischemic infarct Likely secondary to small vessel disease CTA head/neck negative for LVO/significant stenosis-echo stable EF, LDL 122, A1c 5.2 Discussed with stroke MD-recommendations are for aspirin /Plavix  x 3 weeks then Plavix  alone   ?  Syncope Had a sudden onset of what sounds like loss of consciousness while at church without any prodrome.  Per patient-he has had several similar episodes of syncope in the past. Telemetry negative for arrhythmias Echo with stable EF CTA chest negative for acute PE-Dopplers negative for DVT Discussed with cardiology-Dr. Toney O'Neal-recommendations are to pursue outpatient cardiac monitoring, cardiology coordinator contacted via epic requesting 30-day cardiac monitoring followed by follow-up with primary cardiologist. Patient advised not to drive or operate heavy machinery. Note-EEG was negative.  Not felt to have seizures-no AEDs recommended by neurology.  OTHER MEDICAL CONDITIONS: CAD status post  CAD status post CABG, CAD status post CABG, History of PE, Hypertension, Hyperlipidemia    REVIEW OF SYSTEMS: Full 14 system review of systems performed and negative with exception of: As noted  in the HPI   ALLERGIES: Allergies  Allergen Reactions   Lipitor [Atorvastatin ] Shortness Of Breath and Other (See Comments)    Sores non head   Morphine  And Codeine Shortness Of Breath, Swelling and Rash    Throat swelling   Porcine (Pork) Protein-Containing Drug Products Swelling and Rash    Tongue swelling No pork products -     HOME MEDICATIONS: Outpatient Medications Prior to Visit  Medication Sig Dispense Refill   carvedilol  (COREG ) 3.125 MG tablet Take 1 tablet (3.125 mg total) by mouth 2 (two) times daily with a meal. 60 tablet 3   clopidogrel  (PLAVIX ) 75 MG tablet Take 1 tablet (75 mg total) by mouth daily. 30 tablet 3   rosuvastatin  (CRESTOR ) 40 MG tablet Take 1 tablet (40 mg total) by mouth daily. 30 tablet 2   valsartan  (DIOVAN ) 160 MG tablet Take 1 tablet (160 mg total) by mouth daily. 30 tablet 2   No facility-administered medications prior to visit.    PAST MEDICAL HISTORY: Past Medical History:  Diagnosis Date   Blurred vision 05/17/2016   CAD (coronary artery disease), native coronary artery 06/14/2008   a. BMS to LCx & OM 2008 b. 03/2017 CABG x 4 (LIMA to LAD, SVG to diag 1, SVG to distal Circ, SVG to PDA)   Chest pain 04/16/2012   Diplopia    Echocardiogram    Echo 10/19: mod LVH, EF 55-60, no RWMA, Gr 1 DD, trivial AI, MAC, mod LAE, prob small post effusion   Essential hypertension 05/17/2016   Hyperlipidemia with target low density lipoprotein (LDL) cholesterol less than 70 mg/dL 5/73/7989   Qualifier: Diagnosis of  By: [name], RMA, [name]     Hypertension    Hypertensive heart disease 06/14/2008   Qualifier: Diagnosis of  By: [name], RMA, [name]     Hypertensive urgency, malignant 04/16/2012   Hypokalemia 04/14/2017   Internal hemorrhoids 04/19/2012   Ischemic stroke (HCC) 05/02/2016   Non-ST elevation (NSTEMI) myocardial infarction [facility])    pandiverticulosis 04/22/2012   12/17/2011. [facility]. Renaye Sous MD. Colonoscopy. Moderate sized  internal hemorrhoids and extensive pandiverticulosis. Repeat 5 years.     Pericardial effusion    Echo 11/19: mild focal basal septal hypertrophy, EF 55-60, Gr 2 DD, trivial AI, trivial TR, trivial PI, small to mod eff post to heart - no evidence of RV collapse.>> repeat limited echo in 03/2018 // Echo 03/2018: EF 55-60, mild LVH, +diastolic dysfunction, no RWMA, PASP 23, trivial pericardial effusion    Polycythemia vera(238.4) 04/17/2012   S/P CABG x 4 04/17/2017   Syncope and collapse 04/17/2012   Pt syncopized while sitting in bed giving history @ time of admission to hospital Tachycardic (appeared sinus) to 120s-130s and hypotensive to 50s/30s.  Unresponsive initially >> Spontaneously resolved after 2-3 minutes >> return to baseline ~30 minutes    Vertigo     PAST SURGICAL HISTORY: Past Surgical History:  Procedure Laterality Date   CORONARY ARTERY BYPASS GRAFT N/A 04/17/2017   Procedure: CORONARY ARTERY BYPASS GRAFTING (CABG) x4 , using left internal mammary artery  to LAD and right leg greater saphenous vein harvested endoscopically  to PDA, Diagonal I and Circumflex;  Surgeon: [name], MD;  Location: [facility] OR;  Service: Open Heart Surgery;  Laterality: N/A;  CORONARY ARTERY BYPASS GRAFT  2020   CORONARY STENT PLACEMENT     LEFT HEART CATH AND CORONARY ANGIOGRAPHY N/A 04/16/2017   Procedure: LEFT HEART CATH AND CORONARY ANGIOGRAPHY;  Surgeon: [name], MD;  Location: [facility] INVASIVE CV LAB;  Service: Cardiovascular;  Laterality: N/A;   TEE WITHOUT CARDIOVERSION N/A 04/17/2017   Procedure: TRANSESOPHAGEAL ECHOCARDIOGRAM (TEE);  Surgeon: [name], MD;  Location: [facility] OR;  Service: Open Heart Surgery;  Laterality: N/A;    FAMILY HISTORY: Family History  Problem Relation Age of Onset   Heart disease Mother    Cancer Sister     SOCIAL HISTORY: Social History   Socioeconomic History   Marital status: Married    Spouse name: Not on file   Number of children: Not on file    Years of education: Not on file   Highest education level: Not on file  Occupational History   Not on file  Tobacco Use   Smoking status: Never   Smokeless tobacco: Never  Vaping Use   Vaping status: Never Used  Substance and Sexual Activity   Alcohol use: No    Alcohol/week: 0.0 standard drinks of alcohol   Drug use: No   Sexual activity: Not on file  Other Topics Concern   Not on file  Social History Narrative   Lives in [place] with Wife and 2 sons.  From [place].  To [place].     Currently retired but worked in Theatre manager   Social Drivers of Corporate investment banker Strain: High Risk (09/24/2022)   Overall Financial Resource Strain (CARDIA)    Difficulty of Paying Living Expenses: Very hard  Food Insecurity: No Food Insecurity (09/24/2022)   Hunger Vital Sign    Worried About Running Out of Food in the Last Year: Never true    Ran Out of Food in the Last Year: Never true  Transportation Needs: No Transportation Needs (09/24/2022)   PRAPARE - Administrator, Civil Service (Medical): No    Lack of Transportation (Non-Medical): No  Physical Activity: Inactive (09/24/2022)   Exercise Vital Sign    Days of Exercise per Week: 0 days    Minutes of Exercise per Session: 0 min  Stress: No Stress Concern Present (09/24/2022)   [employer] of Occupational Health - Occupational Stress Questionnaire    Feeling of Stress : Not at all  Social Connections: Socially Integrated (09/24/2022)   Social Connection and Isolation Panel    Frequency of Communication with Friends and Family: More than three times a week    Frequency of Social Gatherings with Friends and Family: Three times a week    Attends Religious Services: More than 4 times per year    Active Member of Clubs or Organizations: No    Attends Banker Meetings: More than 4 times per year    Marital Status: Married  Catering manager Violence: Not At Risk (09/24/2022)   Humiliation,  Afraid, Rape, and Kick questionnaire    Fear of Current or Ex-Partner: No    Emotionally Abused: No    Physically Abused: No    Sexually Abused: No     PHYSICAL EXAM  GENERAL EXAM/CONSTITUTIONAL: Vitals:  Vitals:   12/03/23 0934 12/03/23 0939  BP: (!) 150/85 (!) 157/98  Pulse:  (!) 106  Resp:  15  SpO2:  97%  Weight:  171 lb (77.6 kg)  Height:  5' 6 (1.676 m)   Body mass index is  27.6 kg/m. Wt Readings from Last 3 Encounters:  12/03/23 171 lb (77.6 kg)  11/11/23 170 lb (77.1 kg)  11/08/23 168 lb (76.2 kg)   Patient is in no distress; well developed, nourished and groomed; neck is supple, crying, very tearful on exam   MUSCULOSKELETAL: Gait, strength, tone, movements noted in Neurologic exam below  NEUROLOGIC: MENTAL STATUS:     09/18/2021   10:47 AM  MMSE - Mini Mental State Exam  Orientation to time 5  Orientation to Place 5  Registration 3  Attention/ Calculation 5  Recall 3  Language- name 2 objects 2  Language- repeat 1  Language- follow 3 step command 3  Language- read & follow direction 1  Write a sentence 1  Copy design 1  Total score 30   awake, alert, oriented to person, place and time recent and remote memory intact normal attention and concentration language fluent, comprehension intact, naming intact fund of knowledge appropriate  CRANIAL NERVE:  2nd, 3rd, 4th, 6th - Visual fields full to confrontation, extraocular muscles intact, no nystagmus 5th - facial sensation symmetric 7th - facial strength symmetric 8th - hearing intact 9th - palate elevates symmetrically, uvula midline 11th - shoulder shrug symmetric 12th - tongue protrusion midline  MOTOR:  normal bulk and tone, full strength in the BUE, BLE  SENSORY:  normal and symmetric to light touch  COORDINATION:  finger-nose-finger, fine finger movements normal  GAIT/STATION:  Slow, uses a cane    DIAGNOSTIC DATA (LABS, IMAGING, TESTING) - I reviewed patient records, labs,  notes, testing and imaging myself where available.  Lab Results  Component Value Date   WBC 9.0 11/02/2023   HGB 13.6 11/02/2023   HCT 40.0 11/02/2023   MCV 88.0 11/02/2023   PLT 234 11/02/2023      Component Value Date/Time   NA 139 11/02/2023 1248   NA 143 02/27/2022 0954   K 3.7 11/02/2023 1248   CL 102 11/02/2023 1248   CO2 24 11/02/2023 1243   GLUCOSE 130 (H) 11/02/2023 1248   BUN 9 11/02/2023 1248   BUN 15 02/27/2022 0954   CREATININE 1.10 11/02/2023 1248   CREATININE 0.95 01/20/2016 1047   CALCIUM  8.9 11/02/2023 1243   PROT 6.9 11/02/2023 1243   PROT 7.0 02/27/2022 0954   ALBUMIN  3.2 (L) 11/02/2023 1243   ALBUMIN  4.1 02/27/2022 0954   AST 13 (L) 11/02/2023 1243   ALT 10 11/02/2023 1243   ALKPHOS 92 11/02/2023 1243   BILITOT 0.8 11/02/2023 1243   BILITOT 0.6 02/27/2022 0954   GFRNONAA >60 11/02/2023 1243   GFRAA 85 08/14/2019 0956   Lab Results  Component Value Date   CHOL 174 11/03/2023   HDL 36 (L) 11/03/2023   LDLCALC 122 (H) 11/03/2023   LDLDIRECT 150.8 06/30/2008   TRIG 80 11/03/2023   CHOLHDL 4.8 11/03/2023   Lab Results  Component Value Date   HGBA1C 5.2 11/03/2023   Lab Results  Component Value Date   VITAMINB12 497 05/31/2020   Lab Results  Component Value Date   TSH 2.110 02/27/2022    MRI Brain 11/02/2023 1. Acute 10 mm nonhemorrhagic infarct in the lateral right putamen nucleus with associated T2 and FLAIR signal. 2. Advanced confluent periventricular and subcortical white matter T2 signal changes for age. 3. Multiple punctate foci of susceptibility within the thalami, cerebellum, brainstem, and scattered in the subcortical white matter over the convexities. This is consistent with microhemorrhages and suggests underlying vasculitis 4. Remote lacunar infarcts in the  thalami bilaterally, right paramedian pons, and left cerebellum.   CTA Head and Neck 11/02/2023 1. Negative for large vessel occlusion. 2. Positive for abundant  atherosclerosis in the head and neck. Notable and/or increased since 2018 CTA stenoses: - Moderate LICA siphon stenosis appears progressed since 2018. - Moderate Right siphon stenosis appears stable. - Left ACA A2 segment Moderate stenosis is increased. - Moderate Left PCA P2 stenosis appears increased 3. No significant extracranial stenosis. 4. Aortic Atherosclerosis (ICD10-I70.0).    ASSESSMENT AND PLAN  83 y.o. year old male with   Right hemispheric small vessel cerebral infarction Recent right hemispheric small vessel cerebral infarction with symptoms of facial drooping and non-responsiveness. Currently on antiplatelet therapy with Plavix  and cholesterol management with Crestor . No significant deficits post-stroke, but increased tiredness and weakness are present. - Continue Plavix  for antiplatelet therapy. - Continue Crestor  for cholesterol management. - Arrange outpatient physical therapy to improve strength and aid recovery.  Generalized weakness Reports generalized weakness with increased tiredness post-stroke. No focal weakness noted. Generalized weakness may be exacerbated by nocturia affecting sleep quality. Depression is suspected to contribute to weakness and overall decline in health. - Arrange outpatient physical therapy to improve strength. - Discuss depressive symptoms with primary care provider for further evaluation and management.  Essential hypertension Currently on two medications for blood pressure management. Recent episode of dizziness and low blood pressure led to a change in medication. Current blood pressure is 150/85. Dizziness and vertigo possibly related to medication or blood pressure fluctuations. - Monitor blood pressure regularly. - Review current antihypertensive medications with primary care provider to ensure optimal management.  Nocturia Experiences nocturia, waking up 3-4 times per night, possibly contributing to generalized weakness due to poor  sleep quality. No evidence of urinary tract infection, but further evaluation is recommended. - Consult primary care provider to evaluate for possible urinary tract infection and other causes of nocturia. - Consider medication adjustments to manage nocturia if necessary.    1. Cerebrovascular accident (CVA) due to occlusion of small artery (HCC)   2. Depression, unspecified depression type   3. Gait abnormality   4. Generalized weakness   5. Nocturia      Patient Instructions  Continue current medication including Plavix  Referral to physical therapy for gait training and generalized weakness Please follow-up with PCP to discuss depressive symptoms Please follow-up PCP to discuss nocturia symptoms Return if worse  Orders Placed This Encounter  Procedures   Ambulatory referral to Physical Therapy    No orders of the defined types were placed in this encounter.   Return if symptoms worsen or fail to improve.  I have spent a total of 65 minutes dedicated to this patient today, preparing to see patient, performing a medically appropriate examination and evaluation, ordering tests and/or medications and procedures, and counseling and educating the patient/family/caregiver; independently interpreting result and communicating results to the family/patient/caregiver; and documenting clinical information in the electronic medical record.   Pastor Falling, MD 12/04/2023, 1:42 PM  [facility] [address] [place], [place] [address] [phone]

## 2023-12-04 NOTE — Patient Instructions (Signed)
 Continue current medication including Plavix  Referral to physical therapy for gait training and generalized weakness Please follow-up with PCP to discuss depressive symptoms Please follow-up PCP to discuss nocturia symptoms Return if worse

## 2023-12-12 ENCOUNTER — Other Ambulatory Visit: Payer: Self-pay

## 2023-12-12 NOTE — Patient Outreach (Unsigned)
 Complex Care Management   Visit Note  12/12/2023  Name:  [name] MRN: [id number] DOB: 10-Dec-1940  Situation: Referral received for Complex Care Management related to recent stroke, HTN I obtained verbal consent from Patient.  Visit completed with Patient  on the phone  Background:   Past Medical History:  Diagnosis Date   Blurred vision 05/17/2016   CAD (coronary artery disease), native coronary artery 06/14/2008   a. BMS to LCx & OM 2008 b. 03/2017 CABG x 4 (LIMA to LAD, SVG to diag 1, SVG to distal Circ, SVG to PDA)   Chest pain 04/16/2012   Diplopia    Echocardiogram    Echo 10/19: mod LVH, EF 55-60, no RWMA, Gr 1 DD, trivial AI, MAC, mod LAE, prob small post effusion   Essential hypertension 05/17/2016   Hyperlipidemia with target low density lipoprotein (LDL) cholesterol less than 70 mg/dL 5/73/7989   Qualifier: Diagnosis of  By: [name], RMA, [name]     Hypertension    Hypertensive heart disease 06/14/2008   Qualifier: Diagnosis of  By: [name], RMA, [name]     Hypertensive urgency, malignant 04/16/2012   Hypokalemia 04/14/2017   Internal hemorrhoids 04/19/2012   Ischemic stroke (HCC) 05/02/2016   Non-ST elevation (NSTEMI) myocardial infarction [facility])    pandiverticulosis 04/22/2012   12/17/2011. [facility]. Renaye Sous MD. Colonoscopy. Moderate sized internal hemorrhoids and extensive pandiverticulosis. Repeat 5 years.     Pericardial effusion    Echo 11/19: mild focal basal septal hypertrophy, EF 55-60, Gr 2 DD, trivial AI, trivial TR, trivial PI, small to mod eff post to heart - no evidence of RV collapse.>> repeat limited echo in 03/2018 // Echo 03/2018: EF 55-60, mild LVH, +diastolic dysfunction, no RWMA, PASP 23, trivial pericardial effusion    Polycythemia vera(238.4) 04/17/2012   S/P CABG x 4 04/17/2017   Syncope and collapse 04/17/2012   Pt syncopized while sitting in bed giving history @ time of admission to hospital Tachycardic (appeared sinus) to 120s-130s  and hypotensive to 50s/30s.  Unresponsive initially >> Spontaneously resolved after 2-3 minutes >> return to baseline ~30 minutes    Vertigo     Assessment: Patient Reported Symptoms:  Cognitive Cognitive Status:  (per daughter patient is oriented to person place)      Neurological Neurological Review of Symptoms: Weakness Neurological Management Strategies: Medical device, Medication therapy, Routine screening  HEENT HEENT Symptoms Reported: No symptoms reported      Cardiovascular Cardiovascular Symptoms Reported: No symptoms reported    Respiratory Respiratory Symptoms Reported: No symptoms reported    Endocrine Endocrine Symptoms Reported: No symptoms reported Is patient diabetic?: No    Gastrointestinal Gastrointestinal Symptoms Reported: No symptoms reported      Genitourinary Genitourinary Symptoms Reported: No symptoms reported    Integumentary Integumentary Symptoms Reported: No symptoms reported    Musculoskeletal Musculoskelatal Symptoms Reviewed: Unsteady gait, Weakness Additional Musculoskeletal Details: gait unsteady, per daughter also prior to stroke Musculoskeletal Management Strategies: Routine screening, Medical device, Adequate rest, Activity Musculoskeletal Self-Management Outcome: 4 (good) Falls in the past year?: Yes Number of falls in past year: 2 or more Was there an injury with Fall?: No Fall Risk Category Calculator: 2 Patient Fall Risk Level: Moderate Fall Risk    Psychosocial Additional Psychological Details: per daughter feels that patient may have some depression- not driving, relies more on his children. declines lcsw referral     Quality of Family Relationships: supportive, involved, helpful Do you feel physically threatened by others?:  (spoke  with daughter)    There were no vitals filed for this visit.  Medications Reviewed Today     Reviewed by Markel Kurtenbach M, RN (Registered Nurse) on 12/12/23 at [phone]  Med List Status: <None>    Medication Order Taking? Sig Documenting Provider Last Dose Status Informant  carvedilol  (COREG ) 3.125 MG tablet [id number] Yes Take 1 tablet (3.125 mg total) by mouth 2 (two) times daily with a meal. [name], MD  Active   clopidogrel  (PLAVIX ) 75 MG tablet [id number] Yes Take 1 tablet (75 mg total) by mouth daily. Raenelle Donalda HERO, MD  Active   rosuvastatin  (CRESTOR ) 40 MG tablet [id number] Yes Take 1 tablet (40 mg total) by mouth daily. Raenelle Donalda HERO, MD  Active   valsartan  (DIOVAN ) 160 MG tablet [id number] Yes Take 1 tablet (160 mg total) by mouth daily. Raenelle Donalda HERO, MD  Active           Recommendation:   PCP Follow-up  Follow Up Plan:   Telephone follow up appointment date/time:  12/25/23 at 11:00 am  Heddy Shutter, RN, MSN, BSN, CCM [place]  [facility], Population Health Case Manager Phone: [phone]

## 2023-12-23 ENCOUNTER — Encounter: Payer: Self-pay | Admitting: Emergency Medicine

## 2023-12-23 ENCOUNTER — Ambulatory Visit (INDEPENDENT_AMBULATORY_CARE_PROVIDER_SITE_OTHER)

## 2023-12-23 ENCOUNTER — Ambulatory Visit (INDEPENDENT_AMBULATORY_CARE_PROVIDER_SITE_OTHER): Admitting: Emergency Medicine

## 2023-12-23 VITALS — BP 120/70 | HR 62 | Temp 97.6°F | Ht 64.0 in | Wt 170.0 lb

## 2023-12-23 VITALS — BP 140/80 | HR 62 | Ht 64.0 in | Wt 170.0 lb

## 2023-12-23 DIAGNOSIS — Z130 Encounter for screening for diseases of the blood and blood-forming organs and certain disorders involving the immune mechanism: Secondary | ICD-10-CM

## 2023-12-23 DIAGNOSIS — Z0000 Encounter for general adult medical examination without abnormal findings: Secondary | ICD-10-CM | POA: Diagnosis not present

## 2023-12-23 DIAGNOSIS — R54 Age-related physical debility: Secondary | ICD-10-CM | POA: Diagnosis not present

## 2023-12-23 DIAGNOSIS — R2681 Unsteadiness on feet: Secondary | ICD-10-CM

## 2023-12-23 DIAGNOSIS — I5032 Chronic diastolic (congestive) heart failure: Secondary | ICD-10-CM | POA: Diagnosis not present

## 2023-12-23 DIAGNOSIS — Z0001 Encounter for general adult medical examination with abnormal findings: Secondary | ICD-10-CM | POA: Diagnosis not present

## 2023-12-23 DIAGNOSIS — I700 Atherosclerosis of aorta: Secondary | ICD-10-CM

## 2023-12-23 DIAGNOSIS — I110 Hypertensive heart disease with heart failure: Secondary | ICD-10-CM | POA: Diagnosis not present

## 2023-12-23 DIAGNOSIS — Z23 Encounter for immunization: Secondary | ICD-10-CM | POA: Diagnosis not present

## 2023-12-23 DIAGNOSIS — I10 Essential (primary) hypertension: Secondary | ICD-10-CM | POA: Diagnosis not present

## 2023-12-23 DIAGNOSIS — Z8673 Personal history of transient ischemic attack (TIA), and cerebral infarction without residual deficits: Secondary | ICD-10-CM

## 2023-12-23 DIAGNOSIS — E785 Hyperlipidemia, unspecified: Secondary | ICD-10-CM | POA: Diagnosis not present

## 2023-12-23 DIAGNOSIS — I2510 Atherosclerotic heart disease of native coronary artery without angina pectoris: Secondary | ICD-10-CM | POA: Diagnosis not present

## 2023-12-23 DIAGNOSIS — I2584 Coronary atherosclerosis due to calcified coronary lesion: Secondary | ICD-10-CM

## 2023-12-23 LAB — CBC WITH DIFFERENTIAL/PLATELET
Basophils Absolute: 0.1 K/uL (ref 0.0–0.1)
Basophils Relative: 0.8 % (ref 0.0–3.0)
Eosinophils Absolute: 0.4 K/uL (ref 0.0–0.7)
Eosinophils Relative: 4.8 % (ref 0.0–5.0)
HCT: 42.1 % (ref 39.0–52.0)
Hemoglobin: 14.3 g/dL (ref 13.0–17.0)
Lymphocytes Relative: 24.6 % (ref 12.0–46.0)
Lymphs Abs: 2 K/uL (ref 0.7–4.0)
MCHC: 33.9 g/dL (ref 30.0–36.0)
MCV: 87.2 fl (ref 78.0–100.0)
Monocytes Absolute: 0.5 K/uL (ref 0.1–1.0)
Monocytes Relative: 6.9 % (ref 3.0–12.0)
Neutro Abs: 5 K/uL (ref 1.4–7.7)
Neutrophils Relative %: 62.9 % (ref 43.0–77.0)
Platelets: 185 K/uL (ref 150.0–400.0)
RBC: 4.83 Mil/uL (ref 4.22–5.81)
RDW: 15.4 % (ref 11.5–15.5)
WBC: 7.9 K/uL (ref 4.0–10.5)

## 2023-12-23 LAB — VITAMIN B12: Vitamin B-12: 489 pg/mL (ref 211–911)

## 2023-12-23 LAB — COMPREHENSIVE METABOLIC PANEL WITH GFR
ALT: 10 U/L (ref 0–53)
AST: 13 U/L (ref 0–37)
Albumin: 3.9 g/dL (ref 3.5–5.2)
Alkaline Phosphatase: 83 U/L (ref 39–117)
BUN: 12 mg/dL (ref 6–23)
CO2: 32 meq/L (ref 19–32)
Calcium: 9.4 mg/dL (ref 8.4–10.5)
Chloride: 103 meq/L (ref 96–112)
Creatinine, Ser: 0.83 mg/dL (ref 0.40–1.50)
GFR: 82.01 mL/min (ref >60.00)
Glucose, Bld: 93 mg/dL (ref 70–99)
Potassium: 3.4 meq/L — ABNORMAL LOW (ref 3.5–5.1)
Sodium: 141 meq/L (ref 135–145)
Total Bilirubin: 0.7 mg/dL (ref 0.2–1.2)
Total Protein: 7.6 g/dL (ref 6.0–8.3)

## 2023-12-23 LAB — URINALYSIS
Hgb urine dipstick: NEGATIVE
Leukocytes,Ua: NEGATIVE
Nitrite: NEGATIVE
Specific Gravity, Urine: 1.02 (ref 1.000–1.030)
Urine Glucose: NEGATIVE
Urobilinogen, UA: 1 (ref 0.0–1.0)
pH: 6 (ref 5.0–8.0)

## 2023-12-23 LAB — TSH: TSH: 3.56 u[IU]/mL (ref 0.35–5.50)

## 2023-12-23 LAB — LIPID PANEL
Cholesterol: 102 mg/dL (ref 0–200)
HDL: 40.1 mg/dL (ref >39.00)
LDL Cholesterol: 46 mg/dL (ref 0–99)
NonHDL: 61.83
Total CHOL/HDL Ratio: 3
Triglycerides: 81 mg/dL (ref 0.0–149.0)
VLDL: 16.2 mg/dL (ref 0.0–40.0)

## 2023-12-23 LAB — VITAMIN D 25 HYDROXY (VIT D DEFICIENCY, FRACTURES): VITD: 29.69 ng/mL — ABNORMAL LOW (ref 30.00–100.00)

## 2023-12-23 NOTE — Assessment & Plan Note (Signed)
 No evidence of volume overload.  Recent echo shows normal LVEF 60 to 65%.

## 2023-12-23 NOTE — Assessment & Plan Note (Signed)
 Secondary stroke prevention measures discussed Well-controlled hypertension Not diabetic On rosuvastatin  40 mg daily On aspirin  and Plavix 

## 2023-12-23 NOTE — Progress Notes (Signed)
 [name] 83 y.o.   Chief Complaint  Patient presents with   Annual Exam    HISTORY OF PRESENT ILLNESS: This is a 83 y.o. male here for annual exam and follow-up on multiple chronic medical problems. Accompanied by wife and daughter. Concerned about leg weakness and physical deconditioning. No other complaints or medical concerns today.  HPI   Prior to Admission medications   Medication Sig Start Date End Date Taking? Authorizing Provider  carvedilol  (COREG ) 3.125 MG tablet Take 1 tablet (3.125 mg total) by mouth 2 (two) times daily with a meal. 11/08/23  Yes [name], MD  clopidogrel  (PLAVIX ) 75 MG tablet Take 1 tablet (75 mg total) by mouth daily. 11/04/23  Yes Ghimire, Donalda HERO, MD  rosuvastatin  (CRESTOR ) 40 MG tablet Take 1 tablet (40 mg total) by mouth daily. 11/04/23  Yes Ghimire, Donalda HERO, MD  valsartan  (DIOVAN ) 160 MG tablet Take 1 tablet (160 mg total) by mouth daily. 11/05/23  Yes Ghimire, Donalda HERO, MD    Allergies  Allergen Reactions   Lipitor Feronia.forget ] Shortness Of Breath and Other (See Comments)    Sores non head   Morphine  And Codeine Shortness Of Breath, Swelling and Rash    Throat swelling   Porcine (Pork) Protein-Containing Drug Products Swelling and Rash    Tongue swelling No pork products -     Patient Active Problem List   Diagnosis Date Noted   History of recent fall 07/30/2022   History of stroke 03/07/2021   Gait instability 09/20/2020   Aortic atherosclerosis 05/31/2020   Chronic diastolic heart failure (HCC) 02/14/2018   Essential hypertension 05/17/2016   pandiverticulosis 04/22/2012   Internal hemorrhoids 04/19/2012   Dyslipidemia 06/14/2008   Hypertensive heart disease 06/14/2008   CAD (coronary artery disease) 06/14/2008    Past Medical History:  Diagnosis Date   Blurred vision 05/17/2016   CAD (coronary artery disease), native coronary artery 06/14/2008   a. BMS to LCx & OM 2008 b. 03/2017 CABG x 4 (LIMA to LAD,  SVG to diag 1, SVG to distal Circ, SVG to PDA)   Chest pain 04/16/2012   Diplopia    Echocardiogram    Echo 10/19: mod LVH, EF 55-60, no RWMA, Gr 1 DD, trivial AI, MAC, mod LAE, prob small post effusion   Essential hypertension 05/17/2016   Hyperlipidemia with target low density lipoprotein (LDL) cholesterol less than 70 mg/dL 5/73/7989   Qualifier: Diagnosis of  By: [name], RMA, [name]     Hypertension    Hypertensive heart disease 06/14/2008   Qualifier: Diagnosis of  By: [name], RMA, [name]     Hypertensive urgency, malignant 04/16/2012   Hypokalemia 04/14/2017   Internal hemorrhoids 04/19/2012   Ischemic stroke (HCC) 05/02/2016   Non-ST elevation (NSTEMI) myocardial infarction [facility])    pandiverticulosis 04/22/2012   12/17/2011. [facility]. Renaye Sous MD. Colonoscopy. Moderate sized internal hemorrhoids and extensive pandiverticulosis. Repeat 5 years.     Pericardial effusion    Echo 11/19: mild focal basal septal hypertrophy, EF 55-60, Gr 2 DD, trivial AI, trivial TR, trivial PI, small to mod eff post to heart - no evidence of RV collapse.>> repeat limited echo in 03/2018 // Echo 03/2018: EF 55-60, mild LVH, +diastolic dysfunction, no RWMA, PASP 23, trivial pericardial effusion    Polycythemia vera(238.4) 04/17/2012   S/P CABG x 4 04/17/2017   Syncope and collapse 04/17/2012   Pt syncopized while sitting in bed giving history @ time of admission to hospital Tachycardic (appeared sinus)  to 120s-130s and hypotensive to 50s/30s.  Unresponsive initially >> Spontaneously resolved after 2-3 minutes >> return to baseline ~30 minutes    Vertigo     Past Surgical History:  Procedure Laterality Date   CORONARY ARTERY BYPASS GRAFT N/A 04/17/2017   Procedure: CORONARY ARTERY BYPASS GRAFTING (CABG) x4 , using left internal mammary artery  to LAD and right leg greater saphenous vein harvested endoscopically  to PDA, Diagonal I and Circumflex;  Surgeon: [name], MD;  Location: [facility] OR;   Service: Open Heart Surgery;  Laterality: N/A;   CORONARY ARTERY BYPASS GRAFT  2020   CORONARY STENT PLACEMENT     LEFT HEART CATH AND CORONARY ANGIOGRAPHY N/A 04/16/2017   Procedure: LEFT HEART CATH AND CORONARY ANGIOGRAPHY;  Surgeon: [name], MD;  Location: [facility] INVASIVE CV LAB;  Service: Cardiovascular;  Laterality: N/A;   TEE WITHOUT CARDIOVERSION N/A 04/17/2017   Procedure: TRANSESOPHAGEAL ECHOCARDIOGRAM (TEE);  Surgeon: [name], MD;  Location: [facility] OR;  Service: Open Heart Surgery;  Laterality: N/A;    Social History   Socioeconomic History   Marital status: Married    Spouse name: Not on file   Number of children: Not on file   Years of education: Not on file   Highest education level: Not on file  Occupational History   Not on file  Tobacco Use   Smoking status: Never   Smokeless tobacco: Never  Vaping Use   Vaping status: Never Used  Substance and Sexual Activity   Alcohol use: No    Alcohol/week: 0.0 standard drinks of alcohol   Drug use: No   Sexual activity: Not Currently  Other Topics Concern   Not on file  Social History Narrative   Lives in [place] with Wife and 2 sons.  From [place].  To [place].     Currently retired but worked in theatre manager   Social Drivers of Corporate Investment Banker Strain: High Risk (09/24/2022)   Overall Financial Resource Strain (CARDIA)    Difficulty of Paying Living Expenses: Very hard  Food Insecurity: No Food Insecurity (12/23/2023)   Hunger Vital Sign    Worried About Running Out of Food in the Last Year: Never true    Ran Out of Food in the Last Year: Never true  Transportation Needs: No Transportation Needs (12/23/2023)   PRAPARE - Administrator, Civil Service (Medical): No    Lack of Transportation (Non-Medical): No  Physical Activity: Insufficiently Active (12/23/2023)   Exercise Vital Sign    Days of Exercise per Week: 3 days    Minutes of Exercise per Session: 20 min  Stress:  No Stress Concern Present (12/23/2023)   [employer] of Occupational Health - Occupational Stress Questionnaire    Feeling of Stress: Not at all  Social Connections: Socially Integrated (12/23/2023)   Social Connection and Isolation Panel    Frequency of Communication with Friends and Family: More than three times a week    Frequency of Social Gatherings with Friends and Family: Three times a week    Attends Religious Services: More than 4 times per year    Active Member of Clubs or Organizations: No    Attends Banker Meetings: More than 4 times per year    Marital Status: Married  Catering Manager Violence: Not At Risk (12/23/2023)   Humiliation, Afraid, Rape, and Kick questionnaire    Fear of Current or Ex-Partner: No    Emotionally Abused:  No    Physically Abused: No    Sexually Abused: No    Family History  Problem Relation Age of Onset   Heart disease Mother    Cancer Sister      Review of Systems  Constitutional: Negative.  Negative for chills and fever.  HENT: Negative.  Negative for congestion and sore throat.   Respiratory: Negative.  Negative for cough and shortness of breath.   Cardiovascular: Negative.  Negative for chest pain and palpitations.  Gastrointestinal:  Negative for abdominal pain, diarrhea, nausea and vomiting.  Genitourinary: Negative.  Negative for dysuria and hematuria.  Skin: Negative.  Negative for rash.  Neurological: Negative.  Negative for dizziness and headaches.  All other systems reviewed and are negative.   Vitals:   12/23/23 1356 12/23/23 1400  BP: (!) 140/80 (!) 140/80  Pulse: 62   Temp: 97.6 F (36.4 C)   SpO2: 97%     Physical Exam Vitals reviewed.  Constitutional:      Appearance: Normal appearance.  HENT:     Head: Normocephalic.     Mouth/Throat:     Mouth: Mucous membranes are moist.     Pharynx: Oropharynx is clear.  Eyes:     Extraocular Movements: Extraocular movements intact.      Conjunctiva/sclera: Conjunctivae normal.     Pupils: Pupils are equal, round, and reactive to light.  Cardiovascular:     Rate and Rhythm: Normal rate and regular rhythm.     Pulses: Normal pulses.     Heart sounds: Normal heart sounds.  Pulmonary:     Effort: Pulmonary effort is normal.     Breath sounds: Normal breath sounds.  Abdominal:     Palpations: Abdomen is soft.     Tenderness: There is no abdominal tenderness.  Musculoskeletal:     Cervical back: No tenderness.     Right lower leg: No edema.     Left lower leg: No edema.  Lymphadenopathy:     Cervical: No cervical adenopathy.  Skin:    General: Skin is warm and dry.  Neurological:     General: No focal deficit present.     Mental Status: He is alert and oriented to person, place, and time.  Psychiatric:        Mood and Affect: Mood normal.        Behavior: Behavior normal.      ASSESSMENT & PLAN: Problem List Items Addressed This Visit       Cardiovascular and Mediastinum   Hypertensive heart disease (Chronic)   Well-controlled hypertension No signs or symptoms of congestive heart failure      CAD (coronary artery disease)   No anginal symptoms. On appropriate medical therapy with aspirin , high intensity statin drug, ARB, and beta-blocker.       Essential hypertension   BP Readings from Last 3 Encounters:  12/23/23 120/70  12/23/23 (!) 140/80  12/12/23 (!) 142/83  Well-controlled hypertension Carvedilol  was recently reduced to 3.125 mg twice a day Continues valsartan  160 mg daily        Relevant Orders   Urinalysis   CBC with Differential/Platelet   Comprehensive metabolic panel with GFR   Lipid panel   VITAMIN D 25 Hydroxy (Vit-D Deficiency, Fractures)   Vitamin B12   TSH   Chronic diastolic heart failure (HCC)   No evidence of volume overload. Recent echo shows normal LVEF 60 to 65%.       Aortic atherosclerosis   Stable.  Diet and  nutrition discussed. Continue rosuvastatin  40 mg  daily.        Other   Dyslipidemia   Chronic stable condition. Continue rosuvastatin  40 mg daily      Relevant Orders   Urinalysis   CBC with Differential/Platelet   Comprehensive metabolic panel with GFR   Lipid panel   VITAMIN D 25 Hydroxy (Vit-D Deficiency, Fractures)   Vitamin B12   TSH   Gait instability   Leading to multiple falls Recommend physical therapy Recommend use of a walker      History of stroke   Secondary stroke prevention measures discussed Well-controlled hypertension Not diabetic On rosuvastatin  40 mg daily On aspirin  and Plavix       Other Visit Diagnoses       Encounter for general adult medical examination with abnormal findings    -  Primary   Relevant Orders   Urinalysis   CBC with Differential/Platelet   Comprehensive metabolic panel with GFR   Lipid panel   VITAMIN D 25 Hydroxy (Vit-D Deficiency, Fractures)   Vitamin B12   TSH     Frailty syndrome in geriatric patient       Relevant Orders   Urinalysis   CBC with Differential/Platelet   Comprehensive metabolic panel with GFR   Lipid panel   VITAMIN D 25 Hydroxy (Vit-D Deficiency, Fractures)   Vitamin B12   TSH   Ambulatory referral to Geriatrics     Screening for deficiency anemia       Relevant Orders   CBC with Differential/Platelet     Screening for endocrine, metabolic and immunity disorder       Relevant Orders   Comprehensive metabolic panel with GFR   VITAMIN D 25 Hydroxy (Vit-D Deficiency, Fractures)   Vitamin B12   TSH      Modifiable risk factors discussed with patient. Anticipatory guidance according to age provided. The following topics were also discussed: Social Determinants of Health Smoking.  Non-smoker Diet and nutrition Benefits of exercise Cancer screening and review of most recent colonoscopy report from 2018 Vaccinations review and recommendations Cardiovascular risk assessment Review of multiple chronic medical conditions under  management Review of all medications Mental health including depression and anxiety Fall and accident prevention  Patient Instructions  Health Maintenance After Age 14 After age 73, you are at a higher risk for certain long-term diseases and infections as well as injuries from falls. Falls are a major cause of broken bones and head injuries in people who are older than age 17. Getting regular preventive care can help to keep you healthy and well. Preventive care includes getting regular testing and making lifestyle changes as recommended by your health care provider. Talk with your health care provider about: Which screenings and tests you should have. A screening is a test that checks for a disease when you have no symptoms. A diet and exercise plan that is right for you. What should I know about screenings and tests to prevent falls? Screening and testing are the best ways to find a health problem early. Early diagnosis and treatment give you the best chance of managing medical conditions that are common after age 59. Certain conditions and lifestyle choices may make you more likely to have a fall. Your health care provider may recommend: Regular vision checks. Poor vision and conditions such as cataracts can make you more likely to have a fall. If you wear glasses, make sure to get your prescription updated if your vision changes. Medicine review. Work  with your health care provider to regularly review all of the medicines you are taking, including over-the-counter medicines. Ask your health care provider about any side effects that may make you more likely to have a fall. Tell your health care provider if any medicines that you take make you feel dizzy or sleepy. Strength and balance checks. Your health care provider may recommend certain tests to check your strength and balance while standing, walking, or changing positions. Foot health exam. Foot pain and numbness, as well as not wearing proper  footwear, can make you more likely to have a fall. Screenings, including: Osteoporosis screening. Osteoporosis is a condition that causes the bones to get weaker and break more easily. Blood pressure screening. Blood pressure changes and medicines to control blood pressure can make you feel dizzy. Depression screening. You may be more likely to have a fall if you have a fear of falling, feel depressed, or feel unable to do activities that you used to do. Alcohol use screening. Using too much alcohol can affect your balance and may make you more likely to have a fall. Follow these instructions at home: Lifestyle Do not drink alcohol if: Your health care provider tells you not to drink. If you drink alcohol: Limit how much you have to: 0-1 drink a day for women. 0-2 drinks a day for men. Know how much alcohol is in your drink. In the [place] one drink equals one 12 oz bottle of beer (355 mL), one 5 oz glass of wine (148 mL), or one 1 oz glass of hard liquor (44 mL). Do not use any products that contain nicotine or tobacco. These products include cigarettes, chewing tobacco, and vaping devices, such as e-cigarettes. If you need help quitting, ask your health care provider. Activity  Follow a regular exercise program to stay fit. This will help you maintain your balance. Ask your health care provider what types of exercise are appropriate for you. If you need a cane or walker, use it as recommended by your health care provider. Wear supportive shoes that have nonskid soles. Safety  Remove any tripping hazards, such as rugs, cords, and clutter. Install safety equipment such as grab bars in bathrooms and safety rails on stairs. Keep rooms and walkways well-lit. General instructions Talk with your health care provider about your risks for falling. Tell your health care provider if: You fall. Be sure to tell your health care provider about all falls, even ones that seem minor. You feel dizzy,  tiredness (fatigue), or off-balance. Take over-the-counter and prescription medicines only as told by your health care provider. These include supplements. Eat a healthy diet and maintain a healthy weight. A healthy diet includes low-fat dairy products, low-fat (lean) meats, and fiber from whole grains, beans, and lots of fruits and vegetables. Stay current with your vaccines. Schedule regular health, dental, and eye exams. Summary Having a healthy lifestyle and getting preventive care can help to protect your health and wellness after age 46. Screening and testing are the best way to find a health problem early and help you avoid having a fall. Early diagnosis and treatment give you the best chance for managing medical conditions that are more common for people who are older than age 54. Falls are a major cause of broken bones and head injuries in people who are older than age 66. Take precautions to prevent a fall at home. Work with your health care provider to learn what changes you can make to improve your  health and wellness and to prevent falls. This information is not intended to replace advice given to you by your health care provider. Make sure you discuss any questions you have with your health care provider. Document Revised: 06/27/2020 Document Reviewed: 06/27/2020 Elsevier Patient Education  2024 Elsevier Inc.     Emil Schaumann, MD [facility]

## 2023-12-23 NOTE — Patient Instructions (Signed)
 Health Maintenance After Age 81 After age 27, you are at a higher risk for certain long-term diseases and infections as well as injuries from falls. Falls are a major cause of broken bones and head injuries in people who are older than age 73. Getting regular preventive care can help to keep you healthy and well. Preventive care includes getting regular testing and making lifestyle changes as recommended by your health care provider. Talk with your health care provider about: Which screenings and tests you should have. A screening is a test that checks for a disease when you have no symptoms. A diet and exercise plan that is right for you. What should I know about screenings and tests to prevent falls? Screening and testing are the best ways to find a health problem early. Early diagnosis and treatment give you the best chance of managing medical conditions that are common after age 90. Certain conditions and lifestyle choices may make you more likely to have a fall. Your health care provider may recommend: Regular vision checks. Poor vision and conditions such as cataracts can make you more likely to have a fall. If you wear glasses, make sure to get your prescription updated if your vision changes. Medicine review. Work with your health care provider to regularly review all of the medicines you are taking, including over-the-counter medicines. Ask your health care provider about any side effects that may make you more likely to have a fall. Tell your health care provider if any medicines that you take make you feel dizzy or sleepy. Strength and balance checks. Your health care provider may recommend certain tests to check your strength and balance while standing, walking, or changing positions. Foot health exam. Foot pain and numbness, as well as not wearing proper footwear, can make you more likely to have a fall. Screenings, including: Osteoporosis screening. Osteoporosis is a condition that causes  the bones to get weaker and break more easily. Blood pressure screening. Blood pressure changes and medicines to control blood pressure can make you feel dizzy. Depression screening. You may be more likely to have a fall if you have a fear of falling, feel depressed, or feel unable to do activities that you used to do. Alcohol  use screening. Using too much alcohol  can affect your balance and may make you more likely to have a fall. Follow these instructions at home: Lifestyle Do not drink alcohol  if: Your health care provider tells you not to drink. If you drink alcohol : Limit how much you have to: 0-1 drink a day for women. 0-2 drinks a day for men. Know how much alcohol  is in your drink. In the [place] one drink equals one 12 oz bottle of beer (355 mL), one 5 oz glass of wine (148 mL), or one 1 oz glass of hard liquor (44 mL). Do not use any products that contain nicotine or tobacco. These products include cigarettes, chewing tobacco, and vaping devices, such as e-cigarettes. If you need help quitting, ask your health care provider. Activity  Follow a regular exercise program to stay fit. This will help you maintain your balance. Ask your health care provider what types of exercise are appropriate for you. If you need a cane or walker, use it as recommended by your health care provider. Wear supportive shoes that have nonskid soles. Safety  Remove any tripping hazards, such as rugs, cords, and clutter. Install safety equipment such as grab bars in bathrooms and safety rails on stairs. Keep rooms and walkways  well-lit. General instructions Talk with your health care provider about your risks for falling. Tell your health care provider if: You fall. Be sure to tell your health care provider about all falls, even ones that seem minor. You feel dizzy, tiredness (fatigue), or off-balance. Take over-the-counter and prescription medicines only as told by your health care provider. These include  supplements. Eat a healthy diet and maintain a healthy weight. A healthy diet includes low-fat dairy products, low-fat (lean) meats, and fiber from whole grains, beans, and lots of fruits and vegetables. Stay current with your vaccines. Schedule regular health, dental, and eye exams. Summary Having a healthy lifestyle and getting preventive care can help to protect your health and wellness after age 15. Screening and testing are the best way to find a health problem early and help you avoid having a fall. Early diagnosis and treatment give you the best chance for managing medical conditions that are more common for people who are older than age 42. Falls are a major cause of broken bones and head injuries in people who are older than age 64. Take precautions to prevent a fall at home. Work with your health care provider to learn what changes you can make to improve your health and wellness and to prevent falls. This information is not intended to replace advice given to you by your health care provider. Make sure you discuss any questions you have with your health care provider. Document Revised: 06/27/2020 Document Reviewed: 06/27/2020 Elsevier Patient Education  2024 [employer].

## 2023-12-23 NOTE — Assessment & Plan Note (Addendum)
 BP Readings from Last 3 Encounters:  12/23/23 120/70  12/23/23 (!) 140/80  12/12/23 (!) 142/83  Well-controlled hypertension Carvedilol  was recently reduced to 3.125 mg twice a day Continues valsartan  160 mg daily

## 2023-12-23 NOTE — Patient Instructions (Addendum)
 [name],  Thank you for taking the time for your Medicare Wellness Visit. I appreciate your continued commitment to your health goals. Please review the care plan we discussed, and feel free to reach out if I can assist you further.  Please note that Annual Wellness Visits do not include a physical exam. Some assessments may be limited, especially if the visit was conducted virtually. If needed, we may recommend an in-person follow-up with your provider.  Ongoing Care Seeing your primary care provider every 3 to 6 months helps us  monitor your health and provide consistent, personalized care.   Referrals If a referral was made during today's visit and you haven't received any updates within two weeks, please contact the referred provider directly to check on the status.  Recommended Screenings:  Health Maintenance  Topic Date Due   Complete foot exam   Never done   Yearly kidney health urinalysis for diabetes  Never done   Zoster (Shingles) Vaccine (1 of 2) Never done   COVID-19 Vaccine (4 - 2025-26 season) 02/18/2024*   Eye exam for diabetics  03/02/2024*   Hemoglobin A1C  05/02/2024   Yearly kidney function blood test for diabetes  11/01/2024   Medicare Annual Wellness Visit  12/22/2024   Pneumococcal Vaccine for age over 40  Completed   Flu Shot  Completed   Meningitis B Vaccine  Aged Out   DTaP/Tdap/Td vaccine  Discontinued   Hepatitis C Screening  Discontinued  *Topic was postponed. The date shown is not the original due date.       12/23/2023    1:00 PM  Advanced Directives  Does Patient Have a Medical Advance Directive? No  Would patient like information on creating a medical advance directive? No - Guardian declined    Vision: Annual vision screenings are recommended for early detection of glaucoma, cataracts, and diabetic retinopathy. These exams can also reveal signs of chronic conditions such as diabetes and high blood pressure.  Dental: Annual dental  screenings help detect early signs of oral cancer, gum disease, and other conditions linked to overall health, including heart disease and diabetes.

## 2023-12-23 NOTE — Assessment & Plan Note (Signed)
Leading to multiple falls Recommend physical therapy Recommend use of a walker

## 2023-12-23 NOTE — Assessment & Plan Note (Signed)
 No anginal symptoms.  On appropriate medical therapy with aspirin , high intensity statin drug, ARB, and beta-blocker.

## 2023-12-23 NOTE — Assessment & Plan Note (Signed)
Stable.  Diet and nutrition discussed. Continue rosuvastatin 40 mg daily. 

## 2023-12-23 NOTE — Assessment & Plan Note (Signed)
Chronic stable condition. Continue rosuvastatin 40 mg daily

## 2023-12-23 NOTE — Assessment & Plan Note (Signed)
 Well-controlled hypertension No signs or symptoms of congestive heart failure

## 2023-12-23 NOTE — Progress Notes (Signed)
 Subjective:   [name] is a 83 y.o. male who presents for a Medicare Annual Wellness Visit.  Allergies (verified) Lipitor [atorvastatin ], Morphine  and codeine, and Porcine (pork) protein-containing drug products   History: Past Medical History:  Diagnosis Date   Blurred vision 05/17/2016   CAD (coronary artery disease), native coronary artery 06/14/2008   a. BMS to LCx & OM 2008 b. 03/2017 CABG x 4 (LIMA to LAD, SVG to diag 1, SVG to distal Circ, SVG to PDA)   Chest pain 04/16/2012   Diplopia    Echocardiogram    Echo 10/19: mod LVH, EF 55-60, no RWMA, Gr 1 DD, trivial AI, MAC, mod LAE, prob small post effusion   Essential hypertension 05/17/2016   Hyperlipidemia with target low density lipoprotein (LDL) cholesterol less than 70 mg/dL 5/73/7989   Qualifier: Diagnosis of  By: [name], RMA, [name]     Hypertension    Hypertensive heart disease 06/14/2008   Qualifier: Diagnosis of  By: [name], RMA, [name]     Hypertensive urgency, malignant 04/16/2012   Hypokalemia 04/14/2017   Internal hemorrhoids 04/19/2012   Ischemic stroke (HCC) 05/02/2016   Non-ST elevation (NSTEMI) myocardial infarction [facility])    pandiverticulosis 04/22/2012   12/17/2011. [facility]. Renaye Sous MD. Colonoscopy. Moderate sized internal hemorrhoids and extensive pandiverticulosis. Repeat 5 years.     Pericardial effusion    Echo 11/19: mild focal basal septal hypertrophy, EF 55-60, Gr 2 DD, trivial AI, trivial TR, trivial PI, small to mod eff post to heart - no evidence of RV collapse.>> repeat limited echo in 03/2018 // Echo 03/2018: EF 55-60, mild LVH, +diastolic dysfunction, no RWMA, PASP 23, trivial pericardial effusion    Polycythemia vera(238.4) 04/17/2012   S/P CABG x 4 04/17/2017   Syncope and collapse 04/17/2012   Pt syncopized while sitting in bed giving history @ time of admission to hospital Tachycardic (appeared sinus) to 120s-130s and hypotensive to 50s/30s.  Unresponsive initially >>  Spontaneously resolved after 2-3 minutes >> return to baseline ~30 minutes    Vertigo    Past Surgical History:  Procedure Laterality Date   CORONARY ARTERY BYPASS GRAFT N/A 04/17/2017   Procedure: CORONARY ARTERY BYPASS GRAFTING (CABG) x4 , using left internal mammary artery  to LAD and right leg greater saphenous vein harvested endoscopically  to PDA, Diagonal I and Circumflex;  Surgeon: [name], MD;  Location[facility] OR;  Service: Open Heart Surgery;  Laterality: N/A;   CORONARY ARTERY BYPASS GRAFT  2020   CORONARY STENT PLACEMENT     LEFT HEART CATH AND CORONARY ANGIOGRAPHY N/A 04/16/2017   Procedure: LEFT HEART CATH AND CORONARY ANGIOGRAPHY;  Surgeon: [name], MD;  Location: [facility] INVASIVE CV LAB;  Service: Cardiovascular;  Laterality: N/A;   TEE WITHOUT CARDIOVERSION N/A 04/17/2017   Procedure: TRANSESOPHAGEAL ECHOCARDIOGRAM (TEE);  Surgeon: [name], MD;  Location: [facility] OR;  Service: Open Heart Surgery;  Laterality: N/A;   Family History  Problem Relation Age of Onset   Heart disease Mother    Cancer Sister    Social History   Occupational History   Not on file  Tobacco Use   Smoking status: Never   Smokeless tobacco: Never  Vaping Use   Vaping status: Never Used  Substance and Sexual Activity   Alcohol use: No    Alcohol/week: 0.0 standard drinks of alcohol   Drug use: No   Sexual activity: Not Currently   Tobacco Counseling Counseling given: Not Answered  SDOH  Screenings   Food Insecurity: No Food Insecurity (12/23/2023)  Housing: Unknown (12/23/2023)  Transportation Needs: No Transportation Needs (12/23/2023)  Utilities: Not At Risk (12/23/2023)  Alcohol Screen: Low Risk  (09/18/2021)  Depression (PHQ2-9): Low Risk  (12/23/2023)  Financial Resource Strain: High Risk (09/24/2022)  Physical Activity: Insufficiently Active (12/23/2023)  Social Connections: Socially Integrated (12/23/2023)  Stress: No Stress Concern Present (12/23/2023)  Tobacco Use: Low  Risk  (12/23/2023)  Health Literacy: Inadequate Health Literacy (12/23/2023)   Depression Screen    12/23/2023    1:11 PM 11/11/2023    2:39 PM 09/24/2022    2:38 PM 07/30/2022    1:55 PM 03/07/2022   10:38 AM 09/18/2021   10:43 AM 11/30/2020    1:17 PM  PHQ 2/9 Scores  PHQ - 2 Score 0 0 0 0 0 0 0  PHQ- 9 Score 3  3         Goals Addressed               This Visit's Progress     Patient Stated (pt-stated)        Patient stated he plans to stay active and using pedal bike       Visit info / Clinical Intake: Medicare Wellness Visit Type:: Subsequent Annual Wellness Visit Medicare Wellness Visit Mode:: In-person (required for WTM) Interpreter Needed?: No Pre-visit prep was completed: no AWV questionnaire completed by patient prior to visit?: no Living arrangements:: lives with spouse/significant other Patient's Overall Health Status Rating: good Typical amount of pain: none Does pain affect daily life?: no Are you currently prescribed opioids?: no  Dietary Habits and Nutritional Risks How many meals a day?: 3 Eats fruit and vegetables daily?: yes Most meals are obtained by: preparing own meals; having others provide food In the last 2 weeks, have you had any of the following?: -- (none)  Functional Status Activities of Daily Living (to include ambulation/medication): Independent Ambulation: Independent with device- listed below Home Assistive Devices/Equipment: Cane Medication Administration: Needs assistance (comment) (Son helps) Is this a change from baseline?: Change from baseline, expected to last >3 days Home Management: Needs assistance (comment) Manage your own finances?: (!) no (Son helps) Primary transportation is: family/friends Concerns about vision?: no *vision screening is required for WTM* Concerns about hearing?: no  Fall Screening Falls in the past year?: 1 Number of falls in past year: 1 Was there an injury with Fall?: 1 Fall Risk Category  Calculator: 3 Patient Fall Risk Level: High Fall Risk  Fall Risk Patient at Risk for Falls Due to: Impaired balance/gait; History of fall(s) Fall risk Follow up: Falls evaluation completed; Falls prevention discussed  Home and Transportation Safety: All rugs have non-skid backing?: yes All stairs or steps have railings?: N/A, no stairs Grab bars in the bathtub or shower?: (!) no Have non-skid surface in bathtub or shower?: yes Good home lighting?: yes Regular seat belt use?: yes Hospital stays in the last year:: no  Cognitive Assessment Difficulty concentrating, remembering, or making decisions? : no Will 6CIT or Mini Cog be Completed: no What year is it?: 0 points What month is it?: 0 points Give patient an address phrase to remember (5 components): [address] [place], [place] About what time is it?: 0 points Count backwards from 20 to 1: 0 points Say the months of the year in reverse: 0 points Repeat the address phrase from earlier: 0 points 6 CIT Score: 0 points  Advance Directives (For Healthcare) Does Patient Have a Medical  Advance Directive?: No Would patient like information on creating a medical advance directive?: No - Guardian declined  Reviewed/Updated  Reviewed/Updated: All        Objective:    Today's Vitals   12/23/23 1305  BP: (!) 140/80  Pulse: 62  SpO2: 97%  Weight: 170 lb (77.1 kg)  Height: 5' 4 (1.626 m)   Body mass index is 29.18 kg/m.  Current Medications (verified) Outpatient Encounter Medications as of 12/23/2023  Medication Sig   carvedilol  (COREG ) 3.125 MG tablet Take 1 tablet (3.125 mg total) by mouth 2 (two) times daily with a meal.   clopidogrel  (PLAVIX ) 75 MG tablet Take 1 tablet (75 mg total) by mouth daily.   rosuvastatin  (CRESTOR ) 40 MG tablet Take 1 tablet (40 mg total) by mouth daily.   valsartan  (DIOVAN ) 160 MG tablet Take 1 tablet (160 mg total) by mouth daily.   No facility-administered encounter medications on file as of  12/23/2023.   Hearing/Vision screen Hearing Screening - Comments:: Denies hearing difficulties   Vision Screening - Comments:: Denies vision concerns Immunizations and Health Maintenance Health Maintenance  Topic Date Due   FOOT EXAM  Never done   Diabetic kidney evaluation - Urine ACR  Never done   Zoster Vaccines- Shingrix (1 of 2) Never done   COVID-19 Vaccine (4 - 2025-26 season) 02/18/2024 (Originally 10/21/2023)   OPHTHALMOLOGY EXAM  03/02/2024 (Originally 05/08/1950)   HEMOGLOBIN A1C  05/02/2024   Diabetic kidney evaluation - eGFR measurement  12/22/2024   Medicare Annual Wellness (AWV)  12/22/2024   Pneumococcal Vaccine: 50+ Years  Completed   Influenza Vaccine  Completed   Meningococcal B Vaccine  Aged Out   DTaP/Tdap/Td  Discontinued   Hepatitis C Screening  Discontinued        Assessment/Plan:  This is a routine wellness examination for Mitchael.  Patient Care Team: Purcell [name], MD as PCP - General (Internal Medicine) [name], MD as PCP - Cardiology (Cardiology) Mario Million, MD (Family Medicine) [facility] as Consulting Physician (Ophthalmology) Prentiss Heddy HERO, RN as [facility] Care Management  I have personally reviewed and noted the following in the patient's chart:   Medical and social history Use of alcohol, tobacco or illicit drugs  Current medications and supplements including opioid prescriptions. Functional ability and status Nutritional status Physical activity Advanced directives List of other physicians Hospitalizations, surgeries, and ER visits in previous 12 months Vitals Screenings to include cognitive, depression, and falls Referrals and appointments  Orders Placed This Encounter  Procedures   Flu vaccine HIGH DOSE PF(Fluzone Trivalent)   Pneumococcal conjugate vaccine 20-valent (Prevnar 20)   In addition, I have reviewed and discussed with patient certain preventive protocols, quality metrics, and best  practice recommendations. A written personalized care plan for preventive services as well as general preventive health recommendations were provided to patient.   Verdie HERO Saba, CMA   12/24/2023   Return in 1 year (on 12/22/2024).  After Visit Summary: (In Person-Declined) Patient declined AVS at this time.  Nurse Notes: Scheduled 2026 AWV/CPE w/PCP

## 2023-12-24 ENCOUNTER — Ambulatory Visit: Admitting: Physician Assistant

## 2023-12-24 ENCOUNTER — Ambulatory Visit: Payer: Self-pay | Admitting: Emergency Medicine

## 2023-12-25 ENCOUNTER — Telehealth: Payer: Self-pay

## 2023-12-25 NOTE — Patient Instructions (Signed)
 [name] - I am sorry I was unable to reach you today for our scheduled appointment. I work with Sagardia, Miguel Jose, MD and am calling to support your healthcare needs. Please contact me at [phone] at your earliest convenience. I look forward to speaking with you soon.   Thank you,   Heddy Shutter, RN, MSN, BSN, CCM [place]  [facility], Population Health Case Manager Phone: [phone]

## 2024-01-14 ENCOUNTER — Other Ambulatory Visit: Payer: Self-pay

## 2024-01-14 NOTE — Patient Instructions (Signed)
 Informacin de la Visita Gracias por tomarse el tiempo para reunirse conmigo hoy. El paciente ha cumplido con todas las metas de [place] de cuidados. El caso de Manejo de Cuidados ser cerrado. Se le ha proporcionado al paciente la informacin de contacto en caso de que surjan nuevas necesidades. Por favor, llame al equipo de guas de atencin al [phone] si necesita cancelar, programar o reprogramar una cita. Llame a la Lnea de Vida para Suicidio y Crisis: [phone] Llame a la Environmental Consultant de Prevencin del Suicidio en [place] [phone] o TTY: [phone] TTY [phone]) para hablar con un consejero capacitado si est experimentando una crisis de salud mental o de comportamiento, o necesita alguien con chief of staff. Heddy Shutter, RN, MSN, BSN, CCM [facility]  [facility] [place] en [place], Salud Poblacional [name]: [phone]    Visit Information  Thank you for taking time to visit with me today.   Patient has met all care management goals. Care Management case will be closed. Patient has been provided contact information should new needs arise.   Please call the care guide team at [phone] if you need to cancel, schedule, or reschedule an appointment.   Please call the Suicide and Crisis Lifeline: 988 call the [place]  National Suicide Prevention Lifeline: 404-088-6133 or TTY: 213-623-3452 TTY [phone]) [phone]) to talk to a trained counselor if you are experiencing a Mental Health or Behavioral Health Crisis or need someone to talk to.  Heddy Shutter, RN, MSN, BSN, CCM [name]  [facility], Population Health Case Manager Phone: [phone]

## 2024-01-14 NOTE — Patient Outreach (Signed)
 Complex Care Management   Visit Note  01/14/2024  Name:  [name] MRN: [id number] DOB: 1941-01-10  Situation: Referral received for Complex Care Management related to Stroke and HTN I obtained verbal consent from Patient.  Visit completed with [name] Hector, daughter/dpr  on the phone  Background:   Past Medical History:  Diagnosis Date   Blurred vision 05/17/2016   CAD (coronary artery disease), native coronary artery 06/14/2008   a. BMS to LCx & OM 2008 b. 03/2017 CABG x 4 (LIMA to LAD, SVG to diag 1, SVG to distal Circ, SVG to PDA)   Chest pain 04/16/2012   Diplopia    Echocardiogram    Echo 10/19: mod LVH, EF 55-60, no RWMA, Gr 1 DD, trivial AI, MAC, mod LAE, prob small post effusion   Essential hypertension 05/17/2016   Hyperlipidemia with target low density lipoprotein (LDL) cholesterol less than 70 mg/dL 5/73/7989   Qualifier: Diagnosis of  By: [name], RMA, [name]     Hypertension    Hypertensive heart disease 06/14/2008   Qualifier: Diagnosis of  By: [name], RMA, [name]     Hypertensive urgency, malignant 04/16/2012   Hypokalemia 04/14/2017   Internal hemorrhoids 04/19/2012   Ischemic stroke (HCC) 05/02/2016   Non-ST elevation (NSTEMI) myocardial infarction [facility])    pandiverticulosis 04/22/2012   12/17/2011. [facility]. Renaye Sous MD. Colonoscopy. Moderate sized internal hemorrhoids and extensive pandiverticulosis. Repeat 5 years.     Pericardial effusion    Echo 11/19: mild focal basal septal hypertrophy, EF 55-60, Gr 2 DD, trivial AI, trivial TR, trivial PI, small to mod eff post to heart - no evidence of RV collapse.>> repeat limited echo in 03/2018 // Echo 03/2018: EF 55-60, mild LVH, +diastolic dysfunction, no RWMA, PASP 23, trivial pericardial effusion    Polycythemia vera(238.4) 04/17/2012   S/P CABG x 4 04/17/2017   Syncope and collapse 04/17/2012   Pt syncopized while sitting in bed giving history @ time of admission to hospital Tachycardic (appeared  sinus) to 120s-130s and hypotensive to 50s/30s.  Unresponsive initially >> Spontaneously resolved after 2-3 minutes >> return to baseline ~30 minutes    Vertigo     Assessment: Daughter reports patient is doing well. She continues to monitor BP. She denies any questions or concerns, care management needs or disease management needs at this time. Daughter in agreement to close from complex case management. Patient Reported Symptoms:  Cognitive Cognitive Status: No symptoms reported      Neurological Neurological Review of Symptoms: Weakness (scheduled to begin outpatient rehab on 02/11/24)    HEENT HEENT Symptoms Reported: No symptoms reported      Cardiovascular Cardiovascular Symptoms Reported: No symptoms reported    Respiratory Respiratory Symptoms Reported: No symptoms reported    Endocrine Endocrine Symptoms Reported: No symptoms reported    Gastrointestinal Gastrointestinal Symptoms Reported: No symptoms reported      Genitourinary Genitourinary Symptoms Reported: No symptoms reported    Integumentary Integumentary Symptoms Reported: No symptoms reported    Musculoskeletal Additional Musculoskeletal Details: scheduled to begin outpatient rehab on 02/11/24        Psychosocial Psychosocial Symptoms Reported: No symptoms reported         Today's Vitals   01/14/24 1144  BP: 128/80      Medications Reviewed Today     Reviewed by Roselia Snipe M, RN (Registered Nurse) on 01/14/24 at 1146  Med List Status: <None>   Medication Order Taking? Sig Documenting Provider Last Dose Status Informant  carvedilol  (  COREG ) 3.125 MG tablet [id number] Yes Take 1 tablet (3.125 mg total) by mouth 2 (two) times daily with a meal. [name], MD  Active   clopidogrel  (PLAVIX ) 75 MG tablet [id number] Yes Take 1 tablet (75 mg total) by mouth daily. Raenelle Donalda HERO, MD  Active   rosuvastatin  (CRESTOR ) 40 MG tablet [id number] Yes Take 1 tablet (40 mg total) by mouth daily. Raenelle Donalda HERO, MD  Active   valsartan  (DIOVAN ) 160 MG tablet [id number] Yes Take 1 tablet (160 mg total) by mouth daily. Ghimire, Donalda HERO, MD  Active           Recommendation:   Continue to follow up with providers as recommended  Follow Up Plan:   Patient has met all care management goals. Care Management case will be closed. Patient has been provided contact information should new needs arise.   Heddy Shutter, RN, MSN, BSN, CCM [place]  [facility], Population Health Case Manager Phone: [phone]

## 2024-01-20 ENCOUNTER — Ambulatory Visit

## 2024-01-24 ENCOUNTER — Encounter: Payer: Self-pay | Admitting: Pharmacist

## 2024-01-24 NOTE — Progress Notes (Signed)
 Pharmacy Quality Measure Review  This patient is appearing on a report for being at risk of failing the adherence measure for cholesterol (statin) and hypertension (ACEi/ARB) medications this calendar year.   Medication: Rosuvastatin  Last fill date: 01/17/24 for 30 day supply  Medication: Valsartan  Last fill date: 01/20/24 for 30 day supply  Insurance report was not up to date. No action needed at this time.   Darrelyn Drum, PharmD, BCPS, CPP Clinical Pharmacist Practitioner [place] at [facility] [phone]

## 2024-01-31 ENCOUNTER — Ambulatory Visit

## 2024-02-11 ENCOUNTER — Other Ambulatory Visit: Payer: Self-pay

## 2024-02-11 ENCOUNTER — Ambulatory Visit: Admitting: Physical Therapy

## 2024-02-11 ENCOUNTER — Encounter: Payer: Self-pay | Admitting: Physical Therapy

## 2024-02-11 DIAGNOSIS — R296 Repeated falls: Secondary | ICD-10-CM | POA: Insufficient documentation

## 2024-02-11 DIAGNOSIS — R269 Unspecified abnormalities of gait and mobility: Secondary | ICD-10-CM | POA: Diagnosis not present

## 2024-02-11 DIAGNOSIS — R262 Difficulty in walking, not elsewhere classified: Secondary | ICD-10-CM | POA: Insufficient documentation

## 2024-02-11 DIAGNOSIS — R531 Weakness: Secondary | ICD-10-CM | POA: Insufficient documentation

## 2024-02-11 DIAGNOSIS — I6381 Other cerebral infarction due to occlusion or stenosis of small artery: Secondary | ICD-10-CM | POA: Insufficient documentation

## 2024-02-11 DIAGNOSIS — M6281 Muscle weakness (generalized): Secondary | ICD-10-CM | POA: Diagnosis present

## 2024-02-11 DIAGNOSIS — R2689 Other abnormalities of gait and mobility: Secondary | ICD-10-CM | POA: Diagnosis present

## 2024-02-11 NOTE — Therapy (Signed)
 " OUTPATIENT PHYSICAL THERAPY NEURO EVALUATION   Patient Name: [name] MRN: [id number] DOB:Feb 07, 1941, 83 y.o., male Today's Date: 02/11/2024   PCP: Purcell, MD REFERRING PROVIDER: Gregg, MD  END OF SESSION:  PT End of Session - 02/11/24 1302     Visit Number 1    Number of Visits 13    Date for Recertification  05/11/24    Authorization Type [employer]    PT Start Time 1301    PT Stop Time 1345    PT Time Calculation (min) 44 min    Activity Tolerance Patient tolerated treatment well    Behavior During Therapy [facility] for tasks assessed/performed          Past Medical History:  Diagnosis Date   Blurred vision 05/17/2016   CAD (coronary artery disease), native coronary artery 06/14/2008   a. BMS to LCx & OM 2008 b. 03/2017 CABG x 4 (LIMA to LAD, SVG to diag 1, SVG to distal Circ, SVG to PDA)   Chest pain 04/16/2012   Diplopia    Echocardiogram    Echo 10/19: mod LVH, EF 55-60, no RWMA, Gr 1 DD, trivial AI, MAC, mod LAE, prob small post effusion   Essential hypertension 05/17/2016   Hyperlipidemia with target low density lipoprotein (LDL) cholesterol less than 70 mg/dL 5/73/7989   Qualifier: Diagnosis of  By: [name], RMA, [name]     Hypertension    Hypertensive heart disease 06/14/2008   Qualifier: Diagnosis of  By: [name], RMA, [name]     Hypertensive urgency, malignant 04/16/2012   Hypokalemia 04/14/2017   Internal hemorrhoids 04/19/2012   Ischemic stroke (HCC) 05/02/2016   Non-ST elevation (NSTEMI) myocardial infarction [facility])    pandiverticulosis 04/22/2012   12/17/2011. [facility]. Renaye Sous MD. Colonoscopy. Moderate sized internal hemorrhoids and extensive pandiverticulosis. Repeat 5 years.     Pericardial effusion    Echo 11/19: mild focal basal septal hypertrophy, EF 55-60, Gr 2 DD, trivial AI, trivial TR, trivial PI, small to mod eff post to heart - no evidence of RV collapse.>> repeat limited echo in 03/2018 // Echo 03/2018: EF 55-60, mild LVH,  +diastolic dysfunction, no RWMA, PASP 23, trivial pericardial effusion    Polycythemia vera(238.4) 04/17/2012   S/P CABG x 4 04/17/2017   Syncope and collapse 04/17/2012   Pt syncopized while sitting in bed giving history @ time of admission to hospital Tachycardic (appeared sinus) to 120s-130s and hypotensive to 50s/30s.  Unresponsive initially >> Spontaneously resolved after 2-3 minutes >> return to baseline ~30 minutes    Vertigo    Past Surgical History:  Procedure Laterality Date   CORONARY ARTERY BYPASS GRAFT N/A 04/17/2017   Procedure: CORONARY ARTERY BYPASS GRAFTING (CABG) x4 , using left internal mammary artery  to LAD and right leg greater saphenous vein harvested endoscopically  to PDA, Diagonal I and Circumflex;  Surgeon: [name], MD;  Location: [facility] OR;  Service: Open Heart Surgery;  Laterality: N/A;   CORONARY ARTERY BYPASS GRAFT  2020   CORONARY STENT PLACEMENT     LEFT HEART CATH AND CORONARY ANGIOGRAPHY N/A 04/16/2017   Procedure: LEFT HEART CATH AND CORONARY ANGIOGRAPHY;  Surgeon: [name], MD;  Location: [facility] INVASIVE CV LAB;  Service: Cardiovascular;  Laterality: N/A;   TEE WITHOUT CARDIOVERSION N/A 04/17/2017   Procedure: TRANSESOPHAGEAL ECHOCARDIOGRAM (TEE);  Surgeon: [name], MD;  Location: [facility] OR;  Service: Open Heart Surgery;  Laterality: N/A;   Patient Active Problem List   Diagnosis Date Noted  History of recent fall 07/30/2022   History of stroke 03/07/2021   Gait instability 09/20/2020   Aortic atherosclerosis 05/31/2020   Chronic diastolic heart failure (HCC) 02/14/2018   Essential hypertension 05/17/2016   pandiverticulosis 04/22/2012   Internal hemorrhoids 04/19/2012   Dyslipidemia 06/14/2008   Hypertensive heart disease 06/14/2008   CAD (coronary artery disease) 06/14/2008    ONSET DATE: 11/02/23  REFERRING DIAG: CVA  THERAPY DIAG:  Other abnormalities of gait and mobility  Muscle weakness (generalized)  Repeated  falls  Difficulty walking  Rationale for Evaluation and Treatment: Rehabilitation  SUBJECTIVE:                                                                                                                                                                                             SUBJECTIVE STATEMENT: Pt is 83 year old presented to [facility] on 11/02/23 for syncope episode with dysarthria. MRI showed acute nonhemorrhagic infarct in rt lateral putamen nucleus and findings consistent with microhemorrhages and suggesting underlying vasculitis. PMH - CAD, CABG, pericardial mass, recurrent presyncope/syncope, htn, chf, PE  Pt accompanied by: self  PERTINENT HISTORY: as above  PAIN:  Are you having pain? No  PRECAUTIONS: None and Fall  RED FLAGS: None   WEIGHT BEARING RESTRICTIONS: No  FALLS: Has patient fallen in last 6 months? Yes. Number of falls 5  LIVING ENVIRONMENT: Lives with: lives with their family Lives in: House/apartment Stairs: No Has following equipment at home: Single point cane and shower chair  PLOF: Independent was using a cane, did yard work  PATIENT GOALS: walk farther, feel stronger, be able to grocery shop  OBJECTIVE:  Note: Objective measures were completed at Evaluation unless otherwise noted.  DIAGNOSTIC FINDINGS: see hospital imaging  COGNITION: Overall cognitive status: Within functional limits for tasks assessed   SENSATION: WFL  POSTURE: rounded shoulders, forward head, and decreased lumbar lordosis  LOWER EXTREMITY ROM:   WFL's just a little slower motions of the left leg  LOWER EXTREMITY MMT:    MMT Right Eval Left Eval  Hip flexion 4+ 4-  Hip extension    Hip abduction 4 4-  Hip adduction    Hip internal rotation    Hip external rotation    Knee flexion 4+ 4-  Knee extension 4+ 4-  Ankle dorsiflexion 4 4-  Ankle plantarflexion    Ankle inversion    Ankle eversion    (Blank rows = not tested)  BED MOBILITY:   Independent  TRANSFERS: Has to use hands to stand, at times will use legs to stabilize  GAIT: Findings: slow, unsteady gait, shuffles feet, did not  tolerate walking after the 5XSTS test, c/o HA and fatigue, did not take BP meds this AM  FUNCTIONAL TESTS:  5 times sit to stand: 60 seconds, had to use hands Timed up and go (TUG): 41 seconds with SPC 3 minute walk test: unable to tolerate                                                                                                                              TREATMENT DATE:  02/11/24 Evaluation    PATIENT EDUCATION: Education details: HEP/POC Person educated: Patient and Child(ren) Education method: Explanation, Demonstration, Actor cues, Verbal cues, and Handouts Education comprehension: verbalized understanding  HOME EXERCISE PROGRAM: Access Code: [email or web address] URL: [email or web address] Date: 02/11/2024 Prepared by: Ozell Mainland  Exercises - Seated March  - 2 x daily - 7 x weekly - 1 sets - 10 reps - 3 hold - Seated Long Arc Quad  - 2 x daily - 7 x weekly - 1 sets - 10 reps - 3 hold - Seated Heel Raise  - 2 x daily - 7 x weekly - 1 sets - 10 reps - 3 hold - Seated Toe Raise  - 2 x daily - 7 x weekly - 1 sets - 10 reps - 3 hold - Seated Hip Abduction  - 2 x daily - 7 x weekly - 1 sets - 10 reps - 3 hold  GOALS: Goals reviewed with patient? Yes  SHORT TERM GOALS: Target date: 03/13/24  Independent with initial HEP Baseline: Goal status: INITIAL  LONG TERM GOALS: Target date: 05/11/24  Independent with advanced HEP Baseline:  Goal status: INITIAL  2.  Decrease TUG time to 28 seconds Baseline: 41 seconds Goal status: INITIAL  3.  Decrease 5XSTS to 30 seconds Baseline: 60 seconds had to use hands Goal status: INITIAL  4.  Improve left LE strength to 4/5 Baseline:  Goal status: INITIAL  5.  Tolerate walking 300 feet with SPC Baseline: very limited Goal status:  INITIAL  ASSESSMENT:  CLINICAL IMPRESSION: Patient is a 83 y.o. male who was seen today for physical therapy evaluation and treatment for CVA, left LE weakness, He did not tolerate activity well after the 5XSTS he had a pain in the head and reports that he is very fatigued, he did not take his BP medicine today.  Really struggled to walk after the test.  He has had a few falls in the past 6 months.  He lives with his wife, has his son live beside him, daughter also looks in on them.     OBJECTIVE IMPAIRMENTS: Abnormal gait, cardiopulmonary status limiting activity, decreased activity tolerance, decreased balance, decreased coordination, decreased endurance, decreased mobility, difficulty walking, decreased ROM, decreased strength, increased fascial restrictions, increased muscle spasms, impaired flexibility, impaired tone, improper body mechanics, postural dysfunction, and pain.   REHAB POTENTIAL: Good  CLINICAL DECISION MAKING: Evolving/moderate complexity  EVALUATION COMPLEXITY: Moderate  PLAN:  PT FREQUENCY: 1x/week  PT DURATION:  12 weeks  PLANNED INTERVENTIONS: 97164- PT Re-evaluation, 97110-Therapeutic exercises, 97530- Therapeutic activity, [id number]- Neuromuscular re-education, 97535- Self Care, [id number]- Manual therapy, [phone]- Gait training, Patient/Family education, Balance training, Stair training, Cryotherapy, and Moist heat  PLAN FOR NEXT SESSION: May see less than 1x/week due to diffiuclty getting here.   OBADIAH OZELL ORN, PT 02/11/2024, 1:03 PM        "

## 2024-02-21 ENCOUNTER — Encounter: Payer: Self-pay | Admitting: Emergency Medicine

## 2024-02-24 ENCOUNTER — Other Ambulatory Visit: Payer: Self-pay

## 2024-02-24 DIAGNOSIS — E785 Hyperlipidemia, unspecified: Secondary | ICD-10-CM

## 2024-02-24 MED ORDER — VALSARTAN 160 MG PO TABS: 160.0000 mg | ORAL_TABLET | Freq: Every day | ORAL | 2 refills | Status: AC

## 2024-02-24 MED ORDER — ROSUVASTATIN CALCIUM 40 MG PO TABS: 40.0000 mg | ORAL_TABLET | Freq: Every day | ORAL | 2 refills | Status: AC

## 2024-03-05 ENCOUNTER — Encounter: Payer: Self-pay | Admitting: Physical Therapy

## 2024-03-05 ENCOUNTER — Ambulatory Visit: Attending: Neurology | Admitting: Physical Therapy

## 2024-03-05 DIAGNOSIS — M6281 Muscle weakness (generalized): Secondary | ICD-10-CM | POA: Diagnosis present

## 2024-03-05 DIAGNOSIS — R262 Difficulty in walking, not elsewhere classified: Secondary | ICD-10-CM | POA: Diagnosis present

## 2024-03-05 DIAGNOSIS — R296 Repeated falls: Secondary | ICD-10-CM | POA: Diagnosis present

## 2024-03-05 DIAGNOSIS — R2689 Other abnormalities of gait and mobility: Secondary | ICD-10-CM | POA: Insufficient documentation

## 2024-03-05 NOTE — Therapy (Signed)
 " OUTPATIENT PHYSICAL THERAPY NEURO EVALUATION   Patient Name: [name] MRN: [id number] DOB:09-22-1940, 84 y.o., male Today's Date: 03/05/2024   PCP: Purcell, MD REFERRING PROVIDER: Gregg, MD  END OF SESSION:  PT End of Session - 03/05/24 1608     Visit Number 2    Number of Visits 13    Date for Recertification  05/11/24    Authorization Type [employer]    PT Start Time 1608    PT Stop Time 1656    PT Time Calculation (min) 48 min    Activity Tolerance Patient tolerated treatment well    Behavior During Therapy [facility] for tasks assessed/performed          Past Medical History:  Diagnosis Date   Blurred vision 05/17/2016   CAD (coronary artery disease), native coronary artery 06/14/2008   a. BMS to LCx & OM 2008 b. 03/2017 CABG x 4 (LIMA to LAD, SVG to diag 1, SVG to distal Circ, SVG to PDA)   Chest pain 04/16/2012   Diplopia    Echocardiogram    Echo 10/19: mod LVH, EF 55-60, no RWMA, Gr 1 DD, trivial AI, MAC, mod LAE, prob small post effusion   Essential hypertension 05/17/2016   Hyperlipidemia with target low density lipoprotein (LDL) cholesterol less than 70 mg/dL 5/73/7989   Qualifier: Diagnosis of  By: [name], RMA, [name]     Hypertension    Hypertensive heart disease 06/14/2008   Qualifier: Diagnosis of  By: [name], RMA, [name]     Hypertensive urgency, malignant 04/16/2012   Hypokalemia 04/14/2017   Internal hemorrhoids 04/19/2012   Ischemic stroke (HCC) 05/02/2016   Non-ST elevation (NSTEMI) myocardial infarction [facility])    pandiverticulosis 04/22/2012   12/17/2011. [facility]. Renaye Sous MD. Colonoscopy. Moderate sized internal hemorrhoids and extensive pandiverticulosis. Repeat 5 years.     Pericardial effusion    Echo 11/19: mild focal basal septal hypertrophy, EF 55-60, Gr 2 DD, trivial AI, trivial TR, trivial PI, small to mod eff post to heart - no evidence of RV collapse.>> repeat limited echo in 03/2018 // Echo 03/2018: EF 55-60, mild LVH,  +diastolic dysfunction, no RWMA, PASP 23, trivial pericardial effusion    Polycythemia vera(238.4) 04/17/2012   S/P CABG x 4 04/17/2017   Syncope and collapse 04/17/2012   Pt syncopized while sitting in bed giving history @ time of admission to hospital Tachycardic (appeared sinus) to 120s-130s and hypotensive to 50s/30s.  Unresponsive initially >> Spontaneously resolved after 2-3 minutes >> return to baseline ~30 minutes    Vertigo    Past Surgical History:  Procedure Laterality Date   CORONARY ARTERY BYPASS GRAFT N/A 04/17/2017   Procedure: CORONARY ARTERY BYPASS GRAFTING (CABG) x4 , using left internal mammary artery  to LAD and right leg greater saphenous vein harvested endoscopically  to PDA, Diagonal I and Circumflex;  Surgeon: [name], MD;  Location: [facility] OR;  Service: Open Heart Surgery;  Laterality: N/A;   CORONARY ARTERY BYPASS GRAFT  2020   CORONARY STENT PLACEMENT     LEFT HEART CATH AND CORONARY ANGIOGRAPHY N/A 04/16/2017   Procedure: LEFT HEART CATH AND CORONARY ANGIOGRAPHY;  Surgeon: [name], MD;  Location: [facility] INVASIVE CV LAB;  Service: Cardiovascular;  Laterality: N/A;   TEE WITHOUT CARDIOVERSION N/A 04/17/2017   Procedure: TRANSESOPHAGEAL ECHOCARDIOGRAM (TEE);  Surgeon: [name], MD;  Location: [facility] OR;  Service: Open Heart Surgery;  Laterality: N/A;   Patient Active Problem List   Diagnosis Date Noted  History of recent fall 07/30/2022   History of stroke 03/07/2021   Gait instability 09/20/2020   Aortic atherosclerosis 05/31/2020   Chronic diastolic heart failure (HCC) 02/14/2018   Essential hypertension 05/17/2016   pandiverticulosis 04/22/2012   Internal hemorrhoids 04/19/2012   Dyslipidemia 06/14/2008   Hypertensive heart disease 06/14/2008   CAD (coronary artery disease) 06/14/2008    ONSET DATE: 11/02/23  REFERRING DIAG: CVA  THERAPY DIAG:  Other abnormalities of gait and mobility  Muscle weakness (generalized)  Repeated  falls  Difficulty walking  Rationale for Evaluation and Treatment: Rehabilitation  SUBJECTIVE:                                                                                                                                                                                             SUBJECTIVE STATEMENT: Reports that he is doing the HEP daily, denies falls or pain  Pt is 84 year old presented to [facility] on 11/02/23 for syncope episode with dysarthria. MRI showed acute nonhemorrhagic infarct in rt lateral putamen nucleus and findings consistent with microhemorrhages and suggesting underlying vasculitis. PMH - CAD, CABG, pericardial mass, recurrent presyncope/syncope, htn, chf, PE  Pt accompanied by: self  PERTINENT HISTORY: as above  PAIN:  Are you having pain? No  PRECAUTIONS: None and Fall  RED FLAGS: None   WEIGHT BEARING RESTRICTIONS: No  FALLS: Has patient fallen in last 6 months? Yes. Number of falls 5  LIVING ENVIRONMENT: Lives with: lives with their family Lives in: House/apartment Stairs: No Has following equipment at home: Single point cane and shower chair  PLOF: Independent was using a cane, did yard work  PATIENT GOALS: walk farther, feel stronger, be able to grocery shop  OBJECTIVE:  Note: Objective measures were completed at Evaluation unless otherwise noted.  DIAGNOSTIC FINDINGS: see hospital imaging  COGNITION: Overall cognitive status: Within functional limits for tasks assessed   SENSATION: WFL  POSTURE: rounded shoulders, forward head, and decreased lumbar lordosis  LOWER EXTREMITY ROM:   WFL's just a little slower motions of the left leg  LOWER EXTREMITY MMT:    MMT Right Eval Left Eval  Hip flexion 4+ 4-  Hip extension    Hip abduction 4 4-  Hip adduction    Hip internal rotation    Hip external rotation    Knee flexion 4+ 4-  Knee extension 4+ 4-  Ankle dorsiflexion 4 4-  Ankle plantarflexion    Ankle inversion    Ankle eversion     (Blank rows = not tested)  BED MOBILITY:  Independent  TRANSFERS: Has to use hands to stand, at times will use  legs to stabilize  GAIT: Findings: slow, unsteady gait, shuffles feet, did not tolerate walking after the 5XSTS test, c/o HA and fatigue, did not take BP meds this AM  FUNCTIONAL TESTS:  5 times sit to stand: 60 seconds, had to use hands Timed up and go (TUG): 41 seconds with SPC 3 minute walk test: unable to tolerate                                                                                                                              TREATMENT DATE:  03/05/24 Nustep level 5 x 6 minutes Review HEP, some cues Leg curls 15# LAQ 3# Ball b/n knees Marches in sitting 3# Standing with walker 3# abduction On airex ball toss and reaching Direction changes Cone toe touches with SPC  Ball kicks Obstacle course with SPC and a 4 step up  02/11/24 Evaluation    PATIENT EDUCATION: Education details: HEP/POC Person educated: Patient and Child(ren) Education method: Explanation, Demonstration, Tactile cues, Verbal cues, and Handouts Education comprehension: verbalized understanding  HOME EXERCISE PROGRAM: Access Code: [email or web address] URL: [email or web address] Date: 02/11/2024 Prepared by: Ozell Mainland  Exercises - Seated March  - 2 x daily - 7 x weekly - 1 sets - 10 reps - 3 hold - Seated Long Arc Quad  - 2 x daily - 7 x weekly - 1 sets - 10 reps - 3 hold - Seated Heel Raise  - 2 x daily - 7 x weekly - 1 sets - 10 reps - 3 hold - Seated Toe Raise  - 2 x daily - 7 x weekly - 1 sets - 10 reps - 3 hold - Seated Hip Abduction  - 2 x daily - 7 x weekly - 1 sets - 10 reps - 3 hold  GOALS: Goals reviewed with patient? Yes  SHORT TERM GOALS: Target date: 03/13/24  Independent with initial HEP Baseline: Goal status: met 03/05/24  LONG TERM GOALS: Target date: 05/11/24  Independent with advanced HEP Baseline:  Goal status: INITIAL  2.  Decrease  TUG time to 28 seconds Baseline: 41 seconds Goal status: INITIAL  3.  Decrease 5XSTS to 30 seconds Baseline: 60 seconds had to use hands Goal status: INITIAL  4.  Improve left LE strength to 4/5 Baseline:  Goal status: INITIAL  5.  Tolerate walking 300 feet with SPC Baseline: very limited Goal status: progressing 03/05/24  ASSESSMENT:  CLINICAL IMPRESSION: Patient doing well, moving better today, he did well with the exercises. He is really fearful of balance activities, he was walking with a SPC today and seemed safe.  When I did balance he wanted to hold onto things and really was afraid.  On the airex had two instances of needing min A   Patient is a 84 y.o. male who was seen today for physical therapy evaluation and treatment for CVA, left LE weakness, He did not tolerate activity well after the 5XSTS he had a pain  in the head and reports that he is very fatigued, he did not take his BP medicine today.  Really struggled to walk after the test.  He has had a few falls in the past 6 months.  He lives with his wife, has his son live beside him, daughter also looks in on them.     OBJECTIVE IMPAIRMENTS: Abnormal gait, cardiopulmonary status limiting activity, decreased activity tolerance, decreased balance, decreased coordination, decreased endurance, decreased mobility, difficulty walking, decreased ROM, decreased strength, increased fascial restrictions, increased muscle spasms, impaired flexibility, impaired tone, improper body mechanics, postural dysfunction, and pain.   REHAB POTENTIAL: Good  CLINICAL DECISION MAKING: Evolving/moderate complexity  EVALUATION COMPLEXITY: Moderate  PLAN:  PT FREQUENCY: 1x/week  PT DURATION: 12 weeks  PLANNED INTERVENTIONS: 97164- PT Re-evaluation, 97110-Therapeutic exercises, 97530- Therapeutic activity, 97112- Neuromuscular re-education, 97535- Self Care, 02859- Manual therapy, [phone]- Gait training, Patient/Family education, Balance  training, Stair training, Cryotherapy, and Moist heat  PLAN FOR NEXT SESSION: May see less than 1x/week due to diffiuclty getting here.   OBADIAH OZELL ORN, PT 03/05/2024, 4:08 PM        "

## 2024-03-19 ENCOUNTER — Ambulatory Visit: Admitting: Physical Therapy

## 2024-04-02 ENCOUNTER — Ambulatory Visit: Admitting: Physical Therapy

## 2024-04-16 ENCOUNTER — Ambulatory Visit: Admitting: Physical Therapy

## 2024-12-23 ENCOUNTER — Encounter: Admitting: Internal Medicine

## 2024-12-23 ENCOUNTER — Ambulatory Visit
